# Patient Record
Sex: Male | Born: 1965
Health system: Southern US, Community
[De-identification: ages and names within clinical notes are randomized; demographics above are authoritative.]

## PROBLEM LIST (undated history)

## (undated) DIAGNOSIS — H35039 Hypertensive retinopathy, unspecified eye: Secondary | ICD-10-CM

## (undated) DIAGNOSIS — F329 Major depressive disorder, single episode, unspecified: Secondary | ICD-10-CM

## (undated) DIAGNOSIS — G4730 Sleep apnea, unspecified: Secondary | ICD-10-CM

## (undated) DIAGNOSIS — I2699 Other pulmonary embolism without acute cor pulmonale: Secondary | ICD-10-CM

## (undated) DIAGNOSIS — F32A Depression, unspecified: Secondary | ICD-10-CM

## (undated) DIAGNOSIS — I10 Essential (primary) hypertension: Secondary | ICD-10-CM

## (undated) DIAGNOSIS — H269 Unspecified cataract: Secondary | ICD-10-CM

## (undated) DIAGNOSIS — Z9889 Other specified postprocedural states: Secondary | ICD-10-CM

## (undated) DIAGNOSIS — R112 Nausea with vomiting, unspecified: Secondary | ICD-10-CM

## (undated) DIAGNOSIS — G709 Myoneural disorder, unspecified: Secondary | ICD-10-CM

## (undated) DIAGNOSIS — M4800 Spinal stenosis, site unspecified: Secondary | ICD-10-CM

## (undated) DIAGNOSIS — I82409 Acute embolism and thrombosis of unspecified deep veins of unspecified lower extremity: Secondary | ICD-10-CM

## (undated) DIAGNOSIS — M1990 Unspecified osteoarthritis, unspecified site: Secondary | ICD-10-CM

## (undated) DIAGNOSIS — D126 Benign neoplasm of colon, unspecified: Secondary | ICD-10-CM

## (undated) DIAGNOSIS — D689 Coagulation defect, unspecified: Secondary | ICD-10-CM

## (undated) DIAGNOSIS — I739 Peripheral vascular disease, unspecified: Secondary | ICD-10-CM

## (undated) DIAGNOSIS — E119 Type 2 diabetes mellitus without complications: Secondary | ICD-10-CM

## (undated) HISTORY — DX: Myoneural disorder, unspecified: G70.9

## (undated) HISTORY — DX: Acute embolism and thrombosis of unspecified deep veins of unspecified lower extremity: I82.409

## (undated) HISTORY — DX: Benign neoplasm of colon, unspecified: D12.6

## (undated) HISTORY — DX: Essential (primary) hypertension: I10

## (undated) HISTORY — DX: Hypertensive retinopathy, unspecified eye: H35.039

## (undated) HISTORY — DX: Unspecified osteoarthritis, unspecified site: M19.90

## (undated) HISTORY — PX: UPPER GASTROINTESTINAL ENDOSCOPY: SHX188

## (undated) HISTORY — DX: Unspecified cataract: H26.9

## (undated) HISTORY — PX: WISDOM TOOTH EXTRACTION: SHX21

## (undated) HISTORY — DX: Other pulmonary embolism without acute cor pulmonale: I26.99

## (undated) HISTORY — DX: Coagulation defect, unspecified: D68.9

## (undated) HISTORY — PX: SPINE SURGERY: SHX786

---

## 2000-11-28 ENCOUNTER — Emergency Department (HOSPITAL_COMMUNITY): Admission: EM | Admit: 2000-11-28 | Discharge: 2000-11-28 | Payer: Self-pay | Admitting: Emergency Medicine

## 2000-11-28 ENCOUNTER — Encounter: Payer: Self-pay | Admitting: Emergency Medicine

## 2003-08-31 ENCOUNTER — Emergency Department (HOSPITAL_COMMUNITY): Admission: EM | Admit: 2003-08-31 | Discharge: 2003-08-31 | Payer: Self-pay | Admitting: Emergency Medicine

## 2003-09-12 ENCOUNTER — Inpatient Hospital Stay (HOSPITAL_COMMUNITY): Admission: EM | Admit: 2003-09-12 | Discharge: 2003-09-22 | Payer: Self-pay | Admitting: Emergency Medicine

## 2003-09-12 ENCOUNTER — Encounter: Payer: Self-pay | Admitting: Emergency Medicine

## 2003-09-12 ENCOUNTER — Encounter: Payer: Self-pay | Admitting: Neurosurgery

## 2003-09-13 ENCOUNTER — Encounter: Payer: Self-pay | Admitting: Neurosurgery

## 2003-09-14 ENCOUNTER — Encounter: Payer: Self-pay | Admitting: Neurosurgery

## 2003-09-22 ENCOUNTER — Inpatient Hospital Stay (HOSPITAL_COMMUNITY)
Admission: RE | Admit: 2003-09-22 | Discharge: 2003-10-06 | Payer: Self-pay | Admitting: Physical Medicine & Rehabilitation

## 2003-10-07 ENCOUNTER — Encounter
Admission: RE | Admit: 2003-10-07 | Discharge: 2004-01-05 | Payer: Self-pay | Admitting: Physical Medicine & Rehabilitation

## 2003-10-16 ENCOUNTER — Inpatient Hospital Stay (HOSPITAL_COMMUNITY): Admission: EM | Admit: 2003-10-16 | Discharge: 2003-10-20 | Payer: Self-pay | Admitting: Emergency Medicine

## 2003-10-17 ENCOUNTER — Encounter: Payer: Self-pay | Admitting: Cardiology

## 2003-10-25 ENCOUNTER — Encounter: Admission: RE | Admit: 2003-10-25 | Discharge: 2003-10-25 | Payer: Self-pay | Admitting: Internal Medicine

## 2003-10-28 ENCOUNTER — Encounter: Admission: RE | Admit: 2003-10-28 | Discharge: 2003-10-28 | Payer: Self-pay | Admitting: Internal Medicine

## 2003-10-28 ENCOUNTER — Ambulatory Visit (HOSPITAL_COMMUNITY): Admission: RE | Admit: 2003-10-28 | Discharge: 2003-10-28 | Payer: Self-pay | Admitting: Internal Medicine

## 2003-11-04 ENCOUNTER — Encounter: Admission: RE | Admit: 2003-11-04 | Discharge: 2003-11-04 | Payer: Self-pay | Admitting: Internal Medicine

## 2003-11-08 ENCOUNTER — Encounter: Admission: RE | Admit: 2003-11-08 | Discharge: 2003-11-08 | Payer: Self-pay | Admitting: Internal Medicine

## 2003-11-15 ENCOUNTER — Encounter: Admission: RE | Admit: 2003-11-15 | Discharge: 2003-11-15 | Payer: Self-pay | Admitting: Internal Medicine

## 2003-12-02 ENCOUNTER — Encounter: Admission: RE | Admit: 2003-12-02 | Discharge: 2003-12-02 | Payer: Self-pay | Admitting: Internal Medicine

## 2003-12-20 ENCOUNTER — Encounter: Admission: RE | Admit: 2003-12-20 | Discharge: 2003-12-20 | Payer: Self-pay | Admitting: Internal Medicine

## 2004-01-06 ENCOUNTER — Encounter
Admission: RE | Admit: 2004-01-06 | Discharge: 2004-03-01 | Payer: Self-pay | Admitting: Physical Medicine & Rehabilitation

## 2004-01-10 ENCOUNTER — Encounter: Admission: RE | Admit: 2004-01-10 | Discharge: 2004-01-10 | Payer: Self-pay | Admitting: Internal Medicine

## 2004-01-31 ENCOUNTER — Encounter: Admission: RE | Admit: 2004-01-31 | Discharge: 2004-01-31 | Payer: Self-pay | Admitting: Internal Medicine

## 2004-02-28 ENCOUNTER — Encounter: Admission: RE | Admit: 2004-02-28 | Discharge: 2004-02-28 | Payer: Self-pay | Admitting: Internal Medicine

## 2004-03-07 ENCOUNTER — Encounter: Admission: RE | Admit: 2004-03-07 | Discharge: 2004-03-07 | Payer: Self-pay | Admitting: Internal Medicine

## 2004-03-20 ENCOUNTER — Encounter: Admission: RE | Admit: 2004-03-20 | Discharge: 2004-03-20 | Payer: Self-pay | Admitting: Internal Medicine

## 2004-03-28 ENCOUNTER — Encounter
Admission: RE | Admit: 2004-03-28 | Discharge: 2004-03-28 | Payer: Self-pay | Admitting: Physical Medicine & Rehabilitation

## 2004-04-04 ENCOUNTER — Encounter: Admission: RE | Admit: 2004-04-04 | Discharge: 2004-04-04 | Payer: Self-pay | Admitting: Internal Medicine

## 2004-04-18 ENCOUNTER — Encounter: Admission: RE | Admit: 2004-04-18 | Discharge: 2004-04-18 | Payer: Self-pay | Admitting: Internal Medicine

## 2004-05-09 ENCOUNTER — Encounter: Admission: RE | Admit: 2004-05-09 | Discharge: 2004-05-09 | Payer: Self-pay | Admitting: Internal Medicine

## 2004-08-03 ENCOUNTER — Ambulatory Visit: Payer: Self-pay | Admitting: Internal Medicine

## 2004-08-10 ENCOUNTER — Inpatient Hospital Stay (HOSPITAL_COMMUNITY): Admission: RE | Admit: 2004-08-10 | Discharge: 2004-08-11 | Payer: Self-pay | Admitting: Neurosurgery

## 2004-10-16 ENCOUNTER — Ambulatory Visit: Payer: Self-pay | Admitting: Internal Medicine

## 2004-10-26 ENCOUNTER — Ambulatory Visit (HOSPITAL_COMMUNITY): Admission: RE | Admit: 2004-10-26 | Discharge: 2004-10-26 | Payer: Self-pay | Admitting: Internal Medicine

## 2004-12-26 ENCOUNTER — Ambulatory Visit: Payer: Self-pay | Admitting: Internal Medicine

## 2007-01-05 ENCOUNTER — Emergency Department (HOSPITAL_COMMUNITY): Admission: EM | Admit: 2007-01-05 | Discharge: 2007-01-05 | Payer: Self-pay | Admitting: Emergency Medicine

## 2008-10-21 ENCOUNTER — Inpatient Hospital Stay (HOSPITAL_COMMUNITY): Admission: EM | Admit: 2008-10-21 | Discharge: 2008-10-22 | Payer: Self-pay | Admitting: Emergency Medicine

## 2009-04-21 ENCOUNTER — Ambulatory Visit (HOSPITAL_COMMUNITY): Admission: RE | Admit: 2009-04-21 | Discharge: 2009-04-21 | Payer: Self-pay | Admitting: Preventative Medicine

## 2009-04-21 ENCOUNTER — Emergency Department (HOSPITAL_COMMUNITY): Admission: EM | Admit: 2009-04-21 | Discharge: 2009-04-21 | Payer: Self-pay | Admitting: Emergency Medicine

## 2010-01-24 ENCOUNTER — Encounter
Admission: RE | Admit: 2010-01-24 | Discharge: 2010-01-24 | Payer: Self-pay | Admitting: Physical Medicine and Rehabilitation

## 2010-11-26 HISTORY — PX: OTHER SURGICAL HISTORY: SHX169

## 2011-04-10 NOTE — H&P (Signed)
Nicolas [name], Nicolas [name]                ACCOUNT NO.:  [id number]   MEDICAL RECORD NO.:  [id number]          PATIENT TYPE:  INP   LOCATION:  IC10                          FACILITY:  [facility]   PHYSICIAN:  Dorris Singh, DO    DATE OF BIRTH:  [birth year]   DATE OF ADMISSION:  10/21/2008  DATE OF DISCHARGE:  LH                              HISTORY & PHYSICAL   CHIEF COMPLAINT:  Drug overdose.   The patient is a 45 year old male who presented to EMS because of  unresponsiveness, level 5 caveat of 5.  He was brought to the [facility]  ED with possible overdose.  He was found unresponsive at home by his  wife.  She said there was empty bottle of amitriptyline 50 mg near him.  He also may have taken Diovan.  The patient has a history of chronic  back pain and may have taken a chronic pain medication as well.  EMS  gave him 2 mg of Naloxone without any change.  While he was in the  emergency room he was found to have respiratory distress and was  intubated.  He was then transferred to the ICU.   PAST MEDICAL HISTORY:  1. Back pain, chronic.  2. DVT.   PAST SURGICAL HISTORY:  Cervical disk surgery; decompressive laminectomy  at T2-T4 and T9-T11 in 2004.   He is a nonsmoker, non drinker.  No drug use.   ALLERGIES:  He has no known drug allergies.   Currently it says that he has no medications.  However, it is suspected  that he is on Diovan.  There is no way to verify any of this at this  point and time.   Vital signs on admission were blood pressure 202/102, pulse rate 122,  respirations 24.  Currently his vitals are as follows:  heart rate 97,  blood pressure 108/54 and pulse oximetry 100% on the ventilator.  A  review of systems we are unable to do, due to a level 5 caveat.  He is  unresponsive.  CONSTITUTIONAL:  The patient is morbidly obese.  HEENT:  Head is normocephalic, atraumatic.  Eyes currently are normal  appearance.  Ears, nose, mouth and throat all within normal limits.  HEART: Regular rate and rhythm.  RESPIRATORY:  Breath sounds are  currently are provided by mechanical ventilation.  ABDOMEN:  Round,  soft.  No masses noted.  GU:  Normal external genitalia.  CNS:  Cranial  nerves unable to assess.   His EKG in the emergency room -- He had a rate of 138 beats per minute,  sinus tachy; also Q-waves inferiorly suggest possible old inferior  myocardial infarction.  ABG:  pH 7.393, pCO2 38, pO2 286, bicarbonate  22.8.  Urine was negative.  Urine drug screen was negative for opiates,  cocaine, benzodiazepines, amphetamines, cannabis and barbiturates.  Alcohol level was less than 5.  Coumadin level was less than 10.  Sodium  134, potassium 3.6, chloride 101, carbon dioxide 24, glucose 114, BUN  11, creatinine 0.81.  His chest x-ray shows poor inspiration with mild  left  bibasilar atelectasis, mild cardiomegaly.  His white count was 9.8,  hemoglobin 15.2, hematocrit 45.8, platelet count of 191.   ASSESSMENT:  1. Drug overdose with possible amitriptyline and Diovan.  2. Hypertension.  3. Respiratory failure requiring sent ventilation.   PLAN:  Will admit the patient to the ICU to service of [facility].  He  has been placed on a ventilator in the ED; we will continue that.  Will  place him on a Diovan protocol.  Will do DVT and GI prophylaxis.  Will  continue to monitor his blood pressure and give IV antihypertensives as  needed.  When the patient is extubated we will have the ACT team be  involved in his care.  We will continue to monitor him and change  therapy as necessary.      Dorris Singh, DO  Electronically Signed     CB/MEDQ  D:  10/21/2008  T:  10/22/2008  Job:  [id number]

## 2011-04-10 NOTE — Consult Note (Signed)
Nicolas [name], Nicolas [name]                ACCOUNT NO.:  [id number]   MEDICAL RECORD NO.:  [id number]          PATIENT TYPE:  INP   LOCATION:  IC10                          FACILITY:  [facility]   PHYSICIAN:  Edward L. Juanetta Gosling, M.D.DATE OF BIRTH:  05/07/1966   DATE OF CONSULTATION:  DATE OF DISCHARGE:                                 CONSULTATION   REASON FOR CONSULTATION:  Respiratory failure.   HISTORY:  [name] is a 45 year old who came because of  unresponsiveness.  He was found unresponsive at home by his wife and was  noted to have an empty bottle of amitriptyline, and he also may have  taken Diovan and pain medication, although the exact situation on that  is not clear.  He was brought to the emergency room because of his  unresponsiveness and found to have some respiratory distress when was  intubated.  He is currently intubated on the ventilator.   His past medical history is positive for chronic low back pain, history  of DVT in the past.   Surgically, he has had cervical disk surgery, laminectomy at T2 and T4,  and then at T9 and T11.   SOCIAL HISTORY:  He does not smoke.  He does not use any alcohol.  He  does not use any illicit drugs.   His home medications are unknown.   He is said to have no drug allergies.   All of this information is from his medical records.  We cannot get any  of this information from him at this point.   Physical examination shows that he is intubated on the ventilator.  He  is sedated.  He has a blood pressure of 121/62, pulse 95, and  temperature is 37.2 or 99 degrees Fahrenheit.  His INR yesterday -496,  today +296.  His weight is 151 kg and unchanged.  Pupils are reactive.  Nose and throat are clear.  His mucous membranes are moist.  His chest  shows some rhonchi bilaterally.  His heart is regular without gallop.  His abdomen is soft.  No masses felt.  Extremities showed no edema.   LABORATORY DATA:  Electrolytes were normal.  His glucose is  147 this  morning.  Yesterday, his sodium was 134.  His albumin is 3.4, bilirubin  was 1.6.  White blood count 12,400, hemoglobin 14.6, hematocrit 43.2.  Blood gas pO2 is 159, pCO2 is 35.9, pH 7.414.  This is on 65%, 14 tidal  of 600 and 5 of PEEP.  Acetaminophen was less than 10, ethanol less than  5, ammonia was 23, salicylate less than 4.  Urine drug screen for  tricyclic was positive.  In urine drug screen, the standard urine drug  screen was negative.   ASSESSMENT:  He has had a drug overdose probably with tricyclics.  Certainly, he has tricyclic in his system.  He is somewhat over  oxygenated at this point.  He is not in a point that we can wean him  because he is too high on FIO2.  My plan then would be to continue  trying to  wean his O2 and follow.      Edward L. Juanetta Gosling, M.D.  Electronically Signed     ELH/MEDQ  D:  10/22/2008  T:  10/22/2008  Job:  [id number]

## 2011-04-10 NOTE — Discharge Summary (Signed)
NAMESKYELAR, Nicolas [name]                ACCOUNT NO.:  [id number]   MEDICAL RECORD NO.:  [id number]          PATIENT TYPE:  INP   LOCATION:  IC10                          FACILITY:  [facility]   PHYSICIAN:  Skeet Latch, DO    DATE OF BIRTH:  [birth year]   DATE OF ADMISSION:  10/21/2008  DATE OF DISCHARGE:  LH                               DISCHARGE SUMMARY   DISCHARGE DIAGNOSES:  1..  Intentional drug overdose of amitriptyline  and Diovan.  1. History of hypertension.  2. Respiratory failure requiring ventilator management.  3. History of chronic back pain.  4. History of deep vein thrombosis.   BRIEF HOSPITAL COURSE:  This is a 45 year old African American male who  presented to [facility] unresponsive after a period of drug overdose.  The patient was found unresponsive at home by his wife and his empty  bottle of amitriptyline 50 mg bottle near his bedside.  The patient has  also taken Diovan.  The patient has a history of chronic back pain and  multiple back and neck surgeries.  In the field, EMS did give him  naloxone without any improvement.  In the emergency room, he was found  to be in respiratory distress and intubated.  The patient was  transferred to the ICU.  Initial EKG showed rate of 138 beats per minute  sounding tachycardia.  Some mild Q-waves in the inferior leads with  possible old infarction.  ABG showed a pH 7.393, pCO2 38, pO2 of 286,  bicarb 222.8.  Urine exam was unremarkable.  His urine drug screen was  negative for opiates, cocaine, Benzoin, phentermines, cannabis, or  barbiturates.  His alcohol level is less than 5.  Coumadin level is less  than 10.  Sodium 134, potassium 2.6, chloride 101, CO2 is 24, glucose  114, BUN 11, creatinine 0.81.  Chest x-ray showed poor inspiration with  mild left bibasilar atelectasis with mild cardiomegaly.  His white count  was 9.8, hemoglobin 15.2, hematocrit 45.8, platelet count 191,000.  Again, the patient was admitted to ICU.   Then, management was started.  The patient was placed on DVT, GI prophylaxis.  ACT team was consulted  regarding psychiatric care when the patient was more alert.  Pulmonology  was consulted.  Weaning protocol was started for possible weaning in 24  hours.  The patient was placed on IV beta-blocker for his blood pressure  and tachycardia.  The patient has been improving.  He seems to be waking  up.  Sedation was increased.  I spoke with his wife.  Wife was concerned  that all his doctors at [facility] wanted him transfer to  [facility] due to his physicians being at that facility and  also that she lives by [name].  I spoke with Dr. Cheree Ditto at  [facility]  He will accept the patient.  The patient will  be transferred to [facility] in stable condition.  His labs  on discharge ABG on FIO2 of 65 with a VT of 60 and a PEEP of 5 and a  rate 14, showed a pH 7.414, pCO2 35.9, pO2 of 159, bicarb 22.5, ammonia  level 23.  Sodium 135, potassium 3.7, chloride 108, CO2 is 23, glucose  147, BUN 7, creatinine 0.73, total bilirubin 1.6, alk phosphatase 51,  AST 27, ALT 40, total protein 6.7, albumin 3.4, calcium 8.6.  His white  count was  12.4, hemoglobin 14.6, hematocrit 43.2, platelet count 218.  His chest x-  ray showed poor expiration with mild left basilar atelectasis with mild  cardiomegaly.  Again, the patient will be discharged in stable condition  and will be discharged to [facility].      Skeet Latch, DO  Electronically Signed     SM/MEDQ  D:  10/22/2008  T:  10/22/2008  Job:  [id number]   cc:   Audery Amel, MD  [address]  [place], [place] [address]

## 2011-04-13 NOTE — Discharge Summary (Signed)
NAME:  Nicolas [name], Nicolas [name] NO.:  [id number]   MEDICAL RECORD NO.:  [id number]                   PATIENT TYPE:  INP   LOCATION:                                       FACILITY:  [facility]   PHYSICIAN:  Foye Clock, MD                   DATE OF BIRTH:  [birth year]   DATE OF ADMISSION:  10/16/2003  DATE OF DISCHARGE:  10/20/2003                                 DISCHARGE SUMMARY   CONTINUATION   PROCEDURES PERFORMED:  On October 16, 2003, the patient had a chest CT done  that showed extensive non-occlusive thrombosis throughout the deep veins in  the right thigh.  Also had extensive bilateral pulmonary emboli with large  central saddle embolus, and near-occlusive thrombus in the left lower lobe  pulmonary artery.  Asymmetric air space disease in the left lower lobe may  reflect early pulmonary infarction.   CONSULTATIONS:  1. [facility], Dr. Andee Lineman.  2. Dr. Shaune Leeks, Pharmacist.   ADMITTING HISTORY AND PHYSICAL EXAMINATION:  Nicolas [name] is a 45 year old  male with a history of back surgery that was done three weeks ago, who now  presents to the ER with complaints of chest pain and shortness of breath.  He was completely fine two days ago, and then one day prior to admission, on  the morning around 4:30 a.m., he noticed some chest pain on the left side  radiating to his back, a 10/10 in severity, increased breathing rate,  increased pain with movement, breathing, cough, and some arm movement.  He  thought that he had sore muscles, but the pain was constant until the night  prior to admission.  He tried [employer], which did not help.  He had  been having right leg swelling and numbness for about three weeks now, which  was related to his back problems, and felt would be improved after his back  surgery.  He did not notice any right leg pain, only numbness.  He has no  family history of DVT's.  He has noticed some mild cough and sore throat  since the night prior to admission.   PHYSICAL EXAMINATION:  VITAL SIGNS:  Temperature 99.1, pulse 122,  respiratory rate 30, blood pressure 142/71, O2 saturation 99% on four liters  nasal cannula.  GENERAL:  He is an obese male, but otherwise comfortable looking, in no  apparent distress.  HEENT:  Eyes show clear sclerae.  Pupils equal, round and reactive to light  and accommodation.  Extraocular movements are intact.  RESPIRATORY:  Normal breath sounds.  No rales, but bilateral wheezes are  present.  CARDIOVASCULAR:  Regular rate and rhythm.  No murmurs, rubs, or gallops.  No  S3 appreciated.  GI:  Positive bowel sounds, soft, nontender, nondistended.  EXTREMITIES:  Right pedal edema greater than left pedal edema.  Skin was  warm and dry.  Had 5/5 strength.  Able to move all extremities.  NEUROLOGIC:  Decreased sensation to the left plantar area and right foot and  right leg.  Deep tendon reflexes were positive and present.   ADMISSION LABORATORIES:  His sodium was 136, potassium 4.1.  His CL was 103,  BUN 12, creatinine 0.9, glucose 119.  White blood cell count was 16.6.  His  hemoglobin was 11.8, hematocrit 35.1, platelet count 289.  His amylase was  19.  His EKG was as mentioned above.  His chest CT was as mentioned above.   HOSPITAL COURSE:  1. The patient was admitted with obvious pulmonary embolus.  For this, the     patient was given supplemental oxygen to help with his oxygen saturation.     During the course, the patient began anticoagulation with Lovenox until     he had a therapeutic Coumadin.  Dr. __________ followed.  Because of the     size of the embolus, an echocardiogram to be read emergently by     cardiology was performed to evaluate for right heart strain and the     possibility of requiring TVA; however, his echocardiogram report did not     show any evidence of right strain, and, therefore, the patient was simply     managed on Lovenox and Coumadin  anticoagulation.  At the time of     discharge, the patient's oxygenation had improved; however, he was still     hypoxic on room air, and, therefore, the patient was set up for home     oxygen.  #2.  Cough.  On admission, the patient also had a history of cough.  For  that, a chest x-ray was obtained.  The chest x-ray did show some findings  that were concerning for atelectasis/infiltrate, and given his confusing  picture of possible left lower lobe infarction with cough and some fevers,  we started the patient on empiric community acquired pneumonia therapy.  Because the patient defervesced with the antibiotics, it was thought that  the patient would benefit from a 10-day course of antibiotics, and,  therefore, he was discharged on Avelox to be taken for seven days for a full  10-day course.  #3.  Low back pain.  The patient was asymptomatic throughout the hospital  course, and not requiring any further therapy or intervention.   DISCHARGE LABORATORIES:  His white blood cell count was 9.7, hemoglobin was  9.8, hematocrit was 29.5, platelet count was 329.  His PT was 16.2, INR was  1.5.   FOLLOW UP:  The patient was to follow up with Dr. Michaell Cowing on November 29th at  9 a.m. for management of his Coumadin therapy.                                                Foye Clock, MD    JH/MEDQ  D:  11/18/2003  T:  11/20/2003  Job:  [id number]

## 2011-04-13 NOTE — Discharge Summary (Signed)
NAME:  Nicolas [name], Nicolas [name]                          ACCOUNT NO.:  [id number]   MEDICAL RECORD NO.:  [id number]                   PATIENT TYPE:  IPS   LOCATION:  4010                                 FACILITY:  [facility]   PHYSICIAN:  Nicolas [name], M.D.                DATE OF BIRTH:  1966/09/21   DATE OF ADMISSION:  09/22/2003  DATE OF DISCHARGE:  10/06/2003                                 DISCHARGE SUMMARY   DISCHARGE DIAGNOSES:  1. Status post T2-T4 decompression/T9-T11 decompression and laminectomy     secondary to thoracic stenosis and myelopathy.  2. Constipation, resolved.  3. Obesity.   HISTORY OF PRESENT ILLNESS:  The patient is a 45 year old male admitted on  September 02, 2003 with progressive back pain secondary to work injury and  bladder urgency.  MRI revealed T1-to-L1 thoracic stenosis and myelopathy on  T3-T4 and T10 and T11.  The patient underwent T2-T4 decompression and T9  through T11 decompression and laminectomy on September 14, 2003 by Dr. Clydene [name].  The patient is presently on pain management and on Decadron  taper, presently on no DVT prophylaxis.  PT report at this time indicates  the patient can ambulate approximately 25 feet with rolling walker at MIN-  assist level, able to transfer sit-to-stand at MIN-assist level and bed  mobility at close supervision with rails.  Hospital course also significant  for hypoglycemia probably secondary to Decadron.   PAST MEDICAL HISTORY:  Old back injury.  Denies any diabetes, cardiovascular  disease or CVA.   PAST SURGICAL HISTORY:  None.   MEDICATIONS PRIOR TO ADMISSION:  Naprosyn, hydrocortisone and Flexeril.   PRIMARY CARE Nicolas [name]:  He goes to Urgent Care.   ALLERGIES:  None.   SOCIAL HISTORY:  The patient lives in [place] with wife.  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  Wife can assist.  Denies any tobacco.  No alcohol.  He has three children.   FAMILY HISTORY:  Noncontributory.   REVIEW OF  SYSTEMS:  Review of systems is significant for his back pain and  spasms.  No chest pain.  No shortness of breath.  No GI problems.   HOSPITAL COURSE:  Nicolas [name] was admitted to [facility] on September 22, 2003 for comprehensive inpatient rehabilitation,  where he received more than three hours of therapy daily.  Overall, Mr.  Dolphus [name] did progress very well during his 14-day stay in rehab.  He was  discharged at a modified-independent-to-MIN-assist level.  The patient's  surgical incision healed very well.  Sutures were discontinued and Steri-  Strips were applied.  At the time of discharge, surgical incision  demonstrated no signs of infection.  The patient was able to tolerate his  therapies and receive pain management as needed.  Decadron was tapered down  and it was finally discontinued on October 03, 2003.  The patient went on a  pass on the weekend prior to discharge without any complications.  The  patient's hospital course was significant for constipation.  He received  sorbitol as well as magnesium citrate as needed.  Constipation did resolve.  The patient also received Sudafed q.12 h. as needed for sinus congestion and  headache.  There were no other major issues that occurred while patient in  rehab except for slightly elevated blood pressure; patient probably will  eventually have to use blood pressure medication or use diet modifications.  The patient was placed on Lovenox 40 mg daily for DVT prophylaxis.  No  bleeding complication was noted.   Latest labs indicate his latest hemoglobin was 12.2, white blood cell count  was 15.3, probably secondary to Decadron, and platelet count 182,000; sodium  138, potassium 4.1, chloride 103, CO2 28, glucose 107, BUN of 60, creatinine  0.8, AST 42, ALT 126.  Urinalysis performed on September 22, 2003:  No growth  x1 day.   At time of discharge, all vitals were stable except for mild elevated blood  pressure of  145/77; respiratory rate was 20, pulse 109, temperature was  98.9.  PT report at the time of discharge indicated that the patient was  ambulating modified-independent 50 feet with rolling walker, could transfer  to stand, modified independently, bed mobility, modified independently.  He  was able to perform most ADLs at modified-independent supervision level.  Overall, the patient has progressed very well and met all goals.  The  patient's family received family education.  Overall, he is at supervision  level for BADLs from a wheelchair level and he is discharged home with his  wife.   DISCHARGE MEDICATIONS:  1. Oxycodone 5 to 10 mg every four to six hours as needed for pain.  2. Skelaxin 400-800 mg every six to eight hours as needed for spasms.  3. Oxycodone for pain management.  4. Tylenol.   SPECIAL DISCHARGE INSTRUCTIONS:  Observe back precautions.  Use walker.  No  smoking.  No drinking alcohol.  He is to have outpatient therapies, PT and  OT, at [facility] beginning on October 07, 2003 at 11  a.m.   FOLLOWUP:  Follow up with Dr. Phoebe [name] within two weeks, call for an  appointment.  Follow up with Urgent Care as needed to monitor blood  pressure.  Follow up with Dr. Ellwood [name] as needed.      Nicolas [name], P.A.                       Nicolas [name], M.D.    LH/MEDQ  D:  10/06/2003  T:  10/07/2003  Job:  [id number]   cc:   Nicolas [name], M.D.  [address] [place]  [place] [address]  Fax: [phone]   Nicolas [name], M.D.  [address]  [place]  [place] [address]  Fax: [phone]

## 2011-04-13 NOTE — Discharge Summary (Signed)
NAME:  [name], Nicolas                          ACCOUNT NO.:  [id number]   MEDICAL RECORD NO.:  [id number]                   PATIENT TYPE:  INP   LOCATION:  3040                                 FACILITY:  [facility]   PHYSICIAN:  Hewitt Shorts, M.D.            DATE OF BIRTH:  1966-01-22   DATE OF ADMISSION:  09/11/2003  DATE OF DISCHARGE:  09/22/2003                                 DISCHARGE SUMMARY   This is a discharge summary on a patient who was admitted and operated on by  Dr. Phoebe Perch.  He has declined to dictate the discharge summary, and he  instructed the medical records department to request that I dictate the  discharge summary on his patient.   ADMISSION HISTORY AND PHYSICAL EXAMINATION:  The patient is a 45 year old  man who was admitted by Dr. Phoebe Perch.  Details of that admission history and  physical examination are included in Dr. Doreen Beam admission note.  The  patient was also seen on the day of admission by Dr. Melvyn Novas from  the [facility] group and was seen in internal medicine  consultation by Dr. Crista Curb on September 16, 2003, regarding  hyperglycemia.   HOSPITAL COURSE:  The patient was admitted by Dr. Phoebe Perch.  He documented in  his operative report that the patient was a 45 year old gentleman with some  spontaneous __________ symptoms and a history of progressive difficulty  walking over a three- to four-week period with increasing spasticity and  some bowel and bladder incontinence and urgency.  He is found to have severe  stenosis at the T3-4 level and spondylosis changes as well at T10 and T11.   The patient was taken to surgery by Dr. Phoebe Perch on September 14, 2003, and his  operative report describes a T2-T4 decompressive thoracic laminectomy and a  T9-T11 decompressive thoracic laminectomy.  Following surgery the patient  made steady progress with his recovery.  He was seen in consultation by  physical therapy and occupational  therapy.  It was felt that he would  benefit from comprehensive inpatient rehabilitation.  The patient was able  to progressively mobilized using a rolling walker.  The patient explained  that he felt that he was improved as compared to prior to surgery.  The  patient had been started on Decadron by Dr. Phoebe Perch.  That was gradually  tapered.  The patient noted steady improvement in sensation through the  lower torso and lower extremities.  He continued with therapies and his  Decadron taper and was transferred on the 10th hospital day to the  rehabilitation service.   DISCHARGE DIAGNOSIS:  On the basis of Dr. Doreen Beam description and his  operative note was that of thoracic stenosis with myelopathy and thoracic  spondylosis.  Hewitt Shorts, M.D.    RWN/MEDQ  D:  10/27/2003  T:  10/28/2003  Job:  [id number]

## 2011-04-13 NOTE — Op Note (Signed)
NAME:  Nicolas [name], Nicolas [name]                          ACCOUNT NO.:  [id number]   MEDICAL RECORD NO.:  [id number]                   PATIENT TYPE:  INP   LOCATION:  3108                                 FACILITY:  [facility]   PHYSICIAN:  Clydene Fake, M.D.               DATE OF BIRTH:  January 30, 1966   DATE OF PROCEDURE:  09/14/2003  DATE OF DISCHARGE:                                 OPERATIVE REPORT   PREOPERATIVE DIAGNOSES:  1. Thoracic stenosis with myelopathy and cord changes in T3-4 and T10-11.  2. Spondylosis.   POSTOPERATIVE DIAGNOSES:  1. Thoracic stenosis with myelopathy and cord changes in T3-4 and T10-11.  2. Spondylosis.   PROCEDURE:  1. T2-4 (3 levels) decompressive thoracic laminectomy, microdissection with     the microscope.  2. T9-11 (2 levels) decompressive laminectomy, microdissection with the     microscope.   SURGEON:  Clydene Fake, M.D.   ASSISTANT:  Hewitt Shorts, M.D.   ANESTHESIA:  General endotracheal tube.   ESTIMATED BLOOD LOSS:  1 liter, no blood given.   COMPLICATIONS:  Dural rent at T4 repaired with ___________.   REASON FOR PROCEDURE:  The patient is a 45 year old gentleman who was having  some spontaneous Lhermitte's symptoms and a history of having progressive  trouble walking over a 3-4 week period becoming more and more spastic and  even having some bowel and bladder incontinence and urgency. The patient had  an MRI cervical, thoracic and lumbar spine showing whole spine congenital  stenosis with some more significant stenosis at C5-6, 6-7 but no cord change  there and then at T3-4 severe stenosis with cord change behind the bodies of  T3 and 4 and then other levels of spondylitic change but at T10 and 11 had  severe posterior spondylosis causing cord compression worse on the left  side, no definitive cord compression there. The patient did have a T10  sensory level and after discussing with the patient, we brought the patient  in for  decompressive laminectomies in the thoracic spine at both the T3-4  level and the T10-11 level.   DESCRIPTION OF PROCEDURE:  The patient was brought to the operating room,  general anesthesia was induced, the patient was placed in a prone position  and all pressure points padded. We were careful to make sure the patient's  neck was not hyperextended through the case. Markers were placed over the  back and AP thoracic x-ray obtained. The top marker was at 3-4 disk space  and the lower marker was at the 11-12 disk space. With the marker, we made  marks on the skin and prepped and draped the patient in a sterile fashion.  An incision was made for the first procedure over T3-4 from T3-4 incision  made in the skin. This was taken down to the fascia, hemostasis obtained  with Bovie cauterization and bipolar cauterization. The fascia  was then  incised and subperiosteal dissection was done over the spinous process of  the lamina and T2,3,4, and 5 were all exposed. Placed markers again and  another x-ray obtained confirming our positioning. Laminectomy was then  performed from the bottom of T2 to the top of T5 with Leksell rongeurs, high  speed drill and Kerrison punches. At the level of 3-4, things are extremely  tight, no fat plane at all, and the thickened ligament was stuck to the dura  and was removed as larger dural tear with some ____________ also ripped and  we could see some nerve rootlets. There was no way to close this primarily.  When we were finished, we placed Gelfoam over it. When we were finished with  the decompression, we had normal fat planes in the epidural space both  cephalad and caudally. We redressed the dural injury. We could see the  spinal cord underneath. A piece of Duragen was placed over the dural tear  since it could not be closed primarily. This incision was then closed with 0  Vicryl interrupted sutures, paraspinous muscles and then the fascial layer.  The  subcutaneous tissues closed with 0, 2-0 and 3-0 interrupted sutures and  skin was closed with a running 3-0 nylon. Attention was then taken to the  T10-11 space. When we did earlier films, we marked one level higher. The  incision was centered on this part and taken down to the deep fascia.  Hemostasis obtained with Bovie cauterization and subperiosteal dissection  was done from T9 to the top of T11. I took another x-ray with the marker and  ensured the marker was sitting over the T10-11 disk space. Laminectomy was  then performed removing all of T10 through the bottom of T9 and top of T11  decompressing the spinal cord,  a significant osteophytic ridge was pushing  on the left side of the spinal cord at T10-11 and again this was removed and  we were finished we could decompress the central canal and lateral margins.  Hemostasis was obtained with Gelfoam and thrombin and this was then  irrigated out with good hemostasis. The paraspinous muscles closed with  interrupted suture, the fascia closed with 0 Vicryl interrupted suture, the  subcutaneous tissue closed with 0, 2-0 and 3-0 Vicryl interrupted suture and  the skin closed with reverting 3-0 nylon suture. Note we brought the  microscope in for resection technique during the laminectomy during both  procedures. Please note the first procedure was a T2-5, that is 3 levels,  decompressive laminectomy (2-3, 3-4, 4-5). The patient was awakened from  anesthesia and transferred to the recovery room.                                               Clydene Fake, M.D.    JRH/MEDQ  D:  09/14/2003  T:  09/15/2003  Job:  [id number]

## 2011-04-13 NOTE — Op Note (Signed)
NAME:  Nicolas [name], Nicolas [name]                          ACCOUNT NO.:  [id number]   MEDICAL RECORD NO.:  [id number]                   PATIENT TYPE:  INP   LOCATION:  2899                                 FACILITY:  [facility]   PHYSICIAN:  Clydene Fake, M.D.               DATE OF BIRTH:  September 30, 1966   DATE OF PROCEDURE:  08/10/2004  DATE OF DISCHARGE:                                 OPERATIVE REPORT   PREOPERATIVE DIAGNOSIS:  Cervical stenosis, spondylosis and cord compression  and left-sided radiculopathy, opacification left  posterior longitudinal  ligament.   POSTOPERATIVE DIAGNOSIS:  Cervical stenosis, spondylosis and cord  compression and left-sided radiculopathy, opacification left posterior  longitudinal ligament.   OPERATION PERFORMED:  C6 corpectomy with fusion of C5-6 and 6-7 with  interbody expandable cage from a Depuy spanning from 5 to 7, fusion with  autograft through same incision.  Eagle anterior cervical plate C5 to 7,  microdissection with microscope.   SURGEON:  Clydene Fake, M.D.   ASSISTANT:  Cristi Loron, M.D.   ANESTHESIA:  General endotracheal tube.   ESTIMATED BLOOD LOSS:  200 mL.   BLOOD REPLACED:  None.   DRAINS:  None.   COMPLICATIONS:  None.   INDICATIONS FOR PROCEDURE:  The patient is a 45 year old gentleman who is  paraparetic and improving thereafter __________  thoracic cord compression.  He underwent decompressive laminectomy 10 months ago complicated by DVT  which off all medicines and doing well there and started having __________  problems and tingling right and left arm and MRI was done again showing the  compression of cervical cord several spondylitic changes 5-6 and 6-7, some  foraminal narrowing.  Patient brought in for decompression and fusion at  that level.   DESCRIPTION OF PROCEDURE:  The patient was brought to the operating room.  General anesthesia was induced.  Patient was placed in 10 pounds halter  traction and prepped and  draped in the usual sterile fashion.  Site of  incision was injected with 10 mL 1% lidocaine with epinephrine.  Incision  was then made from the midline to the anterior border of the  sternocleidomastoid muscle on the left side of the neck.  Incision taken  down to the platysma and hemostasis obtained with Bovie cautery.  The  platysma was incised with a Bovie and blunt dissection was taken to the  anterior cervical fascia to the anterior cervical spine.  Needles were  placed in the 4-5 and 5-6 interspaces and x-rays were obtained.  We could  see the needle in the 4-5 disk.  We could see the needle heading toward the  spine but could not actually see the 5-6 but because we had both needles in,  we confirmed our position at 5-6.  5-6 disk space was incised with a 15  blade as needles were removed.  Partial diskectomy performed.  I then  incised the  6-7 disk space and did partial diskectomy there.  The longus  colli muscles were reflected laterally on each side using a Bovie.  Self-  retaining retractors were then placed.  Diskectomy was then performed 5-6  and 6-7 and corpectomy  performed removing the body of C6.  All the bone  from this was saved, chopped up into small pieces and used later in the case  for the allograft fusion.  As we got down to the posterior cortex, we  revisited the disk spaces and brought the microscope in for microdissection.  We removed the posterior ligament and posterior osteophytes with the 1 and 2  mm Kerrison punches.  There was significant __________ compression and as we  removed this we then performed bilateral foraminotomies, first at the 6-7  level and then at the 5-6 level.  When we were finished, we had good  foraminotomies at both levels bilaterally and removed posterior ligament  that went cephalad and caudal from our exposure.  We then continued  finishing the corpectomy removing the posterior cortex and posterior  ligament of C6 by using the Kerrison  punches.  We widened our corpectomy  with a high speed drill.  We measured the width of corpectomy.  At the  narrowest point it was 12 mm so we used a 12 mm  Depuy expandable cage.  We  got appropriate size to meet from C5 to 7, adjust up to easily getting into  the space and then packed this cage with autograft bone from the C6  vertebral body.  We placed this cage __________ into the spine and then  cranked it open so the end plates of the cage went up to the end plates of  C5 and then C7 and then snugly fit and we expanded it slightly more to put  it under some tension.  Weights were removed from the traction and the cage  was in good position.  We checked posterior to the cage with a hook and  there was plenty of room between cage and dura.  We then fastened the  locking screw into the cage to keep it set at its __________.  An Eagle  anterior cervical plate was then placed over the anterior cervical spine.  Two screws were placed in the C5 and two in the C7.  These were tightened  down.  Lateral x-rays were obtained because of the patient's body habitus we  could see just the outline of the very faint screws and plates in C5 and the  outline of the top of the strut cage.  We did show that the instrumentation  started at C5 and went down showing that we should be in good position  clinically and we had good position of the cage, plate and screws.  Hemostasis throughout was obtained with Gelfoam and Thrombin, bipolar  cautery.  Gelfoam was irrigated out.  Irrigated and removed retractors, got  hemostasis with bipolar cautery and Gelfoam with Thrombin.  Irrigated and  removed the Gelfoam.  We had good hemostasis and then the platysma was  closed with 3-0 Vicryl interrupted suture and the subcutaneous tissue was  closed with the same.  Skin was closed with Benzoin and Steri-Strips. Dressing was placed.  The patient was placed in a hard cervical collar,  awakened from anesthesia and  transferred to recovery in stable condition.  Clydene Fake, M.D.    JRH/MEDQ  D:  08/10/2004  T:  08/10/2004  Job:  [id number]

## 2011-04-13 NOTE — H&P (Signed)
NAME:  Nicolas [name], Nicolas [name]                          ACCOUNT NO.:  [id number]   MEDICAL RECORD NO.:  [id number]                   PATIENT TYPE:  INP   LOCATION:  2899                                 FACILITY:  [facility]   PHYSICIAN:  Clydene Fake, M.D.               DATE OF BIRTH:  04-28-1966   DATE OF ADMISSION:  08/10/2004  DATE OF DISCHARGE:                                HISTORY & PHYSICAL   CHIEF COMPLAINT:  Tingling and numbness in the left arm.   HISTORY:  The patient is a 45 year old gentleman who was found to  paraparetic.  Found to have thoracic cord compression and underwent  decompressive thoracic laminectomy at that time.  Also seen to have some  cervical stenosis and this has been followed over the last year or so.  He  has been developing worsening tingling and numbness radiating down his left  arm.  Workup with MRI was done showing stenosis with some OPLL, spondylitic  changes with cord compression and foraminal narrowing worse on the left side  at 5-6 and 6-7.  The patient was brought in for decompression and fusion.   PAST MEDICAL HISTORY:  Significant for:  1.  Obesity.  2.  Resolving paraparesis.  3.  Thoracic laminectomy on September 05, 2003.  4.  Borderline hypertension.  5.  History of DVT.   PAST SURGICAL HISTORY:  As above.   SOCIAL HISTORY:  Does not smoke or drink.  Was a truck driver prior to  previous surgery.   FAMILY HISTORY:  Noncontributory.   REVIEW OF SYSTEMS:  Otherwise negative.   PHYSICAL EXAMINATION:  HEENT:  Unremarkable.  NECK:  Regular range of motion of the neck.  LUNGS:  Clear.  ABDOMEN:  Obese, soft and nontender.  EXTREMITIES:  Intact.  No edema.  NEUROLOGIC:  Gait is spastic.  Negative Spurling's.  Negative Lhermitte's.  No weakness in the upper extremities.  __________ doing well in the upper  extremities and diminished in the lower extremities.   ASSESSMENT AND PLAN:  Patient with improving myelopathy from his thoracic  cord  compression, developing some cervical symptoms from the cervical  stenosis.  The patient is admitted for decompression and fusion of the  cervical spine.                                                Clydene Fake, M.D.    JRH/MEDQ  D:  08/10/2004  T:  08/10/2004  Job:  [id number]

## 2011-04-13 NOTE — Discharge Summary (Signed)
NAME:  Nicolas [name], Nicolas [name]                          ACCOUNT NO.:  [id number]   MEDICAL RECORD NO.:  [id number]                   PATIENT TYPE:  INP   LOCATION:  4734                                 FACILITY:  [facility]   PHYSICIAN:  Madaline Guthrie, M.D.                 DATE OF BIRTH:  March 21, 1966   DATE OF ADMISSION:  10/16/2003  DATE OF DISCHARGE:  10/20/2003                                 DISCHARGE SUMMARY   DISCHARGE DIAGNOSES:  1. Pulmonary embolus, settle type.  2. Obesity.  3. Low back pain status post T2 through T4 decompression and T9 through T11     decompression and laminectomy in November 2004.   DISCHARGE MEDICATIONS:  1. Lovenox 140 mg subcutaneously q.12h.  2. Coumadin 15 mg 1 p.o. daily.  3. Avelox 400 mg 1 p.o. daily x 7 days.  4. Oxycodone 5 to 10 mg 1 p.o. q.4h. p.r.n.  5. Skelaxin 800 mg 1 p.o. q.6h. p.r.n.   CONDITION ON DISCHARGE:  Improved.   FOLLOW UP:  Coumadin Clinic with Dr. __________ on November 29 at 9 a.m.   PROCEDURES:  1. EKG on October 16, 2003, showing pathologic Q wave in lead III, T wave     inversion in lead III, and an S wave in lead I, ventricular rate of 120,     PR interval of 136, a QRS duration of  83, QTC 435, all findings     consistent with a pulmonary embolus.  2. October 17, 2003, 2-D echocardiogram performed showed overall left     ventricular systolic function with vigorous left ventricular ejection     fraction estimated between 65 and 75%.  Impressions of right heart     structures disease visualized; however, the right ventricle does not     appear dilated or hypokinetic.  3. October 17, 2003, there was portable chest x-ray that showed asymmetric     bibasilar areas of consolidation or atelectasis, left greater than right.   DICTATION ENDS AT THIS POINT.      Foye Clock, MD                         Madaline Guthrie, M.D.    JH/MEDQ  D:  11/18/2003  T:  11/20/2003  Job:  [id number]   cc:   Outpatient Clinic

## 2011-04-13 NOTE — Consult Note (Signed)
NAME:  Nicolas [name], Nicolas [name]                          ACCOUNT NO.:  [id number]   MEDICAL RECORD NO.:  [id number]                   PATIENT TYPE:  INP   LOCATION:  3040                                 FACILITY:  [facility]   PHYSICIAN:  Corinna L. Lendell Caprice, MD             DATE OF BIRTH:  12-Jun-1966   DATE OF CONSULTATION:  09/16/2003  DATE OF DISCHARGE:                                   CONSULTATION   REASON FOR CONSULTATION:  Hyperglycemia.   IMPRESSIONS/RECOMMENDATIONS:  1. Hyperglycemia is most likely secondary to high dose steroids.  I     recommend an ADA diet and sliding scale insulin coverage for now.  I     will, however, order a hemoglobin A1C.  At this point, I see no need for     outpatient diabetic medication, but I would recommend checking a glucose     as an outpatient once steroids are discontinued.  2. Hiccups, intractable.  Thorazine as needed.  3. Status post laminectomy.   HISTORY OF PRESENT ILLNESS:  Nicolas [name] is a very pleasant 45 year old white  male who has been admitted to the neurosurgical service after undergoing T2  through T4 decompressive thoracic laminectomy and T9 through 11  decompressive laminectomy.  This was because the patient had thoracic  stenosis and myelopathy with cord changes.  He has been on IV Decadron, and  his Accu-Chek's apparently were elevated, although the only recorded blood  sugars I see on the chart have mainly been in the 119 to 139 range, and this  is otherwise relatively incomplete for the past several days.  It is  therefore hard to see the trends.  On his laboratory work;  however, his  serum glucose measurements have ranged from 131 to 171.  The patient has no  history of diabetes, no history of polyuria or polydipsia.   PAST MEDICAL HISTORY:  None.   MEDICATIONS:  1. He is currently getting sliding scale insulin.  2. He is getting IV Decadron.  3. Pepcid.  4. Multivitamins.  5. Vitamin E.  6. Toradol.  7. Ancef.  8. Several  p.r.n. pain medications and antispasmodics.   SOCIAL HISTORY:  The patient does not smoke or drink.  He does not use  drugs.  He is married.   FAMILY HISTORY:  His father has diabetes.   REVIEW OF SYSTEMS:  As above, otherwise negative, all other systems  negative.   PHYSICAL EXAMINATION:  VITAL SIGNS:  He is afebrile and he has normal vital  signs.  GENERAL:  He is hiccuping.  He is an overweight black male otherwise in no  acute distress.  HEENT:  Normocephalic, atraumatic.  Pupils equal, round, reactive to light.  Sclerae nonicteric.  Moist mucous membranes.  NECK:  Supple, no thyromegaly.  LUNGS:  Clear to auscultation bilaterally without wheezes, rhonchi, or  rales.  CARDIOVASCULAR:  Regular rate and rhythm without murmurs,  rubs, or gallops.  ABDOMEN:  Normal bowel sounds, soft, nontender, nondistended.  GENITOURINARY:  Deferred.  RECTAL:  Deferred.  EXTREMITIES:  No cyanosis, clubbing, or edema.  SKIN:  No rash.  PSYCHIATRIC:  Normal affect.  NEUROLOGIC:  Alert and oriented.  Cranial nerves and sensory motor exam are  intact.  SKIN:  No rash.   LABORATORY DATA:  UA on September 13, 2003, revealed greater than 1000  glucose, 15 ketones.  His sodium is 138, potassium 4, chloride 104,  bicarbonate 28, glucose 171, BUN 22, creatinine 0.9, this was from  yesterday.  His transaminases are minimally elevated.  EKG showed normal  sinus rhythm with nonspecific changes.   I would like to thank Dr. Phoebe Perch for this consult, and I will follow up the  hemoglobin A1C results and make any further changes to my recommendations.                                               Corinna L. Lendell Caprice, MD    CLS/MEDQ  D:  09/16/2003  T:  09/17/2003  Job:  [id number]

## 2011-04-13 NOTE — Consult Note (Signed)
NAME:  Nicolas [name], Nicolas [name]                          ACCOUNT NO.:  [id number]   MEDICAL RECORD NO.:  [id number]                   PATIENT TYPE:  INP   LOCATION:  3041                                 FACILITY:  [facility]   PHYSICIAN:  Melvyn Novas, M.D.               DATE OF BIRTH:  May 02, 1966   DATE OF CONSULTATION:  DATE OF DISCHARGE:                                   CONSULTATION   HISTORY:  The patient was seen today after a 17-hour ER stay on September 12, 2003.  He presented here last night in the evening of September 11, 2003, and  was evaluated by Dr. Devoria Albe in the ER for presumably progressive back  pain.  He had previously been seen here by Dr. __________  on August 31, 2003, who suspected sciatica and initiated a further outpatient workup.  The  patient states that since last week of August, after a back injury at work,  he had been lifting weights as a truck driver, unloading, he developed  sudden back pain, almost like an electric shock sensation, and then felt  increasing stiffness.  Initially, he had noticed only a sciatica like lower-  extremity pain, unilaterally. Now he states he developed numbness in both  feet, ascending over the knees on the left side, into the mid thigh, on the  right lower extremity to the groin line.  He states that he is not  necessarily weak, but feels uncoordinated, and can not really feel where he  walks or measure the strength of his muscles.  The patient further noted to  me that he had made his physicians aware of being referred next Wednesday to  Dr. Hilda Lias since a C-spine MRI supposedly showed a slipped disk, and  obtainable films at [facility] are still pending.   PAST MEDICAL HISTORY:  Old back injury that was treated with PT in 1999, now  injury presumably to the thoracic or higher lumbar spine at the end of  August.  Otherwise, OSA, and borderline hypertension.   SOCIAL HISTORY:  The patient is a [employer] architect, nonsmoker,  nondrinker.   MEDICATIONS:  1. Flexeril.  2. Hydrocodone.  3. Naproxen.   ALLERGIES:  No known drug allergies.   PHYSICAL EXAMINATION:  VITAL SIGNS:  Blood pressure 140/80, blood pressure  temperature 98, heart rate 68, respiratory rate 16.  LUNGS:  Clear to auscultation.  No upper airway with high grade __________  .  HEART:  Regular rate and rhythm, no murmur.  GENERAL:  Obese.  EXTREMITIES:  No lower-extremity swelling, no flaccidity.  No bruises.  NEUROLOGIC:  Cranial nerves fully intact.  Pupils react equal to  accommodation.  Extraocular movements are fully intact and conjugate.  Visual fields bilaterally equal to bilateral simultaneous stimulation,  facial sensation and facial motor strength bilaterally equal.  Tongue and  uvula movement.  The neck is supple.  Upper extremity shows equal grip, but  the patient has discomfort with shoulder shrug.  His deep tendon reflexes  remain 2+ for the biceps and antebrachial reflex.  Lower extremities:  The  patient complains of calf cramping, and dorsiflexion on the left seems to be  weaker than on the right.  Plantar flexion would both be ranked at about 4  out of 5.  Muscle tone is decreased.  There is no clonus, and I can not  elicit a Babinski response on the left side.  There is almost no response on  the right side, downgoing.  The adduction in the hip seems to be intact.  Abduction weaker.  Sensory:  The patient describes loss of fine touch  sensory completely in the feet below the ankle line.  The bottom of his feet  is described as entirely numb to pinprick, vibrations, and temperature, as  is the space between his toes.  This ascend on the left lower extremity in  the median line towards the mid thigh level; on the right side, into the  groin.  He says that with sneezing or coughing, there is an electric  sensation running down his back.  He states that he has noted sensory loss  gradually below the umbilicus.  Also this is  not a clear-cut line  __________  .  He has also stated increasing urinary bladder control loss  over the last 10 days.  He can no longer climb stairs. He is unable to  stabilize his gait at home.  Coordination by finger-nose, however, is  intact.  No tremor, dystaxia or dysmetria.   ASSESSMENT:  1. Question of cervical myelopathy or cervical spine stenosis.  The patient     was referred to Dr. Hilda Lias for Wednesday this week.  I would     strongly suggest he gives the patient steroids IV and have neurosurgery     covering for Dr. Jeral Fruit evaluate him. If he has progressed in a presumed     cervical myelopathy, it might be of urgency to have him operatively     treated here.  2. I further requested to have films or at least the report from [facility] transferred here.  I do not think this is a conservative     neurologic treatment required.  The patient has failed muscle relaxants,     pain medication and p.o. steroids.                                               Melvyn Novas, M.D.    CD/MEDQ  D:  09/12/2003  T:  09/12/2003  Job:  [id number]

## 2011-08-28 LAB — BLOOD GAS, ARTERIAL
Acid-base deficit: 1.4
Acid-base deficit: 1.4
Bicarbonate: 22.5
Bicarbonate: 22.8
FIO2: 100
FIO2: 65
MECHVT: 600
MECHVT: 600
O2 Saturation: 99.1
O2 Saturation: 99.7
PEEP: 5
PEEP: 5
Patient temperature: 37
Patient temperature: 37
RATE: 14
RATE: 14
TCO2: 19.7
TCO2: 19.8
pCO2 arterial: 35.9
pCO2 arterial: 38.3
pH, Arterial: 7.393
pH, Arterial: 7.414
pO2, Arterial: 159 — ABNORMAL HIGH
pO2, Arterial: 286 — ABNORMAL HIGH

## 2011-08-28 LAB — COMPREHENSIVE METABOLIC PANEL
ALT: 40
ALT: 44
AST: 27
AST: 35
Albumin: 3.4 — ABNORMAL LOW
Albumin: 3.8
Alkaline Phosphatase: 51
Alkaline Phosphatase: 55
BUN: 11
BUN: 7
CO2: 23
CO2: 24
Calcium: 8.6
Calcium: 8.8
Chloride: 101
Chloride: 108
Creatinine, Ser: 0.73
Creatinine, Ser: 0.81
GFR calc Af Amer: >60
GFR calc Af Amer: >60
GFR calc non Af Amer: >60
GFR calc non Af Amer: >60
Glucose, Bld: 114 — ABNORMAL HIGH
Glucose, Bld: 147 — ABNORMAL HIGH
Potassium: 3.6
Potassium: 3.7
Sodium: 134 — ABNORMAL LOW
Sodium: 135
Total Bilirubin: 1.3 — ABNORMAL HIGH
Total Bilirubin: 1.6 — ABNORMAL HIGH
Total Protein: 6.7
Total Protein: 7.2

## 2011-08-28 LAB — TRICYCLICS SCREEN, URINE: TCA Scrn: POSITIVE — AB

## 2011-08-28 LAB — SALICYLATE LEVEL: Salicylate Lvl: <4

## 2011-08-28 LAB — DIFFERENTIAL
Basophils Absolute: 0
Basophils Absolute: 0
Basophils Relative: 0
Basophils Relative: 0
Eosinophils Absolute: 0
Eosinophils Absolute: 0.2
Eosinophils Relative: 0
Eosinophils Relative: 2
Lymphocytes Relative: 12
Lymphocytes Relative: 31
Lymphs Abs: 1.5
Lymphs Abs: 3.1
Monocytes Absolute: 0.5
Monocytes Absolute: 0.8
Monocytes Relative: 4
Monocytes Relative: 8
Neutro Abs: 10.3 — ABNORMAL HIGH
Neutro Abs: 5.7
Neutrophils Relative %: 58
Neutrophils Relative %: 83 — ABNORMAL HIGH

## 2011-08-28 LAB — CBC
HCT: 43.2
HCT: 45.8
Hemoglobin: 14.6
Hemoglobin: 15.2
MCHC: 33.2
MCHC: 33.8
MCV: 88.3
MCV: 88.8
Platelets: 191
Platelets: 218
RBC: 4.9
RBC: 5.17
RDW: 14.4
RDW: 14.4
WBC: 12.4 — ABNORMAL HIGH
WBC: 9.8

## 2011-08-28 LAB — RAPID URINE DRUG SCREEN, HOSP PERFORMED
Amphetamines: NOT DETECTED
Barbiturates: NOT DETECTED
Benzodiazepines: NOT DETECTED
Cocaine: NOT DETECTED
Opiates: NOT DETECTED
Tetrahydrocannabinol: NOT DETECTED

## 2011-08-28 LAB — ETHANOL: Alcohol, Ethyl (B): <5

## 2011-08-28 LAB — URINALYSIS, ROUTINE W REFLEX MICROSCOPIC
Bilirubin Urine: NEGATIVE
Glucose, UA: NEGATIVE
Hgb urine dipstick: NEGATIVE
Ketones, ur: NEGATIVE
Nitrite: NEGATIVE
Protein, ur: NEGATIVE
Specific Gravity, Urine: 1.02
Urobilinogen, UA: 0.2
pH: 6

## 2011-08-28 LAB — TRIGLYCERIDES: Triglycerides: 223 — ABNORMAL HIGH

## 2011-08-28 LAB — AMMONIA: Ammonia: 23

## 2011-08-28 LAB — ACETAMINOPHEN LEVEL: Acetaminophen (Tylenol), Serum: <10 — ABNORMAL LOW

## 2011-08-28 LAB — CHOLESTEROL, TOTAL: Cholesterol: 207 — ABNORMAL HIGH

## 2011-11-30 HISTORY — PX: SPINE SURGERY: SHX786

## 2011-12-11 ENCOUNTER — Ambulatory Visit: Payer: Self-pay | Admitting: Family Medicine

## 2011-12-11 DIAGNOSIS — Z0289 Encounter for other administrative examinations: Secondary | ICD-10-CM

## 2011-12-13 ENCOUNTER — Ambulatory Visit: Payer: Self-pay | Admitting: Family Medicine

## 2011-12-13 DIAGNOSIS — Z0289 Encounter for other administrative examinations: Secondary | ICD-10-CM

## 2012-03-04 ENCOUNTER — Encounter: Payer: Self-pay | Admitting: Family Medicine

## 2012-03-04 ENCOUNTER — Ambulatory Visit (INDEPENDENT_AMBULATORY_CARE_PROVIDER_SITE_OTHER): Payer: Medicare PPO | Admitting: Family Medicine

## 2012-03-04 VITALS — BP 144/80 | HR 92 | Temp 97.7°F | Resp 20 | Ht 69.0 in | Wt 339.0 lb

## 2012-03-04 DIAGNOSIS — Z86711 Personal history of pulmonary embolism: Secondary | ICD-10-CM

## 2012-03-04 DIAGNOSIS — Z7901 Long term (current) use of anticoagulants: Secondary | ICD-10-CM

## 2012-03-04 DIAGNOSIS — I10 Essential (primary) hypertension: Secondary | ICD-10-CM

## 2012-03-04 DIAGNOSIS — M48061 Spinal stenosis, lumbar region without neurogenic claudication: Secondary | ICD-10-CM

## 2012-03-04 DIAGNOSIS — R609 Edema, unspecified: Secondary | ICD-10-CM

## 2012-03-04 DIAGNOSIS — R600 Localized edema: Secondary | ICD-10-CM

## 2012-03-04 MED ORDER — TORSEMIDE 20 MG PO TABS: ORAL_TABLET | ORAL | Status: DC

## 2012-03-04 MED ORDER — FAMOTIDINE 20 MG PO TABS: 20.0000 mg | ORAL_TABLET | Freq: Two times a day (BID) | ORAL | Status: DC

## 2012-03-04 MED ORDER — DIAZEPAM 10 MG PO TABS: 10.0000 mg | ORAL_TABLET | Freq: Three times a day (TID) | ORAL | Status: DC | PRN

## 2012-03-04 MED ORDER — GABAPENTIN 300 MG PO CAPS: 300.0000 mg | ORAL_CAPSULE | Freq: Every evening | ORAL | Status: DC | PRN

## 2012-03-04 MED ORDER — METOPROLOL SUCCINATE ER 50 MG PO TB24: 50.0000 mg | ORAL_TABLET | Freq: Every day | ORAL | Status: DC

## 2012-03-04 MED ORDER — POTASSIUM CHLORIDE CRYS ER 20 MEQ PO TBCR: 20.0000 meq | EXTENDED_RELEASE_TABLET | Freq: Two times a day (BID) | ORAL | Status: DC

## 2012-03-04 NOTE — Patient Instructions (Addendum)
Seborrheic Dermatitis Seborrheic dermatitis involves pink or red skin with greasy, flaky scales. This is often found on the scalp, eyebrows, nose, bearded area, and on or behind the ears. It can also occur on the central chest. It often occurs where there are more oil (sebaceous) glands. This condition is also known as dandruff. When this condition affects a baby's scalp, it is called cradle cap. It may come and go for no known reason. It can occur at any time of life from infancy to old age. CAUSES  The cause is unknown. It is not the result of too little moisture or too much oil. In some people, seborrheic dermatitis flare-ups seem to be triggered by stress. It also commonly occurs in people with certain diseases such as Parkinson's disease or HIV/AIDS. SYMPTOMS   Thick scales on the scalp.   Redness on the face or in the armpits.   The skin may seem oily or dry, but moisturizers do not help.   In infants, seborrheic dermatitis appears as scaly redness that does not seem to bother the baby. In some babies, it affects only the scalp. In others, it also affects the neck creases, armpits, groin, or behind the ears.   In adults and adolescents, seborrheic dermatitis may affect only the scalp. It may look patchy or spread out, with areas of redness and flaking. Other areas commonly affected include:   Eyebrows.   Eyelids.   Forehead.   Skin behind the ears.   Outer ears.   Chest.   Armpits.   Nose creases.   Skin creases under the breasts.   Skin between the buttocks.   Groin.   Some adults and adolescents feel itching or burning in the affected areas.  DIAGNOSIS  Your caregiver can usually tell what the problem is by doing a physical exam. TREATMENT   Cortisone (steroid) ointments, creams, and lotions can help decrease inflammation.   Babies can be treated with baby oil to soften the scales, then they may be washed with baby shampoo. If this does not help, medicated  shampoos should work.   Adults can also use medicated shampoos.   Your caregiver may prescribe corticosteroid cream and shampoo containing an antifungal or yeast medicine (ketoconazole). Hydrocortisone or anti-yeast cream can be rubbed directly onto seborrheic dermatitis patches. Yeast does not cause seborrheic dermatitis, but it seems to add to the problem.  In infants, seborrheic dermatitis is often worst during the first year of life. It tends to disappear on its own as the child grows. However, it may return during the teenage years. In adults and adolescents, seborrheic dermatitis tends to be a long-lasting condition that comes and goes over many years. HOME CARE INSTRUCTIONS   Use prescribed medicines as directed.   In infants, do not aggressively remove the scales or flakes on the scalp with a comb or by other means. This may lead to hair loss.  SEEK MEDICAL CARE IF:   The problem does not improve from the medicated shampoos, lotions, or other medicines given by your caregiver.   You have any other questions or concerns.  Document Released: 11/12/2005 Document Revised: 11/01/2011 Document Reviewed: 04/03/2010 [facility] Patient Information 2012 [place], [place].     Edema Edema is an abnormal build-up of fluids in tissues. Because this is partly dependent on gravity (water flows to the lowest place), it is more common in the leg sand thighs (lower extremities). It is also common in the looser tissues, like around the eyes.  Painless swelling of the feet and ankles is common and increases as a person ages. It may affect both legs and may include the calves or even thighs. When squeezed, the fluid may move out of the affected area and may leave a dent for a few moments. CAUSES   Prolonged standing or sitting in one place for extended periods of time. Movement helps pump tissue fluid into the veins, and absence of movement prevents this, resulting in edema.   Varicose veins. The valves in  the veins do not work as well as they should. This causes fluid to leak into the tissues.   Fluid and salt overload.   Injury, burn, or surgery to the leg, ankle, or foot, may damage veins and allow fluid to leak out.   Sunburn damages vessels. Leaky vessels allow fluid to go out into the sunburned tissues.   Allergies (from insect bites or stings, medications or chemicals) cause swelling by allowing vessels to become leaky.   Protein in the blood helps keep fluid in your vessels. Low protein, as in malnutrition, allows fluid to leak out.   Hormonal changes, including pregnancy and menstruation, cause fluid retention. This fluid may leak out of vessels and cause edema.   Medications that cause fluid retention. Examples are sex hormones, blood pressure medications, steroid treatment, or anti-depressants.   Some illnesses cause edema, especially heart failure, kidney disease, or liver disease.   Surgery that cuts veins or lymph nodes, such as surgery done for the heart or for breast cancer, may result in edema.  DIAGNOSIS  Your caregiver is usually easily able to determine what is causing your swelling (edema) by simply asking what is wrong (getting a history) and examining you (doing a physical). Sometimes x-rays, EKG (electrocardiogram or heart tracing), and blood work may be done to evaluate for underlying medical illness. TREATMENT  General treatment includes:  Leg elevation (or elevation of the affected body part).   Restriction of fluid intake.   Prevention of fluid overload.   Compression of the affected body part. Compression with elastic bandages or support stockings squeezes the tissues, preventing fluid from entering and forcing it back into the blood vessels.   Diuretics (also called water pills or fluid pills) pull fluid out of your body in the form of increased urination. These are effective in reducing the swelling, but can have side effects and must be used only under  your caregiver's supervision. Diuretics are appropriate only for some types of edema.  The specific treatment can be directed at any underlying causes discovered. Heart, liver, or kidney disease should be treated appropriately. HOME CARE INSTRUCTIONS   Elevate the legs (or affected body part) above the level of the heart, while lying down.   Avoid sitting or standing still for prolonged periods of time.   Avoid putting anything directly under the knees when lying down, and do not wear constricting clothing or garters on the upper legs.   Exercising the legs causes the fluid to work back into the veins and lymphatic channels. This may help the swelling go down.   The pressure applied by elastic bandages or support stockings can help reduce ankle swelling.   A low-salt diet may help reduce fluid retention and decrease the ankle swelling.   Take any medications exactly as prescribed.  SEEK MEDICAL CARE IF:  Your edema is not responding to recommended treatments. SEEK IMMEDIATE MEDICAL CARE IF:   You develop shortness of breath or chest pain.  You cannot breathe when you lay down; or if, while lying down, you have to get up and go to the window to get your breath.   You are having increasing swelling without relief from treatment.   You develop a fever over 102 F (38.9 C).   You develop pain or redness in the areas that are swollen.   Tell your caregiver right away if you have gained 3 lb/1.4 kg in 1 day or 5 lb/2.3 kg in a week.  MAKE SURE YOU:   Understand these instructions.   Will watch your condition.   Will get help right away if you are not doing well or get worse.  Document Released: 11/12/2005 Document Revised: 11/01/2011 Document Reviewed: 06/30/2008 [facility] Patient Information 2012 [place], [place].

## 2012-03-04 NOTE — Progress Notes (Signed)
  Subjective:    Patient ID: Nicolas [name], male    DOB: November 01, 1966, 46 y.o.   MRN: [id number]  HPI  This 46 y.o. male is new to [facility], having been recently discharged from a rehab facility on 02/28/12  after spine surgery which was performed in [place], [place] in January 2013.. The pt reports  total of 6 back surgeries for spinal stenosis ( spine disorder first diagnosed in 2004). He has HTN  which he says is "new" after the surgery along with fluid retention and lower extremity edema. He has been  referred to Cardiology for evaluation but was told he had to establish with PCP prior to first appt.  He reports 4-pillow orthopnea without cough or SOB. His right leg swells more than the left; he wears  a brace on right knee because of hyperextension problem. Pt also has hx of pulmonary embolism  in summer 2012. He is on Coumadin and needs INR checked today; his dose was recently increased from  7.5 mg daily to 10 mg daily near the time of discharge from rehab.  Review of Systems  Constitutional: Negative for fever and unexpected weight change.  Respiratory: Negative for cough, chest tightness and shortness of breath.   Cardiovascular: Positive for leg swelling. Negative for chest pain and palpitations.  Gastrointestinal: Negative for blood in stool and anal bleeding.  Genitourinary: Negative for hematuria.  Musculoskeletal: Positive for arthralgias and gait problem.  Neurological: Negative for dizziness and headaches.  Hematological: Does not bruise/bleed easily.        Objective:   Physical Exam  Nursing note and vitals reviewed. Constitutional: He is oriented to person, place, and time. He appears well-developed and well-nourished. No distress.       Morbidly obese  HENT:  Head: Normocephalic and atraumatic.  Mouth/Throat: Oropharynx is clear and moist.  Eyes: Conjunctivae are normal. No scleral icterus.  Neck: Neck supple. No thyromegaly present.  Cardiovascular: Normal rate,  regular rhythm and normal heart sounds.  Exam reveals no gallop.   No murmur heard.      Heart sounds difficult to auscultate due to adiposity  Pulmonary/Chest: Effort normal and breath sounds normal. No respiratory distress. He has no wheezes. He has no rales.       BS distant at bases  Musculoskeletal: He exhibits edema.  Neurological: He is alert and oriented to person, place, and time. No cranial nerve deficit.  Skin: Skin is warm and dry. Rash noted.       Lower extremity skin slightly red and scaly/ dry  Psychiatric: He has a normal mood and affect. His behavior is normal. Thought content normal.          Assessment & Plan:   1. Chronic anticoagulation  Protime-INR; will advise about Coumadin dose  2. HTN (hypertension)  Stable; continue current meds pending eval by Cardiology; avoid excess salt intake  3. Lower leg edema  Cardiology eval pending; elevate legs for 30 minutes during the day

## 2012-03-05 ENCOUNTER — Telehealth: Payer: Self-pay | Admitting: *Deleted

## 2012-03-05 LAB — PROTIME-INR
INR: 2.64 — ABNORMAL HIGH (ref <1.50)
Prothrombin Time: 29 seconds — ABNORMAL HIGH (ref 11.6–15.2)

## 2012-03-05 NOTE — Telephone Encounter (Signed)
NIKKI WITH [facility], THE PTS CASE MANAGER CALLED TO ASK IF WE DID A "PTINR TEST" YESTERDAY. PLEASE CALL CASE MANAGER TO ADVISE  Alda Lea that we did to a PTINR on pt yesterday and is being reviewed.  She wanted to know if you wanted her to do her PTINR so pt does not have to come in?

## 2012-03-06 DIAGNOSIS — M48061 Spinal stenosis, lumbar region without neurogenic claudication: Secondary | ICD-10-CM | POA: Insufficient documentation

## 2012-03-06 DIAGNOSIS — I10 Essential (primary) hypertension: Secondary | ICD-10-CM | POA: Insufficient documentation

## 2012-03-06 DIAGNOSIS — Z86711 Personal history of pulmonary embolism: Secondary | ICD-10-CM | POA: Insufficient documentation

## 2012-03-06 DIAGNOSIS — R600 Localized edema: Secondary | ICD-10-CM | POA: Insufficient documentation

## 2012-03-06 NOTE — Progress Notes (Signed)
Quick Note:  Please call pt and advise that the following labs are abnormal...  Advise pt to reduce Coumadin dose to 7.5 mg daily. (Confirm that he is currently taking 10 mg daily; that is what it stated a office visit). ______

## 2012-03-07 ENCOUNTER — Telehealth: Payer: Self-pay

## 2012-03-07 NOTE — Telephone Encounter (Signed)
I do not see any record of pt's visit or lab in [facility]. Called and asked Dr Audria Nine who stated that lab had called pt to tell her to decrease coumadin to 7.5 mg QD instead of 10 mg pt had been taking. Dr Audria Nine gave VO for [facility] home care to check pt's PT/INR in one week and call us with results. [facility] w[facility] stated that pt has appt w/new cardiologist to manage her coumadin on 4/29, if we can just help manage it until then. Lowella Bandy stated that pt just got call today about new dose and will start the 7.5 tolday. Dr Audria Nine, will you please document that you had given this info, and then you can just close encounter. Let me know if you need for me to do anything else.  Mertha Baars 03/05/2012 12:59 PM Signed  Lowella Bandy WITH [facility], THE PTS CASE MANAGER CALLED TO ASK IF WE DID A "PTINR TEST" YESTERDAY. PLEASE CALL CASE MANAGER TO ADVISE  The above phone messages were started under the incorrect pt's chart (Terri Skellenger) and I have copied and pasted the documentation here into the correct chart.

## 2012-03-07 NOTE — Telephone Encounter (Signed)
This issue was discussed with Launa Flight, RN and verbal order given for PT/INR to be checked in 1 week; further monitoring should be done by Cardiology after pt establishes there. His INR was a little above therapeutic range (= 2.0) so I advised that he reduce Coumadin dose back to 7.5 mg daily.

## 2012-03-11 ENCOUNTER — Telehealth: Payer: Self-pay

## 2012-03-11 NOTE — Telephone Encounter (Signed)
Nikki from [facility] home care checked pt's PT/INR today and it was PT 48.7 and INR 4.9. He has been taking 7.5 mg QD of coumadin since we decreased it from 10 mg after last test. See last weeks lab (INR has inc even though dosage has been decreased). Please advise new dose of coumadin and when you would like Nikki to recheck INR.

## 2012-03-11 NOTE — Telephone Encounter (Signed)
Please advise to hold next dose, then resume 7.5 mg, alternating with 5 mg (7.5-5-7.5-5-etc). Recheck INR in one week.

## 2012-03-12 NOTE — Telephone Encounter (Signed)
Spoke with Lowella Bandy, gave her direstions for pt per [employer]. Verbalized understanding

## 2012-03-18 ENCOUNTER — Telehealth: Payer: Self-pay

## 2012-03-18 NOTE — Telephone Encounter (Signed)
Spoke with Lowella Bandy, she was given Coumadin directions listed below.

## 2012-03-18 NOTE — Telephone Encounter (Signed)
Pts nurse called and states pts PT 18.8 and his INR is 1.9. She needs instructions from Dr. Audria Nine about what dose he needs to be on. The current dose is 7 1/2mg   alternating 5 mg doses. Please advise

## 2012-03-18 NOTE — Telephone Encounter (Signed)
According to previous phone messages pt has cardiology appointment on 03/24/2012 who will soon be managing Coumadin.  Until recheck with cardiologist we will increase his coumadin to 7.5 mg Mon, Tues, Wed, Friday, Saturday, and 5 mg on Sunday and THursday.  Please inform pt of new regimen.

## 2012-03-18 NOTE — Telephone Encounter (Signed)
LMOM to call back

## 2012-03-24 ENCOUNTER — Encounter: Payer: Self-pay | Admitting: Cardiovascular Disease

## 2012-03-24 ENCOUNTER — Ambulatory Visit (INDEPENDENT_AMBULATORY_CARE_PROVIDER_SITE_OTHER): Payer: Medicare PPO | Admitting: Cardiovascular Disease

## 2012-03-24 VITALS — BP 149/77 | HR 97 | Ht 71.0 in | Wt 346.0 lb

## 2012-03-24 DIAGNOSIS — R600 Localized edema: Secondary | ICD-10-CM

## 2012-03-24 DIAGNOSIS — R609 Edema, unspecified: Secondary | ICD-10-CM

## 2012-03-24 NOTE — Patient Instructions (Signed)
Your physician recommends that you schedule a follow-up appointment in: 3 weeks.   Your physician has requested that you have an echocardiogram. Echocardiography is a painless test that uses sound waves to create images of your heart. It provides your doctor with information about the size and shape of your heart and how well your heart's chambers and valves are working. This procedure takes approximately one hour. There are no restrictions for this procedure.   Your physician has requested that you have a lower or upper extremity venous duplex. This test is an ultrasound of the veins in the legs or arms. It looks at venous blood flow that carries blood from the heart to the legs or arms. Allow one hour for a Lower Venous exam. Allow thirty minutes for an Upper Venous exam. There are no restrictions or special instructions.  You have been referred to the Coumadin clinic in our office. Please have Home Health fax lab results to [phone]--Attention:Coumadin Clinic

## 2012-03-24 NOTE — Assessment & Plan Note (Signed)
This is likely from chronic venous stasis with possible residual thrombus in right lower ext. Will get venous dopplers of both lower extremities. Will get echo to exclude LV systolic dysfunction. Will continue coumadin for life. We will establish in our coumadin clinic.  He will need to f/u with the physician who implanted DVT filter to have it removed. Will continue current dose of diuretic. Will see back in 3 weeks.

## 2012-03-24 NOTE — Progress Notes (Signed)
History of Present Illness: 46 yo male with history of HTN, PE on chronic coumadin therapy, spinal stenosis referred today for evaluation of lower extremity edema.  He was recently discharged from a rehab facility on 02/28/12 after spine surgery which was performed in [place], [place] in January 2013. The pt reports total of 6 back surgeries for spinal stenosis ( spine disorder first diagnosed in 2004). He has a history of DVT in the summer 2012. He has history of PE in 2005, 2008, 2010. He has been on coumadin since summer 2012. He had an IVC filter placed in December 2012 when his blood thinner was stopped He has newly diagnosed HTN which he says is "new" after the surgery along with fluid retention and lower extremity edema. He has been on Demadex 20 mg po BID for the past 2 months. He has not noticed any change in his swelling. This lower extremity edema started while in the hospital.   No chest pain or SOB.    Primary Care Physician: Dow Adolph    Past Medical History  Diagnosis Date  . Arthritis   . Clotting disorder   . Hypertension   . Pulmonary embolism     x 3 in 2005, 2008, 2010  . DVT (deep venous thrombosis)     Summer 2012    Past Surgical History  Procedure Date  . Spine surgery 11/30/11    Total 6 back surgeries    Current Outpatient Prescriptions  Medication Sig Dispense Refill  . diazepam (VALIUM) 10 MG tablet Take 1 tablet (10 mg total) by mouth every 8 (eight) hours as needed for anxiety.  60 tablet  0  . famotidine (PEPCID) 20 MG tablet Take 1 tablet (20 mg total) by mouth 2 (two) times daily.  60 tablet  1  . gabapentin (NEURONTIN) 300 MG capsule Take 1 capsule (300 mg total) by mouth at bedtime and may repeat dose one time if needed.  30 capsule  2  . metoprolol succinate (TOPROL-XL) 50 MG 24 hr tablet Take 1 tablet (50 mg total) by mouth daily.  30 tablet  2  . Oxycodone HCl 10 MG TABS       . potassium chloride SA (KLOR-CON M20) 20 MEQ tablet Take 1  tablet (20 mEq total) by mouth 2 (two) times daily.  60 tablet  2  . torsemide (DEMADEX) 20 MG tablet Take 1 tablet by mouth twice a day.  60 tablet  2  . warfarin (COUMADIN) 7.5 MG tablet         Allergies  Allergen Reactions  . Lisinopril     cough    History   Social History  . Marital Status: Legally Separated    Spouse Name: N/A    Number of Children: 3  . Years of Education: N/A   Occupational History  . Disability    Social History Main Topics  . Smoking status: Former Smoker -- 1.5 packs/day for 10 years    Types: Cigarettes    Quit date: 04/23/2001  . Smokeless tobacco: Not on file  . Alcohol Use: 0.5 oz/week    1 drink(s) per week  . Drug Use: No  . Sexually Active: Not on file   Other Topics Concern  . Not on file   Social History Narrative  . No narrative on file    Family History  Problem Relation Age of Onset  . Cancer Mother   . Hypertension Father   . Diabetes Father  Review of Systems:  As stated in the HPI and otherwise negative.   BP 149/77  Pulse 97  Ht 5\' 11"  (1.803 m)  Wt 346 lb (156.945 kg)  BMI 48.26 kg/m2  Physical Examination: General: Well developed, well nourished, NAD HEENT: OP clear, mucus membranes moist SKIN: warm, dry. No rashes. Neuro: No focal deficits Musculoskeletal: Muscle strength 5/5 all ext Psychiatric: Mood and affect normal Neck: No JVD, no carotid bruits, no thyromegaly, no lymphadenopathy. Lungs:Clear bilaterally, no wheezes, rhonci, crackles Cardiovascular: Regular rate and rhythm. No murmurs, gallops or rubs. Abdomen:Soft. Bowel sounds present. Non-tender.  Extremities: 2+ right lower extremity edema. 1 + left lower ext edema.   EKG: NSR, rate 97 bpm. Q waves lead II

## 2012-03-25 LAB — POCT INR: INR: 1

## 2012-03-26 ENCOUNTER — Ambulatory Visit: Payer: Self-pay | Admitting: Cardiology

## 2012-03-26 DIAGNOSIS — R600 Localized edema: Secondary | ICD-10-CM

## 2012-03-26 DIAGNOSIS — I82409 Acute embolism and thrombosis of unspecified deep veins of unspecified lower extremity: Secondary | ICD-10-CM

## 2012-03-26 DIAGNOSIS — Z7901 Long term (current) use of anticoagulants: Secondary | ICD-10-CM

## 2012-03-26 DIAGNOSIS — Z86711 Personal history of pulmonary embolism: Secondary | ICD-10-CM

## 2012-03-26 DIAGNOSIS — I824Y9 Acute embolism and thrombosis of unspecified deep veins of unspecified proximal lower extremity: Secondary | ICD-10-CM

## 2012-03-26 MED ORDER — WARFARIN SODIUM 5 MG PO TABS: 5.0000 mg | ORAL_TABLET | ORAL | Status: DC

## 2012-03-27 ENCOUNTER — Encounter: Payer: Self-pay | Admitting: Family Medicine

## 2012-04-01 LAB — POCT INR: INR: 1.7

## 2012-04-02 ENCOUNTER — Ambulatory Visit: Payer: Self-pay | Admitting: Cardiovascular Disease

## 2012-04-02 DIAGNOSIS — Z86711 Personal history of pulmonary embolism: Secondary | ICD-10-CM

## 2012-04-02 DIAGNOSIS — I824Y9 Acute embolism and thrombosis of unspecified deep veins of unspecified proximal lower extremity: Secondary | ICD-10-CM

## 2012-04-02 DIAGNOSIS — Z7901 Long term (current) use of anticoagulants: Secondary | ICD-10-CM

## 2012-04-02 DIAGNOSIS — R600 Localized edema: Secondary | ICD-10-CM

## 2012-04-07 ENCOUNTER — Ambulatory Visit (HOSPITAL_COMMUNITY): Payer: Medicare PPO | Attending: Cardiology

## 2012-04-07 ENCOUNTER — Other Ambulatory Visit (HOSPITAL_COMMUNITY): Payer: Self-pay | Admitting: Cardiology

## 2012-04-07 ENCOUNTER — Other Ambulatory Visit: Payer: Self-pay

## 2012-04-07 DIAGNOSIS — Z86711 Personal history of pulmonary embolism: Secondary | ICD-10-CM | POA: Insufficient documentation

## 2012-04-07 DIAGNOSIS — R609 Edema, unspecified: Secondary | ICD-10-CM

## 2012-04-07 DIAGNOSIS — I10 Essential (primary) hypertension: Secondary | ICD-10-CM | POA: Insufficient documentation

## 2012-04-07 DIAGNOSIS — Z86718 Personal history of other venous thrombosis and embolism: Secondary | ICD-10-CM | POA: Insufficient documentation

## 2012-04-07 DIAGNOSIS — R600 Localized edema: Secondary | ICD-10-CM

## 2012-04-07 MED ORDER — PERFLUTREN PROTEIN A MICROSPH IV SUSP
3.0000 mL | Freq: Once | INTRAVENOUS | Status: AC
  Administered 2012-04-07: 3 mL via INTRAVENOUS

## 2012-04-08 ENCOUNTER — Ambulatory Visit: Payer: Self-pay | Admitting: Internal Medicine

## 2012-04-08 DIAGNOSIS — I824Y9 Acute embolism and thrombosis of unspecified deep veins of unspecified proximal lower extremity: Secondary | ICD-10-CM

## 2012-04-08 DIAGNOSIS — Z86711 Personal history of pulmonary embolism: Secondary | ICD-10-CM

## 2012-04-08 DIAGNOSIS — Z7901 Long term (current) use of anticoagulants: Secondary | ICD-10-CM

## 2012-04-08 DIAGNOSIS — R600 Localized edema: Secondary | ICD-10-CM

## 2012-04-08 LAB — POCT INR: INR: 1.3

## 2012-04-10 ENCOUNTER — Telehealth: Payer: Self-pay | Admitting: Cardiovascular Disease

## 2012-04-10 NOTE — Telephone Encounter (Signed)
Spoke with pt and reviewed echo results with him.  

## 2012-04-10 NOTE — Telephone Encounter (Signed)
New Problem: ° ° ° °Patient returned your call.  Please call back. °

## 2012-04-14 ENCOUNTER — Encounter (INDEPENDENT_AMBULATORY_CARE_PROVIDER_SITE_OTHER): Payer: Medicare PPO

## 2012-04-14 ENCOUNTER — Ambulatory Visit (INDEPENDENT_AMBULATORY_CARE_PROVIDER_SITE_OTHER): Payer: Medicare PPO | Admitting: Cardiovascular Disease

## 2012-04-14 ENCOUNTER — Encounter: Payer: Self-pay | Admitting: Cardiovascular Disease

## 2012-04-14 VITALS — BP 136/72 | HR 82 | Resp 18 | Ht 71.0 in | Wt 359.4 lb

## 2012-04-14 DIAGNOSIS — Z86711 Personal history of pulmonary embolism: Secondary | ICD-10-CM

## 2012-04-14 DIAGNOSIS — O2230 Deep phlebothrombosis in pregnancy, unspecified trimester: Secondary | ICD-10-CM

## 2012-04-14 DIAGNOSIS — R609 Edema, unspecified: Secondary | ICD-10-CM

## 2012-04-14 DIAGNOSIS — R600 Localized edema: Secondary | ICD-10-CM

## 2012-04-14 DIAGNOSIS — Z86718 Personal history of other venous thrombosis and embolism: Secondary | ICD-10-CM

## 2012-04-14 NOTE — Assessment & Plan Note (Addendum)
Likely secondary to venous stasis disease and chronic DVT. Will continue coumadin for lifetime given his history of DVT, PE and immobility. He has an IVC filter in place. Continue diuretic at current dose. I will see him back in 6 months.

## 2012-04-14 NOTE — Patient Instructions (Signed)
Your physician wants you to follow-up in:  6 months. You will receive a reminder letter in the mail two months in advance. If you don't receive a letter, please call our office to schedule the follow-up appointment.   

## 2012-04-14 NOTE — Assessment & Plan Note (Signed)
He will need lifelong anti-coagulation. No evidence of DVT on todays lower extremity venous dopplers but this is a difficult study given his size and leg girth.

## 2012-04-14 NOTE — Progress Notes (Signed)
History of Present Illness: 46 yo male with history of HTN, PE on chronic coumadin therapy, spinal stenosis here today for follow up. I saw him as a new patient 03/24/12 for evaluation of lower extremity edema. He was recently discharged from a rehab facility on 02/28/12 after spine surgery which was performed at [facility] in [place], [place] in January 2013. The pt reports total of 6 back surgeries for spinal stenosis ( spine disorder first diagnosed in 2004). He has a history of DVT in the summer 2012. He has history of PE in 2005, 2008, 2010. He has been on coumadin since summer 2012. He had an IVC filter placed in December 2012 when his blood thinner was stopped.  He had newly diagnosed HTN after the surgery along with fluid retention and lower extremity edema. He had been on Demadex 20 mg po BID since February 2013.  The lower extremity edema started while in the hospital. No chest pain or SOB. I suspect that he has chronic venous stasis disease. I ordered repeat venous dopplers and an echo. His echo showed normal LV size and function with LVEF of 55-60% with mild LVH and mild dilation of the left atrium. His venous dopplers today showed no evidence of DVT.   He has no new complaints today. Still has swelling in the left leg. No chest pain or SOB. He discussed having his IVC filter removed but plans for upcoming back surgery so it was decided to leave it in place.   Primary Care Physician: Dow Adolph  Past Medical History  Diagnosis Date  . Arthritis   . Clotting disorder   . Hypertension   . Pulmonary embolism     x 3 in 2005, 2008, 2010  . DVT (deep venous thrombosis)     Summer 2012    Past Surgical History  Procedure Date  . Spine surgery 11/30/11    Total 6 back surgeries    Current Outpatient Prescriptions  Medication Sig Dispense Refill  . diazepam (VALIUM) 10 MG tablet Take 1 tablet (10 mg total) by mouth every 8 (eight) hours as needed for anxiety.  60 tablet   0  . famotidine (PEPCID) 20 MG tablet Take 1 tablet (20 mg total) by mouth 2 (two) times daily.  60 tablet  1  . gabapentin (NEURONTIN) 300 MG capsule Take 1 capsule (300 mg total) by mouth at bedtime and may repeat dose one time if needed.  30 capsule  2  . metoprolol succinate (TOPROL-XL) 50 MG 24 hr tablet Take 1 tablet (50 mg total) by mouth daily.  30 tablet  2  . Oxycodone HCl 10 MG TABS       . potassium chloride SA (KLOR-CON M20) 20 MEQ tablet Take 1 tablet (20 mEq total) by mouth 2 (two) times daily.  60 tablet  2  . torsemide (DEMADEX) 20 MG tablet Take 1 tablet by mouth twice a day.  60 tablet  2  . warfarin (COUMADIN) 5 MG tablet Take 1 tablet (5 mg total) by mouth as directed. Take as directed by coumadin clinic  50 tablet  1  . warfarin (COUMADIN) 7.5 MG tablet         Allergies  Allergen Reactions  . Lisinopril     cough    History   Social History  . Marital Status: Legally Separated    Spouse Name: N/A    Number of Children: 3  . Years of Education: N/A   Occupational  History  . Disability    Social History Main Topics  . Smoking status: Former Smoker -- 1.5 packs/day for 10 years    Types: Cigarettes    Quit date: 04/23/2001  . Smokeless tobacco: Not on file  . Alcohol Use: 0.5 oz/week    1 drink(s) per week  . Drug Use: No  . Sexually Active: Not on file   Other Topics Concern  . Not on file   Social History Narrative  . No narrative on file    Family History  Problem Relation Age of Onset  . Cancer Mother 14    Lung  . Hypertension Father   . Diabetes Father     Review of Systems:  As stated in the HPI and otherwise negative.   BP 136/72  Pulse 82  Resp 18  Ht 5\' 11"  (1.803 m)  Wt 359 lb 6.4 oz (163.023 kg)  BMI 50.13 kg/m2  Physical Examination: General: Well developed, well nourished, NAD HEENT: OP clear, mucus membranes moist SKIN: warm, dry. No rashes. Neuro: No focal deficits Musculoskeletal: Muscle strength 5/5 all  ext Psychiatric: Mood and affect normal Neck: No JVD, no carotid bruits, no thyromegaly, no lymphadenopathy. Lungs:Clear bilaterally, no wheezes, rhonci, crackles Cardiovascular: Regular rate and rhythm. No murmurs, gallops or rubs. Abdomen:Soft. Bowel sounds present. Non-tender.  Extremities: 2+ right  lower extremity edema. Trace edema left lower ext. Pulses are hard to palpate secondary to pt size.   Echo 04/07/12:  Left ventricle: The cavity size was normal. Wall thickness was increased in a pattern of mild LVH. Systolic function was normal. The estimated ejection fraction was in the range of 55% to 60%. Wall motion was normal; there were no regional wall motion abnormalities. Left ventricular diastolic function parameters were normal. - Left atrium: The atrium was mildly dilated.

## 2012-04-15 LAB — POCT INR: INR: 2.3

## 2012-04-16 ENCOUNTER — Ambulatory Visit: Payer: Self-pay | Admitting: Cardiovascular Disease

## 2012-04-16 ENCOUNTER — Encounter: Payer: Self-pay | Admitting: Cardiovascular Disease

## 2012-04-16 DIAGNOSIS — R600 Localized edema: Secondary | ICD-10-CM

## 2012-04-16 DIAGNOSIS — Z7901 Long term (current) use of anticoagulants: Secondary | ICD-10-CM

## 2012-04-16 DIAGNOSIS — I824Y9 Acute embolism and thrombosis of unspecified deep veins of unspecified proximal lower extremity: Secondary | ICD-10-CM

## 2012-04-16 DIAGNOSIS — Z86711 Personal history of pulmonary embolism: Secondary | ICD-10-CM

## 2012-04-16 NOTE — Progress Notes (Signed)
This encounter was created in error - please disregard.

## 2012-04-22 LAB — POCT INR: INR: 1.7

## 2012-04-23 ENCOUNTER — Ambulatory Visit: Payer: Self-pay | Admitting: Internal Medicine

## 2012-04-23 DIAGNOSIS — R600 Localized edema: Secondary | ICD-10-CM

## 2012-04-23 DIAGNOSIS — I824Y9 Acute embolism and thrombosis of unspecified deep veins of unspecified proximal lower extremity: Secondary | ICD-10-CM

## 2012-04-23 DIAGNOSIS — Z86711 Personal history of pulmonary embolism: Secondary | ICD-10-CM

## 2012-04-23 DIAGNOSIS — Z7901 Long term (current) use of anticoagulants: Secondary | ICD-10-CM

## 2012-04-24 ENCOUNTER — Encounter: Payer: Self-pay | Admitting: Family Medicine

## 2012-04-29 LAB — POCT INR: INR: 3.7

## 2012-04-30 ENCOUNTER — Ambulatory Visit: Payer: Self-pay | Admitting: Cardiovascular Disease

## 2012-04-30 DIAGNOSIS — Z7901 Long term (current) use of anticoagulants: Secondary | ICD-10-CM

## 2012-04-30 DIAGNOSIS — R600 Localized edema: Secondary | ICD-10-CM

## 2012-04-30 DIAGNOSIS — I824Y9 Acute embolism and thrombosis of unspecified deep veins of unspecified proximal lower extremity: Secondary | ICD-10-CM

## 2012-04-30 DIAGNOSIS — Z86711 Personal history of pulmonary embolism: Secondary | ICD-10-CM

## 2012-05-09 ENCOUNTER — Telehealth: Payer: Self-pay | Admitting: *Deleted

## 2012-05-09 NOTE — Telephone Encounter (Signed)
Spoke with Nicolas [name] at [facility] and pt moved his INR appt from Wednesday to Thursday due to appts. Then on Thursday he Cx home visit and moved it to Tuesday next week.

## 2012-05-09 NOTE — Telephone Encounter (Signed)
Telephoned pt and discussed his past INR readings, safety with him, and compliance. He states he's been busy with agencies in town to assist him with his finances. He states he can come into clinic today for INR check because of co-pay. He states he can only do it on Tuesday of next week with [facility] nurse. He denies any bleeding, change in meds or any problems. He is aware of risk of moving checks out past schedule.

## 2012-05-13 LAB — POCT INR: INR: 1.2

## 2012-05-14 ENCOUNTER — Ambulatory Visit: Payer: Self-pay | Admitting: Cardiology

## 2012-05-14 DIAGNOSIS — Z7901 Long term (current) use of anticoagulants: Secondary | ICD-10-CM

## 2012-05-14 DIAGNOSIS — R600 Localized edema: Secondary | ICD-10-CM

## 2012-05-14 DIAGNOSIS — I824Y9 Acute embolism and thrombosis of unspecified deep veins of unspecified proximal lower extremity: Secondary | ICD-10-CM

## 2012-05-14 DIAGNOSIS — Z86711 Personal history of pulmonary embolism: Secondary | ICD-10-CM

## 2012-05-20 ENCOUNTER — Ambulatory Visit: Payer: Self-pay | Admitting: Cardiology

## 2012-05-20 DIAGNOSIS — Z7901 Long term (current) use of anticoagulants: Secondary | ICD-10-CM

## 2012-05-20 DIAGNOSIS — R600 Localized edema: Secondary | ICD-10-CM

## 2012-05-20 DIAGNOSIS — Z86711 Personal history of pulmonary embolism: Secondary | ICD-10-CM

## 2012-05-20 DIAGNOSIS — I824Y9 Acute embolism and thrombosis of unspecified deep veins of unspecified proximal lower extremity: Secondary | ICD-10-CM

## 2012-05-20 LAB — POCT INR: INR: 2.5

## 2012-06-03 ENCOUNTER — Ambulatory Visit: Payer: Self-pay | Admitting: Cardiology

## 2012-06-03 DIAGNOSIS — R600 Localized edema: Secondary | ICD-10-CM

## 2012-06-03 DIAGNOSIS — Z7901 Long term (current) use of anticoagulants: Secondary | ICD-10-CM

## 2012-06-03 DIAGNOSIS — I824Y9 Acute embolism and thrombosis of unspecified deep veins of unspecified proximal lower extremity: Secondary | ICD-10-CM

## 2012-06-03 DIAGNOSIS — Z86711 Personal history of pulmonary embolism: Secondary | ICD-10-CM

## 2012-06-03 LAB — POCT INR: INR: 1.6

## 2012-06-03 MED ORDER — WARFARIN SODIUM 5 MG PO TABS: 5.0000 mg | ORAL_TABLET | ORAL | Status: DC

## 2012-06-12 ENCOUNTER — Encounter: Payer: Self-pay | Admitting: Family Medicine

## 2012-06-17 ENCOUNTER — Encounter: Payer: Self-pay | Admitting: Family Medicine

## 2012-07-16 ENCOUNTER — Telehealth: Payer: Self-pay

## 2012-07-16 NOTE — Telephone Encounter (Signed)
Called spoke with pt, overdue for Coumadin f/u.  Pt states he is currently in [facility] for blood clots and is presently on Heparin.  Pt states his anticipated d/c date is by 07/20/12.  Pt states he has trouble getting in and out of our office, explained to pt for our clinic to manage and monitor his Coumadin he must come into the clinic to have his INR checked.  Pt states he is going to check with his primary MD to see if she will follow.  Advised pt to call us back once d/c from hospital for appt or to notify us if he is going to have Coumadin monitored and managed elsewhere.  Pt agrees.

## 2012-07-24 ENCOUNTER — Telehealth: Payer: Self-pay

## 2012-07-24 NOTE — Telephone Encounter (Signed)
Pt would like for someone to contact him about being monitored for his anr levels for his cumodine rx. [phone]

## 2012-07-24 NOTE — Telephone Encounter (Signed)
Patient wants to know about another referral for a different Coumadin Clinic, he states he has a hard time getting in and out of the clinic at [facility], he has to park in a parking deck and has hard time getting up to the office. Please advise if you can refer to another coumadin clinic. Patient has had recurrence of his blood clots because he d/c his coumadin on his own. I advised him he can not d/c this medication and he needs management with a coumadin clinic. Please advise.

## 2012-07-27 NOTE — Telephone Encounter (Signed)
I do not think he can transfer to another clinic. I would advise that he get back to [facility] and let them restart Coumadin; he can discuss the problems he is having and perhaps they can arrange for Home Health Nurse to come to his home or suggest some other remedy to this problem. You have advised him correctly re: continuation of Coumadin.

## 2012-07-28 NOTE — Telephone Encounter (Signed)
I have called him unable to leave message for him # [phone]. Will try again later.

## 2012-07-29 ENCOUNTER — Ambulatory Visit (INDEPENDENT_AMBULATORY_CARE_PROVIDER_SITE_OTHER): Payer: Medicare PPO | Admitting: Family Medicine

## 2012-07-29 ENCOUNTER — Ambulatory Visit (INDEPENDENT_AMBULATORY_CARE_PROVIDER_SITE_OTHER): Payer: Medicare PPO | Admitting: Pharmacist

## 2012-07-29 VITALS — BP 148/74 | HR 139 | Temp 98.5°F | Resp 18 | Ht 69.0 in | Wt 357.0 lb

## 2012-07-29 DIAGNOSIS — I10 Essential (primary) hypertension: Secondary | ICD-10-CM

## 2012-07-29 DIAGNOSIS — R600 Localized edema: Secondary | ICD-10-CM

## 2012-07-29 DIAGNOSIS — Z7901 Long term (current) use of anticoagulants: Secondary | ICD-10-CM

## 2012-07-29 DIAGNOSIS — I82409 Acute embolism and thrombosis of unspecified deep veins of unspecified lower extremity: Secondary | ICD-10-CM

## 2012-07-29 DIAGNOSIS — Z86711 Personal history of pulmonary embolism: Secondary | ICD-10-CM

## 2012-07-29 DIAGNOSIS — I824Y9 Acute embolism and thrombosis of unspecified deep veins of unspecified proximal lower extremity: Secondary | ICD-10-CM

## 2012-07-29 DIAGNOSIS — E119 Type 2 diabetes mellitus without complications: Secondary | ICD-10-CM

## 2012-07-29 DIAGNOSIS — R609 Edema, unspecified: Secondary | ICD-10-CM

## 2012-07-29 LAB — POCT INR: INR: 2.5

## 2012-07-29 MED ORDER — INSULIN PEN NEEDLE 31G X 5 MM MISC: 1.0000 | Status: DC

## 2012-07-29 MED ORDER — TORSEMIDE 20 MG PO TABS: ORAL_TABLET | ORAL | Status: DC

## 2012-07-29 NOTE — Progress Notes (Signed)
46 yo man with h/o DVT who comes in with leg pain and swelling.  He was recently discharged after heparin and coumadin treatment.  INR this morning was 2.5 He has a [place]field filter.  He has a long h/o spinal stenosis with multiple operations both here and in [facility].  He was recently put on Levimir but needs needles.   His FBS is always 100 or thereabouts, and his 2 hour pc is 200 roughly.  Objective:  NAD, obese Chest:  Clear Heart: rate 100, regular, no murmur or gallop Ext: 3 + edema with no erythema, up to midthighs.  Assess: diabetes, edema  Plan:  followup with Dr. Audria Nine in 1 week. 1. Pedal edema  torsemide (DEMADEX) 20 MG tablet  2. Type 2 diabetes mellitus  Insulin Pen Needle 31G X 5 MM MISC

## 2012-07-30 ENCOUNTER — Telehealth: Payer: Self-pay

## 2012-07-30 NOTE — Telephone Encounter (Signed)
PT STATES HE WAS GIVEN PEN NEEDLES THAT WE PRESCRIBED AND HE DOESN'T HAVE ANY IDEA WHAT TO DO WITH THEM, HAVE NEVER SEEN BEFORE PLEASE CALL [phone]

## 2012-07-30 NOTE — Telephone Encounter (Signed)
Spoke w/pt who stated he has started back to the [place] clinic yesterday and is going back again next week. I gave pt suggestions from Dr Audria Nine and pt reported that he is working on getting someone to go w/him regularly, but that he will ask [facility] about it if it is a problem.

## 2012-07-31 MED ORDER — "INSULIN SYRINGE-NEEDLE U-100 28G X 1/2"" 0.3 ML MISC": Status: DC

## 2012-07-31 NOTE — Telephone Encounter (Signed)
He was recently put on Levimir but needs needles. His FBS is always 100 or thereabouts, and his 2 hour pc is 200 roughly.   Patient has the Levmir now and the needles [facility]  [address] [place] The needles were pen needles but he has vials, so the correct needle/syringes are sent for him and he is advised. Amy

## 2012-08-05 ENCOUNTER — Ambulatory Visit (INDEPENDENT_AMBULATORY_CARE_PROVIDER_SITE_OTHER): Payer: Medicare PPO

## 2012-08-05 ENCOUNTER — Ambulatory Visit: Payer: Medicare PPO | Admitting: Family Medicine

## 2012-08-05 DIAGNOSIS — R609 Edema, unspecified: Secondary | ICD-10-CM

## 2012-08-05 DIAGNOSIS — Z86711 Personal history of pulmonary embolism: Secondary | ICD-10-CM

## 2012-08-05 DIAGNOSIS — R600 Localized edema: Secondary | ICD-10-CM

## 2012-08-05 DIAGNOSIS — I824Y9 Acute embolism and thrombosis of unspecified deep veins of unspecified proximal lower extremity: Secondary | ICD-10-CM

## 2012-08-05 DIAGNOSIS — I82409 Acute embolism and thrombosis of unspecified deep veins of unspecified lower extremity: Secondary | ICD-10-CM

## 2012-08-05 DIAGNOSIS — Z7901 Long term (current) use of anticoagulants: Secondary | ICD-10-CM

## 2012-08-05 LAB — POCT INR: INR: 3.9

## 2012-08-06 ENCOUNTER — Telehealth: Payer: Self-pay

## 2012-08-06 ENCOUNTER — Ambulatory Visit (INDEPENDENT_AMBULATORY_CARE_PROVIDER_SITE_OTHER): Payer: Medicare PPO | Admitting: Family Medicine

## 2012-08-06 ENCOUNTER — Encounter: Payer: Self-pay | Admitting: Family Medicine

## 2012-08-06 VITALS — BP 134/80 | HR 94 | Temp 97.9°F | Resp 16 | Ht 68.5 in | Wt 350.4 lb

## 2012-08-06 DIAGNOSIS — G894 Chronic pain syndrome: Secondary | ICD-10-CM

## 2012-08-06 DIAGNOSIS — F32A Depression, unspecified: Secondary | ICD-10-CM

## 2012-08-06 DIAGNOSIS — F329 Major depressive disorder, single episode, unspecified: Secondary | ICD-10-CM

## 2012-08-06 DIAGNOSIS — E119 Type 2 diabetes mellitus without complications: Secondary | ICD-10-CM

## 2012-08-06 MED ORDER — DULOXETINE HCL 60 MG PO CPEP: 60.0000 mg | ORAL_CAPSULE | Freq: Every day | ORAL | Status: DC

## 2012-08-06 MED ORDER — INSULIN DETEMIR 100 UNIT/ML ~~LOC~~ SOLN: 30.0000 [IU] | Freq: Every day | SUBCUTANEOUS | Status: DC

## 2012-08-06 MED ORDER — OXYCODONE HCL 10 MG PO TABS: 10.0000 mg | ORAL_TABLET | ORAL | Status: DC | PRN

## 2012-08-06 MED ORDER — METFORMIN HCL 1000 MG PO TABS: 1000.0000 mg | ORAL_TABLET | Freq: Two times a day (BID) | ORAL | Status: DC

## 2012-08-06 NOTE — Telephone Encounter (Signed)
Pt saw Dr. Audria Nine today and he expressed interest in a Hoveround chair.  Pt has called [employer] and he is eligible, just need to have Dr. Audria Nine cqll [facility] and give them additional information.    [employer]  [phone].   Pt, Nicolas [name]  [phone]

## 2012-08-07 ENCOUNTER — Encounter: Payer: Self-pay | Admitting: Family Medicine

## 2012-08-07 NOTE — Progress Notes (Signed)
S: This 46 y.o. AA male  Has multiple chronic medical problems with a hx of DVT and Pulmonary embolism, on Coumadin. He stopped all medications 2 weeks ago with subsequent hospitalization for 2 weeks. He is now back on all medications but expresses frustration and fatigue of dealing with chronic illness and pain. Upon further questioning, pt admits being depressed. He feels like giving up sometimes. He explains frustration and difficulty encountered when going to [facility] for frequent coumadin checks; this is a financial strain for him. He finds the visits physically demanding also; he ambulates with a walker and has chronic pain associated with lumbar stenosis (s/p surgery).   Re: Type II DM- pt states last A1C in hospital = 6.8%. He does FSBS 3x/day with readings ~ 100. Denies hypoglycemia. Not following a meal plan but tries to eat sensibly. Lives alone (just broke up with girlfriend).  ROS: As per HPI. Pt acknowledges depressed feelings.   O:  Filed Vitals:   08/06/12 1021  BP: 134/80  Pulse: 94  Temp: 97.9 F (36.6 C)  Resp: 16   GEN: In NAD; Well nourished; obese. HENT: [place]/AT; EOMI with conj/scl clear. COR: RRR; petibial edema noted. LUNGS: Normal resp rate and effort. NEURO: A&O x 3; CNs intact.  Difficulty with ambulation with walker- difficulty arising from seated position. PYSCH: Tearful with flat affect.   BECK'S DEPRESSION INVENTORY : score= 34 (severe depression)  A/P:  1. Chronic pain disorder  RF: Oxycodone 10 mg 1 tab every 4-6 hours for back pain; have advised pt that  Pain Management referral would be appropriate D/C Gabapentin as it is ineffective for this pt.  2. Type II or unspecified type diabetes mellitus without mention of complication, not stated as uncontrolled - continue current medications with focus on better nutrition   3. Depression Trial Cymbalta to treat both chronic pain and depression RX: Cymbalta 60 mg 1 capsule every AM until follow-up at which  time dose will be increased to     120 mg/day   Have discussed motorized WC or [employer]-A-Round for this pt; it would improve his mobility, make office  visits more manageable and improve his mental health.

## 2012-08-09 ENCOUNTER — Telehealth: Payer: Self-pay

## 2012-08-09 NOTE — Telephone Encounter (Signed)
PATIENT CALLED TO ASK IF HE NEEDED TO DO ANYTHING ELSE WHILE WAITING FOR DR. Audria Nine TO GIVE A CALL TO [employer] IN REGARDS TO GETTING A HOUVEROUND CHAIR FOR IN NET WORK LOCATIONS. PLEASE CONTACT PATIENT AT HOME IF HE NEEDS TO DO ANYTHING ELSE. THANK YOU!

## 2012-08-10 NOTE — Telephone Encounter (Signed)
I called [employer] on 08/07/12 (automated system had me on hold waiting for a representative). I will attempt again on 08/11/2012.

## 2012-08-20 NOTE — Telephone Encounter (Signed)
I called [employer] today and they are experiencing problems with their systems. The service rep requested I call back at a later time.

## 2012-08-21 ENCOUNTER — Other Ambulatory Visit: Payer: Self-pay | Admitting: *Deleted

## 2012-08-21 MED ORDER — WARFARIN SODIUM 10 MG PO TABS: 10.0000 mg | ORAL_TABLET | Freq: Every day | ORAL | Status: DC

## 2012-08-28 ENCOUNTER — Ambulatory Visit (INDEPENDENT_AMBULATORY_CARE_PROVIDER_SITE_OTHER): Payer: Medicare PPO | Admitting: *Deleted

## 2012-08-28 DIAGNOSIS — I824Y9 Acute embolism and thrombosis of unspecified deep veins of unspecified proximal lower extremity: Secondary | ICD-10-CM

## 2012-08-28 DIAGNOSIS — R609 Edema, unspecified: Secondary | ICD-10-CM

## 2012-08-28 DIAGNOSIS — Z7901 Long term (current) use of anticoagulants: Secondary | ICD-10-CM

## 2012-08-28 DIAGNOSIS — I82409 Acute embolism and thrombosis of unspecified deep veins of unspecified lower extremity: Secondary | ICD-10-CM

## 2012-08-28 DIAGNOSIS — Z86711 Personal history of pulmonary embolism: Secondary | ICD-10-CM

## 2012-08-28 DIAGNOSIS — R600 Localized edema: Secondary | ICD-10-CM

## 2012-08-28 LAB — POCT INR: INR: 3.5

## 2012-08-28 MED ORDER — WARFARIN SODIUM 10 MG PO TABS: ORAL_TABLET | ORAL | Status: DC

## 2012-08-28 NOTE — Telephone Encounter (Signed)
Pt stated that someone already called and set up appt for this tomorrow at 11:00.

## 2012-08-28 NOTE — Telephone Encounter (Signed)
I have just received paperwork from [facility] that requires pt have a face-to-face meeting with me specifically for "a mobility examination". Please contact the pt and schedule this 30 -minute appointment. Thank you.

## 2012-08-29 ENCOUNTER — Encounter: Payer: Self-pay | Admitting: Family Medicine

## 2012-08-29 ENCOUNTER — Telehealth: Payer: Self-pay

## 2012-08-29 ENCOUNTER — Ambulatory Visit (INDEPENDENT_AMBULATORY_CARE_PROVIDER_SITE_OTHER): Payer: Medicare PPO | Admitting: Family Medicine

## 2012-08-29 VITALS — BP 140/80 | HR 98 | Temp 98.3°F | Resp 16 | Ht 71.0 in | Wt 355.6 lb

## 2012-08-29 DIAGNOSIS — Z029 Encounter for administrative examinations, unspecified: Secondary | ICD-10-CM

## 2012-08-29 DIAGNOSIS — T887XXA Unspecified adverse effect of drug or medicament, initial encounter: Secondary | ICD-10-CM

## 2012-08-29 DIAGNOSIS — F322 Major depressive disorder, single episode, severe without psychotic features: Secondary | ICD-10-CM

## 2012-08-29 DIAGNOSIS — E119 Type 2 diabetes mellitus without complications: Secondary | ICD-10-CM

## 2012-08-29 DIAGNOSIS — F329 Major depressive disorder, single episode, unspecified: Secondary | ICD-10-CM

## 2012-08-29 DIAGNOSIS — Z23 Encounter for immunization: Secondary | ICD-10-CM

## 2012-08-29 LAB — POCT GLYCOSYLATED HEMOGLOBIN (HGB A1C): Hemoglobin A1C: 6.6

## 2012-08-29 MED ORDER — SERTRALINE HCL 50 MG PO TABS: 50.0000 mg | ORAL_TABLET | Freq: Every day | ORAL | Status: DC

## 2012-08-29 NOTE — Progress Notes (Signed)
S: This 46 y.o.ale is here today for face-to-face evaluation for HOVEROUND mobility device. He has lumbar stenosis and has had 6 spine surgeries. He is morbidly obese and has great difficulty ambulating with a walker.   His MRADLs are impaired because of limited mobility; he cannot go to the toilet as needed so he keeps several urinal handy and only goes to the bathroom to defecate. He also uses a bedside commode. He has to use extenders and sock aide  to get dressed. Personal hygiene is limited; he uses a shower chair with his daughter's help.   A cane or walker does not provide the needed safety for this man; he falls at least once  A week and has poor balance. His lower extremity strength is   3/5. He has good upper extremity strength but has chronic neck pain; over time, using a walker will increase neck pain and lead to shoulder discomfort and weakness.    A manual wheelchair does not meet the pt's mobility needs because of his weight; he does not have the upper body strength to propel a manual wheelchair. As mentioned above, he has chronic neck pain which would be exacerbated by using a  manual wheelchair.   A scooter (POV) is not appropriate for pt's home because his home environment does not provide adequate access for maneuvering POV and is lacks postural stability.   The pt has the physical and mental abilities to operate a power wheelchair safely in the home. He is willing and motivated to use the power wheelchair.   RE: Type II DM- FSBS have been 90-130 off medications for 2 weeks. Wants to know if he can discontinue some or all Diabetes medications. RE: Cymbalta (prescribed last visit for Depression and chronic pain)- pt had vomiting every time he took the medication, even with food. He is willing to try a different medication to treat his depression.    O: Filed Vitals:   08/29/12 1114  BP: 140/80                                  Weight: 355 lbs 9.6 oz              BMI= 49.6    Pulse: 98                                          Height: 5\' 11"   Temp: 98.3 F (36.8 C)  Resp: 16                                          GEN: In NAD. HENT: /AT; EOMI, conj/sclerae clear. NECK: Decreased ROM with muscle spasms in shoulder area. COR: RRR, no m,g,r. Bilateral lower ext edema noted. LUNGS: CTA w/o wheezes or rales. MS: Upper extremities with good ROM and muscle tone in shoulders, elbows, wrists and hands.         Lower extremities with decreased ROM at hips, knees and ankles. Left hip, knee and ankle stiffness noted. NEURO: A&O x 3; CNs intact; Motor function in upper ext- 4+/5. Motor function in lower ext- 3/5 (pt has weak hip and knee flexors)       Gait is abnormal with left  leg circumduction and partial foot drop. Weight bearing mostly on right leg and only with aide of walker.       Balance without walker is fair-poor. Pt cannot ambulate without a walker.   Results for orders placed in visit on 08/29/12  POCT GLYCOSYLATED HEMOGLOBIN (HGB A1C)      Component Value Range   Hemoglobin A1C 6.6        A/P: 1. Encounter for administrative examinations  HOVEROUND mobility exam performed  2. Severe depression  Trial- RX: Sertraline 50 mg  3. Type II or unspecified type diabetes mellitus without mention of complication, not stated as uncontrolled  POCT glycosylated hemoglobin (Hb A1C) Pt will continue to hold Diabetes medication; nutrition emphasized- may have to restart Metformin (pt aware).  4. Medication side effect  D/C Cymbalta

## 2012-08-29 NOTE — Patient Instructions (Addendum)
Your Diabetes is under good control without your medications at this time. Continue with your current nutrition plan and check your sugars daily.  Medication for depression- Sertraline 50 mg   Take 1/2 tablet every morning for 1 week then increase to 1 tablet. Dose may need to be increased at your next visit or in the near future.  Keep your next scheduled appointment.

## 2012-09-24 ENCOUNTER — Ambulatory Visit: Payer: Medicare PPO | Admitting: Family Medicine

## 2012-09-26 ENCOUNTER — Ambulatory Visit (INDEPENDENT_AMBULATORY_CARE_PROVIDER_SITE_OTHER): Payer: Medicare PPO | Admitting: *Deleted

## 2012-09-26 ENCOUNTER — Ambulatory Visit (INDEPENDENT_AMBULATORY_CARE_PROVIDER_SITE_OTHER): Payer: Medicare PPO | Admitting: Family Medicine

## 2012-09-26 VITALS — BP 154/84 | HR 77 | Temp 98.5°F | Resp 16 | Ht 71.0 in | Wt 355.0 lb

## 2012-09-26 DIAGNOSIS — I8310 Varicose veins of unspecified lower extremity with inflammation: Secondary | ICD-10-CM

## 2012-09-26 DIAGNOSIS — Z7901 Long term (current) use of anticoagulants: Secondary | ICD-10-CM

## 2012-09-26 DIAGNOSIS — I10 Essential (primary) hypertension: Secondary | ICD-10-CM

## 2012-09-26 DIAGNOSIS — I82409 Acute embolism and thrombosis of unspecified deep veins of unspecified lower extremity: Secondary | ICD-10-CM

## 2012-09-26 DIAGNOSIS — Z86711 Personal history of pulmonary embolism: Secondary | ICD-10-CM

## 2012-09-26 DIAGNOSIS — I824Y9 Acute embolism and thrombosis of unspecified deep veins of unspecified proximal lower extremity: Secondary | ICD-10-CM

## 2012-09-26 DIAGNOSIS — M549 Dorsalgia, unspecified: Secondary | ICD-10-CM

## 2012-09-26 DIAGNOSIS — R600 Localized edema: Secondary | ICD-10-CM

## 2012-09-26 DIAGNOSIS — G8929 Other chronic pain: Secondary | ICD-10-CM

## 2012-09-26 DIAGNOSIS — I872 Venous insufficiency (chronic) (peripheral): Secondary | ICD-10-CM

## 2012-09-26 DIAGNOSIS — R609 Edema, unspecified: Secondary | ICD-10-CM

## 2012-09-26 LAB — COMPREHENSIVE METABOLIC PANEL
ALT: 32 U/L (ref 0–53)
AST: 26 U/L (ref 0–37)
Albumin: 3.8 g/dL (ref 3.5–5.2)
Alkaline Phosphatase: 54 U/L (ref 39–117)
BUN: 13 mg/dL (ref 6–23)
CO2: 26 mEq/L (ref 19–32)
Calcium: 8.9 mg/dL (ref 8.4–10.5)
Chloride: 104 mEq/L (ref 96–112)
Creat: 0.77 mg/dL (ref 0.50–1.35)
Glucose, Bld: 139 mg/dL — ABNORMAL HIGH (ref 70–99)
Potassium: 3.9 mEq/L (ref 3.5–5.3)
Sodium: 138 mEq/L (ref 135–145)
Total Bilirubin: 0.8 mg/dL (ref 0.3–1.2)
Total Protein: 7.3 g/dL (ref 6.0–8.3)

## 2012-09-26 LAB — POCT INR: INR: 3.3

## 2012-09-26 MED ORDER — OXYCODONE HCL 10 MG PO TABS: 10.0000 mg | ORAL_TABLET | ORAL | Status: DC | PRN

## 2012-09-26 MED ORDER — TORSEMIDE 20 MG PO TABS: ORAL_TABLET | ORAL | Status: DC

## 2012-09-26 MED ORDER — METOPROLOL SUCCINATE ER 50 MG PO TB24: 50.0000 mg | ORAL_TABLET | Freq: Every day | ORAL | Status: DC

## 2012-09-26 MED ORDER — POTASSIUM CHLORIDE CRYS ER 20 MEQ PO TBCR: 20.0000 meq | EXTENDED_RELEASE_TABLET | Freq: Two times a day (BID) | ORAL | Status: DC

## 2012-09-26 MED ORDER — AMMONIUM LACTATE 12 % EX CREA: TOPICAL_CREAM | CUTANEOUS | Status: DC | PRN

## 2012-09-26 NOTE — Patient Instructions (Signed)
Stasis Dermatitis Stasis dermatitis occurs when veins lose the ability to pump blood back to the heart (poor venous circulation). It causes a reddish-purple to brownish scaly, itchy rash on the legs. The rash comes from pooling of blood (stasis). CAUSES   This occurs because the veins do not work very well anymore or because pressure may be increased in the veins due to other conditions. With blood pooling, the increased pressure in the tiny blood vessels (capillaries) causes fluid to leak out of the capillaries into the tissue. The extra fluid makes it harder for the blood to feed the cells and get rid of waste products. SYMPTOMS   Stasis dermatitis appears as red, scaly, itchy patches on the legs. A yellowish or light brown discoloration is also present. Due to scratching or other injury, these patches can become an ulcer. This ulcer may remain for long periods of time. The ulcer can also become infected. Swelling of the legs is often present with stasis dermatitis. If the leg is swollen, this increases the risk of infection and further damage to the skin. Sometimes, intense itching, tingling, and burning occurs before signs of stasis dermatitis appear. You may find yourself scratching the insides of your ankles or rubbing your ankles together before the rash appears. After healing, there are often brown spots on the affected skin. DIAGNOSIS   Your caregiver makes this diagnosis based on an exam. Other tests may be done to better understand the cause. TREATMENT   If underlying conditions are present, they must be treated. Some of these conditions are heart failure, thyroid problems, poor nutrition, and varicose veins.  Cortisone creams and ointments applied to the skin (topically) may be needed, as well as medicine to reduce swelling in the legs (diuretics).   Compression stockings or an elastic wrap may also be needed to reduce swelling.   If there is an infection, antibiotic medicines may also be  used.  HOME CARE INSTRUCTIONS    Try to rest and raise (elevate) the affected leg above the level of the heart, if possible.   Burow's solution wet packs applied for 30 minutes, 3 times daily, will help the weepy rash. Stop using the packs before your skin gets too dry. You can also use a mixture of 3 parts white vinegar to 1 quart water.   Grease your legs daily with ointments, such as petroleum jelly, to fight dryness.   Avoid scratching or injuring the area.  SEEK IMMEDIATE MEDICAL CARE IF:    Your rash gets worse.   An ulcer forms.   You have an oral temperature above 102 F (38.9 C), not controlled by medicine.   You have any other severe symptoms.  Document Released: 02/21/2006 Document Revised: 02/04/2012 Document Reviewed: 04/03/2010 [employer] Patient Information 2013 [employer].    

## 2012-09-28 ENCOUNTER — Encounter: Payer: Self-pay | Admitting: Family Medicine

## 2012-09-28 NOTE — Progress Notes (Signed)
Quick Note:  Please notify pt that results are normal.   Provide pt with copy of labs. ______ 

## 2012-09-28 NOTE — Progress Notes (Signed)
S:  This pt is here for follow-up and medication refills; he has been out of two of his BP meds (Metoprolol and Demadex with K+ supplement). He now has his mobility device and has resumed visits at [facility]. He is in the process of getting a carrier to attach to back of car so he can transport the motorized wheelchair. He is interested in referral to Pain Management; however, he requests that this be delayed until January 2014. His finances will be more stable and he will be able to afford cash payments for the visits. Until then, he asks that RX for pain medication be provided by this clinic. He reports taking Oxycodone twice daily for chronic lumbar spine and leg pain.  O:  Filed Vitals:   09/26/12 1110  BP: 154/84  Pulse: 77  Temp: 98.5 F (36.9 C)  Resp: 16   GEN: In NAD; WN,WD. Groomed. Adolescent son accompanies pt. COR: RRR. LUNGS: Normal resp rate and effort. EXT: 1+ pretibial edema bilat.  SKIN: Right lower leg- distal 1/2 with hyperpigmented lichenified areas. NEURO: A&O x 3; CNs intact. Gait is slow and wide-based with a walker.  A/P: 1. HTN (hypertension)  Comprehensive metabolic panel RFs: Metoprolol, Demadex and potassium supplement  2. Chronic back pain  Ambulatory referral to Pain Clinic RX: Oxycodone HCl 10 mg   #60  0RFs  3. Venous stasis dermatitis  RX: Lac-Hydrin topical  4. Pedal edema  torsemide (DEMADEX) 20 MG tablet

## 2012-09-29 ENCOUNTER — Encounter: Payer: Self-pay | Admitting: *Deleted

## 2012-10-28 ENCOUNTER — Ambulatory Visit (INDEPENDENT_AMBULATORY_CARE_PROVIDER_SITE_OTHER): Payer: Medicare PPO | Admitting: Family Medicine

## 2012-10-28 ENCOUNTER — Encounter: Payer: Self-pay | Admitting: Family Medicine

## 2012-10-28 ENCOUNTER — Ambulatory Visit (INDEPENDENT_AMBULATORY_CARE_PROVIDER_SITE_OTHER): Payer: Medicare PPO | Admitting: *Deleted

## 2012-10-28 VITALS — BP 170/70 | HR 86 | Temp 98.3°F | Resp 16 | Ht 69.0 in | Wt 355.6 lb

## 2012-10-28 DIAGNOSIS — Z86711 Personal history of pulmonary embolism: Secondary | ICD-10-CM

## 2012-10-28 DIAGNOSIS — R600 Localized edema: Secondary | ICD-10-CM

## 2012-10-28 DIAGNOSIS — R609 Edema, unspecified: Secondary | ICD-10-CM

## 2012-10-28 DIAGNOSIS — Z760 Encounter for issue of repeat prescription: Secondary | ICD-10-CM

## 2012-10-28 DIAGNOSIS — Z7901 Long term (current) use of anticoagulants: Secondary | ICD-10-CM

## 2012-10-28 DIAGNOSIS — I82409 Acute embolism and thrombosis of unspecified deep veins of unspecified lower extremity: Secondary | ICD-10-CM

## 2012-10-28 DIAGNOSIS — G894 Chronic pain syndrome: Secondary | ICD-10-CM

## 2012-10-28 DIAGNOSIS — I824Y9 Acute embolism and thrombosis of unspecified deep veins of unspecified proximal lower extremity: Secondary | ICD-10-CM

## 2012-10-28 LAB — POCT INR: INR: 2.2

## 2012-10-28 MED ORDER — DIAZEPAM 10 MG PO TABS: 10.0000 mg | ORAL_TABLET | Freq: Three times a day (TID) | ORAL | Status: DC | PRN

## 2012-10-28 MED ORDER — OXYCODONE HCL 10 MG PO TABS: 10.0000 mg | ORAL_TABLET | ORAL | Status: DC | PRN

## 2012-10-28 NOTE — Progress Notes (Signed)
S:  Pt is here for medication refills for chronic pain; he is scheduled to see Pain Management Specialist on Nov 07, 2012. He has pain in lumbar spine radiating into left leg. He is ambulating today with quad cane (as opposed to walker). He has not had any falls. Current pain medication regimen is adequate. He does need refill for Diazepam which he has not had in several months. Pt says BP is elevated because of mobility pain today; he is complaint w/ all medications.   ROS: Negative for fatigue, change in weight, diaphoresis, CP or tightness, palpitations, edema, SOB or cough, complete loss of use of left leg, falls, syncope.  O:  Filed Vitals:   10/28/12 1351  BP: 170/70                                     BP (07/2012): 134/80  Pulse: 86  Temp: 98.3 F (36.8 C)  Resp: 16   GEN: In NAD; WN,WD obese male. HENT: [place]/AT; EOMI w/ clear conj and sclerae. Otherwise normal. COR: RRR.  LUNGS: Normal resp rate and effort. NEURO: A&O x 3; CNs intact. Gait w/ quad cane- slow, antalgic and wide-based (favoring left leg).  A/P:  1. Chronic pain disorder    RF: Oxycodone until seen at Pain Management.  2. Medication refill                  RX: Diazepam  10 mg  1 tab every 8 hours prn.

## 2012-10-28 NOTE — Patient Instructions (Addendum)
Your next appt will be in 2 months for Diabetes and HTN follow-up. You may want to invest in a blood pressure cuff for home use.

## 2012-11-28 ENCOUNTER — Ambulatory Visit (INDEPENDENT_AMBULATORY_CARE_PROVIDER_SITE_OTHER): Payer: Medicare PPO

## 2012-11-28 DIAGNOSIS — R609 Edema, unspecified: Secondary | ICD-10-CM

## 2012-11-28 DIAGNOSIS — Z7901 Long term (current) use of anticoagulants: Secondary | ICD-10-CM

## 2012-11-28 DIAGNOSIS — R600 Localized edema: Secondary | ICD-10-CM

## 2012-11-28 DIAGNOSIS — I82409 Acute embolism and thrombosis of unspecified deep veins of unspecified lower extremity: Secondary | ICD-10-CM

## 2012-11-28 DIAGNOSIS — I824Y9 Acute embolism and thrombosis of unspecified deep veins of unspecified proximal lower extremity: Secondary | ICD-10-CM

## 2012-11-28 DIAGNOSIS — Z86711 Personal history of pulmonary embolism: Secondary | ICD-10-CM

## 2012-11-28 LAB — POCT INR: INR: 2.8

## 2012-11-28 MED ORDER — WARFARIN SODIUM 10 MG PO TABS: ORAL_TABLET | ORAL | Status: DC

## 2012-12-29 ENCOUNTER — Ambulatory Visit (INDEPENDENT_AMBULATORY_CARE_PROVIDER_SITE_OTHER): Payer: Medicare PPO

## 2012-12-29 DIAGNOSIS — I82409 Acute embolism and thrombosis of unspecified deep veins of unspecified lower extremity: Secondary | ICD-10-CM

## 2012-12-29 DIAGNOSIS — Z7901 Long term (current) use of anticoagulants: Secondary | ICD-10-CM

## 2012-12-29 DIAGNOSIS — R600 Localized edema: Secondary | ICD-10-CM

## 2012-12-29 DIAGNOSIS — I824Y9 Acute embolism and thrombosis of unspecified deep veins of unspecified proximal lower extremity: Secondary | ICD-10-CM

## 2012-12-29 DIAGNOSIS — R609 Edema, unspecified: Secondary | ICD-10-CM

## 2012-12-29 DIAGNOSIS — Z86711 Personal history of pulmonary embolism: Secondary | ICD-10-CM

## 2012-12-29 LAB — POCT INR: INR: 3

## 2012-12-30 ENCOUNTER — Encounter: Payer: Self-pay | Admitting: Family Medicine

## 2012-12-30 ENCOUNTER — Ambulatory Visit (INDEPENDENT_AMBULATORY_CARE_PROVIDER_SITE_OTHER): Payer: Medicare PPO | Admitting: Family Medicine

## 2012-12-30 VITALS — BP 128/77 | HR 93 | Temp 98.0°F | Resp 16 | Ht 69.5 in | Wt 361.0 lb

## 2012-12-30 DIAGNOSIS — E119 Type 2 diabetes mellitus without complications: Secondary | ICD-10-CM

## 2012-12-30 DIAGNOSIS — F329 Major depressive disorder, single episode, unspecified: Secondary | ICD-10-CM

## 2012-12-30 DIAGNOSIS — Z760 Encounter for issue of repeat prescription: Secondary | ICD-10-CM

## 2012-12-30 DIAGNOSIS — L219 Seborrheic dermatitis, unspecified: Secondary | ICD-10-CM

## 2012-12-30 DIAGNOSIS — F32A Depression, unspecified: Secondary | ICD-10-CM

## 2012-12-30 LAB — POCT GLYCOSYLATED HEMOGLOBIN (HGB A1C): Hemoglobin A1C: 6.3

## 2012-12-30 MED ORDER — METOPROLOL SUCCINATE ER 50 MG PO TB24: 50.0000 mg | ORAL_TABLET | Freq: Every day | ORAL | Status: DC

## 2012-12-30 MED ORDER — SERTRALINE HCL 50 MG PO TABS: ORAL_TABLET | ORAL | Status: DC

## 2012-12-30 MED ORDER — KETOCONAZOLE 2 % EX SHAM: MEDICATED_SHAMPOO | CUTANEOUS | Status: DC

## 2012-12-30 NOTE — Patient Instructions (Addendum)
I recommend you contact your previous neurologist/ neurosurgeon for follow-up and further evaluation of lumbar disc disease/ chronic low back pain.  I will see you in 4 months; all medical problems from my standpoint are stable. Contact the office if your have any new concerns or questions.

## 2012-12-30 NOTE — Progress Notes (Signed)
S:  This 47 y.o. male is here for Type II DM follow-up; he checks FSBS 2-3 times a week (95-115). He denies hypoglycemic symptoms. Pt is compliant with all medications and has no side effects. He takes Sertraline 50 mg daily and requests increase dose of this medication to help with sleep. He has no behavior problems, agitation, irritability, SI/HI or worsening depression.   He has significant dandruff that is not responding to OTC shampoos or tea tree oil. He has flaky, itchy skin involving scalp and beard.   Pt has chronic low back pain (s/p multiple surgeries) and wants to see Neurology again. He was last seen by [facility] neurologist who discussed future evaluation to include myelogram. Back pain and numbness in legs and feet is persistent and disabling. (Pt agrees to contact previous treating neurologist for further evaluation).  ROS: As per HPI. Negative for CP or tightness, palpitations, SOB or DOE, HA, dizziness, weakness or syncope.  O:  Filed Vitals:   12/30/12 1345  BP: 128/77  Pulse: 93  Temp: 98 F (36.7 C)  Resp: 16   GEN: In NAD; WN,WD. Pt is obese. HENT: [place]/AT; EOMI w/ clear conj/ sclerae. Oroph moist and clear. SKIN: Scalp- diffuse flakiness. COR: RRR. LUNGS: Normal resp rate and effort. NEURO: A&O x 3; CNs intact. Gait is stiff and antalgic; pt walks w/ cane.   Results for orders placed in visit on 12/30/12  POCT GLYCOSYLATED HEMOGLOBIN (HGB A1C)      Component Value Range   Hemoglobin A1C 6.3      A/P:  1. Type II or unspecified type diabetes mellitus without mention of complication, not stated as uncontrolled  Disease stable and well-controlled. Continue current medications and self-management.  2. Seborrheic dermatitis  RX: Nizoral Shampoo   Use daily for 7- 10 days then use twice a week. Continue tea tree oil scalp treatment.  3. Depression  Increase Sertraline to 75 mg daily   (50 mg tab: Take 1 1/2 tabs daily)  4. Issue of repeat prescriptions      Meds ordered this encounter  Medications  . sertraline (ZOLOFT) 50 MG tablet    Sig: Take 1 1/2 tablets every day.    Dispense:  46 tablet    Refill:  5  . metoprolol succinate (TOPROL-XL) 50 MG 24 hr tablet    Sig: Take 1 tablet (50 mg total) by mouth daily.    Dispense:  30 tablet    Refill:  5  . ketoconazole (NIZORAL) 2 % shampoo    Sig: Apply topically 2 (two) times a week.    Dispense:  120 mL    Refill:  5

## 2012-12-31 ENCOUNTER — Encounter: Payer: Self-pay | Admitting: Family Medicine

## 2012-12-31 MED ORDER — "INSULIN SYRINGE-NEEDLE U-100 28G X 1/2"" 0.3 ML MISC": Status: DC

## 2013-01-01 ENCOUNTER — Other Ambulatory Visit: Payer: Self-pay | Admitting: Family Medicine

## 2013-01-01 DIAGNOSIS — M5136 Other intervertebral disc degeneration, lumbar region: Secondary | ICD-10-CM

## 2013-01-27 ENCOUNTER — Ambulatory Visit: Payer: Medicare PPO | Admitting: Family Medicine

## 2013-02-27 ENCOUNTER — Ambulatory Visit (INDEPENDENT_AMBULATORY_CARE_PROVIDER_SITE_OTHER): Payer: Medicare PPO | Admitting: *Deleted

## 2013-02-27 DIAGNOSIS — Z7901 Long term (current) use of anticoagulants: Secondary | ICD-10-CM

## 2013-02-27 DIAGNOSIS — R609 Edema, unspecified: Secondary | ICD-10-CM

## 2013-02-27 DIAGNOSIS — I82409 Acute embolism and thrombosis of unspecified deep veins of unspecified lower extremity: Secondary | ICD-10-CM

## 2013-02-27 DIAGNOSIS — Z86711 Personal history of pulmonary embolism: Secondary | ICD-10-CM

## 2013-02-27 DIAGNOSIS — R600 Localized edema: Secondary | ICD-10-CM

## 2013-02-27 LAB — POCT INR: INR: 3.8

## 2013-03-17 ENCOUNTER — Other Ambulatory Visit: Payer: Self-pay | Admitting: *Deleted

## 2013-03-17 MED ORDER — WARFARIN SODIUM 10 MG PO TABS: ORAL_TABLET | ORAL | Status: DC

## 2013-04-28 ENCOUNTER — Ambulatory Visit: Payer: Medicare PPO | Admitting: Family Medicine

## 2013-05-01 ENCOUNTER — Ambulatory Visit (INDEPENDENT_AMBULATORY_CARE_PROVIDER_SITE_OTHER): Payer: Medicare PPO | Admitting: *Deleted

## 2013-05-01 ENCOUNTER — Ambulatory Visit (INDEPENDENT_AMBULATORY_CARE_PROVIDER_SITE_OTHER): Payer: Medicare PPO | Admitting: Family Medicine

## 2013-05-01 ENCOUNTER — Encounter: Payer: Self-pay | Admitting: Family Medicine

## 2013-05-01 ENCOUNTER — Ambulatory Visit: Payer: Medicare PPO | Admitting: Family Medicine

## 2013-05-01 VITALS — BP 150/90 | HR 95 | Temp 98.9°F | Resp 16 | Ht 68.0 in | Wt 359.4 lb

## 2013-05-01 DIAGNOSIS — R600 Localized edema: Secondary | ICD-10-CM

## 2013-05-01 DIAGNOSIS — Z1212 Encounter for screening for malignant neoplasm of rectum: Secondary | ICD-10-CM

## 2013-05-01 DIAGNOSIS — I82409 Acute embolism and thrombosis of unspecified deep veins of unspecified lower extremity: Secondary | ICD-10-CM

## 2013-05-01 DIAGNOSIS — E119 Type 2 diabetes mellitus without complications: Secondary | ICD-10-CM

## 2013-05-01 DIAGNOSIS — Z1211 Encounter for screening for malignant neoplasm of colon: Secondary | ICD-10-CM

## 2013-05-01 DIAGNOSIS — Z139 Encounter for screening, unspecified: Secondary | ICD-10-CM

## 2013-05-01 DIAGNOSIS — R351 Nocturia: Secondary | ICD-10-CM

## 2013-05-01 DIAGNOSIS — R609 Edema, unspecified: Secondary | ICD-10-CM

## 2013-05-01 DIAGNOSIS — Z7901 Long term (current) use of anticoagulants: Secondary | ICD-10-CM

## 2013-05-01 DIAGNOSIS — Z86711 Personal history of pulmonary embolism: Secondary | ICD-10-CM

## 2013-05-01 LAB — POCT URINALYSIS DIPSTICK
Blood, UA: NEGATIVE
Glucose, UA: NEGATIVE
Ketones, UA: NEGATIVE
Leukocytes, UA: NEGATIVE
Nitrite, UA: NEGATIVE
Protein, UA: NEGATIVE
Spec Grav, UA: 1.025
Urobilinogen, UA: 4
pH, UA: 7

## 2013-05-01 LAB — POCT INR: INR: 2.2

## 2013-05-01 LAB — POCT GLYCOSYLATED HEMOGLOBIN (HGB A1C): Hemoglobin A1C: 6.1

## 2013-05-01 LAB — IFOBT (OCCULT BLOOD): IFOBT: POSITIVE

## 2013-05-01 MED ORDER — DIAZEPAM 10 MG PO TABS
10.0000 mg | ORAL_TABLET | Freq: Three times a day (TID) | ORAL | Status: DC | PRN
Start: 2013-05-01 — End: 2013-06-12

## 2013-05-01 MED ORDER — METFORMIN HCL 500 MG PO TABS: 500.0000 mg | ORAL_TABLET | Freq: Two times a day (BID) | ORAL | Status: DC

## 2013-05-01 MED ORDER — TRAMADOL HCL 50 MG PO TABS: 50.0000 mg | ORAL_TABLET | Freq: Three times a day (TID) | ORAL | Status: DC | PRN

## 2013-05-01 NOTE — Progress Notes (Signed)
S:  This 47 y.o. Male has Type II DM but he has been off Insulin and Metformin for several months. He states that I agreed to discontinue his medications; FSBS have been 100-110. He has not lost weight but is managing nutrition better. There have been no hypoglycemic episodes.   Pt has chronic LBP due to lumbar DDD. He was referred to Pain Management but not pleased with interaction there. He is not in agreement w/ the treatment plan which includes evaluation by mental health and PT. Pt requests a trial of Tramadol which was mentioned as a possible effective substitute for Oxycodone. He has not tried this medication in the past.   Pt c/o nocturia (3-4 times per night), onset within 3 months. Pt denies dysuria, hematuria or incontinence.  He feels incomplete emptying of bladder. Last prostate exam/DRE was years ago; no prostate abnormality noted at that times. Family Hx notable for father having prostate cancer.  Pt continues to have follow-up in the Coumadin clinic.   PMHx, Soc Hx, Fam Hx and Problem List reviewed.  O: Filed Vitals:   05/01/13 1504  BP: 150/90  Pulse: 95  Temp: 98.9 F (37.2 C)  Resp: 16   GEN: In NAD; WN,WD. Pt is very obese. HENT: [place]/AT. EOMI w/ clear conj/ sclerae. Otherwise unremarkable. COR: RRR. LUNGS: Normal resp rate and effort. GU: DRE- Rectum w/ small hemorrhoids; anal tone is normal, no stools in vault, prostate normal size/not tender. NEURO: A&O x 3; CNs intact. Nonfocal except pt walks w/ cane due to chronic LBP/lumbar DDD.   Results for orders placed in visit on 05/01/13  POCT GLYCOSYLATED HEMOGLOBIN (HGB A1C)      Result Value Range   Hemoglobin A1C 6.1    POCT URINALYSIS DIPSTICK      Result Value Range   Color, UA yellow     Clarity, UA clear     Glucose, UA neg     Bilirubin, UA small     Ketones, UA neg     Spec Grav, UA 1.025     Blood, UA neg     pH, UA 7.0     Protein, UA neg     Urobilinogen, UA 4.0     Nitrite, UA neg     Leukocytes, UA Negative    IFOBT (OCCULT BLOOD)      Result Value Range   IFOBT Positive     A/P: Type II or unspecified type diabetes mellitus without mention of complication, not stated as uncontrolled - Resume Metformin 500 mg 1 tablet twice a day w/ main meals. Weight reduction. Plan: Comprehensive metabolic panel, LDL cholesterol, direct, TSH, POCT glycosylated hemoglobin (Hb A1C), Microalbumin, urine  Severe obesity (BMI >= 40)  Nocturia - Plan: POCT urinalysis dipstick, check PSA (given Fam Hx of prostate disease).  Screening for colorectal cancer - Plan: IFOBT POC (occult bld, rslt in office). Pt will collect stool at home and return it to office to recheck for occult blood.  Chronic LBP- RX for Tramadol 50 mg 1 tablet every 8 hours prn pain.

## 2013-05-01 NOTE — Patient Instructions (Addendum)
Your have Diabetes but have been off all medications for several months. Your A1c=6.1% which is almost normal. You can manage the Diabetes by eating as healthy as possible and losing some weight.  It would be a good idea to consider starting back on the Metformin 500 mg 1 tablet twice a day with main meals.  This may help with weight reduction and get your A1c down below 6%.  For back pain- you have requested a trial of Tramadol which I can prescribe for you. It will be prescribed to replace Oxycodone for pain. Take 1 tablet every 8 hours for pain.  Your stool tested positive for blood today; you are to collect a stool specimen at home and return it for a second test. If that test shows blood also, you will need to see a GI specialist for further evaluation.

## 2013-05-02 LAB — COMPREHENSIVE METABOLIC PANEL
ALT: 54 U/L — ABNORMAL HIGH (ref 0–53)
AST: 40 U/L — ABNORMAL HIGH (ref 0–37)
Albumin: 3.8 g/dL (ref 3.5–5.2)
Alkaline Phosphatase: 67 U/L (ref 39–117)
BUN: 14 mg/dL (ref 6–23)
CO2: 25 mEq/L (ref 19–32)
Calcium: 9.4 mg/dL (ref 8.4–10.5)
Chloride: 105 mEq/L (ref 96–112)
Creat: 0.97 mg/dL (ref 0.50–1.35)
Glucose, Bld: 82 mg/dL (ref 70–99)
Potassium: 4.5 mEq/L (ref 3.5–5.3)
Sodium: 142 mEq/L (ref 135–145)
Total Bilirubin: 0.8 mg/dL (ref 0.3–1.2)
Total Protein: 7.5 g/dL (ref 6.0–8.3)

## 2013-05-02 LAB — PSA, MEDICARE: PSA: 1.09 ng/mL (ref <=4.00)

## 2013-05-02 LAB — MICROALBUMIN, URINE: Microalb, Ur: 1 mg/dL (ref 0.00–1.89)

## 2013-05-02 LAB — TSH: TSH: 2.156 u[IU]/mL (ref 0.350–4.500)

## 2013-05-02 LAB — LDL CHOLESTEROL, DIRECT: Direct LDL: 82 mg/dL

## 2013-05-04 ENCOUNTER — Other Ambulatory Visit: Payer: Self-pay | Admitting: Radiology

## 2013-05-04 DIAGNOSIS — K921 Melena: Secondary | ICD-10-CM

## 2013-05-04 LAB — IFOBT (OCCULT BLOOD): IFOBT: NEGATIVE

## 2013-05-04 NOTE — Addendum Note (Signed)
Addended by: Marinus Maw on: 05/04/2013 04:28 PM   Modules accepted: Orders

## 2013-05-06 ENCOUNTER — Encounter: Payer: Self-pay | Admitting: Family Medicine

## 2013-05-28 ENCOUNTER — Ambulatory Visit (INDEPENDENT_AMBULATORY_CARE_PROVIDER_SITE_OTHER): Payer: Medicare PPO | Admitting: *Deleted

## 2013-05-28 DIAGNOSIS — Z7901 Long term (current) use of anticoagulants: Secondary | ICD-10-CM

## 2013-05-28 DIAGNOSIS — I82409 Acute embolism and thrombosis of unspecified deep veins of unspecified lower extremity: Secondary | ICD-10-CM

## 2013-05-28 DIAGNOSIS — R609 Edema, unspecified: Secondary | ICD-10-CM

## 2013-05-28 DIAGNOSIS — R600 Localized edema: Secondary | ICD-10-CM

## 2013-05-28 DIAGNOSIS — Z86711 Personal history of pulmonary embolism: Secondary | ICD-10-CM

## 2013-05-28 LAB — POCT INR: INR: 2.2

## 2013-06-12 ENCOUNTER — Other Ambulatory Visit: Payer: Self-pay | Admitting: Family Medicine

## 2013-06-12 NOTE — Telephone Encounter (Signed)
Diazepam refill phoned to pt's pharmacy. 

## 2013-06-25 ENCOUNTER — Telehealth: Payer: Self-pay

## 2013-06-25 NOTE — Telephone Encounter (Signed)
Dr.Mcpherson, Mr.Gie PT, from [facility] would like to inform you that pt will start PT tomorrow instead of today per pt's request. Best# [phone]

## 2013-06-29 ENCOUNTER — Ambulatory Visit (INDEPENDENT_AMBULATORY_CARE_PROVIDER_SITE_OTHER): Payer: Medicare PPO | Admitting: *Deleted

## 2013-06-29 DIAGNOSIS — Z7901 Long term (current) use of anticoagulants: Secondary | ICD-10-CM

## 2013-06-29 DIAGNOSIS — R600 Localized edema: Secondary | ICD-10-CM

## 2013-06-29 DIAGNOSIS — R609 Edema, unspecified: Secondary | ICD-10-CM

## 2013-06-29 DIAGNOSIS — I82409 Acute embolism and thrombosis of unspecified deep veins of unspecified lower extremity: Secondary | ICD-10-CM

## 2013-06-29 DIAGNOSIS — Z86711 Personal history of pulmonary embolism: Secondary | ICD-10-CM

## 2013-06-29 LAB — POCT INR: INR: 1.4

## 2013-07-06 ENCOUNTER — Ambulatory Visit (INDEPENDENT_AMBULATORY_CARE_PROVIDER_SITE_OTHER): Payer: Medicare PPO | Admitting: Pharmacist

## 2013-07-06 DIAGNOSIS — I824Y3 Acute embolism and thrombosis of unspecified deep veins of proximal lower extremity, bilateral: Secondary | ICD-10-CM

## 2013-07-06 DIAGNOSIS — R609 Edema, unspecified: Secondary | ICD-10-CM

## 2013-07-06 DIAGNOSIS — R600 Localized edema: Secondary | ICD-10-CM

## 2013-07-06 DIAGNOSIS — I82409 Acute embolism and thrombosis of unspecified deep veins of unspecified lower extremity: Secondary | ICD-10-CM

## 2013-07-06 DIAGNOSIS — I824Y9 Acute embolism and thrombosis of unspecified deep veins of unspecified proximal lower extremity: Secondary | ICD-10-CM

## 2013-07-06 DIAGNOSIS — Z86711 Personal history of pulmonary embolism: Secondary | ICD-10-CM

## 2013-07-06 DIAGNOSIS — Z7901 Long term (current) use of anticoagulants: Secondary | ICD-10-CM

## 2013-07-06 LAB — POCT INR: INR: 2

## 2013-07-28 ENCOUNTER — Emergency Department (HOSPITAL_COMMUNITY): Payer: Medicare PPO

## 2013-07-28 ENCOUNTER — Emergency Department (HOSPITAL_COMMUNITY)
Admission: EM | Admit: 2013-07-28 | Discharge: 2013-07-28 | Disposition: A | Discharge status: Home / Self Care | Payer: Medicare PPO | Attending: Emergency Medicine | Admitting: Emergency Medicine

## 2013-07-28 ENCOUNTER — Encounter (HOSPITAL_COMMUNITY): Payer: Self-pay | Admitting: Cardiology

## 2013-07-28 DIAGNOSIS — S6000XA Contusion of unspecified finger without damage to nail, initial encounter: Secondary | ICD-10-CM | POA: Insufficient documentation

## 2013-07-28 DIAGNOSIS — Z86711 Personal history of pulmonary embolism: Secondary | ICD-10-CM | POA: Insufficient documentation

## 2013-07-28 DIAGNOSIS — Z87891 Personal history of nicotine dependence: Secondary | ICD-10-CM | POA: Insufficient documentation

## 2013-07-28 DIAGNOSIS — W050XXA Fall from non-moving wheelchair, initial encounter: Secondary | ICD-10-CM | POA: Insufficient documentation

## 2013-07-28 DIAGNOSIS — S90211A Contusion of right great toe with damage to nail, initial encounter: Secondary | ICD-10-CM

## 2013-07-28 DIAGNOSIS — Y9383 Activity, rough housing and horseplay: Secondary | ICD-10-CM | POA: Insufficient documentation

## 2013-07-28 DIAGNOSIS — Z86718 Personal history of other venous thrombosis and embolism: Secondary | ICD-10-CM | POA: Insufficient documentation

## 2013-07-28 DIAGNOSIS — Z8739 Personal history of other diseases of the musculoskeletal system and connective tissue: Secondary | ICD-10-CM | POA: Insufficient documentation

## 2013-07-28 DIAGNOSIS — D689 Coagulation defect, unspecified: Secondary | ICD-10-CM | POA: Insufficient documentation

## 2013-07-28 DIAGNOSIS — Z79899 Other long term (current) drug therapy: Secondary | ICD-10-CM | POA: Insufficient documentation

## 2013-07-28 DIAGNOSIS — I10 Essential (primary) hypertension: Secondary | ICD-10-CM | POA: Insufficient documentation

## 2013-07-28 DIAGNOSIS — S0990XA Unspecified injury of head, initial encounter: Secondary | ICD-10-CM | POA: Insufficient documentation

## 2013-07-28 DIAGNOSIS — Y929 Unspecified place or not applicable: Secondary | ICD-10-CM | POA: Insufficient documentation

## 2013-07-28 DIAGNOSIS — Z7901 Long term (current) use of anticoagulants: Secondary | ICD-10-CM | POA: Insufficient documentation

## 2013-07-28 MED ORDER — ACETAMINOPHEN 500 MG PO TABS
1000.0000 mg | ORAL_TABLET | Freq: Once | ORAL | Status: AC
  Administered 2013-07-28: 1000 mg via ORAL
  Filled 2013-07-28: qty 2

## 2013-07-28 MED ORDER — HYDROCODONE-ACETAMINOPHEN 5-325 MG PO TABS: 1.0000 | ORAL_TABLET | Freq: Four times a day (QID) | ORAL | Status: DC | PRN

## 2013-07-28 NOTE — ED Provider Notes (Signed)
CSN: [id number]     Arrival date & time 07/28/13  1549 History   This chart was scribed for non-physician practitioner, Arthor Captain, PA-C, working with Toy Baker, MD by Clydene Laming, ED Scribe. This patient was seen in room TR11C/TR11C and the patient's care was started at 4:08 PM.    Chief Complaint  Patient presents with  . Toe Pain    The history is provided by the patient. No language interpreter was used.   HPI Comments: Nicolas [name] is a 47 y.o. male who presents to the Emergency Department complaining of constant, moderate right great toe pain onset last night after falling out of his wheelchair. Pt states that he was wrestling with his child yesterday when he didn't realize the arm rest was up, and he fell out of his wheelchair. He reports that he hit his head, but did not lose consciousness. He states that his only injury related to this fall is his right great toe. He states that he has taken nothing for pain. Pt has some tingling and numbness to his lower extremities at baseline, but denies any recent changes. Pt denies history of gout. He denies fevers, chills, headache, visual disturbances or any other symptoms. Pt walks with assistance only, and otherwise uses a wheelchair.    Past Medical History  Diagnosis Date  . Arthritis   . Clotting disorder   . Hypertension   . Pulmonary embolism     x 3 in 2005, 2008, 2010  . DVT (deep venous thrombosis)     Summer 2012   Past Surgical History  Procedure Laterality Date  . Spine surgery  11/30/11    Total 6 back surgeries   Family History  Problem Relation Age of Onset  . Cancer Mother 77    Lung  . Hypertension Father   . Diabetes Father    History  Substance Use Topics  . Smoking status: Former Smoker -- 1.50 packs/day for 10 years    Types: Cigarettes    Quit date: 04/23/2001  . Smokeless tobacco: Not on file  . Alcohol Use: 0.5 oz/week    1 drink(s) per week    Review of Systems A complete 10 system  review of systems was obtained and all systems are negative except as noted in the HPI and PMH.   Allergies  Lisinopril  Home Medications   Current Outpatient Rx  Name  Route  Sig  Dispense  Refill  . ammonium lactate (LAC-HYDRIN) 12 % cream   Topical   Apply topically as needed for dry skin.   385 g   3   . diazepam (VALIUM) 10 MG tablet      take 1 tablet by mouth every 8 hours if needed for anxiety   60 tablet   0   . glucose blood test strip   Other   1 each by Other route as needed. Use as instructed         . ketoconazole (NIZORAL) 2 % shampoo   Topical   Apply topically 2 (two) times a week.   120 mL   5   . metFORMIN (GLUCOPHAGE) 500 MG tablet   Oral   Take 1 tablet (500 mg total) by mouth 2 (two) times daily with a meal.   60 tablet   3   . metoprolol succinate (TOPROL-XL) 50 MG 24 hr tablet   Oral   Take 1 tablet (50 mg total) by mouth daily.   30 tablet  5   . potassium chloride SA (KLOR-CON M20) 20 MEQ tablet   Oral   Take 1 tablet (20 mEq total) by mouth 2 (two) times daily.   60 tablet   2   . sertraline (ZOLOFT) 50 MG tablet      Take 1 1/2 tablets every day.   46 tablet   5   . torsemide (DEMADEX) 20 MG tablet      Take 1 tablet by mouth twice a day.   60 tablet   2   . warfarin (COUMADIN) 10 MG tablet   Oral   Take 5-10 mg by mouth daily. 5mg  on Sat & Mon 10mg  on all other days (Tues, Wed, Thurs, Fri, Sun.)          Triage Vitals: BP 125/76  Pulse 89  Temp(Src) 98.2 F (36.8 C) (Oral)  Resp 16  SpO2 93%  Physical Exam  Nursing note and vitals reviewed. Constitutional: He is oriented to person, place, and time. He appears well-developed and well-nourished. No distress.  HENT:  Head: Normocephalic and atraumatic.  Eyes: EOM are normal.  Neck: Neck supple. No tracheal deviation present.  Cardiovascular: Normal rate.   Pulmonary/Chest: Effort normal. No respiratory distress.  Musculoskeletal: Normal range of motion.   Non-pitting edema of feet. Great toe on the right is markedly swollen and red.  Large subungual hematoma present  Neurological: He is alert and oriented to person, place, and time.  Skin: Skin is warm and dry.  Some tattooing on legs, consistent with venous stasis dermatitis. Discoloration of the right great toe. Abrasion to the left knee.  Psychiatric: He has a normal mood and affect. His behavior is normal.    ED Course  NERVE BLOCK Date/Time: 07/30/2013 6:35 PM Performed by: Arthor Captain Authorized by: Arthor Captain Consent: Verbal consent obtained. Risks and benefits: risks, benefits and alternatives were discussed Patient identity confirmed: verbally with patient Time out: Immediately prior to procedure a "time out" was called to verify the correct patient, procedure, equipment, support staff and site/side marked as required. Indications: pain relief Body area: lower extremity Nerve: digital Laterality: right Patient sedated: no Preparation: Patient was prepped and draped in the usual sterile fashion. Patient position: sitting Needle gauge: 27 G Location technique: anatomical landmarks Local anesthetic: lidocaine 2% without epinephrine Outcome: pain improved  Cauterization Date/Time: 07/30/2013 6:36 PM Performed by: Arthor Captain Authorized by: Arthor Captain Consent: Verbal consent obtained. Consent given by: patient Required items: required blood products, implants, devices, and special equipment available Patient identity confirmed: verbally with patient Local anesthesia used: yes Anesthesia: digital block Patient sedated: no Patient tolerance: Patient tolerated the procedure well with no immediate complications. Comments: Trepanation of the great toe of the left foot performed with low-temperature disposable cautery.  There was immediate release of pressurized blood.  The patient had better movement of the toe after procedure performed.   (including critical  care time)  DIAGNOSTIC STUDIES: Oxygen Saturation is 93% on room air, adequate by my interpretation.    COORDINATION OF CARE: 5:20 PM- Patients toe will be numbed and drained. Pt will also be given Tylenol for now along with an additional prescription. Pt verbalized understanding and agreement with plan.  Medications  acetaminophen (TYLENOL) tablet 1,000 mg (1,000 mg Oral Given 07/28/13 1734)    Labs Review Labs Reviewed - No data to display Imaging Review Dg Foot Complete Left  07/28/2013   *RADIOLOGY REPORT*  Clinical Data: Trauma with pain and swelling about the big toe.  LEFT FOOT - COMPLETE 3+ VIEW  Comparison: None.  Findings: The lateral view is oblique.  Suspect soft tissue swelling about the great toe.  No underlying fracture or dislocation.  Small Achilles and calcaneal spurs.  IMPRESSION: No acute osseous abnormality.   Original Report Authenticated By: Jeronimo Greaves, M.D.    MDM  No diagnosis found.   Patient with subungual hematomal .  Digital block and trepanation of the great toe performed. Patients toe cleaned thoroughly.  Sterile dressing applied  Supportive care discussed.  Patient has followup with his primary care physician in 2 days and have his wound checked at that time. The patient appears reasonably screened and/or stabilized for discharge and I doubt any other medical condition or other [facility] requiring further screening, evaluation, or treatment in the ED at this time prior to discharge.  I personally performed the services described in this documentation, which was scribed in my presence. The recorded information has been reviewed and is accurate.      Arthor Captain, PA-C 07/30/13 1839

## 2013-07-28 NOTE — ED Notes (Signed)
Pt reports that he fell out of his chair onto the gravel last night. States that he thinks he may have broken the big toe on his right foot. Denies any other pain. Pt with some abrasions to the left knee.

## 2013-07-28 NOTE — ED Notes (Signed)
Pt has returned from being out of the department 

## 2013-07-29 ENCOUNTER — Ambulatory Visit (INDEPENDENT_AMBULATORY_CARE_PROVIDER_SITE_OTHER): Payer: Medicare PPO | Admitting: Pharmacist

## 2013-07-29 DIAGNOSIS — I82409 Acute embolism and thrombosis of unspecified deep veins of unspecified lower extremity: Secondary | ICD-10-CM

## 2013-07-29 DIAGNOSIS — R609 Edema, unspecified: Secondary | ICD-10-CM

## 2013-07-29 DIAGNOSIS — Z7901 Long term (current) use of anticoagulants: Secondary | ICD-10-CM

## 2013-07-29 DIAGNOSIS — Z86711 Personal history of pulmonary embolism: Secondary | ICD-10-CM

## 2013-07-29 DIAGNOSIS — R600 Localized edema: Secondary | ICD-10-CM

## 2013-07-29 DIAGNOSIS — I824Y3 Acute embolism and thrombosis of unspecified deep veins of proximal lower extremity, bilateral: Secondary | ICD-10-CM

## 2013-07-29 DIAGNOSIS — I824Y9 Acute embolism and thrombosis of unspecified deep veins of unspecified proximal lower extremity: Secondary | ICD-10-CM

## 2013-07-29 LAB — POCT INR: INR: 2.6

## 2013-07-31 ENCOUNTER — Encounter: Payer: Self-pay | Admitting: Family Medicine

## 2013-07-31 ENCOUNTER — Ambulatory Visit (INDEPENDENT_AMBULATORY_CARE_PROVIDER_SITE_OTHER): Payer: Medicare PPO | Admitting: Family Medicine

## 2013-07-31 VITALS — BP 148/86 | HR 75 | Temp 99.0°F | Resp 16 | Ht 69.0 in | Wt 377.0 lb

## 2013-07-31 DIAGNOSIS — Z760 Encounter for issue of repeat prescription: Secondary | ICD-10-CM

## 2013-07-31 DIAGNOSIS — IMO0002 Reserved for concepts with insufficient information to code with codable children: Secondary | ICD-10-CM

## 2013-07-31 DIAGNOSIS — S90121S Contusion of right lesser toe(s) without damage to nail, sequela: Secondary | ICD-10-CM

## 2013-07-31 DIAGNOSIS — G4700 Insomnia, unspecified: Secondary | ICD-10-CM

## 2013-07-31 DIAGNOSIS — E119 Type 2 diabetes mellitus without complications: Secondary | ICD-10-CM

## 2013-07-31 LAB — POCT GLYCOSYLATED HEMOGLOBIN (HGB A1C): Hemoglobin A1C: 6.6

## 2013-07-31 MED ORDER — TRAZODONE HCL 100 MG PO TABS: ORAL_TABLET | ORAL | Status: DC

## 2013-07-31 MED ORDER — CEPHALEXIN 500 MG PO CAPS: ORAL_CAPSULE | ORAL | Status: DC

## 2013-07-31 MED ORDER — SERTRALINE HCL 50 MG PO TABS: ORAL_TABLET | ORAL | Status: DC

## 2013-07-31 MED ORDER — METOPROLOL SUCCINATE ER 50 MG PO TB24: 50.0000 mg | ORAL_TABLET | Freq: Every day | ORAL | Status: DC

## 2013-07-31 MED ORDER — HYDROCODONE-ACETAMINOPHEN 5-325 MG PO TABS: 1.0000 | ORAL_TABLET | Freq: Four times a day (QID) | ORAL | Status: DC | PRN

## 2013-07-31 NOTE — Progress Notes (Signed)
S: This 47 y.o. male is here today for follow-up of toe contusion, a result of accident that occurred while he was wrestling w/ his son. He was treated an Johnson and hematoma under R great toenail was drained. He still has significant pain and swelling. He requests more pain medication for foot pain and chronic low back pain.  Pt has Type II DM controlled w/ A1c= 6.1% in June. He has been compliant with medications and checking FSBS daily; he denies hypoglycemia and no values >200. Nutrition has been consistent but activity very limted due to chronic LBP/ lumbar DDD with leg weakness and chronic venous stasis/edema. Pt reports leg edema does subside overnight; he sleeps in a king-size bed. During the day, he sits in motorized scooter and does not have a recliner for leg elevation.  Pt reports R ear pain for 1-2 weeks. Jaw seems to be swollen. No pain w/ chewing or swallowing. Ear has been draining and hearing is decreased. Pt denies fever. He does recall going into pool prior to onset of problem.  Pt reports chronic insomnia; he is often awake until 2-3 AM. He is watching TV or reading. He requests "something to help with sleep".  Patient Active Problem List   Diagnosis Date Noted  . Venous stasis dermatitis 09/26/2012  . Encounter for long-term (current) use of anticoagulants 03/26/2012  . Acute venous embolism and thrombosis of deep vessels of proximal lower extremity 03/26/2012  . Spinal stenosis of lumbar region 03/06/2012  . HTN (hypertension) 03/06/2012  . Hx pulmonary embolism 03/06/2012  . Morbid obesity 03/06/2012  . Lower extremity edema 03/06/2012   PMHx, Soc Hx and Fam Hx reviewed.  ROS: As per HPI.  O: Filed Vitals:   07/31/13 1440  BP: 148/86  Pulse: 75  Temp: 99 F (37.2 C)  Resp: 16   GEN: In NAD; WN,WD. Obese sitting in scooter chair. HEENT: [place]/AT; EOMI w/ clear conj/sclerae. R EAC edematous and purulent discharge evident; L EAC/TM normal. Post ph clear w/o exudate  or lesions. Periauricular area on R tender and swollen. NECK: No LAN or tenderness. COR: RRR. LUNGS: Normal resp and effort. MS: R foot- great toe swollen and erythematous; tender medial and plantar aspects. Nail discolored w/ evidence of drained hematoma. SKIN: Lower ext- brawny edema and discoloration. NEURO: A&O x 3; CNs intact. Nonfocal.  Results for orders placed in visit on 07/31/13  POCT GLYCOSYLATED HEMOGLOBIN (HGB A1C)      Result Value Range   Hemoglobin A1C 6.6      A/P: Toe contusion, right, sequela- RX: Cephalexin 500 mg tid; continue cleaning and allowing to air dry.  Type II or unspecified type diabetes mellitus without mention of complication, not stated as uncontrolled -  Stable; no medication change but pt cautioned about nutrition. Increased activity when able to ambulate once to healed.    Plan: POCT glycosylated hemoglobin (Hb A1C)  Insomnia- RX: Trazodone 100 mg 1 tab hs. Advise of other strategies to improve sleep hygiene.   Meds ordered this encounter  Medications  . sertraline (ZOLOFT) 50 MG tablet    Sig: Take 1 1/2 tablets every day.    Dispense:  46 tablet    Refill:  5  . metoprolol succinate (TOPROL-XL) 50 MG 24 hr tablet    Sig: Take 1 tablet (50 mg total) by mouth daily.    Dispense:  30 tablet    Refill:  5  . HYDROcodone-acetaminophen (NORCO) 5-325 MG per tablet  Sig: Take 1-2 tablets by mouth every 6 (six) hours as needed for pain.    Dispense:  20 tablet    Refill:  0    Do not fill before Monday, Sept 8, 2014.            . traZODone (DESYREL) 100 MG tablet    Sig: Take 1/2 tablet at bedtime for 3 nights then take 1 tablet at bedtime for sleep.    Dispense:  30 tablet    Refill:  3  . cephALEXin (KEFLEX) 500 MG capsule    Sig: Take 1 capsule 3 times a day for infection.    Dispense:  30 capsule    Refill:  0  . antipyrine-benzocaine (AURALGAN) otic solution    Sig: Place 3 drops into the right ear every 2 (two) hours as needed for  pain.    Dispense:  10 mL    Refill:  0

## 2013-07-31 NOTE — ED Provider Notes (Signed)
Medical screening examination/treatment/procedure(s) were performed by non-physician practitioner and as supervising physician I was immediately available for consultation/collaboration.  Arielle Eber T Niralya Ohanian, MD 07/31/13 0801 

## 2013-08-03 ENCOUNTER — Encounter: Payer: Self-pay | Admitting: Family Medicine

## 2013-08-03 DIAGNOSIS — E119 Type 2 diabetes mellitus without complications: Secondary | ICD-10-CM | POA: Insufficient documentation

## 2013-08-03 MED ORDER — ANTIPYRINE-BENZOCAINE 5.4-1.4 % OT SOLN: 3.0000 [drp] | OTIC | Status: DC | PRN

## 2013-08-14 ENCOUNTER — Encounter: Payer: Self-pay | Admitting: Family Medicine

## 2013-08-14 ENCOUNTER — Ambulatory Visit (INDEPENDENT_AMBULATORY_CARE_PROVIDER_SITE_OTHER): Payer: Medicare PPO | Admitting: Family Medicine

## 2013-08-14 VITALS — BP 150/68 | HR 105 | Temp 99.2°F | Resp 18 | Ht 69.0 in | Wt 371.0 lb

## 2013-08-14 DIAGNOSIS — M51379 Other intervertebral disc degeneration, lumbosacral region without mention of lumbar back pain or lower extremity pain: Secondary | ICD-10-CM

## 2013-08-14 DIAGNOSIS — M5136 Other intervertebral disc degeneration, lumbar region: Secondary | ICD-10-CM

## 2013-08-14 DIAGNOSIS — R29898 Other symptoms and signs involving the musculoskeletal system: Secondary | ICD-10-CM

## 2013-08-14 DIAGNOSIS — M5137 Other intervertebral disc degeneration, lumbosacral region: Secondary | ICD-10-CM

## 2013-08-14 DIAGNOSIS — IMO0002 Reserved for concepts with insufficient information to code with codable children: Secondary | ICD-10-CM

## 2013-08-14 NOTE — Patient Instructions (Addendum)
I have ordered an MRI of lumbar spine to be done ASAP. Once I have the results, I will have you scheduled to return.

## 2013-08-18 NOTE — Progress Notes (Signed)
S: This 47 y.o. male is here for follow-up re: toe contusion. R great toe is less swollen and red; nail remains discolored but it is not tender and there is no drainage.  Pt has chronic LBP due to lumbar DDD. Since his fall out of his motorized scooter (which caused the toe injury), his back pain and R leg weakness have increased. He also has saddle anesthesia and some bowel incontinence. Gait difficulty is increasing; pt ambulates w/ a walker.  Patient Active Problem List   Diagnosis Date Noted  . Type II or unspecified type diabetes mellitus without mention of complication, not stated as uncontrolled 08/03/2013  . Venous stasis dermatitis 09/26/2012  . Encounter for long-term (current) use of anticoagulants 03/26/2012  . Acute venous embolism and thrombosis of deep vessels of proximal lower extremity 03/26/2012  . Spinal stenosis of lumbar region 03/06/2012  . HTN (hypertension) 03/06/2012  . Hx pulmonary embolism 03/06/2012  . Morbid obesity 03/06/2012  . Lower extremity edema 03/06/2012   PMHx, Soc Hx and Fam Hx reviewed.  ROS: As per HPI.   O: Filed Vitals:   08/14/13 1149  BP: 150/68  Pulse: 105  Temp: 99.2 F (37.3 C)  Resp: 18   GEN: In NAD; WN,WD. Seated in scooter. Pt is obese. COR: RRR. LUNGS: Normal resp rate and effort. SKIN: Lower ext- dry and scaly w/ chronic venous stasis changes. R great toenail discoloration but not tender and no active drainage. NEURO: A&Ox 3; CNs intact. Gait difficult w/ walker due to leg weakness. Motor function not directly tested.  A/P: DDD (degenerative disc disease), lumbar - Plan: MR Lumbar Spine Wo Contrast  Weakness of right leg - Plan: MR Lumbar Spine Wo Contrast  Late effect of contusion  Continue current medications.

## 2013-08-20 ENCOUNTER — Other Ambulatory Visit: Payer: Self-pay | Admitting: Family Medicine

## 2013-08-21 MED ORDER — DIAZEPAM 10 MG PO TABS: ORAL_TABLET | ORAL | Status: DC

## 2013-08-21 MED ORDER — ANTIPYRINE-BENZOCAINE 5.4-1.4 % OT SOLN: 3.0000 [drp] | OTIC | Status: DC | PRN

## 2013-08-21 MED ORDER — HYDROCODONE-ACETAMINOPHEN 5-325 MG PO TABS: 1.0000 | ORAL_TABLET | Freq: Four times a day (QID) | ORAL | Status: DC | PRN

## 2013-08-21 NOTE — Telephone Encounter (Signed)
Generic Auralgan Otic Solution, Diazepam #60 and HC-APAP 5-325 #30 phoned to pt's pharmacy.

## 2013-08-28 ENCOUNTER — Other Ambulatory Visit: Payer: Self-pay

## 2013-08-28 ENCOUNTER — Ambulatory Visit
Admission: RE | Admit: 2013-08-28 | Discharge: 2013-08-28 | Disposition: A | Discharge status: Home / Self Care | Payer: Commercial Managed Care - HMO | Source: Ambulatory Visit | Attending: Family Medicine | Admitting: Family Medicine

## 2013-08-28 ENCOUNTER — Ambulatory Visit (INDEPENDENT_AMBULATORY_CARE_PROVIDER_SITE_OTHER): Payer: Medicare PPO | Admitting: *Deleted

## 2013-08-28 DIAGNOSIS — Z86711 Personal history of pulmonary embolism: Secondary | ICD-10-CM

## 2013-08-28 DIAGNOSIS — I82409 Acute embolism and thrombosis of unspecified deep veins of unspecified lower extremity: Secondary | ICD-10-CM

## 2013-08-28 DIAGNOSIS — Z7901 Long term (current) use of anticoagulants: Secondary | ICD-10-CM

## 2013-08-28 DIAGNOSIS — M5136 Other intervertebral disc degeneration, lumbar region: Secondary | ICD-10-CM

## 2013-08-28 DIAGNOSIS — R29898 Other symptoms and signs involving the musculoskeletal system: Secondary | ICD-10-CM

## 2013-08-28 DIAGNOSIS — R600 Localized edema: Secondary | ICD-10-CM

## 2013-08-28 DIAGNOSIS — R609 Edema, unspecified: Secondary | ICD-10-CM

## 2013-08-28 LAB — POCT INR: INR: 2.9

## 2013-08-31 ENCOUNTER — Encounter: Payer: Self-pay | Admitting: Family Medicine

## 2013-09-15 ENCOUNTER — Encounter: Payer: Self-pay | Admitting: Cardiovascular Disease

## 2013-09-15 ENCOUNTER — Ambulatory Visit (INDEPENDENT_AMBULATORY_CARE_PROVIDER_SITE_OTHER): Payer: Medicare PPO | Admitting: Cardiovascular Disease

## 2013-09-15 VITALS — BP 132/88 | HR 78 | Ht 69.0 in | Wt 372.0 lb

## 2013-09-15 DIAGNOSIS — R600 Localized edema: Secondary | ICD-10-CM

## 2013-09-15 DIAGNOSIS — Z7901 Long term (current) use of anticoagulants: Secondary | ICD-10-CM

## 2013-09-15 DIAGNOSIS — Z86711 Personal history of pulmonary embolism: Secondary | ICD-10-CM

## 2013-09-15 DIAGNOSIS — R609 Edema, unspecified: Secondary | ICD-10-CM

## 2013-09-15 NOTE — Progress Notes (Signed)
History of Present Illness: 47 yo male with history of HTN, PE on chronic coumadin therapy, spinal stenosis here today for follow up. I saw him as a new patient 03/24/12 for evaluation of lower extremity edema. He was discharged from a rehab facility on 02/28/12 after spine surgery which was performed at [facility] in [place], [place] in January 2013. The pt reports total of 6 back surgeries for spinal stenosis ( spine disorder first diagnosed in 2004). He has a history of DVT in the summer 2012. He has history of PE in 2005, 2008, 2010. He has been on coumadin since summer 2012. He had an IVC filter placed in December 2012 when his blood thinner was stopped.  He had newly diagnosed HTN after the surgery along with fluid retention and lower extremity edema. He had been on Demadex 20 mg po BID since February 2013.  I suspected that he has chronic venous stasis disease. I ordered repeat venous dopplers and an echo. His echo showed normal LV size and function with LVEF of 55-60% with mild LVH and mild dilation of the left atrium. His venous dopplers May 2013  showed no evidence of DVT.   He has no new complaints today. Still has swelling in the left leg. No chest pain or SOB. His weight has been stable.   Primary Care Physician: Dow Adolph  Past Medical History  Diagnosis Date  . Arthritis   . Clotting disorder   . Hypertension   . Pulmonary embolism     x 3 in 2005, 2008, 2010  . DVT (deep venous thrombosis)     Summer 2012    Past Surgical History  Procedure Laterality Date  . Spine surgery  11/30/11    Total 6 back surgeries    Current Outpatient Prescriptions  Medication Sig Dispense Refill  . ammonium lactate (LAC-HYDRIN) 12 % cream Apply topically as needed for dry skin.  385 g  3  . diazepam (VALIUM) 10 MG tablet Take 1 tablet by mouth every 8 hours if needed for anxiety  60 tablet  0  . glucose blood test strip 1 each by Other route as needed. Use as instructed        . HYDROcodone-acetaminophen (NORCO) 5-325 MG per tablet Take 1-2 tablets by mouth every 6 (six) hours as needed for pain.  30 tablet  0  . ketoconazole (NIZORAL) 2 % shampoo Apply topically 2 (two) times a week.  120 mL  5  . metFORMIN (GLUCOPHAGE) 500 MG tablet Take 1 tablet (500 mg total) by mouth 2 (two) times daily with a meal.  60 tablet  3  . metoprolol succinate (TOPROL-XL) 50 MG 24 hr tablet Take 1 tablet (50 mg total) by mouth daily.  30 tablet  5  . potassium chloride SA (KLOR-CON M20) 20 MEQ tablet Take 1 tablet (20 mEq total) by mouth 2 (two) times daily.  60 tablet  2  . sertraline (ZOLOFT) 50 MG tablet Take 1 1/2 tablets every day.  46 tablet  5  . torsemide (DEMADEX) 20 MG tablet Take 1 tablet by mouth twice a day.  60 tablet  2  . traZODone (DESYREL) 100 MG tablet Take 100 mg by mouth at bedtime.      Marland Kitchen warfarin (COUMADIN) 10 MG tablet Take 5mg  on Sat & Mon and  10mg  on all other days (Tues, Wed, Thurs, Fri, Sun.)       No current facility-administered medications for this visit.  Allergies  Allergen Reactions  . Lisinopril     cough    History   Social History  . Marital Status: Legally Separated    Spouse Name: N/A    Number of Children: 3  . Years of Education: N/A   Occupational History  . Disability    Social History Main Topics  . Smoking status: Former Smoker -- 1.50 packs/day for 10 years    Types: Cigarettes    Quit date: 04/23/2001  . Smokeless tobacco: Not on file  . Alcohol Use: 0.5 oz/week    1 drink(s) per week  . Drug Use: No  . Sexual Activity: Not on file   Other Topics Concern  . Not on file   Social History Narrative  . No narrative on file    Family History  Problem Relation Age of Onset  . Cancer Mother 34    Lung  . Hypertension Father   . Diabetes Father     Review of Systems:  As stated in the HPI and otherwise negative.   BP 132/88  Pulse 78  Ht 5\' 9"  (1.753 m)  Wt 357 lb 8 oz (162.161 kg)  BMI 52.77  kg/m2  Physical Examination: General: Well developed, well nourished, NAD HEENT: OP clear, mucus membranes moist SKIN: warm, dry. No rashes. Neuro: No focal deficits Musculoskeletal: Muscle strength 5/5 all ext Psychiatric: Mood and affect normal Neck: No JVD, no carotid bruits, no thyromegaly, no lymphadenopathy. Lungs:Clear bilaterally, no wheezes, rhonci, crackles Cardiovascular: Regular rate and rhythm. No murmurs, gallops or rubs. Abdomen:Soft. Bowel sounds present. Non-tender.  Extremities: 2+ right  lower extremity edema. Trace to 1+ edema left lower ext. Pulses are hard to palpate secondary to pt size. Venous stasis changes both lower ext.   Echo 04/07/12:  Left ventricle: The cavity size was normal. Wall thickness was increased in a pattern of mild LVH. Systolic function was normal. The estimated ejection fraction was in the range of 55% to 60%. Wall motion was normal; there were no regional wall motion abnormalities. Left ventricular diastolic function parameters were normal. - Left atrium: The atrium was mildly dilated.  EKG: NSR, rate 78 bpm.   Assessment and Plan:   1. History of pulmonary embolism: He will need lifelong anti-coagulation. No evidence of DVT on lower extremity venous dopplers  May 2013. He has been therapeutic on coumadin.    2. Lower extremity edema: Likely secondary to venous stasis disease and chronic DVT. Will continue coumadin for lifetime given his history of DVT, PE and immobility. He has an IVC filter in place. Continue diuretic at current dose. He has not been taking every day. I have encouraged him to take twice daily, every day. Elevate legs when sitting. I have asked him to call the [facility] for evaluation. I have nothing to offer and I suspect his venous damage from his recurrent DVT is irreversible.

## 2013-09-15 NOTE — Patient Instructions (Signed)
Your physician wants you to follow-up in One Year. You will receive a reminder letter in the mail two months in advance. If you don't receive a letter, please call our office to schedule the follow-up appointment.

## 2013-09-28 ENCOUNTER — Telehealth: Payer: Self-pay

## 2013-09-28 DIAGNOSIS — M79604 Pain in right leg: Secondary | ICD-10-CM

## 2013-09-28 NOTE — Telephone Encounter (Signed)
Pt called and would like [place] to refer him to a doctor for the poor circulation in his legs. Please call [phone]

## 2013-09-28 NOTE — Telephone Encounter (Signed)
Called patient to advise referral made.

## 2013-09-28 NOTE — Telephone Encounter (Signed)
Referral placed to [facility].  

## 2013-09-28 NOTE — Telephone Encounter (Signed)
Pt states he goes to [employer]. He states he has, of late, been feeling a lot of pressure in his legs. They are also swelling. His dr at [facility] says that given his history of blood clots in his legs, he needs to see a vein dr. He called his insurance company and they told him who he could see and told him he would not need a referral. But he called this vein dr and they told him that he would need a referral. Can we refer him for this? Referral would go to below, provided by insurance company:   Elise Benne, MD [address]. [phone]

## 2013-10-01 ENCOUNTER — Other Ambulatory Visit: Payer: Self-pay

## 2013-10-09 ENCOUNTER — Ambulatory Visit (INDEPENDENT_AMBULATORY_CARE_PROVIDER_SITE_OTHER): Payer: Medicare PPO | Admitting: *Deleted

## 2013-10-09 DIAGNOSIS — I82409 Acute embolism and thrombosis of unspecified deep veins of unspecified lower extremity: Secondary | ICD-10-CM

## 2013-10-09 DIAGNOSIS — R609 Edema, unspecified: Secondary | ICD-10-CM

## 2013-10-09 DIAGNOSIS — Z86711 Personal history of pulmonary embolism: Secondary | ICD-10-CM

## 2013-10-09 DIAGNOSIS — R600 Localized edema: Secondary | ICD-10-CM

## 2013-10-09 DIAGNOSIS — Z7901 Long term (current) use of anticoagulants: Secondary | ICD-10-CM

## 2013-10-09 LAB — POCT INR: INR: 2.9

## 2013-10-09 MED ORDER — WARFARIN SODIUM 10 MG PO TABS: ORAL_TABLET | ORAL | Status: DC

## 2013-10-26 ENCOUNTER — Encounter (HOSPITAL_COMMUNITY): Payer: Self-pay | Admitting: Emergency Medicine

## 2013-10-26 ENCOUNTER — Emergency Department (HOSPITAL_COMMUNITY)
Admission: EM | Admit: 2013-10-26 | Discharge: 2013-10-26 | Disposition: A | Discharge status: Home / Self Care | Payer: Medicare PPO | Attending: Emergency Medicine | Admitting: Emergency Medicine

## 2013-10-26 DIAGNOSIS — Z79899 Other long term (current) drug therapy: Secondary | ICD-10-CM | POA: Insufficient documentation

## 2013-10-26 DIAGNOSIS — Z87891 Personal history of nicotine dependence: Secondary | ICD-10-CM | POA: Insufficient documentation

## 2013-10-26 DIAGNOSIS — Z7901 Long term (current) use of anticoagulants: Secondary | ICD-10-CM | POA: Insufficient documentation

## 2013-10-26 DIAGNOSIS — G8929 Other chronic pain: Secondary | ICD-10-CM | POA: Insufficient documentation

## 2013-10-26 DIAGNOSIS — I10 Essential (primary) hypertension: Secondary | ICD-10-CM | POA: Insufficient documentation

## 2013-10-26 DIAGNOSIS — Z86711 Personal history of pulmonary embolism: Secondary | ICD-10-CM | POA: Insufficient documentation

## 2013-10-26 DIAGNOSIS — M129 Arthropathy, unspecified: Secondary | ICD-10-CM | POA: Insufficient documentation

## 2013-10-26 DIAGNOSIS — Z862 Personal history of diseases of the blood and blood-forming organs and certain disorders involving the immune mechanism: Secondary | ICD-10-CM | POA: Insufficient documentation

## 2013-10-26 DIAGNOSIS — Z86718 Personal history of other venous thrombosis and embolism: Secondary | ICD-10-CM | POA: Insufficient documentation

## 2013-10-26 DIAGNOSIS — M549 Dorsalgia, unspecified: Secondary | ICD-10-CM | POA: Insufficient documentation

## 2013-10-26 DIAGNOSIS — R209 Unspecified disturbances of skin sensation: Secondary | ICD-10-CM | POA: Insufficient documentation

## 2013-10-26 DIAGNOSIS — R200 Anesthesia of skin: Secondary | ICD-10-CM

## 2013-10-26 HISTORY — DX: Spinal stenosis, site unspecified: M48.00

## 2013-10-26 MED ORDER — OXYCODONE-ACETAMINOPHEN 5-325 MG PO TABS: 2.0000 | ORAL_TABLET | ORAL | Status: DC | PRN

## 2013-10-26 NOTE — ED Provider Notes (Addendum)
CSN: [id number]     Arrival date & time 10/26/13  1546 History   First MD Initiated Contact with Patient 10/26/13 2034     Chief Complaint  Patient presents with  . Numbness    HPI  Patient presents with complaints of 6 weeks of bilateral lower extremity numbness.  He has a history of bilateral extremity pain and chronic back pain due to a history of spinal stenosis and has had 2 previous lumbar decompressive laminectomies. On September 1 of this year he fell from his wheelchair. Laying with his son he twisted to his left he did not realize his left arm it was up and he fell to the floor. He was evaluated in the emergency room and discharged. He had a followup MRI on September 19 which showed postsurgical changes but no changes versus most recent comparison in no acute findings. He states that since his fall he has had numbness in his lower extremities his right greater than left. He has no bowel or bladder incontinence or retention he has persistence of pain in his back and down both legs. He was expresses frustration that he's had trouble getting in to see some of his physicians. He has had a good relationship with his primary care physician Dr. Audria Nine.  He is diabetic. Last A1c is  6.7. He does have some chronic numbness in his feet consistent with neuropathy.  Past Medical History  Diagnosis Date  . Arthritis   . Clotting disorder   . Hypertension   . Pulmonary embolism     x 3 in 2005, 2008, 2010  . DVT (deep venous thrombosis)     Summer 2012  . Spinal stenosis    Past Surgical History  Procedure Laterality Date  . Spine surgery  11/30/11    Total 6 back surgeries   Family History  Problem Relation Age of Onset  . Cancer Mother 48    Lung  . Hypertension Father   . Diabetes Father    History  Substance Use Topics  . Smoking status: Former Smoker -- 1.50 packs/day for 10 years    Types: Cigarettes    Quit date: 04/23/2001  . Smokeless tobacco: Not on file  . Alcohol  Use: 0.5 oz/week    1 drink(s) per week    Review of Systems  Constitutional: Negative for fever, chills, diaphoresis, appetite change and fatigue.  HENT: Negative for mouth sores, sore throat and trouble swallowing.   Eyes: Negative for visual disturbance.  Respiratory: Negative for cough, chest tightness, shortness of breath and wheezing.   Cardiovascular: Negative for chest pain.  Gastrointestinal: Negative for nausea, vomiting, abdominal pain, diarrhea and abdominal distention.  Endocrine: Negative for polydipsia, polyphagia and polyuria.  Genitourinary: Negative for dysuria, frequency and hematuria.  Musculoskeletal: Positive for back pain. Negative for gait problem.  Skin: Negative for color change, pallor and rash.  Neurological: Positive for numbness. Negative for dizziness, syncope, light-headedness and headaches.  Hematological: Does not bruise/bleed easily.  Psychiatric/Behavioral: Negative for behavioral problems and confusion.    Allergies  Lisinopril  Home Medications   Current Outpatient Rx  Name  Route  Sig  Dispense  Refill  . ammonium lactate (AMLACTIN) 12 % cream   Topical   Apply 1 g topically 2 (two) times daily.         . diazepam (VALIUM) 10 MG tablet      Take 1 tablet by mouth every 8 hours if needed for anxiety   60 tablet  0   . ketoconazole (NIZORAL) 2 % shampoo   Topical   Apply 1 application topically daily.         . metFORMIN (GLUCOPHAGE) 500 MG tablet   Oral   Take 500 mg by mouth daily with breakfast.         . metoprolol succinate (TOPROL-XL) 50 MG 24 hr tablet   Oral   Take 1 tablet (50 mg total) by mouth daily.   30 tablet   5   . potassium chloride SA (KLOR-CON M20) 20 MEQ tablet   Oral   Take 1 tablet (20 mEq total) by mouth 2 (two) times daily.   60 tablet   2   . sertraline (ZOLOFT) 50 MG tablet   Oral   Take 75 mg by mouth daily.         Marland Kitchen torsemide (DEMADEX) 20 MG tablet   Oral   Take 20 mg by mouth 2  (two) times daily.         . traZODone (DESYREL) 100 MG tablet   Oral   Take 100 mg by mouth at bedtime.         Marland Kitchen warfarin (COUMADIN) 10 MG tablet   Oral   Take 5-10 mg by mouth daily. Takes 10mg  on Tues, Wed, Thurs, Fri, Sun  Takes 5mg  Sat and Mon         . oxyCODONE-acetaminophen (PERCOCET/ROXICET) 5-325 MG per tablet   Oral   Take 2 tablets by mouth every 4 (four) hours as needed.   20 tablet   0    BP 128/75  Pulse 85  Temp(Src) 98.3 F (36.8 C) (Oral)  Resp 16  Wt 375 lb (170.099 kg)  SpO2 100% Physical Exam  Constitutional: He is oriented to person, place, and time.  The morbidly obese male sitting in his wheelchair. He is able to stand. He was in no acute distress  HENT:  Head: Normocephalic.  Eyes: Conjunctivae are normal. Pupils are equal, round, and reactive to light. No scleral icterus.  Neck: Normal range of motion. Neck supple. No thyromegaly present.  Cardiovascular: Normal rate and regular rhythm.  Exam reveals no gallop and no friction rub.   No murmur heard. Pulmonary/Chest: Effort normal and breath sounds normal. No respiratory distress. He has no wheezes. He has no rales.  Abdominal: Soft. Bowel sounds are normal. He exhibits no distension. There is no tenderness. There is no rebound.  Musculoskeletal: Normal range of motion.       Arms: Neurological: He is alert and oriented to person, place, and time.  He has a normal neurological exam and his neurological symptoms to his upper extremities. He has a negative Spurling signs. He does not describe Lhermitte's.  He has 1-2+ Achilles and knee jerk reflexes. Can heel stand toe stand he can fully extend and flex his knees. He has normal lower extremity sensation. He has decreased sensation in a stocking distribution from his distal third anterior shins bilaterally and symmetric  Skin: Skin is warm and dry. No rash noted.  Psychiatric: He has a normal mood and affect. His behavior is normal.    ED  Course  Procedures (including critical care time) Labs Review Labs Reviewed - No data to display Imaging Review No results found.  EKG Interpretation   None       MDM   1. Back pain   2. Numbness     Discussion: He is 372 pounds. Limit on MRI here is 350  pounds and I cannot perform imaging on him tonight. He said is been going on without any sign of rapid progression or change more than 6 weeks and he has had an MRI of his lumbar spine in the interval. I think he does need repeat imaging of his thoracic and lumbar spine. I do not think this needs to be done emergently tonight. This cannot be done emergently here tonight. He is comfortable with the plan to be discharged home with symptom control and a prescription for an outpatient MRI to be called to his primary care physician. If he develops incontinence or any changes suggestive of acute neurological compromise inability to stand the inability to control bowel or bladder incontinence or retention or fever and that should recheck here.  [facility] was able to perform his last MRI. I've given him an outpatient MRI prescription and the phone number to call to schedule.     Roney Marion, MD 10/26/13 2210  Roney Marion, MD 10/27/13 [phone]

## 2013-10-26 NOTE — ED Notes (Signed)
Pts name called to go to a room no answer 

## 2013-10-26 NOTE — ED Notes (Signed)
Pt reports having numbness below his waist starting July 27, 2013 after falling. Pt states he has been wheel chair bound for close to 2 years for spinal stenosis. Pt has been trying to f/u with a neuro surgeon since September but his insurance has not approved the f/u visit with a Retail buyer. Pt has had a MRI since falling but states they said there were no changes in his MRI. Pt states a receptionist instructed pt to come to the ED for further evaluation

## 2013-10-26 NOTE — ED Notes (Signed)
Pt reports increase in paralysis of RLE and decrease in bladder control for past two weeks. States that he has had multiple back surgeries for spinal stenosis which became worse after falling out of his wheel chair a couple of years ago. States that he has not taken pain medicine in 6 weeks and has been self medicating with marijuana. States that he has been trying to see a Retail buyer with no success due to referral/insurance difficulties. Pt has also been trying to get into a pain clinic with no success. Pt states he urinated on himself out in the lobby while waiting for a room.

## 2013-10-27 ENCOUNTER — Other Ambulatory Visit: Payer: Self-pay | Admitting: Family Medicine

## 2013-10-27 ENCOUNTER — Telehealth: Payer: Self-pay | Admitting: Radiology

## 2013-10-27 DIAGNOSIS — M5135 Other intervertebral disc degeneration, thoracolumbar region: Secondary | ICD-10-CM

## 2013-10-27 DIAGNOSIS — R200 Anesthesia of skin: Secondary | ICD-10-CM

## 2013-10-27 DIAGNOSIS — M546 Pain in thoracic spine: Secondary | ICD-10-CM

## 2013-10-27 NOTE — Telephone Encounter (Signed)
Patient called to ask if there was a way Dr. Audria Nine could make him a referral without an office visit, he was seen in the ED recently and was given orders to go get his mri immediately (he was unable to do it at the hospital due to him being over the wieght limit and being wheel chair bound.) ED Dr. Referred him to [facility].  Patient  was told by [facility] that it would be easier for them for the patient to get a referral through our office in order to make an appt for an MRI- throracic. Amy : is aware and will let Dr. Audria Nine know as soon as possible. Please call patient if this can be done soon.   Please advise.  [phone]

## 2013-10-27 NOTE — Telephone Encounter (Signed)
Phone call from patient, he has had increased back pain, MRI scan ordered in ER, but imaging center is asking for you to order this, please advise.

## 2013-11-02 ENCOUNTER — Telehealth: Payer: Self-pay

## 2013-11-02 MED ORDER — OXYCODONE-ACETAMINOPHEN 5-325 MG PO TABS: ORAL_TABLET | ORAL | Status: DC

## 2013-11-02 NOTE — Telephone Encounter (Signed)
PT STATES HE HAVE TO HAVE AN MRI AND IS OUT OF THE PAIN MEDICINE HE WAS GIVEN AT [facility] , WOULD LIKE THE DR TO WRITE HIM A PRESCRIPTION FOR SOME MORE AND WANT HIS DAUGHTER TO PICK UP. PLEASE CALL PT AT [phone]

## 2013-11-02 NOTE — Telephone Encounter (Signed)
Patient advised. His girlfriend will pick up. She is on his HIPAA

## 2013-11-02 NOTE — Telephone Encounter (Signed)
I have reordered Percocet pain med; prescription can be picked up at [facility]. Please notify pt/ daughter.

## 2013-11-03 NOTE — Telephone Encounter (Signed)
Dr Audria Nine has advised this can be done/ was ordered.

## 2013-11-10 ENCOUNTER — Emergency Department (HOSPITAL_COMMUNITY): Payer: Medicare PPO

## 2013-11-10 ENCOUNTER — Encounter (HOSPITAL_COMMUNITY): Payer: Self-pay | Admitting: Emergency Medicine

## 2013-11-10 ENCOUNTER — Emergency Department (HOSPITAL_COMMUNITY)
Admission: EM | Admit: 2013-11-10 | Discharge: 2013-11-12 | Disposition: A | Discharge status: Home Health | Payer: Medicare PPO | Attending: Emergency Medicine | Admitting: Emergency Medicine

## 2013-11-10 DIAGNOSIS — S3992XA Unspecified injury of lower back, initial encounter: Secondary | ICD-10-CM

## 2013-11-10 DIAGNOSIS — Z79899 Other long term (current) drug therapy: Secondary | ICD-10-CM | POA: Insufficient documentation

## 2013-11-10 DIAGNOSIS — Z86718 Personal history of other venous thrombosis and embolism: Secondary | ICD-10-CM | POA: Insufficient documentation

## 2013-11-10 DIAGNOSIS — Z87891 Personal history of nicotine dependence: Secondary | ICD-10-CM | POA: Insufficient documentation

## 2013-11-10 DIAGNOSIS — Y92009 Unspecified place in unspecified non-institutional (private) residence as the place of occurrence of the external cause: Secondary | ICD-10-CM | POA: Insufficient documentation

## 2013-11-10 DIAGNOSIS — Z888 Allergy status to other drugs, medicaments and biological substances status: Secondary | ICD-10-CM | POA: Insufficient documentation

## 2013-11-10 DIAGNOSIS — M546 Pain in thoracic spine: Secondary | ICD-10-CM | POA: Insufficient documentation

## 2013-11-10 DIAGNOSIS — Z7901 Long term (current) use of anticoagulants: Secondary | ICD-10-CM | POA: Insufficient documentation

## 2013-11-10 DIAGNOSIS — W06XXXA Fall from bed, initial encounter: Secondary | ICD-10-CM | POA: Insufficient documentation

## 2013-11-10 DIAGNOSIS — I10 Essential (primary) hypertension: Secondary | ICD-10-CM | POA: Insufficient documentation

## 2013-11-10 DIAGNOSIS — W19XXXA Unspecified fall, initial encounter: Secondary | ICD-10-CM

## 2013-11-10 DIAGNOSIS — Y9389 Activity, other specified: Secondary | ICD-10-CM | POA: Insufficient documentation

## 2013-11-10 DIAGNOSIS — M129 Arthropathy, unspecified: Secondary | ICD-10-CM | POA: Insufficient documentation

## 2013-11-10 DIAGNOSIS — Z86711 Personal history of pulmonary embolism: Secondary | ICD-10-CM | POA: Insufficient documentation

## 2013-11-10 DIAGNOSIS — D689 Coagulation defect, unspecified: Secondary | ICD-10-CM | POA: Insufficient documentation

## 2013-11-10 DIAGNOSIS — M4800 Spinal stenosis, site unspecified: Secondary | ICD-10-CM | POA: Insufficient documentation

## 2013-11-10 LAB — GLUCOSE, CAPILLARY: Glucose-Capillary: 156 mg/dL — ABNORMAL HIGH (ref 70–99)

## 2013-11-10 MED ORDER — CYCLOBENZAPRINE HCL 10 MG PO TABS
10.0000 mg | ORAL_TABLET | Freq: Once | ORAL | Status: AC
  Administered 2013-11-10: 10 mg via ORAL
  Filled 2013-11-10: qty 1

## 2013-11-10 MED ORDER — OXYCODONE-ACETAMINOPHEN 5-325 MG PO TABS
2.0000 | ORAL_TABLET | Freq: Once | ORAL | Status: AC
  Administered 2013-11-10: 2 via ORAL
  Filled 2013-11-10: qty 2

## 2013-11-10 MED ORDER — TORSEMIDE 20 MG PO TABS
20.0000 mg | ORAL_TABLET | Freq: Two times a day (BID) | ORAL | Status: DC
  Administered 2013-11-11 – 2013-11-12 (×3): 20 mg via ORAL
  Filled 2013-11-10 (×5): qty 1

## 2013-11-10 MED ORDER — DIAZEPAM 5 MG PO TABS
10.0000 mg | ORAL_TABLET | Freq: Three times a day (TID) | ORAL | Status: DC | PRN
  Administered 2013-11-11 (×2): 10 mg via ORAL
  Filled 2013-11-10 (×2): qty 2

## 2013-11-10 MED ORDER — METOPROLOL SUCCINATE ER 50 MG PO TB24
50.0000 mg | ORAL_TABLET | Freq: Every day | ORAL | Status: DC
  Administered 2013-11-10 – 2013-11-12 (×3): 50 mg via ORAL
  Filled 2013-11-10 (×3): qty 1

## 2013-11-10 MED ORDER — WARFARIN SODIUM 10 MG PO TABS
10.0000 mg | ORAL_TABLET | Freq: Once | ORAL | Status: AC
  Administered 2013-11-10: 10 mg via ORAL
  Filled 2013-11-10: qty 1

## 2013-11-10 MED ORDER — SERTRALINE HCL 50 MG PO TABS
75.0000 mg | ORAL_TABLET | Freq: Every day | ORAL | Status: DC
  Administered 2013-11-10 – 2013-11-11 (×2): 75 mg via ORAL
  Filled 2013-11-10 (×2): qty 2

## 2013-11-10 MED ORDER — OXYCODONE-ACETAMINOPHEN 5-325 MG PO TABS: 2.0000 | ORAL_TABLET | ORAL | Status: DC | PRN

## 2013-11-10 MED ORDER — TRAZODONE HCL 50 MG PO TABS
100.0000 mg | ORAL_TABLET | Freq: Every day | ORAL | Status: DC
  Administered 2013-11-10: 50 mg via ORAL
  Administered 2013-11-11: 100 mg via ORAL
  Filled 2013-11-10 (×2): qty 2

## 2013-11-10 MED ORDER — WARFARIN - PHYSICIAN DOSING INPATIENT: Freq: Every day | Status: DC

## 2013-11-10 MED ORDER — POTASSIUM CHLORIDE CRYS ER 20 MEQ PO TBCR
20.0000 meq | EXTENDED_RELEASE_TABLET | Freq: Two times a day (BID) | ORAL | Status: DC
  Administered 2013-11-10 – 2013-11-12 (×4): 20 meq via ORAL
  Filled 2013-11-10 (×4): qty 1

## 2013-11-10 MED ORDER — METFORMIN HCL 500 MG PO TABS
500.0000 mg | ORAL_TABLET | Freq: Every day | ORAL | Status: DC
  Administered 2013-11-11 – 2013-11-12 (×2): 500 mg via ORAL
  Filled 2013-11-10 (×2): qty 1

## 2013-11-10 MED ORDER — WARFARIN SODIUM 5 MG PO TABS: 5.0000 mg | ORAL_TABLET | Freq: Once | ORAL | Status: DC

## 2013-11-10 NOTE — ED Notes (Signed)
[facility] contacted 

## 2013-11-10 NOTE — ED Notes (Signed)
Pt brought in by [facility], Pt was at home trying to use urinal and fell out of the bed

## 2013-11-10 NOTE — ED Notes (Signed)
Pt had fall in home, Pt stated that "he can't do anything with them"  lower extremites, pt able to wiggle toes, has good pulses BLE

## 2013-11-10 NOTE — Progress Notes (Signed)
Spoke with patients RN re plan for patient.This CM will contacted [facility] and Social worker Re Patient Dispo.

## 2013-11-10 NOTE — Progress Notes (Signed)
OT EVALUATION  47 yo s/p multiple back surgeries with recent fall at home, requiring EMS to lift from floor.   Clinical Impression: PTA, pt has required increased assistance with mobility and ADL by family. Pt reports that family has to lift him from bed and assist him to the toilet. During assessment, pt attempted to stand from stretcher before being instructed to do so by PT/OT. L knee "buckled", or appeared to move into flexor pattern, requiring OT/PT to gently lower pt to floor. Used Tenor lift to move pt from floor to bed. Nsg present and assessed pt. Pt is not safe to D/C home at this time due to the assistance he requires for mobility and ADL. Pt will need rehab at SNF to return to home with family. All further OT to be addressed at [facility].   11/10/13 1600  OT Visit Information  Last OT Received On 11/10/13  Assistance Needed +3 or more  Precautions  Precautions Fall  Home Living  Family/patient expects to be discharged to: Skilled nursing facility  Prior Function  Level of Independence Needs assistance  Gait / Transfers Assistance Needed lift equiopment  ADL's / Homemaking Assistance Needed Mod A with ADL (bed level)  Comments daughter assists pt OOB to w/c, where pt spends his entire day until his daughter returns home from work. Pt has had recent fall at home. Reports increased difficulty moving and controlling BLE.  Communication  Communication No difficulties  Cognition  Arousal/Alertness Awake/alert  Behavior During Therapy WFL for tasks assessed/performed  Overall Cognitive Status Within Functional Limits for tasks assessed  Upper Extremity Assessment  Upper Extremity Assessment Overall WFL for tasks assessed  Lower Extremity Assessment  Lower Extremity Assessment Defer to PT evaluation  Cervical / Trunk Assessment  Cervical / Trunk Assessment Other exceptions (multiple spinal surgeries)  ADL  Grooming Set up  Upper Body Bathing Set up  Where Assessed - Upper Body  Bathing Unsupported sitting  Lower Body Bathing Maximal assistance  Where Assessed - Lower Body Bathing Lean right and/or left  Upper Body Dressing Set up  Where Assessed - Upper Body Dressing Unsupported sitting  Lower Body Dressing Maximal assistance  Where Assessed - Lower Body Dressing Lean right and/or left  Toilet Transfer +2 Total assistance;Other (comment) (lift equipment)  Toileting - Clothing Manipulation and Hygiene Modified independent (with use of urinal)  Transfers/Ambulation Related to ADLs When trying to trnasfer, informed pt to scoot to edge of bed. Rather than scooting to EOB, pt attempted to stand. L LE flexed and pt could not sustain weight. Pt gently lowered to floor. Tenor used to lift pt back up onto bed.   ADL Comments decreased functional status  Bed Mobility  Bed Mobility Supine to Sit  Supine to Sit 2: Max assist  Details for Bed Mobility Assistance rolling to side with S  Transfers  Transfers Sit to Stand;Stand to Sit  Sit to Stand From bed;Other (comment) (see above. required use of lift equipment. )  Transfer via Manufacturing systems engineer  Details for Transfer Assistance see above.   OT - End of Session  Equipment Utilized During Treatment Gait belt  Activity Tolerance Patient tolerated treatment well  Patient left in bed;with call bell/phone within reach;with nursing/sitter in room  Nurse Communication Mobility status;Need for lift equipment  OT Assessment  OT Recommendation/Assessment All further OT needs can be met in the next venue of care  OT Problem List Decreased strength;Decreased range of motion;Decreased activity tolerance;Decreased knowledge of use  of DME or AE;Impaired sensation;Impaired tone;Obesity;Pain;Increased edema  OT Therapy Diagnosis  Generalized weakness;Other (comment) (chronic back pain)  OT Recommendation  Follow Up Recommendations SNF  OT Equipment None recommended by OT  Individuals Consulted  Consulted and Agree with  Results and Recommendations Patient  OT Time Calculation  OT Start Time 1531  OT Stop Time 1613  OT Time Calculation (min) 42 min  OT G-codes **NOT FOR INPATIENT CLASS**  Functional Assessment Tool Used clinical judgement  Functional Limitation Self care  Self Care Current Status (Z6109) CL  Self Care Goal Status (U0454) CL  Self Care Discharge Status (U9811) CL  OT General Charges  $OT Visit 1 Procedure  OT Evaluation  $Initial OT Evaluation Tier I 1 Procedure  OT Treatments  $Self Care/Home Management  8-22 mins  [facility], OTR/L  [phone] 11/10/2013

## 2013-11-10 NOTE — Progress Notes (Signed)
Met Patient at bedside.Role of Case Manager explained.Patient reports he resides at home with his daughter who works Monday-Friday.Patient reports the reason he came to  ED today was secondary a fall.Patient reports a history of spinal stenosis and reports he is finding it hard to cope at home alone.Patient reports he has an Runner, broadcasting/film/video chair  Patient states he may need a facility.This CM provided education on Home health services and the CHOICE LIST.Patient reports if he is discharged home with Home health he [facility] services explained to patient.Case Manager will speak with patients primary nurse regarding a PT order and will liaise with Corrie Dandy( [facility] Liaison) re possible services should patient be discharged with home health orders.Teach back method used to ensure patient understands plan of care.CM will  Also call Social work as patient did state he was also considering a facility due to not coping well at home.

## 2013-11-10 NOTE — Progress Notes (Signed)
Case manager placed call to [facility] care liaison.She is aware patient elected [facility] as there home health provider should they be discharged home.Await PT eval. Corrie Dandy will monitor EPIC re Plan.

## 2013-11-10 NOTE — ED Notes (Signed)
Patient states he has a history of spinal stenosis had back surgery Jan. 2013 was in the hosiptal for 4 months went to rehab. Was able to walk some with a walker, however Sept. 1 fell. And states since 2 weeks ago wasn't able to use his legs can't control bowel or bladder scheduled for MRI on thurs.

## 2013-11-10 NOTE — ED Provider Notes (Signed)
CSN: [id number]     Arrival date & time 11/10/13  0608 History   First MD Initiated Contact with Patient 11/10/13 [phone]     Chief Complaint  Patient presents with  . Fall   (Consider location/radiation/quality/duration/timing/severity/associated sxs/prior Treatment) HPI Comments: Patient is a 47 year old male who presents to the ED via EMS after a fall that occurred prior to arrival. Patient reports trying to use his urinal this morning and rolled out of bed. Patient reports his legs his the ground first, then his back side and then he landed flat on his back. Patient reports a history of thoracic spine pain that became acutely worse after the fall this morning. The pain is aching and severe without radiation. Patient did not try anything for pain. Patient reports being unable to get himself off the ground so he called EMS. No other injury. No head trauma or LOC.    Past Medical History  Diagnosis Date  . Arthritis   . Clotting disorder   . Hypertension   . Pulmonary embolism     x 3 in 2005, 2008, 2010  . DVT (deep venous thrombosis)     Summer 2012  . Spinal stenosis    Past Surgical History  Procedure Laterality Date  . Spine surgery  11/30/11    Total 6 back surgeries   Family History  Problem Relation Age of Onset  . Cancer Mother 32    Lung  . Hypertension Father   . Diabetes Father    History  Substance Use Topics  . Smoking status: Former Smoker -- 1.50 packs/day for 10 years    Types: Cigarettes    Quit date: 04/23/2001  . Smokeless tobacco: Never Used  . Alcohol Use: 0.5 oz/week    1 drink(s) per week    Review of Systems  Constitutional: Negative for fever, chills and fatigue.  HENT: Negative for trouble swallowing.   Eyes: Negative for visual disturbance.  Respiratory: Negative for shortness of breath.   Cardiovascular: Negative for chest pain and palpitations.  Gastrointestinal: Negative for nausea, vomiting, abdominal pain and diarrhea.  Genitourinary:  Negative for dysuria and difficulty urinating.  Musculoskeletal: Positive for back pain. Negative for arthralgias and neck pain.  Skin: Negative for color change.  Neurological: Negative for dizziness and weakness.  Psychiatric/Behavioral: Negative for dysphoric mood.    Allergies  Lisinopril  Home Medications   Current Outpatient Rx  Name  Route  Sig  Dispense  Refill  . ammonium lactate (AMLACTIN) 12 % cream   Topical   Apply 1 g topically 2 (two) times daily.         . diazepam (VALIUM) 10 MG tablet      Take 1 tablet by mouth every 8 hours if needed for anxiety   60 tablet   0   . ketoconazole (NIZORAL) 2 % shampoo   Topical   Apply 1 application topically daily.         . metFORMIN (GLUCOPHAGE) 500 MG tablet   Oral   Take 500 mg by mouth daily with breakfast.         . metoprolol succinate (TOPROL-XL) 50 MG 24 hr tablet   Oral   Take 1 tablet (50 mg total) by mouth daily.   30 tablet   5   . oxyCODONE-acetaminophen (PERCOCET/ROXICET) 5-325 MG per tablet      Take 1-2 tablets as needed every 6 hours for severe pain.   60 tablet   0   .  potassium chloride SA (KLOR-CON M20) 20 MEQ tablet   Oral   Take 1 tablet (20 mEq total) by mouth 2 (two) times daily.   60 tablet   2   . sertraline (ZOLOFT) 50 MG tablet   Oral   Take 75 mg by mouth daily.         Marland Kitchen torsemide (DEMADEX) 20 MG tablet   Oral   Take 20 mg by mouth 2 (two) times daily.         . traZODone (DESYREL) 100 MG tablet   Oral   Take 100 mg by mouth at bedtime.         Marland Kitchen warfarin (COUMADIN) 10 MG tablet   Oral   Take 5-10 mg by mouth daily. Takes 10mg  on Tues, Wed, Thurs, Fri, Sun  Takes 5mg  Sat and Mon          BP 154/71  Pulse 73  Temp(Src) 97.9 F (36.6 C) (Oral)  Resp 20  Ht 5\' 10"  (1.778 m)  Wt 375 lb (170.099 kg)  BMI 53.81 kg/m2  SpO2 97% Physical Exam  Nursing note and vitals reviewed. Constitutional: He is oriented to person, place, and time. He appears  well-developed and well-nourished. No distress.  Patient is morbidly obese.   HENT:  Head: Normocephalic and atraumatic.  Eyes: Conjunctivae and EOM are normal.  Neck: Normal range of motion.  Cardiovascular: Normal rate and regular rhythm.  Exam reveals no gallop and no friction rub.   No murmur heard. Pulmonary/Chest: Effort normal and breath sounds normal. He has no wheezes. He has no rales. He exhibits no tenderness.  Abdominal: Soft. He exhibits no distension. There is no tenderness. There is no rebound and no guarding.  Genitourinary: Rectum normal.  Good rectal tone.   Musculoskeletal: Normal range of motion.  T10-T12 tenderness to palpation. Thoracic paraspinal tenderness to palpation.   Neurological: He is alert and oriented to person, place, and time. Coordination normal.  Speech is goal-oriented. Moves limbs without ataxia.   Skin: Skin is warm and dry.  Psychiatric: He has a normal mood and affect. His behavior is normal.    ED Course  Procedures (including critical care time) Labs Review Labs Reviewed - No data to display Imaging Review Dg Thoracic Spine 2 View  11/10/2013   CLINICAL DATA:  Fall  EXAM: THORACIC SPINE - 2 VIEW  COMPARISON:  11/26/ 0 9  FINDINGS: Three views of thoracic spine submitted. No acute fracture or subluxation. Mild degenerative changes. Alignment and vertebral height are preserved.  IMPRESSION: No acute fracture or subluxation.  Mild degenerative changes.   Electronically Signed   By: Natasha Mead M.D.   On: 11/10/2013 07:32    EKG Interpretation   None       MDM   1. Fall, initial encounter   2. Back injury, initial encounter     6:45 AM Xray of thoracic spine pending. Patient will have Percocet and Flexeril for pain. No bladder/bowel incontinence or saddle paresthesias. Vitals stable and patient afebrile.   8:48 AM Patient's xray shows no acute fracture or other changes. Patient will be discharged with instructions to follow up with  his PCP. Patient will be discharged with Percocet for pain. No further evaluation needed at this time.   Patient will be moved to Pod C pending social work consult for nursing home placement per patient request.   Emilia Beck, PA-C 11/10/13 1428

## 2013-11-10 NOTE — Evaluation (Signed)
Physical Therapy Evaluation Patient Details Name: Nicolas [name] MRN: [id number] DOB: 02-01-1966 Today's Date: 11/10/2013 Time: 4098-1191 PT Time Calculation (min): 51 min  PT Assessment / Plan / Recommendation History of Present Illness  47 yo s/p multiple back surgeries with recent fall at home, requiring EMS to lift from floor.   Clinical Impression  Pt came to ED s/p fall out of bed. On evaluation, pt noted to have extensor tone in bil LEs (Rt more than Lt). Pt unable to stand with RW (as he has been doing at home with daughter) and was lowered to the floor. Pt currently with functional limitations due to the deficits listed below (see PT Problem List).  Pt will benefit from skilled PT to increase his independence and safety with mobility to allow discharge to the venue listed below.       PT Assessment  Patient needs continued PT services    Follow Up Recommendations  SNF    Does the patient have the potential to tolerate intense rehabilitation      Barriers to Discharge Decreased caregiver support daughter works days    Equipment Recommendations   (if home, mechanical lift for bed to chair)    Recommendations for Other Services     Frequency Min 2X/week    Precautions / Restrictions Precautions Precautions: Fall   Pertinent Vitals/Pain After lowered to floor (tall kneeling), pt reported pain in Rt toes. Noted pt to have small amount of blood under his 3rd toenail--RN made aware and PA in to assess.      Mobility  Bed Mobility Bed Mobility: Rolling Right;Rolling Left Rolling Right: With rail;4: Min assist Rolling Left: 3: Mod assist;With rail Supine to Sit: 2: Max assist Details for Bed Mobility Assistance: OT assisted pt to sitting while PT out of room to obtain wide RW Transfers Transfers: Sit to Stand Sit to Stand: 1: +2 Total assist;From bed;From elevated surface Sit to Stand: Patient Percentage: 50% Transfer via Lift Equipment: Loss adjuster, chartered Details for  Transfer Assistance: pt stood from stretcher prematurely (when asked to scoot Lt hip forward to get his foot on the floor); pt pushing up with bil UEs on wide RW, however Lt knee unable to extend/support him (?flexor withdrawal as his foot was nearly under bed behind him as he lowered down to his knees; pt assisted to quadruped, to Rt hip/side sitting and then to Rt sidelying. Nursing called for assist and pt lifted floor to bed with Tenor lift    Exercises     PT Diagnosis: Difficulty walking  PT Problem List: Decreased strength;Decreased range of motion;Decreased balance;Decreased mobility;Decreased knowledge of use of DME;Decreased safety awareness;Impaired sensation;Impaired tone;Obesity PT Treatment Interventions: DME instruction;Functional mobility training;Therapeutic activities;Therapeutic exercise;Balance training;Patient/family education     PT Goals(Current goals can be found in the care plan section) Acute Rehab PT Goals Patient Stated Goal: get an MRI of his thoracic spine and figure out what is wrong with his legs PT Goal Formulation: With patient Time For Goal Achievement: 11/17/13 Potential to Achieve Goals: Good  Visit Information  Last PT Received On: 11/10/13 Assistance Needed: +3 or more PT/OT/SLP Co-Evaluation/Treatment: Yes Reason for Co-Treatment: For patient/therapist safety PT goals addressed during session: Mobility/safety with mobility;Proper use of DME History of Present Illness: 47 yo s/p multiple back surgeries with recent fall at home, requiring EMS to lift from floor.        Prior Functioning  Home Living Family/patient expects to be discharged to:: Skilled nursing facility Prior Function Level  of Independence: Needs assistance Gait / Transfers Assistance Needed: per pt his ability to stand-pivot with walker to/from his "hover-round" has steadily declined since a fall 9/1. States he feels like his legs are not under his control, especially the  right ADL's / Homemaking Assistance Needed: Mod A with ADL (bed level) Comments: daughter assists pt OOB to w/c, where pt spends his entire day until his daughter returns home from work. Pt has had recent fall at home. Reports increased difficulty moving and controlling BLE. Communication Communication: No difficulties    Cognition  Cognition Arousal/Alertness: Awake/alert Behavior During Therapy: WFL for tasks assessed/performed;Impulsive Overall Cognitive Status: Within Functional Limits for tasks assessed    Extremity/Trunk Assessment Upper Extremity Assessment Upper Extremity Assessment: Overall WFL for tasks assessed Lower Extremity Assessment Lower Extremity Assessment: RLE deficits/detail;LLE deficits/detail RLE Deficits / Details: +extensor tone in supine with great toe hyperextending and ankle PF as pt exerts effort to try to move his leg; AAROM in supine: ankle DF -30 degrees, knee flexion 100: AAROM in sitting (decr extensor tone) ankle DF to -10, knee flexion 110; strength hip flex 2+, knee extension 2+ RLE Sensation: history of peripheral neuropathy (absent in foot (to pain/deep pressure)) LLE Deficits / Details: +extensor tone in supine with great toe hyperextending and ankle PF as pt exerts effort to try to move his leg (although less so than Rt); AAROM in supine: ankle DF -10 degrees, knee flexion 110: AAROM in sitting (decr extensor tone) ankle DF to 0, knee flexion 110; strength hip flex 3-, Knee extension 3- LLE Sensation: history of peripheral neuropathy (able to detect medium to deep pressure in foot) Cervical / Trunk Assessment Cervical / Trunk Assessment: Normal   Balance Balance Balance Assessed: Yes Static Sitting Balance Static Sitting - Balance Support: Right upper extremity supported;Feet unsupported Static Sitting - Level of Assistance: 5: Stand by assistance Dynamic Sitting Balance Dynamic Sitting - Balance Support: Right upper extremity supported;Feet  unsupported Dynamic Sitting - Level of Assistance: 4: Min assist (pt anxious with dynamic mvment and grabbing to hold on)  End of Session PT - End of Session Equipment Utilized During Treatment: Gait belt Activity Tolerance: Other (comment) (limited by leg weakness) Patient left: in bed;with call bell/phone within reach Nurse Communication: Mobility status;Other (comment) (Rt 3rd toenail bleeding)  GP Functional Assessment Tool Used: clinical judgement Functional Limitation: Mobility: Walking and moving around Mobility: Walking and Moving Around Current Status [phone]): At least 80 percent but less than 100 percent impaired, limited or restricted Mobility: Walking and Moving Around Goal Status [phone]): At least 1 percent but less than 20 percent impaired, limited or restricted   Jagjit Riner 11/10/2013, 5:38 PM Pager [phone]

## 2013-11-10 NOTE — ED Notes (Signed)
Clinical Social Work Department CLINICAL SOCIAL WORK PLACEMENT NOTE 11/10/2013  Patient:  ADEEL, GUIFFRE  Account Number:  [id number] Admit date:  11/10/2013  Clinical Social Worker:  Pollyann Savoy, LCSW  Date/time:  11/10/2013 08:10 PM  Clinical Social Work is seeking post-discharge placement for this patient at the following level of care:   SKILLED NURSING   (*CSW will update this form in Epic as items are completed)   11/10/2013  Patient/family provided with [facility] Department of Clinical Social Work's list of facilities offering this level of care within the geographic area requested by the patient (or if unable, by the patient's family).  11/10/2013  Patient/family informed of their freedom to choose among providers that offer the needed level of care, that participate in Medicare, Medicaid or managed care program needed by the patient, have an available bed and are willing to accept the patient.  11/10/2013  Patient/family informed of [facility]' ownership interest in [facility], as well as of the fact that they are under no obligation to receive care at this facility.  PASARR submitted to EDS on 11/10/2013 PASARR number received from EDS on 11/10/2013  FL2 transmitted to all facilities in geographic area requested by pt/family on  11/10/2013 FL2 transmitted to all facilities within larger geographic area on   Patient informed that his/her managed care company has contracts with or will negotiate with  certain facilities, including the following:     Patient/family informed of bed offers received:   Patient chooses bed at  Physician recommends and patient chooses bed at    Patient to be transferred to  on   Patient to be transferred to facility by   The following physician request were entered in Epic:   Additional Comments:

## 2013-11-10 NOTE — ED Notes (Addendum)
CALLED PT TO CHECK ON DELAY IN PT EVALUATION [phone]/ [phone]

## 2013-11-10 NOTE — ED Notes (Signed)
Pt here to evaluate patient

## 2013-11-10 NOTE — Clinical Social Work Psychosocial (Signed)
Clinical Social Work Department BRIEF PSYCHOSOCIAL ASSESSMENT 11/10/2013  Patient:  Nicolas [name], Nicolas [name]     Account Number:  [id number]     Admit date:  11/10/2013  Clinical Social Worker:  Nicolas [name]  Date/Time:  11/10/2013 12:37 PM  Referred by:  RN  Date Referred:  11/10/2013 Referred for  SNF Placement   Other Referral:   Interview type:  Patient Other interview type:    PSYCHOSOCIAL DATA Living Status:  FAMILY Admitted from facility:   Level of care:   Primary support name:  Nicolas [name] Primary support relationship to patient:  CHILD, ADULT Degree of support available:   Daughter Nicolas [name], age 47 lives with patient. Nicolas [name] also has a significant other, Nicolas [name]. Patient stated that daughter is away from home during the day.    CURRENT CONCERNS Current Concerns  Post-Acute Placement   Other Concerns:    SOCIAL WORK ASSESSMENT / PLAN CSW contacted to talk with patient as he requested SNF placement, due to problems ambulating, etc. CSW visited with patient in the ER (Pod C, room 29) and talked with him regarding his request. Nicolas [name] is requesting to receive rehab and reports that he is primarily in his wheelchair and has not stood since August.    When asked, patient stated that he has been to inpatient rehab at [facility] and to a SNF in [place] for rehab. CSW explained SNF search process and wants a facility in [facility] Patient was given SNF list for [facility]. CSW also informed patient that his insurance will receive clinicals and will have to authorize placement prior to him discharging to a facility and patient expressed understanding.   Assessment/plan status:  Psychosocial Support/Ongoing Assessment of Needs Other assessment/ plan:   CSW received a call from Nicolas [name] with case management regarding patient. She talked with Nicolas [name] regarding the option of dishcarging home with [facility] services and he is open to this option. CSW also  informed by case manager of patient's statement of not standing for 8 months. CSW advised that PT eval requested and CSW also requested an OT evaluation and H&P.   Information/referral to community resources:   Patient given SNF list for [facility]    PATIENT'S/FAMILY'S RESPONSE TO PLAN OF CARE: Patient requested to speak with CSW and was receptive to talking about being placed for rehab.

## 2013-11-11 LAB — PROTIME-INR
INR: 1.78 — ABNORMAL HIGH (ref 0.00–1.49)
Prothrombin Time: 20.2 seconds — ABNORMAL HIGH (ref 11.6–15.2)

## 2013-11-11 LAB — GLUCOSE, CAPILLARY
Glucose-Capillary: 192 mg/dL — ABNORMAL HIGH (ref 70–99)
Glucose-Capillary: 121 mg/dL — ABNORMAL HIGH (ref 70–99)

## 2013-11-11 MED ORDER — WARFARIN SODIUM 10 MG PO TABS
10.0000 mg | ORAL_TABLET | ORAL | Status: DC
  Filled 2013-11-11: qty 1

## 2013-11-11 MED ORDER — OXYCODONE-ACETAMINOPHEN 5-325 MG PO TABS
2.0000 | ORAL_TABLET | Freq: Four times a day (QID) | ORAL | Status: DC | PRN
  Administered 2013-11-11 – 2013-11-12 (×5): 2 via ORAL
  Filled 2013-11-11 (×5): qty 2

## 2013-11-11 MED ORDER — WARFARIN SODIUM 5 MG PO TABS: 5.0000 mg | ORAL_TABLET | ORAL | Status: DC

## 2013-11-11 MED ORDER — WARFARIN SODIUM 10 MG PO TABS
10.0000 mg | ORAL_TABLET | Freq: Once | ORAL | Status: AC
  Administered 2013-11-11: 10 mg via ORAL
  Filled 2013-11-11: qty 1

## 2013-11-11 NOTE — ED Notes (Signed)
BARIATRIC BED REQUESTED DUE TO PT COMPLAINING OF PAIN TO HIS BOTTOM FROM BEING IN BED

## 2013-11-11 NOTE — ED Notes (Signed)
Pt. Reports that he does not want to shower presently.  Pt. Reports having difficulty getting up and walking due to his nerve pain.  Pt. Will bathe at the bedside later this afternoon.

## 2013-11-11 NOTE — ED Provider Notes (Signed)
Medical screening examination/treatment/procedure(s) were performed by non-physician practitioner and as supervising physician I was immediately available for consultation/collaboration.   Martie Fulgham, MD 11/11/13 0552 

## 2013-11-11 NOTE — ED Notes (Signed)
Updated pt on plan of care/insurance approval.  Pt concerned about his MRI appointment tomorrow at 12:30 pm.  CSW discussed with [facility], who will call [facility] and reschedule on behalf of pt.  CSW to f/u with [employer] in am re: authorization.

## 2013-11-11 NOTE — ED Notes (Signed)
SPOKE WITH SOCIAL WORKER JESSE AND SHE ADVISES THEY ARE STILL AWAITING INSURANCE APPROVAL . THEY  HAVE FAXED OUT PT INFO BUT HAVE NOT HAD ANY BED OFFERS TO THIS POINT.

## 2013-11-11 NOTE — Progress Notes (Signed)
PHARMACIST - PHYSICIAN COMMUNICATION DR:  Ranae Palms or ED physician CONCERNING: Pharmacy Care Issues Regarding Warfarin Labs  RECOMMENDATION (Action Taken): A baseline and daily protime for three days has been ordered to meet the [facility] Patient safety goal and comply with the current [facility] & Therapeutics Committee policy.   The Pharmacy will defer all warfarin dose order changes and follow up of lab results to the prescriber unless an additional order to initiate a "pharmacy Coumadin consult" is placed.  DESCRIPTION:  While hospitalized, to be in compliance with The Joint Commission National Patient Safety Goals, all patients on warfarin must have a baseline and/or current protime prior to the administration of warfarin. Pharmacy has received your order for warfarin without these required laboratory assessments.

## 2013-11-11 NOTE — ED Notes (Signed)
Snack given with diet coke

## 2013-11-11 NOTE — Clinical Social Work Note (Signed)
Clinical Social Worker continuing to follow patient for support and discharge planning needs.  CSW spoke with patient at bedside who is agreeable to bed offer at [facility].  Clinicals submitted to facility and awaiting insurance approval from [place].  CSW left message with insurance liaison, Caleen Essex.  CSW will remain available for support and to facilitate patient discharge needs once medically stable.  Macario Golds, [place] [phone]

## 2013-11-12 ENCOUNTER — Other Ambulatory Visit: Payer: Medicare PPO

## 2013-11-12 ENCOUNTER — Emergency Department (HOSPITAL_COMMUNITY): Payer: Medicare PPO

## 2013-11-12 LAB — PROTIME-INR
INR: 1.78 — ABNORMAL HIGH (ref 0.00–1.49)
Prothrombin Time: 20.2 seconds — ABNORMAL HIGH (ref 11.6–15.2)

## 2013-11-12 NOTE — ED Provider Notes (Signed)
cxr negative Pt stable, well appearing, eating a meal Stable for d/c and placement He can continue meds as instructed, f/u with PCP in one week  Joya Gaskins, MD 11/12/13 1245

## 2013-11-12 NOTE — ED Notes (Signed)
Xray completed at bedside

## 2013-11-12 NOTE — ED Notes (Signed)
ptr has arrived to transport pt to nursing home 

## 2013-11-12 NOTE — ED Notes (Signed)
Spoke with pt re: tx to [facility].  Pt agreeable to tx and CSW called [facility].

## 2013-11-12 NOTE — ED Notes (Signed)
Called report to stacy at [facility].

## 2013-11-16 LAB — GLUCOSE, CAPILLARY: Glucose-Capillary: 136 mg/dL — ABNORMAL HIGH (ref 70–99)

## 2013-11-27 ENCOUNTER — Telehealth: Payer: Self-pay

## 2013-11-27 ENCOUNTER — Emergency Department (HOSPITAL_COMMUNITY): Payer: Medicare PPO

## 2013-11-27 ENCOUNTER — Emergency Department (HOSPITAL_COMMUNITY)
Admission: EM | Admit: 2013-11-27 | Discharge: 2013-11-28 | Disposition: A | Discharge status: Home / Self Care | Payer: Medicare PPO | Attending: Emergency Medicine | Admitting: Emergency Medicine

## 2013-11-27 ENCOUNTER — Encounter (HOSPITAL_COMMUNITY): Payer: Self-pay | Admitting: Emergency Medicine

## 2013-11-27 DIAGNOSIS — M129 Arthropathy, unspecified: Secondary | ICD-10-CM | POA: Insufficient documentation

## 2013-11-27 DIAGNOSIS — Z86718 Personal history of other venous thrombosis and embolism: Secondary | ICD-10-CM | POA: Insufficient documentation

## 2013-11-27 DIAGNOSIS — K5900 Constipation, unspecified: Secondary | ICD-10-CM

## 2013-11-27 DIAGNOSIS — Z87891 Personal history of nicotine dependence: Secondary | ICD-10-CM | POA: Insufficient documentation

## 2013-11-27 DIAGNOSIS — Z7901 Long term (current) use of anticoagulants: Secondary | ICD-10-CM | POA: Insufficient documentation

## 2013-11-27 DIAGNOSIS — G8929 Other chronic pain: Secondary | ICD-10-CM

## 2013-11-27 DIAGNOSIS — M6289 Other specified disorders of muscle: Secondary | ICD-10-CM | POA: Insufficient documentation

## 2013-11-27 DIAGNOSIS — M545 Low back pain, unspecified: Secondary | ICD-10-CM | POA: Insufficient documentation

## 2013-11-27 DIAGNOSIS — R143 Flatulence: Secondary | ICD-10-CM

## 2013-11-27 DIAGNOSIS — M546 Pain in thoracic spine: Principal | ICD-10-CM

## 2013-11-27 DIAGNOSIS — R142 Eructation: Secondary | ICD-10-CM | POA: Insufficient documentation

## 2013-11-27 DIAGNOSIS — Z862 Personal history of diseases of the blood and blood-forming organs and certain disorders involving the immune mechanism: Secondary | ICD-10-CM | POA: Insufficient documentation

## 2013-11-27 DIAGNOSIS — R141 Gas pain: Secondary | ICD-10-CM | POA: Insufficient documentation

## 2013-11-27 DIAGNOSIS — Z86711 Personal history of pulmonary embolism: Secondary | ICD-10-CM | POA: Insufficient documentation

## 2013-11-27 DIAGNOSIS — Z79899 Other long term (current) drug therapy: Secondary | ICD-10-CM | POA: Insufficient documentation

## 2013-11-27 DIAGNOSIS — G8220 Paraplegia, unspecified: Secondary | ICD-10-CM | POA: Insufficient documentation

## 2013-11-27 DIAGNOSIS — I10 Essential (primary) hypertension: Secondary | ICD-10-CM | POA: Insufficient documentation

## 2013-11-27 LAB — CBC WITH DIFFERENTIAL/PLATELET
Basophils Absolute: 0 10*3/uL (ref 0.0–0.1)
Basophils Relative: 0 % (ref 0–1)
Eosinophils Absolute: 0.2 10*3/uL (ref 0.0–0.7)
Eosinophils Relative: 3 % (ref 0–5)
HCT: 42.6 % (ref 39.0–52.0)
Hemoglobin: 13.8 g/dL (ref 13.0–17.0)
Lymphocytes Relative: 18 % (ref 12–46)
Lymphs Abs: 1.4 10*3/uL (ref 0.7–4.0)
MCH: 28.9 pg (ref 26.0–34.0)
MCHC: 32.4 g/dL (ref 30.0–36.0)
MCV: 89.3 fL (ref 78.0–100.0)
Monocytes Absolute: 0.6 10*3/uL (ref 0.1–1.0)
Monocytes Relative: 8 % (ref 3–12)
Neutro Abs: 5.7 10*3/uL (ref 1.7–7.7)
Neutrophils Relative %: 71 % (ref 43–77)
Platelets: 220 10*3/uL (ref 150–400)
RBC: 4.77 MIL/uL (ref 4.22–5.81)
RDW: 16.1 % — ABNORMAL HIGH (ref 11.5–15.5)
WBC: 8 10*3/uL (ref 4.0–10.5)

## 2013-11-27 LAB — POCT I-STAT, CHEM 8
BUN: 16 mg/dL (ref 6–23)
Calcium, Ion: 1.13 mmol/L (ref 1.12–1.23)
Chloride: 102 mEq/L (ref 96–112)
Creatinine, Ser: 0.9 mg/dL (ref 0.50–1.35)
Glucose, Bld: 188 mg/dL — ABNORMAL HIGH (ref 70–99)
HCT: 44 % (ref 39.0–52.0)
Hemoglobin: 15 g/dL (ref 13.0–17.0)
Potassium: 5.4 mEq/L — ABNORMAL HIGH (ref 3.7–5.3)
Sodium: 139 mEq/L (ref 137–147)
TCO2: 30 mmol/L (ref 0–100)

## 2013-11-27 LAB — URINALYSIS, ROUTINE W REFLEX MICROSCOPIC
Bilirubin Urine: NEGATIVE
Glucose, UA: 250 mg/dL — AB
Hgb urine dipstick: NEGATIVE
Ketones, ur: NEGATIVE mg/dL
Leukocytes, UA: NEGATIVE
Nitrite: NEGATIVE
Protein, ur: NEGATIVE mg/dL
Specific Gravity, Urine: 1.022 (ref 1.005–1.030)
Urobilinogen, UA: 1 mg/dL (ref 0.0–1.0)
pH: 7 (ref 5.0–8.0)

## 2013-11-27 LAB — OCCULT BLOOD, POC DEVICE: Fecal Occult Bld: NEGATIVE

## 2013-11-27 LAB — PROTIME-INR
INR: 1.96 — ABNORMAL HIGH (ref 0.00–1.49)
Prothrombin Time: 21.7 seconds — ABNORMAL HIGH (ref 11.6–15.2)

## 2013-11-27 MED ORDER — DIAZEPAM 5 MG/ML IJ SOLN
5.0000 mg | Freq: Once | INTRAMUSCULAR | Status: AC
  Administered 2013-11-27: 5 mg via INTRAVENOUS
  Filled 2013-11-27: qty 2

## 2013-11-27 MED ORDER — IOHEXOL 300 MG/ML  SOLN
20.0000 mL | INTRAMUSCULAR | Status: AC
  Administered 2013-11-27 (×2): 20 mL via ORAL

## 2013-11-27 MED ORDER — IOHEXOL 300 MG/ML  SOLN
100.0000 mL | Freq: Once | INTRAMUSCULAR | Status: AC | PRN
  Administered 2013-11-27: 100 mL via INTRAVENOUS

## 2013-11-27 NOTE — ED Notes (Signed)
Pt reports he is able to eat and drink normally.

## 2013-11-27 NOTE — Telephone Encounter (Signed)
Called him to advise.  

## 2013-11-27 NOTE — ED Notes (Signed)
Patient transported to CT 

## 2013-11-27 NOTE — ED Provider Notes (Addendum)
CSN: [id number]     Arrival date & time 11/27/13  2030 History   First MD Initiated Contact with Patient 11/27/13 2105     Chief Complaint  Patient presents with  . Constipation  . Back Pain   (Consider location/radiation/quality/duration/timing/severity/associated sxs/prior Treatment) Patient is a 48 y.o. male presenting with constipation and back pain.  Constipation Associated symptoms: abdominal pain and back pain   Back Pain Associated symptoms: abdominal pain    Complains of constipation, ntun able to have a bowel movement for the past 3 weeks. His abdomen is distended. Also complains of low back pain and abdominal pain for several weeks. Denies shortness of breath denies chest pain. Denies fever. No vomiting or nausea. He treated himself with Percocet however stopped the Percocet approximately 3 days ago as he thought might be contributing to his constipation Past Medical History  Diagnosis Date  . Arthritis   . Clotting disorder   . Hypertension   . Pulmonary embolism     x 3 in 2005, 2008, 2010  . DVT (deep venous thrombosis)     Summer 2012  . Spinal stenosis    Past Surgical History  Procedure Laterality Date  . Spine surgery  11/30/11    Total 6 back surgeries   Family History  Problem Relation Age of Onset  . Cancer Mother 52    Lung  . Hypertension Father   . Diabetes Father    History  Substance Use Topics  . Smoking status: Former Smoker -- 1.50 packs/day for 10 years    Types: Cigarettes    Quit date: 04/23/2001  . Smokeless tobacco: Never Used  . Alcohol Use: 0.5 oz/week    1 drink(s) per week    Review of Systems  Cardiovascular: Positive for leg swelling.       Chronic leg edema  Gastrointestinal: Positive for abdominal pain and constipation.  Musculoskeletal: Positive for back pain.  Neurological:       Paraplegic  All other systems reviewed and are negative.    Allergies  Lisinopril  Home Medications   Current Outpatient Rx  Name   Route  Sig  Dispense  Refill  . acetaminophen (TYLENOL) 500 MG tablet   Oral   Take 500 mg by mouth every 6 (six) hours as needed.         Marland Kitchen ammonium lactate (AMLACTIN) 12 % cream   Topical   Apply 1 g topically 2 (two) times daily.         . diazepam (VALIUM) 10 MG tablet      Take 1 tablet by mouth every 8 hours if needed for anxiety   60 tablet   0   . ketoconazole (NIZORAL) 2 % shampoo   Topical   Apply 1 application topically daily.         . metFORMIN (GLUCOPHAGE) 500 MG tablet   Oral   Take 500 mg by mouth daily with breakfast.         . metoprolol succinate (TOPROL-XL) 50 MG 24 hr tablet   Oral   Take 1 tablet (50 mg total) by mouth daily.   30 tablet   5   . oxyCODONE-acetaminophen (PERCOCET/ROXICET) 5-325 MG per tablet      Take 1-2 tablets as needed every 6 hours for severe pain.   60 tablet   0   . potassium chloride SA (KLOR-CON M20) 20 MEQ tablet   Oral   Take 1 tablet (20 mEq total) by mouth  2 (two) times daily.   60 tablet   2   . sertraline (ZOLOFT) 50 MG tablet   Oral   Take 75 mg by mouth daily.         Marland Kitchen torsemide (DEMADEX) 20 MG tablet   Oral   Take 20 mg by mouth 2 (two) times daily.         . traZODone (DESYREL) 100 MG tablet   Oral   Take 100 mg by mouth at bedtime.         Marland Kitchen warfarin (COUMADIN) 10 MG tablet   Oral   Take 5-10 mg by mouth daily. Takes 10mg  on Tues, Wed, Thurs, Fri, Sun  Takes 5mg  Sat and Mon          BP 161/73  Pulse 102  Temp(Src) 99.1 F (37.3 C) (Oral)  Resp 22  SpO2 98% Physical Exam  Nursing note and vitals reviewed. Constitutional: He appears well-developed and well-nourished.  HENT:  Head: Normocephalic and atraumatic.  Eyes: Conjunctivae are normal. Pupils are equal, round, and reactive to light.  Neck: Neck supple. No tracheal deviation present. No thyromegaly present.  Cardiovascular: Normal rate and regular rhythm.   No murmur heard. Pulmonary/Chest: Effort normal and breath  sounds normal.  Abdominal: Soft. Bowel sounds are normal. He exhibits no distension. There is no tenderness.  Morbidly obese mild tenderness at epigastrium patient has no sensation below the epigastrium  Genitourinary:  Hard brown stool  Musculoskeletal: Normal range of motion. He exhibits no edema and no tenderness.  Neurological: He is alert. Coordination normal.  Skin: Skin is warm and dry. No rash noted.  Psychiatric: He has a normal mood and affect.    ED Course  Procedures (including critical care time) Labs Review Labs Reviewed  CBC WITH DIFFERENTIAL - Abnormal; Notable for the following:    RDW 16.1 (*)    All other components within normal limits  URINALYSIS, ROUTINE W REFLEX MICROSCOPIC - Abnormal; Notable for the following:    Glucose, UA 250 (*)    All other components within normal limits  POCT I-STAT, CHEM 8 - Abnormal; Notable for the following:    Potassium 5.4 (*)    Glucose, Bld 188 (*)    All other components within normal limits  PROTIME-INR  OCCULT BLOOD, POC DEVICE   Imaging Review No results found.  EKG Interpretation    Date/Time:  Friday November 27 2013 20:39:35 EST Ventricular Rate:  96 PR Interval:  172 QRS Duration: 69 QT Interval:  319 QTC Calculation: 403 R Axis:   12 Text Interpretation:  Age not entered, assumed to be  48 years old for purpose of ECG interpretation Sinus rhythm Probable left atrial enlargement Borderline repolarization abnormality Borderline ST elevation, lateral leads Baseline wander in lead(s) V1 Since last tracing rate slower Confirmed by Winfred Leeds  MD, Tekeyah Santiago ([id number]) on 11/28/2013 12:20:15 AM            Results for orders placed during the hospital encounter of 11/27/13  CBC WITH DIFFERENTIAL      Result Value Range   WBC 8.0  4.0 - 10.5 K/uL   RBC 4.77  4.22 - 5.81 MIL/uL   Hemoglobin 13.8  13.0 - 17.0 g/dL   HCT 42.6  39.0 - 52.0 %   MCV 89.3  78.0 - 100.0 fL   MCH 28.9  26.0 - 34.0 pg   MCHC 32.4  30.0 - 36.0  g/dL   RDW 16.1 (*) 11.5 - 15.5 %  Platelets 220  150 - 400 K/uL   Neutrophils Relative % 71  43 - 77 %   Neutro Abs 5.7  1.7 - 7.7 K/uL   Lymphocytes Relative 18  12 - 46 %   Lymphs Abs 1.4  0.7 - 4.0 K/uL   Monocytes Relative 8  3 - 12 %   Monocytes Absolute 0.6  0.1 - 1.0 K/uL   Eosinophils Relative 3  0 - 5 %   Eosinophils Absolute 0.2  0.0 - 0.7 K/uL   Basophils Relative 0  0 - 1 %   Basophils Absolute 0.0  0.0 - 0.1 K/uL  URINALYSIS, ROUTINE W REFLEX MICROSCOPIC      Result Value Range   Color, Urine YELLOW  YELLOW   APPearance CLEAR  CLEAR   Specific Gravity, Urine 1.022  1.005 - 1.030   pH 7.0  5.0 - 8.0   Glucose, UA 250 (*) NEGATIVE mg/dL   Hgb urine dipstick NEGATIVE  NEGATIVE   Bilirubin Urine NEGATIVE  NEGATIVE   Ketones, ur NEGATIVE  NEGATIVE mg/dL   Protein, ur NEGATIVE  NEGATIVE mg/dL   Urobilinogen, UA 1.0  0.0 - 1.0 mg/dL   Nitrite NEGATIVE  NEGATIVE   Leukocytes, UA NEGATIVE  NEGATIVE  PROTIME-INR      Result Value Range   Prothrombin Time 21.7 (*) 11.6 - 15.2 seconds   INR 1.96 (*) 0.00 - 1.49  POCT I-STAT, CHEM 8      Result Value Range   Sodium 139  137 - 147 mEq/L   Potassium 5.4 (*) 3.7 - 5.3 mEq/L   Chloride 102  96 - 112 mEq/L   BUN 16  6 - 23 mg/dL   Creatinine, Ser 0.90  0.50 - 1.35 mg/dL   Glucose, Bld 188 (*) 70 - 99 mg/dL   Calcium, Ion 1.13  1.12 - 1.23 mmol/L   TCO2 30  0 - 100 mmol/L   Hemoglobin 15.0  13.0 - 17.0 g/dL   HCT 44.0  39.0 - 52.0 %  OCCULT BLOOD, POC DEVICE      Result Value Range   Fecal Occult Bld NEGATIVE  NEGATIVE   Dg Thoracic Spine 2 View  11/10/2013   CLINICAL DATA:  Fall  EXAM: THORACIC SPINE - 2 VIEW  COMPARISON:  11/26/ 0 9  FINDINGS: Three views of thoracic spine submitted. No acute fracture or subluxation. Mild degenerative changes. Alignment and vertebral height are preserved.  IMPRESSION: No acute fracture or subluxation.  Mild degenerative changes.   Electronically Signed   By: Lahoma Crocker M.D.   On:  11/10/2013 07:32   Ct Abdomen Pelvis W Contrast  11/27/2013   CLINICAL DATA:  Abdominal pain.  EXAM: CT ABDOMEN AND PELVIS WITH CONTRAST  TECHNIQUE: Multidetector CT imaging of the abdomen and pelvis was performed using the standard protocol following bolus administration of intravenous contrast.  CONTRAST:  162mL OMNIPAQUE IOHEXOL 300 MG/ML  SOLN  COMPARISON:  None.  FINDINGS: BODY WALL: Numerous subcutaneous venous collaterals related to IVC stenosis or occlusion.  LOWER CHEST: Small right pleural effusion. Mild basilar atelectasis or scarring.  ABDOMEN/PELVIS:  Liver: No focal abnormality.Diffuse fatty infiltration of the liver.  Biliary: No evidence of biliary obstruction or stone.  Pancreas: Unremarkable.  Spleen: Unremarkable.  Adrenals: Unremarkable.  Kidneys and ureters: No hydronephrosis or stone. 16 mm cyst in the lower pole left kidney.  Bladder: Unremarkable given decompressed state.  Reproductive: Unremarkable.  Bowel: No obstruction. Moderate volume of formed stool, predominantly  in the mid and distal colon, without evidence of proximal obstruction.  Retroperitoneum: No mass or adenopathy.  Peritoneum: No free fluid or gas.  Vascular: Infrarenal IVC filter. The filter and the cava is collapsed, with venous collaterals suggesting flow limiting stenosis. One of the medial to tines penetrates the wall of the neighboring infrarenal aorta.  OSSEOUS: Extensive facet osteoarthritis with posterior ligament ossification involving the lower thoracic and lumbar spine. There has been decompressive laminectomies from T8-T10. Bulky posterior ligament ossification and thickening continues to narrow the spinal canal, especially at the level of T7. Diffuse foraminal stenoses related to the same.  IMPRESSION: 1. No evidence of acute intra-abdominal disease. 2. 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 3. Spinal canal stenosis, most advanced at the level of T7, secondary to  dorsal ligamentous ossification.   Electronically Signed   By: Jorje Guild M.D.   On: 11/27/2013 23:19   Dg Chest Portable 1 View  11/12/2013   CLINICAL DATA:  Cough.  EXAM: PORTABLE CHEST - 1 VIEW  COMPARISON:  10/21/2008  FINDINGS: Portable semi upright view of the chest demonstrates low lung volumes. Surgical plate in the lower cervical spine. Densities at the right lung base could be related to overlying soft tissues. Heart size is prominent but likely accentuated by the low lung volumes and technique.  IMPRESSION: Low lung volumes and cannot exclude right basilar densities.   Electronically Signed   By: Markus Daft M.D.   On: 11/12/2013 11:40    11:15 PM patient resting more comfortably after treatment with intravenous Valium MDM  No diagnosis found.  Case management consult to arrange for home health needs for patient, such as hospital bed. He reports she's been sleepiness wheelchair for the past several days. He'll be held in the ED overnight until case management case evaluate him I've also spoken with radiology to try to get his MRI scan of thoracic spine arranged as an outpatient. Soap suds asthma ordered here Diagnosis #1 constipation #2 chronic pain #hyperglycemia   Orlie Dakin, MD 11/28/13 [id number]  Orlie Dakin, MD 11/28/13 0030

## 2013-11-27 NOTE — ED Notes (Signed)
MD at bedside. 

## 2013-11-27 NOTE — ED Notes (Signed)
Per EMS pt came from home c/o no BM for 3 weeks and decreased bowel sounds. Abdomen is obese, distended and firm. Pt reports he cannot feel below umbilicus.  Pt reports to RN that he has hx of spinal stenosis since 2014 and has lost control of bladder and was continent of bowel and usually went 2 times daily until 3 weeks ago. Pt has not been able to get MRI of back due to being overweight. Pt is non-ambulatory at home, uses wheel chair.

## 2013-11-27 NOTE — Telephone Encounter (Signed)
Please advise pt that I found documentation indicating that he was seen by Dr. Hazle Coca in the past; this physician is a neurosurgeon who performed surgery on neck. He also did surgery on thoracic spine back in 2004. I am placing a referral back to Dr. Luiz Ochoa.

## 2013-11-27 NOTE — Telephone Encounter (Signed)
Spoke to Dr Leward Quan regarding patient.  She advised me to let patient know she would refer him to a specialist and find out which doctor he had seen in the past.  Called patient he stated he has only been seen in the ER for his back pain.  I told him I would advise Dr Leward Quan of this and someone will call him back with an appointment to the specialist.

## 2013-11-27 NOTE — ED Notes (Signed)
Pt alert, NAD, calm, interactive, resps e/u, speaking in clear complete sentences. Rates pain 9/10.

## 2013-11-27 NOTE — Telephone Encounter (Signed)
Telephone call from Marion at [place].  They are unable to do the MRI for the patient because his weight exceeds the weight limit for the hoyer lift that they have access to.  He is unable to transfer to the MRI safe wheelchair. They tried to refer him to the hospital for the MRI but his weight exceeds their table. I told her I would discuss this with Dr. Leward Quan and call the patient.

## 2013-11-28 LAB — GLUCOSE, CAPILLARY: Glucose-Capillary: 125 mg/dL — ABNORMAL HIGH (ref 70–99)

## 2013-11-28 LAB — POCT PREGNANCY, URINE: Preg Test, Ur: NEGATIVE

## 2013-11-28 MED ORDER — TORSEMIDE 20 MG PO TABS
20.0000 mg | ORAL_TABLET | Freq: Two times a day (BID) | ORAL | Status: DC
  Administered 2013-11-28: 20 mg via ORAL
  Filled 2013-11-28 (×3): qty 1

## 2013-11-28 MED ORDER — OXYCODONE-ACETAMINOPHEN 5-325 MG PO TABS: 1.0000 | ORAL_TABLET | ORAL | Status: DC | PRN

## 2013-11-28 MED ORDER — WARFARIN SODIUM 5 MG PO TABS
5.0000 mg | ORAL_TABLET | ORAL | Status: DC
  Filled 2013-11-28: qty 1

## 2013-11-28 MED ORDER — SERTRALINE HCL 50 MG PO TABS: 75.0000 mg | ORAL_TABLET | Freq: Every day | ORAL | Status: DC

## 2013-11-28 MED ORDER — OXYCODONE-ACETAMINOPHEN 5-325 MG PO TABS
1.0000 | ORAL_TABLET | Freq: Four times a day (QID) | ORAL | Status: DC | PRN
  Administered 2013-11-28 (×2): 1 via ORAL
  Filled 2013-11-28 (×3): qty 1

## 2013-11-28 MED ORDER — WARFARIN SODIUM 10 MG PO TABS: 10.0000 mg | ORAL_TABLET | ORAL | Status: DC

## 2013-11-28 MED ORDER — TRAZODONE HCL 50 MG PO TABS: 100.0000 mg | ORAL_TABLET | Freq: Every day | ORAL | Status: DC

## 2013-11-28 MED ORDER — POTASSIUM CHLORIDE CRYS ER 20 MEQ PO TBCR
20.0000 meq | EXTENDED_RELEASE_TABLET | Freq: Two times a day (BID) | ORAL | Status: DC
  Administered 2013-11-28: 20 meq via ORAL
  Filled 2013-11-28: qty 1

## 2013-11-28 MED ORDER — WARFARIN - PHYSICIAN DOSING INPATIENT: Freq: Every day | Status: DC

## 2013-11-28 MED ORDER — WARFARIN SODIUM 5 MG PO TABS: 5.0000 mg | ORAL_TABLET | Freq: Every day | ORAL | Status: DC

## 2013-11-28 MED ORDER — METFORMIN HCL 500 MG PO TABS: 500.0000 mg | ORAL_TABLET | Freq: Every day | ORAL | Status: DC

## 2013-11-28 MED ORDER — DIAZEPAM 5 MG PO TABS: 10.0000 mg | ORAL_TABLET | Freq: Two times a day (BID) | ORAL | Status: DC | PRN

## 2013-11-28 MED ORDER — WARFARIN SODIUM 5 MG PO TABS: 5.0000 mg | ORAL_TABLET | ORAL | Status: DC

## 2013-11-28 MED ORDER — METOPROLOL SUCCINATE ER 50 MG PO TB24
50.0000 mg | ORAL_TABLET | Freq: Every day | ORAL | Status: DC
  Administered 2013-11-28: 50 mg via ORAL
  Filled 2013-11-28: qty 1

## 2013-11-28 MED ORDER — DOCUSATE SODIUM 100 MG PO CAPS: 100.0000 mg | ORAL_CAPSULE | Freq: Two times a day (BID) | ORAL | Status: DC

## 2013-11-28 NOTE — Progress Notes (Signed)
Case Manager spoke with patient and Patients girlfriend [employer].Both [name] and patient report they are unhappy with proposed discharge plan. This Case Manager explained that the patient has the option to Pay for a private sitter but declined this option.CM explained Home health services can resume on 1.5.15 and Hoyer lift and hospital bed will be delivered on the same date.[name] reports she will call [facility] To see what they can help with. Dr Wilson Singer will speak with the patient and updated re current plan.

## 2013-11-28 NOTE — ED Provider Notes (Signed)
Patient with paralysis since September.  Was to have MRI outpatient on Dec 1, but was told he was unable to be lifted onto the table.  Was d/c from rehab to home but did not have hospital bed.  Pt to have eval by case management in am, and MRI techs to speak to patient in AM about having it done outpatient-getting it arranged appropriately.  To have enema here.    Kalman Drape, MD 11/28/13 [phone]

## 2013-11-28 NOTE — ED Notes (Signed)
Pt cleaned of stool, bed linen changed as a team with nurse, chux applied, and double blue diaper applied.  Vitals taken and documented.

## 2013-11-28 NOTE — Progress Notes (Signed)
Case Manager placed a call to [facility] patients home health agency.Directed to out of hours voicemail.Message left for Jenny Reichmann out of hours coverage.Contacted  [employer] (Durable medical equipment ) [facility] bed will be delivered on 1.5.2015.Received return call from [facility]- out of hours Nurse) she reports she will request the nurse aid come in this Monday 1.5.2015 for a visit. Updated patient that Home care aid can come in a day early.Patient reports he will speak with his daughter.

## 2013-11-28 NOTE — ED Notes (Signed)
Patient aware of soap suds order would like to take it in the am with his am bath

## 2013-11-28 NOTE — Discharge Instructions (Signed)
Constipation, Adult Constipation is when a person has fewer than 3 bowel movements a week; has difficulty having a bowel movement; or has stools that are dry, hard, or larger than normal. As people grow older, constipation is more common. If you try to fix constipation with medicines that make you have a bowel movement (laxatives), the problem may get worse. Long-term laxative use may cause the muscles of the colon to become weak. A low-fiber diet, not taking in enough fluids, and taking certain medicines may make constipation worse. CAUSES   Certain medicines, such as antidepressants, pain medicine, iron supplements, antacids, and water pills.   Certain diseases, such as diabetes, irritable bowel syndrome (IBS), thyroid disease, or depression.   Not drinking enough water.   Not eating enough fiber-rich foods.   Stress or travel.  Lack of physical activity or exercise.  Not going to the restroom when there is the urge to have a bowel movement.  Ignoring the urge to have a bowel movement.  Using laxatives too much. SYMPTOMS   Having fewer than 3 bowel movements a week.   Straining to have a bowel movement.   Having hard, dry, or larger than normal stools.   Feeling full or bloated.   Pain in the lower abdomen.  Not feeling relief after having a bowel movement. DIAGNOSIS  Your caregiver will take a medical history and perform a physical exam. Further testing may be done for severe constipation. Some tests may include:   A barium enema X-ray to examine your rectum, colon, and sometimes, your small intestine.  A sigmoidoscopy to examine your lower colon.  A colonoscopy to examine your entire colon. TREATMENT  Treatment will depend on the severity of your constipation and what is causing it. Some dietary treatments include drinking more fluids and eating more fiber-rich foods. Lifestyle treatments may include regular exercise. If these diet and lifestyle recommendations  do not help, your caregiver may recommend taking over-the-counter laxative medicines to help you have bowel movements. Prescription medicines may be prescribed if over-the-counter medicines do not work.  HOME CARE INSTRUCTIONS   Increase dietary fiber in your diet, such as fruits, vegetables, whole grains, and beans. Limit high-fat and processed sugars in your diet, such as [place] fries, hamburgers, cookies, candies, and soda.   A fiber supplement may be added to your diet if you cannot get enough fiber from foods.   Drink enough fluids to keep your urine clear or pale yellow.   Exercise regularly or as directed by your caregiver.   Go to the restroom when you have the urge to go. Do not hold it.  Only take medicines as directed by your caregiver. Do not take other medicines for constipation without talking to your caregiver first. [place] IF:   You have bright red blood in your stool.   Your constipation lasts for more than 4 days or gets worse.   You have abdominal or rectal pain.   You have thin, pencil-like stools.  You have unexplained weight loss. MAKE SURE YOU:   Understand these instructions.  Will watch your condition.  Will get help right away if you are not doing well or get worse. Document Released: 08/10/2004 Document Revised: 02/04/2012 Document Reviewed: 10/16/2011 [facility] Patient Information 2014 [place], [place].   Emergency Department Resource Guide 1) Find a Doctor and Pay Out of Pocket Although you won't have to find out who is covered by your insurance plan, it is a good idea to ask  around and get recommendations. You will then need to call the office and see if the doctor you have chosen will accept you as a new patient and what types of options they offer for patients who are self-pay. Some doctors offer discounts or will set up payment plans for their patients who do not have insurance, but you will need to ask so you aren't  surprised when you get to your appointment.  2) Contact Your Local Health Department Not all health departments have doctors that can see patients for sick visits, but many do, so it is worth a call to see if yours does. If you don't know where your local health department is, you can check in your phone book. The CDC also has a tool to help you locate your state's health department, and many state websites also have listings of all of their local health departments.  3) Find a [facility] If your illness is not likely to be very severe or complicated, you may want to try a walk in clinic. These are popping up all over the country in pharmacies, drugstores, and shopping centers. They're usually staffed by nurse practitioners or physician assistants that have been trained to treat common illnesses and complaints. They're usually fairly quick and inexpensive. However, if you have serious medical issues or chronic medical problems, these are probably not your best option.  No Primary Care Doctor: - Call Health Connect at  [phone] - they can help you locate a primary care doctor that  accepts your insurance, provides certain services, etc. - Physician Referral Service- [phone]  Chronic Pain Problems: Organization         Address  Phone   Notes  [facility]  [phone] Patients need to be referred by their primary care doctor.   Medication Assistance: Organization         Address  Phone   Notes  [facility] Medication [facility] [address] [place], [place] [address] [phone] --Must be a resident of [facility] -- Must have NO insurance coverage whatsoever (no Medicaid/ Medicare, etc.) -- The pt. MUST have a primary care doctor that directs their care regularly and follows them in the community   Med[facility]ist  [phone]   [employer]  [phone]    Agencies that provide inexpensive medical care: Organization          Address  Phone   Notes  [place]  [phone]   [facility]    [phone]   [facility] [place], [place] [address] [phone]   [place] [address], [place] [phone]   Planned Parenthood    [phone]   [facility]    [phone]   [place] and [place] [address], [place] Phone:  [phone], Fax:  [phone] Hours of Operation:  9 am - 6 pm, M-F.  Also accepts Medicaid/Medicare and self-pay.  [facility] for [place], [address], [place] Phone: [phone], Fax: [phone]. Hours of Operation:  8:30 am - 5:30 pm, M-F.  Also accepts Medicaid and self-pay.  [facility] [place] [address] [place] Phone: [phone]   [place], [place], [place] [phone], Ext. [phone] Mondays & Thursdays: 7-9 AM.  First 15 patients are seen  on a first come, first serve basis.    [place] Providers:  Organization         Address  Phone   Notes  [facility] [address], [place] [phone] Also accepts self-pay patients.  [facility] [address], [place]  [phone]   [place], [address], [place] [phone]   [facility] [address], [place] [phone]   Lucianne Lei [address], [place]   [phone] Only accepts [place] Access [place] patients after they have their name applied to their card.   Self-Pay (no insurance) in [facility]:  Organization         Address  Phone   Notes  Sickle Cell Patients, [facility] Internal Medicine [place] [phone]   [facility] Urgent Care [place] [phone]     [facility] [place]  [place], [place], [place] [phone]   [facility]/Dr. Osei-Bonsu  [address], [place] or [address], [place] [phone] Phone number for both [place] and [place] locations is the same.  Urgent Medical and [facility] [address], [place] [phone]   [facility] [address], [place] or [address] [phone]   [facility] [address], [place] [phone], phone; [phone], fax Sees patients 1st and 3rd Saturday of every month.  Must not qualify for public or private insurance (i.e. Medicaid, Medicare, [place] Health Choice, Veterans' Benefits)  Household income should be no more than 200% of the poverty level The clinic cannot treat you if you are pregnant or think you are pregnant  Sexually transmitted diseases are not treated at the clinic.    Dental Care: Organization         Address  Phone  Notes  [facility] [place] [phone] Accepts children up to age 58 who are enrolled in [place] or [place]; pregnant women with a Medicaid card; and children who have applied for Medicaid or [place] Health Choice, but were declined, whose parents can pay a reduced fee at time of service.  [facility] Department of [facility]  [address], [place] [phone] Accepts children up to age 55 who are enrolled in [place] or [place]; pregnant women with a Medicaid card; and children who have applied for Medicaid or [place] Health Choice, but were declined, whose parents can pay a reduced fee at time of service.  [place] Adult Dental Access PROGRAM  [place] [phone] Patients are seen by appointment only. Walk-ins are not accepted. [place] will see patients 11  years of age and older. Monday - Tuesday (8am-5pm) Most Wednesdays (8:30-5pm) $30 per visit, cash only  [facility] Adult Dental Access PROGRAM  [address], [facility] [phone] Patients are seen by appointment only. Walk-ins are not accepted. [place] will see patients 52 years of age and older. One Wednesday Evening (Monthly: Volunteer Based).  $30 per visit, cash only  [place]  [phone] for adults; Children under age 60, call Graduate Pediatric Dentistry at [phone])  [phone]. Children aged 34-14, please call [phone] to request a pediatric application.  Dental services are provided in all areas of dental care including fillings, crowns and bridges, complete and partial dentures, implants, gum treatment, root canals, and extractions. Preventive care is also provided. Treatment is provided to both adults and children. Patients are selected via a lottery and there is often a waiting list.   [facility] [address] [place]  [phone] [email or web address]   [facility] [address] [place], [place] [phone], Ext. [phone] Second and Fourth Thursday of each month, opens at 6:30 AM; Clinic ends at 9 AM.  Patients are seen on a first-come first-served basis, and a limited number are seen during each clinic.   [facility]  [address] Ether Griffins [place], [place] [phone]   Eligibility Requirements You must have lived in [place], [place], or [place] counties for at least the last three months.   You cannot be eligible for state or federal sponsored [place], including [employer], [place], or [employer].   You generally cannot be eligible for healthcare insurance through your employer.    How to apply: Eligibility screenings are held every Tuesday and Wednesday afternoon from 1:00 pm until 4:00 pm. You do not need an appointment for the interview!  [facility]  [address], [place], [place] [phone]   [facility] Health Department  [phone]   [facility] Health Department  [phone]   [facility] Health Department  [phone]    Behavioral Health Resources in the Community: Intensive Outpatient Programs Organization         Address  Phone  Notes  [facility] [address] [place], [place] [phone]   [facility] Outpatient [address], [place], [place] [phone]   ADS: Alcohol & Drug Svcs [address] [place], [place]  [phone]   [facility] Mental Health [address]  [place], [place] [phone] or [phone]   Substance Abuse Resources Organization         Address  Phone  Notes  Alcohol and Drug Services  [phone]   [facility]  [phone]   [facility]  [phone]   Floydene Flock  [phone]   Residential & Outpatient Substance Abuse Program  [phone]   Psychological Services Organization         Address  Phone  Notes  [facility] Behavioral Health  [phone]   [facility] Services  [phone]   [facility] Mental Health [address] [place] [phone] or [phone]    Mobile Crisis Teams Organization         Address  Phone  Notes  [facility], Mobile Crisis Care Unit  [phone]   [facility]  [address] [place], [place] [phone]   Doristine Locks [address] [place] [phone]    Self-Help/Support Groups Organization         Address  Phone             Notes  Mental Health Assoc. of [place] - variety of support groups  336- I7437963 Call for more information  Narcotics Anonymous (NA), [facility] [address], [employer] [place]  2 meetings at this location   Chief Executive Officer  Notes  [facility] [address],    [place]  [place]  [address] [place] [id number], [place], [place]   [place], [place] [phone] Admissions: 8am-3pm M-F  Incentives Substance [place] [address],    [place], [place] [phone]   [facility] [address] [place], [place], [place]   [facility] [address]  [place], [place]   Insight Programs - Intensive Outpatient [address], [place], [place]   [facility] [place].,  [place], [place] [phone] or [phone]   [facility] Services (RTS) [address], [place], [place] Accepts Medicaid  [facility] [place] [address],  [place] [phone] Substance Abuse/Addiction Treatment   [facility] Organization         Address  Phone  Notes  [facility]  [phone]   Domenic Schwab, PhD [address] Arlis Porta [place], [place]   [phone] or [phone]   [place], [place] [phone]   [facility] [address] [place], [place] [phone] Insurance/Medicaid/sponsorship through [facility] and Families [address]                                    [place], [place] [phone] [place] [address], [place] [phone]    Dr. Adele Schilder  [phone]   [facility] Dept. 1) [address] [place] 2) [place] 3)  [place] [address], [place] [phone]  [phone]   [place] [phone] or [phone] (After Hours)

## 2013-11-28 NOTE — Progress Notes (Signed)
Case Manager spoke with patient at bedside.Patient reports he left [facility] and was discharged home on 11/25/13.Patient reports his hospital bed and hoyer lift have not yet arrived. Patient called [facility] and has been given a tentative delivery date of 1.5.2015.Patient reports he has had some home care services with [place].This CM will call and ask  If they can provide an assistance before next scheduled visit on 1.6.2015. CM asked if his daughter / family can provide any assistance patient reports his family had returned home and are out of  State,and his daughter is working all weekend.CM will continue efforts to secure a safe discharge home.

## 2013-11-28 NOTE — Progress Notes (Signed)
Case manager spoke with patient / girlfriend re Discharge plan.Patient and girlfriend report they will follow up with [facility] from home.[name] reports she will call patients PCP on  Monday for follow up needs and she will call [facility] regarding increased services.[name] aware patient will receive a Home health visit on 1.5.2015. MD at bedside and  Patients concerns addressed.No further CM needs at this time.Teach back method used to ensure patient understands current discharge plan.

## 2013-11-28 NOTE — ED Notes (Signed)
Pt given cup of water 

## 2013-12-01 ENCOUNTER — Telehealth: Payer: Self-pay

## 2013-12-01 ENCOUNTER — Encounter (HOSPITAL_COMMUNITY): Payer: Self-pay | Admitting: Emergency Medicine

## 2013-12-01 ENCOUNTER — Emergency Department (HOSPITAL_COMMUNITY)
Admission: EM | Admit: 2013-12-01 | Discharge: 2013-12-02 | Disposition: A | Discharge status: Home / Self Care | Payer: Medicare PPO | Source: Home / Self Care | Attending: Emergency Medicine | Admitting: Emergency Medicine

## 2013-12-01 ENCOUNTER — Other Ambulatory Visit: Payer: Medicare PPO

## 2013-12-01 DIAGNOSIS — I10 Essential (primary) hypertension: Secondary | ICD-10-CM | POA: Insufficient documentation

## 2013-12-01 DIAGNOSIS — R32 Unspecified urinary incontinence: Secondary | ICD-10-CM | POA: Insufficient documentation

## 2013-12-01 DIAGNOSIS — Z79899 Other long term (current) drug therapy: Secondary | ICD-10-CM | POA: Insufficient documentation

## 2013-12-01 DIAGNOSIS — G8310 Monoplegia of lower limb affecting unspecified side: Secondary | ICD-10-CM | POA: Insufficient documentation

## 2013-12-01 DIAGNOSIS — Z86711 Personal history of pulmonary embolism: Secondary | ICD-10-CM | POA: Insufficient documentation

## 2013-12-01 DIAGNOSIS — R159 Full incontinence of feces: Secondary | ICD-10-CM

## 2013-12-01 DIAGNOSIS — M129 Arthropathy, unspecified: Secondary | ICD-10-CM | POA: Insufficient documentation

## 2013-12-01 DIAGNOSIS — Z7901 Long term (current) use of anticoagulants: Secondary | ICD-10-CM

## 2013-12-01 DIAGNOSIS — Z86718 Personal history of other venous thrombosis and embolism: Secondary | ICD-10-CM | POA: Insufficient documentation

## 2013-12-01 DIAGNOSIS — G839 Paralytic syndrome, unspecified: Secondary | ICD-10-CM

## 2013-12-01 DIAGNOSIS — Z87891 Personal history of nicotine dependence: Secondary | ICD-10-CM

## 2013-12-01 DIAGNOSIS — Z862 Personal history of diseases of the blood and blood-forming organs and certain disorders involving the immune mechanism: Secondary | ICD-10-CM | POA: Insufficient documentation

## 2013-12-01 NOTE — Telephone Encounter (Signed)
Telephone call from [place], patient's caregiver, stating he is getting much worse.  He is having spasms, has crying spells and is unable to transfer to hover round or get out of bed.  States they do have in-home assistance and that the aide is doing a good job, however, [name] is unable to continue to stay at patient's residence and has to return home.  She needs to be advised what can be done to assist the patient.  She feels they do not know what else to do.

## 2013-12-01 NOTE — Telephone Encounter (Signed)
RN from [facility] called to ask about referral to [facility] for pt. I advised him that referral has been sent and we are waiting for CB from Dr Hirsch's office w/appt. Provided Dr Renold Genta # for pt to call to schedule appt if possible.

## 2013-12-01 NOTE — ED Notes (Addendum)
Pt c/o weakness and numbness in BLE. Pt has been seen multiple times at [facility] for same. Pt states that his physician, Ellsworth Lennox, called him and told him to come to ED for a CT Scan. Previous MRI scans were unsuccessful d/t pt size. Pt completed treatment at [facility] for the same.

## 2013-12-01 NOTE — ED Notes (Signed)
Bed: RESB Expected date:  Expected time:  Means of arrival:  Comments: EMS 

## 2013-12-01 NOTE — Telephone Encounter (Signed)
I left a message advising pt that I want him to go to ED for admission. This is the best way to get the most appropriate imaging study as advised by Dr. Zigmund Daniel (radiology). He recommends CT myelogram which is typically ordered by the specialist. Given pt's symptoms (inability to bear weight or ambulate as well as bowel incontinence), I advised pt to go to ED for admission with subsequent evaluation by the specialist. Previous Lumbar MR was done @ [facility] but now that pt cannot bear weight, he cannot be imaged at [facility] (though they have an open MR unit). His mobility device is not allowed in the MR room. The new CT imaging unit @ [facility] has new weight limit= 500 lbs but pt may not fit in the opening (weigh > 300 lbs but not sure of waist circumference). I discussed all this w/ Dr. Zigmund Daniel who recommended CT myelogram.  I will attempt to contact pt again tonight @ 6:30 PM.

## 2013-12-01 NOTE — Telephone Encounter (Signed)
Phoned pt's home again; left message advising ED evaluation; goal is to have pt seen and admitted by a specialist, given his symptoms of increasing pain, inability to bear weight and loss of control of bowels.  The radiologist that I consulted earlier today recommended CT myelogram of T-spine; I would defer to specialist about this and think that pt needs in-patient evaluation. Attempts to get evaluation done as outpatient have been unsuccessful. (Earlier today in conversation w/ pt: according to pt, Dr. Luiz Ochoa will no longer be with the neurosurgical practice; this is the surgeon who performed previous surgery on pt. Referral to spine specialist, therefore, is also pendng).

## 2013-12-01 NOTE — Telephone Encounter (Addendum)
I spoke with pt and called a radiologist (Dr. Zigmund Daniel) to find out what would be best way to image T-spine. Weight limit for new scanner at [facility] is now 500 lbs. Pt states he cannot go to [facility] now because he cannot bear weight and get up on table; also his HoverRound is not allowed in the exam room. I think pt needs admission for evaluation by neurosurgoen as well as getting imaging done (Dr. Zigmund Daniel recommended CT myelogram which is usually ordered by the specialist).  Initially I spoke with [name] (pt's wife from whom he is separated); pt spoke with me and indicated that his brother would be at his home for another 2 days to assist with care.

## 2013-12-02 ENCOUNTER — Telehealth: Payer: Self-pay | Admitting: Family Medicine

## 2013-12-02 ENCOUNTER — Telehealth: Payer: Self-pay

## 2013-12-02 ENCOUNTER — Inpatient Hospital Stay (HOSPITAL_COMMUNITY)
Admission: EM | Admit: 2013-12-02 | Discharge: 2013-12-09 | DRG: 552 | Disposition: A | Discharge status: Rehabilitation Facility | Payer: Medicare PPO | Attending: Internal Medicine | Admitting: Internal Medicine

## 2013-12-02 ENCOUNTER — Encounter (HOSPITAL_COMMUNITY): Payer: Self-pay | Admitting: Emergency Medicine

## 2013-12-02 DIAGNOSIS — Z833 Family history of diabetes mellitus: Secondary | ICD-10-CM

## 2013-12-02 DIAGNOSIS — I824Y9 Acute embolism and thrombosis of unspecified deep veins of unspecified proximal lower extremity: Secondary | ICD-10-CM

## 2013-12-02 DIAGNOSIS — Z7901 Long term (current) use of anticoagulants: Secondary | ICD-10-CM

## 2013-12-02 DIAGNOSIS — M4804 Spinal stenosis, thoracic region: Secondary | ICD-10-CM | POA: Diagnosis present

## 2013-12-02 DIAGNOSIS — D689 Coagulation defect, unspecified: Secondary | ICD-10-CM | POA: Diagnosis present

## 2013-12-02 DIAGNOSIS — M4714 Other spondylosis with myelopathy, thoracic region: Principal | ICD-10-CM | POA: Diagnosis present

## 2013-12-02 DIAGNOSIS — I10 Essential (primary) hypertension: Secondary | ICD-10-CM | POA: Diagnosis present

## 2013-12-02 DIAGNOSIS — G8220 Paraplegia, unspecified: Secondary | ICD-10-CM

## 2013-12-02 DIAGNOSIS — R159 Full incontinence of feces: Secondary | ICD-10-CM | POA: Diagnosis present

## 2013-12-02 DIAGNOSIS — I824Y3 Acute embolism and thrombosis of unspecified deep veins of proximal lower extremity, bilateral: Secondary | ICD-10-CM

## 2013-12-02 DIAGNOSIS — R600 Localized edema: Secondary | ICD-10-CM

## 2013-12-02 DIAGNOSIS — Z8249 Family history of ischemic heart disease and other diseases of the circulatory system: Secondary | ICD-10-CM

## 2013-12-02 DIAGNOSIS — M4800 Spinal stenosis, site unspecified: Secondary | ICD-10-CM | POA: Diagnosis present

## 2013-12-02 DIAGNOSIS — Z6841 Body Mass Index (BMI) 40.0 and over, adult: Secondary | ICD-10-CM

## 2013-12-02 DIAGNOSIS — Z23 Encounter for immunization: Secondary | ICD-10-CM

## 2013-12-02 DIAGNOSIS — Z86711 Personal history of pulmonary embolism: Secondary | ICD-10-CM

## 2013-12-02 DIAGNOSIS — Z87891 Personal history of nicotine dependence: Secondary | ICD-10-CM

## 2013-12-02 DIAGNOSIS — N39498 Other specified urinary incontinence: Secondary | ICD-10-CM | POA: Diagnosis present

## 2013-12-02 DIAGNOSIS — Z888 Allergy status to other drugs, medicaments and biological substances status: Secondary | ICD-10-CM

## 2013-12-02 DIAGNOSIS — M48061 Spinal stenosis, lumbar region without neurogenic claudication: Secondary | ICD-10-CM | POA: Diagnosis present

## 2013-12-02 DIAGNOSIS — Z86718 Personal history of other venous thrombosis and embolism: Secondary | ICD-10-CM

## 2013-12-02 DIAGNOSIS — E119 Type 2 diabetes mellitus without complications: Secondary | ICD-10-CM

## 2013-12-02 HISTORY — DX: Depression, unspecified: F32.A

## 2013-12-02 HISTORY — DX: Sleep apnea, unspecified: G47.30

## 2013-12-02 HISTORY — DX: Major depressive disorder, single episode, unspecified: F32.9

## 2013-12-02 HISTORY — DX: Type 2 diabetes mellitus without complications: E11.9

## 2013-12-02 LAB — CBC WITH DIFFERENTIAL/PLATELET
Basophils Absolute: 0 10*3/uL (ref 0.0–0.1)
Basophils Relative: 0 % (ref 0–1)
Eosinophils Absolute: 0.3 10*3/uL (ref 0.0–0.7)
Eosinophils Relative: 4 % (ref 0–5)
HCT: 44.2 % (ref 39.0–52.0)
Hemoglobin: 14.4 g/dL (ref 13.0–17.0)
Lymphocytes Relative: 20 % (ref 12–46)
Lymphs Abs: 1.6 10*3/uL (ref 0.7–4.0)
MCH: 29.1 pg (ref 26.0–34.0)
MCHC: 32.6 g/dL (ref 30.0–36.0)
MCV: 89.3 fL (ref 78.0–100.0)
Monocytes Absolute: 0.6 10*3/uL (ref 0.1–1.0)
Monocytes Relative: 7 % (ref 3–12)
Neutro Abs: 5.4 10*3/uL (ref 1.7–7.7)
Neutrophils Relative %: 69 % (ref 43–77)
Platelets: 254 10*3/uL (ref 150–400)
RBC: 4.95 MIL/uL (ref 4.22–5.81)
RDW: 16.2 % — ABNORMAL HIGH (ref 11.5–15.5)
WBC: 7.9 10*3/uL (ref 4.0–10.5)

## 2013-12-02 LAB — BASIC METABOLIC PANEL
BUN: 14 mg/dL (ref 6–23)
CO2: 28 mEq/L (ref 19–32)
Calcium: 9.7 mg/dL (ref 8.4–10.5)
Chloride: 102 mEq/L (ref 96–112)
Creatinine, Ser: 0.75 mg/dL (ref 0.50–1.35)
GFR calc Af Amer: >90 mL/min (ref >90)
GFR calc non Af Amer: >90 mL/min (ref >90)
Glucose, Bld: 97 mg/dL (ref 70–99)
Potassium: 4 mEq/L (ref 3.7–5.3)
Sodium: 142 mEq/L (ref 137–147)

## 2013-12-02 LAB — GLUCOSE, CAPILLARY: Glucose-Capillary: 128 mg/dL — ABNORMAL HIGH (ref 70–99)

## 2013-12-02 LAB — PROTIME-INR
INR: 1.02 (ref 0.00–1.49)
Prothrombin Time: 13.2 seconds (ref 11.6–15.2)

## 2013-12-02 MED ORDER — ACETAMINOPHEN 325 MG PO TABS
650.0000 mg | ORAL_TABLET | Freq: Four times a day (QID) | ORAL | Status: DC | PRN
Start: 2013-12-02 — End: 2013-12-09

## 2013-12-02 MED ORDER — METOPROLOL SUCCINATE ER 50 MG PO TB24
50.0000 mg | ORAL_TABLET | Freq: Every day | ORAL | Status: DC
  Administered 2013-12-02 – 2013-12-09 (×8): 50 mg via ORAL
  Filled 2013-12-02 (×8): qty 1

## 2013-12-02 MED ORDER — INFLUENZA VAC SPLIT QUAD 0.5 ML IM SUSP
0.5000 mL | INTRAMUSCULAR | Status: AC
  Administered 2013-12-03: 0.5 mL via INTRAMUSCULAR
  Filled 2013-12-02: qty 0.5

## 2013-12-02 MED ORDER — ACETAMINOPHEN 650 MG RE SUPP: 650.0000 mg | Freq: Four times a day (QID) | RECTAL | Status: DC | PRN

## 2013-12-02 MED ORDER — AMMONIUM LACTATE 12 % EX LOTN
TOPICAL_LOTION | CUTANEOUS | Status: DC | PRN
  Filled 2013-12-02: qty 400

## 2013-12-02 MED ORDER — TRAZODONE HCL 100 MG PO TABS
100.0000 mg | ORAL_TABLET | Freq: Every day | ORAL | Status: DC
  Administered 2013-12-02 – 2013-12-08 (×7): 100 mg via ORAL
  Filled 2013-12-02 (×8): qty 1

## 2013-12-02 MED ORDER — LORAZEPAM 2 MG/ML IJ SOLN: 0.5000 mg | Freq: Four times a day (QID) | INTRAMUSCULAR | Status: DC | PRN

## 2013-12-02 MED ORDER — SERTRALINE HCL 50 MG PO TABS
75.0000 mg | ORAL_TABLET | Freq: Every day | ORAL | Status: DC
  Administered 2013-12-02 – 2013-12-09 (×8): 75 mg via ORAL
  Filled 2013-12-02 (×8): qty 1

## 2013-12-02 MED ORDER — HYDROMORPHONE HCL PF 1 MG/ML IJ SOLN
1.0000 mg | INTRAMUSCULAR | Status: DC | PRN
  Administered 2013-12-04 – 2013-12-09 (×17): 1 mg via INTRAVENOUS
  Filled 2013-12-02 (×17): qty 1

## 2013-12-02 MED ORDER — AMMONIUM LACTATE 12 % EX LOTN
1.0000 "application " | TOPICAL_LOTION | Freq: Two times a day (BID) | CUTANEOUS | Status: DC
  Administered 2013-12-03 – 2013-12-09 (×10): 1 via TOPICAL
  Filled 2013-12-02 (×3): qty 400

## 2013-12-02 MED ORDER — OXYCODONE HCL 5 MG PO TABS
5.0000 mg | ORAL_TABLET | ORAL | Status: DC | PRN
  Administered 2013-12-02 – 2013-12-09 (×5): 5 mg via ORAL
  Filled 2013-12-02 (×5): qty 1

## 2013-12-02 MED ORDER — DOCUSATE SODIUM 100 MG PO CAPS
100.0000 mg | ORAL_CAPSULE | Freq: Two times a day (BID) | ORAL | Status: DC
Start: 2013-12-02 — End: 2013-12-09
  Administered 2013-12-02 – 2013-12-09 (×14): 100 mg via ORAL
  Filled 2013-12-02 (×16): qty 1

## 2013-12-02 NOTE — Consult Note (Signed)
Reason for Consult: T8 paraplegia Referring Physician: Emergency department Dr. Tomasa Hose is an 48 y.o. male.  HPI: Patient was a shows evidence of pelvic in the past medical history apparently has had multiple back operations performed by Dr. Luiz Ochoa as well as Dr. Elisabeth Cara and another private practice spine surgeon in [place]. 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 Luiz Ochoa back in 2005 or 6 to his recollection he did recover to a point from Nectar at that was not to the extent that he is suffering now. Back in 4 to the emergency department with scheduled imaging however due to his morbid obesity been unable to get him into an MRI scanner they were discussed CT myelogram however his history of PEs and DVTs have left him on Coumadin or warfarin and this has had to be stopped prior to him receiving the myelogram he was in was the long last night it was scheduled for an outpatient imaging study however they should present back to the emergency problem today recommendation from his primary care doctor 9 consultation with myself for evaluation. The patient is also had inability to control his bowel and bladder for the last 3 months since October is well and he wears diapers to help control this. He underwent a CT scan that shows multilevel spondylosis and stenosis the worst area looks to be around T7-8 and that'll correlate with his clinical picture of surgery severe cord compression thoracic stenosis at this level.  Past Medical History  Diagnosis Date  . Arthritis   . Clotting disorder   . Hypertension   .  Pulmonary embolism     x 3 in 2005, 2008, 2010  . DVT (deep venous thrombosis)     Summer 2012  . Spinal stenosis     Past Surgical History  Procedure Laterality Date  . Spine surgery  11/30/11    Total 6 back surgeries    Family History  Problem Relation Age of Onset  . Cancer Mother 16    Lung  . Hypertension Father   . Diabetes Father     Social History:  reports that he quit smoking about 12 years ago. His smoking use included Cigarettes. He has a 15 pack-year smoking history. He has never used smokeless tobacco. He reports that he drinks about 0.5 ounces of alcohol per week. He reports that he does not use illicit drugs.  Allergies:  Allergies  Allergen Reactions  . Lisinopril Other (See Comments)    cough    Medications: I have reviewed the patient's current medications.  Results for orders placed during the hospital encounter of 12/02/13 (from the past 48 hour(s))  CBC WITH DIFFERENTIAL     Status: Abnormal   Collection Time    12/02/13  5:02 PM      Result Value Range   WBC 7.9  4.0 - 10.5 K/uL   RBC 4.95  4.22 - 5.81 MIL/uL   Hemoglobin 14.4  13.0 - 17.0 g/dL   HCT 44.2  39.0 - 52.0 %   MCV 89.3  78.0 - 100.0 fL  MCH 29.1  26.0 - 34.0 pg   MCHC 32.6  30.0 - 36.0 g/dL   RDW 16.2 (*) 11.5 - 15.5 %   Platelets 254  150 - 400 K/uL   Neutrophils Relative % 69  43 - 77 %   Neutro Abs 5.4  1.7 - 7.7 K/uL   Lymphocytes Relative 20  12 - 46 %   Lymphs Abs 1.6  0.7 - 4.0 K/uL   Monocytes Relative 7  3 - 12 %   Monocytes Absolute 0.6  0.1 - 1.0 K/uL   Eosinophils Relative 4  0 - 5 %   Eosinophils Absolute 0.3  0.0 - 0.7 K/uL   Basophils Relative 0  0 - 1 %   Basophils Absolute 0.0  0.0 - 0.1 K/uL  BASIC METABOLIC PANEL     Status: None   Collection Time    12/02/13  5:02 PM      Result Value Range   Sodium 142  137 - 147 mEq/L   Potassium 4.0  3.7 - 5.3 mEq/L   Chloride 102  96 - 112 mEq/L   CO2 28  19 - 32 mEq/L   Glucose, Bld 97  70 - 99 mg/dL   BUN  14  6 - 23 mg/dL   Creatinine, Ser 0.75  0.50 - 1.35 mg/dL   Calcium 9.7  8.4 - 10.5 mg/dL   GFR calc non Af Amer >90  >90 mL/min   GFR calc Af Amer >90  >90 mL/min   Comment: (NOTE)     The eGFR has been calculated using the CKD EPI equation.     This calculation has not been validated in all clinical situations.     eGFR's persistently <90 mL/min signify possible Chronic Kidney     Disease.  PROTIME-INR     Status: None   Collection Time    12/02/13  5:02 PM      Result Value Range   Prothrombin Time 13.2  11.6 - 15.2 seconds   INR 1.02  0.00 - 1.49    No results found.  Review of Systems  Constitutional: Negative.   HENT: Negative.   Eyes: Negative.   Respiratory: Negative.   Cardiovascular: Negative.   Gastrointestinal: Positive for diarrhea.  Genitourinary: Positive for frequency.  Musculoskeletal: Positive for back pain, falls and joint pain.  Skin: Negative.   Neurological: Positive for sensory change and focal weakness.  Endo/Heme/Allergies: Negative.   Psychiatric/Behavioral: Negative.    Blood pressure 146/79, pulse 87, SpO2 100.00%. Physical Exam  Constitutional: He is oriented to person, place, and time. He appears well-developed and well-nourished.  HENT:  Head: Normocephalic.  Eyes: Pupils are equal, round, and reactive to light.  Neck: Normal range of motion.  Cardiovascular: Normal rate and regular rhythm.   Respiratory: Effort normal and breath sounds normal.  GI: Soft.  Neurological: He is alert and oriented to person, place, and time. He displays abnormal reflex. GCS eye subscore is 4. GCS verbal subscore is 5. GCS motor subscore is 6. He displays Babinski's sign on the right side. He displays Babinski's sign on the left side.  Reflex Scores:      Patellar reflexes are 3+ on the right side and 3+ on the left side.      Achilles reflexes are 3+ on the right side and 3+ on the left side. Upper extremity strength is 5 out of 5 lower extremity displays  complete paraplegia with at T7-T8 sensory level patient is  a sensate below T7-8 with no movement of his lower extremities. Reflexes are brisk with positive Babinski's and ankle clonus.  Skin: Skin is warm and dry.    Assessment/Plan: 61 or gentleman presents for evaluation of a T8 paraplegic state that now has been going on for approximately 3 months. Patient reports having seen no volitional movement having no feeling in no movement of his legs ever since October of 2014. They've been attempting to imaging study him as an outpatient with unsuccessful secondary to his size and his medical comorbidities. Patient has had multiple and long history with his back multiple different operations and other rehabilitation facilities. His his current neurologic exam is consistent with a complete T8 paraplegia. And due to the duration of symptoms and the fact there is no movement and no sensation in his lower extremities there is no operation that has any significant opportunity to reverse this dense paraplegia even if we identify the source of the stenosis a decompressive laminectomy would not reverse his neurologic devastation. I introduced a Concept of this to the patient at this point we're waiting on the distal blood work to confirm his INR recommended we consult internal medicine and admit him to the medicine service for management of his multiple other comorbidities and obtain a MRI of his thoracic spine if we can fit him on the large unit tomorrow or if not proceed with a CT myelogram of his thoracic spine. However if the patient considers what I've described as being the virtual no chance of neurologic recovery from surgical decompression that the identification of that source of stenosis would be more academic he would not be in his best interest to act on a decompression of this level with no functional recovery. He and I carried out extensive conversation about this and we will discuss again the morning  recommend admission stabilization of his medical issues and will evaluate imaging study when we're able to obtain. Mame Twombly P 12/02/2013, 6:17 PM

## 2013-12-02 NOTE — ED Notes (Signed)
Pt arrives via EMS from home, pt has hx of back surgeries and chronic back pain. States for 2 months numbness and paralysis has started in his legs and up to his belly button. Pt has hx of spinal stenosis, alert and oriented, nad

## 2013-12-02 NOTE — Telephone Encounter (Signed)
He was told to follow up with Dr Luiz Ochoa, have called to check on the status of the appt with him. Patient too large for MRI, on blood thinners which contraindicates a myelogram. Appointment not scheduled yet, new patient coordinator Freda Munro )has advised me Dr Consuello Masse is leaving the practice. He will be in [place]. She has pulled his old chart and will have another doctor review the information and see if someone will agree on accepting the patient. I think it would be best in this situation if you call the office and speak directly to a physician regarding this patient. There is a new doctor there Dr Kathyrn Sheriff who is accepting new patients, and worked a patient in for [place] today, so I know he is in the office today. The phone number there is [phone] option 1 for physician to speak to a physician

## 2013-12-02 NOTE — Discharge Instructions (Signed)
You were seen today for bilateral lower extremity weakness. The symptoms have been ongoing for you.  We are unable to get a CT myelogram tonight given Coumadin use and chronicity of symptoms. I have spoken with her primary care physician who will help to facilitate outpatient MRI versus CT myelogram. You should return if he had new or worsening symptoms.

## 2013-12-02 NOTE — ED Provider Notes (Signed)
CSN: [id number]     Arrival date & time 12/01/13  2227 History   First MD Initiated Contact with Patient 12/01/13 2333     Chief Complaint  Patient presents with  . Extremity Weakness    BLE   (Consider location/radiation/quality/duration/timing/severity/associated sxs/prior Treatment) HPI  This 48 year old with a history of hypertension, PE and recent history of bilateral lower extremity weakness, urinary difficulty, and bowel incontinence who presents for CT scanning. Patient states that he spoke with his doctor earlier this afternoon and she is referred him to the ER to get a CT myelogram. Have reviewed the patient's chart and he has had multiple visits over the last month for progressive bilateral lower extremity weakness and bowel and bladder issues. Symptom onset was September. He is unable to get an MRI secondary to body habitus. Patient was last seen on January 3rd.  He does have home health care. Patient states that there have been no changes in his symptoms since he was last seen but that his primary physicians at the "I needed to come now to the hospital and get a CT myelogram and a neurosurgeon."  He denies any fevers, chest pain or shortness of breath, abdominal pain.  I reviewed the patient's chart. He has had several visits for progressive neurologic lower extremity complaints beginning with numbness. He did have an MRI of his lumbar spine in October and has spinal stenosis.  The patient confirms that when he was seen here on December 16, he could move his bilateral lower extremities "some" but he was unable to ambulate. He was evaluated by physical therapy at that time to recommend a SNF placement. He was discharged from rehabilitation on December 30.  He states that he was unable to ambulate in rehab and requires a motorized scooter.  Past Medical History  Diagnosis Date  . Arthritis   . Clotting disorder   . Hypertension   . Pulmonary embolism     x 3 in 2005, 2008, 2010  . DVT  (deep venous thrombosis)     Summer 2012  . Spinal stenosis    Past Surgical History  Procedure Laterality Date  . Spine surgery  11/30/11    Total 6 back surgeries   Family History  Problem Relation Age of Onset  . Cancer Mother 30    Lung  . Hypertension Father   . Diabetes Father    History  Substance Use Topics  . Smoking status: Former Smoker -- 1.50 packs/day for 10 years    Types: Cigarettes    Quit date: 04/23/2001  . Smokeless tobacco: Never Used  . Alcohol Use: 0.5 oz/week    1 drink(s) per week    Review of Systems  Respiratory: Negative.  Negative for chest tightness and shortness of breath.   Cardiovascular: Negative.  Negative for chest pain.  Gastrointestinal: Negative.  Negative for abdominal pain.  Genitourinary: Negative.  Negative for dysuria.       Urinary incontinence and bowel incontinence  Musculoskeletal: Negative for back pain.  Skin: Negative for rash.  Neurological: Positive for weakness and numbness. Negative for headaches.  All other systems reviewed and are negative.    Allergies  Lisinopril  Home Medications   Current Outpatient Rx  Name  Route  Sig  Dispense  Refill  . ammonium lactate (AMLACTIN) 12 % cream   Topical   Apply 1 g topically 2 (two) times daily.         . diazepam (VALIUM) 10  MG tablet      Take 1 tablet by mouth every 8 hours if needed for anxiety   60 tablet   0   . docusate sodium (COLACE) 100 MG capsule   Oral   Take 1 capsule (100 mg total) by mouth every 12 (twelve) hours.   60 capsule   0   . ketoconazole (NIZORAL) 2 % shampoo   Topical   Apply 1 application topically daily.         . metFORMIN (GLUCOPHAGE) 500 MG tablet   Oral   Take 500 mg by mouth every evening.          . metoprolol succinate (TOPROL-XL) 50 MG 24 hr tablet   Oral   Take 1 tablet (50 mg total) by mouth daily.   30 tablet   5   . oxyCODONE-acetaminophen (PERCOCET/ROXICET) 5-325 MG per tablet      Take 1-2 tablets  as needed every 6 hours for severe pain.   60 tablet   0   . potassium chloride SA (KLOR-CON M20) 20 MEQ tablet   Oral   Take 1 tablet (20 mEq total) by mouth 2 (two) times daily.   60 tablet   2   . sertraline (ZOLOFT) 50 MG tablet   Oral   Take 75 mg by mouth daily.         Marland Kitchen torsemide (DEMADEX) 20 MG tablet   Oral   Take 20 mg by mouth daily.          . traZODone (DESYREL) 100 MG tablet   Oral   Take 100 mg by mouth at bedtime.         Marland Kitchen warfarin (COUMADIN) 10 MG tablet   Oral   Take 5-10 mg by mouth daily. Takes 10mg  (1 tablet)  on Tuesday, Wednesday, Thursday, Friday,  & Sunday  Takes 5mg  (0.5 tablet) on Saturday and Monday          BP 131/71  Pulse 97  Temp(Src) 97.8 F (36.6 C) (Oral)  Resp 18  Ht 5\' 9"  (1.753 m)  Wt 375 lb (170.099 kg)  BMI 55.35 kg/m2  SpO2 97% Physical Exam  Nursing note and vitals reviewed. Constitutional: He is oriented to person, place, and time. He appears well-developed and well-nourished.  Morbidly obese  HENT:  Head: Normocephalic and atraumatic.  Eyes: Pupils are equal, round, and reactive to light.  Neck: Neck supple.  Cardiovascular: Normal rate, regular rhythm and normal heart sounds.   No murmur heard. Pulmonary/Chest: Effort normal and breath sounds normal. No respiratory distress. He has no wheezes.  Abdominal: Soft. Bowel sounds are normal. There is no tenderness. There is no rebound.  Genitourinary:  Wearing diaper  Musculoskeletal: He exhibits no edema.  Lymphadenopathy:    He has no cervical adenopathy.  Neurological: He is alert and oriented to person, place, and time.  2+ bilateral lower extremity patellar reflexes, cranial nerves intact, patient unable to move bilateral lower extremities  Skin: Skin is warm and dry.  Psychiatric: He has a normal mood and affect.    ED Course  Procedures (including critical care time) Labs Review Labs Reviewed - No data to display Imaging Review No results  found.  EKG Interpretation   None       MDM   1. Paralysis    Patient presents with progressive bilateral lower extremity numbness and paralysis. He has been seen multiple times and symptoms started back in October. He reports his symptoms are  unchanged over the last one to 2 weeks. He only presents because his primary physician referred him for a CT myelogram. On my neurologic exam, patient does not move bilateral lower extremities; however, I did note the patient was wiggling his left toe. When prompted, patient stated that he could not wiggle it.  I spoke with the patient's primary physician. She has not been evaluated the patient in several weeks it was concerned as the patient told her that his weakness was new. Attempts to obtain outpatient imaging has failed because the patient cannot help himself on the MRI table. I discussed with the primary physician that at this time of night and given that the patient is on Coumadin, a CT myelogram is not feasible. I also discussed options with the on-call radiologist who also stated that given the chronicity of symptoms and the invasiveness of the procedure, he felt MRI was more appropriate and arrangements should be made to help get the patient on the table.  I discussed this with both the patient and his primary physician. The patient states frustration with a progressive numbness of his symptoms.  The patient does need further imaging; however, given that he has had no change in symptoms of the last one to 2 weeks, I do not think he needs emergent imaging or admission. He does have home health. Patient was referred back to his primary physician who I have updated and will arrange for outpatient imaging for the patient.  After history, exam, and medical workup I feel the patient has been appropriately medically screened and is safe for discharge home. Pertinent diagnoses were discussed with the patient. Patient was given return  precautions.    Merryl Hacker, MD 12/02/13 0630

## 2013-12-02 NOTE — ED Provider Notes (Signed)
CSN: [id number]     Arrival date & time 12/02/13  1637 History   First MD Initiated Contact with Patient 12/02/13 1646     Chief Complaint  Patient presents with  . Numbness   (Consider location/radiation/quality/duration/timing/severity/associated sxs/prior Treatment) HPI Chronic severe back pain, chronic Coumadin for DVT and PE history, has IVC filter, stopped Coumadin itself after recent ED visit within the last 4 days, has approximately 4 month history gradual onset of bilateral legs paralysis started as gradual onset weakness both legs as well as numbness both legs now numbness is complete from the waistline down partial numbness at umbillicus and patient has no voluntary movement from the waist down he also has one to 2 months of bowel and bladder incontinence; the patient's last MRI was in September 2014 he now weighs too much for the MRI scanner; his most recent INR was almost 2 so he could not have a CT myelogram; his primary care doctors spoke with the neurosurgery clinic who recommends the patient return to the emergency department for neurosurgical consultation after ED evaluation to determine what if any imaging can and should be performed.   Past Medical History  Diagnosis Date  . Arthritis   . Clotting disorder   . Hypertension   . Pulmonary embolism     x 3 in 2005, 2008, 2010  . DVT (deep venous thrombosis)     Summer 2012  . Spinal stenosis   . Depression   . Sleep apnea   . Diabetes mellitus without complication    Past Surgical History  Procedure Laterality Date  . Spine surgery  11/30/11    Total 6 back surgeries   Family History  Problem Relation Age of Onset  . Cancer Mother 66    Lung  . Hypertension Father   . Diabetes Father    History  Substance Use Topics  . Smoking status: Former Smoker -- 1.50 packs/day for 10 years    Types: Cigarettes    Quit date: 04/23/2001  . Smokeless tobacco: Never Used  . Alcohol Use: 0.5 oz/week    1 drink(s) per week     Review of Systems 10 Systems reviewed and are negative for acute change except as noted in the HPI. Allergies  Lisinopril  Home Medications   No current outpatient prescriptions on file. BP 171/73  Pulse 84  Temp(Src) 97.9 F (36.6 C) (Oral)  Resp 20  Ht 5\' 10"  (1.778 m)  Wt 358 lb 9.6 oz (162.66 kg)  BMI 51.45 kg/m2  SpO2 96% Physical Exam  Nursing note and vitals reviewed. Constitutional:  Awake, alert, nontoxic appearance.  HENT:  Head: Atraumatic.  Eyes: Right eye exhibits no discharge. Left eye exhibits no discharge.  Neck: Neck supple.  Cardiovascular: Normal rate and regular rhythm.   No murmur heard. Pulmonary/Chest: Effort normal and breath sounds normal. No respiratory distress. He has no wheezes. He has no rales. He exhibits no tenderness.  Abdominal: Soft. Bowel sounds are normal. He exhibits no distension. There is no tenderness. There is no rebound and no guarding.  Musculoskeletal: He exhibits tenderness.  Patient numb from waist down paralyzed from waist down and has hyperreflexia bilateral knees with nonsustained clonus at the ankles and bilateral upgoing abnormal Babinski reflexes great toes with capillary refill less than 2 seconds toes both feet, DPs intact; mild lumbar tenderness  Neurological: He is alert.  Patient numb from waist down paralyzed from waist down and has hyperreflexia bilateral knees with nonsustained clonus at the  ankles and bilateral upgoing abnormal Babinski reflexes great toes; patient uses arms well has normal speech no facial asymmetry; partial decreased sensation starts at the umbilicus  Skin: No rash noted.  Psychiatric: He has a normal mood and affect.    ED Course  Procedures (including critical care time) Patient understands and agrees with initial ED impression and plan with expectations set for ED visit. NSurg with Pt in ED. 1730 [facility] to see in ED. 1830 Labs Review Labs Reviewed  CBC WITH DIFFERENTIAL - Abnormal;  Notable for the following:    RDW 16.2 (*)    All other components within normal limits  GLUCOSE, CAPILLARY - Abnormal; Notable for the following:    Glucose-Capillary 128 (*)    All other components within normal limits  BASIC METABOLIC PANEL  PROTIME-INR  COMPREHENSIVE METABOLIC PANEL  PROTIME-INR   Imaging Review No results found.  EKG Interpretation    Date/Time:    Ventricular Rate:    PR Interval:    QRS Duration:   QT Interval:    QTC Calculation:   R Axis:     Text Interpretation:              MDM   1. Paraplegia   2. Acute venous embolism and thrombosis of deep vessels of proximal lower extremity, bilateral   3. HTN (hypertension)   4. Hx pulmonary embolism    The patient appears reasonably stabilized for admission considering the current resources, flow, and capabilities available in the ED at this time, and I doubt any other [facility] requiring further screening and/or treatment in the ED prior to admission.    Babette Relic, MD 12/03/13 [id number]

## 2013-12-02 NOTE — ED Notes (Signed)
[facility] called for transport.  

## 2013-12-02 NOTE — Telephone Encounter (Signed)
After multiple conversations w/ specialists today, pt will be advised to go to [facility] ED for work-up. Pt states he will be call EMS to transprot him within the next 30 minutes. I have spoken with the ED physician, explained that I need pt to have work-up initiated and neurosurgical consult requested. The ED physician states he will get the evaluation started and consult the neurosurgeon. (Dr. Saintclair Halsted is the neurosurgeon at [facility] who is on call for the practice).

## 2013-12-02 NOTE — Telephone Encounter (Signed)
Phone call to Neurosurgical practice (previously [facility]); Dr. Luiz Ochoa is leaving the practice. This pt would be considered a "new pt". I reviewed this pt's need with a staff member in that office; it was suggested that the pt be advised to return to the ED at [facility] and ask physician there to do a full work-up as well as request a neurosurgical consult. Lengthy discussion re: pt's visit to [facility] last evening. Staff member will contact the neurosurgeon on call for the practice and have a call to me directly. I will take this call on my cell phone.

## 2013-12-02 NOTE — H&P (Signed)
[facility]                                                                                    Patient Demographics  Nicolas [name], is a 48 y.o. male  CSN: [id number]  MRN: [id number]  DOB - 11/25/1966  Admit Date - 12/02/2013  Outpatient Primary MD for the patient is Ellsworth Lennox, MD   With History of -  Past Medical History  Diagnosis Date  . Arthritis   . Clotting disorder   . Hypertension   . Pulmonary embolism     x 3 in 2005, 2008, 2010  . DVT (deep venous thrombosis)     Summer 2012  . Spinal stenosis       Past Surgical History  Procedure Laterality Date  . Spine surgery  11/30/11    Total 6 back surgeries    in for   Chief Complaint  Patient presents with  . Numbness     HPI  Nicolas [name]  is a 48 y.o. male, with past medical history significant for spinal stenosis diagnosed in 2004 who fell from his old wound September 1 and started complaining of numbness in the lower extremities, his symptoms got worse and he became incontinent of bladder and bowel. Lately he discussed the case with his physician Ellsworth Lennox home advised him to get admitted to for a CT or an MRI of the spine, since he weighs around 400 pounds. The patient stopped his Coumadin 4 days ago and came over for further workup.  Review of Systems    In addition to the HPI above,  No Fever-chills, No Headache, No changes with Vision or hearing, No problems swallowing food or Liquids, No Chest pain, Cough or Shortness of Breath, No Abdominal pain, No Nausea or Vommitting, Bowel movements are regular, No Blood in stool or Urine, No dysuria, No new skin rashes or bruises, No new joints pains-aches,  No recent weight gain or loss, No polyuria, polydypsia or polyphagia, No significant Mental Stressors.  A full 10 point Review of Systems was done, except as stated above, all other Review of Systems were negative.   Social History History  Substance Use Topics  .  Smoking status: Former Smoker -- 1.50 packs/day for 10 years    Types: Cigarettes    Quit date: 04/23/2001  . Smokeless tobacco: Never Used  . Alcohol Use: 0.5 oz/week    1 drink(s) per week     Family History Family History  Problem Relation Age of Onset  . Cancer Mother 36    Lung  . Hypertension Father   . Diabetes Father      Prior to Admission medications   Medication Sig Start Date End Date Taking? Authorizing Provider  ammonium lactate (AMLACTIN) 12 % cream Apply 1 g topically 2 (two) times daily.   Yes Historical Provider, MD  diazepam (VALIUM) 10 MG tablet Take 1 tablet by mouth every 8 hours if needed for anxiety 08/21/13  Yes Barton Fanny, MD  docusate sodium (COLACE) 100 MG capsule Take 1 capsule (100 mg total) by mouth every 12 (twelve) hours. 11/28/13  Yes Virgel Manifold, MD  ketoconazole (  NIZORAL) 2 % shampoo Apply 1 application topically daily.   Yes Historical Provider, MD  metFORMIN (GLUCOPHAGE) 500 MG tablet Take 500 mg by mouth every evening.    Yes Historical Provider, MD  metoprolol succinate (TOPROL-XL) 50 MG 24 hr tablet Take 1 tablet (50 mg total) by mouth daily. 07/31/13  Yes Barton Fanny, MD  oxyCODONE-acetaminophen (PERCOCET/ROXICET) 5-325 MG per tablet Take 1-2 tablets as needed every 6 hours for severe pain. 11/02/13  Yes Barton Fanny, MD  potassium chloride SA (KLOR-CON M20) 20 MEQ tablet Take 1 tablet (20 mEq total) by mouth 2 (two) times daily. 09/26/12  Yes Barton Fanny, MD  sertraline (ZOLOFT) 50 MG tablet Take 75 mg by mouth daily.   Yes Historical Provider, MD  torsemide (DEMADEX) 20 MG tablet Take 20 mg by mouth daily.    Yes Historical Provider, MD  traZODone (DESYREL) 100 MG tablet Take 100 mg by mouth at bedtime. 07/31/13  Yes Barton Fanny, MD  warfarin (COUMADIN) 10 MG tablet Take 5-10 mg by mouth daily. Takes 10mg  (1 tablet)  on Tuesday, Wednesday, Thursday, Friday,  & Sunday  Takes 5mg  (0.5 tablet) on Saturday and  Monday   Yes Historical Provider, MD    Allergies  Allergen Reactions  . Lisinopril Other (See Comments)    cough    Physical Exam  Vitals  Blood pressure 109/57, pulse 91, SpO2 98.00%.   1. General in no acute distress, very pleasant, obese African American male  2. Normal affect and insight, Not Suicidal or Homicidal, Awake Alert, Oriented X 3.  3. Neurologically :   Decreased sensations below the umbilicus   Positive Babinski signs bilaterally   Right leg hyper-reflexive compared to left   Absent multiple are on the left  4. Ears and Eyes appear Normal, Conjunctivae clear, PERRLA. Moist Oral Mucosa.  5. Supple Neck, No JVD, No cervical lymphadenopathy appriciated, No Carotid Bruits.  6. Symmetrical Chest wall movement, Good air movement bilaterally, CTAB.  7. RRR, No Gallops, Rubs or Murmurs, No Parasternal Heave.  8. Positive Bowel Sounds, Abdomen Soft, Non tender, No organomegaly appriciated,No rebound -guarding or rigidity.  9.  No Cyanosis, Normal Skin Turgor, No Skin Rash or Bruise.  10. Good muscle tone,  joints appear normal , no effusions, Normal ROM.  11. No Palpable Lymph Nodes in Neck or Axillae    Data Review  CBC  Recent Labs Lab 11/27/13 2120 11/27/13 2207 12/02/13 1702  WBC 8.0  --  7.9  HGB 13.8 15.0 14.4  HCT 42.6 44.0 44.2  PLT 220  --  254  MCV 89.3  --  89.3  MCH 28.9  --  29.1  MCHC 32.4  --  32.6  RDW 16.1*  --  16.2*  LYMPHSABS 1.4  --  1.6  MONOABS 0.6  --  0.6  EOSABS 0.2  --  0.3  BASOSABS 0.0  --  0.0   ------------------------------------------------------------------------------------------------------------------  Chemistries   Recent Labs Lab 11/27/13 2207 12/02/13 1702  NA 139 142  K 5.4* 4.0  CL 102 102  CO2  --  28  GLUCOSE 188* 97  BUN 16 14  CREATININE 0.90 0.75  CALCIUM  --  9.7    ------------------------------------------------------------------------------------------------------------------  Coagulation profile  Recent Labs Lab 11/27/13 2310 12/02/13 1702  INR 1.96* 1.02     ---------------------------------------------------------------------------------------------------------------    ----------------------------------------------------------------------------------------------------------------   Assessment & Plan  1. lumbar stenosis with gradual loss of motor power and sensations in his  lower extremities for the last few months, come in for evaluation. Seen by Dr. Saintclair Halsted in the emergency room for evaluation and he will schedule him for CT or MRI in the morning depending on availability, in light of his obesity. Dr. Saintclair Halsted explained to the patient that there might not be an regain in motor power and he will elaborate in the morning patient has been off Coumadin and might have to be started on heparin IV prior to bridging with Coumadin prior to discharge.  2. History of bilateral DVTs and PE on Coumadin. Off for the last 4 days  3. History of hypertension  4. Hyperglycemia   DVT Prophylaxis none  AM Labs Ordered, also please review Full Orders  Family Communication: Admission, patients condition and plan of care including tests being ordered have been discussed with the patient  who indicates understanding and agrees with the plan and Code Status.  Code Status full  Disposition Plan: Home with home health  Time spent in minutes : 31 minutes  Condition GUARDED

## 2013-12-02 NOTE — Telephone Encounter (Signed)
Please see other phone calls related to this pt. Pt has been advised to go to [facility] ED for iniation of work-up by ED physician (whom I have spoken with); ED physician will request a neurosurgical consult while pt is in the ED. This is the process advised by Elmyra Ricks (who spoke w/ Dr. Saintclair Halsted) in order to get the pt seen by a specialist who can decide on imaging and remainder of evaluation.

## 2013-12-02 NOTE — Telephone Encounter (Signed)
I have spoken with 2 radiologists about this pt and best way to image his T-spine. Dr. Carlis Abbott recommends letting surgical specialist determine most appropriate image, given that pt is on chronic Coumadin and may need a decompressive procedure. He states pt may need to be hospitalized for reversal of anti-coagulation >> heparin in preparation for CT myelogram. He states that recent imaging shows a lot of bony overgrowth w/ cord compression in mid to lower T-Spine; CT myelogram would be best imaging in this situation. I will contact Dr. Luiz Ochoa or one of his partners for facilitate a referral.

## 2013-12-02 NOTE — Telephone Encounter (Signed)
Dr. Leward Quan :  He went to [facility] and they told him to get in touch with you regarding outpatient imaging.  He said you would know exactly what he was talking about. Please call him at [phone]

## 2013-12-03 ENCOUNTER — Inpatient Hospital Stay (HOSPITAL_COMMUNITY): Payer: Medicare PPO

## 2013-12-03 LAB — COMPREHENSIVE METABOLIC PANEL
ALT: 28 U/L (ref 0–53)
AST: 27 U/L (ref 0–37)
Albumin: 3 g/dL — ABNORMAL LOW (ref 3.5–5.2)
Alkaline Phosphatase: 61 U/L (ref 39–117)
BUN: 14 mg/dL (ref 6–23)
CO2: 22 mEq/L (ref 19–32)
Calcium: 9 mg/dL (ref 8.4–10.5)
Chloride: 102 mEq/L (ref 96–112)
Creatinine, Ser: 0.75 mg/dL (ref 0.50–1.35)
GFR calc Af Amer: >90 mL/min (ref >90)
GFR calc non Af Amer: >90 mL/min (ref >90)
Glucose, Bld: 120 mg/dL — ABNORMAL HIGH (ref 70–99)
Potassium: 3.9 mEq/L (ref 3.7–5.3)
Sodium: 140 mEq/L (ref 137–147)
Total Bilirubin: 0.9 mg/dL (ref 0.3–1.2)
Total Protein: 7.4 g/dL (ref 6.0–8.3)

## 2013-12-03 LAB — PROTIME-INR
INR: 1.1 (ref 0.00–1.49)
Prothrombin Time: 14 seconds (ref 11.6–15.2)

## 2013-12-03 LAB — HEPARIN LEVEL (UNFRACTIONATED): Heparin Unfractionated: 0.1 IU/mL — ABNORMAL LOW (ref 0.30–0.70)

## 2013-12-03 LAB — GLUCOSE, CAPILLARY: Glucose-Capillary: 113 mg/dL — ABNORMAL HIGH (ref 70–99)

## 2013-12-03 MED ORDER — WARFARIN SODIUM 10 MG PO TABS
10.0000 mg | ORAL_TABLET | Freq: Once | ORAL | Status: AC
  Administered 2013-12-03: 10 mg via ORAL
  Filled 2013-12-03 (×2): qty 1

## 2013-12-03 MED ORDER — HEPARIN BOLUS VIA INFUSION
3000.0000 [IU] | Freq: Once | INTRAVENOUS | Status: AC
  Administered 2013-12-03: 3000 [IU] via INTRAVENOUS
  Filled 2013-12-03: qty 3000

## 2013-12-03 MED ORDER — HEPARIN BOLUS VIA INFUSION
5000.0000 [IU] | Freq: Once | INTRAVENOUS | Status: AC
  Administered 2013-12-03: 5000 [IU] via INTRAVENOUS
  Filled 2013-12-03: qty 5000

## 2013-12-03 MED ORDER — WARFARIN - PHARMACIST DOSING INPATIENT
Freq: Every day | Status: DC
  Administered 2013-12-04 – 2013-12-08 (×3)

## 2013-12-03 MED ORDER — HEPARIN (PORCINE) IN NACL 100-0.45 UNIT/ML-% IJ SOLN
2750.0000 [IU]/h | INTRAMUSCULAR | Status: DC
  Administered 2013-12-03 (×2): 1900 [IU]/h via INTRAVENOUS
  Administered 2013-12-03: 2200 [IU]/h via INTRAVENOUS
  Administered 2013-12-04: 2450 [IU]/h via INTRAVENOUS
  Administered 2013-12-04 – 2013-12-08 (×10): 2750 [IU]/h via INTRAVENOUS
  Filled 2013-12-03 (×18): qty 250

## 2013-12-03 MED ORDER — GADOBENATE DIMEGLUMINE 529 MG/ML IV SOLN
20.0000 mL | Freq: Once | INTRAVENOUS | Status: AC | PRN
  Administered 2013-12-03: 20 mL via INTRAVENOUS

## 2013-12-03 NOTE — Progress Notes (Signed)
[facility] PROGRESS NOTE  Nicolas [name] SWH:[id number] DOB: 1966/11/16 DOA: 12/02/2013 PCP: Ellsworth Lennox, MD  Assessment/Plan: lumbar stenosis with gradual loss of motor power and sensations in his lower extremities for the last few months, come in for evaluation. Seen by Dr. Saintclair Halsted in the emergency room for evaluation and he will schedule him for CT or MRI in the morning depending on availability  Dr. Saintclair Halsted explained to the patient that there might not regain motor power  IV heparin to bridge to coumadin (will start coumadin once we know there will be no surgery)  History of bilateral DVTs and PE on Coumadin. Off for the last 4 days   History of hypertension    Hyperglycemia  Morbid obesity   Code Status: full Family Communication: patient Disposition Plan: admit   Consultants:  NS  Procedures:    Antibiotics:    HPI/Subjective: No chest pain No SOB No fever No chills  Objective: Filed Vitals:   12/03/13 0742  BP: 124/78  Pulse: 90  Temp: 97.3 F (36.3 C)  Resp: 22    Intake/Output Summary (Last 24 hours) at 12/03/13 0959 Last data filed at 12/03/13 0600  Gross per 24 hour  Intake    240 ml  Output      0 ml  Net    240 ml   Filed Weights   12/02/13 2045  Weight: 162.66 kg (358 lb 9.6 oz)    Exam:   General:  A+Ox3,NAD  Cardiovascular: rrr  Respiratory: clear, decreased due to body habitus  Abdomen: obese, +BS  Musculoskeletal: decreased sensation in b/l LE  Data Reviewed: Basic Metabolic Panel:  Recent Labs Lab 11/27/13 2207 12/02/13 1702 12/03/13 0609  NA 139 142 140  K 5.4* 4.0 3.9  CL 102 102 102  CO2  --  28 22  GLUCOSE 188* 97 120*  BUN 16 14 14   CREATININE 0.90 0.75 0.75  CALCIUM  --  9.7 9.0   Liver Function Tests:  Recent Labs Lab 12/03/13 0609  AST 27  ALT 28  ALKPHOS 61  BILITOT 0.9  PROT 7.4  ALBUMIN 3.0*   No results found for this basename: LIPASE, AMYLASE,  in the last 168 hours No  results found for this basename: AMMONIA,  in the last 168 hours CBC:  Recent Labs Lab 11/27/13 2120 11/27/13 2207 12/02/13 1702  WBC 8.0  --  7.9  NEUTROABS 5.7  --  5.4  HGB 13.8 15.0 14.4  HCT 42.6 44.0 44.2  MCV 89.3  --  89.3  PLT 220  --  254   Cardiac Enzymes: No results found for this basename: CKTOTAL, CKMB, CKMBINDEX, TROPONINI,  in the last 168 hours BNP (last 3 results) No results found for this basename: PROBNP,  in the last 8760 hours CBG:  Recent Labs Lab 11/28/13 0848 12/02/13 2049 12/03/13 0741  GLUCAP 125* 128* 113*    No results found for this or any previous visit (from the past 240 hour(s)).   Studies: No results found.  Scheduled Meds: . ammonium lactate  1 application Topical BID  . docusate sodium  100 mg Oral BID  . influenza vac split quadrivalent PF  0.5 mL Intramuscular Tomorrow-1000  . metoprolol succinate  50 mg Oral Daily  . sertraline  75 mg Oral Daily  . traZODone  100 mg Oral QHS   Continuous Infusions:   Active Problems:   Spinal stenosis of lumbar region   Spinal stenosis    Time spent:  35 min    Nicolas [name]  [facility] Pager [phone] If 7PM-7AM, please contact night-coverage at [email or web address], password [facility] 12/03/2013, 9:59 AM  LOS: 1 day

## 2013-12-03 NOTE — Progress Notes (Signed)
Patient ID: Nicolas [name], male   DOB: 1966/03/17, 48 y.o.   MRN: [id number] Patient remains complete T8 paraplegic his MRI scan does show stenosis probably at T7-8 less so at T11-12. With decompressive laminectomies around this. I've explained the patient again to reiterate were told last night that after 3 months of complete loss of motor sensation and bowel bladder there is no chance of reversing the thoracic spinal cord injury and that this may very well represent in addition to compression ischemic injury thoracic cord and is irreversible. I explained that surgery decompressive thoracic cord would therefore not be indicated as no chance of functional recovery would be achieved. The risks going through an operation under general anesthesia with his medical comorbidities present a high rate of infection which could be a life-threatening nonhealing wound dehiscence and significantly decreasing his current quality of life. The patient and his representative in the room also were disappointed with this and really want to pursue surgical correction. I strongly disagree with this and do not recommend it. They have expressed wanting a second opinion which there is certainly entitled to and may seek attention either [facility] or with his previous spine surgeons that operated on his back as recently as a year ago. We have discussed this as I have discussed it with partners and we all agree on this course of action.

## 2013-12-03 NOTE — Progress Notes (Signed)
ANTICOAGULATION CONSULT NOTE - Initial Consult  Pharmacy Consult for Heparin Indication: History of DVTs / PE (off of Coumadin)  Allergies  Allergen Reactions  . Lisinopril Other (See Comments)    cough    Patient Measurements: Height: 5\' 10"  (177.8 cm) Weight: 358 lb 9.6 oz (162.66 kg) IBW/kg (Calculated) : 73 Heparin Dosing Weight: 115 kg  Vital Signs: Temp: 97.3 F (36.3 C) (01/08 0742) Temp src: Oral (01/08 0742) BP: 124/78 mmHg (01/08 0742) Pulse Rate: 90 (01/08 0742)  Labs:  Recent Labs  12/02/13 1702 12/03/13 0609  HGB 14.4  --   HCT 44.2  --   PLT 254  --   LABPROT 13.2 14.0  INR 1.02 1.10  CREATININE 0.75 0.75    Estimated Creatinine Clearance: 177.7 ml/min (by C-G formula based on Cr of 0.75).   Medical History: Past Medical History  Diagnosis Date  . Arthritis   . Clotting disorder   . Hypertension   . Pulmonary embolism     x 3 in 2005, 2008, 2010  . DVT (deep venous thrombosis)     Summer 2012  . Spinal stenosis   . Depression   . Sleep apnea   . Diabetes mellitus without complication     Assessment: 48 year old male admitted with lumbar stenosis with gradual loss of motor power and sensations in his lower extremities.  He was on Coumadin PTA for PE / DVT history, which he has been off of for the last 4 days.  INR is at baseline.  To begin IV heparin as a bridge until plans for surgery determined.  Goal of Therapy:  Heparin level 0.3-0.7 units/ml Monitor platelets by anticoagulation protocol: Yes   Plan:  1) Heparin 5000 units iv bolus x 1 2) Heparin drip at 1900 units / hr 3) Heparin level 6 hours after heparin begins 4) Daily heparin level, CBC  Thank you. Anette Guarneri, PharmD [phone]   12/03/2013,10:16 AM

## 2013-12-03 NOTE — Progress Notes (Addendum)
ANTICOAGULATION CONSULT NOTE - Initial Consult  Pharmacy Consult for warfarin Indication: History of DVT and PE  Allergies  Allergen Reactions  . Lisinopril Other (See Comments)    cough    Patient Measurements: Height: 5\' 10"  (177.8 cm) Weight: 358 lb 9.6 oz (162.66 kg) IBW/kg (Calculated) : 73  Vital Signs: Temp: 99 F (37.2 C) (01/08 1259) Temp src: Oral (01/08 1259) BP: 138/80 mmHg (01/08 1259) Pulse Rate: 78 (01/08 1259)  Labs:  Recent Labs  12/02/13 1702 12/03/13 0609  HGB 14.4  --   HCT 44.2  --   PLT 254  --   LABPROT 13.2 14.0  INR 1.02 1.10  CREATININE 0.75 0.75    Estimated Creatinine Clearance: 177.7 ml/min (by C-G formula based on Cr of 0.75).   Medical History: Past Medical History  Diagnosis Date  . Arthritis   . Clotting disorder   . Hypertension   . Pulmonary embolism     x 3 in 2005, 2008, 2010  . DVT (deep venous thrombosis)     Summer 2012  . Spinal stenosis   . Depression   . Sleep apnea   . Diabetes mellitus without complication     Medications:  Prescriptions prior to admission  Medication Sig Dispense Refill  . ammonium lactate (AMLACTIN) 12 % cream Apply 1 g topically 2 (two) times daily.      . diazepam (VALIUM) 10 MG tablet Take 1 tablet by mouth every 8 hours if needed for anxiety  60 tablet  0  . docusate sodium (COLACE) 100 MG capsule Take 1 capsule (100 mg total) by mouth every 12 (twelve) hours.  60 capsule  0  . ketoconazole (NIZORAL) 2 % shampoo Apply 1 application topically daily.      . metFORMIN (GLUCOPHAGE) 500 MG tablet Take 500 mg by mouth every evening.       . metoprolol succinate (TOPROL-XL) 50 MG 24 hr tablet Take 1 tablet (50 mg total) by mouth daily.  30 tablet  5  . oxyCODONE-acetaminophen (PERCOCET/ROXICET) 5-325 MG per tablet Take 1-2 tablets as needed every 6 hours for severe pain.  60 tablet  0  . potassium chloride SA (KLOR-CON M20) 20 MEQ tablet Take 1 tablet (20 mEq total) by mouth 2 (two) times  daily.  60 tablet  2  . sertraline (ZOLOFT) 50 MG tablet Take 75 mg by mouth daily.      Marland Kitchen torsemide (DEMADEX) 20 MG tablet Take 20 mg by mouth daily.       . traZODone (DESYREL) 100 MG tablet Take 100 mg by mouth at bedtime.      Marland Kitchen warfarin (COUMADIN) 10 MG tablet Take 5-10 mg by mouth daily. Takes 10mg  (1 tablet)  on Tuesday, Wednesday, Thursday, Friday,  & Sunday  Takes 5mg  (0.5 tablet) on Saturday and Monday        Assessment: Pt with chronic back pain, numbness, paralysis.  Has significant history of VTEs - DVT in 2012, PE in 2005, 2008, 2010.  Pt is on warfarin prior to admission.  It has been held for 4 days, pt has been on heparin drip while inpatient, now transitioning back to warfarin.  INR today is 1.1. CBC is stable.   Goal of Therapy:  INR 2-3 Monitor platelets by anticoagulation protocol: Yes   Plan:  Warfarin 10 mg po x1 Daily INR  Hughes Better, PharmD, BCPS Clinical Pharmacist 12/03/2013 5:04 PM   Heparin drip Level is subtherapeutic at 0.1, INR is 1.1.  CBC is stable.   Verified with nurse, no interruptions or problems with infusion.  Will give heparin bolus 3000 units x1 Increase heparin rate to 2200 units/hr HL at 0200 12/04/13 Daily CBC  Hughes Better, PharmD, BCPS Clinical Pharmacist 12/03/2013 7:18 PM

## 2013-12-04 DIAGNOSIS — M4800 Spinal stenosis, site unspecified: Secondary | ICD-10-CM

## 2013-12-04 LAB — CBC
HCT: 40.5 % (ref 39.0–52.0)
Hemoglobin: 13.1 g/dL (ref 13.0–17.0)
MCH: 28.9 pg (ref 26.0–34.0)
MCHC: 32.3 g/dL (ref 30.0–36.0)
MCV: 89.4 fL (ref 78.0–100.0)
Platelets: 215 10*3/uL (ref 150–400)
RBC: 4.53 MIL/uL (ref 4.22–5.81)
RDW: 16.2 % — ABNORMAL HIGH (ref 11.5–15.5)
WBC: 7.4 10*3/uL (ref 4.0–10.5)

## 2013-12-04 LAB — HEPARIN LEVEL (UNFRACTIONATED)
Heparin Unfractionated: 0.28 IU/mL — ABNORMAL LOW (ref 0.30–0.70)
Heparin Unfractionated: 0.19 IU/mL — ABNORMAL LOW (ref 0.30–0.70)
Heparin Unfractionated: 0.56 IU/mL (ref 0.30–0.70)

## 2013-12-04 LAB — PROTIME-INR
INR: 1.07 (ref 0.00–1.49)
Prothrombin Time: 13.7 seconds (ref 11.6–15.2)

## 2013-12-04 MED ORDER — WARFARIN SODIUM 2.5 MG PO TABS
12.5000 mg | ORAL_TABLET | Freq: Once | ORAL | Status: AC
  Administered 2013-12-04: 12.5 mg via ORAL
  Filled 2013-12-04 (×2): qty 1

## 2013-12-04 NOTE — Progress Notes (Addendum)
[place] for Heparin / Coumadin Indication: DVT/PE  Allergies  Allergen Reactions  . Lisinopril Other (See Comments)    cough    Patient Measurements: Height: 5\' 10"  (177.8 cm) Weight: 362 lb 1.6 oz (164.247 kg) IBW/kg (Calculated) : 73  Vital Signs: Temp: 98.2 F (36.8 C) (01/09 1236) Temp src: Oral (01/09 1236) BP: 131/82 mmHg (01/09 1236) Pulse Rate: 87 (01/09 1236)  Labs:  Recent Labs  12/02/13 1702 12/03/13 0609 12/03/13 1700 12/04/13 0259 12/04/13 1155  HGB 14.4  --   --  13.1  --   HCT 44.2  --   --  40.5  --   PLT 254  --   --  215  --   LABPROT 13.2 14.0  --   --  13.7  INR 1.02 1.10  --   --  1.07  HEPARINUNFRC  --   --  0.10* 0.28* 0.19*  CREATININE 0.75 0.75  --   --   --     Estimated Creatinine Clearance: 178.7 ml/min (by C-G formula based on Cr of 0.75).  Assessment: 48 yo male with h/o DVT/PE for heparin / Coumadin Heparin level still low at 0.19  Goal of Therapy:  Heparin level 0.3-0.7 INR 2-3 Monitor platelets by anticoagulation protocol: Yes   Plan:  Increase Heparin to 2750 units/hr Check heparin level at 2100 pm Coumadin 12.5 mg po x 1  Thank you. Anette Guarneri, PharmD [phone]  Addum:  Heparin level therapeutic.

## 2013-12-04 NOTE — Evaluation (Signed)
Physical Therapy Evaluation Patient Details Name: Nicolas [name] MRN: [id number] DOB: [birth year] Today's Date: 12/04/2013 Time: 1445-1510 PT Time Calculation (min): 25 min  PT Assessment / Plan / Recommendation History of Present Illness  Nicolas [name]  is a 48 y.o. male, with past medical history significant for spinal stenosis diagnosed in 2004 who fell from his old wound September 1 and started complaining of numbness in the lower extremities, his symptoms got worse and he became incontinent of bladder and bowel. Lately he discussed the case with his physician Nicolas [name] home advised him to get admitted to for a CT or an MRI of the spine, since he weighs around 400 pounds  Clinical Impression  Patient presents with decreased independence with mobility due to LE weakness and sensation loss.  Will need skilled PT in the acute setting to maximize mobility to decrease burden of care in next venue.  Feel will need SNF level rehab due to living alone.  Patient hopes to get second opinion in [place] and would like to do rehab in [facility] prefers a facility that he is aware of near [place]  Patient needs continued PT services    Follow Up Recommendations  SNF    Does the patient have the potential to tolerate intense rehabilitation    N/A  Barriers to Discharge Decreased caregiver support      Equipment Recommendations  None recommended by PT    Recommendations for Other Services   OT consult  Frequency Min 3X/week    Precautions / Restrictions Precautions Precautions: Fall Precaution Comments: bowel and bladder incontinence   Pertinent Vitals/Pain C/o pain in buttocks 8/10 had medication about hour prior to eval      Mobility  Bed Mobility Overal bed mobility: Needs Assistance Bed Mobility: Rolling;Supine to Sit;Sit to Supine Rolling: Supervision Supine to sit: Max assist;HOB elevated Sit to supine: Mod assist General bed  mobility comments: using rails for mobility; able to roll with rails, assist for feet off bed and to lift trunk with rail and elevated head of bed to sit; performed lateral leans in sitting to change linens with rails for support and assist to lift leg on either side, educated on importance of rolling to reduce pressure on bottom and importance of calling for assist as soon as he notices he has soiled himself Transfers Overall transfer level: Needs assistance Transfers: Lateral/Scoot Transfers  Lateral/Scoot Transfers: Total assist General transfer comment: unable to scoot laterally on bed with assist of one (pt with long trunk and short arms)  Educated on transfer boards and benefit of [employer] board to diminish sheer on insensate bottom with transfers to reduce risk of pressure sores    Exercises General Exercises - Lower Extremity Ankle Circles/Pumps: PROM;Both;5 reps;Supine Heel Slides: 5 reps;Both;Supine;PROM Hip ABduction/ADduction: PROM;Supine;5 reps;Both   PT Diagnosis: Generalized weakness (paraplegia)  PT Problem List: Decreased strength;Decreased activity tolerance;Decreased balance;Decreased mobility;Impaired sensation;Decreased knowledge of precautions;Decreased safety awareness;Decreased knowledge of use of DME PT Treatment Interventions: Balance training;Neuromuscular re-education;DME instruction;Functional mobility training;Patient/family education;Therapeutic activities;Wheelchair mobility training;Therapeutic exercise     PT Goals(Current goals can be found in the care plan section) Acute Rehab PT Goals Patient Stated Goal: To get second opinion PT Goal Formulation: With patient Time For Goal Achievement: 12/18/13 Potential to Achieve Goals: Good  Visit Information  Last PT Received On: 12/04/13 Assistance Needed: +3 or more (for transfers; +1-2 for sitting activities) History of Present Illness: Nicolas [name]  is a 48 y.o. male,  with past medical history significant for  spinal stenosis diagnosed in 2004 who fell from his old wound September 1 and started complaining of numbness in the lower extremities, his symptoms got worse and he became incontinent of bladder and bowel. Lately he discussed the case with his physician Nicolas [name] home advised him to get admitted to for a CT or an MRI of the spine, since he weighs around 400 pounds       Prior [place] expects to be discharged to:: [place]: Children Available Help at Discharge: Family Type of Home: House Home Access: Ramped entrance [place]: One Cylinder: Transport planner;Bedside commode;Hospital bed;Walker - 2 wheels;Wheelchair - Brewing technologist;Other (comment) (hoyer lift) Additional Comments: daughter lilves with pt works days and school at night; brother staying temporarily Prior Function Level of Independence: Needs assistance Gait / Transfers Assistance Needed: within less than a month has lost ability to stand pivot with walker to hover-round and to toilet; now just using hoyer lift, is incontinent and gets around in hoverround Communication Communication: No difficulties Dominant Hand: Right    Cognition  Cognition Arousal/Alertness: Awake/alert Behavior During Therapy: WFL for tasks assessed/performed;Impulsive Overall Cognitive Status: Within Functional Limits for tasks assessed    Extremity/Trunk Assessment Upper Extremity Assessment Upper Extremity Assessment: Overall WFL for tasks assessed Lower Extremity Assessment RLE Deficits / Details: PROM WFL except ankle DF -10; positive extensor tone with great toe hyperextension and increased time to flex ankle passively; strenth 0-1/5 throughout LE's RLE Sensation: decreased light touch;decreased proprioception;history of peripheral neuropathy (absent sensation below navel) LLE Deficits / Details: PROM WFL except ankle DF -10; positive extensor tone  with great toe hyperextension and increased time to flex ankle passively; strenth 0-1/5 throughout LE's (absent sensation below navel) LLE Sensation: decreased light touch;decreased proprioception;history of peripheral neuropathy   Balance Balance Overall balance assessment: Needs assistance Sitting-balance support: Feet unsupported;Single extremity supported Sitting balance-Leahy Scale: Fair Dynamic Sitting - Comments: tendency to fall to right and anterior; can accept challenge towards left and posterior  End of Session PT - End of Session Activity Tolerance: Patient tolerated treatment well Patient left: in bed;with call bell/phone within reach;with bed alarm set  GP     [facility] 12/04/2013, 3:27 PM  Nicolas [name], Nicolas [name] 12/04/2013

## 2013-12-04 NOTE — Progress Notes (Signed)
[place] for Heparin Indication: DVT/PE  Allergies  Allergen Reactions  . Lisinopril Other (See Comments)    cough    Patient Measurements: Height: 5\' 10"  (177.8 cm) Weight: 362 lb 1.6 oz (164.247 kg) IBW/kg (Calculated) : 73  Vital Signs: Temp: 99 F (37.2 C) (01/08 2241) Temp src: Oral (01/08 2241) BP: 167/84 mmHg (01/08 2241) Pulse Rate: 75 (01/08 2241)  Labs:  Recent Labs  12/02/13 1702 12/03/13 0609 12/03/13 1700 12/04/13 0259  HGB 14.4  --   --  13.1  HCT 44.2  --   --  40.5  PLT 254  --   --  215  LABPROT 13.2 14.0  --   --   INR 1.02 1.10  --   --   HEPARINUNFRC  --   --  0.10* 0.28*  CREATININE 0.75 0.75  --   --     Estimated Creatinine Clearance: 178.7 ml/min (by C-G formula based on Cr of 0.75).  Assessment: 47 yo male with h/o DVT/PE for heparin  Goal of Therapy:  Heparin level 0.3-0.7 INR 2-3 Monitor platelets by anticoagulation protocol: Yes   Plan:  Increase Heparin 2450 units/hr Check heparin level in 8 hours.  Phillis Knack, PharmD, BCPS

## 2013-12-04 NOTE — Progress Notes (Signed)
Nicolas [name] PROGRESS NOTE  Nicolas [name] PNT:[id number] DOB: July 27, 1966 DOA: 12/02/2013 PCP: Nicolas Lennox, MD  Assessment/Plan: lumbar stenosis with gradual loss of motor power and sensations in his lower extremities for the last few months, come in for evaluation.  MRI with multiple issues- Dr. Saintclair Halsted does not recommend surgery -made appointment with Dr. 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 Off for the last 4 days  -heparin/lovenox bridge to coumadin  History of hypertension    Hyperglycemia  Morbid obesity   Code Status: full Family Communication: patient Disposition Plan: admit   Consultants:  NS  Procedures:    Antibiotics:    HPI/Subjective: No chest pain No SOB No fever No chills  Objective: Filed Vitals:   12/04/13 0825  BP: 151/91  Pulse: 74  Temp: 98 F (36.7 C)  Resp: 20    Intake/Output Summary (Last 24 hours) at 12/04/13 0941 Last data filed at 12/04/13 0814  Gross per 24 hour  Intake   1124 ml  Output      0 ml  Net   1124 ml   Filed Weights   12/02/13 2045 12/03/13 2241  Weight: 162.66 kg (358 lb 9.6 oz) 164.247 kg (362 lb 1.6 oz)    Exam:   General:  A+Ox3,NAD  Cardiovascular: rrr  Respiratory: clear, decreased due to body habitus  Abdomen: obese, +BS  Musculoskeletal: decreased sensation in b/l LE  Data Reviewed: Basic Metabolic Panel:  Recent Labs Lab 11/27/13 2207 12/02/13 1702 12/03/13 0609  NA 139 142 140  K 5.4* 4.0 3.9  CL 102 102 102  CO2  --  28 22  GLUCOSE 188* 97 120*  BUN 16 14 14   CREATININE 0.90 0.75 0.75  CALCIUM  --  9.7 9.0   Liver Function Tests:  Recent Labs Lab 12/03/13 0609  AST 27  ALT 28  ALKPHOS 61  BILITOT 0.9  PROT 7.4  ALBUMIN 3.0*   No results found for this basename: LIPASE, AMYLASE,  in the last 168 hours No results found for this basename: AMMONIA,  in the last 168 hours CBC:  Recent  Labs Lab 11/27/13 2120 11/27/13 2207 12/02/13 1702 12/04/13 0259  WBC 8.0  --  7.9 7.4  NEUTROABS 5.7  --  5.4  --   HGB 13.8 15.0 14.4 13.1  HCT 42.6 44.0 44.2 40.5  MCV 89.3  --  89.3 89.4  PLT 220  --  254 215   Cardiac Enzymes: No results found for this basename: CKTOTAL, CKMB, CKMBINDEX, TROPONINI,  in the last 168 hours BNP (last 3 results) No results found for this basename: PROBNP,  in the last 8760 hours CBG:  Recent Labs Lab 11/28/13 0848 12/02/13 2049 12/03/13 0741  GLUCAP 125* 128* 113*    No results found for this or any previous visit (from the past 240 hour(s)).   Studies: Mr Thoracic Spine W Wo Contrast  12/03/2013   CLINICAL DATA:  Bilateral lower extremity weakness  EXAM: MRI THORACIC SPINE WITHOUT AND WITH CONTRAST  TECHNIQUE: Multiplanar and multiecho pulse sequences of the thoracic spine were obtained without and with intravenous contrast.  CONTRAST:  44mL MULTIHANCE GADOBENATE DIMEGLUMINE 529 MG/ML IV SOLN  COMPARISON:  Thoracic spine radiographs 11/10/2013. CT chest 10/16/2003.  FINDINGS: There are extensive surgical changes involving the thoracic spine with multilevel decompressive laminectomies. Surgical changes are noted from T2-3 down to T6-7. There is also laminectomies from T8  down to T11. Small focus of cord signal abnormality due noted at T8-9 is likely myelomalacia.  The thoracic vertebral bodies are normally aligned. They demonstrate normal marrow signal.  T1-2:  No significant findings.  T2-3: Mild osteophytic ridging but no significant spinal stenosis. Moderate facet disease.  T3-4: Postoperative changes. No significant spinal stenosis. Moderate facet disease.  T4-5: Postoperative changes with decompressive laminectomy. No significant spinal stenosis.  T5-6:  Decompressive laminectomy.  No significant spinal stenosis.  T6-7: Decompressive laminectomy. There is advanced facet disease with mild impression on the right side of the thecal sac. No  significant spinal stenosis.  T7-8: Severe facet disease, greater on the right with mass effect on the thecal sac and spinal cord most notably just above the disc space level. There is approximately 50% canal compromise.  T7-8: Decompressive laminectomy. Severe facet disease with mild mass effect on the left side of the thecal sac but no significant spinal stenosis. Area of myelomalacia noted.  T9-10: Decompressive laminectomy.  No significant spinal stenosis.  T10-11: Decompressive laminectomy. Severe facet disease but no significant spinal stenosis.  T11-12: Facet disease and ligamentum flavum thickening with mild spinal stenosis.  T12-L1:  No significant findings.  IMPRESSION: 1. Multilevel surgical decompressions posteriorly. 2. Myelomalacia noted at T8-9. 3. Significant spinal stenosis at T7-8. 4. Mild spinal stenosis at T11-12.   Electronically Signed   By: Nicolas [name] M.D.   On: 12/03/2013 13:21    Scheduled Meds: . ammonium lactate  1 application Topical BID  . docusate sodium  100 mg Oral BID  . metoprolol succinate  50 mg Oral Daily  . sertraline  75 mg Oral Daily  . traZODone  100 mg Oral QHS  . Warfarin - Pharmacist Dosing Inpatient   Does not apply q1800   Continuous Infusions: . heparin 2,450 Units/hr (12/04/13 0102)    Active Problems:   Spinal stenosis of lumbar region   Spinal stenosis    Time spent: 35 min    Nicolas [name]  Nicolas [name] Pager [phone] If 7PM-7AM, please contact night-coverage at [email or web address], password Nicolas [name] 12/04/2013, 9:41 AM  LOS: 2 days

## 2013-12-05 LAB — HEPARIN LEVEL (UNFRACTIONATED): Heparin Unfractionated: 0.47 IU/mL (ref 0.30–0.70)

## 2013-12-05 LAB — PROTIME-INR
INR: 1.15 (ref 0.00–1.49)
Prothrombin Time: 14.5 seconds (ref 11.6–15.2)

## 2013-12-05 LAB — CBC
HCT: 39.6 % (ref 39.0–52.0)
Hemoglobin: 12.6 g/dL — ABNORMAL LOW (ref 13.0–17.0)
MCH: 28.3 pg (ref 26.0–34.0)
MCHC: 31.8 g/dL (ref 30.0–36.0)
MCV: 89 fL (ref 78.0–100.0)
Platelets: 208 10*3/uL (ref 150–400)
RBC: 4.45 MIL/uL (ref 4.22–5.81)
RDW: 16.3 % — ABNORMAL HIGH (ref 11.5–15.5)
WBC: 6.7 10*3/uL (ref 4.0–10.5)

## 2013-12-05 LAB — GLUCOSE, CAPILLARY: Glucose-Capillary: 105 mg/dL — ABNORMAL HIGH (ref 70–99)

## 2013-12-05 MED ORDER — WARFARIN SODIUM 7.5 MG PO TABS
15.0000 mg | ORAL_TABLET | Freq: Once | ORAL | Status: AC
  Administered 2013-12-05: 15 mg via ORAL
  Filled 2013-12-05: qty 2

## 2013-12-05 MED ORDER — DIAZEPAM 5 MG PO TABS
10.0000 mg | ORAL_TABLET | Freq: Three times a day (TID) | ORAL | Status: DC | PRN
  Administered 2013-12-05 – 2013-12-08 (×7): 10 mg via ORAL
  Filled 2013-12-05 (×7): qty 2

## 2013-12-05 MED ORDER — DIAZEPAM 5 MG PO TABS: 10.0000 mg | ORAL_TABLET | Freq: Three times a day (TID) | ORAL | Status: DC | PRN

## 2013-12-05 NOTE — Progress Notes (Signed)
[place] for Heparin / Coumadin Indication: DVT/PE  Allergies  Allergen Reactions  . Lisinopril Other (See Comments)    cough    Patient Measurements: Height: 5\' 10"  (177.8 cm) Weight: 361 lb 14.4 oz (164.157 kg) IBW/kg (Calculated) : 73  Vital Signs: Temp: 97.4 F (36.3 C) (01/10 0916) Temp src: Oral (01/10 0916) BP: 154/94 mmHg (01/10 0916) Pulse Rate: 70 (01/10 0916)  Labs:  Recent Labs  12/02/13 1702 12/03/13 0609  12/04/13 0259 12/04/13 1155 12/04/13 2040 12/05/13 0418  HGB 14.4  --   --  13.1  --   --  12.6*  HCT 44.2  --   --  40.5  --   --  39.6  PLT 254  --   --  215  --   --  208  LABPROT 13.2 14.0  --   --  13.7  --  14.5  INR 1.02 1.10  --   --  1.07  --  1.15  HEPARINUNFRC  --   --   < > 0.28* 0.19* 0.56 0.47  CREATININE 0.75 0.75  --   --   --   --   --   < > = values in this interval not displayed.  Estimated Creatinine Clearance: 178.7 ml/min (by C-G formula based on Cr of 0.75).  Assessment: 48 yo male with h/o DVT/PE for heparin / Coumadin Heparin now therapeutic, 01/10 HL = 0.47  INR slow to rise, 1.15 this AM after 2 days of 10mg  and 12.5mg  (PTA dose 10mg  daily except 5mg  on Mon / Sat PTA)  Goal of Therapy:  Heparin level 0.3-0.7 INR 2-3 Monitor platelets by anticoagulation protocol: Yes   Plan:  Continue Heparin at 2750 units/hr INR slow to rise, will give coumadin 15 mg x 1 tonight (3rd dose) - likely to see effects of previous doses tomorrow  Daily HL  Sharece Fleischhacker B. Leitha Schuller, PharmD Clinical Pharmacist - Resident Pager: [phone] Phone: [phone] 12/05/2013 12:12 PM

## 2013-12-05 NOTE — Progress Notes (Signed)
[facility] PROGRESS NOTE  Nicolas [name] FBP:[id number] DOB: Apr 18, 1966 DOA: 12/02/2013 PCP: Ellsworth Lennox, MD  Assessment/Plan: lumbar stenosis with gradual loss of motor power and sensations in his lower extremities for the last few months, come in for evaluation.  MRI with multiple issues- Dr. Saintclair Halsted does not recommend surgery -made appointment with Dr. 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 Off for the last 4 days  -heparin/lovenox bridge to coumadin  History of hypertension    Hyperglycemia  Morbid obesity   Code Status: full Family Communication: patient Disposition Plan: admit   Consultants:  NS  Procedures:    Antibiotics:    HPI/Subjective: No chest pain No SOB No fever No chills  Objective: Filed Vitals:   12/05/13 0916  BP: 154/94  Pulse: 70  Temp: 97.4 F (36.3 C)  Resp: 18    Intake/Output Summary (Last 24 hours) at 12/05/13 1006 Last data filed at 12/05/13 0932  Gross per 24 hour  Intake   1410 ml  Output      0 ml  Net   1410 ml   Filed Weights   12/02/13 2045 12/03/13 2241 12/04/13 2205  Weight: 162.66 kg (358 lb 9.6 oz) 164.247 kg (362 lb 1.6 oz) 164.157 kg (361 lb 14.4 oz)    Exam:   General:  A+Ox3,NAD  Cardiovascular: rrr  Respiratory: clear, decreased due to body habitus  Abdomen: obese, +BS  Musculoskeletal: decreased sensation in b/l LE, moves left more than right  Data Reviewed: Basic Metabolic Panel:  Recent Labs Lab 12/02/13 1702 12/03/13 0609  NA 142 140  K 4.0 3.9  CL 102 102  CO2 28 22  GLUCOSE 97 120*  BUN 14 14  CREATININE 0.75 0.75  CALCIUM 9.7 9.0   Liver Function Tests:  Recent Labs Lab 12/03/13 0609  AST 27  ALT 28  ALKPHOS 61  BILITOT 0.9  PROT 7.4  ALBUMIN 3.0*   No results found for this basename: LIPASE, AMYLASE,  in the last 168 hours No results found for this basename: AMMONIA,  in the last 168  hours CBC:  Recent Labs Lab 12/02/13 1702 12/04/13 0259 12/05/13 0418  WBC 7.9 7.4 6.7  NEUTROABS 5.4  --   --   HGB 14.4 13.1 12.6*  HCT 44.2 40.5 39.6  MCV 89.3 89.4 89.0  PLT 254 215 208   Cardiac Enzymes: No results found for this basename: CKTOTAL, CKMB, CKMBINDEX, TROPONINI,  in the last 168 hours BNP (last 3 results) No results found for this basename: PROBNP,  in the last 8760 hours CBG:  Recent Labs Lab 12/02/13 2049 12/03/13 0741  GLUCAP 128* 113*    No results found for this or any previous visit (from the past 240 hour(s)).   Studies: Mr Thoracic Spine W Wo Contrast  12/03/2013   CLINICAL DATA:  Bilateral lower extremity weakness  EXAM: MRI THORACIC SPINE WITHOUT AND WITH CONTRAST  TECHNIQUE: Multiplanar and multiecho pulse sequences of the thoracic spine were obtained without and with intravenous contrast.  CONTRAST:  8mL MULTIHANCE GADOBENATE DIMEGLUMINE 529 MG/ML IV SOLN  COMPARISON:  Thoracic spine radiographs 11/10/2013. CT chest 10/16/2003.  FINDINGS: There are extensive surgical changes involving the thoracic spine with multilevel decompressive laminectomies. Surgical changes are noted from T2-3 down to T6-7. There is also laminectomies from T8 down to T11. Small focus of cord signal abnormality due noted at T8-9 is likely myelomalacia.  The thoracic  vertebral bodies are normally aligned. They demonstrate normal marrow signal.  T1-2:  No significant findings.  T2-3: Mild osteophytic ridging but no significant spinal stenosis. Moderate facet disease.  T3-4: Postoperative changes. No significant spinal stenosis. Moderate facet disease.  T4-5: Postoperative changes with decompressive laminectomy. No significant spinal stenosis.  T5-6:  Decompressive laminectomy.  No significant spinal stenosis.  T6-7: Decompressive laminectomy. There is advanced facet disease with mild impression on the right side of the thecal sac. No significant spinal stenosis.  T7-8: Severe facet  disease, greater on the right with mass effect on the thecal sac and spinal cord most notably just above the disc space level. There is approximately 50% canal compromise.  T7-8: Decompressive laminectomy. Severe facet disease with mild mass effect on the left side of the thecal sac but no significant spinal stenosis. Area of myelomalacia noted.  T9-10: Decompressive laminectomy.  No significant spinal stenosis.  T10-11: Decompressive laminectomy. Severe facet disease but no significant spinal stenosis.  T11-12: Facet disease and ligamentum flavum thickening with mild spinal stenosis.  T12-L1:  No significant findings.  IMPRESSION: 1. Multilevel surgical decompressions posteriorly. 2. Myelomalacia noted at T8-9. 3. Significant spinal stenosis at T7-8. 4. Mild spinal stenosis at T11-12.   Electronically Signed   By: Kalman Jewels M.D.   On: 12/03/2013 13:21    Scheduled Meds: . ammonium lactate  1 application Topical BID  . docusate sodium  100 mg Oral BID  . metoprolol succinate  50 mg Oral Daily  . sertraline  75 mg Oral Daily  . traZODone  100 mg Oral QHS  . Warfarin - Pharmacist Dosing Inpatient   Does not apply q1800   Continuous Infusions: . heparin 2,750 Units/hr (12/05/13 0830)    Active Problems:   Spinal stenosis of lumbar region   Spinal stenosis    Time spent: 35 min    Saundra Gin  [facility] Pager [phone] If 7PM-7AM, please contact night-coverage at [email or web address], password [facility] 12/05/2013, 10:06 AM  LOS: 3 days

## 2013-12-05 NOTE — Progress Notes (Deleted)
Pt agreed to receive pain medication. Dr. Sravya Grissom Snipes called in response to text. Ordered EKG. Ok to give morphine for pain as ordered. Pt c/o itchiness, and wanting to get OOB. Will provide skin care during bath. Continue to monitor. Manya Silvas, RN

## 2013-12-05 NOTE — Progress Notes (Signed)
Spoke with Joelene Millin in main pharmacy about heparin being stopped for an hour (1500-1600) d/t IV infiltration. Heparin resumed at previous rate. No further adjustments needed. Continue to monitor. Manya Silvas, RN

## 2013-12-06 LAB — GLUCOSE, CAPILLARY: Glucose-Capillary: 118 mg/dL — ABNORMAL HIGH (ref 70–99)

## 2013-12-06 LAB — CBC
HCT: 39.3 % (ref 39.0–52.0)
Hemoglobin: 12.9 g/dL — ABNORMAL LOW (ref 13.0–17.0)
MCH: 29.3 pg (ref 26.0–34.0)
MCHC: 32.8 g/dL (ref 30.0–36.0)
MCV: 89.1 fL (ref 78.0–100.0)
Platelets: 217 10*3/uL (ref 150–400)
RBC: 4.41 MIL/uL (ref 4.22–5.81)
RDW: 16.1 % — ABNORMAL HIGH (ref 11.5–15.5)
WBC: 7.4 10*3/uL (ref 4.0–10.5)

## 2013-12-06 LAB — PROTIME-INR
INR: 1.37 (ref 0.00–1.49)
Prothrombin Time: 16.5 seconds — ABNORMAL HIGH (ref 11.6–15.2)

## 2013-12-06 LAB — HEPARIN LEVEL (UNFRACTIONATED): Heparin Unfractionated: 0.4 IU/mL (ref 0.30–0.70)

## 2013-12-06 MED ORDER — WARFARIN SODIUM 7.5 MG PO TABS
15.0000 mg | ORAL_TABLET | Freq: Once | ORAL | Status: AC
  Administered 2013-12-06: 15 mg via ORAL
  Filled 2013-12-06 (×2): qty 2

## 2013-12-06 NOTE — Progress Notes (Signed)
[place] for Heparin / Coumadin Indication: DVT/PE  Allergies  Allergen Reactions  . Lisinopril Other (See Comments)    cough    Patient Measurements: Height: 5\' 10"  (177.8 cm) Weight: 362 lb 12.8 oz (164.565 kg) IBW/kg (Calculated) : 73  Vital Signs: Temp: 98.4 F (36.9 C) (01/11 1044) Temp src: Oral (01/11 1044) BP: 119/73 mmHg (01/11 1044) Pulse Rate: 74 (01/11 1044)  Labs:  Recent Labs  12/04/13 0259 12/04/13 1155 12/04/13 2040 12/05/13 0418 12/06/13 0435  HGB 13.1  --   --  12.6* 12.9*  HCT 40.5  --   --  39.6 39.3  PLT 215  --   --  208 217  LABPROT  --  13.7  --  14.5 16.5*  INR  --  1.07  --  1.15 1.37  HEPARINUNFRC 0.28* 0.19* 0.56 0.47 0.40    Estimated Creatinine Clearance: 178.9 ml/min (by C-G formula based on Cr of 0.75).  Assessment: 47 yo male with h/o DVT/PE for heparin / Coumadin Heparin now therapeutic. (Coumadin PTA dose 10mg  daily except 5mg  on Mon / Sat PTA)  01/09 HL = 0.19 01/10 HL 2040 = 0.56 01/10 HL 0418 = 0.47 12/05/2013 3:43 PM RN called. IV with heparin infusion has infiltrated. Heparin was off ~ 1 hour before it was restarted. Did not adjust rate. Next level with AM labs. kbh  01/11 HL 0.40  01/10 INR = 1.15 after 2 days of 10mg  and 12.5 mg  01/11 INR = 1.37   Goal of Therapy:  Heparin level 0.3-0.7 INR 2-3 Monitor platelets by anticoagulation protocol: Yes   Plan:  Continue Heparin at 2750 units/hr Increased coumadin dose to 15 mg yesterday, starting to see larger response. Continue Coumadin 15mg  x 1 tonight  Daily HL,CBC, INR  Monitor for s/s bleeding  Ollen Gross B. Leitha Schuller, PharmD Clinical Pharmacist - Resident Pager: [phone] 12/06/2013 12:55 PM

## 2013-12-06 NOTE — Progress Notes (Signed)
Pt requests to keep 4 side rails up at all times. Pt provided education on risks and safety issues of having all four side rails up. Will continue to monitor. Velora Mediate

## 2013-12-06 NOTE — Progress Notes (Signed)
[facility] PROGRESS NOTE  Nicolas [name] RSW:[id number] DOB: Nov 27, 1965 DOA: 12/02/2013 PCP: Ellsworth Lennox, MD  Assessment/Plan: lumbar stenosis with gradual loss of motor power and sensations in his lower extremities for the last few months, come in for evaluation.  MRI with multiple issues- Dr. Saintclair Halsted does not recommend surgery -made appointment with Dr. 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 Off for the last 4 days  -heparin/lovenox bridge to coumadin  History of hypertension    Hyperglycemia  Morbid obesity   Code Status: full Family Communication: patient Disposition Plan: admit   Consultants:  NS  Procedures:    Antibiotics:    HPI/Subjective: Feels well No pain  Objective: Filed Vitals:   12/06/13 0611  BP: 137/75  Pulse: 74  Temp: 98.2 F (36.8 C)  Resp: 17    Intake/Output Summary (Last 24 hours) at 12/06/13 0845 Last data filed at 12/05/13 1832  Gross per 24 hour  Intake    960 ml  Output      0 ml  Net    960 ml   Filed Weights   12/03/13 2241 12/04/13 2205 12/05/13 2224  Weight: 164.247 kg (362 lb 1.6 oz) 164.157 kg (361 lb 14.4 oz) 164.565 kg (362 lb 12.8 oz)    Exam:   General:  A+Ox3,NAD  Cardiovascular: rrr  Respiratory: clear, decreased due to body habitus  Abdomen: obese, +BS  Musculoskeletal: decreased sensation in b/l LE, moves left more than right  Data Reviewed: Basic Metabolic Panel:  Recent Labs Lab 12/02/13 1702 12/03/13 0609  NA 142 140  K 4.0 3.9  CL 102 102  CO2 28 22  GLUCOSE 97 120*  BUN 14 14  CREATININE 0.75 0.75  CALCIUM 9.7 9.0   Liver Function Tests:  Recent Labs Lab 12/03/13 0609  AST 27  ALT 28  ALKPHOS 61  BILITOT 0.9  PROT 7.4  ALBUMIN 3.0*   No results found for this basename: LIPASE, AMYLASE,  in the last 168 hours No results found for this basename: AMMONIA,  in the last 168 hours CBC:  Recent Labs Lab  12/02/13 1702 12/04/13 0259 12/05/13 0418 12/06/13 0435  WBC 7.9 7.4 6.7 7.4  NEUTROABS 5.4  --   --   --   HGB 14.4 13.1 12.6* 12.9*  HCT 44.2 40.5 39.6 39.3  MCV 89.3 89.4 89.0 89.1  PLT 254 215 208 217   Cardiac Enzymes: No results found for this basename: CKTOTAL, CKMB, CKMBINDEX, TROPONINI,  in the last 168 hours BNP (last 3 results) No results found for this basename: PROBNP,  in the last 8760 hours CBG:  Recent Labs Lab 12/02/13 2049 12/03/13 0741 12/05/13 1732  GLUCAP 128* 113* 105*    No results found for this or any previous visit (from the past 240 hour(s)).   Studies: No results found.  Scheduled Meds: . ammonium lactate  1 application Topical BID  . docusate sodium  100 mg Oral BID  . metoprolol succinate  50 mg Oral Daily  . sertraline  75 mg Oral Daily  . traZODone  100 mg Oral QHS  . Warfarin - Pharmacist Dosing Inpatient   Does not apply q1800   Continuous Infusions: . heparin 2,750 Units/hr (12/06/13 0511)    Active Problems:   Spinal stenosis of lumbar region   Spinal stenosis    Time spent: 25 min    Jerlyn Pain, [place] [facility] Pager [phone]  If 7PM-7AM, please contact night-coverage at [email or web address], password [facility] 12/06/2013, 8:45 AM  LOS: 4 days

## 2013-12-07 LAB — PROTIME-INR
INR: 1.63 — ABNORMAL HIGH (ref 0.00–1.49)
Prothrombin Time: 18.9 seconds — ABNORMAL HIGH (ref 11.6–15.2)

## 2013-12-07 LAB — CBC
HCT: 40.7 % (ref 39.0–52.0)
Hemoglobin: 12.8 g/dL — ABNORMAL LOW (ref 13.0–17.0)
MCH: 27.8 pg (ref 26.0–34.0)
MCHC: 31.4 g/dL (ref 30.0–36.0)
MCV: 88.3 fL (ref 78.0–100.0)
Platelets: 227 10*3/uL (ref 150–400)
RBC: 4.61 MIL/uL (ref 4.22–5.81)
RDW: 16.2 % — ABNORMAL HIGH (ref 11.5–15.5)
WBC: 6.1 10*3/uL (ref 4.0–10.5)

## 2013-12-07 LAB — HEPARIN LEVEL (UNFRACTIONATED): Heparin Unfractionated: 0.37 IU/mL (ref 0.30–0.70)

## 2013-12-07 LAB — GLUCOSE, CAPILLARY: Glucose-Capillary: 113 mg/dL — ABNORMAL HIGH (ref 70–99)

## 2013-12-07 MED ORDER — TORSEMIDE 20 MG PO TABS
20.0000 mg | ORAL_TABLET | Freq: Every day | ORAL | Status: DC
  Administered 2013-12-07 – 2013-12-09 (×3): 20 mg via ORAL
  Filled 2013-12-07 (×3): qty 1

## 2013-12-07 MED ORDER — WARFARIN SODIUM 7.5 MG PO TABS
15.0000 mg | ORAL_TABLET | Freq: Once | ORAL | Status: AC
  Administered 2013-12-07: 15 mg via ORAL
  Filled 2013-12-07: qty 2

## 2013-12-07 MED ORDER — POTASSIUM CHLORIDE CRYS ER 20 MEQ PO TBCR
20.0000 meq | EXTENDED_RELEASE_TABLET | Freq: Two times a day (BID) | ORAL | Status: DC
  Administered 2013-12-07 – 2013-12-09 (×5): 20 meq via ORAL
  Filled 2013-12-07 (×7): qty 1

## 2013-12-07 NOTE — Progress Notes (Signed)
[place] for Heparin / Coumadin Indication: DVT/PE  Allergies  Allergen Reactions  . Lisinopril Other (See Comments)    cough    Patient Measurements: Height: 5\' 10"  (177.8 cm) Weight: 362 lb 12.8 oz (164.565 kg) IBW/kg (Calculated) : 73  Vital Signs: Temp: 97.4 F (36.3 C) (01/12 0545) Temp src: Oral (01/12 0545) BP: 120/60 mmHg (01/12 0545) Pulse Rate: 70 (01/12 0545)  Labs:  Recent Labs  12/05/13 0418 12/06/13 0435 12/07/13 0603  HGB 12.6* 12.9* 12.8*  HCT 39.6 39.3 40.7  PLT 208 217 227  LABPROT 14.5 16.5* 18.9*  INR 1.15 1.37 1.63*  HEPARINUNFRC 0.47 0.40 0.37    Estimated Creatinine Clearance: 178.9 ml/min (by C-G formula based on Cr of 0.75).  Assessment: CC: numbness and paralysis x 2 months (started in legs and moved up to belly)  47yoM with chronic severe back pain (6 spinal surgeries in the past), chronic coumadin for DVT and hx PE with IVC filter, 4 month history of gradual onset bilateral legs paralysis.  Anticoagulation: Coumadin PTA for hx mult PE's + DVT (10mg  daily except 5mg  on Mon / Sat PTA). Heparin for bridge until no plans for surgery determined - pt not surgery candidate per Dr. Saintclair Halsted-- he wants a second opinion. Heparin level 0.37 and INR up to 1.63.  Infectious Disease: afebrile, WBC 6.1  Cardiovascular: HTN - VSS, metoprolol XL  Endocrinology: h/o Hyperglycemia, glucose ok 105, 118  Nephrology: Scr and lytes stable, CrCl > 100  Pulmonary: RA  Hematology / Oncology: CBC stable  PTA Medication Issues: Metformin PTA  Best Practices: Coumadin-->Heparin   Goal of Therapy:  Heparin level 0.3-0.7 INR 2-3 Monitor platelets by anticoagulation protocol: Yes   Plan:  Continue Heparin at 2750 units/hr Increased coumadin dose to 15 mg yesterday, starting to see larger response. Continue Coumadin 15mg  x 1 tonight  Daily HL,CBC, INR  Monitor for s/s bleeding   Yeny Schmoll S. Alford Highland, PharmD,  [facility] Clinical Staff Pharmacist Pager [phone]  12/07/2013 8:31 AM

## 2013-12-07 NOTE — Evaluation (Signed)
Occupational Therapy Evaluation Patient Details Name: Nicolas [name] MRN: [id number] DOB: 1965/12/21 Today's Date: 12/07/2013 Time: 8250-0370 OT Time Calculation (min): 31 min  OT Assessment / Plan / Recommendation History of present illness Nicolas [name]  is a 48 y.o. male, with past medical history significant for spinal stenosis diagnosed in 2004 who fell from his old wound September 1 and started complaining of numbness in the lower extremities, his symptoms got worse and he became incontinent of bladder and bowel. Lately he discussed the case with his physician Ellsworth Lennox home advised him to get admitted to for a CT or an MRI of the spine, since he weighs around 400 pounds   Clinical Impression   Pt presents with no sensation or movement in his lower body.  He demonstrates poor sitting balance and requires +3 for sliding board transfer to w/c.  Pt requires assist for bed level bathing, dressing, and toileting activities. He will need SNF for post acute rehab.  Will defer OT to SNF.    OT Assessment  All further OT needs can be met in the next venue of care    Follow Up Recommendations  SNF    Barriers to Discharge      Equipment Recommendations  None recommended by OT    Recommendations for Other Services    Frequency       Precautions / Restrictions Precautions Precautions: Fall Precaution Comments: bowel and bladder incontinence   Pertinent Vitals/Pain VSS, no pain     ADL  Eating/Feeding: Independent Where Assessed - Eating/Feeding: Bed level Grooming: Wash/dry hands;Wash/dry face;Teeth care;Set up Where Assessed - Grooming: Supported sitting Upper Body Bathing: +1 Total assistance Where Assessed - Upper Body Bathing: Unsupported sitting Lower Body Bathing: +1 Total assistance Where Assessed - Lower Body Bathing: Supine, head of bed up;Rolling right and/or left Upper Body Dressing: Minimal assistance Where Assessed - Upper Body Dressing: Supported  sitting Lower Body Dressing: +1 Total assistance Where Assessed - Lower Body Dressing: Supine, head of bed up;Rolling right and/or left Toilet Transfer: Other (comment) (+3 with transfer board bed to chair) Toilet Transfer Method: Transfer board Toileting - Clothing Manipulation and Hygiene: +1 Total assistance Equipment Used: Gait belt (transfer board) Transfers/Ambulation Related to ADLs: +3 with transfer board to chair, pt 20% ADL Comments: Pt has no functional use or sensation in lower body.  b/b incontinent.    OT Diagnosis: Generalized weakness;Paresis  OT Problem List: Decreased strength;Decreased range of motion;Decreased activity tolerance;Decreased knowledge of use of DME or AE;Impaired sensation;Impaired tone;Obesity;Pain;Increased edema OT Treatment Interventions:     OT Goals(Current goals can be found in the care plan section) Acute Rehab OT Goals Patient Stated Goal: To get second opinion  Visit Information  Last OT Received On: 12/07/13 Assistance Needed: +3 or more (2 for sitting EOB) PT/OT/SLP Co-Evaluation/Treatment: Yes Reason for Co-Treatment: For patient/therapist safety PT goals addressed during session: Balance;Mobility/safety with mobility;Proper use of DME;Strengthening/ROM OT goals addressed during session: Proper use of Adaptive equipment and DME History of Present Illness: Nicolas [name]  is a 48 y.o. male, with past medical history significant for spinal stenosis diagnosed in 2004 who fell from his old wound September 1 and started complaining of numbness in the lower extremities, his symptoms got worse and he became incontinent of bladder and bowel. Lately he discussed the case with his physician Ellsworth Lennox home advised him to get admitted to for a CT or an MRI of the spine, since he weighs around 400 pounds  Prior [place] expects to be discharged to:: Skilled nursing facility Living Arrangements:  Children Available Help at Discharge: Family Type of Home: House Home Access: Ramped entrance [place]: One [place]: Transport planner;Bedside commode;Hospital bed;Walker - 2 wheels;Wheelchair - Brewing technologist;Other (comment) Additional Comments: daughter lilves with pt works days and school at night; brother staying temporarily Prior Function Level of Independence: Needs assistance Gait / Transfers Assistance Needed: within less than a month has lost ability to stand pivot with walker to hover-round and to toilet; now just using hoyer lift, is incontinent and gets around in hoverround ADL's / Homemaking Assistance Needed: max assist with bathing and dressing at bedlevel, could self feed and perform some grooming tasks. Communication Communication: No difficulties Dominant Hand: Right         Vision/Perception Vision - History Baseline Vision: No visual deficits   Cognition  Cognition Arousal/Alertness: Awake/alert Behavior During Therapy: WFL for tasks assessed/performed Overall Cognitive Status: Within Functional Limits for tasks assessed    Extremity/Trunk Assessment Upper Extremity Assessment Upper Extremity Assessment: Overall WFL for tasks assessed Lower Extremity Assessment Lower Extremity Assessment: Defer to PT evaluation Cervical / Trunk Assessment Cervical / Trunk Assessment: Normal     Mobility Bed Mobility Overal bed mobility: Needs Assistance Bed Mobility: Supine to Sit Supine to sit: Mod assist;HOB elevated General bed mobility comments: with rails, assist for LE's  Transfers Overall transfer level: Needs assistance Transfers: Lateral/Scoot Transfers  Lateral/Scoot Transfers: With slide board;Max assist;+2 physical assistance (+3) General transfer comment: pt assisted approx 20-30% using bed rails and reaching for armrest, cues for anterior lean to unweight hips and allow improved sliding     Exercise     Balance Balance Overall  balance assessment: Needs assistance Sitting-balance support: Feet supported;No upper extremity supported Sitting balance-Leahy Scale: Fair   End of Session OT - End of Session Equipment Utilized During Treatment: Gait belt Activity Tolerance: Patient tolerated treatment well Patient left: in chair;with call bell/phone within reach Nurse Communication: Need for lift equipment;Mobility status  GO     Malka So 12/07/2013, 12:47 PM [phone]

## 2013-12-07 NOTE — Clinical Social Work Note (Signed)
Patient assessed (full assessment to follow) and faxed out to facilities in [facility]. His preference is [facility]. Facility contacted and will review clinicals. Clinicals faxed to [facility] - attention [place].  Ericca Labra Givens, MSW, LCSW [phone]

## 2013-12-07 NOTE — Progress Notes (Signed)
Physical Therapy Treatment Patient Details Name: Nicolas [name] MRN: [id number] DOB: Aug 20, 1966 Today's Date: 12/07/2013 Time: 7915-0569 PT Time Calculation (min): 31 min  PT Assessment / Plan / Recommendation  History of Present Illness Nicolas [name]  is a 48 y.o. male, with past medical history significant for spinal stenosis diagnosed in 2004 who fell from his old wound September 1 and started complaining of numbness in the lower extremities, his symptoms got worse and he became incontinent of bladder and bowel. Lately he discussed the case with his physician Ellsworth Lennox home advised him to get admitted to for a CT or an MRI of the spine, since he weighs around 400 pounds   PT Comments   Patient tolerating out of bed well and able to use the sliding board, but needs 3 people to assist for safety.  Will need continued skilled care in SNF rehab setting to maximize independence due to pt lives alone.  Follow Up Recommendations  SNF     Does the patient have the potential to tolerate intense rehabilitation   Yes  Barriers to Discharge  Decreased caregiver support      Equipment Recommendations  None recommended by PT    Recommendations for Other Services  None  Frequency Min 3X/week   Progress towards PT Goals Progress towards PT goals: Progressing toward goals  Plan Current plan remains appropriate    Precautions / Restrictions Precautions Precautions: Fall Precaution Comments: bowel and bladder incontinence   Pertinent Vitals/Pain No pain compliants    Mobility  Bed Mobility Overal bed mobility: Needs Assistance Bed Mobility: Supine to Sit Supine to sit: Mod assist;HOB elevated General bed mobility comments: with rails, assist for LE's  Transfers Overall transfer level: Needs assistance Transfers: Lateral/Scoot Transfers  Lateral/Scoot Transfers: With slide board;Max assist;+2 physical assistance (+3) General transfer comment: pt assisted approx 20-30% using bed  rails and reaching for armrest, cues for anterior lean to unweight hips and allow improved sliding    Exercises General Exercises - Lower Extremity Ankle Circles/Pumps: PROM;Both;10 reps;Seated Heel Slides: Both;PROM;10 reps;Seated Hip ABduction/ADduction: PROM;Both;10 reps;Seated     PT Goals (current goals can now be found in the care plan section) Acute Rehab PT Goals Patient Stated Goal: To get second opinion  Visit Information  Last PT Received On: 12/07/13 Assistance Needed: +3 or more (2 for sitting EOB) PT/OT/SLP Co-Evaluation/Treatment: Yes Reason for Co-Treatment: For patient/therapist safety PT goals addressed during session: Balance;Mobility/safety with mobility;Proper use of DME;Strengthening/ROM OT goals addressed during session: Proper use of Adaptive equipment and DME History of Present Illness: Nicolas [name]  is a 48 y.o. male, with past medical history significant for spinal stenosis diagnosed in 2004 who fell from his old wound September 1 and started complaining of numbness in the lower extremities, his symptoms got worse and he became incontinent of bladder and bowel. Lately he discussed the case with his physician Ellsworth Lennox home advised him to get admitted to for a CT or an MRI of the spine, since he weighs around 400 pounds    Subjective Data  Patient Stated Goal: To get second opinion   Cognition  Cognition Arousal/Alertness: Awake/alert Behavior During Therapy: WFL for tasks assessed/performed Overall Cognitive Status: Within Functional Limits for tasks assessed    Balance  Balance Overall balance assessment: Needs assistance Sitting-balance support: No upper extremity supported;Feet unsupported Sitting balance-Leahy Scale: Fair Dynamic Sitting - Comments: maintains sitting unsupported independent, during scooting efforts falls right and anterior with mod/max assist to recover  End of  Session PT - End of Session Equipment Utilized During  Treatment: Gait belt Activity Tolerance: Patient tolerated treatment well Patient left: in chair;with call bell/phone within reach;with family/visitor present Nurse Communication: Need for lift equipment;Mobility status   GP     Sheza Strickland,CYNDI 12/07/2013, 12:51 PM Magda Kiel, [place] 12/07/2013

## 2013-12-07 NOTE — Discharge Instructions (Signed)

## 2013-12-07 NOTE — Progress Notes (Signed)
[facility] PROGRESS NOTE  Nicolas [name] CBJ:[id number] DOB: 12/23/1965 DOA: 12/02/2013 PCP: Ellsworth Lennox, MD  Assessment/Plan: lumbar stenosis with gradual loss of motor power and sensations in his lower extremities for the last few months, come in for evaluation.  MRI with multiple issues- Dr. Saintclair Halsted does not recommend surgery -made appointment with Dr. Cathren Laine at [facility]- Jan 16WV 11 AM -PT/OT-  SNF  History of bilateral DVTs and PE on Coumadin. Off for  4 days prior to admission -heparin/lovenox bridge to coumadin  History of hypertension    Hyperglycemia  Morbid obesity   Code Status: full Family Communication: patient Disposition Plan: SNF   Consultants:  NS  Procedures:    Antibiotics:    HPI/Subjective: Feels well Back pain controlled with medications  Objective: Filed Vitals:   12/07/13 0900  BP: 116/60  Pulse: 78  Temp: 97.8 F (36.6 C)  Resp: 20    Intake/Output Summary (Last 24 hours) at 12/07/13 1018 Last data filed at 12/07/13 0900  Gross per 24 hour  Intake    360 ml  Output    600 ml  Net   -240 ml   Filed Weights   12/04/13 2205 12/05/13 2224 12/06/13 2009  Weight: 164.157 kg (361 lb 14.4 oz) 164.565 kg (362 lb 12.8 oz) 164.565 kg (362 lb 12.8 oz)    Exam:   General:  A+Ox3,NAD  Cardiovascular: rrr  Respiratory: clear, decreased due to body habitus  Abdomen: obese, +BS  Musculoskeletal: decreased sensation in b/l LE, moves left more than right  Data Reviewed: Basic Metabolic Panel:  Recent Labs Lab 12/02/13 1702 12/03/13 0609  NA 142 140  K 4.0 3.9  CL 102 102  CO2 28 22  GLUCOSE 97 120*  BUN 14 14  CREATININE 0.75 0.75  CALCIUM 9.7 9.0   Liver Function Tests:  Recent Labs Lab 12/03/13 0609  AST 27  ALT 28  ALKPHOS 61  BILITOT 0.9  PROT 7.4  ALBUMIN 3.0*   No results found for this basename: LIPASE, AMYLASE,  in the last 168 hours No results found for this basename: AMMONIA,  in the last  168 hours CBC:  Recent Labs Lab 12/02/13 1702 12/04/13 0259 12/05/13 0418 12/06/13 0435 12/07/13 0603  WBC 7.9 7.4 6.7 7.4 6.1  NEUTROABS 5.4  --   --   --   --   HGB 14.4 13.1 12.6* 12.9* 12.8*  HCT 44.2 40.5 39.6 39.3 40.7  MCV 89.3 89.4 89.0 89.1 88.3  PLT 254 215 208 217 227   Cardiac Enzymes: No results found for this basename: CKTOTAL, CKMB, CKMBINDEX, TROPONINI,  in the last 168 hours BNP (last 3 results) No results found for this basename: PROBNP,  in the last 8760 hours CBG:  Recent Labs Lab 12/02/13 2049 12/03/13 0741 12/05/13 1732 12/06/13 0808  GLUCAP 128* 113* 105* 118*    No results found for this or any previous visit (from the past 240 hour(s)).   Studies: No results found.  Scheduled Meds: . ammonium lactate  1 application Topical BID  . docusate sodium  100 mg Oral BID  . metoprolol succinate  50 mg Oral Daily  . sertraline  75 mg Oral Daily  . traZODone  100 mg Oral QHS  . warfarin  15 mg Oral ONCE-1800  . Warfarin - Pharmacist Dosing Inpatient   Does not apply q1800   Continuous Infusions: . heparin 2,750 Units/hr (12/07/13 3710)    Active Problems:   Spinal stenosis of lumbar  region   Spinal stenosis    Time spent: 25 min    Eulogio Bear  [facility] Pager [phone] If 7PM-7AM, please contact night-coverage at [email or web address], password [facility] 12/07/2013, 10:18 AM  LOS: 5 days

## 2013-12-08 LAB — CBC
HCT: 42.3 % (ref 39.0–52.0)
Hemoglobin: 13.6 g/dL (ref 13.0–17.0)
MCH: 28.3 pg (ref 26.0–34.0)
MCHC: 32.2 g/dL (ref 30.0–36.0)
MCV: 88.1 fL (ref 78.0–100.0)
Platelets: 231 10*3/uL (ref 150–400)
RBC: 4.8 MIL/uL (ref 4.22–5.81)
RDW: 16.2 % — ABNORMAL HIGH (ref 11.5–15.5)
WBC: 7.1 10*3/uL (ref 4.0–10.5)

## 2013-12-08 LAB — HEPARIN LEVEL (UNFRACTIONATED): Heparin Unfractionated: 0.45 IU/mL (ref 0.30–0.70)

## 2013-12-08 LAB — PROTIME-INR
INR: 2.18 — ABNORMAL HIGH (ref 0.00–1.49)
Prothrombin Time: 23.6 seconds — ABNORMAL HIGH (ref 11.6–15.2)

## 2013-12-08 MED ORDER — OXYCODONE HCL 5 MG PO TABS: 5.0000 mg | ORAL_TABLET | ORAL | Status: DC | PRN

## 2013-12-08 MED ORDER — DIAZEPAM 10 MG PO TABS: ORAL_TABLET | ORAL | Status: DC

## 2013-12-08 MED ORDER — WARFARIN SODIUM 10 MG PO TABS
10.0000 mg | ORAL_TABLET | Freq: Once | ORAL | Status: AC
  Administered 2013-12-08: 10 mg via ORAL
  Filled 2013-12-08: qty 1

## 2013-12-08 NOTE — Clinical Social Work Note (Signed)
Patient medically stable for discharge. [facility] chosen and facility submitted clinicals to [facility] for pre-authorization this morning. CSW advised by Pleas Koch liasion for [facility] that she cannot assist with getting authorization for this patient as he has [place].  CSW will follow-up with facility on 1/14 regarding [facility] authorization.  Kaylana Fenstermacher Givens, MSW, LCSW [phone]

## 2013-12-08 NOTE — Progress Notes (Signed)
Patient ID: Nicolas [name], male   DOB: [birth year], 48 y.o.   MRN: [id number] Late entry saw the patient yesterday afternoon no change in neurologic exam remains complete T8 paraplegic with no motor no sensory below T8  No role for surgical decompression in the setting of complete paraplegia for this extended duration bordering on 3 months

## 2013-12-08 NOTE — Discharge Summary (Addendum)
Physician Discharge Summary  Nicolas [name] RXV:[id number] DOB: 1966-10-18 DOA: 12/02/2013  PCP: Nicolas Lennox, MD  Admit date: 12/02/2013 Discharge date: 12/08/2013  Time spent: 35 minutes  Recommendations for Outpatient Follow-up:  1. To SNF 2. PT/INR Monday  Discharge Diagnoses:  Active Problems:   Spinal stenosis of lumbar region   Spinal stenosis   Discharge Condition: stable  Diet recommendation: cardiac/diabetic  Filed Weights   12/05/13 2224 12/06/13 2009 12/07/13 2243  Weight: 164.565 kg (362 lb 12.8 oz) 164.565 kg (362 lb 12.8 oz) 165.291 kg (364 lb 6.4 oz)    History of present illness:  Nicolas [name] is a 48 y.o. male, with past medical history significant for spinal stenosis diagnosed in 2004 who fell from his old wound September 1 and started complaining of numbness in the lower extremities, his symptoms got worse and he became incontinent of bladder and bowel. Lately he discussed the case with his physician Nicolas [name] home advised him to get admitted to for a CT or an MRI of the spine, since he weighs around 400 pounds. The patient stopped his Coumadin 4 days ago and came over for further workup   Hospital Course:  lumbar stenosis with gradual loss of motor power and sensations in his lower extremities for the last few months, come in for evaluation.  MRI with multiple issues- Dr. Saintclair [name] does not recommend surgery  -made appointment with Dr. 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 Off for 4 days prior to admission  -coumadin  History of hypertension   Hyperglycemia   Morbid obesity   Procedures:  MRI  Consultations:  neurosurgery  Discharge Exam: Filed Vitals:   12/08/13 0810  BP: 105/70  Pulse: 87  Temp: 98 F (36.7 C)  Resp: 20    General: A+Ox3, NAD Cardiovascular: rrr Respiratory: clear, decreased  Discharge Instructions  Discharge Orders    Future Orders Complete By Expires   Diet - low sodium heart healthy  As directed    Diet Carb Modified  As directed    Increase activity slowly  As directed        Medication List    STOP taking these medications       oxyCODONE-acetaminophen 5-325 MG per tablet  Commonly known as:  PERCOCET/ROXICET      TAKE these medications       ammonium lactate 12 % cream  Commonly known as:  AMLACTIN  Apply 1 g topically 2 (two) times daily.     diazepam 10 MG tablet  Commonly known as:  VALIUM  Take 1 tablet by mouth every 8 hours if needed for anxiety     docusate sodium 100 MG capsule  Commonly known as:  COLACE  Take 1 capsule (100 mg total) by mouth every 12 (twelve) hours.     ketoconazole 2 % shampoo  Commonly known as:  NIZORAL  Apply 1 application topically daily.     metFORMIN 500 MG tablet  Commonly known as:  GLUCOPHAGE  Take 500 mg by mouth every evening.     metoprolol succinate 50 MG 24 hr tablet  Commonly known as:  TOPROL-XL  Take 1 tablet (50 mg total) by mouth daily.     oxyCODONE 5 MG immediate release tablet  Commonly known as:  Oxy IR/ROXICODONE  Take 1 tablet (5 mg total) by mouth every 4 (four) hours as needed for moderate pain.  potassium chloride SA 20 MEQ tablet  Commonly known as:  KLOR-CON M20  Take 1 tablet (20 mEq total) by mouth 2 (two) times daily.     sertraline 50 MG tablet  Commonly known as:  ZOLOFT  Take 75 mg by mouth daily.     torsemide 20 MG tablet  Commonly known as:  DEMADEX  Take 20 mg by mouth daily.     traZODone 100 MG tablet  Commonly known as:  DESYREL  Take 100 mg by mouth at bedtime.     warfarin 10 MG tablet  Commonly known as:  COUMADIN  - Take 5-10 mg by mouth daily. Takes 10mg  (1 tablet)  on Tuesday, Wednesday, Thursday, Friday,  & Sunday   - Takes 5mg  (0.5 tablet) on Saturday and Monday       Allergies  Allergen Reactions  . Lisinopril Other (See Comments)    cough      The results of  significant diagnostics from this hospitalization (including imaging, microbiology, ancillary and laboratory) are listed below for reference.    Significant Diagnostic Studies: Dg Thoracic Spine 2 View  11/10/2013   CLINICAL DATA:  Fall  EXAM: THORACIC SPINE - 2 VIEW  COMPARISON:  11/26/ 0 9  FINDINGS: Three views of thoracic spine submitted. No acute fracture or subluxation. Mild degenerative changes. Alignment and vertebral height are preserved.  IMPRESSION: No acute fracture or subluxation.  Mild degenerative changes.   Electronically Signed   By: Lahoma Crocker M.D.   On: 11/10/2013 07:32   Mr Thoracic Spine W Wo Contrast  12/03/2013   CLINICAL DATA:  Bilateral lower extremity weakness  EXAM: MRI THORACIC SPINE WITHOUT AND WITH CONTRAST  TECHNIQUE: Multiplanar and multiecho pulse sequences of the thoracic spine were obtained without and with intravenous contrast.  CONTRAST:  49mL MULTIHANCE GADOBENATE DIMEGLUMINE 529 MG/ML IV SOLN  COMPARISON:  Thoracic spine radiographs 11/10/2013. CT chest 10/16/2003.  FINDINGS: There are extensive surgical changes involving the thoracic spine with multilevel decompressive laminectomies. Surgical changes are noted from T2-3 down to T6-7. There is also laminectomies from T8 down to T11. Small focus of cord signal abnormality due noted at T8-9 is likely myelomalacia.  The thoracic vertebral bodies are normally aligned. They demonstrate normal marrow signal.  T1-2:  No significant findings.  T2-3: Mild osteophytic ridging but no significant spinal stenosis. Moderate facet disease.  T3-4: Postoperative changes. No significant spinal stenosis. Moderate facet disease.  T4-5: Postoperative changes with decompressive laminectomy. No significant spinal stenosis.  T5-6:  Decompressive laminectomy.  No significant spinal stenosis.  T6-7: Decompressive laminectomy. There is advanced facet disease with mild impression on the right side of the thecal sac. No significant spinal stenosis.   T7-8: Severe facet disease, greater on the right with mass effect on the thecal sac and spinal cord most notably just above the disc space level. There is approximately 50% canal compromise.  T7-8: Decompressive laminectomy. Severe facet disease with mild mass effect on the left side of the thecal sac but no significant spinal stenosis. Area of myelomalacia noted.  T9-10: Decompressive laminectomy.  No significant spinal stenosis.  T10-11: Decompressive laminectomy. Severe facet disease but no significant spinal stenosis.  T11-12: Facet disease and ligamentum flavum thickening with mild spinal stenosis.  T12-L1:  No significant findings.  IMPRESSION: 1. Multilevel surgical decompressions posteriorly. 2. Myelomalacia noted at T8-9. 3. Significant spinal stenosis at T7-8. 4. Mild spinal stenosis at T11-12.   Electronically Signed   By: Veneta Penton.D.  On: 12/03/2013 13:21   Ct Abdomen Pelvis W Contrast  11/27/2013   CLINICAL DATA:  Abdominal pain.  EXAM: CT ABDOMEN AND PELVIS WITH CONTRAST  TECHNIQUE: Multidetector CT imaging of the abdomen and pelvis was performed using the standard protocol following bolus administration of intravenous contrast.  CONTRAST:  115mL OMNIPAQUE IOHEXOL 300 MG/ML  SOLN  COMPARISON:  None.  FINDINGS: BODY WALL: Numerous subcutaneous venous collaterals related to IVC stenosis or occlusion.  LOWER CHEST: Small right pleural effusion. Mild basilar atelectasis or scarring.  ABDOMEN/PELVIS:  Liver: No focal abnormality.Diffuse fatty infiltration of the liver.  Biliary: No evidence of biliary obstruction or stone.  Pancreas: Unremarkable.  Spleen: Unremarkable.  Adrenals: Unremarkable.  Kidneys and ureters: No hydronephrosis or stone. 16 mm cyst in the lower pole left kidney.  Bladder: Unremarkable given decompressed state.  Reproductive: Unremarkable.  Bowel: No obstruction. Moderate volume of formed stool, predominantly in the mid and distal colon, without evidence of proximal  obstruction.  Retroperitoneum: No mass or adenopathy.  Peritoneum: No free fluid or gas.  Vascular: Infrarenal IVC filter. The filter and the cava is collapsed, with venous collaterals suggesting flow limiting stenosis. One of the medial to tines penetrates the wall of the neighboring infrarenal aorta.  OSSEOUS: Extensive facet osteoarthritis with posterior ligament ossification involving the lower thoracic and lumbar spine. There has been decompressive laminectomies from T8-T10. Bulky posterior ligament ossification and thickening continues to narrow the spinal canal, especially at the level of T7. Diffuse foraminal stenoses related to the same.  IMPRESSION: 1. No evidence of acute intra-abdominal disease. 2. 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 3. Spinal canal stenosis, most advanced at the level of T7, secondary to dorsal ligamentous ossification.   Electronically Signed   By: Jorje Guild M.D.   On: 11/27/2013 23:19   Dg Chest Portable 1 View  11/12/2013   CLINICAL DATA:  Cough.  EXAM: PORTABLE CHEST - 1 VIEW  COMPARISON:  10/21/2008  FINDINGS: Portable semi upright view of the chest demonstrates low lung volumes. Surgical plate in the lower cervical spine. Densities at the right lung base could be related to overlying soft tissues. Heart size is prominent but likely accentuated by the low lung volumes and technique.  IMPRESSION: Low lung volumes and cannot exclude right basilar densities.   Electronically Signed   By: Markus Daft M.D.   On: 11/12/2013 11:40    Microbiology: No results found for this or any previous visit (from the past 240 hour(s)).   Labs: Basic Metabolic Panel:  Recent Labs Lab 12/02/13 1702 12/03/13 0609  NA 142 140  K 4.0 3.9  CL 102 102  CO2 28 22  GLUCOSE 97 120*  BUN 14 14  CREATININE 0.75 0.75  CALCIUM 9.7 9.0   Liver Function Tests:  Recent Labs Lab 12/03/13 0609  AST 27  ALT 28  ALKPHOS 61  BILITOT  0.9  PROT 7.4  ALBUMIN 3.0*   No results found for this basename: LIPASE, AMYLASE,  in the last 168 hours No results found for this basename: AMMONIA,  in the last 168 hours CBC:  Recent Labs Lab 12/02/13 1702 12/04/13 0259 12/05/13 0418 12/06/13 0435 12/07/13 0603 12/08/13 0535  WBC 7.9 7.4 6.7 7.4 6.1 7.1  NEUTROABS 5.4  --   --   --   --   --   HGB 14.4 13.1 12.6* 12.9* 12.8* 13.6  HCT 44.2 40.5 39.6 39.3 40.7 42.3  MCV 89.3 89.4 89.0 89.1 88.3 88.1  PLT 254 215 208 217 227 231   Cardiac Enzymes: No results found for this basename: CKTOTAL, CKMB, CKMBINDEX, TROPONINI,  in the last 168 hours BNP: BNP (last 3 results) No results found for this basename: PROBNP,  in the last 8760 hours CBG:  Recent Labs Lab 12/02/13 2049 12/03/13 0741 12/05/13 1732 12/06/13 0808 12/07/13 2247  GLUCAP 128* 113* 105* 118* 113*       Signed:  Eliseo Squires, Frankye Schwegel  [facility] 12/08/2013, 9:55 AM

## 2013-12-08 NOTE — Progress Notes (Signed)
[place] for Heparin / Coumadin Indication: DVT/PE  Allergies  Allergen Reactions  . Lisinopril Other (See Comments)    cough    Patient Measurements: Height: 5\' 10"  (177.8 cm) Weight: 364 lb 6.4 oz (165.291 kg) IBW/kg (Calculated) : 73  Vital Signs: Temp: 98 F (36.7 C) (01/13 0810) Temp src: Oral (01/13 0810) BP: 105/70 mmHg (01/13 0810) Pulse Rate: 87 (01/13 0810)  Labs:  Recent Labs  12/06/13 0435 12/07/13 0603 12/08/13 0535  HGB 12.9* 12.8* 13.6  HCT 39.3 40.7 42.3  PLT 217 227 231  LABPROT 16.5* 18.9* 23.6*  INR 1.37 1.63* 2.18*  HEPARINUNFRC 0.40 0.37 0.45    Estimated Creatinine Clearance: 179.4 ml/min (by C-G formula based on Cr of 0.75).  Assessment: CC: numbness and paralysis x 2 months (started in legs and moved up to belly)  47yoM with chronic severe back pain (6 spinal surgeries in the past), needs chronic coumadin for DVT and hx PE with IVC filter, 4 month history of gradual onset bilateral legs paralysis.  Anticoagulation: Coumadin PTA for hx mult PE's + DVT (10mg  daily except 5mg  on Mon / Sat PTA). Heparin for bridge until no plans for surgery determined - pt not surgery candidate per Dr. Saintclair Halsted-- he wants a second opinion. Heparin level 0.45 and INR up to 2.18  Infectious Disease: afebrile, WBC 7.1  Cardiovascular: HTN - VSS, metoprolol XL  Endocrinology: h/o Hyperglycemia, glucose ok 105, 118  Nephrology: Scr and lytes stable, CrCl > 100  Pulmonary: RA  Hematology / Oncology: CBC stable  PTA Medication Issues: Metformin PTA  Best Practices: Coumadin-->Heparin   Goal of Therapy:  Heparin level 0.3-0.7 INR 2-3 Monitor platelets by anticoagulation protocol: Yes   Plan:  D/c IV heparin Recommend Coumadin 10mg  daily adjusted PRN at transferred facility.    Montrae Braithwaite S. Alford Highland, PharmD, [facility] Clinical Staff Pharmacist Pager [phone]  12/08/2013 9:29 AM

## 2013-12-09 DIAGNOSIS — Z7901 Long term (current) use of anticoagulants: Secondary | ICD-10-CM

## 2013-12-09 LAB — PROTIME-INR
INR: 2.22 — ABNORMAL HIGH (ref 0.00–1.49)
Prothrombin Time: 23.9 seconds — ABNORMAL HIGH (ref 11.6–15.2)

## 2013-12-09 MED ORDER — WARFARIN SODIUM 10 MG PO TABS
10.0000 mg | ORAL_TABLET | Freq: Every day | ORAL | Status: DC
  Filled 2013-12-09 (×2): qty 1

## 2013-12-09 MED ORDER — DIAZEPAM 10 MG PO TABS: ORAL_TABLET | ORAL | Status: DC

## 2013-12-09 MED ORDER — OXYCODONE HCL 5 MG PO TABS: 5.0000 mg | ORAL_TABLET | ORAL | Status: DC | PRN

## 2013-12-09 NOTE — Progress Notes (Signed)
Physical Therapy Treatment Patient Details Name: Nicolas [name] MRN: [id number] DOB: 03-27-1966 Today's Date: 12/09/2013 Time: 8416-6063 PT Time Calculation (min): 31 min  PT Assessment / Plan / Recommendation  History of Present Illness Nicolas [name]  is a 48 y.o. male, with past medical history significant for spinal stenosis diagnosed in 2004 who fell from his old wound September 1 and started complaining of numbness in the lower extremities, his symptoms got worse and he became incontinent of bladder and bowel. Lately he discussed the case with his physician Ellsworth Lennox home advised him to get admitted to for a CT or an MRI of the spine, since he weighs around 400 pounds   PT Comments   Treatment focus on sitting balance and anterior weight shifts to improve independence with transfers.  Did move to chair on sliding board easier than last session.  Will need SNF rehab at d/c.  Follow Up Recommendations  SNF     Does the patient have the potential to tolerate intense rehabilitation   N/a  Barriers to Discharge  None      Equipment Recommendations  None recommended by PT    Recommendations for Other Services  None  Frequency Min 3X/week   Progress towards PT Goals Progress towards PT goals: Progressing toward goals  Plan Current plan remains appropriate    Precautions / Restrictions Precautions Precautions: Fall Precaution Comments: bowel and bladder incontinence   Pertinent Vitals/Pain No pain complaints    Mobility  Bed Mobility Rolling: Supervision Supine to sit: Mod assist;HOB elevated General bed mobility comments: with rails, assist for LE's  Transfers  Lateral/Scoot Transfers: With slide board;Max assist;+2 physical assistance General transfer comment: Assisted to lean forward to unweight hips and extra person to help hold chair    Exercises General Exercises - Lower Extremity Ankle Circles/Pumps: PROM;10 reps;Both;Supine Heel Slides: PROM;Both;10  reps;Supine Hip ABduction/ADduction: PROM;Both;Other (comment) (stretching into abduction/ER for ring sitting with bilateral LE's) Straight Leg Raises: PROM;Both;5 reps;Supine     PT Goals (current goals can now be found in the care plan section)    Visit Information  Last PT Received On: 12/09/13 Assistance Needed: +3 or more History of Present Illness: Nicolas [name]  is a 48 y.o. male, with past medical history significant for spinal stenosis diagnosed in 2004 who fell from his old wound September 1 and started complaining of numbness in the lower extremities, his symptoms got worse and he became incontinent of bladder and bowel. Lately he discussed the case with his physician Ellsworth Lennox home advised him to get admitted to for a CT or an MRI of the spine, since he weighs around 400 pounds    Subjective Data      Cognition  Cognition Arousal/Alertness: Awake/alert Behavior During Therapy: WFL for tasks assessed/performed Overall Cognitive Status: Within Functional Limits for tasks assessed    Balance  Balance Overall balance assessment: Needs assistance Sitting-balance support: Bilateral upper extremity supported Sitting balance-Leahy Scale: Fair Sitting balance - Comments: practiced ring sitting in bed pt holding rails on both sides and needed +2 to come up to sit; sitting edge of bed leaned forward hands on chair in front, then down with elbows to knees for increased anterior weight shift; educated on balance points as well as on unweighting for improved transfers  Postural control: Right lateral lean;Left lateral lean  End of Session PT - End of Session Activity Tolerance: Patient tolerated treatment well Patient left: in chair;with call bell/phone within reach;with family/visitor present Nurse Communication: Need  for lift equipment;Mobility status   GP     WYNN,CYNDI 12/09/2013, 1:10 PM [place] [place] 12/09/2013

## 2013-12-09 NOTE — Progress Notes (Signed)
Patient seen and examined. To be discharged to SNF today. No changes to DC diagnoses or medications required.  Domingo Mend, MD [facility] Pager: [phone]

## 2013-12-09 NOTE — Clinical Social Work Psychosocial (Signed)
Clinical Social Work Department BRIEF PSYCHOSOCIAL ASSESSMENT 12/09/2013  Patient:  Nicolas [name], Nicolas [name]     Account Number:  [id number]     Admit date:  12/02/2013  Clinical Social Worker:  Frederico Hamman  Date/Time:  12/09/2013 03:08 AM  Referred by:  Physician  Date Referred:  12/05/2013 Referred for  SNF Placement   Other Referral:   Interview type:  Patient Other interview type:   CSW talked with patient and wife who was at the bedside    PSYCHOSOCIAL DATA Living Status:  WIFE Admitted from facility:   Level of care:   Primary support name:  [name] Siler Primary support relationship to patient:  SPOUSE Degree of support available:   Wife concerned that patient gets the care he needs and the facility of his choosing for rehab.    CURRENT CONCERNS Current Concerns  Post-Acute Placement   Other Concerns:    SOCIAL WORK ASSESSMENT / PLAN On 12/07/13, CSW talked with patient and wife at the bedside regarding discharge planning and recommendation of SNF for short-term rehab. Patient and wife in agreement and facility preference is [facility] in [place]. CSW explained SNF search process, along with [facility] authorization process and patient and wife voiced understanding as they had been through this before.   Assessment/plan status:  Psychosocial Support/Ongoing Assessment of Needs Other assessment/ plan:   Information/referral to community resources:    PATIENT'S/FAMILY'S RESPONSE TO PLAN OF CARE: Patient's face and neck were being cleaned by his wife during my visit. Patient very pleasant and agreeable to SNF placement. Patient explained that he did not want a facility in [facility] as he had been to a SNF in [place] and did not like it. Wife pleasant and also in agreement with [facility] rehab.

## 2013-12-09 NOTE — Progress Notes (Signed)
[place] for Heparin / Coumadin Indication: DVT/PE  Allergies  Allergen Reactions  . Lisinopril Other (See Comments)    cough    Patient Measurements: Height: 5\' 10"  (177.8 cm) Weight: 364 lb 6.4 oz (165.291 kg) IBW/kg (Calculated) : 73  Vital Signs: Temp: 97.5 F (36.4 C) (01/14 1011) Temp src: Oral (01/14 1011) BP: 133/78 mmHg (01/14 1011) Pulse Rate: 89 (01/14 1011)  Labs:  Recent Labs  12/07/13 0603 12/08/13 0535 12/09/13 0405  HGB 12.8* 13.6  --   HCT 40.7 42.3  --   PLT 227 231  --   LABPROT 18.9* 23.6* 23.9*  INR 1.63* 2.18* 2.22*  HEPARINUNFRC 0.37 0.45  --     Estimated Creatinine Clearance: 179.4 ml/min (by C-G formula based on Cr of 0.75).  Assessment: CC: numbness and paralysis x 2 months (started in legs and moved up to belly)  47yoM with chronic severe back pain (6 spinal surgeries in the past), needs chronic coumadin for DVT and hx PE with IVC filter, 4 month history of gradual onset bilateral legs paralysis.  Anticoagulation: Coumadin PTA for hx mult PE's + DVT (10mg  daily except 5mg  on Mon / Sat PTA). Heparin for bridge until no plans for surgery determined - pt not surgery candidate per Dr. Saintclair Halsted-- he wants a second opinion in [place]. INR up to 2.22.  Infectious Disease: afebrile, WBC 7.1  Cardiovascular: HTN - VSS, metoprolol XL  Endocrinology: h/o Hyperglycemia, glucose ok  Nephrology: Scr and lytes stable, CrCl > 100  Pulmonary: RA  Hematology / Oncology: CBC stable  PTA Medication Issues: Metformin PTA  Best Practices: Coumadin-->Heparin   Goal of Therapy:  INR 2-3 Monitor platelets by anticoagulation protocol: Yes   Plan:  Recommend Coumadin 10mg  daily adjusted PRN at transferred facility. Awaiting insurance authorization for transfer   [place]. Alford Highland, PharmD, [facility] Clinical Staff Pharmacist Pager [phone]  12/09/2013 11:15 AM

## 2013-12-09 NOTE — Progress Notes (Signed)
Patient transferred to [facility].  Patient notified of transfer.  IV discontinued.  Telemetry removed.  Patient verbalized understanding of transfer.  Lorriane Shire, SW arranged ride via ambulance.  Patient escorted out via stretcher with ambulance.  Hard copy of oxycodone and vicodin sent to facility

## 2013-12-09 NOTE — Clinical Social Work Placement (Signed)
Clinical Social Work Department CLINICAL SOCIAL WORK PLACEMENT NOTE 12/09/2013  Patient:  Nicolas [name], Nicolas [name]  Account Number:  [id number] Admit date:  12/02/2013  Clinical Social Worker:  Kambry Takacs Givens, LCSW  Date/time:  12/09/2013 03:16 AM  Clinical Social Work is seeking post-discharge placement for this patient at the following level of care:   SKILLED NURSING   (*CSW will update this form in Epic as items are completed)     Patient/family provided with [place] Department of Clinical Social Work's list of facilities offering this level of care within the geographic area requested by the patient (or if unable, by the patient's family).  12/07/2013  Patient/family informed of their freedom to choose among providers that offer the needed level of care, that participate in Medicare, Medicaid or managed care program needed by the patient, have an available bed and are willing to accept the patient.    Patient/family informed of [facility]' ownership interest in [facility], as well as of the fact that they are under no obligation to receive care at this facility.  PASARR submitted to EDS on 12/04/2011 PASARR number received from EDS on 12/09/2011  FL2 transmitted to all facilities in geographic area requested by pt/family on  12/07/2013 FL2 transmitted to all facilities within larger geographic area on 12/09/2013  Patient informed that his/her managed care company has contracts with or will negotiate with  certain facilities, including the following:     Patient/family informed of bed offers received:  12/08/2013 Patient chooses bed at [facility] Physician recommends and patient chooses bed at    Patient to be transferred to [facility] on  12/09/2013 Patient to be transferred to facility by ambulance  The following physician request were entered in Epic:   Additional Comments:

## 2013-12-24 ENCOUNTER — Telehealth: Payer: Self-pay | Admitting: Radiology

## 2013-12-24 DIAGNOSIS — R269 Unspecified abnormalities of gait and mobility: Secondary | ICD-10-CM

## 2013-12-24 NOTE — Telephone Encounter (Signed)
Patient was not accepted into skilled facility following a recent hospitalization. I have spoken to [facility] regarding patient. Beth [phone],  Is wanting to resume home health for patient. PT OT and nursing . I have advised her his spine surgeon should order the PT, I am not sure how aggressive his therapy should be. I am very concerned, he definitely needs nursing care. I do not know at this point who is ordering the PT/ INR's for patient either. Beth will try to find out who she is to call.  Discussed with Dr Leward Quan, she will give order for the nursing evaluation and treatment,  home health. Dr Saintclair Halsted should give PT order, and Dr Julianne Handler should advise on his coumadin management.   Spoke to [place] at [facility] and have given the order for nursing only. Advised PT order comes from Dr Saintclair Halsted, anything with his coumadin comes from Dr Julianne Handler, provided her the numbers for these providers.

## 2013-12-27 HISTORY — PX: SPINE SURGERY: SHX786

## 2014-01-29 ENCOUNTER — Ambulatory Visit (INDEPENDENT_AMBULATORY_CARE_PROVIDER_SITE_OTHER): Payer: Medicare HMO | Admitting: Pharmacist

## 2014-01-29 DIAGNOSIS — I824Y9 Acute embolism and thrombosis of unspecified deep veins of unspecified proximal lower extremity: Secondary | ICD-10-CM

## 2014-01-29 DIAGNOSIS — Z7901 Long term (current) use of anticoagulants: Secondary | ICD-10-CM

## 2014-01-29 DIAGNOSIS — R609 Edema, unspecified: Secondary | ICD-10-CM

## 2014-01-29 DIAGNOSIS — Z86711 Personal history of pulmonary embolism: Secondary | ICD-10-CM

## 2014-01-29 DIAGNOSIS — R600 Localized edema: Secondary | ICD-10-CM

## 2014-01-29 LAB — POCT INR: INR: 2.2

## 2014-02-03 ENCOUNTER — Telehealth: Payer: Self-pay

## 2014-02-03 NOTE — Telephone Encounter (Signed)
Please advise 

## 2014-02-03 NOTE — Telephone Encounter (Signed)
AMY WITH [facility] WOULD LIKE TO HAVE A VERBAL ORDER FOR PT TO HAVE OCCUPATIONAL THERAPY TWICE A WEEK FOR 5 WEEKS PLEASE CALL [phone]

## 2014-02-04 NOTE — Telephone Encounter (Signed)
I spoke w/ Amy at [facility] and gave auth for OT twice a week for 5 weeks.

## 2014-02-05 ENCOUNTER — Ambulatory Visit (INDEPENDENT_AMBULATORY_CARE_PROVIDER_SITE_OTHER): Payer: Medicare HMO | Admitting: Pharmacist

## 2014-02-05 DIAGNOSIS — I824Y9 Acute embolism and thrombosis of unspecified deep veins of unspecified proximal lower extremity: Secondary | ICD-10-CM

## 2014-02-05 DIAGNOSIS — R609 Edema, unspecified: Secondary | ICD-10-CM

## 2014-02-05 DIAGNOSIS — Z7901 Long term (current) use of anticoagulants: Secondary | ICD-10-CM

## 2014-02-05 DIAGNOSIS — R600 Localized edema: Secondary | ICD-10-CM

## 2014-02-05 DIAGNOSIS — Z86711 Personal history of pulmonary embolism: Secondary | ICD-10-CM

## 2014-02-05 LAB — POCT INR: INR: 1.4

## 2014-02-11 ENCOUNTER — Telehealth: Payer: Self-pay | Admitting: Radiology
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
## 2014-02-12 ENCOUNTER — Ambulatory Visit (INDEPENDENT_AMBULATORY_CARE_PROVIDER_SITE_OTHER): Payer: Medicare HMO | Admitting: Cardiology

## 2014-02-12 ENCOUNTER — Encounter: Payer: Self-pay | Admitting: Family Medicine

## 2014-02-12 ENCOUNTER — Ambulatory Visit (INDEPENDENT_AMBULATORY_CARE_PROVIDER_SITE_OTHER): Payer: Medicare PPO | Admitting: Family Medicine

## 2014-02-12 VITALS — BP 126/76 | HR 102 | Temp 99.3°F | Resp 16 | Ht 69.0 in | Wt 330.0 lb

## 2014-02-12 DIAGNOSIS — Z760 Encounter for issue of repeat prescription: Secondary | ICD-10-CM

## 2014-02-12 DIAGNOSIS — Z7901 Long term (current) use of anticoagulants: Secondary | ICD-10-CM

## 2014-02-12 DIAGNOSIS — R609 Edema, unspecified: Secondary | ICD-10-CM

## 2014-02-12 DIAGNOSIS — I824Y9 Acute embolism and thrombosis of unspecified deep veins of unspecified proximal lower extremity: Secondary | ICD-10-CM

## 2014-02-12 DIAGNOSIS — G4730 Sleep apnea, unspecified: Secondary | ICD-10-CM

## 2014-02-12 DIAGNOSIS — Z86711 Personal history of pulmonary embolism: Secondary | ICD-10-CM

## 2014-02-12 DIAGNOSIS — M4800 Spinal stenosis, site unspecified: Secondary | ICD-10-CM

## 2014-02-12 DIAGNOSIS — I10 Essential (primary) hypertension: Secondary | ICD-10-CM

## 2014-02-12 DIAGNOSIS — E119 Type 2 diabetes mellitus without complications: Secondary | ICD-10-CM

## 2014-02-12 DIAGNOSIS — R600 Localized edema: Secondary | ICD-10-CM

## 2014-02-12 LAB — POCT GLYCOSYLATED HEMOGLOBIN (HGB A1C): Hemoglobin A1C: 6.6

## 2014-02-12 LAB — POCT INR: INR: 1.8

## 2014-02-12 MED ORDER — KETOCONAZOLE 2 % EX SHAM: 1.0000 "application " | MEDICATED_SHAMPOO | Freq: Every day | CUTANEOUS | Status: DC

## 2014-02-12 MED ORDER — TRAZODONE HCL 100 MG PO TABS: 100.0000 mg | ORAL_TABLET | Freq: Every day | ORAL | Status: DC

## 2014-02-12 MED ORDER — TORSEMIDE 20 MG PO TABS: 20.0000 mg | ORAL_TABLET | Freq: Every day | ORAL | Status: DC

## 2014-02-12 MED ORDER — OXYCODONE HCL 5 MG PO TABS: 5.0000 mg | ORAL_TABLET | Freq: Four times a day (QID) | ORAL | Status: DC | PRN

## 2014-02-12 MED ORDER — CLOTRIMAZOLE-BETAMETHASONE 1-0.05 % EX CREA: 1.0000 "application " | TOPICAL_CREAM | Freq: Two times a day (BID) | CUTANEOUS | Status: DC

## 2014-02-12 MED ORDER — WARFARIN SODIUM 10 MG PO TABS: 10.0000 mg | ORAL_TABLET | ORAL | Status: DC

## 2014-02-12 MED ORDER — POTASSIUM CHLORIDE CRYS ER 20 MEQ PO TBCR: 20.0000 meq | EXTENDED_RELEASE_TABLET | Freq: Two times a day (BID) | ORAL | Status: DC

## 2014-02-12 MED ORDER — METOPROLOL SUCCINATE ER 50 MG PO TB24: 50.0000 mg | ORAL_TABLET | Freq: Every day | ORAL | Status: DC

## 2014-02-12 MED ORDER — METFORMIN HCL 500 MG PO TABS: 500.0000 mg | ORAL_TABLET | Freq: Two times a day (BID) | ORAL | Status: DC

## 2014-02-12 MED ORDER — SERTRALINE HCL 50 MG PO TABS: 75.0000 mg | ORAL_TABLET | Freq: Every day | ORAL | Status: DC

## 2014-02-12 MED ORDER — DIAZEPAM 10 MG PO TABS
ORAL_TABLET | ORAL | Status: DC
Start: 2014-02-12 — End: 2014-03-19

## 2014-02-12 MED ORDER — SERTRALINE HCL 100 MG PO TABS: ORAL_TABLET | ORAL | Status: DC
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
## 2014-02-19 ENCOUNTER — Ambulatory Visit (INDEPENDENT_AMBULATORY_CARE_PROVIDER_SITE_OTHER): Payer: Medicare HMO | Admitting: Cardiology

## 2014-02-19 DIAGNOSIS — R609 Edema, unspecified: Secondary | ICD-10-CM

## 2014-02-19 DIAGNOSIS — R600 Localized edema: Secondary | ICD-10-CM

## 2014-02-19 DIAGNOSIS — I824Y9 Acute embolism and thrombosis of unspecified deep veins of unspecified proximal lower extremity: Secondary | ICD-10-CM

## 2014-02-19 DIAGNOSIS — Z86711 Personal history of pulmonary embolism: Secondary | ICD-10-CM

## 2014-02-19 DIAGNOSIS — Z7901 Long term (current) use of anticoagulants: Secondary | ICD-10-CM

## 2014-02-19 LAB — POCT INR: INR: 2.9

## 2014-02-26 ENCOUNTER — Ambulatory Visit (INDEPENDENT_AMBULATORY_CARE_PROVIDER_SITE_OTHER): Payer: Medicare HMO | Admitting: Pharmacist

## 2014-02-26 DIAGNOSIS — Z86711 Personal history of pulmonary embolism: Secondary | ICD-10-CM

## 2014-02-26 DIAGNOSIS — R600 Localized edema: Secondary | ICD-10-CM

## 2014-02-26 DIAGNOSIS — I824Y9 Acute embolism and thrombosis of unspecified deep veins of unspecified proximal lower extremity: Secondary | ICD-10-CM

## 2014-02-26 DIAGNOSIS — Z7901 Long term (current) use of anticoagulants: Secondary | ICD-10-CM

## 2014-02-26 DIAGNOSIS — R609 Edema, unspecified: Secondary | ICD-10-CM

## 2014-02-26 LAB — POCT INR: INR: 3.6

## 2014-03-01 ENCOUNTER — Institutional Professional Consult (permissible substitution): Payer: Medicare PPO | Admitting: Neurology

## 2014-03-02 ENCOUNTER — Ambulatory Visit (INDEPENDENT_AMBULATORY_CARE_PROVIDER_SITE_OTHER): Payer: Medicare HMO | Admitting: Pharmacist

## 2014-03-02 DIAGNOSIS — R609 Edema, unspecified: Secondary | ICD-10-CM

## 2014-03-02 DIAGNOSIS — Z7901 Long term (current) use of anticoagulants: Secondary | ICD-10-CM

## 2014-03-02 DIAGNOSIS — Z86711 Personal history of pulmonary embolism: Secondary | ICD-10-CM

## 2014-03-02 DIAGNOSIS — R600 Localized edema: Secondary | ICD-10-CM

## 2014-03-02 DIAGNOSIS — I824Y9 Acute embolism and thrombosis of unspecified deep veins of unspecified proximal lower extremity: Secondary | ICD-10-CM

## 2014-03-02 LAB — POCT INR: INR: 2.1

## 2014-03-09 ENCOUNTER — Ambulatory Visit (INDEPENDENT_AMBULATORY_CARE_PROVIDER_SITE_OTHER): Payer: Medicare HMO | Admitting: Cardiovascular Disease

## 2014-03-09 DIAGNOSIS — R609 Edema, unspecified: Secondary | ICD-10-CM

## 2014-03-09 DIAGNOSIS — I824Y9 Acute embolism and thrombosis of unspecified deep veins of unspecified proximal lower extremity: Secondary | ICD-10-CM

## 2014-03-09 DIAGNOSIS — R600 Localized edema: Secondary | ICD-10-CM

## 2014-03-09 DIAGNOSIS — Z7901 Long term (current) use of anticoagulants: Secondary | ICD-10-CM

## 2014-03-09 DIAGNOSIS — Z86711 Personal history of pulmonary embolism: Secondary | ICD-10-CM

## 2014-03-09 LAB — POCT INR: INR: 2.6

## 2014-03-17 ENCOUNTER — Telehealth: Payer: Self-pay | Admitting: *Deleted

## 2014-03-17 NOTE — Telephone Encounter (Signed)
Faxed signed order to [facility] ([place]), per Dr Leward Quan.

## 2014-03-19 ENCOUNTER — Encounter: Payer: Self-pay | Admitting: Family Medicine

## 2014-03-19 ENCOUNTER — Ambulatory Visit (INDEPENDENT_AMBULATORY_CARE_PROVIDER_SITE_OTHER): Payer: Commercial Managed Care - HMO | Admitting: Family Medicine

## 2014-03-19 VITALS — BP 150/79 | HR 74 | Temp 97.8°F | Resp 16

## 2014-03-19 DIAGNOSIS — S8000XA Contusion of unspecified knee, initial encounter: Secondary | ICD-10-CM

## 2014-03-19 DIAGNOSIS — I10 Essential (primary) hypertension: Secondary | ICD-10-CM

## 2014-03-19 DIAGNOSIS — Z760 Encounter for issue of repeat prescription: Secondary | ICD-10-CM

## 2014-03-19 DIAGNOSIS — S8002XA Contusion of left knee, initial encounter: Secondary | ICD-10-CM

## 2014-03-19 MED ORDER — DIAZEPAM 10 MG PO TABS: ORAL_TABLET | ORAL | Status: DC

## 2014-03-19 MED ORDER — DIAZEPAM 10 MG PO TABS
ORAL_TABLET | ORAL | Status: DC
Start: 2014-03-19 — End: 2014-03-19
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
## 2014-03-23 ENCOUNTER — Telehealth: Payer: Self-pay | Admitting: *Deleted

## 2014-03-23 ENCOUNTER — Ambulatory Visit (INDEPENDENT_AMBULATORY_CARE_PROVIDER_SITE_OTHER): Payer: Medicare HMO | Admitting: Cardiology

## 2014-03-23 DIAGNOSIS — Z7901 Long term (current) use of anticoagulants: Secondary | ICD-10-CM

## 2014-03-23 DIAGNOSIS — R609 Edema, unspecified: Secondary | ICD-10-CM

## 2014-03-23 DIAGNOSIS — R600 Localized edema: Secondary | ICD-10-CM

## 2014-03-23 DIAGNOSIS — Z86711 Personal history of pulmonary embolism: Secondary | ICD-10-CM

## 2014-03-23 DIAGNOSIS — I824Y9 Acute embolism and thrombosis of unspecified deep veins of unspecified proximal lower extremity: Secondary | ICD-10-CM

## 2014-03-23 LAB — POCT INR: INR: 2.6

## 2014-03-23 NOTE — Telephone Encounter (Signed)
Faxed signed order for PT to [facility], per Dr Leward Quan. Confirmation received page at 1:46 pm.

## 2014-03-26 ENCOUNTER — Telehealth: Payer: Self-pay | Admitting: *Deleted

## 2014-03-26 ENCOUNTER — Ambulatory Visit (INDEPENDENT_AMBULATORY_CARE_PROVIDER_SITE_OTHER): Payer: Commercial Managed Care - HMO | Admitting: Neurology

## 2014-03-26 ENCOUNTER — Encounter: Payer: Self-pay | Admitting: *Deleted

## 2014-03-26 ENCOUNTER — Encounter: Payer: Self-pay | Admitting: Neurology

## 2014-03-26 VITALS — BP 144/79 | HR 79

## 2014-03-26 VITALS — HR 77

## 2014-03-26 DIAGNOSIS — I2699 Other pulmonary embolism without acute cor pulmonale: Secondary | ICD-10-CM

## 2014-03-26 DIAGNOSIS — F489 Nonpsychotic mental disorder, unspecified: Secondary | ICD-10-CM

## 2014-03-26 DIAGNOSIS — E662 Morbid (severe) obesity with alveolar hypoventilation: Secondary | ICD-10-CM

## 2014-03-26 DIAGNOSIS — Z993 Dependence on wheelchair: Secondary | ICD-10-CM

## 2014-03-26 DIAGNOSIS — G4733 Obstructive sleep apnea (adult) (pediatric): Secondary | ICD-10-CM

## 2014-03-26 NOTE — Sleep Study (Signed)
Patient arrives for HST instruction.  Patient is given written instructions, picture instructions, and a demonstration on how to use HST unit.  All questions / concerns were addressed by technologist.  Financial responsibility was explained.  Follow up information was given to patient regarding how results would be received.  Pt will return unit on Monday 03/29/14.

## 2014-03-26 NOTE — Telephone Encounter (Signed)
Faxed signed order to [place] at [facility] , per Dr Leward Quan. Confirmation page received at 12:26 pm.
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
## 2014-04-13 ENCOUNTER — Telehealth: Payer: Self-pay | Admitting: Neurology

## 2014-04-13 ENCOUNTER — Other Ambulatory Visit: Payer: Self-pay | Admitting: *Deleted

## 2014-04-13 ENCOUNTER — Ambulatory Visit (INDEPENDENT_AMBULATORY_CARE_PROVIDER_SITE_OTHER): Payer: Medicare HMO | Admitting: Interventional Cardiology

## 2014-04-13 ENCOUNTER — Encounter: Payer: Self-pay | Admitting: *Deleted

## 2014-04-13 DIAGNOSIS — Z86711 Personal history of pulmonary embolism: Secondary | ICD-10-CM

## 2014-04-13 DIAGNOSIS — Z7901 Long term (current) use of anticoagulants: Secondary | ICD-10-CM

## 2014-04-13 DIAGNOSIS — R600 Localized edema: Secondary | ICD-10-CM

## 2014-04-13 DIAGNOSIS — R609 Edema, unspecified: Secondary | ICD-10-CM

## 2014-04-13 DIAGNOSIS — G4733 Obstructive sleep apnea (adult) (pediatric): Secondary | ICD-10-CM

## 2014-04-13 DIAGNOSIS — E662 Morbid (severe) obesity with alveolar hypoventilation: Secondary | ICD-10-CM

## 2014-04-13 DIAGNOSIS — G4736 Sleep related hypoventilation in conditions classified elsewhere: Secondary | ICD-10-CM

## 2014-04-13 DIAGNOSIS — I824Y9 Acute embolism and thrombosis of unspecified deep veins of unspecified proximal lower extremity: Secondary | ICD-10-CM

## 2014-04-13 DIAGNOSIS — I2699 Other pulmonary embolism without acute cor pulmonale: Secondary | ICD-10-CM

## 2014-04-13 DIAGNOSIS — Z993 Dependence on wheelchair: Secondary | ICD-10-CM

## 2014-04-13 LAB — POCT INR: INR: 3

## 2014-04-13 NOTE — Telephone Encounter (Signed)
I called and spoke with the patient about his recent HST results. I informed the patient that the study revealed mild obstructive sleep and obesity Hypoventilation Syndrome. I informed the patient that I will send his [employer]. I will fax a copy of the report to Dr. Pamala Hurry McPherson's office and mail a copy to the patient along with a follow up instruction letter.

## 2014-04-14 ENCOUNTER — Other Ambulatory Visit (HOSPITAL_COMMUNITY): Payer: Self-pay | Admitting: Physician Assistant

## 2014-04-14 DIAGNOSIS — M4804 Spinal stenosis, thoracic region: Secondary | ICD-10-CM

## 2014-04-15 NOTE — Sleep Study (Signed)
See media tab for full report  

## 2014-04-28 ENCOUNTER — Ambulatory Visit (HOSPITAL_COMMUNITY): Admission: RE | Admit: 2014-04-28 | Payer: Medicare HMO | Source: Ambulatory Visit

## 2014-04-29 LAB — PULMONARY FUNCTION TEST

## 2014-04-30 ENCOUNTER — Encounter: Payer: Self-pay | Admitting: Neurology

## 2014-05-03 ENCOUNTER — Ambulatory Visit (INDEPENDENT_AMBULATORY_CARE_PROVIDER_SITE_OTHER): Payer: Medicare HMO | Admitting: Pharmacist

## 2014-05-03 DIAGNOSIS — Z86711 Personal history of pulmonary embolism: Secondary | ICD-10-CM

## 2014-05-03 DIAGNOSIS — R600 Localized edema: Secondary | ICD-10-CM

## 2014-05-03 DIAGNOSIS — Z7901 Long term (current) use of anticoagulants: Secondary | ICD-10-CM

## 2014-05-03 DIAGNOSIS — I824Y9 Acute embolism and thrombosis of unspecified deep veins of unspecified proximal lower extremity: Secondary | ICD-10-CM

## 2014-05-03 DIAGNOSIS — R609 Edema, unspecified: Secondary | ICD-10-CM

## 2014-05-03 LAB — POCT INR: INR: 2.4

## 2014-05-14 ENCOUNTER — Ambulatory Visit (HOSPITAL_COMMUNITY)
Admission: RE | Admit: 2014-05-14 | Discharge: 2014-05-14 | Disposition: A | Discharge status: Home / Self Care | Payer: Medicare HMO | Source: Ambulatory Visit | Attending: Physician Assistant | Admitting: Physician Assistant

## 2014-05-14 DIAGNOSIS — G8220 Paraplegia, unspecified: Secondary | ICD-10-CM | POA: Insufficient documentation

## 2014-05-14 DIAGNOSIS — M4804 Spinal stenosis, thoracic region: Secondary | ICD-10-CM

## 2014-05-14 DIAGNOSIS — R32 Unspecified urinary incontinence: Secondary | ICD-10-CM | POA: Insufficient documentation

## 2014-05-14 LAB — CREATININE, SERUM
Creatinine, Ser: 0.63 mg/dL (ref 0.50–1.35)
GFR calc Af Amer: >90 mL/min (ref >90)
GFR calc non Af Amer: >90 mL/min (ref >90)

## 2014-05-14 MED ORDER — GADOBENATE DIMEGLUMINE 529 MG/ML IV SOLN
20.0000 mL | Freq: Once | INTRAVENOUS | Status: AC | PRN
  Administered 2014-05-14: 20 mL via INTRAVENOUS

## 2014-05-21 ENCOUNTER — Emergency Department (HOSPITAL_COMMUNITY): Payer: Medicare HMO

## 2014-05-21 ENCOUNTER — Inpatient Hospital Stay (HOSPITAL_COMMUNITY)
Admission: EM | Admit: 2014-05-21 | Discharge: 2014-06-01 | DRG: 417 | Disposition: A | Discharge status: Home / Self Care | Payer: Medicare HMO | Attending: Surgery | Admitting: Surgery

## 2014-05-21 ENCOUNTER — Encounter (HOSPITAL_COMMUNITY): Payer: Self-pay | Admitting: Emergency Medicine

## 2014-05-21 DIAGNOSIS — Z79899 Other long term (current) drug therapy: Secondary | ICD-10-CM

## 2014-05-21 DIAGNOSIS — K838 Other specified diseases of biliary tract: Secondary | ICD-10-CM | POA: Diagnosis not present

## 2014-05-21 DIAGNOSIS — K929 Disease of digestive system, unspecified: Secondary | ICD-10-CM | POA: Diagnosis not present

## 2014-05-21 DIAGNOSIS — R0902 Hypoxemia: Secondary | ICD-10-CM

## 2014-05-21 DIAGNOSIS — Z86718 Personal history of other venous thrombosis and embolism: Secondary | ICD-10-CM

## 2014-05-21 DIAGNOSIS — R791 Abnormal coagulation profile: Secondary | ICD-10-CM

## 2014-05-21 DIAGNOSIS — R000 Tachycardia, unspecified: Secondary | ICD-10-CM | POA: Diagnosis not present

## 2014-05-21 DIAGNOSIS — E876 Hypokalemia: Secondary | ICD-10-CM | POA: Diagnosis not present

## 2014-05-21 DIAGNOSIS — D72829 Elevated white blood cell count, unspecified: Secondary | ICD-10-CM

## 2014-05-21 DIAGNOSIS — Z87891 Personal history of nicotine dependence: Secondary | ICD-10-CM

## 2014-05-21 DIAGNOSIS — I10 Essential (primary) hypertension: Secondary | ICD-10-CM

## 2014-05-21 DIAGNOSIS — R112 Nausea with vomiting, unspecified: Secondary | ICD-10-CM

## 2014-05-21 DIAGNOSIS — I5033 Acute on chronic diastolic (congestive) heart failure: Secondary | ICD-10-CM | POA: Diagnosis not present

## 2014-05-21 DIAGNOSIS — K828 Other specified diseases of gallbladder: Secondary | ICD-10-CM

## 2014-05-21 DIAGNOSIS — F329 Major depressive disorder, single episode, unspecified: Secondary | ICD-10-CM | POA: Diagnosis present

## 2014-05-21 DIAGNOSIS — Z6841 Body Mass Index (BMI) 40.0 and over, adult: Secondary | ICD-10-CM

## 2014-05-21 DIAGNOSIS — E119 Type 2 diabetes mellitus without complications: Secondary | ICD-10-CM

## 2014-05-21 DIAGNOSIS — Y836 Removal of other organ (partial) (total) as the cause of abnormal reaction of the patient, or of later complication, without mention of misadventure at the time of the procedure: Secondary | ICD-10-CM | POA: Diagnosis not present

## 2014-05-21 DIAGNOSIS — G4733 Obstructive sleep apnea (adult) (pediatric): Secondary | ICD-10-CM | POA: Diagnosis present

## 2014-05-21 DIAGNOSIS — R1011 Right upper quadrant pain: Secondary | ICD-10-CM

## 2014-05-21 DIAGNOSIS — K810 Acute cholecystitis: Principal | ICD-10-CM | POA: Diagnosis present

## 2014-05-21 DIAGNOSIS — F3289 Other specified depressive episodes: Secondary | ICD-10-CM | POA: Diagnosis present

## 2014-05-21 DIAGNOSIS — K5900 Constipation, unspecified: Secondary | ICD-10-CM | POA: Diagnosis not present

## 2014-05-21 DIAGNOSIS — Z86711 Personal history of pulmonary embolism: Secondary | ICD-10-CM

## 2014-05-21 DIAGNOSIS — Z7901 Long term (current) use of anticoagulants: Secondary | ICD-10-CM

## 2014-05-21 LAB — CBC WITH DIFFERENTIAL/PLATELET
Basophils Absolute: 0 10*3/uL (ref 0.0–0.1)
Basophils Relative: 0 % (ref 0–1)
Eosinophils Absolute: 0 10*3/uL (ref 0.0–0.7)
Eosinophils Relative: 0 % (ref 0–5)
HCT: 47.3 % (ref 39.0–52.0)
Hemoglobin: 15.1 g/dL (ref 13.0–17.0)
Lymphocytes Relative: 6 % — ABNORMAL LOW (ref 12–46)
Lymphs Abs: 0.9 10*3/uL (ref 0.7–4.0)
MCH: 27.1 pg (ref 26.0–34.0)
MCHC: 31.9 g/dL (ref 30.0–36.0)
MCV: 84.9 fL (ref 78.0–100.0)
Monocytes Absolute: 1.7 10*3/uL — ABNORMAL HIGH (ref 0.1–1.0)
Monocytes Relative: 11 % (ref 3–12)
Neutro Abs: 12.8 10*3/uL — ABNORMAL HIGH (ref 1.7–7.7)
Neutrophils Relative %: 83 % — ABNORMAL HIGH (ref 43–77)
Platelets: 238 10*3/uL (ref 150–400)
RBC: 5.57 MIL/uL (ref 4.22–5.81)
RDW: 16.3 % — ABNORMAL HIGH (ref 11.5–15.5)
WBC: 15.5 10*3/uL — ABNORMAL HIGH (ref 4.0–10.5)

## 2014-05-21 LAB — URINALYSIS, ROUTINE W REFLEX MICROSCOPIC
Bilirubin Urine: NEGATIVE
Glucose, UA: 100 mg/dL — AB
Hgb urine dipstick: NEGATIVE
Ketones, ur: NEGATIVE mg/dL
Leukocytes, UA: NEGATIVE
Nitrite: NEGATIVE
Protein, ur: NEGATIVE mg/dL
Specific Gravity, Urine: 1.013 (ref 1.005–1.030)
Urobilinogen, UA: 1 mg/dL (ref 0.0–1.0)
pH: 8 (ref 5.0–8.0)

## 2014-05-21 LAB — BASIC METABOLIC PANEL
BUN: 4 mg/dL — ABNORMAL LOW (ref 6–23)
CO2: 27 mEq/L (ref 19–32)
Calcium: 9.6 mg/dL (ref 8.4–10.5)
Chloride: 93 mEq/L — ABNORMAL LOW (ref 96–112)
Creatinine, Ser: 0.6 mg/dL (ref 0.50–1.35)
GFR calc Af Amer: >90 mL/min (ref >90)
GFR calc non Af Amer: >90 mL/min (ref >90)
Glucose, Bld: 161 mg/dL — ABNORMAL HIGH (ref 70–99)
Potassium: 3.5 mEq/L — ABNORMAL LOW (ref 3.7–5.3)
Sodium: 135 mEq/L — ABNORMAL LOW (ref 137–147)

## 2014-05-21 LAB — LIPASE, BLOOD: Lipase: 19 U/L (ref 11–59)

## 2014-05-21 LAB — PROTIME-INR
INR: 3.16 — ABNORMAL HIGH (ref 0.00–1.49)
Prothrombin Time: 32.4 seconds — ABNORMAL HIGH (ref 11.6–15.2)

## 2014-05-21 LAB — ABO/RH: ABO/RH(D): A POS

## 2014-05-21 LAB — CBG MONITORING, ED
Glucose-Capillary: 169 mg/dL — ABNORMAL HIGH (ref 70–99)
Glucose-Capillary: 179 mg/dL — ABNORMAL HIGH (ref 70–99)

## 2014-05-21 LAB — GLUCOSE, CAPILLARY: Glucose-Capillary: 191 mg/dL — ABNORMAL HIGH (ref 70–99)

## 2014-05-21 MED ORDER — IOHEXOL 300 MG/ML  SOLN: 25.0000 mL | INTRAMUSCULAR | Status: AC

## 2014-05-21 MED ORDER — INSULIN ASPART 100 UNIT/ML ~~LOC~~ SOLN: 0.0000 [IU] | SUBCUTANEOUS | Status: DC

## 2014-05-21 MED ORDER — HYDROMORPHONE HCL PF 1 MG/ML IJ SOLN
1.0000 mg | INTRAMUSCULAR | Status: DC | PRN
  Administered 2014-05-21 – 2014-05-31 (×42): 1 mg via INTRAVENOUS
  Filled 2014-05-21 (×43): qty 1

## 2014-05-21 MED ORDER — ONDANSETRON HCL 4 MG/2ML IJ SOLN
4.0000 mg | Freq: Once | INTRAMUSCULAR | Status: AC
  Administered 2014-05-21: 4 mg via INTRAVENOUS
  Filled 2014-05-21: qty 2

## 2014-05-21 MED ORDER — SODIUM CHLORIDE 0.9 % IV BOLUS (SEPSIS)
1000.0000 mL | Freq: Once | INTRAVENOUS | Status: AC
  Administered 2014-05-21: 1000 mL via INTRAVENOUS

## 2014-05-21 MED ORDER — VITAMIN K1 10 MG/ML IJ SOLN
10.0000 mg | Freq: Once | INTRAVENOUS | Status: AC
  Administered 2014-05-21: 10 mg via INTRAVENOUS
  Filled 2014-05-21: qty 1

## 2014-05-21 MED ORDER — INSULIN ASPART 100 UNIT/ML ~~LOC~~ SOLN
0.0000 [IU] | SUBCUTANEOUS | Status: DC
  Administered 2014-05-22: 4 [IU] via SUBCUTANEOUS
  Administered 2014-05-22 (×2): 3 [IU] via SUBCUTANEOUS
  Administered 2014-05-22: 4 [IU] via SUBCUTANEOUS
  Administered 2014-05-22: 3 [IU] via SUBCUTANEOUS
  Administered 2014-05-22: 4 [IU] via SUBCUTANEOUS
  Administered 2014-05-23: 3 [IU] via SUBCUTANEOUS

## 2014-05-21 MED ORDER — PANTOPRAZOLE SODIUM 40 MG IV SOLR
40.0000 mg | Freq: Every day | INTRAVENOUS | Status: DC
  Administered 2014-05-21 – 2014-05-27 (×7): 40 mg via INTRAVENOUS
  Filled 2014-05-21 (×12): qty 40

## 2014-05-21 MED ORDER — TORSEMIDE 20 MG PO TABS
20.0000 mg | ORAL_TABLET | Freq: Every day | ORAL | Status: DC
  Administered 2014-05-21 – 2014-06-01 (×11): 20 mg via ORAL
  Filled 2014-05-21 (×12): qty 1

## 2014-05-21 MED ORDER — IOHEXOL 300 MG/ML  SOLN
100.0000 mL | Freq: Once | INTRAMUSCULAR | Status: AC | PRN
  Administered 2014-05-21: 100 mL via INTRAVENOUS

## 2014-05-21 MED ORDER — SERTRALINE HCL 100 MG PO TABS
100.0000 mg | ORAL_TABLET | Freq: Every day | ORAL | Status: DC
  Administered 2014-05-21 – 2014-06-01 (×11): 100 mg via ORAL
  Filled 2014-05-21 (×12): qty 1

## 2014-05-21 MED ORDER — ONDANSETRON HCL 4 MG/2ML IJ SOLN: 4.0000 mg | Freq: Four times a day (QID) | INTRAMUSCULAR | Status: DC | PRN

## 2014-05-21 MED ORDER — METOPROLOL SUCCINATE ER 50 MG PO TB24
50.0000 mg | ORAL_TABLET | Freq: Every day | ORAL | Status: DC
  Administered 2014-05-21 – 2014-05-26 (×6): 50 mg via ORAL
  Filled 2014-05-21 (×7): qty 1

## 2014-05-21 MED ORDER — POTASSIUM CHLORIDE IN NACL 20-0.45 MEQ/L-% IV SOLN
INTRAVENOUS | Status: DC
  Administered 2014-05-21 – 2014-05-23 (×3): via INTRAVENOUS
  Filled 2014-05-21 (×6): qty 1000

## 2014-05-21 MED ORDER — PIPERACILLIN-TAZOBACTAM 3.375 G IVPB
3.3750 g | Freq: Three times a day (TID) | INTRAVENOUS | Status: DC
  Administered 2014-05-21 – 2014-05-27 (×17): 3.375 g via INTRAVENOUS
  Filled 2014-05-21 (×18): qty 50

## 2014-05-21 MED ORDER — ACETAMINOPHEN 325 MG PO TABS
650.0000 mg | ORAL_TABLET | Freq: Once | ORAL | Status: AC
  Administered 2014-05-21: 650 mg via ORAL
  Filled 2014-05-21: qty 2

## 2014-05-21 MED ORDER — MORPHINE SULFATE 4 MG/ML IJ SOLN
6.0000 mg | Freq: Once | INTRAMUSCULAR | Status: AC
  Administered 2014-05-21: 6 mg via INTRAVENOUS
  Filled 2014-05-21: qty 2

## 2014-05-21 MED ORDER — TRAZODONE HCL 100 MG PO TABS
100.0000 mg | ORAL_TABLET | Freq: Every day | ORAL | Status: DC
  Administered 2014-05-21 – 2014-06-01 (×11): 100 mg via ORAL
  Filled 2014-05-21 (×2): qty 1
  Filled 2014-05-21 (×7): qty 2
  Filled 2014-05-21 (×10): qty 1

## 2014-05-21 NOTE — Consult Note (Signed)
[facility] Medical Consultation  Nicolas [name] QAS:[id number] DOB: 1966-03-26 DOA: 05/21/2014 PCP: Ellsworth Lennox, MD   Requesting physician: Dr. Georgette Dover Date of consultation: 05/21/14 Reason for consultation: Management of DM  Impression/Recommendations Principal Problem:   Acute acalculous cholecystitis Active Problems:   Type II or unspecified type diabetes mellitus without mention of complication, not stated as uncontrolled   Chronic anticoagulation    1. Acute acalculous cholecystitis - surgery primary and managing this likely to get cholecystectomy this admit, please see their note. 2. Chronic anticoagulation - hold coumadin and giving 10mg  vitamin K, check INR in AM. 3. DM2 - will hold metformin, putting patient on SSI Q4H med dose while he is NPO.  Suspicious that his meter at home may be not reading correctly, or alternatively his BGLs have been off due to acute illness.  Will adjust insulin dosage as needed.  Historically and through March of this year, his diabetes has been relatively easy to control, A1C was 6.6 and this is with less than max dose metformin only.  I will followup again tomorrow. Please contact me if I can be of assistance in the meanwhile. Thank you for this consultation.  Chief Complaint: RUQ abdominal pain, N/V  HPI:  48 yo M h/o NIDDM treated with low dose metformin who has not been feeling well for past 2-3 days.  Specifically he has had abdominal discomfort, throbbing like sensation, intermittent, non-radiating, associated with decrease in appetite.  Nausea yesterday and several episodes of NBNB vomiting.  No BM past 3 days as well he states.  He checked his BGL meter yesterday in the AM, was low, so he drank root beer, and then 30 mins later was in the 300s.  This morning BGL check was 526.  Presented to ED.  In ED, BGL is 161  Review of Systems:  12 systems reviewed and otherwise negative.  Past Medical History  Diagnosis Date  .  Arthritis   . Clotting disorder   . Hypertension   . Pulmonary embolism     x 3 in 2005, 2008, 2010  . DVT (deep venous thrombosis)     Summer 2012  . Spinal stenosis   . Depression   . Sleep apnea   . Diabetes mellitus without complication    Past Surgical History  Procedure Laterality Date  . Spine surgery  11/30/11    Total 6 back surgeries   Social History:  reports that he quit smoking about 13 years ago. His smoking use included Cigarettes. He has a 15 pack-year smoking history. He has never used smokeless tobacco. He reports that he drinks about .5 ounces of alcohol per week. He reports that he does not use illicit drugs.  Allergies  Allergen Reactions  . Lisinopril Other (See Comments)    cough  . Ace Inhibitors     cough   Family History  Problem Relation Age of Onset  . Cancer Mother 42    Lung  . Hypertension Father   . Diabetes Father   . Prostate cancer Father   . Hypertension Father   . Hypertension Brother   . Hypertension Brother   . Diabetes Brother     Prior to Admission medications   Medication Sig Start Date End Date Taking? Authorizing Provider  clotrimazole-betamethasone (LOTRISONE) cream Apply 1 application topically 2 (two) times daily. 02/12/14  Yes Nicolas Fanny, MD  diazepam (VALIUM) 10 MG tablet Take 1 tablet by mouth every 8 hours if needed for anxiety 03/19/14  Yes Nicolas Fanny, MD  ketoconazole (NIZORAL) 2 % shampoo Apply 1 application topically 2 (two) times a week.   Yes Historical Provider, MD  metFORMIN (GLUCOPHAGE) 500 MG tablet Take 1 tablet (500 mg total) by mouth 2 (two) times daily with a meal. 02/12/14  Yes Nicolas Fanny, MD  metoprolol succinate (TOPROL-XL) 50 MG 24 hr tablet Take 1 tablet (50 mg total) by mouth daily. 02/12/14  Yes Nicolas Fanny, MD  multivitamin (ONE-A-DAY MEN'S) TABS tablet Take 1 tablet by mouth daily.   Yes Historical Provider, MD  potassium chloride SA (KLOR-CON M20) 20 MEQ tablet Take  1 tablet (20 mEq total) by mouth 2 (two) times daily. 02/12/14  Yes Nicolas Fanny, MD  sertraline (ZOLOFT) 100 MG tablet Take 1 and 1/2 tablets by mouth daily. 02/12/14  Yes Nicolas Fanny, MD  torsemide (DEMADEX) 20 MG tablet Take 1 tablet (20 mg total) by mouth daily. 02/12/14  Yes Nicolas Fanny, MD  traZODone (DESYREL) 100 MG tablet Take 1 tablet (100 mg total) by mouth at bedtime. 02/12/14  Yes Nicolas Fanny, MD  warfarin (COUMADIN) 10 MG tablet Take 5-10 mg by mouth daily. Take 5mg  on Monday, Wednesday and Friday.  All other days of the week take 10mg .   Yes Historical Provider, MD   Physical Exam: Blood pressure 179/88, pulse 108, temperature 100.3 F (37.9 C), temperature source Oral, resp. rate 20, SpO2 98.00%. Filed Vitals:   05/21/14 1946  BP:   Pulse:   Temp:   Resp: 20    General:  NAD, resting comfortably in bed Eyes: PEERLA EOMI ENT: mucous membranes moist Neck: supple w/o JVD Cardiovascular: RRR w/o MRG Respiratory: CTA B Abdomen: soft, RUQ tenderness, nd, bs+ Skin: no rash nor lesion Musculoskeletal: MAE, full ROM all 4 extremities Psychiatric: normal tone and affect Neurologic: AAOx3, grossly non-focal  Labs on Admission:  Basic Metabolic Panel:  Recent Labs Lab 05/21/14 1146  NA 135*  K 3.5*  CL 93*  CO2 27  GLUCOSE 161*  BUN 4*  CREATININE 0.60  CALCIUM 9.6   Liver Function Tests: No results found for this basename: AST, ALT, ALKPHOS, BILITOT, PROT, ALBUMIN,  in the last 168 hours  Recent Labs Lab 05/21/14 1146  LIPASE 19   No results found for this basename: AMMONIA,  in the last 168 hours CBC:  Recent Labs Lab 05/21/14 1146  WBC 15.5*  NEUTROABS 12.8*  HGB 15.1  HCT 47.3  MCV 84.9  PLT 238   Cardiac Enzymes: No results found for this basename: CKTOTAL, CKMB, CKMBINDEX, TROPONINI,  in the last 168 hours BNP: No components found with this basename: POCBNP,  CBG:  Recent Labs Lab 05/21/14 1117  GLUCAP 169*     Radiological Exams on Admission: US Abdomen Complete  05/21/2014   CLINICAL DATA:  Nausea, vomiting, leukocytosis. Hydropic gallbladder with thickened wall on CT.  EXAM: ULTRASOUND ABDOMEN COMPLETE  COMPARISON:  CT abdomen and pelvis 05/21/2014. Abdominal ultrasound 10/26/2004.  FINDINGS: Examination was limited by patient body habitus.  Gallbladder:  The gallbladder wall is prominently thickened, measuring 9 mm in thickness. Debris/ sludge was noted in the lumen. No definite shadowing gallstones were identified. Sonographic Murphy's sign was negative.  Common bile duct:  Diameter: 5 mm  Liver:  Diffusely echogenic, compatible with steatosis.  IVC:  Not visualized.  Pancreas:  Not visualized.  Spleen:  Size and appearance within normal limits.  Right Kidney:  Length: 12.2 cm.  Not well  visualized.  No gross hydronephrosis.  Left Kidney:  Length: 11.0 cm. Echogenicity within normal limits. No mass or hydronephrosis visualized.  Abdominal aorta:  Lower abdominal aorta not visualized. No aneurysm identified proximally.  Other findings:  None.  IMPRESSION: 1. Prominent gallbladder thickening with internal debris/sludge. No gallstones were identified and sonographic Murphy's sign was negative, however acute acalculous cholecystitis cannot be excluded in the appropriate clinical setting. 2. Hepatic steatosis.   Electronically Signed   By: Logan Bores   On: 05/21/2014 18:13   Ct Abdomen Pelvis W Contrast  05/21/2014   CLINICAL DATA:  Hyperglycemia. Evaluate for ileus or small-bowel obstruction. Diffuse abdominal pain.  EXAM: CT ABDOMEN AND PELVIS WITH CONTRAST  TECHNIQUE: Multidetector CT imaging of the abdomen and pelvis was performed using the standard protocol following bolus administration of intravenous contrast.  CONTRAST:  1106mL OMNIPAQUE IOHEXOL 300 MG/ML  SOLN  COMPARISON:  Abdominal radiographs 05/21/2014 and CT 11/27/2013  FINDINGS: Small, chronic right pleural effusion is unchanged. Mild bibasilar  atelectasis/ scarring is again seen.  Diffusely low attenuation of the liver is again noted consistent with steatosis. The gallbladder appears mildly hydropic with evidence of wall thickening and mild fat stranding medial to it. No definite gallstones are identified. No biliary dilatation is seen. 1.5 cm cyst in the lower pole the left kidney is unchanged. The spleen, adrenal glands, right kidney, and pancreas are unremarkable.  Oral contrast is present in nondilated loops of small bowel. The colon is nondilated as well. There is no evidence of bowel obstruction. The bladder is unremarkable. A few small periportal lymph nodes are unchanged from the prior CT. There is no intraperitoneal free fluid or free air.  An infrarenal IVC filter is again seen with associated collapse of the IVC at and below the filter and with 1 of the filter's tines again noted to extend into the posterior aspect of the abdominal aorta, unchanged. Multiple collateral vessels are again noted in the anterior abdominal wall and visualized lower chest wall. Sequelae of prior laminectomies are again noted in the thoracic and lower lumbar spine.  IMPRESSION: 1. Mildly hydropic gallbladder with new wall thickening and mild surrounding inflammatory stranding. Further evaluation with right upper quadrant ultrasound is suggested to evaluate for acute cholecystitis. 2. No evidence of bowel obstruction. 3. Hepatic steatosis. 4. Small, chronic right pleural effusion.   Electronically Signed   By: Logan Bores   On: 05/21/2014 16:30   Dg Abd Acute W/chest  05/21/2014   CLINICAL DATA:  Constipation, shortness of breath.  EXAM: ACUTE ABDOMEN SERIES (ABDOMEN 2 VIEW & CHEST 1 VIEW)  COMPARISON:  10/21/2008 chest x-ray  FINDINGS: Lungs are hypoinflated with subtle density of the lateral right base likely scarring/atelectasis versus small amount of pleural fluid. There is stable cardiomegaly. Fusion hardware is present over the cervical spine unchanged. There  is mild degenerative change of the spine.  Abdominal images demonstrate air throughout the colon. There are several air-filled minimally dilated small bowel loops present. There is no free air. There are mild degenerative changes of the hips. IVC filter is in adequate position.  IMPRESSION: Several air-filled mildly dilated small bowel loops with air seen throughout the colon. Findings may be due to ileus versus early/partial small bowel obstructive process.  Hypoinflation with minimal lateral right basilar density likely scarring/atelectasis versus small amount of right pleural fluid.  Cardiomegaly.   Electronically Signed   By: Marin Olp M.D.   On: 05/21/2014 12:46    EKG: Independently reviewed.  Time spent: 60 min  Kerri-Anne Haeberle M. [facility] Pager [phone]  If 7PM-7AM, please contact night-coverage [email or web address] Password [facility] 05/21/2014, 8:05 PM

## 2014-05-21 NOTE — ED Notes (Signed)
Unable to draw blood from IV. Phlebotomy to attempt.

## 2014-05-21 NOTE — ED Notes (Signed)
Pts belongings placed in Pt belonging bag with pt label on bag.

## 2014-05-21 NOTE — ED Notes (Signed)
Per EMS pt from home- sts pt self reported CBG 526 PTAR took CBG at home and 170. Patient endorses lightheadedness and nausea denies CP, SOB, blurry vision no weakness. Pt in NAD

## 2014-05-21 NOTE — H&P (Signed)
Nicolas [name] is an 48 y.o. male.   Chief Complaint: RUQ abdominal pain, nausea, vomiting HPI: This is a 48 yo male with morbid obesity, DM2, hx of PE, that presents with a 3 day history of mid-abdominal discomfort, abdominal bloating, nausea, vomiting.  He has not had a bowel movement in 3 days.  His blood sugars have reportedly been fluctuating wildly over the last couple of days.  His pain is now localized to the RUQ.  No previous abdominal surgery.  Past Medical History  Diagnosis Date  . Arthritis   . Clotting disorder   . Hypertension   . Pulmonary embolism     x 3 in 2005, 2008, 2010  . DVT (deep venous thrombosis)     Summer 2012  . Spinal stenosis   . Depression   . Sleep apnea   . Diabetes mellitus without complication     Past Surgical History  Procedure Laterality Date  . Spine surgery  11/30/11    Total 6 back surgeries    Family History  Problem Relation Age of Onset  . Cancer Mother 49    Lung  . Hypertension Father   . Diabetes Father   . Prostate cancer Father   . Hypertension Father   . Hypertension Brother   . Hypertension Brother   . Diabetes Brother    Social History:  reports that he quit smoking about 13 years ago. His smoking use included Cigarettes. He has a 15 pack-year smoking history. He has never used smokeless tobacco. He reports that he drinks about .5 ounces of alcohol per week. He reports that he does not use illicit drugs.  Allergies:  Allergies  Allergen Reactions  . Lisinopril Other (See Comments)    cough  . Ace Inhibitors     cough    Prior to Admission medications   Medication Sig Start Date End Date Taking? Authorizing Provider  clotrimazole-betamethasone (LOTRISONE) cream Apply 1 application topically 2 (two) times daily. 02/12/14  Yes Barton Fanny, MD  diazepam (VALIUM) 10 MG tablet Take 1 tablet by mouth every 8 hours if needed for anxiety 03/19/14  Yes Barton Fanny, MD  ketoconazole (NIZORAL) 2 % shampoo  Apply 1 application topically 2 (two) times a week.   Yes Historical Provider, MD  metFORMIN (GLUCOPHAGE) 500 MG tablet Take 1 tablet (500 mg total) by mouth 2 (two) times daily with a meal. 02/12/14  Yes Barton Fanny, MD  metoprolol succinate (TOPROL-XL) 50 MG 24 hr tablet Take 1 tablet (50 mg total) by mouth daily. 02/12/14  Yes Barton Fanny, MD  multivitamin (ONE-A-DAY MEN'S) TABS tablet Take 1 tablet by mouth daily.   Yes Historical Provider, MD  potassium chloride SA (KLOR-CON M20) 20 MEQ tablet Take 1 tablet (20 mEq total) by mouth 2 (two) times daily. 02/12/14  Yes Barton Fanny, MD  sertraline (ZOLOFT) 100 MG tablet Take 1 and 1/2 tablets by mouth daily. 02/12/14  Yes Barton Fanny, MD  torsemide (DEMADEX) 20 MG tablet Take 1 tablet (20 mg total) by mouth daily. 02/12/14  Yes Barton Fanny, MD  traZODone (DESYREL) 100 MG tablet Take 1 tablet (100 mg total) by mouth at bedtime. 02/12/14  Yes Barton Fanny, MD  warfarin (COUMADIN) 10 MG tablet Take 5-10 mg by mouth daily. Take 73m on Monday, Wednesday and Friday.  All other days of the week take 189m   Yes Historical Provider, MD     Results for orders  placed during the hospital encounter of 05/21/14 (from the past 48 hour(s))  CBG MONITORING, ED     Status: Abnormal   Collection Time    05/21/14 11:17 AM      Result Value Ref Range   Glucose-Capillary 169 (*) 70 - 99 mg/dL  CBC WITH DIFFERENTIAL     Status: Abnormal   Collection Time    05/21/14 11:46 AM      Result Value Ref Range   WBC 15.5 (*) 4.0 - 10.5 K/uL   RBC 5.57  4.22 - 5.81 MIL/uL   Hemoglobin 15.1  13.0 - 17.0 g/dL   HCT 47.3  39.0 - 52.0 %   MCV 84.9  78.0 - 100.0 fL   MCH 27.1  26.0 - 34.0 pg   MCHC 31.9  30.0 - 36.0 g/dL   RDW 16.3 (*) 11.5 - 15.5 %   Platelets 238  150 - 400 K/uL   Neutrophils Relative % 83 (*) 43 - 77 %   Neutro Abs 12.8 (*) 1.7 - 7.7 K/uL   Lymphocytes Relative 6 (*) 12 - 46 %   Lymphs Abs 0.9  0.7 - 4.0  K/uL   Monocytes Relative 11  3 - 12 %   Monocytes Absolute 1.7 (*) 0.1 - 1.0 K/uL   Eosinophils Relative 0  0 - 5 %   Eosinophils Absolute 0.0  0.0 - 0.7 K/uL   Basophils Relative 0  0 - 1 %   Basophils Absolute 0.0  0.0 - 0.1 K/uL  BASIC METABOLIC PANEL     Status: Abnormal   Collection Time    05/21/14 11:46 AM      Result Value Ref Range   Sodium 135 (*) 137 - 147 mEq/L   Potassium 3.5 (*) 3.7 - 5.3 mEq/L   Chloride 93 (*) 96 - 112 mEq/L   CO2 27  19 - 32 mEq/L   Glucose, Bld 161 (*) 70 - 99 mg/dL   BUN 4 (*) 6 - 23 mg/dL   Creatinine, Ser 0.60  0.50 - 1.35 mg/dL   Calcium 9.6  8.4 - 10.5 mg/dL   GFR calc non Af Amer >90  >90 mL/min   GFR calc Af Amer >90  >90 mL/min   Comment: (NOTE)     The eGFR has been calculated using the CKD EPI equation.     This calculation has not been validated in all clinical situations.     eGFR's persistently <90 mL/min signify possible Chronic Kidney     Disease.  LIPASE, BLOOD     Status: None   Collection Time    05/21/14 11:46 AM      Result Value Ref Range   Lipase 19  11 - 59 U/L  PROTIME-INR     Status: Abnormal   Collection Time    05/21/14 11:46 AM      Result Value Ref Range   Prothrombin Time 32.4 (*) 11.6 - 15.2 seconds   INR 3.16 (*) 0.00 - 1.49  URINALYSIS, ROUTINE W REFLEX MICROSCOPIC     Status: Abnormal   Collection Time    05/21/14 11:52 AM      Result Value Ref Range   Color, Urine YELLOW  YELLOW   APPearance CLEAR  CLEAR   Specific Gravity, Urine 1.013  1.005 - 1.030   pH 8.0  5.0 - 8.0   Glucose, UA 100 (*) NEGATIVE mg/dL   Hgb urine dipstick NEGATIVE  NEGATIVE   Bilirubin Urine NEGATIVE  NEGATIVE   Ketones, ur NEGATIVE  NEGATIVE mg/dL   Protein, ur NEGATIVE  NEGATIVE mg/dL   Urobilinogen, UA 1.0  0.0 - 1.0 mg/dL   Nitrite NEGATIVE  NEGATIVE   Leukocytes, UA NEGATIVE  NEGATIVE   Comment: MICROSCOPIC NOT DONE ON URINES WITH NEGATIVE PROTEIN, BLOOD, LEUKOCYTES, NITRITE, OR GLUCOSE <1000 mg/dL.   US Abdomen  Complete  05/21/2014   CLINICAL DATA:  Nausea, vomiting, leukocytosis. Hydropic gallbladder with thickened wall on CT.  EXAM: ULTRASOUND ABDOMEN COMPLETE  COMPARISON:  CT abdomen and pelvis 05/21/2014. Abdominal ultrasound 10/26/2004.  FINDINGS: Examination was limited by patient body habitus.  Gallbladder:  The gallbladder wall is prominently thickened, measuring 9 mm in thickness. Debris/ sludge was noted in the lumen. No definite shadowing gallstones were identified. Sonographic Murphy's sign was negative.  Common bile duct:  Diameter: 5 mm  Liver:  Diffusely echogenic, compatible with steatosis.  IVC:  Not visualized.  Pancreas:  Not visualized.  Spleen:  Size and appearance within normal limits.  Right Kidney:  Length: 12.2 cm.  Not well visualized.  No gross hydronephrosis.  Left Kidney:  Length: 11.0 cm. Echogenicity within normal limits. No mass or hydronephrosis visualized.  Abdominal aorta:  Lower abdominal aorta not visualized. No aneurysm identified proximally.  Other findings:  None.  IMPRESSION: 1. Prominent gallbladder thickening with internal debris/sludge. No gallstones were identified and sonographic Murphy's sign was negative, however acute acalculous cholecystitis cannot be excluded in the appropriate clinical setting. 2. Hepatic steatosis.   Electronically Signed   By: Logan Bores   On: 05/21/2014 18:13   Ct Abdomen Pelvis W Contrast  05/21/2014   CLINICAL DATA:  Hyperglycemia. Evaluate for ileus or small-bowel obstruction. Diffuse abdominal pain.  EXAM: CT ABDOMEN AND PELVIS WITH CONTRAST  TECHNIQUE: Multidetector CT imaging of the abdomen and pelvis was performed using the standard protocol following bolus administration of intravenous contrast.  CONTRAST:  163m OMNIPAQUE IOHEXOL 300 MG/ML  SOLN  COMPARISON:  Abdominal radiographs 05/21/2014 and CT 11/27/2013  FINDINGS: Small, chronic right pleural effusion is unchanged. Mild bibasilar atelectasis/ scarring is again seen.  Diffusely low  attenuation of the liver is again noted consistent with steatosis. The gallbladder appears mildly hydropic with evidence of wall thickening and mild fat stranding medial to it. No definite gallstones are identified. No biliary dilatation is seen. 1.5 cm cyst in the lower pole the left kidney is unchanged. The spleen, adrenal glands, right kidney, and pancreas are unremarkable.  Oral contrast is present in nondilated loops of small bowel. The colon is nondilated as well. There is no evidence of bowel obstruction. The bladder is unremarkable. A few small periportal lymph nodes are unchanged from the prior CT. There is no intraperitoneal free fluid or free air.  An infrarenal IVC filter is again seen with associated collapse of the IVC at and below the filter and with 1 of the filter's tines again noted to extend into the posterior aspect of the abdominal aorta, unchanged. Multiple collateral vessels are again noted in the anterior abdominal wall and visualized lower chest wall. Sequelae of prior laminectomies are again noted in the thoracic and lower lumbar spine.  IMPRESSION: 1. Mildly hydropic gallbladder with new wall thickening and mild surrounding inflammatory stranding. Further evaluation with right upper quadrant ultrasound is suggested to evaluate for acute cholecystitis. 2. No evidence of bowel obstruction. 3. Hepatic steatosis. 4. Small, chronic right pleural effusion.   Electronically Signed   By: ALogan Bores  On: 05/21/2014 16:30  Dg Abd Acute W/chest  05/21/2014   CLINICAL DATA:  Constipation, shortness of breath.  EXAM: ACUTE ABDOMEN SERIES (ABDOMEN 2 VIEW & CHEST 1 VIEW)  COMPARISON:  10/21/2008 chest x-ray  FINDINGS: Lungs are hypoinflated with subtle density of the lateral right base likely scarring/atelectasis versus small amount of pleural fluid. There is stable cardiomegaly. Fusion hardware is present over the cervical spine unchanged. There is mild degenerative change of the spine.   Abdominal images demonstrate air throughout the colon. There are several air-filled minimally dilated small bowel loops present. There is no free air. There are mild degenerative changes of the hips. IVC filter is in adequate position.  IMPRESSION: Several air-filled mildly dilated small bowel loops with air seen throughout the colon. Findings may be due to ileus versus early/partial small bowel obstructive process.  Hypoinflation with minimal lateral right basilar density likely scarring/atelectasis versus small amount of right pleural fluid.  Cardiomegaly.   Electronically Signed   By: Marin Olp M.D.   On: 05/21/2014 12:46    Review of Systems  Constitutional: Negative for weight loss.  HENT: Negative for ear discharge, ear pain, hearing loss and tinnitus.   Eyes: Negative for blurred vision, double vision, photophobia and pain.  Respiratory: Negative for cough, sputum production and shortness of breath.   Cardiovascular: Negative for chest pain.  Gastrointestinal: Positive for nausea, vomiting and abdominal pain.  Genitourinary: Negative for dysuria, urgency, frequency and flank pain.  Musculoskeletal: Negative for back pain, falls, joint pain, myalgias and neck pain.  Neurological: Negative for dizziness, tingling, sensory change, focal weakness, loss of consciousness and headaches.  Endo/Heme/Allergies: Does not bruise/bleed easily.  Psychiatric/Behavioral: Negative for depression, memory loss and substance abuse. The patient is not nervous/anxious.     Blood pressure 179/88, pulse 108, temperature 100.3 F (37.9 C), temperature source Oral, resp. rate 20, SpO2 98.00%. Physical Exam  Obese male in NAD HEENT:  EOMI, sclera anicteric Neck:  No masses, no thyromegaly Lungs:  CTA bilaterally; normal respiratory effort CV:  Regular rate and rhythm; no murmurs Abd:  Obese, + BS, tender in RUQ; no palpable masses Ext:  Well-perfused; no edema Skin:  Warm, dry; no sign of  jaundice  Assessment/Plan 1.  Probable acute cholecystitis - based on physical examination and CT/US findings 2.  Leukocytosis probably related to #1 3.  Anticoagulated 4.  DM2 5.  HTN   Will consult [facility] to help manage his medical issues Reverse INR with Vitamin K, 2 u FFP IV antibiotics Probable lap chole this admission.    TSUEI,MATTHEW K. 05/21/2014, 7:48 PM

## 2014-05-21 NOTE — ED Provider Notes (Signed)
CSN: [id number]     Arrival date & time 05/21/14  1103 History   First MD Initiated Contact with Patient 05/21/14 1110     Chief Complaint  Patient presents with  . Hyperglycemia     (Consider location/radiation/quality/duration/timing/severity/associated sxs/prior Treatment) HPI  48 year old morbidly obese male with history of non-insulin-dependent diabetes, PE currently on warfarin, hypertension who is brought here via EMS from home for further management of his blood sugar. Patient states for the past 2-3 days he has been feeling well. Endorse low abdominal discomfort which he described as a throbbing sensation, intermittent, non radiating, with decrease in appetite. He felt nauseous yesterday and vomited several times. Vomitus is nonbloody nonbilious. He has not had a bowel movement in the past 3 days when he usually goes daily. He also noticed that yesterday morning when he checked his blood sugar was reading low, he drank root beer and when he checked it 30 minutes later there was in the 300s. He then took his diabetic medication. This morning he felt weak, tired, nauseous without vomiting. He checked his blood sugar again and it was reading 526. He did not take any medication but immediately came to ER. Otherwise patient denies fever, chills, headache, runny nose sneezing cough, chest pain, shortness of breath, back pain, dysuria, hematuria. No prior abdominal surgery. A recent travel, no recent sick exposure.  Past Medical History  Diagnosis Date  . Arthritis   . Clotting disorder   . Hypertension   . Pulmonary embolism     x 3 in 2005, 2008, 2010  . DVT (deep venous thrombosis)     Summer 2012  . Spinal stenosis   . Depression   . Sleep apnea   . Diabetes mellitus without complication    Past Surgical History  Procedure Laterality Date  . Spine surgery  11/30/11    Total 6 back surgeries   Family History  Problem Relation Age of Onset  . Cancer Mother 79    Lung  .  Hypertension Father   . Diabetes Father   . Prostate cancer Father   . Hypertension Father   . Hypertension Brother   . Hypertension Brother   . Diabetes Brother    History  Substance Use Topics  . Smoking status: Former Smoker -- 1.50 packs/day for 10 years    Types: Cigarettes    Quit date: 04/26/2001  . Smokeless tobacco: Never Used  . Alcohol Use: 0.5 oz/week    1 drink(s) per week     Comment: very little    Review of Systems  All other systems reviewed and are negative.     Allergies  Lisinopril and Ace inhibitors  Home Medications   Prior to Admission medications   Medication Sig Start Date End Date Taking? Authorizing Provider  clotrimazole-betamethasone (LOTRISONE) cream Apply 1 application topically 2 (two) times daily. 02/12/14   Barton Fanny, MD  diazepam (VALIUM) 10 MG tablet Take 1 tablet by mouth every 8 hours if needed for anxiety 03/19/14   Barton Fanny, MD  ketoconazole (NIZORAL) 2 % shampoo Apply 1 application topically daily. 02/12/14   Barton Fanny, MD  metFORMIN (GLUCOPHAGE) 500 MG tablet Take 1 tablet (500 mg total) by mouth 2 (two) times daily with a meal. 02/12/14   Barton Fanny, MD  metoprolol succinate (TOPROL-XL) 50 MG 24 hr tablet Take 1 tablet (50 mg total) by mouth daily. 02/12/14   Barton Fanny, MD  oxyCODONE (OXY IR/ROXICODONE) 5 MG  immediate release tablet Take 1 tablet (5 mg total) by mouth every 6 (six) hours as needed for moderate pain. 02/12/14   Barton Fanny, MD  potassium chloride SA (KLOR-CON M20) 20 MEQ tablet Take 1 tablet (20 mEq total) by mouth 2 (two) times daily. 02/12/14   Barton Fanny, MD  sertraline (ZOLOFT) 100 MG tablet Take 1 and 1/2 tablets by mouth daily. 02/12/14   Barton Fanny, MD  torsemide (DEMADEX) 20 MG tablet Take 1 tablet (20 mg total) by mouth daily. 02/12/14   Barton Fanny, MD  traZODone (DESYREL) 100 MG tablet Take 1 tablet (100 mg total) by mouth at  bedtime. 02/12/14   Barton Fanny, MD  warfarin (COUMADIN) 10 MG tablet Take 1 tablet (10 mg total) by mouth as directed. 02/12/14   Burnell Blanks, MD   SpO2 96% Physical Exam  Constitutional: He is oriented to person, place, and time. He appears well-developed and well-nourished. No distress.  Morbidly obese, in no apparent distress.  HENT:  Head: Atraumatic.  Mouth/Throat: Oropharynx is clear and moist.  Eyes: Conjunctivae are normal.  Neck: Normal range of motion. Neck supple.  Cardiovascular: Normal rate and regular rhythm.   Pulmonary/Chest: Effort normal and breath sounds normal.  Abdominal:  Protuberant abdomen, with mild generalized abdominal tenderness most significant to the suprapubic region without guarding or rebound tenderness. No hernia noted   Musculoskeletal: He exhibits no edema.  Neurological: He is alert and oriented to person, place, and time.  Skin: No rash noted.  Psychiatric: He has a normal mood and affect.    ED Course  Procedures (including critical care time)  11:26 AM Patient complaining of feeling fatigued, nausea vomiting and hyperglycemia. His blood sugar is mildly elevated at 160 which were checked in the ER today. He is currently in no acute distress and has a nonsurgical abdomen. Given his constipation but without any prior abdominal surgery, acute abdominal series x-ray ordered. Basic labs ordered. IV fluid given. Antinausea medication given. Will closely monitor.  12:54 PM Acute abdominal series demonstrate evidence of ileus versus early small bowel obstruction. Patient states he has been having a bowel movement in 3 days and is having difficulty passing flatus. Given his low abnormal pain and this finding, abdominal and pelvis CT scan ordered to rule out small bowel obstruction. Care discussed with Dr. Ashok Cordia.    4:40 PM Abdominal and pelvis CT scan is without evidence of small bowel obstruction. However there is a mildly hydropic  gallbladder with new wall thickening and mild surrounding inflammatory stranding. Recommend for further evaluation with an abdominal ultrasound to rule out for acute cholecystitis. Patient is complaining of nausea and vomiting however his pain is to mid abdomen and he is without evidence of positive Murphy's sign to suggest cholecystitis. However given his low-grade fever, leukocytosis, nausea vomiting and history of diabetes, abdominal ultrasound ordered to rule out gallbladder etiology. Patient is made aware of plan.  7:00 PM abd US demonstrates a prominent gallbladder thickening with internal debris/sludge but no gallstones identified.  Cannot excluded acalculous cholecystitis.  Pt report after [place] he is now having RUQ pain.  His current temp is 100.3.  Tylenol given.  Pain medication given.  I will consult general surgery for further recommendation.    7:11 PM I have consulted general surgery, Dr. Molli Posey who agrees to see pt and suggest likely HIDA scan for further evaluation.  Recommend medicine for admission.  Will consult medicine.    7:47  PM I have consulted with [facility], Dr. Alcario Drought who agrees to admit pt for further care.    Labs Review Labs Reviewed  CBC WITH DIFFERENTIAL - Abnormal; Notable for the following:    WBC 15.5 (*)    RDW 16.3 (*)    Neutrophils Relative % 83 (*)    Neutro Abs 12.8 (*)    Lymphocytes Relative 6 (*)    Monocytes Absolute 1.7 (*)    All other components within normal limits  BASIC METABOLIC PANEL - Abnormal; Notable for the following:    Sodium 135 (*)    Potassium 3.5 (*)    Chloride 93 (*)    Glucose, Bld 161 (*)    BUN 4 (*)    All other components within normal limits  URINALYSIS, ROUTINE W REFLEX MICROSCOPIC - Abnormal; Notable for the following:    Glucose, UA 100 (*)    All other components within normal limits  PROTIME-INR - Abnormal; Notable for the following:    Prothrombin Time 32.4 (*)    INR 3.16 (*)    All other components  within normal limits  CBG MONITORING, ED - Abnormal; Notable for the following:    Glucose-Capillary 169 (*)    All other components within normal limits  LIPASE, BLOOD    Imaging Review US Abdomen Complete  05/21/2014   CLINICAL DATA:  Nausea, vomiting, leukocytosis. Hydropic gallbladder with thickened wall on CT.  EXAM: ULTRASOUND ABDOMEN COMPLETE  COMPARISON:  CT abdomen and pelvis 05/21/2014. Abdominal ultrasound 10/26/2004.  FINDINGS: Examination was limited by patient body habitus.  Gallbladder:  The gallbladder wall is prominently thickened, measuring 9 mm in thickness. Debris/ sludge was noted in the lumen. No definite shadowing gallstones were identified. Sonographic Murphy's sign was negative.  Common bile duct:  Diameter: 5 mm  Liver:  Diffusely echogenic, compatible with steatosis.  IVC:  Not visualized.  Pancreas:  Not visualized.  Spleen:  Size and appearance within normal limits.  Right Kidney:  Length: 12.2 cm.  Not well visualized.  No gross hydronephrosis.  Left Kidney:  Length: 11.0 cm. Echogenicity within normal limits. No mass or hydronephrosis visualized.  Abdominal aorta:  Lower abdominal aorta not visualized. No aneurysm identified proximally.  Other findings:  None.  IMPRESSION: 1. Prominent gallbladder thickening with internal debris/sludge. No gallstones were identified and sonographic Murphy's sign was negative, however acute acalculous cholecystitis cannot be excluded in the appropriate clinical setting. 2. Hepatic steatosis.   Electronically Signed   By: Logan Bores   On: 05/21/2014 18:13   Ct Abdomen Pelvis W Contrast  05/21/2014   CLINICAL DATA:  Hyperglycemia. Evaluate for ileus or small-bowel obstruction. Diffuse abdominal pain.  EXAM: CT ABDOMEN AND PELVIS WITH CONTRAST  TECHNIQUE: Multidetector CT imaging of the abdomen and pelvis was performed using the standard protocol following bolus administration of intravenous contrast.  CONTRAST:  167mL OMNIPAQUE IOHEXOL 300  MG/ML  SOLN  COMPARISON:  Abdominal radiographs 05/21/2014 and CT 11/27/2013  FINDINGS: Small, chronic right pleural effusion is unchanged. Mild bibasilar atelectasis/ scarring is again seen.  Diffusely low attenuation of the liver is again noted consistent with steatosis. The gallbladder appears mildly hydropic with evidence of wall thickening and mild fat stranding medial to it. No definite gallstones are identified. No biliary dilatation is seen. 1.5 cm cyst in the lower pole the left kidney is unchanged. The spleen, adrenal glands, right kidney, and pancreas are unremarkable.  Oral contrast is present in nondilated loops of small bowel. The  colon is nondilated as well. There is no evidence of bowel obstruction. The bladder is unremarkable. A few small periportal lymph nodes are unchanged from the prior CT. There is no intraperitoneal free fluid or free air.  An infrarenal IVC filter is again seen with associated collapse of the IVC at and below the filter and with 1 of the filter's tines again noted to extend into the posterior aspect of the abdominal aorta, unchanged. Multiple collateral vessels are again noted in the anterior abdominal wall and visualized lower chest wall. Sequelae of prior laminectomies are again noted in the thoracic and lower lumbar spine.  IMPRESSION: 1. Mildly hydropic gallbladder with new wall thickening and mild surrounding inflammatory stranding. Further evaluation with right upper quadrant ultrasound is suggested to evaluate for acute cholecystitis. 2. No evidence of bowel obstruction. 3. Hepatic steatosis. 4. Small, chronic right pleural effusion.   Electronically Signed   By: Logan Bores   On: 05/21/2014 16:30   Dg Abd Acute W/chest  05/21/2014   CLINICAL DATA:  Constipation, shortness of breath.  EXAM: ACUTE ABDOMEN SERIES (ABDOMEN 2 VIEW & CHEST 1 VIEW)  COMPARISON:  10/21/2008 chest x-ray  FINDINGS: Lungs are hypoinflated with subtle density of the lateral right base likely  scarring/atelectasis versus small amount of pleural fluid. There is stable cardiomegaly. Fusion hardware is present over the cervical spine unchanged. There is mild degenerative change of the spine.  Abdominal images demonstrate air throughout the colon. There are several air-filled minimally dilated small bowel loops present. There is no free air. There are mild degenerative changes of the hips. IVC filter is in adequate position.  IMPRESSION: Several air-filled mildly dilated small bowel loops with air seen throughout the colon. Findings may be due to ileus versus early/partial small bowel obstructive process.  Hypoinflation with minimal lateral right basilar density likely scarring/atelectasis versus small amount of right pleural fluid.  Cardiomegaly.   Electronically Signed   By: Marin Olp M.D.   On: 05/21/2014 12:46     EKG Interpretation None      MDM   Final diagnoses:  Non-intractable vomiting with nausea, vomiting of unspecified type  Gallbladder dilatation  Supratherapeutic INR  Leukocytosis    BP 179/88  Pulse 108  Temp(Src) 100.3 F (37.9 C) (Oral)  Resp 20  SpO2 98%  I have reviewed nursing notes and vital signs. I personally reviewed the imaging tests through PACS system  I reviewed available ER/hospitalization records thought the EMR     Nicolas [name], [place] 05/21/14 1948

## 2014-05-21 NOTE — ED Notes (Signed)
Patient transported to CT 

## 2014-05-22 ENCOUNTER — Inpatient Hospital Stay (HOSPITAL_COMMUNITY): Payer: Medicare HMO

## 2014-05-22 DIAGNOSIS — Z86711 Personal history of pulmonary embolism: Secondary | ICD-10-CM

## 2014-05-22 DIAGNOSIS — I10 Essential (primary) hypertension: Secondary | ICD-10-CM

## 2014-05-22 LAB — SURGICAL PCR SCREEN
MRSA, PCR: NEGATIVE
Staphylococcus aureus: NEGATIVE

## 2014-05-22 LAB — CBC
HCT: 42.8 % (ref 39.0–52.0)
Hemoglobin: 13.9 g/dL (ref 13.0–17.0)
MCH: 26.9 pg (ref 26.0–34.0)
MCHC: 32.5 g/dL (ref 30.0–36.0)
MCV: 82.8 fL (ref 78.0–100.0)
Platelets: 216 10*3/uL (ref 150–400)
RBC: 5.17 MIL/uL (ref 4.22–5.81)
RDW: 16.4 % — ABNORMAL HIGH (ref 11.5–15.5)
WBC: 21.2 10*3/uL — ABNORMAL HIGH (ref 4.0–10.5)

## 2014-05-22 LAB — GLUCOSE, CAPILLARY
Glucose-Capillary: 126 mg/dL — ABNORMAL HIGH (ref 70–99)
Glucose-Capillary: 141 mg/dL — ABNORMAL HIGH (ref 70–99)
Glucose-Capillary: 147 mg/dL — ABNORMAL HIGH (ref 70–99)
Glucose-Capillary: 153 mg/dL — ABNORMAL HIGH (ref 70–99)
Glucose-Capillary: 153 mg/dL — ABNORMAL HIGH (ref 70–99)
Glucose-Capillary: 159 mg/dL — ABNORMAL HIGH (ref 70–99)

## 2014-05-22 LAB — COMPREHENSIVE METABOLIC PANEL
ALT: 115 U/L — ABNORMAL HIGH (ref 0–53)
AST: 126 U/L — ABNORMAL HIGH (ref 0–37)
Albumin: 3.3 g/dL — ABNORMAL LOW (ref 3.5–5.2)
Alkaline Phosphatase: 115 U/L (ref 39–117)
BUN: 5 mg/dL — ABNORMAL LOW (ref 6–23)
CO2: 26 mEq/L (ref 19–32)
Calcium: 9.4 mg/dL (ref 8.4–10.5)
Chloride: 96 mEq/L (ref 96–112)
Creatinine, Ser: 0.58 mg/dL (ref 0.50–1.35)
GFR calc Af Amer: >90 mL/min (ref >90)
GFR calc non Af Amer: >90 mL/min (ref >90)
Glucose, Bld: 146 mg/dL — ABNORMAL HIGH (ref 70–99)
Potassium: 3.7 mEq/L (ref 3.7–5.3)
Sodium: 137 mEq/L (ref 137–147)
Total Bilirubin: 4.1 mg/dL — ABNORMAL HIGH (ref 0.3–1.2)
Total Protein: 8.1 g/dL (ref 6.0–8.3)

## 2014-05-22 LAB — HEMOGLOBIN A1C
Hgb A1c MFr Bld: 6 % — ABNORMAL HIGH (ref <5.7)
Mean Plasma Glucose: 126 mg/dL — ABNORMAL HIGH (ref <117)

## 2014-05-22 LAB — HEPARIN LEVEL (UNFRACTIONATED): Heparin Unfractionated: <0.1 IU/mL — ABNORMAL LOW (ref 0.30–0.70)

## 2014-05-22 LAB — PROTIME-INR
INR: 1.76 — ABNORMAL HIGH (ref 0.00–1.49)
Prothrombin Time: 20.5 seconds — ABNORMAL HIGH (ref 11.6–15.2)

## 2014-05-22 LAB — LIPASE, BLOOD: Lipase: 13 U/L (ref 11–59)

## 2014-05-22 MED ORDER — MENTHOL 3 MG MT LOZG
1.0000 | LOZENGE | OROMUCOSAL | Status: DC | PRN
  Filled 2014-05-22: qty 9

## 2014-05-22 MED ORDER — VITAMIN K1 10 MG/ML IJ SOLN
10.0000 mg | Freq: Once | INTRAVENOUS | Status: AC
  Administered 2014-05-22: 10 mg via INTRAVENOUS
  Filled 2014-05-22: qty 1

## 2014-05-22 MED ORDER — HEPARIN (PORCINE) IN NACL 100-0.45 UNIT/ML-% IJ SOLN
2300.0000 [IU]/h | INTRAMUSCULAR | Status: AC
  Administered 2014-05-22: 1950 [IU]/h via INTRAVENOUS
  Filled 2014-05-22 (×2): qty 250

## 2014-05-22 NOTE — Progress Notes (Signed)
ANTICOAGULATION CONSULT NOTE - Initial Consult  Pharmacy Consult for Heparin Indication: h/o multiple VTEs  Allergies  Allergen Reactions  . Lisinopril Other (See Comments)    cough  . Ace Inhibitors     cough    Patient Measurements: Height: 5\' 11"  (180.3 cm) Weight: 315 lb 4.1 oz (143 kg) IBW/kg (Calculated) : 75.3 Heparin Dosing Weight:  108.7 kg  Vital Signs: Temp: 97.3 F (36.3 C) (06/27 1409) Temp src: Oral (06/27 1409) BP: 153/73 mmHg (06/27 1409) Pulse Rate: 127 (06/27 1409)  Labs:  Recent Labs  05/21/14 1146 05/22/14 0445  HGB 15.1 13.9  HCT 47.3 42.8  PLT 238 216  LABPROT 32.4* 20.5*  INR 3.16* 1.76*  CREATININE 0.60 0.58    Estimated Creatinine Clearance: 165.3 ml/min (by C-G formula based on Cr of 0.58).   Medical History: Past Medical History  Diagnosis Date  . Arthritis   . Clotting disorder   . Hypertension   . Pulmonary embolism     x 3 in 2005, 2008, 2010  . DVT (deep venous thrombosis)     Summer 2012  . Spinal stenosis   . Depression   . Sleep apnea   . Diabetes mellitus without complication     Medications:  Prescriptions prior to admission  Medication Sig Dispense Refill  . clotrimazole-betamethasone (LOTRISONE) cream Apply 1 application topically 2 (two) times daily.  45 g  3  . diazepam (VALIUM) 10 MG tablet Take 1 tablet by mouth every 8 hours if needed for anxiety  90 tablet  1  . ketoconazole (NIZORAL) 2 % shampoo Apply 1 application topically 2 (two) times a week.      . metFORMIN (GLUCOPHAGE) 500 MG tablet Take 1 tablet (500 mg total) by mouth 2 (two) times daily with a meal.  60 tablet  5  . metoprolol succinate (TOPROL-XL) 50 MG 24 hr tablet Take 1 tablet (50 mg total) by mouth daily.  30 tablet  5  . multivitamin (ONE-A-DAY MEN'S) TABS tablet Take 1 tablet by mouth daily.      . potassium chloride SA (KLOR-CON M20) 20 MEQ tablet Take 1 tablet (20 mEq total) by mouth 2 (two) times daily.  60 tablet  2  . sertraline  (ZOLOFT) 100 MG tablet Take 1 and 1/2 tablets by mouth daily.  45 tablet  5  . torsemide (DEMADEX) 20 MG tablet Take 1 tablet (20 mg total) by mouth daily.  30 tablet  5  . traZODone (DESYREL) 100 MG tablet Take 1 tablet (100 mg total) by mouth at bedtime.  30 tablet  5  . warfarin (COUMADIN) 10 MG tablet Take 5-10 mg by mouth daily. Take 5mg  on Monday, Wednesday and Friday.  All other days of the week take 10mg .       Admit Complaint:  Acute acalculous cholecystitis   Anticoagulation: h/o mult.VTEs: INR 3.16>>1.72 s/p IV Vitamin K 10mg . Start IV heparin bridge. CBC WNL  Infectious Disease: Tmax 100.3. WBC 15.5>>21.2 overnight. +Zosyn  Cardiovascular: HTN. VSS on Troplol XL and Demadex  Endocrinology: DM on SSI. CBGs 141-179  Gastrointestinal / Nutrition: IV PPI  Neurology: spinal stenosis (back surgery x 6), depression  on Zoloft  Nephrology: CrCl >100  Pulmonary: OSA  Hematology / Oncology  PTA Medication Issues  Best Practices: PPI, IV heparin  Goal of Therapy:  Heparin level 0.3-0.7 units/ml Monitor platelets by anticoagulation protocol: Yes   Plan:  Anticipate cholecystectomy in AM Start IV heparin (no bolus) at 1950 units/hr  Check heparin level in 6 hrs and daily  [name] S. Alford Highland, PharmD, BCPS Clinical Staff Pharmacist Pager [phone]  Eilene Ghazi Stillinger 05/22/2014,2:52 PM

## 2014-05-22 NOTE — Progress Notes (Signed)
Acute acalculous cholecystitis  Subjective: Pt feels better, still having RUQ pain, nausea better  Objective: Vital signs in last 24 hours: Temp:  [97.6 F (36.4 C)-100.3 F (37.9 C)] 99.7 F (37.6 C) (06/27 0402) Pulse Rate:  [87-121] 118 (06/27 0402) Resp:  [16-45] 18 (06/27 0402) BP: (133-179)/(51-99) 151/77 mmHg (06/27 0402) SpO2:  [93 %-99 %] 98 % (06/27 0402) Weight:  [315 lb 4.1 oz (143 kg)] 315 lb 4.1 oz (143 kg) (06/26 2106) Last BM Date: 05/20/14 (per pt)  Intake/Output from previous day: 06/26 0701 - 06/27 0700 In: 1634.4 [P.O.:100; I.V.:860.4; Blood:674] Out: 1875 [LJQGB:2010] Intake/Output this shift:   General appearance: alert and cooperative, no distress GI: normal findings: tender to palpation in RUQ, no signs of peritonitis   Lab Results:  Results for orders placed during the hospital encounter of 05/21/14 (from the past 24 hour(s))  CBG MONITORING, ED     Status: Abnormal   Collection Time    05/21/14 11:17 AM      Result Value Ref Range   Glucose-Capillary 169 (*) 70 - 99 mg/dL  CBC WITH DIFFERENTIAL     Status: Abnormal   Collection Time    05/21/14 11:46 AM      Result Value Ref Range   WBC 15.5 (*) 4.0 - 10.5 K/uL   RBC 5.57  4.22 - 5.81 MIL/uL   Hemoglobin 15.1  13.0 - 17.0 g/dL   HCT 47.3  39.0 - 52.0 %   MCV 84.9  78.0 - 100.0 fL   MCH 27.1  26.0 - 34.0 pg   MCHC 31.9  30.0 - 36.0 g/dL   RDW 16.3 (*) 11.5 - 15.5 %   Platelets 238  150 - 400 K/uL   Neutrophils Relative % 83 (*) 43 - 77 %   Neutro Abs 12.8 (*) 1.7 - 7.7 K/uL   Lymphocytes Relative 6 (*) 12 - 46 %   Lymphs Abs 0.9  0.7 - 4.0 K/uL   Monocytes Relative 11  3 - 12 %   Monocytes Absolute 1.7 (*) 0.1 - 1.0 K/uL   Eosinophils Relative 0  0 - 5 %   Eosinophils Absolute 0.0  0.0 - 0.7 K/uL   Basophils Relative 0  0 - 1 %   Basophils Absolute 0.0  0.0 - 0.1 K/uL  BASIC METABOLIC PANEL     Status: Abnormal   Collection Time    05/21/14 11:46 AM      Result Value Ref Range   Sodium 135 (*) 137 - 147 mEq/L   Potassium 3.5 (*) 3.7 - 5.3 mEq/L   Chloride 93 (*) 96 - 112 mEq/L   CO2 27  19 - 32 mEq/L   Glucose, Bld 161 (*) 70 - 99 mg/dL   BUN 4 (*) 6 - 23 mg/dL   Creatinine, Ser 0.60  0.50 - 1.35 mg/dL   Calcium 9.6  8.4 - 10.5 mg/dL   GFR calc non Af Amer >90  >90 mL/min   GFR calc Af Amer >90  >90 mL/min  LIPASE, BLOOD     Status: None   Collection Time    05/21/14 11:46 AM      Result Value Ref Range   Lipase 19  11 - 59 U/L  PROTIME-INR     Status: Abnormal   Collection Time    05/21/14 11:46 AM      Result Value Ref Range   Prothrombin Time 32.4 (*) 11.6 - 15.2 seconds   INR 3.16 (*)  0.00 - 1.49  URINALYSIS, ROUTINE W REFLEX MICROSCOPIC     Status: Abnormal   Collection Time    05/21/14 11:52 AM      Result Value Ref Range   Color, Urine YELLOW  YELLOW   APPearance CLEAR  CLEAR   Specific Gravity, Urine 1.013  1.005 - 1.030   pH 8.0  5.0 - 8.0   Glucose, UA 100 (*) NEGATIVE mg/dL   Hgb urine dipstick NEGATIVE  NEGATIVE   Bilirubin Urine NEGATIVE  NEGATIVE   Ketones, ur NEGATIVE  NEGATIVE mg/dL   Protein, ur NEGATIVE  NEGATIVE mg/dL   Urobilinogen, UA 1.0  0.0 - 1.0 mg/dL   Nitrite NEGATIVE  NEGATIVE   Leukocytes, UA NEGATIVE  NEGATIVE  CBG MONITORING, ED     Status: Abnormal   Collection Time    05/21/14  8:17 PM      Result Value Ref Range   Glucose-Capillary 179 (*) 70 - 99 mg/dL  ABO/RH     Status: None   Collection Time    05/21/14  9:15 PM      Result Value Ref Range   ABO/RH(D) A POS    PREPARE FRESH FROZEN PLASMA     Status: None   Collection Time    05/21/14 10:00 PM      Result Value Ref Range   Unit Number [id number]     Blood Component Type THAWED PLASMA     Unit division 00     Status of Unit ISSUED     Transfusion Status OK TO TRANSFUSE     Unit Number [id number]     Blood Component Type THAWED PLASMA     Unit division 00     Status of Unit ISSUED     Transfusion Status OK TO TRANSFUSE    GLUCOSE, CAPILLARY      Status: Abnormal   Collection Time    05/21/14 11:46 PM      Result Value Ref Range   Glucose-Capillary 191 (*) 70 - 99 mg/dL  GLUCOSE, CAPILLARY     Status: Abnormal   Collection Time    05/22/14  3:56 AM      Result Value Ref Range   Glucose-Capillary 153 (*) 70 - 99 mg/dL  COMPREHENSIVE METABOLIC PANEL     Status: Abnormal   Collection Time    05/22/14  4:45 AM      Result Value Ref Range   Sodium 137  137 - 147 mEq/L   Potassium 3.7  3.7 - 5.3 mEq/L   Chloride 96  96 - 112 mEq/L   CO2 26  19 - 32 mEq/L   Glucose, Bld 146 (*) 70 - 99 mg/dL   BUN 5 (*) 6 - 23 mg/dL   Creatinine, Ser 0.58  0.50 - 1.35 mg/dL   Calcium 9.4  8.4 - 10.5 mg/dL   Total Protein 8.1  6.0 - 8.3 g/dL   Albumin 3.3 (*) 3.5 - 5.2 g/dL   AST 126 (*) 0 - 37 U/L   ALT 115 (*) 0 - 53 U/L   Alkaline Phosphatase 115  39 - 117 U/L   Total Bilirubin 4.1 (*) 0.3 - 1.2 mg/dL   GFR calc non Af Amer >90  >90 mL/min   GFR calc Af Amer >90  >90 mL/min  CBC     Status: Abnormal   Collection Time    05/22/14  4:45 AM      Result Value Ref Range   WBC  21.2 (*) 4.0 - 10.5 K/uL   RBC 5.17  4.22 - 5.81 MIL/uL   Hemoglobin 13.9  13.0 - 17.0 g/dL   HCT 42.8  39.0 - 52.0 %   MCV 82.8  78.0 - 100.0 fL   MCH 26.9  26.0 - 34.0 pg   MCHC 32.5  30.0 - 36.0 g/dL   RDW 16.4 (*) 11.5 - 15.5 %   Platelets 216  150 - 400 K/uL  PROTIME-INR     Status: Abnormal   Collection Time    05/22/14  4:45 AM      Result Value Ref Range   Prothrombin Time 20.5 (*) 11.6 - 15.2 seconds   INR 1.76 (*) 0.00 - 1.49  GLUCOSE, CAPILLARY     Status: Abnormal   Collection Time    05/22/14  7:58 AM      Result Value Ref Range   Glucose-Capillary 141 (*) 70 - 99 mg/dL     Studies/Results Radiology     MEDS, Scheduled . insulin aspart  0-20 Units Subcutaneous 6 times per day  . metoprolol succinate  50 mg Oral Daily  . pantoprazole (PROTONIX) IV  40 mg Intravenous QHS  . piperacillin-tazobactam (ZOSYN)  IV  3.375 g Intravenous 3  times per day  . sertraline  100 mg Oral Daily  . torsemide  20 mg Oral Daily  . traZODone  100 mg Oral QHS     Assessment: Acute acalculous cholecystitis   Plan: Cont Zosyn, NPO and IVF's Recheck labs in AM Anticipate cholecystectomy in AM  LOS: 1 day    Rosario Adie, MD [facility], [place] [phone]   05/22/2014 9:32 AM

## 2014-05-22 NOTE — Progress Notes (Signed)
PROGRESS NOTE  Nicolas [name] KDX:[id number] DOB: Jul 31, 1966 DOA: 05/21/2014 PCP: Ellsworth Lennox, MD  Assessment/Plan: Acute acalculous cholecystitis -RUQ u/s--gallbladder wall thickening with debris and sludge in gallbladder lumen -CT abdomen and pelvis negative for bowel obstruction but shows hydropic gallbladder with wall thickening and inflammatory stranding -Continue Zosyn -management per surgery History of recurrent PE -Patient given vitamin K x 2 -Start patient on heparin drip pending surgical intervention -Spoke with surgery service to update them -pt with IVC filter  -Restart Coumadin when cleared by surgery after cholecystectomy Diabetes mellitus type 2 -02/12/2014 hemoglobin A1c 6.6 -Discontinue metformin during her inpatient hospitalization -NovoLog sliding scale every 4 hours -Recheck hemoglobin A1c -CBGs controlled Hypertension -Continue metoprolol succinate -Elevated blood pressure partly due to uncontrolled pain Tachycardia -Likely due to pain -Continue metoprolol succinate -EKG Hypoxemia -Likely due to hypoventilation from splinted breathing from pain -order CXR Hx of tobacco use -quit 13 years ago Depression -continue zoloft   Family Communication:   daughter at beside Disposition Plan:   Home when medically stable   Antibiotics:  Zosyn 6/26-->    Procedures/Studies: Mr Thoracic Spine W Wo Contrast  05/14/2014   CLINICAL DATA:  History of T6-8 decompression. Paraplegia and incontinence.  EXAM: MRI THORACIC SPINE WITHOUT AND WITH CONTRAST  TECHNIQUE: Multiplanar and multiecho pulse sequences of the thoracic spine were obtained without and with intravenous contrast.  CONTRAST:  1mL MULTIHANCE GADOBENATE DIMEGLUMINE 529 MG/ML IV SOLN  COMPARISON:  MRI thoracic spine 12/03/2013.  FINDINGS: Multilevel Schmorl's nodes are again seen. The patient status post extensive posterior decompression as detailed in a low. Focal edema within the  thoracic cord at T8-9 is again identified and does not appear markedly changed. There may also be a focus of cord signal abnormality at the T6-7 level is may be new since the prior exam. Thoracic cord signal is otherwise unremarkable.  Since the prior examination, it appears the patient has undergone revision of decompression from T6-7 to approximately T7-8 since the prior examination. There is a fluid collection in the midline of the back tracking along the laminectomy sites. The collection measures 10.3 cm craniocaudal with its largest component in the axial plane at T6-7 where it measures 2.3 by 0.9 cm. There is a smaller collection the deep subcutaneous tissues measures 7.9 cm craniocaudal by up to 1.4 cm AP by 0.4 cm transverse. These collections likely represent seromas.  C7-T1: Shallow central protrusion indents the ventral thecal sac without obvious central canal or foraminal stenosis.  T1-2: No change in a shallow disc bulge without central canal or foraminal narrowing.  T2-3: There is facet arthropathy and ligamentum flavum thickening. The central canal and foramina appear open.  T3-4: Status post posterior decompression. There is facet degenerative disease. The central canal and foramina appear open.  T4-5: Status post posterior decompression. Central canal and foramina appear open. Small T2 hyperintense collection eccentric to the right is seen in the sagittal plane only measuring 0.7 cm craniocaudal. This may be a synovial cyst off the right facet joint. It is unchanged.  T5-6: Status post posterior decompression. Facet arthropathy is present. The central canal and foramina appear open.  T6-7: The patient is status post decompression. Facet arthropathy and ligamentum flavum thickening appear unchanged. Facet arthropathy is noted.  T7-8: Revision of posterior decompression since the prior exam. Central canal stenosis appears improved although ligamentum flavum thickening and facet arthropathy continue to  deform the cord. Foramina are open.  T8-9: Status  post decompression. The central canal and foramina appear open.  T9-10: Status post posterior decompression. The central canal and foramina appear open.  T10-11: Status post posterior decompression. There is ligamentum flavum thickening and facet arthropathy. The central canal appears open. Foramina appear narrowed. This level is stable.  T11-12: Ligamentum flavum thickening and facet arthropathy are identified. Central canal stenosis appears unchanged.  T12-L1: This level is imaged in the sagittal plane only and unremarkable.  IMPRESSION: The patient is status post interval revision of posterior decompression at T7-8. The appearance of the T7-8 level is improved although facet arthropathy and ligamentum flavum thickening continue to deform the cord.  Possible subtle focus of edema within the cord at T6-7 mid and present on the prior study but is better seen today.  Fluid collection extending from mid T6 to the T9-10 level likely represents a postoperative seroma as detailed above. A second more superficial collection also likely represents postoperative seroma. CSF leak or abscess are less likely but cannot be excluded.  Except as described above, the patient's examination is unchanged with edema again seen within the cord at T8-9 and multilevel posterior decompression identified.   Electronically Signed   By: Inge Rise M.D.   On: 05/14/2014 16:49   US Abdomen Complete  05/21/2014   CLINICAL DATA:  Nausea, vomiting, leukocytosis. Hydropic gallbladder with thickened wall on CT.  EXAM: ULTRASOUND ABDOMEN COMPLETE  COMPARISON:  CT abdomen and pelvis 05/21/2014. Abdominal ultrasound 10/26/2004.  FINDINGS: Examination was limited by patient body habitus.  Gallbladder:  The gallbladder wall is prominently thickened, measuring 9 mm in thickness. Debris/ sludge was noted in the lumen. No definite shadowing gallstones were identified. Sonographic Murphy's sign was  negative.  Common bile duct:  Diameter: 5 mm  Liver:  Diffusely echogenic, compatible with steatosis.  IVC:  Not visualized.  Pancreas:  Not visualized.  Spleen:  Size and appearance within normal limits.  Right Kidney:  Length: 12.2 cm.  Not well visualized.  No gross hydronephrosis.  Left Kidney:  Length: 11.0 cm. Echogenicity within normal limits. No mass or hydronephrosis visualized.  Abdominal aorta:  Lower abdominal aorta not visualized. No aneurysm identified proximally.  Other findings:  None.  IMPRESSION: 1. Prominent gallbladder thickening with internal debris/sludge. No gallstones were identified and sonographic Murphy's sign was negative, however acute acalculous cholecystitis cannot be excluded in the appropriate clinical setting. 2. Hepatic steatosis.   Electronically Signed   By: Logan Bores   On: 05/21/2014 18:13   Ct Abdomen Pelvis W Contrast  05/21/2014   CLINICAL DATA:  Hyperglycemia. Evaluate for ileus or small-bowel obstruction. Diffuse abdominal pain.  EXAM: CT ABDOMEN AND PELVIS WITH CONTRAST  TECHNIQUE: Multidetector CT imaging of the abdomen and pelvis was performed using the standard protocol following bolus administration of intravenous contrast.  CONTRAST:  153mL OMNIPAQUE IOHEXOL 300 MG/ML  SOLN  COMPARISON:  Abdominal radiographs 05/21/2014 and CT 11/27/2013  FINDINGS: Small, chronic right pleural effusion is unchanged. Mild bibasilar atelectasis/ scarring is again seen.  Diffusely low attenuation of the liver is again noted consistent with steatosis. The gallbladder appears mildly hydropic with evidence of wall thickening and mild fat stranding medial to it. No definite gallstones are identified. No biliary dilatation is seen. 1.5 cm cyst in the lower pole the left kidney is unchanged. The spleen, adrenal glands, right kidney, and pancreas are unremarkable.  Oral contrast is present in nondilated loops of small bowel. The colon is nondilated as well. There is no evidence of bowel  obstruction. The bladder is unremarkable. A few small periportal lymph nodes are unchanged from the prior CT. There is no intraperitoneal free fluid or free air.  An infrarenal IVC filter is again seen with associated collapse of the IVC at and below the filter and with 1 of the filter's tines again noted to extend into the posterior aspect of the abdominal aorta, unchanged. Multiple collateral vessels are again noted in the anterior abdominal wall and visualized lower chest wall. Sequelae of prior laminectomies are again noted in the thoracic and lower lumbar spine.  IMPRESSION: 1. Mildly hydropic gallbladder with new wall thickening and mild surrounding inflammatory stranding. Further evaluation with right upper quadrant ultrasound is suggested to evaluate for acute cholecystitis. 2. No evidence of bowel obstruction. 3. Hepatic steatosis. 4. Small, chronic right pleural effusion.   Electronically Signed   By: Logan Bores   On: 05/21/2014 16:30   Dg Abd Acute W/chest  05/21/2014   CLINICAL DATA:  Constipation, shortness of breath.  EXAM: ACUTE ABDOMEN SERIES (ABDOMEN 2 VIEW & CHEST 1 VIEW)  COMPARISON:  10/21/2008 chest x-ray  FINDINGS: Lungs are hypoinflated with subtle density of the lateral right base likely scarring/atelectasis versus small amount of pleural fluid. There is stable cardiomegaly. Fusion hardware is present over the cervical spine unchanged. There is mild degenerative change of the spine.  Abdominal images demonstrate air throughout the colon. There are several air-filled minimally dilated small bowel loops present. There is no free air. There are mild degenerative changes of the hips. IVC filter is in adequate position.  IMPRESSION: Several air-filled mildly dilated small bowel loops with air seen throughout the colon. Findings may be due to ileus versus early/partial small bowel obstructive process.  Hypoinflation with minimal lateral right basilar density likely scarring/atelectasis versus  small amount of right pleural fluid.  Cardiomegaly.   Electronically Signed   By: Marin Olp M.D.   On: 05/21/2014 12:46         Subjective: Patient continues to complain of right upper quadrant abdominal pain worsening with deep inspiration. Denies fevers, chills, chest pain, shortness of breath, vomiting, diarrhea, dysuria, hematuria. He has some nausea. No headaches or rashes.  Objective: Filed Vitals:   05/22/14 0110 05/22/14 0402 05/22/14 1020 05/22/14 1409  BP: 151/91 151/77 149/89 153/73  Pulse: 109 118 124 127  Temp: 98.1 F (36.7 C) 99.7 F (37.6 C) 97.9 F (36.6 C) 97.3 F (36.3 C)  TempSrc: Oral Oral Oral Oral  Resp: 16 18 18 20   Height:      Weight:      SpO2: 98% 98% 91% 93%    Intake/Output Summary (Last 24 hours) at 05/22/14 1451 Last data filed at 05/22/14 1432  Gross per 24 hour  Intake 2813.55 ml  Output   2200 ml  Net 613.55 ml   Weight change:  Exam:   General:  Pt is alert, follows commands appropriately, not in acute distress  HEENT: No icterus, No thrush,  [place]/AT  Cardiovascular: RRR, S1/S2, no rubs, no gallops  Respiratory: Bibasilar crackles. No wheezing. Good air movement  Abdomen: Soft/+BS, RUQ tender without peritoneal sign;  non distended, no guarding  Extremities: 1+LE edema, No lymphangitis, No petechiae, No rashes, no synovitis  Data Reviewed: Basic Metabolic Panel:  Recent Labs Lab 05/21/14 1146 05/22/14 0445  NA 135* 137  K 3.5* 3.7  CL 93* 96  CO2 27 26  GLUCOSE 161* 146*  BUN 4* 5*  CREATININE 0.60 0.58  CALCIUM 9.6 9.4  Liver Function Tests:  Recent Labs Lab 05/22/14 0445  AST 126*  ALT 115*  ALKPHOS 115  BILITOT 4.1*  PROT 8.1  ALBUMIN 3.3*    Recent Labs Lab 05/21/14 1146 05/22/14 0445  LIPASE 19 13   No results found for this basename: AMMONIA,  in the last 168 hours CBC:  Recent Labs Lab 05/21/14 1146 05/22/14 0445  WBC 15.5* 21.2*  NEUTROABS 12.8*  --   HGB 15.1 13.9  HCT 47.3  42.8  MCV 84.9 82.8  PLT 238 216   Cardiac Enzymes: No results found for this basename: CKTOTAL, CKMB, CKMBINDEX, TROPONINI,  in the last 168 hours BNP: No components found with this basename: POCBNP,  CBG:  Recent Labs Lab 05/21/14 2017 05/21/14 2346 05/22/14 0356 05/22/14 0758 05/22/14 1219  GLUCAP 179* 191* 153* 141* 159*    Recent Results (from the past 240 hour(s))  SURGICAL PCR SCREEN     Status: None   Collection Time    05/22/14 10:20 AM      Result Value Ref Range Status   MRSA, PCR NEGATIVE  NEGATIVE Final   Staphylococcus aureus NEGATIVE  NEGATIVE Final   Comment:            The Xpert SA Assay (FDA     approved for NASAL specimens     in patients over 43 years of age),     is one component of     a comprehensive surveillance     program.  Test performance has     been validated by [employer] for patients greater     than or equal to 51 year old.     It is not intended     to diagnose infection nor to     guide or monitor treatment.     Scheduled Meds: . insulin aspart  0-20 Units Subcutaneous 6 times per day  . metoprolol succinate  50 mg Oral Daily  . pantoprazole (PROTONIX) IV  40 mg Intravenous QHS  . piperacillin-tazobactam (ZOSYN)  IV  3.375 g Intravenous 3 times per day  . sertraline  100 mg Oral Daily  . torsemide  20 mg Oral Daily  . traZODone  100 mg Oral QHS   Continuous Infusions: . 0.45 % NaCl with KCl 20 mEq / L 125 mL/hr at 05/22/14 0418     TAT, DAVID, DO  [facility] Pager [phone]  If 7PM-7AM, please contact night-coverage [email or web address] Password [email or web address] 05/22/2014, 2:51 PM   LOS: 1 day

## 2014-05-23 DIAGNOSIS — R0902 Hypoxemia: Secondary | ICD-10-CM

## 2014-05-23 DIAGNOSIS — J984 Other disorders of lung: Secondary | ICD-10-CM

## 2014-05-23 HISTORY — DX: Hypoxemia: R09.02

## 2014-05-23 LAB — GLUCOSE, CAPILLARY
Glucose-Capillary: 122 mg/dL — ABNORMAL HIGH (ref 70–99)
Glucose-Capillary: 124 mg/dL — ABNORMAL HIGH (ref 70–99)
Glucose-Capillary: 107 mg/dL — ABNORMAL HIGH (ref 70–99)
Glucose-Capillary: 102 mg/dL — ABNORMAL HIGH (ref 70–99)
Glucose-Capillary: 92 mg/dL (ref 70–99)

## 2014-05-23 LAB — CBC
HCT: 40.7 % (ref 39.0–52.0)
Hemoglobin: 13.1 g/dL (ref 13.0–17.0)
MCH: 27 pg (ref 26.0–34.0)
MCHC: 32.2 g/dL (ref 30.0–36.0)
MCV: 83.7 fL (ref 78.0–100.0)
Platelets: 197 10*3/uL (ref 150–400)
RBC: 4.86 MIL/uL (ref 4.22–5.81)
RDW: 16.7 % — ABNORMAL HIGH (ref 11.5–15.5)
WBC: 18.3 10*3/uL — ABNORMAL HIGH (ref 4.0–10.5)

## 2014-05-23 LAB — PROTIME-INR
INR: 1.26 (ref 0.00–1.49)
Prothrombin Time: 15.8 seconds — ABNORMAL HIGH (ref 11.6–15.2)

## 2014-05-23 LAB — PREPARE FRESH FROZEN PLASMA
Unit division: 0
Unit division: 0

## 2014-05-23 LAB — COMPREHENSIVE METABOLIC PANEL
ALT: 110 U/L — ABNORMAL HIGH (ref 0–53)
AST: 83 U/L — ABNORMAL HIGH (ref 0–37)
Albumin: 2.6 g/dL — ABNORMAL LOW (ref 3.5–5.2)
Alkaline Phosphatase: 113 U/L (ref 39–117)
BUN: 12 mg/dL (ref 6–23)
CO2: 23 mEq/L (ref 19–32)
Calcium: 8.8 mg/dL (ref 8.4–10.5)
Chloride: 92 mEq/L — ABNORMAL LOW (ref 96–112)
Creatinine, Ser: 0.73 mg/dL (ref 0.50–1.35)
GFR calc Af Amer: >90 mL/min (ref >90)
GFR calc non Af Amer: >90 mL/min (ref >90)
Glucose, Bld: 115 mg/dL — ABNORMAL HIGH (ref 70–99)
Potassium: 4 mEq/L (ref 3.7–5.3)
Sodium: 132 mEq/L — ABNORMAL LOW (ref 137–147)
Total Bilirubin: 3.7 mg/dL — ABNORMAL HIGH (ref 0.3–1.2)
Total Protein: 7.1 g/dL (ref 6.0–8.3)

## 2014-05-23 LAB — PRO B NATRIURETIC PEPTIDE: Pro B Natriuretic peptide (BNP): 463.3 pg/mL — ABNORMAL HIGH (ref 0–125)

## 2014-05-23 LAB — HEPARIN LEVEL (UNFRACTIONATED): Heparin Unfractionated: <0.1 IU/mL — ABNORMAL LOW (ref 0.30–0.70)

## 2014-05-23 MED ORDER — FUROSEMIDE 10 MG/ML IJ SOLN
40.0000 mg | Freq: Once | INTRAMUSCULAR | Status: AC
  Administered 2014-05-23: 40 mg via INTRAVENOUS
  Filled 2014-05-23: qty 4

## 2014-05-23 MED ORDER — CHLORHEXIDINE GLUCONATE 4 % EX LIQD
1.0000 "application " | Freq: Once | CUTANEOUS | Status: AC
  Administered 2014-05-23: 1 via TOPICAL
  Filled 2014-05-23 (×2): qty 15

## 2014-05-23 MED ORDER — HEPARIN (PORCINE) IN NACL 100-0.45 UNIT/ML-% IJ SOLN
2500.0000 [IU]/h | INTRAMUSCULAR | Status: DC
  Administered 2014-05-23: 2300 [IU]/h via INTRAVENOUS
  Filled 2014-05-23 (×2): qty 250

## 2014-05-23 MED ORDER — CHLORHEXIDINE GLUCONATE 4 % EX LIQD
1.0000 "application " | Freq: Once | CUTANEOUS | Status: DC
  Filled 2014-05-23 (×2): qty 15

## 2014-05-23 NOTE — Progress Notes (Signed)
05/23/14  Pharmacy-[facility] 2215  Heparin level < 0.1  A/P:  48yo male on heparin bridge for hx multiple VTEs, while off Coumadin in preparation for OR on 6/29.  Heparin level is below goal, running at correct rate.  No bleeding problems noted.  Will increase in case OR schedule changed, to stop ~0115.  1-  Inc Heparin to 2500 units/hr 2-  Check HL in AM does not go to [place], PharmD [facility]

## 2014-05-23 NOTE — Progress Notes (Signed)
ANTICOAGULATION CONSULT NOTE - Follow Up Consult  Pharmacy Consult for Heparin  Indication: hx of multiple VTE's  Allergies  Allergen Reactions  . Lisinopril Other (See Comments)    cough  . Ace Inhibitors     cough    Patient Measurements: Height: 5\' 11"  (180.3 cm) Weight: 315 lb 4.1 oz (143 kg) IBW/kg (Calculated) : 75.3  Vital Signs: Temp: 99.1 F (37.3 C) (06/27 2132) Temp src: Oral (06/27 2132) BP: 122/64 mmHg (06/27 2132) Pulse Rate: 110 (06/27 2132)  Labs:  Recent Labs  05/21/14 1146 05/22/14 0445 05/22/14 2234  HGB 15.1 13.9  --   HCT 47.3 42.8  --   PLT 238 216  --   LABPROT 32.4* 20.5*  --   INR 3.16* 1.76*  --   HEPARINUNFRC  --   --  <0.10*  CREATININE 0.60 0.58  --     Estimated Creatinine Clearance: 165.3 ml/min (by C-G formula based on Cr of 0.58).   Medications:  Heparin 1950 units/hr  Assessment: Heparin bridge for cholecystectomy in the AM, order for heparin to turn off at 0200 this am, HL <0.1, other labs as above.   Goal of Therapy:  Heparin level 0.3-0.7 units/ml Monitor platelets by anticoagulation protocol: Yes   Plan:  -Increase heparin to 2300 units/hr -Heparin off at 0200 per MD -F/U anticoagulation plans after surgery  Narda Bonds 05/23/2014,12:03 AM

## 2014-05-23 NOTE — Progress Notes (Signed)
Echo Lab  2D Echocardiogram completed.  [place], RDCS 05/23/2014 9:03 AM

## 2014-05-23 NOTE — Progress Notes (Signed)
Acute acalculous cholecystitis  Subjective: RUQ pain about the same today, had an episode of tachycardia and hypoxia, placed on a hep gtt overnight  Objective: Vital signs in last 24 hours: Temp:  [97.3 F (36.3 C)-99.1 F (37.3 C)] 97.8 F (36.6 C) (06/28 0432) Pulse Rate:  [95-127] 95 (06/28 0432) Resp:  [18-20] 18 (06/28 0432) BP: (122-153)/(61-89) 123/61 mmHg (06/28 0432) SpO2:  [89 %-98 %] 98 % (06/28 0432) Last BM Date: 05/20/14  Intake/Output from previous day: 06/27 0701 - 06/28 0700 In: 3112.5 [I.V.:2912.5; IV Piggyback:200] Out: 1625 [Urine:1625] Intake/Output this shift: Total I/O In: 100 [P.O.:100] Out: 800 [Urine:800] General appearance: alert and cooperative, no distress GI: normal findings: tender to palpation in RUQ, no signs of peritonitis   Lab Results:  Results for orders placed during the hospital encounter of 05/21/14 (from the past 24 hour(s))  SURGICAL PCR SCREEN     Status: None   Collection Time    05/22/14 10:20 AM      Result Value Ref Range   MRSA, PCR NEGATIVE  NEGATIVE   Staphylococcus aureus NEGATIVE  NEGATIVE  GLUCOSE, CAPILLARY     Status: Abnormal   Collection Time    05/22/14 12:19 PM      Result Value Ref Range   Glucose-Capillary 159 (*) 70 - 99 mg/dL  GLUCOSE, CAPILLARY     Status: Abnormal   Collection Time    05/22/14  4:30 PM      Result Value Ref Range   Glucose-Capillary 153 (*) 70 - 99 mg/dL  GLUCOSE, CAPILLARY     Status: Abnormal   Collection Time    05/22/14  8:02 PM      Result Value Ref Range   Glucose-Capillary 147 (*) 70 - 99 mg/dL  HEPARIN LEVEL (UNFRACTIONATED)     Status: Abnormal   Collection Time    05/22/14 10:34 PM      Result Value Ref Range   Heparin Unfractionated <0.10 (*) 0.30 - 0.70 IU/mL  GLUCOSE, CAPILLARY     Status: Abnormal   Collection Time    05/22/14 11:48 PM      Result Value Ref Range   Glucose-Capillary 126 (*) 70 - 99 mg/dL  GLUCOSE, CAPILLARY     Status: Abnormal   Collection  Time    05/23/14  4:30 AM      Result Value Ref Range   Glucose-Capillary 122 (*) 70 - 99 mg/dL  PROTIME-INR     Status: Abnormal   Collection Time    05/23/14  6:20 AM      Result Value Ref Range   Prothrombin Time 15.8 (*) 11.6 - 15.2 seconds   INR 1.26  0.00 - 1.49  COMPREHENSIVE METABOLIC PANEL     Status: Abnormal   Collection Time    05/23/14  6:20 AM      Result Value Ref Range   Sodium 132 (*) 137 - 147 mEq/L   Potassium 4.0  3.7 - 5.3 mEq/L   Chloride 92 (*) 96 - 112 mEq/L   CO2 23  19 - 32 mEq/L   Glucose, Bld 115 (*) 70 - 99 mg/dL   BUN 12  6 - 23 mg/dL   Creatinine, Ser 0.73  0.50 - 1.35 mg/dL   Calcium 8.8  8.4 - 10.5 mg/dL   Total Protein 7.1  6.0 - 8.3 g/dL   Albumin 2.6 (*) 3.5 - 5.2 g/dL   AST 83 (*) 0 - 37 U/L   ALT 110 (*)  0 - 53 U/L   Alkaline Phosphatase 113  39 - 117 U/L   Total Bilirubin 3.7 (*) 0.3 - 1.2 mg/dL   GFR calc non Af Amer >90  >90 mL/min   GFR calc Af Amer >90  >90 mL/min  CBC     Status: Abnormal   Collection Time    05/23/14  6:20 AM      Result Value Ref Range   WBC 18.3 (*) 4.0 - 10.5 K/uL   RBC 4.86  4.22 - 5.81 MIL/uL   Hemoglobin 13.1  13.0 - 17.0 g/dL   HCT 40.7  39.0 - 52.0 %   MCV 83.7  78.0 - 100.0 fL   MCH 27.0  26.0 - 34.0 pg   MCHC 32.2  30.0 - 36.0 g/dL   RDW 16.7 (*) 11.5 - 15.5 %   Platelets 197  150 - 400 K/uL  PRO B NATRIURETIC PEPTIDE     Status: Abnormal   Collection Time    05/23/14  6:20 AM      Result Value Ref Range   Pro B Natriuretic peptide (BNP) 463.3 (*) 0 - 125 pg/mL  GLUCOSE, CAPILLARY     Status: Abnormal   Collection Time    05/23/14  7:49 AM      Result Value Ref Range   Glucose-Capillary 124 (*) 70 - 99 mg/dL     Studies/Results Radiology     MEDS, Scheduled . insulin aspart  0-20 Units Subcutaneous 6 times per day  . metoprolol succinate  50 mg Oral Daily  . pantoprazole (PROTONIX) IV  40 mg Intravenous QHS  . piperacillin-tazobactam (ZOSYN)  IV  3.375 g Intravenous 3 times per  day  . sertraline  100 mg Oral Daily  . torsemide  20 mg Oral Daily  . traZODone  100 mg Oral QHS     Assessment: Acute acalculous cholecystitis Hyperbilirubinemia    Plan: Cont Zosyn, NPO and IVF's, INR down to 1.26 Anticipate cholecystectomy later today or in AM  LOS: 2 days    Rosario Adie, MD [facility], [place] [phone]   05/23/2014 10:18 AM

## 2014-05-23 NOTE — Progress Notes (Signed)
Utilization Review Completed.Dowell, Deborah T6/28/2015  

## 2014-05-23 NOTE — Progress Notes (Addendum)
PROGRESS NOTE  Nicolas [name] YPP:[id number] DOB: November 04, 1966 DOA: 05/21/2014 PCP: Ellsworth Lennox, MD  Brief history 48 year old male with a history of diabetes mellitus, recurrent PE (on warfarin), spinal stenosis, and hypertension presented with 2-3 day history of lower abdominal pain with associated nausea and vomiting.  At the time of admission, CT of abdomen and pelvis revealed a hydropic gallbladder with wall thickening and inflammatory stranding. Retrocardiac ultrasound confirmed gallbladder wall thickening with gallbladder sludge and debris. The patient was admitted by the surgical team. The patient was started on intravenous Zosyn. Because of the patient's coagulopathy from warfarin, the patient was given vitamin K x2 doses. He was recently started on a heparin drip until surgical intervention could be performed. Assessment/Plan: Acute acalculous cholecystitis  -RUQ u/s--gallbladder wall thickening with debris and sludge in gallbladder lumen  -CT abdomen and pelvis negative for bowel obstruction but shows hydropic gallbladder with wall thickening and inflammatory stranding  -Continue Zosyn  -management per surgery  Hypoxemia  -Likely combination hypoventilation from splinted breathing from pain and fluid overload -Give furosemide 40 mg IV x1 as the patient is npo -Saline lock IV fluids -order CXR--small R-pleural effusion with adjacent atelectasis -Presently without any distress on 2 L-->94% -echocardiogram--EF of 45-80%, grade 2 diastolic dysfunction -check pro-BNP--463 -pt also has diagnosed sleep apnea-->unable to afford CPAP machine History of recurrent PE  -Patient given vitamin K x 2  -Started patient on heparin drip pending surgical intervention  -Spoke with surgery service to update them  -pt with IVC filter  -Restart Coumadin when cleared by surgery after cholecystectomy  Diabetes mellitus type 2  -05/22/2014 hemoglobin A1c 6.0  -Discontinue metformin  during her inpatient hospitalization  -NovoLog sliding scale every 4 hours  -CBGs controlled  Hypertension  -Continue metoprolol succinate  -Elevated blood pressure partly due to uncontrolled pain  Tachycardia  -Likely due to pain  -Continue metoprolol succinate  -EKG--sinus tachycardia without ST -T wave changes Hx of tobacco use  -quit 13 years ago  Depression  -continue zoloft  Family Communication:   No family beside Disposition Plan:   Home when medically stable  Antibiotics:  Zosyn 6/26-->     Procedures/Studies: Mr Thoracic Spine W Wo Contrast  05/14/2014   CLINICAL DATA:  History of T6-8 decompression. Paraplegia and incontinence.  EXAM: MRI THORACIC SPINE WITHOUT AND WITH CONTRAST  TECHNIQUE: Multiplanar and multiecho pulse sequences of the thoracic spine were obtained without and with intravenous contrast.  CONTRAST:  47mL MULTIHANCE GADOBENATE DIMEGLUMINE 529 MG/ML IV SOLN  COMPARISON:  MRI thoracic spine 12/03/2013.  FINDINGS: Multilevel Schmorl's nodes are again seen. The patient status post extensive posterior decompression as detailed in a low. Focal edema within the thoracic cord at T8-9 is again identified and does not appear markedly changed. There may also be a focus of cord signal abnormality at the T6-7 level is may be new since the prior exam. Thoracic cord signal is otherwise unremarkable.  Since the prior examination, it appears the patient has undergone revision of decompression from T6-7 to approximately T7-8 since the prior examination. There is a fluid collection in the midline of the back tracking along the laminectomy sites. The collection measures 10.3 cm craniocaudal with its largest component in the axial plane at T6-7 where it measures 2.3 by 0.9 cm. There is a smaller collection the deep subcutaneous tissues measures 7.9 cm craniocaudal by up to 1.4 cm AP by 0.4 cm transverse. These collections likely represent seromas.  C7-T1: Shallow central protrusion  indents the ventral thecal sac without obvious central canal or foraminal stenosis.  T1-2: No change in a shallow disc bulge without central canal or foraminal narrowing.  T2-3: There is facet arthropathy and ligamentum flavum thickening. The central canal and foramina appear open.  T3-4: Status post posterior decompression. There is facet degenerative disease. The central canal and foramina appear open.  T4-5: Status post posterior decompression. Central canal and foramina appear open. Small T2 hyperintense collection eccentric to the right is seen in the sagittal plane only measuring 0.7 cm craniocaudal. This may be a synovial cyst off the right facet joint. It is unchanged.  T5-6: Status post posterior decompression. Facet arthropathy is present. The central canal and foramina appear open.  T6-7: The patient is status post decompression. Facet arthropathy and ligamentum flavum thickening appear unchanged. Facet arthropathy is noted.  T7-8: Revision of posterior decompression since the prior exam. Central canal stenosis appears improved although ligamentum flavum thickening and facet arthropathy continue to deform the cord. Foramina are open.  T8-9: Status post decompression. The central canal and foramina appear open.  T9-10: Status post posterior decompression. The central canal and foramina appear open.  T10-11: Status post posterior decompression. There is ligamentum flavum thickening and facet arthropathy. The central canal appears open. Foramina appear narrowed. This level is stable.  T11-12: Ligamentum flavum thickening and facet arthropathy are identified. Central canal stenosis appears unchanged.  T12-L1: This level is imaged in the sagittal plane only and unremarkable.  IMPRESSION: The patient is status post interval revision of posterior decompression at T7-8. The appearance of the T7-8 level is improved although facet arthropathy and ligamentum flavum thickening continue to deform the cord.  Possible  subtle focus of edema within the cord at T6-7 mid and present on the prior study but is better seen today.  Fluid collection extending from mid T6 to the T9-10 level likely represents a postoperative seroma as detailed above. A second more superficial collection also likely represents postoperative seroma. CSF leak or abscess are less likely but cannot be excluded.  Except as described above, the patient's examination is unchanged with edema again seen within the cord at T8-9 and multilevel posterior decompression identified.   Electronically Signed   By: Inge Rise M.D.   On: 05/14/2014 16:49   US Abdomen Complete  05/21/2014   CLINICAL DATA:  Nausea, vomiting, leukocytosis. Hydropic gallbladder with thickened wall on CT.  EXAM: ULTRASOUND ABDOMEN COMPLETE  COMPARISON:  CT abdomen and pelvis 05/21/2014. Abdominal ultrasound 10/26/2004.  FINDINGS: Examination was limited by patient body habitus.  Gallbladder:  The gallbladder wall is prominently thickened, measuring 9 mm in thickness. Debris/ sludge was noted in the lumen. No definite shadowing gallstones were identified. Sonographic Murphy's sign was negative.  Common bile duct:  Diameter: 5 mm  Liver:  Diffusely echogenic, compatible with steatosis.  IVC:  Not visualized.  Pancreas:  Not visualized.  Spleen:  Size and appearance within normal limits.  Right Kidney:  Length: 12.2 cm.  Not well visualized.  No gross hydronephrosis.  Left Kidney:  Length: 11.0 cm. Echogenicity within normal limits. No mass or hydronephrosis visualized.  Abdominal aorta:  Lower abdominal aorta not visualized. No aneurysm identified proximally.  Other findings:  None.  IMPRESSION: 1. Prominent gallbladder thickening with internal debris/sludge. No gallstones were identified and sonographic Murphy's sign was negative, however acute acalculous cholecystitis cannot be excluded in the appropriate clinical setting. 2. Hepatic steatosis.   Electronically Signed   By:  Logan Bores    On: 05/21/2014 18:13   Ct Abdomen Pelvis W Contrast  05/21/2014   CLINICAL DATA:  Hyperglycemia. Evaluate for ileus or small-bowel obstruction. Diffuse abdominal pain.  EXAM: CT ABDOMEN AND PELVIS WITH CONTRAST  TECHNIQUE: Multidetector CT imaging of the abdomen and pelvis was performed using the standard protocol following bolus administration of intravenous contrast.  CONTRAST:  131mL OMNIPAQUE IOHEXOL 300 MG/ML  SOLN  COMPARISON:  Abdominal radiographs 05/21/2014 and CT 11/27/2013  FINDINGS: Small, chronic right pleural effusion is unchanged. Mild bibasilar atelectasis/ scarring is again seen.  Diffusely low attenuation of the liver is again noted consistent with steatosis. The gallbladder appears mildly hydropic with evidence of wall thickening and mild fat stranding medial to it. No definite gallstones are identified. No biliary dilatation is seen. 1.5 cm cyst in the lower pole the left kidney is unchanged. The spleen, adrenal glands, right kidney, and pancreas are unremarkable.  Oral contrast is present in nondilated loops of small bowel. The colon is nondilated as well. There is no evidence of bowel obstruction. The bladder is unremarkable. A few small periportal lymph nodes are unchanged from the prior CT. There is no intraperitoneal free fluid or free air.  An infrarenal IVC filter is again seen with associated collapse of the IVC at and below the filter and with 1 of the filter's tines again noted to extend into the posterior aspect of the abdominal aorta, unchanged. Multiple collateral vessels are again noted in the anterior abdominal wall and visualized lower chest wall. Sequelae of prior laminectomies are again noted in the thoracic and lower lumbar spine.  IMPRESSION: 1. Mildly hydropic gallbladder with new wall thickening and mild surrounding inflammatory stranding. Further evaluation with right upper quadrant ultrasound is suggested to evaluate for acute cholecystitis. 2. No evidence of bowel  obstruction. 3. Hepatic steatosis. 4. Small, chronic right pleural effusion.   Electronically Signed   By: Logan Bores   On: 05/21/2014 16:30   Dg Chest Port 1 View  05/22/2014   CLINICAL DATA:  Preop for cholecystectomy. Hypertension. Diabetes. Ex-smoker. Right upper quadrant pain.  EXAM: PORTABLE CHEST - 1 VIEW  COMPARISON:  Acute abdomen series of 05/21/2014 and chest radiograph of 10/21/2008. Abdominal CT 05/21/2014.  FINDINGS: Tracheostomy is appropriately positioned. Patient rotated right. Cardiomegaly accentuated by AP portable technique. Small right pleural effusion is likely similar to yesterday's CT. No pneumothorax. No congestive failure. Patchy right base airspace disease.  IMPRESSION: Small right pleural effusion and adjacent atelectasis versus early infection. Consider short term radiographic followup.  Cardiomegaly and low lung volumes, without congestive failure.   Electronically Signed   By: Abigail Miyamoto M.D.   On: 05/22/2014 16:20   Dg Abd Acute W/chest  05/21/2014   CLINICAL DATA:  Constipation, shortness of breath.  EXAM: ACUTE ABDOMEN SERIES (ABDOMEN 2 VIEW & CHEST 1 VIEW)  COMPARISON:  10/21/2008 chest x-ray  FINDINGS: Lungs are hypoinflated with subtle density of the lateral right base likely scarring/atelectasis versus small amount of pleural fluid. There is stable cardiomegaly. Fusion hardware is present over the cervical spine unchanged. There is mild degenerative change of the spine.  Abdominal images demonstrate air throughout the colon. There are several air-filled minimally dilated small bowel loops present. There is no free air. There are mild degenerative changes of the hips. IVC filter is in adequate position.  IMPRESSION: Several air-filled mildly dilated small bowel loops with air seen throughout the colon. Findings may be due to ileus versus early/partial small  bowel obstructive process.  Hypoinflation with minimal lateral right basilar density likely scarring/atelectasis  versus small amount of right pleural fluid.  Cardiomegaly.   Electronically Signed   By: Marin Olp M.D.   On: 05/21/2014 12:46         Subjective: Patient continues to complain of right upper quadrant abdominal pain with deep breathing. Denies any fevers, chills, chest pain, shortness breath, nausea, vomiting, diarrhea, dysuria, hematuria.   Objective: Filed Vitals:   05/22/14 1020 05/22/14 1409 05/22/14 2132 05/23/14 0432  BP: 149/89 153/73 122/64 123/61  Pulse: 124 127 110 95  Temp: 97.9 F (36.6 C) 97.3 F (36.3 C) 99.1 F (37.3 C) 97.8 F (36.6 C)  TempSrc: Oral Oral Oral Oral  Resp: 18 20 20 18   Height:      Weight:      SpO2: 91% 93% 89% 98%    Intake/Output Summary (Last 24 hours) at 05/23/14 0734 Last data filed at 05/23/14 0600  Gross per 24 hour  Intake 3112.5 ml  Output   1625 ml  Net 1487.5 ml   Weight change:  Exam:   General:  Pt is alert, follows commands appropriately, not in acute distress  HEENT: No icterus, No thrush,  [place]/AT  Cardiovascular: RRR, S1/S2, no rubs, no gallops  Respiratory: Bibasilar crackles with diminished breath sounds on the right base. No wheezing.  Abdomen: Soft/+BS, RUQ and epigastric tender, non distended, no guarding  Extremities: 1+LE edema, No lymphangitis, No petechiae, No rashes, no synovitis  Data Reviewed: Basic Metabolic Panel:  Recent Labs Lab 05/21/14 1146 05/22/14 0445  NA 135* 137  K 3.5* 3.7  CL 93* 96  CO2 27 26  GLUCOSE 161* 146*  BUN 4* 5*  CREATININE 0.60 0.58  CALCIUM 9.6 9.4   Liver Function Tests:  Recent Labs Lab 05/22/14 0445  AST 126*  ALT 115*  ALKPHOS 115  BILITOT 4.1*  PROT 8.1  ALBUMIN 3.3*    Recent Labs Lab 05/21/14 1146 05/22/14 0445  LIPASE 19 13   No results found for this basename: AMMONIA,  in the last 168 hours CBC:  Recent Labs Lab 05/21/14 1146 05/22/14 0445 05/23/14 0620  WBC 15.5* 21.2* 18.3*  NEUTROABS 12.8*  --   --   HGB 15.1 13.9 13.1   HCT 47.3 42.8 40.7  MCV 84.9 82.8 83.7  PLT 238 216 197   Cardiac Enzymes: No results found for this basename: CKTOTAL, CKMB, CKMBINDEX, TROPONINI,  in the last 168 hours BNP: No components found with this basename: POCBNP,  CBG:  Recent Labs Lab 05/22/14 1219 05/22/14 1630 05/22/14 2002 05/22/14 2348 05/23/14 0430  GLUCAP 159* 153* 147* 126* 122*    Recent Results (from the past 240 hour(s))  SURGICAL PCR SCREEN     Status: None   Collection Time    05/22/14 10:20 AM      Result Value Ref Range Status   MRSA, PCR NEGATIVE  NEGATIVE Final   Staphylococcus aureus NEGATIVE  NEGATIVE Final   Comment:            The Xpert SA Assay (FDA     approved for NASAL specimens     in patients over 33 years of age),     is one component of     a comprehensive surveillance     program.  Test performance has     been validated by [employer] for patients greater     than or equal  to 56 year old.     It is not intended     to diagnose infection nor to     guide or monitor treatment.     Scheduled Meds: . furosemide  40 mg Intravenous Once  . insulin aspart  0-20 Units Subcutaneous 6 times per day  . metoprolol succinate  50 mg Oral Daily  . pantoprazole (PROTONIX) IV  40 mg Intravenous QHS  . piperacillin-tazobactam (ZOSYN)  IV  3.375 g Intravenous 3 times per day  . sertraline  100 mg Oral Daily  . torsemide  20 mg Oral Daily  . traZODone  100 mg Oral QHS   Continuous Infusions:    TAT, DAVID, DO  [facility] Pager [phone]  If 7PM-7AM, please contact night-coverage [email or web address] Password [facility] 05/23/2014, 7:34 AM   LOS: 2 days

## 2014-05-23 NOTE — Progress Notes (Signed)
ANTICOAGULATION CONSULT NOTE -  Consult  Pharmacy Consult for Heparin Indication: h/o multiple VTEs  Allergies  Allergen Reactions  . Lisinopril Other (See Comments)    cough  . Ace Inhibitors     cough    Patient Measurements: Height: 5\' 11"  (180.3 cm) Weight: 315 lb 4.1 oz (143 kg) IBW/kg (Calculated) : 75.3 Heparin Dosing Weight:  108.7 kg  Vital Signs: Temp: 97.8 F (36.6 C) (06/28 0432) Temp src: Oral (06/28 0432) BP: 123/61 mmHg (06/28 0432) Pulse Rate: 95 (06/28 0432)  Labs:  Recent Labs  05/21/14 1146 05/22/14 0445 05/22/14 2234 05/23/14 0620  HGB 15.1 13.9  --  13.1  HCT 47.3 42.8  --  40.7  PLT 238 216  --  197  LABPROT 32.4* 20.5*  --  15.8*  INR 3.16* 1.76*  --  1.26  HEPARINUNFRC  --   --  <0.10*  --   CREATININE 0.60 0.58  --  0.73    Estimated Creatinine Clearance: 165.3 ml/min (by C-G formula based on Cr of 0.73).   Medical History: Past Medical History  Diagnosis Date  . Arthritis   . Clotting disorder   . Hypertension   . Pulmonary embolism     x 3 in 2005, 2008, 2010  . DVT (deep venous thrombosis)     Summer 2012  . Spinal stenosis   . Depression   . Sleep apnea   . Diabetes mellitus without complication     Medications:  Prescriptions prior to admission  Medication Sig Dispense Refill  . clotrimazole-betamethasone (LOTRISONE) cream Apply 1 application topically 2 (two) times daily.  45 g  3  . diazepam (VALIUM) 10 MG tablet Take 1 tablet by mouth every 8 hours if needed for anxiety  90 tablet  1  . ketoconazole (NIZORAL) 2 % shampoo Apply 1 application topically 2 (two) times a week.      . metFORMIN (GLUCOPHAGE) 500 MG tablet Take 1 tablet (500 mg total) by mouth 2 (two) times daily with a meal.  60 tablet  5  . metoprolol succinate (TOPROL-XL) 50 MG 24 hr tablet Take 1 tablet (50 mg total) by mouth daily.  30 tablet  5  . multivitamin (ONE-A-DAY MEN'S) TABS tablet Take 1 tablet by mouth daily.      . potassium chloride SA  (KLOR-CON M20) 20 MEQ tablet Take 1 tablet (20 mEq total) by mouth 2 (two) times daily.  60 tablet  2  . sertraline (ZOLOFT) 100 MG tablet Take 1 and 1/2 tablets by mouth daily.  45 tablet  5  . torsemide (DEMADEX) 20 MG tablet Take 1 tablet (20 mg total) by mouth daily.  30 tablet  5  . traZODone (DESYREL) 100 MG tablet Take 1 tablet (100 mg total) by mouth at bedtime.  30 tablet  5  . warfarin (COUMADIN) 10 MG tablet Take 5-10 mg by mouth daily. Take 5mg  on Monday, Wednesday and Friday.  All other days of the week take 10mg .       Admit Complaint:  Acute acalculous cholecystitis   Anticoagulation: h/o mult.VTEs: INR 3.16>>1.26 s/p IV Vitamin K 10mg . Start IV heparin bridge. CBC WNL. Heparin turned off last PM at 0220 in preparation for surgery. MD would like to resume now as surgery moved to tomorrow.  Infectious Disease: Tmax 99.1. WBC 18.3. +Zosyn  Cardiovascular: HTN. VSS on Troplol XL and Demadex  Endocrinology: DM on SSI. CBGs 107-124  Gastrointestinal / Nutrition: IV PPI  Neurology: spinal  stenosis (back surgery x 6), depression  on Zoloft  Nephrology: CrCl >100  Pulmonary: OSA  Hematology / Oncology  PTA Medication Issues  Best Practices: PPI, IV heparin  Goal of Therapy:  Heparin level 0.3-0.7 units/ml Monitor platelets by anticoagulation protocol: Yes   Plan:  Anticipate cholecystectomy -moved to Monday Re-Start IV heparin (no bolus) at 2300 units/hr Check heparin level in 6 hrs and daily  [name] S. Alford Highland, PharmD, BCPS Clinical Staff Pharmacist Pager [place], [place] 05/23/2014,1:03 PM

## 2014-05-24 ENCOUNTER — Encounter (HOSPITAL_COMMUNITY): Payer: Medicare HMO | Admitting: Anesthesiology

## 2014-05-24 ENCOUNTER — Inpatient Hospital Stay (HOSPITAL_COMMUNITY): Payer: Medicare HMO | Admitting: Anesthesiology

## 2014-05-24 ENCOUNTER — Encounter (HOSPITAL_COMMUNITY): Payer: Self-pay | Admitting: Anesthesiology

## 2014-05-24 ENCOUNTER — Encounter (HOSPITAL_COMMUNITY): Admission: EM | Disposition: A | Discharge status: Home / Self Care | Payer: Self-pay | Source: Home / Self Care

## 2014-05-24 DIAGNOSIS — K810 Acute cholecystitis: Secondary | ICD-10-CM

## 2014-05-24 HISTORY — PX: CHOLECYSTECTOMY: SHX55

## 2014-05-24 LAB — GLUCOSE, CAPILLARY
Glucose-Capillary: 121 mg/dL — ABNORMAL HIGH (ref 70–99)
Glucose-Capillary: 123 mg/dL — ABNORMAL HIGH (ref 70–99)
Glucose-Capillary: 126 mg/dL — ABNORMAL HIGH (ref 70–99)
Glucose-Capillary: 87 mg/dL (ref 70–99)
Glucose-Capillary: 89 mg/dL (ref 70–99)
Glucose-Capillary: 92 mg/dL (ref 70–99)

## 2014-05-24 LAB — COMPREHENSIVE METABOLIC PANEL
ALT: 102 U/L — ABNORMAL HIGH (ref 0–53)
AST: 71 U/L — ABNORMAL HIGH (ref 0–37)
Albumin: 2.3 g/dL — ABNORMAL LOW (ref 3.5–5.2)
Alkaline Phosphatase: 114 U/L (ref 39–117)
BUN: 14 mg/dL (ref 6–23)
CO2: 30 mEq/L (ref 19–32)
Calcium: 9 mg/dL (ref 8.4–10.5)
Chloride: 95 mEq/L — ABNORMAL LOW (ref 96–112)
Creatinine, Ser: 0.74 mg/dL (ref 0.50–1.35)
GFR calc Af Amer: >90 mL/min (ref >90)
GFR calc non Af Amer: >90 mL/min (ref >90)
Glucose, Bld: 96 mg/dL (ref 70–99)
Potassium: 3.6 mEq/L — ABNORMAL LOW (ref 3.7–5.3)
Sodium: 137 mEq/L (ref 137–147)
Total Bilirubin: 2.5 mg/dL — ABNORMAL HIGH (ref 0.3–1.2)
Total Protein: 7 g/dL (ref 6.0–8.3)

## 2014-05-24 LAB — CBC
HCT: 38.4 % — ABNORMAL LOW (ref 39.0–52.0)
Hemoglobin: 12.1 g/dL — ABNORMAL LOW (ref 13.0–17.0)
MCH: 26.9 pg (ref 26.0–34.0)
MCHC: 31.5 g/dL (ref 30.0–36.0)
MCV: 85.5 fL (ref 78.0–100.0)
Platelets: 196 10*3/uL (ref 150–400)
RBC: 4.49 MIL/uL (ref 4.22–5.81)
RDW: 16.7 % — ABNORMAL HIGH (ref 11.5–15.5)
WBC: 12.3 10*3/uL — ABNORMAL HIGH (ref 4.0–10.5)

## 2014-05-24 LAB — HEPARIN LEVEL (UNFRACTIONATED): Heparin Unfractionated: <0.1 IU/mL — ABNORMAL LOW (ref 0.30–0.70)

## 2014-05-24 SURGERY — LAPAROSCOPIC CHOLECYSTECTOMY WITH INTRAOPERATIVE CHOLANGIOGRAM
Anesthesia: General | Site: Abdomen

## 2014-05-24 MED ORDER — NEOSTIGMINE METHYLSULFATE 10 MG/10ML IV SOLN
INTRAVENOUS | Status: DC | PRN
  Administered 2014-05-24: 3 mg via INTRAVENOUS

## 2014-05-24 MED ORDER — PHENYLEPHRINE 40 MCG/ML (10ML) SYRINGE FOR IV PUSH (FOR BLOOD PRESSURE SUPPORT)
PREFILLED_SYRINGE | INTRAVENOUS | Status: AC
  Filled 2014-05-24: qty 10

## 2014-05-24 MED ORDER — ONDANSETRON HCL 4 MG/2ML IJ SOLN
INTRAMUSCULAR | Status: AC
  Filled 2014-05-24: qty 2

## 2014-05-24 MED ORDER — BUPIVACAINE-EPINEPHRINE (PF) 0.25% -1:200000 IJ SOLN
INTRAMUSCULAR | Status: AC
  Filled 2014-05-24: qty 30

## 2014-05-24 MED ORDER — SUCCINYLCHOLINE CHLORIDE 20 MG/ML IJ SOLN
INTRAMUSCULAR | Status: AC
  Filled 2014-05-24: qty 1

## 2014-05-24 MED ORDER — SODIUM CHLORIDE 0.9 % IJ SOLN
INTRAMUSCULAR | Status: AC
  Filled 2014-05-24: qty 10

## 2014-05-24 MED ORDER — MIDAZOLAM HCL 5 MG/5ML IJ SOLN
INTRAMUSCULAR | Status: DC | PRN
  Administered 2014-05-24: 2 mg via INTRAVENOUS

## 2014-05-24 MED ORDER — LACTATED RINGERS IV SOLN
Freq: Once | INTRAVENOUS | Status: AC
  Administered 2014-05-24: 09:00:00 via INTRAVENOUS

## 2014-05-24 MED ORDER — LIDOCAINE HCL (CARDIAC) 20 MG/ML IV SOLN
INTRAVENOUS | Status: AC
  Filled 2014-05-24: qty 5

## 2014-05-24 MED ORDER — EPHEDRINE SULFATE 50 MG/ML IJ SOLN
INTRAMUSCULAR | Status: AC
  Filled 2014-05-24: qty 1

## 2014-05-24 MED ORDER — HYDROMORPHONE HCL PF 1 MG/ML IJ SOLN
INTRAMUSCULAR | Status: AC
  Filled 2014-05-24: qty 1

## 2014-05-24 MED ORDER — FENTANYL CITRATE 0.05 MG/ML IJ SOLN
INTRAMUSCULAR | Status: AC
  Filled 2014-05-24: qty 5

## 2014-05-24 MED ORDER — ROCURONIUM BROMIDE 100 MG/10ML IV SOLN
INTRAVENOUS | Status: DC | PRN
  Administered 2014-05-24: 10 mg via INTRAVENOUS
  Administered 2014-05-24: 40 mg via INTRAVENOUS

## 2014-05-24 MED ORDER — LABETALOL HCL 5 MG/ML IV SOLN
INTRAVENOUS | Status: AC
  Filled 2014-05-24: qty 4

## 2014-05-24 MED ORDER — SODIUM CHLORIDE 0.9 % IR SOLN
Status: DC | PRN
  Administered 2014-05-24: 1000 mL
  Administered 2014-05-24: 3000 mL

## 2014-05-24 MED ORDER — PROPOFOL 10 MG/ML IV BOLUS
INTRAVENOUS | Status: AC
Start: 2014-05-24 — End: 2014-05-24
  Filled 2014-05-24: qty 20

## 2014-05-24 MED ORDER — ROCURONIUM BROMIDE 50 MG/5ML IV SOLN
INTRAVENOUS | Status: AC
  Filled 2014-05-24: qty 1

## 2014-05-24 MED ORDER — LACTATED RINGERS IV SOLN
INTRAVENOUS | Status: DC | PRN
  Administered 2014-05-24 (×2): via INTRAVENOUS

## 2014-05-24 MED ORDER — GLYCOPYRROLATE 0.2 MG/ML IJ SOLN
INTRAMUSCULAR | Status: DC | PRN
  Administered 2014-05-24: .4 mg via INTRAVENOUS

## 2014-05-24 MED ORDER — OXYCODONE HCL 5 MG/5ML PO SOLN: 5.0000 mg | Freq: Once | ORAL | Status: DC | PRN

## 2014-05-24 MED ORDER — ARTIFICIAL TEARS OP OINT
TOPICAL_OINTMENT | OPHTHALMIC | Status: AC
  Filled 2014-05-24: qty 3.5

## 2014-05-24 MED ORDER — NEOSTIGMINE METHYLSULFATE 10 MG/10ML IV SOLN
INTRAVENOUS | Status: AC
  Filled 2014-05-24: qty 2

## 2014-05-24 MED ORDER — MEPERIDINE HCL 25 MG/ML IJ SOLN: 6.2500 mg | INTRAMUSCULAR | Status: DC | PRN

## 2014-05-24 MED ORDER — LIDOCAINE HCL (CARDIAC) 20 MG/ML IV SOLN
INTRAVENOUS | Status: DC | PRN
  Administered 2014-05-24: 40 mg via INTRAVENOUS
  Administered 2014-05-24: 60 mg via INTRAVENOUS

## 2014-05-24 MED ORDER — MORPHINE SULFATE 2 MG/ML IJ SOLN
2.0000 mg | INTRAMUSCULAR | Status: DC | PRN
  Administered 2014-05-29: 4 mg via INTRAVENOUS
  Filled 2014-05-24: qty 2

## 2014-05-24 MED ORDER — PROPOFOL 10 MG/ML IV BOLUS
INTRAVENOUS | Status: DC | PRN
  Administered 2014-05-24: 200 mg via INTRAVENOUS

## 2014-05-24 MED ORDER — MIDAZOLAM HCL 2 MG/2ML IJ SOLN
INTRAMUSCULAR | Status: AC
  Filled 2014-05-24: qty 2

## 2014-05-24 MED ORDER — SUCCINYLCHOLINE CHLORIDE 20 MG/ML IJ SOLN
INTRAMUSCULAR | Status: DC | PRN
  Administered 2014-05-24: 160 mg via INTRAVENOUS

## 2014-05-24 MED ORDER — LABETALOL HCL 5 MG/ML IV SOLN
INTRAVENOUS | Status: DC | PRN
  Administered 2014-05-24: 10 mg via INTRAVENOUS

## 2014-05-24 MED ORDER — BUPIVACAINE-EPINEPHRINE 0.25% -1:200000 IJ SOLN
INTRAMUSCULAR | Status: DC | PRN
  Administered 2014-05-24: 30 mL

## 2014-05-24 MED ORDER — ONDANSETRON HCL 4 MG/2ML IJ SOLN
INTRAMUSCULAR | Status: DC | PRN
  Administered 2014-05-24: 4 mg via INTRAVENOUS

## 2014-05-24 MED ORDER — 0.9 % SODIUM CHLORIDE (POUR BTL) OPTIME
TOPICAL | Status: DC | PRN
  Administered 2014-05-24: 1000 mL

## 2014-05-24 MED ORDER — GLYCOPYRROLATE 0.2 MG/ML IJ SOLN
INTRAMUSCULAR | Status: AC
  Filled 2014-05-24: qty 2

## 2014-05-24 MED ORDER — MIDAZOLAM HCL 2 MG/2ML IJ SOLN: 0.5000 mg | Freq: Once | INTRAMUSCULAR | Status: DC | PRN

## 2014-05-24 MED ORDER — SODIUM CHLORIDE 0.9 % IV SOLN
INTRAVENOUS | Status: DC
  Administered 2014-05-24: 14:00:00 via INTRAVENOUS

## 2014-05-24 MED ORDER — OXYCODONE HCL 5 MG PO TABS: 5.0000 mg | ORAL_TABLET | Freq: Once | ORAL | Status: DC | PRN

## 2014-05-24 MED ORDER — INSULIN ASPART 100 UNIT/ML ~~LOC~~ SOLN
0.0000 [IU] | Freq: Three times a day (TID) | SUBCUTANEOUS | Status: DC
  Administered 2014-05-27 – 2014-05-28 (×3): 2 [IU] via SUBCUTANEOUS
  Administered 2014-05-29 (×2): 3 [IU] via SUBCUTANEOUS
  Administered 2014-05-30: 2 [IU] via SUBCUTANEOUS
  Administered 2014-05-30: 3 [IU] via SUBCUTANEOUS
  Administered 2014-05-31: 2 [IU] via SUBCUTANEOUS
  Administered 2014-06-01: 3 [IU] via SUBCUTANEOUS

## 2014-05-24 MED ORDER — PROPOFOL 10 MG/ML IV BOLUS
INTRAVENOUS | Status: AC
  Filled 2014-05-24: qty 20

## 2014-05-24 MED ORDER — FENTANYL CITRATE 0.05 MG/ML IJ SOLN
INTRAMUSCULAR | Status: DC | PRN
  Administered 2014-05-24: 100 ug via INTRAVENOUS
  Administered 2014-05-24: 50 ug via INTRAVENOUS
  Administered 2014-05-24: 150 ug via INTRAVENOUS
  Administered 2014-05-24: 100 ug via INTRAVENOUS

## 2014-05-24 MED ORDER — HYDROMORPHONE HCL PF 1 MG/ML IJ SOLN
0.2500 mg | INTRAMUSCULAR | Status: DC | PRN
Start: 2014-05-24 — End: 2014-05-24
  Administered 2014-05-24: 0.5 mg via INTRAVENOUS

## 2014-05-24 MED ORDER — PROMETHAZINE HCL 25 MG/ML IJ SOLN: 6.2500 mg | INTRAMUSCULAR | Status: DC | PRN

## 2014-05-24 MED ORDER — HEPARIN (PORCINE) IN NACL 100-0.45 UNIT/ML-% IJ SOLN: 2500.0000 [IU]/h | INTRAMUSCULAR | Status: DC

## 2014-05-24 SURGICAL SUPPLY — 57 items
ADH SKN CLS APL DERMABOND .7 (GAUZE/BANDAGES/DRESSINGS) ×1
APPLIER CLIP 5 13 M/L LIGAMAX5 (MISCELLANEOUS) ×2
APR CLP MED LRG 5 ANG JAW (MISCELLANEOUS) ×1
BAG SPEC RTRVL 10 TROC 200 (ENDOMECHANICALS) ×1
BLADE CLIPPER SURG NEURO (BLADE) ×2 IMPLANT
CANISTER SUCTION 2500CC (MISCELLANEOUS) ×2 IMPLANT
CHLORAPREP W/TINT 26ML (MISCELLANEOUS) ×2 IMPLANT
CLIP APPLIE 5 13 M/L LIGAMAX5 (MISCELLANEOUS) ×1 IMPLANT
COVER MAYO STAND STRL (DRAPES) ×2 IMPLANT
COVER SURGICAL LIGHT HANDLE (MISCELLANEOUS) ×2 IMPLANT
DERMABOND ADVANCED (GAUZE/BANDAGES/DRESSINGS) ×1
DERMABOND ADVANCED .7 DNX12 (GAUZE/BANDAGES/DRESSINGS) ×1 IMPLANT
DRAIN CHANNEL 19F RND (DRAIN) ×2 IMPLANT
DRAPE C-ARM 42X72 X-RAY (DRAPES) ×2 IMPLANT
DRAPE UTILITY 15X26 W/TAPE STR (DRAPE) ×4 IMPLANT
ELECT REM PT RETURN 9FT ADLT (ELECTROSURGICAL) ×2
ELECTRODE REM PT RTRN 9FT ADLT (ELECTROSURGICAL) ×1 IMPLANT
EVACUATOR SILICONE 100CC (DRAIN) ×2 IMPLANT
FILTER SMOKE EVAC LAPAROSHD (FILTER) ×2 IMPLANT
GAUZE SPONGE 2X2 8PLY STRL LF (GAUZE/BANDAGES/DRESSINGS) ×1 IMPLANT
GLOVE BIO SURGEON STRL SZ7 (GLOVE) ×2 IMPLANT
GLOVE BIO SURGEON STRL SZ7.5 (GLOVE) ×2 IMPLANT
GLOVE BIO SURGEON STRL SZ8 (GLOVE) ×2 IMPLANT
GLOVE BIOGEL PI IND STRL 7.0 (GLOVE) IMPLANT
GLOVE BIOGEL PI IND STRL 7.5 (GLOVE) ×1 IMPLANT
GLOVE BIOGEL PI IND STRL 8 (GLOVE) ×1 IMPLANT
GLOVE BIOGEL PI INDICATOR 7.0 (GLOVE) ×1
GLOVE BIOGEL PI INDICATOR 7.5 (GLOVE) ×1
GLOVE BIOGEL PI INDICATOR 8 (GLOVE) ×2
GLOVE ECLIPSE 7.5 STRL STRAW (GLOVE) ×2 IMPLANT
GLOVE SURG SS PI 7.0 STRL IVOR (GLOVE) ×2 IMPLANT
GOWN STRL REUS W/ TWL LRG LVL3 (GOWN DISPOSABLE) ×2 IMPLANT
GOWN STRL REUS W/ TWL XL LVL3 (GOWN DISPOSABLE) ×1 IMPLANT
GOWN STRL REUS W/TWL LRG LVL3 (GOWN DISPOSABLE) ×6
GOWN STRL REUS W/TWL XL LVL3 (GOWN DISPOSABLE) ×2
KIT BASIN OR (CUSTOM PROCEDURE TRAY) ×2 IMPLANT
KIT ROOM TURNOVER OR (KITS) ×2 IMPLANT
NEEDLE 22X1 1/2 (OR ONLY) (NEEDLE) ×2 IMPLANT
NS IRRIG 1000ML POUR BTL (IV SOLUTION) ×2 IMPLANT
PAD ARMBOARD 7.5X6 YLW CONV (MISCELLANEOUS) ×2 IMPLANT
POUCH RETRIEVAL ECOSAC 10 (ENDOMECHANICALS) ×1 IMPLANT
POUCH RETRIEVAL ECOSAC 10MM (ENDOMECHANICALS) ×1
SCISSORS LAP 5X35 DISP (ENDOMECHANICALS) ×2 IMPLANT
SET CHOLANGIOGRAPH 5 50 .035 (SET/KITS/TRAYS/PACK) ×2 IMPLANT
SET IRRIG TUBING LAPAROSCOPIC (IRRIGATION / IRRIGATOR) ×2 IMPLANT
SLEEVE ENDOPATH XCEL 5M (ENDOMECHANICALS) ×4 IMPLANT
SPECIMEN JAR SMALL (MISCELLANEOUS) ×2 IMPLANT
SPONGE GAUZE 2X2 STER 10/PKG (GAUZE/BANDAGES/DRESSINGS) ×1
SUT ETHILON 2 0 FS 18 (SUTURE) ×2 IMPLANT
SUT VIC AB 4-0 PS2 27 (SUTURE) ×2 IMPLANT
SUT VICRYL 0 UR6 27IN ABS (SUTURE) ×2 IMPLANT
TAPE CLOTH SURG 4X10 WHT LF (GAUZE/BANDAGES/DRESSINGS) ×2 IMPLANT
TOWEL OR 17X24 6PK STRL BLUE (TOWEL DISPOSABLE) ×2 IMPLANT
TOWEL OR 17X26 10 PK STRL BLUE (TOWEL DISPOSABLE) ×2 IMPLANT
TRAY LAPAROSCOPIC (CUSTOM PROCEDURE TRAY) ×2 IMPLANT
TROCAR XCEL BLUNT TIP 100MML (ENDOMECHANICALS) ×2 IMPLANT
TROCAR XCEL NON-BLD 5MMX100MML (ENDOMECHANICALS) ×2 IMPLANT

## 2014-05-24 NOTE — Progress Notes (Signed)
  Subjective: Less pain  Objective: Vital signs in last 24 hours: Temp:  [97.7 F (36.5 C)-98.2 F (36.8 C)] 97.9 F (36.6 C) (06/29 0536) Pulse Rate:  [89-92] 89 (06/29 0536) Resp:  [16-18] 17 (06/29 0536) BP: (105-120)/(52-97) 120/65 mmHg (06/29 0536) SpO2:  [94 %-98 %] 94 % (06/29 0536) Last BM Date: 05/20/14  Intake/Output from previous day: 06/28 0701 - 06/29 0700 In: 739.2 [P.O.:100; I.V.:489.2; IV Piggyback:150] Out: 2150 [Urine:2150] Intake/Output this shift:    General appearance: alert and cooperative Resp: clear to auscultation bilaterally Cardio: regular rate and rhythm GI: soft, tender RUQ Neurologic: Mental status: Alert, oriented, thought content appropriate  Lab Results:   Recent Labs  05/23/14 0620 05/24/14 0415  WBC 18.3* 12.3*  HGB 13.1 12.1*  HCT 40.7 38.4*  PLT 197 196   BMET  Recent Labs  05/23/14 0620 05/24/14 0415  NA 132* 137  K 4.0 3.6*  CL 92* 95*  CO2 23 30  GLUCOSE 115* 96  BUN 12 14  CREATININE 0.73 0.74  CALCIUM 8.8 9.0   PT/INR  Recent Labs  05/22/14 0445 05/23/14 0620  LABPROT 20.5* 15.8*  INR 1.76* 1.26   ABG No results found for this basename: PHART, PCO2, PO2, HCO3,  in the last 72 hours  Studies/Results: Dg Chest Port 1 View  05/22/2014   CLINICAL DATA:  Preop for cholecystectomy. Hypertension. Diabetes. Ex-smoker. Right upper quadrant pain.  EXAM: PORTABLE CHEST - 1 VIEW  COMPARISON:  Acute abdomen series of 05/21/2014 and chest radiograph of 10/21/2008. Abdominal CT 05/21/2014.  FINDINGS: Tracheostomy is appropriately positioned. Patient rotated right. Cardiomegaly accentuated by AP portable technique. Small right pleural effusion is likely similar to yesterday's CT. No pneumothorax. No congestive failure. Patchy right base airspace disease.  IMPRESSION: Small right pleural effusion and adjacent atelectasis versus early infection. Consider short term radiographic followup.  Cardiomegaly and low lung volumes,  without congestive failure.   Electronically Signed   By: Abigail Miyamoto M.D.   On: 05/22/2014 16:20    Anti-infectives: Anti-infectives   Start     Dose/Rate Route Frequency Ordered Stop   05/21/14 2200  piperacillin-tazobactam (ZOSYN) IVPB 3.375 g     3.375 g 12.5 mL/hr over 240 Minutes Intravenous 3 times per day 05/21/14 2054        Assessment/Plan: Cholecystitis - for lap chole/IOC this AM. Procedure, risks, benefits again D/W him and he is agreeable.  LOS: 3 days    THOMPSON,BURKE E 05/24/2014

## 2014-05-24 NOTE — Anesthesia Preprocedure Evaluation (Addendum)
Anesthesia Evaluation  Patient identified by MRN, date of birth, ID band Patient awake    Reviewed: Allergy & Precautions, H&P , NPO status , Patient's Chart, lab work & pertinent test results, reviewed documented beta blocker date and time   History of Anesthesia Complications Negative for: history of anesthetic complications  Airway Mallampati: II TM Distance: >3 FB Neck ROM: Full    Dental  (+) Teeth Intact, Dental Advisory Given   Pulmonary sleep apnea (in process of CPAP titration, no mask yet) , former smoker, PE breath sounds clear to auscultation  Pulmonary exam normal       Cardiovascular hypertension, Pt. on medications and Pt. on home beta blockers DVT ('12) Rhythm:Regular Rate:Normal     Neuro/Psych Lumbar stenosis    GI/Hepatic GERD-  Medicated and Controlled,Elevated LFTs with acute chole N/v with Acute chole   Endo/Other  diabetes (glu 92), Oral Hypoglycemic AgentsMorbid obesity  Renal/GU negative Renal ROS     Musculoskeletal   Abdominal (+) + obese,   Peds  Hematology  (+) Blood dyscrasia (Coumadin stopped 6/25, INR 1.26), ,   Anesthesia Other Findings   Reproductive/Obstetrics                          Anesthesia Physical Anesthesia Plan  ASA: III  Anesthesia Plan: General   Post-op Pain Management:    Induction: Intravenous and Rapid sequence  Airway Management Planned: Oral ETT  Additional Equipment:   Intra-op Plan:   Post-operative Plan: Extubation in OR  Informed Consent: I have reviewed the patients History and Physical, chart, labs and discussed the procedure including the risks, benefits and alternatives for the proposed anesthesia with the patient or authorized representative who has indicated his/her understanding and acceptance.   Dental advisory given  Plan Discussed with: CRNA and Surgeon  Anesthesia Plan Comments: (Plan routine monitors, GETA)         Anesthesia Quick Evaluation

## 2014-05-24 NOTE — Progress Notes (Addendum)
PROGRESS NOTE  Nicolas [name] AGT:[id number] DOB: 02-10-1966 DOA: 05/21/2014 PCP: Nicolas Lennox, MD  Brief history  48 year old male with a history of diabetes mellitus, recurrent PE (on warfarin), spinal stenosis, and hypertension presented with 2-3 day history of lower abdominal pain with associated nausea and vomiting. At the time of admission, CT of abdomen and pelvis revealed a hydropic gallbladder with wall thickening and inflammatory stranding. RUQ ultrasound confirmed gallbladder wall thickening with gallbladder sludge and debris. The patient was admitted by the surgical team. The patient was started on intravenous Zosyn. Because of the patient's coagulopathy from warfarin, the patient was given vitamin K x2 doses. He was recently started on a heparin drip until surgical intervention could be performed.  Assessment/Plan:  Acute acalculous cholecystitis  -RUQ u/s--gallbladder wall thickening with debris and sludge in gallbladder lumen  -CT abdomen and pelvis negative for bowel obstruction but shows hydropic gallbladder with wall thickening and inflammatory stranding  -Continue Zosyn  -plans noted for cholecystectomy 05/24/14 Hypoxemia  -Likely combination hypoventilation from splinted breathing from pain, fluid overload and OSA -Given furosemide 40 mg IV x1-->neg 2.1L->improving oxygenation  -Saline lock IV fluids  -6/27 CXR--small R-pleural effusion with adjacent atelectasis  -Presently without any distress on 2 L-->94%  -echocardiogram--EF of 22-48%, grade 2 diastolic dysfunction  -check pro-BNP--463  -pt also has diagnosed sleep apnea-->unable to afford CPAP machine  History of recurrent PE  -Patient given vitamin K x 2  -Started patient on heparin drip pending surgical intervention  -pt with IVC filter  -Restart Coumadin when cleared by surgery after cholecystectomy  Diabetes mellitus type 2  -05/22/2014 hemoglobin A1c 6.0  -Discontinue metformin during her  inpatient hospitalization  -NovoLog sliding scale every 4 hours--change to moderate scale due to npo  -CBGs controlled  Hypertension  -Continue metoprolol succinate  -Elevated blood pressure partly due to uncontrolled pain  Tachycardia  -Likely due to pain and acute medical condition -improving with treatement -Continue metoprolol succinate  -EKG--sinus tachycardia without ST -T wave changes  Hx of tobacco use  -quit 13 years ago  Depression  -continue zoloft  Family Communication: No family beside  Disposition Plan: Home with daughter Antibiotics:  Zosyn 6/26-->     Procedures/Studies: Mr Thoracic Spine W Wo Contrast  05/14/2014   CLINICAL DATA:  History of T6-8 decompression. Paraplegia and incontinence.  EXAM: MRI THORACIC SPINE WITHOUT AND WITH CONTRAST  TECHNIQUE: Multiplanar and multiecho pulse sequences of the thoracic spine were obtained without and with intravenous contrast.  CONTRAST:  52mL MULTIHANCE GADOBENATE DIMEGLUMINE 529 MG/ML IV SOLN  COMPARISON:  MRI thoracic spine 12/03/2013.  FINDINGS: Multilevel Schmorl's nodes are again seen. The patient status post extensive posterior decompression as detailed in a low. Focal edema within the thoracic cord at T8-9 is again identified and does not appear markedly changed. There may also be a focus of cord signal abnormality at the T6-7 level is may be new since the prior exam. Thoracic cord signal is otherwise unremarkable.  Since the prior examination, it appears the patient has undergone revision of decompression from T6-7 to approximately T7-8 since the prior examination. There is a fluid collection in the midline of the back tracking along the laminectomy sites. The collection measures 10.3 cm craniocaudal with its largest component in the axial plane at T6-7 where it measures 2.3 by 0.9 cm. There is a smaller collection the deep subcutaneous tissues measures 7.9 cm craniocaudal by up to 1.4 cm AP by  0.4 cm transverse. These  collections likely represent seromas.  C7-T1: Shallow central protrusion indents the ventral thecal sac without obvious central canal or foraminal stenosis.  T1-2: No change in a shallow disc bulge without central canal or foraminal narrowing.  T2-3: There is facet arthropathy and ligamentum flavum thickening. The central canal and foramina appear open.  T3-4: Status post posterior decompression. There is facet degenerative disease. The central canal and foramina appear open.  T4-5: Status post posterior decompression. Central canal and foramina appear open. Small T2 hyperintense collection eccentric to the right is seen in the sagittal plane only measuring 0.7 cm craniocaudal. This may be a synovial cyst off the right facet joint. It is unchanged.  T5-6: Status post posterior decompression. Facet arthropathy is present. The central canal and foramina appear open.  T6-7: The patient is status post decompression. Facet arthropathy and ligamentum flavum thickening appear unchanged. Facet arthropathy is noted.  T7-8: Revision of posterior decompression since the prior exam. Central canal stenosis appears improved although ligamentum flavum thickening and facet arthropathy continue to deform the cord. Foramina are open.  T8-9: Status post decompression. The central canal and foramina appear open.  T9-10: Status post posterior decompression. The central canal and foramina appear open.  T10-11: Status post posterior decompression. There is ligamentum flavum thickening and facet arthropathy. The central canal appears open. Foramina appear narrowed. This level is stable.  T11-12: Ligamentum flavum thickening and facet arthropathy are identified. Central canal stenosis appears unchanged.  T12-L1: This level is imaged in the sagittal plane only and unremarkable.  IMPRESSION: The patient is status post interval revision of posterior decompression at T7-8. The appearance of the T7-8 level is improved although facet arthropathy  and ligamentum flavum thickening continue to deform the cord.  Possible subtle focus of edema within the cord at T6-7 mid and present on the prior study but is better seen today.  Fluid collection extending from mid T6 to the T9-10 level likely represents a postoperative seroma as detailed above. A second more superficial collection also likely represents postoperative seroma. CSF leak or abscess are less likely but cannot be excluded.  Except as described above, the patient's examination is unchanged with edema again seen within the cord at T8-9 and multilevel posterior decompression identified.   Electronically Signed   By: Inge Rise M.D.   On: 05/14/2014 16:49   US Abdomen Complete  05/21/2014   CLINICAL DATA:  Nausea, vomiting, leukocytosis. Hydropic gallbladder with thickened wall on CT.  EXAM: ULTRASOUND ABDOMEN COMPLETE  COMPARISON:  CT abdomen and pelvis 05/21/2014. Abdominal ultrasound 10/26/2004.  FINDINGS: Examination was limited by patient body habitus.  Gallbladder:  The gallbladder wall is prominently thickened, measuring 9 mm in thickness. Debris/ sludge was noted in the lumen. No definite shadowing gallstones were identified. Sonographic Murphy's sign was negative.  Common bile duct:  Diameter: 5 mm  Liver:  Diffusely echogenic, compatible with steatosis.  IVC:  Not visualized.  Pancreas:  Not visualized.  Spleen:  Size and appearance within normal limits.  Right Kidney:  Length: 12.2 cm.  Not well visualized.  No gross hydronephrosis.  Left Kidney:  Length: 11.0 cm. Echogenicity within normal limits. No mass or hydronephrosis visualized.  Abdominal aorta:  Lower abdominal aorta not visualized. No aneurysm identified proximally.  Other findings:  None.  IMPRESSION: 1. Prominent gallbladder thickening with internal debris/sludge. No gallstones were identified and sonographic Murphy's sign was negative, however acute acalculous cholecystitis cannot be excluded in the appropriate clinical  setting.  2. Hepatic steatosis.   Electronically Signed   By: Logan Bores   On: 05/21/2014 18:13   Ct Abdomen Pelvis W Contrast  05/21/2014   CLINICAL DATA:  Hyperglycemia. Evaluate for ileus or small-bowel obstruction. Diffuse abdominal pain.  EXAM: CT ABDOMEN AND PELVIS WITH CONTRAST  TECHNIQUE: Multidetector CT imaging of the abdomen and pelvis was performed using the standard protocol following bolus administration of intravenous contrast.  CONTRAST:  152mL OMNIPAQUE IOHEXOL 300 MG/ML  SOLN  COMPARISON:  Abdominal radiographs 05/21/2014 and CT 11/27/2013  FINDINGS: Small, chronic right pleural effusion is unchanged. Mild bibasilar atelectasis/ scarring is again seen.  Diffusely low attenuation of the liver is again noted consistent with steatosis. The gallbladder appears mildly hydropic with evidence of wall thickening and mild fat stranding medial to it. No definite gallstones are identified. No biliary dilatation is seen. 1.5 cm cyst in the lower pole the left kidney is unchanged. The spleen, adrenal glands, right kidney, and pancreas are unremarkable.  Oral contrast is present in nondilated loops of small bowel. The colon is nondilated as well. There is no evidence of bowel obstruction. The bladder is unremarkable. A few small periportal lymph nodes are unchanged from the prior CT. There is no intraperitoneal free fluid or free air.  An infrarenal IVC filter is again seen with associated collapse of the IVC at and below the filter and with 1 of the filter's tines again noted to extend into the posterior aspect of the abdominal aorta, unchanged. Multiple collateral vessels are again noted in the anterior abdominal wall and visualized lower chest wall. Sequelae of prior laminectomies are again noted in the thoracic and lower lumbar spine.  IMPRESSION: 1. Mildly hydropic gallbladder with new wall thickening and mild surrounding inflammatory stranding. Further evaluation with right upper quadrant ultrasound is  suggested to evaluate for acute cholecystitis. 2. No evidence of bowel obstruction. 3. Hepatic steatosis. 4. Small, chronic right pleural effusion.   Electronically Signed   By: Logan Bores   On: 05/21/2014 16:30   Dg Chest Port 1 View  05/22/2014   CLINICAL DATA:  Preop for cholecystectomy. Hypertension. Diabetes. Ex-smoker. Right upper quadrant pain.  EXAM: PORTABLE CHEST - 1 VIEW  COMPARISON:  Acute abdomen series of 05/21/2014 and chest radiograph of 10/21/2008. Abdominal CT 05/21/2014.  FINDINGS: Tracheostomy is appropriately positioned. Patient rotated right. Cardiomegaly accentuated by AP portable technique. Small right pleural effusion is likely similar to yesterday's CT. No pneumothorax. No congestive failure. Patchy right base airspace disease.  IMPRESSION: Small right pleural effusion and adjacent atelectasis versus early infection. Consider short term radiographic followup.  Cardiomegaly and low lung volumes, without congestive failure.   Electronically Signed   By: Abigail Miyamoto M.D.   On: 05/22/2014 16:20   Dg Abd Acute W/chest  05/21/2014   CLINICAL DATA:  Constipation, shortness of breath.  EXAM: ACUTE ABDOMEN SERIES (ABDOMEN 2 VIEW & CHEST 1 VIEW)  COMPARISON:  10/21/2008 chest x-ray  FINDINGS: Lungs are hypoinflated with subtle density of the lateral right base likely scarring/atelectasis versus small amount of pleural fluid. There is stable cardiomegaly. Fusion hardware is present over the cervical spine unchanged. There is mild degenerative change of the spine.  Abdominal images demonstrate air throughout the colon. There are several air-filled minimally dilated small bowel loops present. There is no free air. There are mild degenerative changes of the hips. IVC filter is in adequate position.  IMPRESSION: Several air-filled mildly dilated small bowel loops with air seen throughout the colon.  Findings may be due to ileus versus early/partial small bowel obstructive process.  Hypoinflation  with minimal lateral right basilar density likely scarring/atelectasis versus small amount of right pleural fluid.  Cardiomegaly.   Electronically Signed   By: Marin Olp M.D.   On: 05/21/2014 12:46         Subjective: Patient complains of abdominal pain although improved from the day of admission. Denies any fevers, chills, chest pain, this breath, nausea, vomiting, diarrhea, dysuria. No bowel movements.  Objective: Filed Vitals:   05/23/14 0432 05/23/14 1410 05/23/14 2215 05/24/14 0536  BP: 123/61 105/97 108/52 120/65  Pulse: 95 91 92 89  Temp: 97.8 F (36.6 C) 97.7 F (36.5 C) 98.2 F (36.8 C) 97.9 F (36.6 C)  TempSrc: Oral Oral Oral   Resp: 18 16 18 17   Height:      Weight:      SpO2: 98% 98% 95% 94%    Intake/Output Summary (Last 24 hours) at 05/24/14 0736 Last data filed at 05/24/14 0615  Gross per 24 hour  Intake 739.17 ml  Output   2150 ml  Net -1410.83 ml   Weight change:  Exam:   General:  Pt is alert, follows commands appropriately, not in acute distress  HEENT: No icterus, No thrush,  [place]/AT  Cardiovascular: RRR, S1/S2, no rubs, no gallops  Respiratory: Bibasilar crackles. No wheezing. Good air movement.  Abdomen: Soft/+BS, RUQ and epigastric tender, non distended, no guarding  Extremities: 1+ edema, No lymphangitis, No petechiae, No rashes, no synovitis  Data Reviewed: Basic Metabolic Panel:  Recent Labs Lab 05/21/14 1146 05/22/14 0445 05/23/14 0620 05/24/14 0415  NA 135* 137 132* 137  K 3.5* 3.7 4.0 3.6*  CL 93* 96 92* 95*  CO2 27 26 23 30   GLUCOSE 161* 146* 115* 96  BUN 4* 5* 12 14  CREATININE 0.60 0.58 0.73 0.74  CALCIUM 9.6 9.4 8.8 9.0   Liver Function Tests:  Recent Labs Lab 05/22/14 0445 05/23/14 0620 05/24/14 0415  AST 126* 83* 71*  ALT 115* 110* 102*  ALKPHOS 115 113 114  BILITOT 4.1* 3.7* 2.5*  PROT 8.1 7.1 7.0  ALBUMIN 3.3* 2.6* 2.3*    Recent Labs Lab 05/21/14 1146 05/22/14 0445  LIPASE 19 13   No  results found for this basename: AMMONIA,  in the last 168 hours CBC:  Recent Labs Lab 05/21/14 1146 05/22/14 0445 05/23/14 0620 05/24/14 0415  WBC 15.5* 21.2* 18.3* 12.3*  NEUTROABS 12.8*  --   --   --   HGB 15.1 13.9 13.1 12.1*  HCT 47.3 42.8 40.7 38.4*  MCV 84.9 82.8 83.7 85.5  PLT 238 216 197 196   Cardiac Enzymes: No results found for this basename: CKTOTAL, CKMB, CKMBINDEX, TROPONINI,  in the last 168 hours BNP: No components found with this basename: POCBNP,  CBG:  Recent Labs Lab 05/23/14 1244 05/23/14 1632 05/23/14 2000 05/24/14 0003 05/24/14 0402  GLUCAP 107* 102* 92 87 92    Recent Results (from the past 240 hour(s))  SURGICAL PCR SCREEN     Status: None   Collection Time    05/22/14 10:20 AM      Result Value Ref Range Status   MRSA, PCR NEGATIVE  NEGATIVE Final   Staphylococcus aureus NEGATIVE  NEGATIVE Final   Comment:            The Xpert SA Assay (FDA     approved for NASAL specimens     in patients over 21  years of age),     is one component of     a comprehensive surveillance     program.  Test performance has     been validated by [employer] for patients greater     than or equal to 59 year old.     It is not intended     to diagnose infection nor to     guide or monitor treatment.     Scheduled Meds: . chlorhexidine  1 application Topical Once  . insulin aspart  0-20 Units Subcutaneous 6 times per day  . metoprolol succinate  50 mg Oral Daily  . pantoprazole (PROTONIX) IV  40 mg Intravenous QHS  . piperacillin-tazobactam (ZOSYN)  IV  3.375 g Intravenous 3 times per day  . sertraline  100 mg Oral Daily  . torsemide  20 mg Oral Daily  . traZODone  100 mg Oral QHS   Continuous Infusions:    TAT, DAVID, DO  [facility] Pager [phone]  If 7PM-7AM, please contact night-coverage [email or web address] Password [facility] 05/24/2014, 7:36 AM   LOS: 3 days

## 2014-05-24 NOTE — Transfer of Care (Signed)
Immediate Anesthesia Transfer of Care Note  Patient: Nicolas [name]  Procedure(s) Performed: Procedure(s): LAPAROSCOPIC CHOLECYSTECTOMY (N/A)  Patient Location: PACU  Anesthesia Type:General  Level of Consciousness: awake, alert , oriented and patient cooperative  Airway & Oxygen Therapy: Patient Spontanous Breathing and Patient connected to nasal cannula oxygen  Post-op Assessment: Report given to PACU RN and Post -op Vital signs reviewed and stable  Post vital signs: Reviewed and stable  Complications: No apparent anesthesia complications

## 2014-05-24 NOTE — Op Note (Signed)
05/21/2014 - 05/24/2014  11:17 AM  PATIENT:  Nicolas [name]  48 y.o. male  PRE-OPERATIVE DIAGNOSIS:  Acalculous cholecystitis  POST-OPERATIVE DIAGNOSIS:  Gangrenous cholecystitis  PROCEDURE:  Procedure(s): LAPAROSCOPIC CHOLECYSTECTOMY  SURGEON:  Georganna Skeans, M.D.  ASSISTANTS: Judeth Horn, M.D.   ANESTHESIA:   local and general  EBL:  Total I/O In: 1000 [I.V.:1000] Out: -   BLOOD ADMINISTERED:none  DRAINS: (1) Jackson-Pratt drain(s) with closed bulb suction in the liver bed   SPECIMEN:  Excision  DISPOSITION OF SPECIMEN:  PATHOLOGY  COUNTS:  YES  DICTATION: .Dragon Dictation  Findings: Severe gangrenous cholecystitis Complications: Pinhole in CBD near the cystic duct Procedure in detail: Patient was admitted with acute acalculus cholecystitis. Anticoagulation was reversed. He's been Receiving IV antibiotics. He is brought for cholecystectomy. Informed consent was obtained. Patient was identified in the preop holding area. He was to the operating room. General endotracheal anesthesia was a Company secretary by the anesthesia staff. His abdomen was prepped and draped in sterile fashion. Supraumbilical region was infiltrated with local. Supraumbilical incision was made. Subcutaneous tissues were dissected down revealing the anterior fascia. This was divided sharply along the midline. Peritoneal cavity was entered Under direct vision. 0 Vicryl pursestring was placed on the fascial opening. Hassan trocar was inserted. Abdomen was insufflated with carbon dioxide in standard fashion. Under direct vision, 5 mm epigastric and 5 mm right abdominal port x2 were placed. Local was used at each port site. The gallbladder was encased in omentum. This was carefully brought down using cautery for hemostasis. The body of the gallbladder was grasped and retracted superior medially. The infundibulum was retracted inferior laterally. There was a lot of dense inflammation and gangrene involving almost 50% of  the gallbladder. It spontaneously draining dark bile and this was evacuated. Dissection carefully began laterally and progressed medially. Initially, we encountered a tubular structure entering the gallbladder. This is likely a branch of the cystic artery. It was clipped twice proximally once distally and divided. Further dissection revealed the cystic duct. This was carefully dissected. It was far too short to do a cholangiogram. It was dissected until we had a view around it. As much of a critical view that we could. It was clipped twice proximally once distally and divided. We then freed up the gallbladder medially and laterally. We were unable to visualize structures further due to the inflammation and orientation for decision was made to do a dome down technique. The dome the gallbladder was freed up from the liver with cautery. The back wall was nearly entirely necrotic. This was peeled down from the liver bed using cautery. Once we got down to the other area we were able to complete removal of the gallbladder. During this dissection, we noted a small spot of bile staining near the cystic duct which was clipped. There appears to be a tiny, thinned out area of the common bile duct with a pinhole leaking some bile. It is uncertain how this occurred, likely via traction on adherent tissues. It was very inflamed and stuck in that area. Due to this inflammation, and the small size, it did not feel prudent to attempt suturing. We placed a 50 [place] Blake drain in the liver bed. The gallbladder was placed in an Ecosac and removed from the abdomen via the subumbilical port site. We had 2 large the fascial opening slightly liver bed was rechecked and copiously irrigated. Drain is in good position. There was no bleeding. There remained occasional bile stain from the pinhole  as described above. Drain was sutured to the skin. Pneumoperitoneum was released. Ports were removed. The supraumbilical fascia was closed by tying  the pursestring. I placed an additional figure-of-eight 0 Vicryl suture to close the fascia well. Wounds were copiously irrigated and then closed with running 4 Vicryl subcuticular followed by Dermabond. All counts were correct. Patient tolerated procedure well without apparent complication as listed above. We'll plan for gastroenterology consultation for ERCP and possible stent placement if leakage is identified. He was taken recovery in stable condition.  PATIENT DISPOSITION:  PACU - hemodynamically stable.   Delay start of Pharmacological VTE agent (>24hrs) due to surgical blood loss or risk of bleeding:  no  Georganna Skeans, MD, MPH, FACS Pager: [phone]  6/29/201511:17 AM

## 2014-05-24 NOTE — Anesthesia Postprocedure Evaluation (Signed)
  Anesthesia Post-op Note  Patient: Nicolas [name]  Procedure(s) Performed: Procedure(s): LAPAROSCOPIC CHOLECYSTECTOMY (N/A)  Patient Location: PACU  Anesthesia Type:General  Level of Consciousness: awake, alert , oriented and patient cooperative  Airway and Oxygen Therapy: Patient Spontanous Breathing and Patient connected to nasal cannula oxygen  Post-op Pain: mild  Post-op Assessment: Post-op Vital signs reviewed, Patient's Cardiovascular Status Stable, Respiratory Function Stable, Patent Airway, No signs of Nausea or vomiting and Pain level controlled  Post-op Vital Signs: Reviewed and stable  Last Vitals:  Filed Vitals:   05/24/14 1237  BP: 128/61  Pulse: 105  Temp: 36.5 C  Resp: 20    Complications: No apparent anesthesia complications

## 2014-05-24 NOTE — ED Provider Notes (Signed)
Medical screening examination/treatment/procedure(s) were conducted as a shared visit with non-physician practitioner(s) and myself.  I personally evaluated the patient during the encounter.  Pt with hx diabetes, c/o high bloody sugar.  On exam nausea. Also c/o mid to lower abd discomfort. Labs. Imaging. Baird Kay, MD 05/24/14 [phone]

## 2014-05-24 NOTE — Progress Notes (Signed)
Additional dilaudid .5 mg given for pain 6/10

## 2014-05-24 NOTE — Consult Note (Addendum)
Unassigned patient Reason for Consult: ?Bile leak.  Referring Physician: CCS  Nicolas [name] is an 48 y.o. male.  HPI: 48 year old, morbidly obese, white male, with multiple medical issues listed below, underwent a laparoscopic cholecystectomy earlier today for a gangrenous gallbladder. There was a ?bile leak. Therefore ERCP is being requested for a possible stent placement. Patient had just returned from the OR and was not able to give me much history.   Past Medical History  Diagnosis Date  . Arthritis   . Clotting disorder   . Hypertension   . Pulmonary embolism     x 3 in 2005, 2008, 2010  . DVT (deep venous thrombosis)     Summer 2012  . Spinal stenosis   . Depression   . Sleep apnea   . Diabetes mellitus without complication        Morbid obesity Past Surgical History  Procedure Laterality Date  . Spine surgery  11/30/11    Total 6 back surgeries   Family History  Problem Relation Age of Onset  . Cancer Mother 62    Lung  . Hypertension Father   . Diabetes Father   . Prostate cancer Father   . Hypertension Father   . Hypertension Brother   . Hypertension Brother   . Diabetes Brother    Social History:  reports that he quit smoking about 13 years ago. His smoking use included Cigarettes. He has a 15 pack-year smoking history. He has never used smokeless tobacco. He reports that he drinks about .5 ounces of alcohol per week. He reports that he does not use illicit drugs.  Allergies:  Allergies  Allergen Reactions  . Lisinopril Other (See Comments)    cough  . Ace Inhibitors     cough   Medications: I have reviewed the patient's current medications.  Results for orders placed during the hospital encounter of 05/21/14 (from the past 48 hour(s))  GLUCOSE, CAPILLARY     Status: Abnormal   Collection Time    05/22/14  8:02 PM      Result Value Ref Range   Glucose-Capillary 147 (*) 70 - 99 mg/dL  HEPARIN LEVEL (UNFRACTIONATED)     Status: Abnormal   Collection  Time    05/22/14 10:34 PM      Result Value Ref Range   Heparin Unfractionated <0.10 (*) 0.30 - 0.70 IU/mL   Comment:            IF HEPARIN RESULTS ARE BELOW     EXPECTED VALUES, AND PATIENT     DOSAGE HAS BEEN CONFIRMED,     SUGGEST FOLLOW UP TESTING     OF ANTITHROMBIN III LEVELS.  GLUCOSE, CAPILLARY     Status: Abnormal   Collection Time    05/22/14 11:48 PM      Result Value Ref Range   Glucose-Capillary 126 (*) 70 - 99 mg/dL  GLUCOSE, CAPILLARY     Status: Abnormal   Collection Time    05/23/14  4:30 AM      Result Value Ref Range   Glucose-Capillary 122 (*) 70 - 99 mg/dL  PROTIME-INR     Status: Abnormal   Collection Time    05/23/14  6:20 AM      Result Value Ref Range   Prothrombin Time 15.8 (*) 11.6 - 15.2 seconds   INR 1.26  0.00 - 1.49  COMPREHENSIVE METABOLIC PANEL     Status: Abnormal   Collection Time    05/23/14  6:20 AM      Result Value Ref Range   Sodium 132 (*) 137 - 147 mEq/L   Potassium 4.0  3.7 - 5.3 mEq/L   Chloride 92 (*) 96 - 112 mEq/L   CO2 23  19 - 32 mEq/L   Glucose, Bld 115 (*) 70 - 99 mg/dL   BUN 12  6 - 23 mg/dL   Creatinine, Ser 0.73  0.50 - 1.35 mg/dL   Calcium 8.8  8.4 - 10.5 mg/dL   Total Protein 7.1  6.0 - 8.3 g/dL   Albumin 2.6 (*) 3.5 - 5.2 g/dL   AST 83 (*) 0 - 37 U/L   ALT 110 (*) 0 - 53 U/L   Alkaline Phosphatase 113  39 - 117 U/L   Total Bilirubin 3.7 (*) 0.3 - 1.2 mg/dL   GFR calc non Af Amer >90  >90 mL/min   GFR calc Af Amer >90  >90 mL/min   Comment: (NOTE)     The eGFR has been calculated using the CKD EPI equation.     This calculation has not been validated in all clinical situations.     eGFR's persistently <90 mL/min signify possible Chronic Kidney     Disease.  CBC     Status: Abnormal   Collection Time    05/23/14  6:20 AM      Result Value Ref Range   WBC 18.3 (*) 4.0 - 10.5 K/uL   RBC 4.86  4.22 - 5.81 MIL/uL   Hemoglobin 13.1  13.0 - 17.0 g/dL   HCT 40.7  39.0 - 52.0 %   MCV 83.7  78.0 - 100.0 fL   MCH  27.0  26.0 - 34.0 pg   MCHC 32.2  30.0 - 36.0 g/dL   RDW 16.7 (*) 11.5 - 15.5 %   Platelets 197  150 - 400 K/uL  PRO B NATRIURETIC PEPTIDE     Status: Abnormal   Collection Time    05/23/14  6:20 AM      Result Value Ref Range   Pro B Natriuretic peptide (BNP) 463.3 (*) 0 - 125 pg/mL  GLUCOSE, CAPILLARY     Status: Abnormal   Collection Time    05/23/14  7:49 AM      Result Value Ref Range   Glucose-Capillary 124 (*) 70 - 99 mg/dL  GLUCOSE, CAPILLARY     Status: Abnormal   Collection Time    05/23/14 12:44 PM      Result Value Ref Range   Glucose-Capillary 107 (*) 70 - 99 mg/dL  GLUCOSE, CAPILLARY     Status: Abnormal   Collection Time    05/23/14  4:32 PM      Result Value Ref Range   Glucose-Capillary 102 (*) 70 - 99 mg/dL  GLUCOSE, CAPILLARY     Status: None   Collection Time    05/23/14  8:00 PM      Result Value Ref Range   Glucose-Capillary 92  70 - 99 mg/dL   Comment 1 Notify RN     Comment 2 Documented in Chart    HEPARIN LEVEL (UNFRACTIONATED)     Status: Abnormal   Collection Time    05/23/14  8:10 PM      Result Value Ref Range   Heparin Unfractionated <0.10 (*) 0.30 - 0.70 IU/mL   Comment:            IF HEPARIN RESULTS ARE BELOW     EXPECTED VALUES, AND  PATIENT     DOSAGE HAS BEEN CONFIRMED,     SUGGEST FOLLOW UP TESTING     OF ANTITHROMBIN III LEVELS.  GLUCOSE, CAPILLARY     Status: None   Collection Time    05/24/14 12:03 AM      Result Value Ref Range   Glucose-Capillary 87  70 - 99 mg/dL   Comment 1 Notify RN     Comment 2 Documented in Chart    GLUCOSE, CAPILLARY     Status: None   Collection Time    05/24/14  4:02 AM      Result Value Ref Range   Glucose-Capillary 92  70 - 99 mg/dL   Comment 1 Notify RN     Comment 2 Documented in Chart    CBC     Status: Abnormal   Collection Time    05/24/14  4:15 AM      Result Value Ref Range   WBC 12.3 (*) 4.0 - 10.5 K/uL   RBC 4.49  4.22 - 5.81 MIL/uL   Hemoglobin 12.1 (*) 13.0 - 17.0 g/dL   HCT  38.4 (*) 39.0 - 52.0 %   MCV 85.5  78.0 - 100.0 fL   MCH 26.9  26.0 - 34.0 pg   MCHC 31.5  30.0 - 36.0 g/dL   RDW 16.7 (*) 11.5 - 15.5 %   Platelets 196  150 - 400 K/uL  COMPREHENSIVE METABOLIC PANEL     Status: Abnormal   Collection Time    05/24/14  4:15 AM      Result Value Ref Range   Sodium 137  137 - 147 mEq/L   Potassium 3.6 (*) 3.7 - 5.3 mEq/L   Chloride 95 (*) 96 - 112 mEq/L   CO2 30  19 - 32 mEq/L   Glucose, Bld 96  70 - 99 mg/dL   BUN 14  6 - 23 mg/dL   Creatinine, Ser 0.74  0.50 - 1.35 mg/dL   Calcium 9.0  8.4 - 10.5 mg/dL   Total Protein 7.0  6.0 - 8.3 g/dL   Albumin 2.3 (*) 3.5 - 5.2 g/dL   AST 71 (*) 0 - 37 U/L   ALT 102 (*) 0 - 53 U/L   Alkaline Phosphatase 114  39 - 117 U/L   Total Bilirubin 2.5 (*) 0.3 - 1.2 mg/dL   GFR calc non Af Amer >90  >90 mL/min   GFR calc Af Amer >90  >90 mL/min   Comment: (NOTE)     The eGFR has been calculated using the CKD EPI equation.     This calculation has not been validated in all clinical situations.     eGFR's persistently <90 mL/min signify possible Chronic Kidney     Disease.  HEPARIN LEVEL (UNFRACTIONATED)     Status: Abnormal   Collection Time    05/24/14  4:15 AM      Result Value Ref Range   Heparin Unfractionated <0.10 (*) 0.30 - 0.70 IU/mL   Comment:            IF HEPARIN RESULTS ARE BELOW     EXPECTED VALUES, AND PATIENT     DOSAGE HAS BEEN CONFIRMED,     SUGGEST FOLLOW UP TESTING     OF ANTITHROMBIN III LEVELS.  GLUCOSE, CAPILLARY     Status: None   Collection Time    05/24/14  8:07 AM      Result Value Ref Range   Glucose-Capillary 89  70 - 99 mg/dL  GLUCOSE, CAPILLARY     Status: Abnormal   Collection Time    05/24/14 11:59 AM      Result Value Ref Range   Glucose-Capillary 123 (*) 70 - 99 mg/dL   Comment 1 Documented in Chart     Comment 2 Notify RN    GLUCOSE, CAPILLARY     Status: Abnormal   Collection Time    05/24/14  5:27 PM      Result Value Ref Range   Glucose-Capillary 121 (*) 70 - 99  mg/dL   No results found.  Review of Systems  Unable to perform ROS  Blood pressure 117/56, pulse 84, temperature 97.5 F (36.4 C), temperature source Oral, resp. rate 20, height '5\' 11"'  (1.803 m), weight 143 kg (315 lb 4.1 oz), SpO2 99.00%. Physical Exam  Constitutional:  Morbidly obese white male, just returned from the OR after a cholecystectomy    HENT:  Head: Normocephalic and atraumatic.  Eyes: Conjunctivae are normal. Pupils are equal, round, and reactive to light.  Cardiovascular: Normal rate and regular rhythm.   Respiratory: Effort normal and breath sounds normal.  GI: Soft.  Morbidly obese with  JP drain noted s/p lap cholecystectomy  Skin: Skin is warm and dry.   Assessment/Plan: 1) ?Bile leak noted during cholecystectomy; ERCP scheduled for tomorrow with possible stenting if needed. 2) History of pulmonary emboli and DVT's on chronic anticoagulation-Heparin was held before surgery today.  3) Morbid obesity.   MANN,JYOTHI 05/24/2014, 6:50 PM

## 2014-05-25 ENCOUNTER — Encounter (HOSPITAL_COMMUNITY): Admission: EM | Disposition: A | Discharge status: Home / Self Care | Payer: Self-pay | Source: Home / Self Care

## 2014-05-25 ENCOUNTER — Encounter (HOSPITAL_COMMUNITY): Payer: Self-pay | Admitting: General Surgery

## 2014-05-25 ENCOUNTER — Encounter (HOSPITAL_COMMUNITY)
Admission: EM | Disposition: A | Discharge status: Home / Self Care | Payer: Commercial Managed Care - HMO | Source: Home / Self Care

## 2014-05-25 ENCOUNTER — Inpatient Hospital Stay (HOSPITAL_COMMUNITY): Payer: Medicare HMO

## 2014-05-25 ENCOUNTER — Encounter (HOSPITAL_COMMUNITY): Payer: Medicare HMO | Admitting: Certified Registered"

## 2014-05-25 ENCOUNTER — Inpatient Hospital Stay (HOSPITAL_COMMUNITY): Payer: Medicare HMO | Admitting: Certified Registered"

## 2014-05-25 HISTORY — PX: ERCP: SHX5425

## 2014-05-25 LAB — COMPREHENSIVE METABOLIC PANEL
ALT: 95 U/L — ABNORMAL HIGH (ref 0–53)
AST: 69 U/L — ABNORMAL HIGH (ref 0–37)
Albumin: 2.3 g/dL — ABNORMAL LOW (ref 3.5–5.2)
Alkaline Phosphatase: 113 U/L (ref 39–117)
BUN: 10 mg/dL (ref 6–23)
CO2: 33 mEq/L — ABNORMAL HIGH (ref 19–32)
Calcium: 8.8 mg/dL (ref 8.4–10.5)
Chloride: 100 mEq/L (ref 96–112)
Creatinine, Ser: 0.59 mg/dL (ref 0.50–1.35)
GFR calc Af Amer: >90 mL/min (ref >90)
GFR calc non Af Amer: >90 mL/min (ref >90)
Glucose, Bld: 97 mg/dL (ref 70–99)
Potassium: 3.7 mEq/L (ref 3.7–5.3)
Sodium: 142 mEq/L (ref 137–147)
Total Bilirubin: 1.1 mg/dL (ref 0.3–1.2)
Total Protein: 7 g/dL (ref 6.0–8.3)

## 2014-05-25 LAB — CBC
HCT: 38.5 % — ABNORMAL LOW (ref 39.0–52.0)
Hemoglobin: 11.7 g/dL — ABNORMAL LOW (ref 13.0–17.0)
MCH: 27 pg (ref 26.0–34.0)
MCHC: 30.4 g/dL (ref 30.0–36.0)
MCV: 88.7 fL (ref 78.0–100.0)
Platelets: 214 10*3/uL (ref 150–400)
RBC: 4.34 MIL/uL (ref 4.22–5.81)
RDW: 17 % — ABNORMAL HIGH (ref 11.5–15.5)
WBC: 9.1 10*3/uL (ref 4.0–10.5)

## 2014-05-25 LAB — GLUCOSE, CAPILLARY
Glucose-Capillary: 103 mg/dL — ABNORMAL HIGH (ref 70–99)
Glucose-Capillary: 109 mg/dL — ABNORMAL HIGH (ref 70–99)
Glucose-Capillary: 118 mg/dL — ABNORMAL HIGH (ref 70–99)
Glucose-Capillary: 92 mg/dL (ref 70–99)
Glucose-Capillary: 94 mg/dL (ref 70–99)
Glucose-Capillary: 97 mg/dL (ref 70–99)
Glucose-Capillary: 99 mg/dL (ref 70–99)

## 2014-05-25 LAB — PROTIME-INR
INR: 1.13 (ref 0.00–1.49)
Prothrombin Time: 14.5 seconds (ref 11.6–15.2)

## 2014-05-25 SURGERY — ERCP, WITH INTERVENTION IF INDICATED
Anesthesia: General

## 2014-05-25 SURGERY — ERCP, WITH INTERVENTION IF INDICATED
Anesthesia: Moderate Sedation

## 2014-05-25 MED ORDER — OXYCODONE HCL 5 MG/5ML PO SOLN: 5.0000 mg | Freq: Once | ORAL | Status: DC | PRN

## 2014-05-25 MED ORDER — ONDANSETRON HCL 4 MG/2ML IJ SOLN
INTRAMUSCULAR | Status: DC | PRN
  Administered 2014-05-25: 4 mg via INTRAVENOUS

## 2014-05-25 MED ORDER — OXYCODONE HCL 5 MG PO TABS: 5.0000 mg | ORAL_TABLET | Freq: Once | ORAL | Status: DC | PRN

## 2014-05-25 MED ORDER — MIDAZOLAM HCL 2 MG/2ML IJ SOLN
INTRAMUSCULAR | Status: AC
  Filled 2014-05-25: qty 2

## 2014-05-25 MED ORDER — FENTANYL CITRATE 0.05 MG/ML IJ SOLN
INTRAMUSCULAR | Status: AC
  Filled 2014-05-25: qty 5

## 2014-05-25 MED ORDER — ROCURONIUM BROMIDE 50 MG/5ML IV SOLN
INTRAVENOUS | Status: AC
  Filled 2014-05-25: qty 1

## 2014-05-25 MED ORDER — SUCCINYLCHOLINE CHLORIDE 20 MG/ML IJ SOLN
INTRAMUSCULAR | Status: DC | PRN
  Administered 2014-05-25: 100 mg via INTRAVENOUS

## 2014-05-25 MED ORDER — PROPOFOL 10 MG/ML IV BOLUS
INTRAVENOUS | Status: DC | PRN
  Administered 2014-05-25: 200 mg via INTRAVENOUS

## 2014-05-25 MED ORDER — LACTATED RINGERS IV SOLN
INTRAVENOUS | Status: DC
  Administered 2014-05-25 – 2014-05-30 (×3): via INTRAVENOUS

## 2014-05-25 MED ORDER — PROMETHAZINE HCL 25 MG/ML IJ SOLN
6.2500 mg | INTRAMUSCULAR | Status: DC | PRN
Start: 2014-05-25 — End: 2014-05-25

## 2014-05-25 MED ORDER — SODIUM CHLORIDE 0.9 % IV SOLN
INTRAVENOUS | Status: DC | PRN
  Administered 2014-05-25: 16:00:00

## 2014-05-25 MED ORDER — MORPHINE SULFATE 2 MG/ML IJ SOLN: 1.0000 mg | INTRAMUSCULAR | Status: DC | PRN

## 2014-05-25 MED ORDER — FENTANYL CITRATE 0.05 MG/ML IJ SOLN
INTRAMUSCULAR | Status: DC | PRN
  Administered 2014-05-25: 100 ug via INTRAVENOUS
  Administered 2014-05-25: 50 ug via INTRAVENOUS

## 2014-05-25 MED ORDER — LIDOCAINE HCL (CARDIAC) 20 MG/ML IV SOLN
INTRAVENOUS | Status: DC | PRN
  Administered 2014-05-25: 100 mg via INTRAVENOUS

## 2014-05-25 MED ORDER — MIDAZOLAM HCL 5 MG/5ML IJ SOLN
INTRAMUSCULAR | Status: DC | PRN
  Administered 2014-05-25: 2 mg via INTRAVENOUS

## 2014-05-25 MED ORDER — ONDANSETRON HCL 4 MG/2ML IJ SOLN
INTRAMUSCULAR | Status: AC
  Filled 2014-05-25: qty 2

## 2014-05-25 NOTE — Interval H&P Note (Signed)
History and Physical Interval Note:  05/25/2014 2:28 PM  Nicolas [name]  has presented today for surgery, with the diagnosis of bile leak  The various methods of treatment have been discussed with the patient and family. After consideration of risks, benefits and other options for treatment, the patient has consented to  Procedure(s): ENDOSCOPIC RETROGRADE CHOLANGIOPANCREATOGRAPHY (ERCP) (N/A) as a surgical intervention .  The patient's history has been reviewed, patient examined, no change in status, stable for surgery.  I have reviewed the patient's chart and labs.  Questions were answered to the patient's satisfaction.     HUNG,PATRICK D

## 2014-05-25 NOTE — H&P (View-Only) (Signed)
1 Day Post-Op  Subjective: Mild soreness  Objective: Vital signs in last 24 hours: Temp:  [97.5 F (36.4 C)-98.4 F (36.9 C)] 98.1 F (36.7 C) (06/30 0523) Pulse Rate:  [80-105] 80 (06/30 0523) Resp:  [13-26] 18 (06/30 0523) BP: (106-174)/(53-78) 110/57 mmHg (06/30 0523) SpO2:  [96 %-100 %] 100 % (06/30 0523) Last BM Date: 05/20/14  Intake/Output from previous day: 06/29 0701 - 06/30 0700 In: 2051.3 [I.V.:2001.3; IV Piggyback:50] Out: 8416 [Urine:1050; Drains:260; Blood:75] Intake/Output this shift: Total I/O In: -  Out: 400 [Urine:400]  General appearance: alert and cooperative Resp: clear to auscultation bilaterally Cardio: regular rate and rhythm GI: soft, incisions CDI, JP serosang with ? bile tinge Neuro: A&O  Lab Results:   Recent Labs  05/23/14 0620 05/24/14 0415  WBC 18.3* 12.3*  HGB 13.1 12.1*  HCT 40.7 38.4*  PLT 197 196   BMET  Recent Labs  05/23/14 0620 05/24/14 0415  NA 132* 137  K 4.0 3.6*  CL 92* 95*  CO2 23 30  GLUCOSE 115* 96  BUN 12 14  CREATININE 0.73 0.74  CALCIUM 8.8 9.0   PT/INR  Recent Labs  05/23/14 0620  LABPROT 15.8*  INR 1.26   ABG No results found for this basename: PHART, PCO2, PO2, HCO3,  in the last 72 hours  Studies/Results: No results found.  Anti-infectives: Anti-infectives   Start     Dose/Rate Route Frequency Ordered Stop   05/21/14 2200  piperacillin-tazobactam (ZOSYN) IVPB 3.375 g     3.375 g 12.5 mL/hr over 240 Minutes Intravenous 3 times per day 05/21/14 2054        Assessment/Plan: s/p Procedure(s): LAPAROSCOPIC CHOLECYSTECTOMY (N/A) POD#1 Bile leak - for ERCP today, appreciate GI assistance DM/HTN/Hx PE - appreciate hospitalist service management VTE - coumadin/heparin once OK after GI procedure  LOS: 4 days    Keli Buehner E 05/25/2014

## 2014-05-25 NOTE — Anesthesia Postprocedure Evaluation (Signed)
  Anesthesia Post-op Note  Patient: Nicolas [name]  Procedure(s) Performed: Procedure(s): ENDOSCOPIC RETROGRADE CHOLANGIOPANCREATOGRAPHY (ERCP) (N/A)  Patient Location: PACU  Anesthesia Type:General  Level of Consciousness: awake and alert   Airway and Oxygen Therapy: Patient Spontanous Breathing  Post-op Pain: mild  Post-op Assessment: Post-op Vital signs reviewed  Post-op Vital Signs: stable  Last Vitals:  Filed Vitals:   05/25/14 1600  BP: 163/85  Pulse: 90  Temp:   Resp: 20    Complications: No apparent anesthesia complications

## 2014-05-25 NOTE — Anesthesia Procedure Notes (Signed)
Procedure Name: Intubation Date/Time: 05/25/2014 2:27 PM Performed by: Julian Reil Pre-anesthesia Checklist: Patient identified, Emergency Drugs available, Suction available and Patient being monitored Patient Re-evaluated:Patient Re-evaluated prior to inductionOxygen Delivery Method: Circle system utilized Preoxygenation: Pre-oxygenation with 100% oxygen Intubation Type: IV induction Ventilation: Mask ventilation with difficulty Laryngoscope Size: Mac and 4 Grade View: Grade II Tube type: Oral Tube size: 7.5 mm Number of attempts: 1 Airway Equipment and Method: Stylet Placement Confirmation: ETT inserted through vocal cords under direct vision,  positive ETCO2 and breath sounds checked- equal and bilateral Secured at: 22 cm Tube secured with: Tape Dental Injury: Teeth and Oropharynx as per pre-operative assessment

## 2014-05-25 NOTE — Transfer of Care (Signed)
Immediate Anesthesia Transfer of Care Note  Patient: Nicolas [name]  Procedure(s) Performed: Procedure(s): ENDOSCOPIC RETROGRADE CHOLANGIOPANCREATOGRAPHY (ERCP) (N/A)  Patient Location: PACU  Anesthesia Type:General  Level of Consciousness: awake, alert , oriented and patient cooperative  Airway & Oxygen Therapy: Patient Spontanous Breathing and Patient connected to face mask oxygen  Post-op Assessment: Report given to PACU RN, Post -op Vital signs reviewed and stable and Patient moving all extremities  Post vital signs: Reviewed and stable  Complications: No apparent anesthesia complications

## 2014-05-25 NOTE — Op Note (Signed)
[facility] [place], [id number]   ERCP PROCEDURE REPORT  PATIENT: Nicolas [name], Nicolas [name].  MR# :[id number] BIRTHDATE: 1965/12/11  GENDER: Male ENDOSCOPIST: Carol Ada, MD REFERRED BY: PROCEDURE DATE:  05/25/2014 PROCEDURE:   ERCP with stent placement ASA CLASS:   Class III INDICATIONS:Cystic duct leak. MEDICATIONS: MAC sedation, administered by CRNA TOPICAL ANESTHETIC: none  DESCRIPTION OF PROCEDURE:   After the risks benefits and alternatives of the procedure were thoroughly explained, informed consent was obtained.  The Pentax Ercp Scope [id number]  endoscope was introduced through the mouth  and advanced to the second portion of the duodenum.  The ampulla was secreting bile spontaneously. Cannulation of the CBD was moderately difficult.  The guidewire entered the PD twice and during the second cannulation it was left in place.  This facilitated the cannulation of the CBD and the PD wire was promptly removed.  Contrast injection of the CBD was negative for any duct leak, but there was no opacification of the cystic duct.  The CBD was 5-7 mm in diameter.  Even though there was no evidence of a bile leak, an 8.5 Fr x 5 cm stent was placed after a 5-8 mm sphincterotomy was created.  Excellent drainage of the CBD was achived.   The scope was then completely withdrawn from the patient and the procedure terminated.     COMPLICATIONS:  ENDOSCOPIC IMPRESSION: 1) No clear evidence of a bile leak.  RECOMMENDATIONS: 1) Stent removal in 4 weeks. 2) Routine post-op care.    _______________________________ eSignedCarol Ada, MD 05/25/2014 3:40 PM   CC:

## 2014-05-25 NOTE — Clinical Documentation Improvement (Signed)
  6/19 MR w/wo contrast states this patient is "wheelchair bound paraplegic with incontinence". These diagnoses are not addressed in H&P nor in PNs. If these apply to this patient please add to documentation to reflect severity of illness and risk of mortality. Thank you.  Thank You, Ezekiel Ina ,RN Clinical Documentation Specialist:  [phone]  [facility] Information Management

## 2014-05-25 NOTE — Progress Notes (Signed)
1 Day Post-Op  Subjective: Mild soreness  Objective: Vital signs in last 24 hours: Temp:  [97.5 F (36.4 C)-98.4 F (36.9 C)] 98.1 F (36.7 C) (06/30 0523) Pulse Rate:  [80-105] 80 (06/30 0523) Resp:  [13-26] 18 (06/30 0523) BP: (106-174)/(53-78) 110/57 mmHg (06/30 0523) SpO2:  [96 %-100 %] 100 % (06/30 0523) Last BM Date: 05/20/14  Intake/Output from previous day: 06/29 0701 - 06/30 0700 In: 2051.3 [I.V.:2001.3; IV Piggyback:50] Out: 9794 [Urine:1050; Drains:260; Blood:75] Intake/Output this shift: Total I/O In: -  Out: 400 [Urine:400]  General appearance: alert and cooperative Resp: clear to auscultation bilaterally Cardio: regular rate and rhythm GI: soft, incisions CDI, JP serosang with ? bile tinge Neuro: A&O  Lab Results:   Recent Labs  05/23/14 0620 05/24/14 0415  WBC 18.3* 12.3*  HGB 13.1 12.1*  HCT 40.7 38.4*  PLT 197 196   BMET  Recent Labs  05/23/14 0620 05/24/14 0415  NA 132* 137  K 4.0 3.6*  CL 92* 95*  CO2 23 30  GLUCOSE 115* 96  BUN 12 14  CREATININE 0.73 0.74  CALCIUM 8.8 9.0   PT/INR  Recent Labs  05/23/14 0620  LABPROT 15.8*  INR 1.26   ABG No results found for this basename: PHART, PCO2, PO2, HCO3,  in the last 72 hours  Studies/Results: No results found.  Anti-infectives: Anti-infectives   Start     Dose/Rate Route Frequency Ordered Stop   05/21/14 2200  piperacillin-tazobactam (ZOSYN) IVPB 3.375 g     3.375 g 12.5 mL/hr over 240 Minutes Intravenous 3 times per day 05/21/14 2054        Assessment/Plan: s/p Procedure(s): LAPAROSCOPIC CHOLECYSTECTOMY (N/A) POD#1 Bile leak - for ERCP today, appreciate GI assistance DM/HTN/Hx PE - appreciate hospitalist service management VTE - coumadin/heparin once OK after GI procedure  LOS: 4 days    THOMPSON,BURKE E 05/25/2014

## 2014-05-25 NOTE — Anesthesia Preprocedure Evaluation (Signed)
Anesthesia Evaluation  Patient identified by MRN, date of birth, ID band Patient awake    Reviewed: Allergy & Precautions, H&P , NPO status , Patient's Chart, lab work & pertinent test results, reviewed documented beta blocker date and time   History of Anesthesia Complications Negative for: history of anesthetic complications  Airway Mallampati: II TM Distance: >3 FB Neck ROM: Full    Dental  (+) Teeth Intact, Dental Advisory Given   Pulmonary sleep apnea (in process of CPAP titration, no mask yet) , former smoker, PE breath sounds clear to auscultation  Pulmonary exam normal       Cardiovascular hypertension, Pt. on medications and Pt. on home beta blockers DVT ('12) Rhythm:Regular Rate:Normal     Neuro/Psych Lumbar stenosis    GI/Hepatic GERD-  Medicated and Controlled,Elevated LFTs with acute chole N/v with Acute chole   Endo/Other  diabetes, Oral Hypoglycemic AgentsMorbid obesity  Renal/GU      Musculoskeletal   Abdominal (+) + obese,   Peds  Hematology  (+) Blood dyscrasia (Coumadin stopped 6/25, INR 1.26), ,   Anesthesia Other Findings   Reproductive/Obstetrics                           Anesthesia Physical Anesthesia Plan  ASA: III  Anesthesia Plan: General ETT   Post-op Pain Management:    Induction:   Airway Management Planned:   Additional Equipment:   Intra-op Plan:   Post-operative Plan:   Informed Consent:   Plan Discussed with:   Anesthesia Plan Comments:         Anesthesia Quick Evaluation

## 2014-05-25 NOTE — Progress Notes (Signed)
PROGRESS NOTE  Nicolas [name] EHM:[id number] DOB: May 02, 1966 DOA: 05/21/2014 PCP: Ellsworth Lennox, MD  Brief history  48 year old male with a history of diabetes mellitus, recurrent PE (on warfarin), spinal stenosis, and hypertension presented with 2-3 day history of lower abdominal pain with associated nausea and vomiting. At the time of admission, CT of abdomen and pelvis revealed a hydropic gallbladder with wall thickening and inflammatory stranding. RUQ ultrasound confirmed gallbladder wall thickening with gallbladder sludge and debris. The patient was admitted by the surgical team. The patient was started on intravenous Zosyn. Because of the patient's coagulopathy from warfarin, the patient was given vitamin K x2 doses. He was started on a heparin drip prior to surgical intervention could be performed. Laparoscopic cholecystectomy was performed on 05/24/2014 which revealed a gangrenous cholecystitis. Because of concerns of the amount of inflammation and possible CBD leak, GI was consulted for ERCP to be performed on 05/15/14.  Coumadin with heparin bridge will be restarted when cleared by GI. Assessment/Plan:  Gangrenous cholecystitis  -RUQ u/s--gallbladder wall thickening with debris and sludge in gallbladder lumen  -CT abdomen and pelvis negative for bowel obstruction but shows hydropic gallbladder with wall thickening and inflammatory stranding  -Continue Zosyn per surgery  -Lap cholecystectomy 05/24/14  -Because of concerns of the amount of inflammation and possible CBD leak, GI was consulted for ERCP to be performed on 05/15/14.  Hypoxemia  -Likely combination hypoventilation from splinted breathing from pain, fluid overload and OSA  -Given furosemide 40 mg IV x1 on 6/28-->neg 2.1L->improved oxygenation  -Saline lock IV fluids  -continue home dose torsemide 20mg  daily -6/27 CXR--small R-pleural effusion with adjacent atelectasis  -Presently without any distress on 2 L-->99%    -echocardiogram--EF of 36-62%, grade 2 diastolic dysfunction  -checked pro-BNP--463  -pt also has diagnosed sleep apnea-->unable to afford CPAP machine  History of recurrent PE  -Patient given vitamin K x 2  -Started patient on heparin drip pending surgical intervention  -pt has IVC filter  -Restart Coumadin/heparin bridge when cleared by GI  Diabetes mellitus type 2  -05/22/2014 hemoglobin A1c 6.0  -Discontinue metformin during inpatient hospitalization  -NovoLog sliding scale every 4 hours--change to moderate scale due to npo  -CBGs controlled  Hypertension  -Continue metoprolol succinate  Tachycardia  -Likely due to pain and acute medical condition  -improved with treatment  -Continue metoprolol succinate  -EKG--sinus tachycardia without ST -T wave changes  Hx of tobacco use  -quit 13 years ago  Depression  -continue zoloft  Family Communication: No family beside  Disposition Plan: Home with daughter  Antibiotics:  Zosyn 6/26-->     Procedures/Studies: Mr Thoracic Spine W Wo Contrast  05/14/2014   CLINICAL DATA:  History of T6-8 decompression. Paraplegia and incontinence.  EXAM: MRI THORACIC SPINE WITHOUT AND WITH CONTRAST  TECHNIQUE: Multiplanar and multiecho pulse sequences of the thoracic spine were obtained without and with intravenous contrast.  CONTRAST:  52mL MULTIHANCE GADOBENATE DIMEGLUMINE 529 MG/ML IV SOLN  COMPARISON:  MRI thoracic spine 12/03/2013.  FINDINGS: Multilevel Schmorl's nodes are again seen. The patient status post extensive posterior decompression as detailed in a low. Focal edema within the thoracic cord at T8-9 is again identified and does not appear markedly changed. There may also be a focus of cord signal abnormality at the T6-7 level is may be new since the prior exam. Thoracic cord signal is otherwise unremarkable.  Since the prior examination, it appears the patient has undergone revision of  decompression from T6-7 to approximately T7-8 since the  prior examination. There is a fluid collection in the midline of the back tracking along the laminectomy sites. The collection measures 10.3 cm craniocaudal with its largest component in the axial plane at T6-7 where it measures 2.3 by 0.9 cm. There is a smaller collection the deep subcutaneous tissues measures 7.9 cm craniocaudal by up to 1.4 cm AP by 0.4 cm transverse. These collections likely represent seromas.  C7-T1: Shallow central protrusion indents the ventral thecal sac without obvious central canal or foraminal stenosis.  T1-2: No change in a shallow disc bulge without central canal or foraminal narrowing.  T2-3: There is facet arthropathy and ligamentum flavum thickening. The central canal and foramina appear open.  T3-4: Status post posterior decompression. There is facet degenerative disease. The central canal and foramina appear open.  T4-5: Status post posterior decompression. Central canal and foramina appear open. Small T2 hyperintense collection eccentric to the right is seen in the sagittal plane only measuring 0.7 cm craniocaudal. This may be a synovial cyst off the right facet joint. It is unchanged.  T5-6: Status post posterior decompression. Facet arthropathy is present. The central canal and foramina appear open.  T6-7: The patient is status post decompression. Facet arthropathy and ligamentum flavum thickening appear unchanged. Facet arthropathy is noted.  T7-8: Revision of posterior decompression since the prior exam. Central canal stenosis appears improved although ligamentum flavum thickening and facet arthropathy continue to deform the cord. Foramina are open.  T8-9: Status post decompression. The central canal and foramina appear open.  T9-10: Status post posterior decompression. The central canal and foramina appear open.  T10-11: Status post posterior decompression. There is ligamentum flavum thickening and facet arthropathy. The central canal appears open. Foramina appear narrowed.  This level is stable.  T11-12: Ligamentum flavum thickening and facet arthropathy are identified. Central canal stenosis appears unchanged.  T12-L1: This level is imaged in the sagittal plane only and unremarkable.  IMPRESSION: The patient is status post interval revision of posterior decompression at T7-8. The appearance of the T7-8 level is improved although facet arthropathy and ligamentum flavum thickening continue to deform the cord.  Possible subtle focus of edema within the cord at T6-7 mid and present on the prior study but is better seen today.  Fluid collection extending from mid T6 to the T9-10 level likely represents a postoperative seroma as detailed above. A second more superficial collection also likely represents postoperative seroma. CSF leak or abscess are less likely but cannot be excluded.  Except as described above, the patient's examination is unchanged with edema again seen within the cord at T8-9 and multilevel posterior decompression identified.   Electronically Signed   By: Inge Rise M.D.   On: 05/14/2014 16:49   US Abdomen Complete  05/21/2014   CLINICAL DATA:  Nausea, vomiting, leukocytosis. Hydropic gallbladder with thickened wall on CT.  EXAM: ULTRASOUND ABDOMEN COMPLETE  COMPARISON:  CT abdomen and pelvis 05/21/2014. Abdominal ultrasound 10/26/2004.  FINDINGS: Examination was limited by patient body habitus.  Gallbladder:  The gallbladder wall is prominently thickened, measuring 9 mm in thickness. Debris/ sludge was noted in the lumen. No definite shadowing gallstones were identified. Sonographic Murphy's sign was negative.  Common bile duct:  Diameter: 5 mm  Liver:  Diffusely echogenic, compatible with steatosis.  IVC:  Not visualized.  Pancreas:  Not visualized.  Spleen:  Size and appearance within normal limits.  Right Kidney:  Length: 12.2 cm.  Not well visualized.  No gross hydronephrosis.  Left Kidney:  Length: 11.0 cm. Echogenicity within normal limits. No mass or  hydronephrosis visualized.  Abdominal aorta:  Lower abdominal aorta not visualized. No aneurysm identified proximally.  Other findings:  None.  IMPRESSION: 1. Prominent gallbladder thickening with internal debris/sludge. No gallstones were identified and sonographic Murphy's sign was negative, however acute acalculous cholecystitis cannot be excluded in the appropriate clinical setting. 2. Hepatic steatosis.   Electronically Signed   By: Logan Bores   On: 05/21/2014 18:13   Ct Abdomen Pelvis W Contrast  05/21/2014   CLINICAL DATA:  Hyperglycemia. Evaluate for ileus or small-bowel obstruction. Diffuse abdominal pain.  EXAM: CT ABDOMEN AND PELVIS WITH CONTRAST  TECHNIQUE: Multidetector CT imaging of the abdomen and pelvis was performed using the standard protocol following bolus administration of intravenous contrast.  CONTRAST:  110mL OMNIPAQUE IOHEXOL 300 MG/ML  SOLN  COMPARISON:  Abdominal radiographs 05/21/2014 and CT 11/27/2013  FINDINGS: Small, chronic right pleural effusion is unchanged. Mild bibasilar atelectasis/ scarring is again seen.  Diffusely low attenuation of the liver is again noted consistent with steatosis. The gallbladder appears mildly hydropic with evidence of wall thickening and mild fat stranding medial to it. No definite gallstones are identified. No biliary dilatation is seen. 1.5 cm cyst in the lower pole the left kidney is unchanged. The spleen, adrenal glands, right kidney, and pancreas are unremarkable.  Oral contrast is present in nondilated loops of small bowel. The colon is nondilated as well. There is no evidence of bowel obstruction. The bladder is unremarkable. A few small periportal lymph nodes are unchanged from the prior CT. There is no intraperitoneal free fluid or free air.  An infrarenal IVC filter is again seen with associated collapse of the IVC at and below the filter and with 1 of the filter's tines again noted to extend into the posterior aspect of the abdominal  aorta, unchanged. Multiple collateral vessels are again noted in the anterior abdominal wall and visualized lower chest wall. Sequelae of prior laminectomies are again noted in the thoracic and lower lumbar spine.  IMPRESSION: 1. Mildly hydropic gallbladder with new wall thickening and mild surrounding inflammatory stranding. Further evaluation with right upper quadrant ultrasound is suggested to evaluate for acute cholecystitis. 2. No evidence of bowel obstruction. 3. Hepatic steatosis. 4. Small, chronic right pleural effusion.   Electronically Signed   By: Logan Bores   On: 05/21/2014 16:30   Dg Chest Port 1 View  05/22/2014   CLINICAL DATA:  Preop for cholecystectomy. Hypertension. Diabetes. Ex-smoker. Right upper quadrant pain.  EXAM: PORTABLE CHEST - 1 VIEW  COMPARISON:  Acute abdomen series of 05/21/2014 and chest radiograph of 10/21/2008. Abdominal CT 05/21/2014.  FINDINGS: Tracheostomy is appropriately positioned. Patient rotated right. Cardiomegaly accentuated by AP portable technique. Small right pleural effusion is likely similar to yesterday's CT. No pneumothorax. No congestive failure. Patchy right base airspace disease.  IMPRESSION: Small right pleural effusion and adjacent atelectasis versus early infection. Consider short term radiographic followup.  Cardiomegaly and low lung volumes, without congestive failure.   Electronically Signed   By: Abigail Miyamoto M.D.   On: 05/22/2014 16:20   Dg Abd Acute W/chest  05/21/2014   CLINICAL DATA:  Constipation, shortness of breath.  EXAM: ACUTE ABDOMEN SERIES (ABDOMEN 2 VIEW & CHEST 1 VIEW)  COMPARISON:  10/21/2008 chest x-ray  FINDINGS: Lungs are hypoinflated with subtle density of the lateral right base likely scarring/atelectasis versus small amount of pleural fluid. There is stable cardiomegaly.  Fusion hardware is present over the cervical spine unchanged. There is mild degenerative change of the spine.  Abdominal images demonstrate air throughout the  colon. There are several air-filled minimally dilated small bowel loops present. There is no free air. There are mild degenerative changes of the hips. IVC filter is in adequate position.  IMPRESSION: Several air-filled mildly dilated small bowel loops with air seen throughout the colon. Findings may be due to ileus versus early/partial small bowel obstructive process.  Hypoinflation with minimal lateral right basilar density likely scarring/atelectasis versus small amount of right pleural fluid.  Cardiomegaly.   Electronically Signed   By: Marin Olp M.D.   On: 05/21/2014 12:46         Subjective: Patient denies fevers, chills, headache, chest pain, dyspnea, nausea, vomiting, diarrhea, abdominal pain, dysuria, hematuria   Objective: Filed Vitals:   05/24/14 1834 05/24/14 2206 05/25/14 0137 05/25/14 0523  BP: 117/56 106/59 116/53 110/57  Pulse: 84 89 91 80  Temp: 97.5 F (36.4 C) 98 F (36.7 C) 98.4 F (36.9 C) 98.1 F (36.7 C)  TempSrc: Oral Oral Oral Oral  Resp: 20 20 19 18   Height:      Weight:      SpO2: 99% 99% 98% 100%    Intake/Output Summary (Last 24 hours) at 05/25/14 0703 Last data filed at 05/25/14 0702  Gross per 24 hour  Intake 2051.33 ml  Output   1785 ml  Net 266.33 ml   Weight change:  Exam:   General:  Pt is alert, follows commands appropriately, not in acute distress  HEENT: No icterus, No thrush,  [place]/AT  Cardiovascular: RRR, S1/S2, no rubs, no gallops  Respiratory: Fine bibasilar crackles. No wheezing. Good air movement  Abdomen: Soft/+BS, non tender, non distended, no guarding  Extremities: 1+ edema, No lymphangitis, No petechiae, No rashes, no synovitis  Data Reviewed: Basic Metabolic Panel:  Recent Labs Lab 05/21/14 1146 05/22/14 0445 05/23/14 0620 05/24/14 0415  NA 135* 137 132* 137  K 3.5* 3.7 4.0 3.6*  CL 93* 96 92* 95*  CO2 27 26 23 30   GLUCOSE 161* 146* 115* 96  BUN 4* 5* 12 14  CREATININE 0.60 0.58 0.73 0.74  CALCIUM  9.6 9.4 8.8 9.0   Liver Function Tests:  Recent Labs Lab 05/22/14 0445 05/23/14 0620 05/24/14 0415  AST 126* 83* 71*  ALT 115* 110* 102*  ALKPHOS 115 113 114  BILITOT 4.1* 3.7* 2.5*  PROT 8.1 7.1 7.0  ALBUMIN 3.3* 2.6* 2.3*    Recent Labs Lab 05/21/14 1146 05/22/14 0445  LIPASE 19 13   No results found for this basename: AMMONIA,  in the last 168 hours CBC:  Recent Labs Lab 05/21/14 1146 05/22/14 0445 05/23/14 0620 05/24/14 0415  WBC 15.5* 21.2* 18.3* 12.3*  NEUTROABS 12.8*  --   --   --   HGB 15.1 13.9 13.1 12.1*  HCT 47.3 42.8 40.7 38.4*  MCV 84.9 82.8 83.7 85.5  PLT 238 216 197 196   Cardiac Enzymes: No results found for this basename: CKTOTAL, CKMB, CKMBINDEX, TROPONINI,  in the last 168 hours BNP: No components found with this basename: POCBNP,  CBG:  Recent Labs Lab 05/24/14 1159 05/24/14 1727 05/24/14 1949 05/25/14 0007 05/25/14 0358  GLUCAP 123* 121* 126* 109* 94    Recent Results (from the past 240 hour(s))  SURGICAL PCR SCREEN     Status: None   Collection Time    05/22/14 10:20 AM  Result Value Ref Range Status   MRSA, PCR NEGATIVE  NEGATIVE Final   Staphylococcus aureus NEGATIVE  NEGATIVE Final   Comment:            The Xpert SA Assay (FDA     approved for NASAL specimens     in patients over 63 years of age),     is one component of     a comprehensive surveillance     program.  Test performance has     been validated by [employer] for patients greater     than or equal to 36 year old.     It is not intended     to diagnose infection nor to     guide or monitor treatment.     Scheduled Meds: . insulin aspart  0-15 Units Subcutaneous TID WC  . metoprolol succinate  50 mg Oral Daily  . pantoprazole (PROTONIX) IV  40 mg Intravenous QHS  . piperacillin-tazobactam (ZOSYN)  IV  3.375 g Intravenous 3 times per day  . sertraline  100 mg Oral Daily  . torsemide  20 mg Oral Daily  . traZODone  100 mg Oral QHS    Continuous Infusions: . sodium chloride 20 mL/hr at 05/24/14 1411     Pat Elicker, DO  [facility] Pager [phone]  If 7PM-7AM, please contact night-coverage [email or web address] Password [email or web address] 05/25/2014, 7:03 AM   LOS: 4 days

## 2014-05-26 ENCOUNTER — Encounter (HOSPITAL_COMMUNITY): Payer: Self-pay | Admitting: Gastroenterology

## 2014-05-26 LAB — COMPREHENSIVE METABOLIC PANEL
ALT: 69 U/L — ABNORMAL HIGH (ref 0–53)
AST: 39 U/L — ABNORMAL HIGH (ref 0–37)
Albumin: 2.1 g/dL — ABNORMAL LOW (ref 3.5–5.2)
Alkaline Phosphatase: 98 U/L (ref 39–117)
BUN: 7 mg/dL (ref 6–23)
CO2: 32 mEq/L (ref 19–32)
Calcium: 8.4 mg/dL (ref 8.4–10.5)
Chloride: 98 mEq/L (ref 96–112)
Creatinine, Ser: 0.63 mg/dL (ref 0.50–1.35)
GFR calc Af Amer: >90 mL/min (ref >90)
GFR calc non Af Amer: >90 mL/min (ref >90)
Glucose, Bld: 84 mg/dL (ref 70–99)
Potassium: 3.7 mEq/L (ref 3.7–5.3)
Sodium: 140 mEq/L (ref 137–147)
Total Bilirubin: 0.8 mg/dL (ref 0.3–1.2)
Total Protein: 6.8 g/dL (ref 6.0–8.3)

## 2014-05-26 LAB — CBC
HCT: 36.9 % — ABNORMAL LOW (ref 39.0–52.0)
Hemoglobin: 11.3 g/dL — ABNORMAL LOW (ref 13.0–17.0)
MCH: 26.7 pg (ref 26.0–34.0)
MCHC: 30.6 g/dL (ref 30.0–36.0)
MCV: 87 fL (ref 78.0–100.0)
Platelets: 202 10*3/uL (ref 150–400)
RBC: 4.24 MIL/uL (ref 4.22–5.81)
RDW: 17.3 % — ABNORMAL HIGH (ref 11.5–15.5)
WBC: 7.5 10*3/uL (ref 4.0–10.5)

## 2014-05-26 LAB — HEPARIN LEVEL (UNFRACTIONATED)
Heparin Unfractionated: 0.35 IU/mL (ref 0.30–0.70)
Heparin Unfractionated: 0.46 IU/mL (ref 0.30–0.70)

## 2014-05-26 LAB — GLUCOSE, CAPILLARY
Glucose-Capillary: 114 mg/dL — ABNORMAL HIGH (ref 70–99)
Glucose-Capillary: 105 mg/dL — ABNORMAL HIGH (ref 70–99)
Glucose-Capillary: 109 mg/dL — ABNORMAL HIGH (ref 70–99)
Glucose-Capillary: 132 mg/dL — ABNORMAL HIGH (ref 70–99)

## 2014-05-26 MED ORDER — HEPARIN (PORCINE) IN NACL 100-0.45 UNIT/ML-% IJ SOLN
2600.0000 [IU]/h | INTRAMUSCULAR | Status: DC
  Administered 2014-05-26 – 2014-05-28 (×6): 2600 [IU]/h via INTRAVENOUS
  Filled 2014-05-26 (×7): qty 250

## 2014-05-26 MED ORDER — METOPROLOL SUCCINATE ER 25 MG PO TB24
25.0000 mg | ORAL_TABLET | Freq: Every day | ORAL | Status: DC
  Administered 2014-05-27 – 2014-06-01 (×6): 25 mg via ORAL
  Filled 2014-05-26 (×6): qty 1

## 2014-05-26 MED ORDER — WARFARIN - PHARMACIST DOSING INPATIENT
Freq: Every day | Status: DC
Start: 2014-05-26 — End: 2014-06-01
  Administered 2014-05-29 – 2014-05-31 (×2)

## 2014-05-26 MED ORDER — WARFARIN SODIUM 10 MG PO TABS
12.5000 mg | ORAL_TABLET | Freq: Once | ORAL | Status: AC
  Administered 2014-05-26: 12.5 mg via ORAL
  Filled 2014-05-26: qty 1

## 2014-05-26 NOTE — Progress Notes (Signed)
PROGRESS NOTE  Nicolas [name] PYK:[id number] DOB: [birth year] DOA: 05/21/2014 PCP: Ellsworth Lennox, MD  Brief history  48 year old male with a history of diabetes mellitus, recurrent PE (on warfarin), spinal stenosis, and hypertension presented with 2-3 day history of lower abdominal pain with associated nausea and vomiting. At the time of admission, CT of abdomen and pelvis revealed a hydropic gallbladder with wall thickening and inflammatory stranding. RUQ ultrasound confirmed gallbladder wall thickening with gallbladder sludge and debris. The patient was admitted by the surgical team. The patient was started on intravenous Zosyn. Because of the patient's coagulopathy from warfarin, the patient was given vitamin K x2 doses. He was started on a heparin drip prior to surgical intervention could be performed. Laparoscopic cholecystectomy was performed on 05/24/2014 which revealed a gangrenous cholecystitis. Because of concerns of the amount of inflammation and possible CBD leak, GI was consulted for ERCP to be performed on 05/15/14.  Coumadin with heparin bridge will be restarted when cleared by GI.   Assessment/Plan:   Gangrenous cholecystitis  -RUQ u/s--gallbladder wall thickening with debris and sludge in gallbladder lumen  -CT abdomen and pelvis negative for bowel obstruction but shows hydropic gallbladder with wall thickening and inflammatory stranding  -Continue Zosyn per surgery  -Lap cholecystectomy 05/24/14  -Because of concerns of the amount of inflammation and possible CBD leak, GI was consulted for ERCP to be performed on 05/25/14.  -Will defer management of this problem to primary team which is general surgery along with GI.     Hypoxemia due to acute on chronic diastolic CHF. EF 55-60% along with splinted breathing post op -Responded well to Lasix, now not shortness of breath and feels close to baseline. Monitor fluid administration closely -pt also has diagnosed sleep  apnea-->unable to afford CPAP machine      History of recurrent PE  -Has IVC filter, case discussed with GI physician Dr. Benson Norway. Resume heparin without bolus on 05/26/2014 along with Coumadin overlap to be dosed by pharmacy.     Diabetes mellitus type 2  -05/22/2014 hemoglobin A1c 6.0  -Discontinue metformin during inpatient hospitalization  -NovoLog sliding scale every 4 hours--change to moderate scale due to npo    CBG (last 3)   Recent Labs  05/25/14 2102 05/26/14 0800 05/26/14 1130  GLUCAP 118* 114* 105*      Lab Results  Component Value Date   HGBA1C 6.0* 05/22/2014      Hypertension  -Continue metoprolol succinate     Tachycardia  -Likely due to pain and acute medical condition , no chest pain or shortness of breath. -Now resolved,Continue metoprolol succinate  -EKG--sinus tachycardia without ST -T wave changes     Hx of tobacco use  -quit 13 years ago     Depression  -continue zoloft     Family Communication: No family beside  Disposition Plan: Home with daughter    Antibiotics:  Zosyn 6/26-->      Procedures/Studies: Mr Thoracic Spine W Wo Contrast  05/14/2014   CLINICAL DATA:  History of T6-8 decompression. Paraplegia and incontinence.  EXAM: MRI THORACIC SPINE WITHOUT AND WITH CONTRAST  TECHNIQUE: Multiplanar and multiecho pulse sequences of the thoracic spine were obtained without and with intravenous contrast.  CONTRAST:  21mL MULTIHANCE GADOBENATE DIMEGLUMINE 529 MG/ML IV SOLN  COMPARISON:  MRI thoracic spine 12/03/2013.  FINDINGS: Multilevel Schmorl's nodes are again seen. The patient status post extensive posterior decompression as detailed in a low. Focal edema within the  thoracic cord at T8-9 is again identified and does not appear markedly changed. There may also be a focus of cord signal abnormality at the T6-7 level is may be new since the prior exam. Thoracic cord signal is otherwise unremarkable.  Since the prior examination,  it appears the patient has undergone revision of decompression from T6-7 to approximately T7-8 since the prior examination. There is a fluid collection in the midline of the back tracking along the laminectomy sites. The collection measures 10.3 cm craniocaudal with its largest component in the axial plane at T6-7 where it measures 2.3 by 0.9 cm. There is a smaller collection the deep subcutaneous tissues measures 7.9 cm craniocaudal by up to 1.4 cm AP by 0.4 cm transverse. These collections likely represent seromas.  C7-T1: Shallow central protrusion indents the ventral thecal sac without obvious central canal or foraminal stenosis.  T1-2: No change in a shallow disc bulge without central canal or foraminal narrowing.  T2-3: There is facet arthropathy and ligamentum flavum thickening. The central canal and foramina appear open.  T3-4: Status post posterior decompression. There is facet degenerative disease. The central canal and foramina appear open.  T4-5: Status post posterior decompression. Central canal and foramina appear open. Small T2 hyperintense collection eccentric to the right is seen in the sagittal plane only measuring 0.7 cm craniocaudal. This may be a synovial cyst off the right facet joint. It is unchanged.  T5-6: Status post posterior decompression. Facet arthropathy is present. The central canal and foramina appear open.  T6-7: The patient is status post decompression. Facet arthropathy and ligamentum flavum thickening appear unchanged. Facet arthropathy is noted.  T7-8: Revision of posterior decompression since the prior exam. Central canal stenosis appears improved although ligamentum flavum thickening and facet arthropathy continue to deform the cord. Foramina are open.  T8-9: Status post decompression. The central canal and foramina appear open.  T9-10: Status post posterior decompression. The central canal and foramina appear open.  T10-11: Status post posterior decompression. There is  ligamentum flavum thickening and facet arthropathy. The central canal appears open. Foramina appear narrowed. This level is stable.  T11-12: Ligamentum flavum thickening and facet arthropathy are identified. Central canal stenosis appears unchanged.  T12-L1: This level is imaged in the sagittal plane only and unremarkable.  IMPRESSION: The patient is status post interval revision of posterior decompression at T7-8. The appearance of the T7-8 level is improved although facet arthropathy and ligamentum flavum thickening continue to deform the cord.  Possible subtle focus of edema within the cord at T6-7 mid and present on the prior study but is better seen today.  Fluid collection extending from mid T6 to the T9-10 level likely represents a postoperative seroma as detailed above. A second more superficial collection also likely represents postoperative seroma. CSF leak or abscess are less likely but cannot be excluded.  Except as described above, the patient's examination is unchanged with edema again seen within the cord at T8-9 and multilevel posterior decompression identified.   Electronically Signed   By: Inge Rise M.D.   On: 05/14/2014 16:49   US Abdomen Complete  05/21/2014   CLINICAL DATA:  Nausea, vomiting, leukocytosis. Hydropic gallbladder with thickened wall on CT.  EXAM: ULTRASOUND ABDOMEN COMPLETE  COMPARISON:  CT abdomen and pelvis 05/21/2014. Abdominal ultrasound 10/26/2004.  FINDINGS: Examination was limited by patient body habitus.  Gallbladder:  The gallbladder wall is prominently thickened, measuring 9 mm in thickness. Debris/ sludge was noted in the lumen. No definite shadowing gallstones  were identified. Sonographic Murphy's sign was negative.  Common bile duct:  Diameter: 5 mm  Liver:  Diffusely echogenic, compatible with steatosis.  IVC:  Not visualized.  Pancreas:  Not visualized.  Spleen:  Size and appearance within normal limits.  Right Kidney:  Length: 12.2 cm.  Not well  visualized.  No gross hydronephrosis.  Left Kidney:  Length: 11.0 cm. Echogenicity within normal limits. No mass or hydronephrosis visualized.  Abdominal aorta:  Lower abdominal aorta not visualized. No aneurysm identified proximally.  Other findings:  None.  IMPRESSION: 1. Prominent gallbladder thickening with internal debris/sludge. No gallstones were identified and sonographic Murphy's sign was negative, however acute acalculous cholecystitis cannot be excluded in the appropriate clinical setting. 2. Hepatic steatosis.   Electronically Signed   By: Logan Bores   On: 05/21/2014 18:13   Ct Abdomen Pelvis W Contrast  05/21/2014   CLINICAL DATA:  Hyperglycemia. Evaluate for ileus or small-bowel obstruction. Diffuse abdominal pain.  EXAM: CT ABDOMEN AND PELVIS WITH CONTRAST  TECHNIQUE: Multidetector CT imaging of the abdomen and pelvis was performed using the standard protocol following bolus administration of intravenous contrast.  CONTRAST:  14mL OMNIPAQUE IOHEXOL 300 MG/ML  SOLN  COMPARISON:  Abdominal radiographs 05/21/2014 and CT 11/27/2013  FINDINGS: Small, chronic right pleural effusion is unchanged. Mild bibasilar atelectasis/ scarring is again seen.  Diffusely low attenuation of the liver is again noted consistent with steatosis. The gallbladder appears mildly hydropic with evidence of wall thickening and mild fat stranding medial to it. No definite gallstones are identified. No biliary dilatation is seen. 1.5 cm cyst in the lower pole the left kidney is unchanged. The spleen, adrenal glands, right kidney, and pancreas are unremarkable.  Oral contrast is present in nondilated loops of small bowel. The colon is nondilated as well. There is no evidence of bowel obstruction. The bladder is unremarkable. A few small periportal lymph nodes are unchanged from the prior CT. There is no intraperitoneal free fluid or free air.  An infrarenal IVC filter is again seen with associated collapse of the IVC at and  below the filter and with 1 of the filter's tines again noted to extend into the posterior aspect of the abdominal aorta, unchanged. Multiple collateral vessels are again noted in the anterior abdominal wall and visualized lower chest wall. Sequelae of prior laminectomies are again noted in the thoracic and lower lumbar spine.  IMPRESSION: 1. Mildly hydropic gallbladder with new wall thickening and mild surrounding inflammatory stranding. Further evaluation with right upper quadrant ultrasound is suggested to evaluate for acute cholecystitis. 2. No evidence of bowel obstruction. 3. Hepatic steatosis. 4. Small, chronic right pleural effusion.   Electronically Signed   By: Logan Bores   On: 05/21/2014 16:30   Dg Chest Port 1 View  05/22/2014   CLINICAL DATA:  Preop for cholecystectomy. Hypertension. Diabetes. Ex-smoker. Right upper quadrant pain.  EXAM: PORTABLE CHEST - 1 VIEW  COMPARISON:  Acute abdomen series of 05/21/2014 and chest radiograph of 10/21/2008. Abdominal CT 05/21/2014.  FINDINGS: Tracheostomy is appropriately positioned. Patient rotated right. Cardiomegaly accentuated by AP portable technique. Small right pleural effusion is likely similar to yesterday's CT. No pneumothorax. No congestive failure. Patchy right base airspace disease.  IMPRESSION: Small right pleural effusion and adjacent atelectasis versus early infection. Consider short term radiographic followup.  Cardiomegaly and low lung volumes, without congestive failure.   Electronically Signed   By: Abigail Miyamoto M.D.   On: 05/22/2014 16:20   Dg Abd Acute W/chest  05/21/2014   CLINICAL DATA:  Constipation, shortness of breath.  EXAM: ACUTE ABDOMEN SERIES (ABDOMEN 2 VIEW & CHEST 1 VIEW)  COMPARISON:  10/21/2008 chest x-ray  FINDINGS: Lungs are hypoinflated with subtle density of the lateral right base likely scarring/atelectasis versus small amount of pleural fluid. There is stable cardiomegaly. Fusion hardware is present over the cervical  spine unchanged. There is mild degenerative change of the spine.  Abdominal images demonstrate air throughout the colon. There are several air-filled minimally dilated small bowel loops present. There is no free air. There are mild degenerative changes of the hips. IVC filter is in adequate position.  IMPRESSION: Several air-filled mildly dilated small bowel loops with air seen throughout the colon. Findings may be due to ileus versus early/partial small bowel obstructive process.  Hypoinflation with minimal lateral right basilar density likely scarring/atelectasis versus small amount of right pleural fluid.  Cardiomegaly.   Electronically Signed   By: Marin Olp M.D.   On: 05/21/2014 12:46         Subjective:  Patient denies fevers, chills, headache, chest pain, dyspnea, nausea, vomiting, diarrhea, abdominal pain, dysuria, hematuria   Objective: Filed Vitals:   05/25/14 2107 05/26/14 0152 05/26/14 0549 05/26/14 1052  BP: 129/68 130/78 118/66 102/41  Pulse: 90 89 80 79  Temp: 98 F (36.7 C) 97.8 F (36.6 C) 98 F (36.7 C) 98.3 F (36.8 C)  TempSrc: Oral Oral Oral Oral  Resp: 18 20 20 17   Height:      Weight:      SpO2: 97% 97% 97% 94%    Intake/Output Summary (Last 24 hours) at 05/26/14 1140 Last data filed at 05/26/14 0900  Gross per 24 hour  Intake   2020 ml  Output   1881 ml  Net    139 ml   Weight change:  Exam:   General:  Pt is alert, follows commands appropriately, not in acute distress  HEENT: No icterus, No thrush,  [place]/AT  Cardiovascular: RRR, S1/S2, no rubs, no gallops  Respiratory: Fine bibasilar crackles. No wheezing. Good air movement  Abdomen: Soft/+BS, non tender, non distended, no guarding  Extremities: 1+ edema, No lymphangitis, No petechiae, No rashes, no synovitis  Data Reviewed: Basic Metabolic Panel:  Recent Labs Lab 05/22/14 0445 05/23/14 0620 05/24/14 0415 05/25/14 0750 05/26/14 0529  NA 137 132* 137 142 140  K 3.7 4.0 3.6* 3.7  3.7  CL 96 92* 95* 100 98  CO2 26 23 30  33* 32  GLUCOSE 146* 115* 96 97 84  BUN 5* 12 14 10 7   CREATININE 0.58 0.73 0.74 0.59 0.63  CALCIUM 9.4 8.8 9.0 8.8 8.4   Liver Function Tests:  Recent Labs Lab 05/22/14 0445 05/23/14 0620 05/24/14 0415 05/25/14 0750 05/26/14 0529  AST 126* 83* 71* 69* 39*  ALT 115* 110* 102* 95* 69*  ALKPHOS 115 113 114 113 98  BILITOT 4.1* 3.7* 2.5* 1.1 0.8  PROT 8.1 7.1 7.0 7.0 6.8  ALBUMIN 3.3* 2.6* 2.3* 2.3* 2.1*    Recent Labs Lab 05/21/14 1146 05/22/14 0445  LIPASE 19 13   No results found for this basename: AMMONIA,  in the last 168 hours CBC:  Recent Labs Lab 05/21/14 1146 05/22/14 0445 05/23/14 0620 05/24/14 0415 05/25/14 0750 05/26/14 0529  WBC 15.5* 21.2* 18.3* 12.3* 9.1 7.5  NEUTROABS 12.8*  --   --   --   --   --   HGB 15.1 13.9 13.1 12.1* 11.7* 11.3*  HCT 47.3 42.8  40.7 38.4* 38.5* 36.9*  MCV 84.9 82.8 83.7 85.5 88.7 87.0  PLT 238 216 197 196 214 202   Cardiac Enzymes: No results found for this basename: CKTOTAL, CKMB, CKMBINDEX, TROPONINI,  in the last 168 hours BNP: No components found with this basename: POCBNP,  CBG:  Recent Labs Lab 05/25/14 1535 05/25/14 1659 05/25/14 2102 05/26/14 0800 05/26/14 1130  GLUCAP 103* 97 118* 114* 105*    Recent Results (from the past 240 hour(s))  SURGICAL PCR SCREEN     Status: None   Collection Time    05/22/14 10:20 AM      Result Value Ref Range Status   MRSA, PCR NEGATIVE  NEGATIVE Final   Staphylococcus aureus NEGATIVE  NEGATIVE Final   Comment:            The Xpert SA Assay (FDA     approved for NASAL specimens     in patients over 89 years of age),     is one component of     a comprehensive surveillance     program.  Test performance has     been validated by [employer] for patients greater     than or equal to 24 year old.     It is not intended     to diagnose infection nor to     guide or monitor treatment.     Scheduled Meds: . insulin  aspart  0-15 Units Subcutaneous TID WC  . metoprolol succinate  50 mg Oral Daily  . pantoprazole (PROTONIX) IV  40 mg Intravenous QHS  . piperacillin-tazobactam (ZOSYN)  IV  3.375 g Intravenous 3 times per day  . sertraline  100 mg Oral Daily  . torsemide  20 mg Oral Daily  . traZODone  100 mg Oral QHS  . warfarin  12.5 mg Oral ONCE-1800  . Warfarin - Pharmacist Dosing Inpatient   Does not apply q1800   Continuous Infusions: . heparin 2,600 Units/hr (05/26/14 0942)  . lactated ringers 10 mL/hr at 05/25/14 2218     Thurnell Lose, DO  [facility] Pager [phone]  If 7PM-7AM, please contact night-coverage [email or web address] Password [email or web address] 05/26/2014, 11:40 AM   LOS: 5 days

## 2014-05-26 NOTE — Progress Notes (Signed)
ANTICOAGULATION CONSULT NOTE - Initial Consult  Pharmacy Consult for heparin + warfarin Indication: DVT  Allergies  Allergen Reactions  . Lisinopril Other (See Comments)    cough  . Ace Inhibitors     cough    Patient Measurements: Height: 5\' 11"  (180.3 cm) Weight: 315 lb 4.1 oz (143 kg) IBW/kg (Calculated) : 75.3 Heparin Dosing Weight: 109kg  Vital Signs: Temp: 98.1 F (36.7 C) (07/01 1354) Temp src: Oral (07/01 1354) BP: 139/76 mmHg (07/01 1354) Pulse Rate: 87 (07/01 1354)  Labs:  Recent Labs  05/23/14 2010  05/24/14 0415 05/25/14 0750 05/26/14 0529 05/26/14 1550  HGB  --   < > 12.1* 11.7* 11.3*  --   HCT  --   --  38.4* 38.5* 36.9*  --   PLT  --   --  196 214 202  --   LABPROT  --   --   --  14.5  --   --   INR  --   --   --  1.13  --   --   HEPARINUNFRC <0.10*  --  <0.10*  --   --  0.35  CREATININE  --   --  0.74 0.59 0.63  --   < > = values in this interval not displayed.  Estimated Creatinine Clearance: 165.3 ml/min (by C-G formula based on Cr of 0.63).  Assessment: 38 YOM on warfarin PTA (home dose 10mg  daily except 5mg  on MWF) s/p INR reversal with 2 doses of vitamin K 10mg  IV as well as lap chole and ERCP. Now to resume heparin without bolus as bridge to therapeutic warfarin therapy. First level came back therapeutic.   Goal of Therapy:  INR 2-3 Heparin level 0.3-0.7 units/ml Monitor platelets by anticoagulation protocol: Yes   Plan:   Cont heparin drip at 2600 units/hr Repeat level to confirm

## 2014-05-26 NOTE — Progress Notes (Signed)
ANTICOAGULATION CONSULT NOTE - Initial Consult  Pharmacy Consult for heparin + warfarin Indication: DVT  Allergies  Allergen Reactions  . Lisinopril Other (See Comments)    cough  . Ace Inhibitors     cough    Patient Measurements: Height: 5\' 11"  (180.3 cm) Weight: 315 lb 4.1 oz (143 kg) IBW/kg (Calculated) : 75.3 Heparin Dosing Weight: 109kg  Vital Signs: Temp: 98 F (36.7 C) (07/01 0549) Temp src: Oral (07/01 0549) BP: 118/66 mmHg (07/01 0549) Pulse Rate: 80 (07/01 0549)  Labs:  Recent Labs  05/23/14 2010  05/24/14 0415 05/25/14 0750 05/26/14 0529  HGB  --   < > 12.1* 11.7* 11.3*  HCT  --   --  38.4* 38.5* 36.9*  PLT  --   --  196 214 202  LABPROT  --   --   --  14.5  --   INR  --   --   --  1.13  --   HEPARINUNFRC <0.10*  --  <0.10*  --   --   CREATININE  --   --  0.74 0.59 0.63  < > = values in this interval not displayed.  Estimated Creatinine Clearance: 165.3 ml/min (by C-G formula based on Cr of 0.63).  Assessment: 26 YOM on warfarin PTA (home dose 10mg  daily except 5mg  on MWF) s/p INR reversal with 2 doses of vitamin K 10mg  IV as well as lap chole and ERCP. Now to resume heparin without bolus as bridge to therapeutic warfarin therapy. INR yesterday morning 1.13. CBC remain stable post-op. No bleeding noted. Patient on heparin previously- highest rate was 2500 units/hr on 6/28. He did not reach a therapeutic level.  Goal of Therapy:  INR 2-3 Heparin level 0.3-0.7 units/ml Monitor platelets by anticoagulation protocol: Yes   Plan:  1. Start heparin drip at 2600 units/hr 2. Check heparin level in 6 hours 3. Warfarin 12.5mg  po x1 tonight 4. Daily HL, CBC and INR 5. Follow for s/s bleeding  Seferino Oscar D. Brennen Gardiner, PharmD, BCPS Clinical Pharmacist Pager: [phone] 05/26/2014 9:09 AM

## 2014-05-26 NOTE — Progress Notes (Addendum)
1 Day Post-Op  Subjective: Mild soreness, tolerating clears  Objective: Vital signs in last 24 hours: Temp:  [97.8 F (36.6 C)-98.4 F (36.9 C)] 98 F (36.7 C) (07/01 0549) Pulse Rate:  [80-98] 80 (07/01 0549) Resp:  [12-26] 20 (07/01 0549) BP: (105-171)/(50-87) 118/66 mmHg (07/01 0549) SpO2:  [97 %-100 %] 97 % (07/01 0549) Last BM Date: 05/20/14  Intake/Output from previous day: 06/30 0701 - 07/01 0700 In: 2020 [P.O.:1320; I.V.:700] Out: 2256 [Urine:2125; Drains:81; Blood:50] Intake/Output this shift:    General appearance: alert and cooperative Resp: clear to auscultation bilaterally Cardio: regular rate and rhythm GI: soft, NT, incisions CDI, JP bile stained serosanguinous  Lab Results:   Recent Labs  05/25/14 0750 05/26/14 0529  WBC 9.1 7.5  HGB 11.7* 11.3*  HCT 38.5* 36.9*  PLT 214 202   BMET  Recent Labs  05/25/14 0750 05/26/14 0529  NA 142 140  K 3.7 3.7  CL 100 98  CO2 33* 32  GLUCOSE 97 84  BUN 10 7  CREATININE 0.59 0.63  CALCIUM 8.8 8.4   PT/INR  Recent Labs  05/25/14 0750  LABPROT 14.5  INR 1.13   ABG No results found for this basename: PHART, PCO2, PO2, HCO3,  in the last 72 hours  Studies/Results: Dg Ercp  05/25/2014   CLINICAL DATA:  Bile leak  EXAM: ERCP  TECHNIQUE: Multiple spot images obtained with the fluoroscopic device and submitted for interpretation post-procedure.  : FINDINGS:  Three spot films were obtained intraoperatively. The initial films show injection into the biliary tree. A surgical drain is seen in place. Some contrast is noted near the cystic duct remnant which may represent a leak. Correlation with the findings and ERCP is recommended. A biliary stent was then placed. 1 min and 50 seconds of fluoroscopy was utilized.  These images were submitted for radiologic interpretation only. Please see the procedural report for the amount of contrast and the fluoroscopy time utilized.   Electronically Signed   By: Inez Catalina  M.D.   On: 05/25/2014 15:37    Anti-infectives: Anti-infectives   Start     Dose/Rate Route Frequency Ordered Stop   05/21/14 2200  piperacillin-tazobactam (ZOSYN) IVPB 3.375 g     3.375 g 12.5 mL/hr over 240 Minutes Intravenous 3 times per day 05/21/14 2054        Assessment/Plan: POD#2 Bile leak - S/P ERCP and stent yesterday, appreciate GI assistance, no significant leak visualized, continue JP DM/HTN/Hx PE - appreciate hospitalist service management, resuming coumadin ID - Zosyn, D/C after today FEN - advance to heart healthy/carb mod diet  LOS: 5 days    Nicolas [name] 05/26/2014

## 2014-05-26 NOTE — Progress Notes (Signed)
Subjective: No complaints.  No complications from the ERCP.  Objective: Vital signs in last 24 hours: Temp:  [97.8 F (36.6 C)-98.4 F (36.9 C)] 98 F (36.7 C) (07/01 0549) Pulse Rate:  [80-98] 80 (07/01 0549) Resp:  [12-26] 20 (07/01 0549) BP: (105-171)/(50-87) 118/66 mmHg (07/01 0549) SpO2:  [97 %-100 %] 97 % (07/01 0549) Last BM Date: 05/20/14  Intake/Output from previous day: 06/30 0701 - 07/01 0700 In: 2020 [P.O.:1320; I.V.:700] Out: 2256 [Urine:2125; Drains:81; Blood:50] Intake/Output this shift:    General appearance: alert and no distress GI: tender at the incision sites.  Lab Results:  Recent Labs  05/24/14 0415 05/25/14 0750 05/26/14 0529  WBC 12.3* 9.1 7.5  HGB 12.1* 11.7* 11.3*  HCT 38.4* 38.5* 36.9*  PLT 196 214 202   BMET  Recent Labs  05/24/14 0415 05/25/14 0750 05/26/14 0529  NA 137 142 140  K 3.6* 3.7 3.7  CL 95* 100 98  CO2 30 33* 32  GLUCOSE 96 97 84  BUN 14 10 7   CREATININE 0.74 0.59 0.63  CALCIUM 9.0 8.8 8.4   LFT  Recent Labs  05/26/14 0529  PROT 6.8  ALBUMIN 2.1*  AST 39*  ALT 69*  ALKPHOS 98  BILITOT 0.8   PT/INR  Recent Labs  05/25/14 0750  LABPROT 14.5  INR 1.13   Hepatitis Panel No results found for this basename: HEPBSAG, HCVAB, HEPAIGM, HEPBIGM,  in the last 72 hours C-Diff No results found for this basename: CDIFFTOX,  in the last 72 hours Fecal Lactopherrin No results found for this basename: FECLLACTOFRN,  in the last 72 hours  Studies/Results: Dg Ercp  05/25/2014   CLINICAL DATA:  Bile leak  EXAM: ERCP  TECHNIQUE: Multiple spot images obtained with the fluoroscopic device and submitted for interpretation post-procedure.  : FINDINGS:  Three spot films were obtained intraoperatively. The initial films show injection into the biliary tree. A surgical drain is seen in place. Some contrast is noted near the cystic duct remnant which may represent a leak. Correlation with the findings and ERCP is recommended.  A biliary stent was then placed. 1 min and 50 seconds of fluoroscopy was utilized.  These images were submitted for radiologic interpretation only. Please see the procedural report for the amount of contrast and the fluoroscopy time utilized.   Electronically Signed   By: Inez Catalina M.D.   On: 05/25/2014 15:37    Medications:  Scheduled: . insulin aspart  0-15 Units Subcutaneous TID WC  . metoprolol succinate  50 mg Oral Daily  . pantoprazole (PROTONIX) IV  40 mg Intravenous QHS  . piperacillin-tazobactam (ZOSYN)  IV  3.375 g Intravenous 3 times per day  . sertraline  100 mg Oral Daily  . torsemide  20 mg Oral Daily  . traZODone  100 mg Oral QHS   Continuous: . lactated ringers 10 mL/hr at 05/25/14 2218    Assessment/Plan: 1) S/p Cystic duct leak. 2) S/p acute cholecystitis.   The patient is well.  No post-ERCP complications.  Again, no overt source of leakage during the cholangiogram, but a stent was placed for good measure.  Plan: 1) Routine Post-Op care. 2) Stent removal in one month.  I will make the arrangements.   LOS: 5 days   Shawon Denzer D 05/26/2014, 8:10 AM

## 2014-05-27 LAB — BASIC METABOLIC PANEL
Anion gap: 8 (ref 5–15)
BUN: 5 mg/dL — ABNORMAL LOW (ref 6–23)
CO2: 35 mEq/L — ABNORMAL HIGH (ref 19–32)
Calcium: 8.9 mg/dL (ref 8.4–10.5)
Chloride: 98 mEq/L (ref 96–112)
Creatinine, Ser: 0.71 mg/dL (ref 0.50–1.35)
GFR calc Af Amer: >90 mL/min (ref >90)
GFR calc non Af Amer: >90 mL/min (ref >90)
Glucose, Bld: 127 mg/dL — ABNORMAL HIGH (ref 70–99)
Potassium: 3.2 mEq/L — ABNORMAL LOW (ref 3.7–5.3)
Sodium: 141 mEq/L (ref 137–147)

## 2014-05-27 LAB — CBC
HCT: 36 % — ABNORMAL LOW (ref 39.0–52.0)
Hemoglobin: 11.1 g/dL — ABNORMAL LOW (ref 13.0–17.0)
MCH: 26.7 pg (ref 26.0–34.0)
MCHC: 30.8 g/dL (ref 30.0–36.0)
MCV: 86.7 fL (ref 78.0–100.0)
Platelets: 216 10*3/uL (ref 150–400)
RBC: 4.15 MIL/uL — ABNORMAL LOW (ref 4.22–5.81)
RDW: 17 % — ABNORMAL HIGH (ref 11.5–15.5)
WBC: 7.7 10*3/uL (ref 4.0–10.5)

## 2014-05-27 LAB — GLUCOSE, CAPILLARY
Glucose-Capillary: 130 mg/dL — ABNORMAL HIGH (ref 70–99)
Glucose-Capillary: 125 mg/dL — ABNORMAL HIGH (ref 70–99)
Glucose-Capillary: 105 mg/dL — ABNORMAL HIGH (ref 70–99)
Glucose-Capillary: 134 mg/dL — ABNORMAL HIGH (ref 70–99)

## 2014-05-27 LAB — PROTIME-INR
INR: 1.06 (ref 0.00–1.49)
Prothrombin Time: 13.8 seconds (ref 11.6–15.2)

## 2014-05-27 LAB — HEPARIN LEVEL (UNFRACTIONATED): Heparin Unfractionated: 0.58 IU/mL (ref 0.30–0.70)

## 2014-05-27 MED ORDER — WARFARIN SODIUM 2.5 MG PO TABS
12.5000 mg | ORAL_TABLET | Freq: Once | ORAL | Status: AC
  Administered 2014-05-27: 12.5 mg via ORAL
  Filled 2014-05-27: qty 1

## 2014-05-27 MED ORDER — POTASSIUM CHLORIDE CRYS ER 20 MEQ PO TBCR
40.0000 meq | EXTENDED_RELEASE_TABLET | ORAL | Status: AC
  Administered 2014-05-27 (×2): 40 meq via ORAL
  Filled 2014-05-27 (×2): qty 2

## 2014-05-27 MED ORDER — AMLODIPINE BESYLATE 10 MG PO TABS
10.0000 mg | ORAL_TABLET | Freq: Every day | ORAL | Status: DC
  Administered 2014-05-27 – 2014-06-01 (×6): 10 mg via ORAL
  Filled 2014-05-27 (×3): qty 1
  Filled 2014-05-27: qty 2
  Filled 2014-05-27 (×3): qty 1

## 2014-05-27 MED ORDER — OXYCODONE-ACETAMINOPHEN 5-325 MG PO TABS
1.0000 | ORAL_TABLET | ORAL | Status: DC | PRN
  Administered 2014-05-27: 2 via ORAL
  Administered 2014-05-28: 1 via ORAL
  Administered 2014-05-29 – 2014-06-01 (×6): 2 via ORAL
  Filled 2014-05-27 (×7): qty 2
  Filled 2014-05-27: qty 1

## 2014-05-27 NOTE — Progress Notes (Signed)
ANTICOAGULATION CONSULT NOTE - Initial Consult  Pharmacy Consult for heparin + warfarin Indication: DVT  Allergies  Allergen Reactions  . Lisinopril Other (See Comments)    cough  . Ace Inhibitors     cough    Patient Measurements: Height: 5\' 11"  (180.3 cm) Weight: 315 lb 4.1 oz (143 kg) IBW/kg (Calculated) : 75.3 Heparin Dosing Weight: 109kg  Vital Signs: Temp: 98.3 F (36.8 C) (07/01 2132) Temp src: Oral (07/01 2132) BP: 150/84 mmHg (07/01 2132) Pulse Rate: 85 (07/01 2132)  Labs:  Recent Labs  05/24/14 0415 05/25/14 0750 05/26/14 0529 05/26/14 1550 05/26/14 2254  HGB 12.1* 11.7* 11.3*  --   --   HCT 38.4* 38.5* 36.9*  --   --   PLT 196 214 202  --   --   LABPROT  --  14.5  --   --   --   INR  --  1.13  --   --   --   HEPARINUNFRC <0.10*  --   --  0.35 0.46  CREATININE 0.74 0.59 0.63  --   --     Estimated Creatinine Clearance: 165.3 ml/min (by C-G formula based on Cr of 0.63).  Assessment: 31 YOM on warfarin PTA (home dose 10mg  daily except 5mg  on MWF) s/p INR reversal with 2 doses of vitamin K 10mg  IV as well as lap chole and ERCP. Now to resume heparin without bolus as bridge to therapeutic warfarin therapy. First level came back therapeutic. Second level is also therapeutic at 0.46 with no bleeding noted.    Goal of Therapy:  INR 2-3 Heparin level 0.3-0.7 units/ml Monitor platelets by anticoagulation protocol: Yes   Plan:   Cont heparin drip at 2600 units/hr F/u daily labs.  Curlene Dolphin

## 2014-05-27 NOTE — Progress Notes (Signed)
Consult PROGRESS NOTE    AUDREY ELLER HFG:[id number] DOB: Mar 05, 1966 DOA: 05/21/2014 PCP: Ellsworth Lennox, MD    Brief history   48 year old male with a history of diabetes mellitus, recurrent PE (on warfarin), spinal stenosis, and hypertension presented with 2-3 day history of lower abdominal pain with associated nausea and vomiting. At the time of admission, CT of abdomen and pelvis revealed a hydropic gallbladder with wall thickening and inflammatory stranding. RUQ ultrasound confirmed gallbladder wall thickening with gallbladder sludge and debris. The patient was admitted by the surgical team. The patient was started on intravenous Zosyn. Because of the patient's coagulopathy from warfarin, the patient was given vitamin K x2 doses. He was started on a heparin drip prior to surgical intervention could be performed. Laparoscopic cholecystectomy was performed on 05/24/2014 which revealed a gangrenous cholecystitis. Because of concerns of the amount of inflammation and possible CBD leak, GI was consulted for ERCP to be performed on 05/15/14.  Coumadin with heparin bridge will be restarted when cleared by GI.     Assessment/Plan:   Gangrenous cholecystitis  -Status post Lap cholecystectomy 05/24/14,  because of concerns of the amount of inflammation and possible CBD leak, GI was consulted for ERCP to be performed on 05/25/14.  -Will defer management of this problem to primary team which is general surgery along with GI. -Antibiotics as needed per GI and general surgery.     Hypoxemia due to acute on chronic diastolic CHF. EF 55-60% along with splinted breathing post op -Responded well to Lasix, now not shortness of breath and feels close to baseline. Monitor fluid administration closely -pt also has diagnosed sleep apnea-->unable to afford CPAP machine      History of recurrent PE  -Has IVC filter, case discussed with GI  physician Dr. Benson Norway. Resumed heparin without bolus on 05/26/2014 along with Coumadin overlap to be dosed by pharmacy.     Diabetes mellitus type 2  -05/22/2014 hemoglobin A1c 6.0  -Discontinue metformin during inpatient hospitalization  -NovoLog sliding scale every 4 hours--change to moderate scale due to npo    CBG (last 3)   Recent Labs  05/26/14 1715 05/26/14 2252 05/27/14 0759  GLUCAP 109* 132* 130*      Lab Results  Component Value Date   HGBA1C 6.0* 05/22/2014      Hypertension  -Continue metoprolol succinate added Norvasc for better control.     Tachycardia  -Likely due to pain and acute medical condition , no chest pain or shortness of breath. EKG showed sinus tachycardia. This problem is resolved.     Hx of tobacco use  -quit 13 years ago     Depression  -continue zoloft    Hypokalemia   Replace and monitor.       Family Communication: No family beside  Disposition Plan: Home with daughter    Antibiotics:  Zosyn 6/26-->      Procedures/Studies: Mr Thoracic Spine W Wo Contrast  05/14/2014   CLINICAL DATA:  History of T6-8 decompression. Paraplegia and incontinence.  EXAM: MRI THORACIC SPINE WITHOUT AND WITH CONTRAST  TECHNIQUE: Multiplanar and multiecho pulse sequences of the thoracic spine were obtained without and with intravenous contrast.  CONTRAST:  83mL MULTIHANCE GADOBENATE DIMEGLUMINE 529 MG/ML IV SOLN  COMPARISON:  MRI thoracic spine 12/03/2013.  FINDINGS: Multilevel Schmorl's nodes are again seen. The patient status post extensive posterior decompression as detailed in a low. Focal edema within the thoracic cord at T8-9 is again identified and does not appear markedly changed. There may also be a focus of cord signal abnormality at the T6-7 level is may be new since the prior exam. Thoracic cord signal is otherwise unremarkable.  Since the prior examination, it appears the patient has undergone revision of decompression from T6-7  to approximately T7-8 since the prior examination. There is a fluid collection in the midline of the back tracking along the laminectomy sites. The collection measures 10.3 cm craniocaudal with its largest component in the axial plane at T6-7 where it measures 2.3 by 0.9 cm. There is a smaller collection the deep subcutaneous tissues measures 7.9 cm craniocaudal by up to 1.4 cm AP by 0.4 cm transverse. These collections likely represent seromas.  C7-T1: Shallow central protrusion indents the ventral thecal sac without obvious central canal or foraminal stenosis.  T1-2: No change in a shallow disc bulge without central canal or foraminal narrowing.  T2-3: There is facet arthropathy and ligamentum flavum thickening. The central canal and foramina appear open.  T3-4: Status post posterior decompression. There is facet degenerative disease. The central canal and foramina appear open.  T4-5: Status post posterior decompression. Central canal and foramina appear open. Small T2 hyperintense collection eccentric to the right is seen in the sagittal plane only measuring 0.7 cm craniocaudal. This may be a synovial cyst off the right facet joint. It is unchanged.  T5-6: Status post posterior decompression. Facet arthropathy is present. The central canal and foramina appear open.  T6-7: The patient is status post decompression. Facet arthropathy and ligamentum flavum thickening appear unchanged. Facet arthropathy is noted.  T7-8: Revision of posterior decompression since the prior exam. Central canal stenosis appears improved although ligamentum flavum thickening and facet arthropathy continue to deform the cord. Foramina are open.  T8-9: Status post decompression. The central canal and foramina appear open.  T9-10: Status post posterior decompression. The central canal and foramina appear open.  T10-11: Status post posterior decompression. There is ligamentum flavum thickening and facet arthropathy. The central canal appears  open. Foramina appear narrowed. This level is stable.  T11-12: Ligamentum flavum thickening and facet arthropathy are identified. Central canal stenosis appears unchanged.  T12-L1: This level is imaged in the sagittal plane only and unremarkable.  IMPRESSION: The patient is status post interval revision of posterior decompression at T7-8. The appearance of the T7-8 level is improved although facet arthropathy and ligamentum flavum thickening continue to deform the cord.  Possible subtle focus of edema within the cord at T6-7 mid and present on the prior study but is better seen today.  Fluid collection extending from mid T6 to the T9-10 level likely represents a postoperative seroma as detailed above. A second more superficial collection also likely represents postoperative seroma. CSF leak or abscess are less likely but cannot be excluded.  Except as described above, the patient's examination is unchanged with edema again seen within the cord at T8-9 and multilevel posterior decompression identified.   Electronically Signed   By: Inge Rise M.D.   On: 05/14/2014 16:49   US Abdomen Complete  05/21/2014   CLINICAL DATA:  Nausea, vomiting, leukocytosis. Hydropic gallbladder with thickened wall on CT.  EXAM: ULTRASOUND ABDOMEN COMPLETE  COMPARISON:  CT abdomen and  pelvis 05/21/2014. Abdominal ultrasound 10/26/2004.  FINDINGS: Examination was limited by patient body habitus.  Gallbladder:  The gallbladder wall is prominently thickened, measuring 9 mm in thickness. Debris/ sludge was noted in the lumen. No definite shadowing gallstones were identified. Sonographic Murphy's sign was negative.  Common bile duct:  Diameter: 5 mm  Liver:  Diffusely echogenic, compatible with steatosis.  IVC:  Not visualized.  Pancreas:  Not visualized.  Spleen:  Size and appearance within normal limits.  Right Kidney:  Length: 12.2 cm.  Not well visualized.  No gross hydronephrosis.  Left Kidney:  Length: 11.0 cm. Echogenicity  within normal limits. No mass or hydronephrosis visualized.  Abdominal aorta:  Lower abdominal aorta not visualized. No aneurysm identified proximally.  Other findings:  None.  IMPRESSION: 1. Prominent gallbladder thickening with internal debris/sludge. No gallstones were identified and sonographic Murphy's sign was negative, however acute acalculous cholecystitis cannot be excluded in the appropriate clinical setting. 2. Hepatic steatosis.   Electronically Signed   By: Logan Bores   On: 05/21/2014 18:13   Ct Abdomen Pelvis W Contrast  05/21/2014   CLINICAL DATA:  Hyperglycemia. Evaluate for ileus or small-bowel obstruction. Diffuse abdominal pain.  EXAM: CT ABDOMEN AND PELVIS WITH CONTRAST  TECHNIQUE: Multidetector CT imaging of the abdomen and pelvis was performed using the standard protocol following bolus administration of intravenous contrast.  CONTRAST:  191mL OMNIPAQUE IOHEXOL 300 MG/ML  SOLN  COMPARISON:  Abdominal radiographs 05/21/2014 and CT 11/27/2013  FINDINGS: Small, chronic right pleural effusion is unchanged. Mild bibasilar atelectasis/ scarring is again seen.  Diffusely low attenuation of the liver is again noted consistent with steatosis. The gallbladder appears mildly hydropic with evidence of wall thickening and mild fat stranding medial to it. No definite gallstones are identified. No biliary dilatation is seen. 1.5 cm cyst in the lower pole the left kidney is unchanged. The spleen, adrenal glands, right kidney, and pancreas are unremarkable.  Oral contrast is present in nondilated loops of small bowel. The colon is nondilated as well. There is no evidence of bowel obstruction. The bladder is unremarkable. A few small periportal lymph nodes are unchanged from the prior CT. There is no intraperitoneal free fluid or free air.  An infrarenal IVC filter is again seen with associated collapse of the IVC at and below the filter and with 1 of the filter's tines again noted to extend into the  posterior aspect of the abdominal aorta, unchanged. Multiple collateral vessels are again noted in the anterior abdominal wall and visualized lower chest wall. Sequelae of prior laminectomies are again noted in the thoracic and lower lumbar spine.  IMPRESSION: 1. Mildly hydropic gallbladder with new wall thickening and mild surrounding inflammatory stranding. Further evaluation with right upper quadrant ultrasound is suggested to evaluate for acute cholecystitis. 2. No evidence of bowel obstruction. 3. Hepatic steatosis. 4. Small, chronic right pleural effusion.   Electronically Signed   By: Logan Bores   On: 05/21/2014 16:30   Dg Chest Port 1 View  05/22/2014   CLINICAL DATA:  Preop for cholecystectomy. Hypertension. Diabetes. Ex-smoker. Right upper quadrant pain.  EXAM: PORTABLE CHEST - 1 VIEW  COMPARISON:  Acute abdomen series of 05/21/2014 and chest radiograph of 10/21/2008. Abdominal CT 05/21/2014.  FINDINGS: Tracheostomy is appropriately positioned. Patient rotated right. Cardiomegaly accentuated by AP portable technique. Small right pleural effusion is likely similar to yesterday's CT. No pneumothorax. No congestive failure. Patchy right base airspace disease.  IMPRESSION: Small right pleural effusion and adjacent atelectasis  versus early infection. Consider short term radiographic followup.  Cardiomegaly and low lung volumes, without congestive failure.   Electronically Signed   By: Abigail Miyamoto M.D.   On: 05/22/2014 16:20   Dg Abd Acute W/chest  05/21/2014   CLINICAL DATA:  Constipation, shortness of breath.  EXAM: ACUTE ABDOMEN SERIES (ABDOMEN 2 VIEW & CHEST 1 VIEW)  COMPARISON:  10/21/2008 chest x-ray  FINDINGS: Lungs are hypoinflated with subtle density of the lateral right base likely scarring/atelectasis versus small amount of pleural fluid. There is stable cardiomegaly. Fusion hardware is present over the cervical spine unchanged. There is mild degenerative change of the spine.  Abdominal  images demonstrate air throughout the colon. There are several air-filled minimally dilated small bowel loops present. There is no free air. There are mild degenerative changes of the hips. IVC filter is in adequate position.  IMPRESSION: Several air-filled mildly dilated small bowel loops with air seen throughout the colon. Findings may be due to ileus versus early/partial small bowel obstructive process.  Hypoinflation with minimal lateral right basilar density likely scarring/atelectasis versus small amount of right pleural fluid.  Cardiomegaly.   Electronically Signed   By: Marin Olp M.D.   On: 05/21/2014 12:46         Subjective:  Patient denies fevers, chills, headache, chest pain, dyspnea, nausea, vomiting, diarrhea, abdominal pain, dysuria, hematuria   Objective: Filed Vitals:   05/26/14 1758 05/26/14 2132 05/27/14 0545 05/27/14 0830  BP: 162/85 150/84 165/89   Pulse: 94 85 81   Temp: 98.2 F (36.8 C) 98.3 F (36.8 C) 97.9 F (36.6 C)   TempSrc: Oral Oral Oral   Resp: 17 19 17    Height:      Weight:      SpO2: 94% 100% 93% 97%    Intake/Output Summary (Last 24 hours) at 05/27/14 0939 Last data filed at 05/27/14 0517  Gross per 24 hour  Intake 969.83 ml  Output   2375 ml  Net -1405.17 ml   Weight change:  Exam:   General:  Pt is alert, follows commands appropriately, not in acute distress  HEENT: No icterus, No thrush,  [place]/AT  Cardiovascular: RRR, S1/S2, no rubs, no gallops  Respiratory: Fine bibasilar crackles. No wheezing. Good air movement  Abdomen: Soft/+BS, non tender, non distended, no guarding  Extremities: 1+ edema, No lymphangitis, No petechiae, No rashes, no synovitis  Data Reviewed: Basic Metabolic Panel:  Recent Labs Lab 05/23/14 0620 05/24/14 0415 05/25/14 0750 05/26/14 0529 05/27/14 0342  NA 132* 137 142 140 141  K 4.0 3.6* 3.7 3.7 3.2*  CL 92* 95* 100 98 98  CO2 23 30 33* 32 35*  GLUCOSE 115* 96 97 84 127*  BUN 12 14 10 7  5*   CREATININE 0.73 0.74 0.59 0.63 0.71  CALCIUM 8.8 9.0 8.8 8.4 8.9   Liver Function Tests:  Recent Labs Lab 05/22/14 0445 05/23/14 0620 05/24/14 0415 05/25/14 0750 05/26/14 0529  AST 126* 83* 71* 69* 39*  ALT 115* 110* 102* 95* 69*  ALKPHOS 115 113 114 113 98  BILITOT 4.1* 3.7* 2.5* 1.1 0.8  PROT 8.1 7.1 7.0 7.0 6.8  ALBUMIN 3.3* 2.6* 2.3* 2.3* 2.1*    Recent Labs Lab 05/21/14 1146 05/22/14 0445  LIPASE 19 13   No results found for this basename: AMMONIA,  in the last 168 hours CBC:  Recent Labs Lab 05/21/14 1146  05/23/14 0620 05/24/14 0415 05/25/14 0750 05/26/14 0529 05/27/14 0342  WBC 15.5*  < >  18.3* 12.3* 9.1 7.5 7.7  NEUTROABS 12.8*  --   --   --   --   --   --   HGB 15.1  < > 13.1 12.1* 11.7* 11.3* 11.1*  HCT 47.3  < > 40.7 38.4* 38.5* 36.9* 36.0*  MCV 84.9  < > 83.7 85.5 88.7 87.0 86.7  PLT 238  < > 197 196 214 202 216  < > = values in this interval not displayed. Cardiac Enzymes: No results found for this basename: CKTOTAL, CKMB, CKMBINDEX, TROPONINI,  in the last 168 hours BNP: No components found with this basename: POCBNP,  CBG:  Recent Labs Lab 05/26/14 0800 05/26/14 1130 05/26/14 1715 05/26/14 2252 05/27/14 0759  GLUCAP 114* 105* 109* 132* 130*    Recent Results (from the past 240 hour(s))  SURGICAL PCR SCREEN     Status: None   Collection Time    05/22/14 10:20 AM      Result Value Ref Range Status   MRSA, PCR NEGATIVE  NEGATIVE Final   Staphylococcus aureus NEGATIVE  NEGATIVE Final   Comment:            The Xpert SA Assay (FDA     approved for NASAL specimens     in patients over 15 years of age),     is one component of     a comprehensive surveillance     program.  Test performance has     been validated by [employer] for patients greater     than or equal to 52 year old.     It is not intended     to diagnose infection nor to     guide or monitor treatment.     Scheduled Meds: . insulin aspart  0-15 Units  Subcutaneous TID WC  . metoprolol succinate  25 mg Oral Daily  . pantoprazole (PROTONIX) IV  40 mg Intravenous QHS  . potassium chloride  40 mEq Oral Q4H  . sertraline  100 mg Oral Daily  . torsemide  20 mg Oral Daily  . traZODone  100 mg Oral QHS  . warfarin  12.5 mg Oral ONCE-1800  . Warfarin - Pharmacist Dosing Inpatient   Does not apply q1800   Continuous Infusions: . heparin 2,600 Units/hr (05/27/14 0505)  . lactated ringers 10 mL/hr at 05/25/14 2218     Thurnell Lose, DO  [facility] Pager [phone]  If 7PM-7AM, please contact night-coverage [email or web address] Password [email or web address] 05/27/2014, 9:39 AM   LOS: 6 days

## 2014-05-27 NOTE — Progress Notes (Signed)
[place] for heparin + warfarin Indication: DVT  Allergies  Allergen Reactions  . Lisinopril Other (See Comments)    cough  . Ace Inhibitors     cough    Patient Measurements: Height: 5\' 11"  (180.3 cm) Weight: 315 lb 4.1 oz (143 kg) IBW/kg (Calculated) : 75.3 Heparin Dosing Weight: 109kg  Vital Signs: Temp: 97.9 F (36.6 C) (07/02 0545) Temp src: Oral (07/02 0545) BP: 165/89 mmHg (07/02 0545) Pulse Rate: 81 (07/02 0545)  Labs:  Recent Labs  05/25/14 0750 05/26/14 0529 05/26/14 1550 05/26/14 2254 05/27/14 0342  HGB 11.7* 11.3*  --   --  11.1*  HCT 38.5* 36.9*  --   --  36.0*  PLT 214 202  --   --  216  LABPROT 14.5  --   --   --  13.8  INR 1.13  --   --   --  1.06  HEPARINUNFRC  --   --  0.35 0.46 0.58  CREATININE 0.59 0.63  --   --  0.71    Estimated Creatinine Clearance: 165.3 ml/min (by C-G formula based on Cr of 0.71).  Assessment: 68 YOM on warfarin PTA (home dose 10mg  daily except 5mg  on MWF) s/p INR reversal with 2 doses of vitamin K 10mg  IV as well as lap chole and ERCP. Now to resume heparin without bolus as bridge to therapeutic warfarin therapy. Heparin level therapeutic x3 with most recent level being 0.58. INR this morning 1.06. Hgb low but stable, plts wnl. No bleeding noted.  Goal of Therapy:  INR 2-3 Heparin level 0.3-0.7 units/ml Monitor platelets by anticoagulation protocol: Yes   Plan:  1. Cont heparin drip at 2600 units/hr 2. Warfarin 12.5mg  po x1 tonight 3. Daily CBC, HL and INR 4. Follow for s/s bleeding  Haile Toppins D. Caylan Schifano, PharmD, BCPS Clinical Pharmacist Pager: [phone] 05/27/2014 8:56 AM

## 2014-05-27 NOTE — Progress Notes (Signed)
JP drainage more serosanguinous. Heparin/Coumadin. WC bound at home. Patient examined and I agree with the assessment and plan  Georganna Skeans, MD, MPH, FACS Trauma: [phone] General Surgery: [phone]  05/27/2014 11:16 AM

## 2014-05-27 NOTE — Progress Notes (Signed)
Patient ID: Nicolas [name], male   DOB: 07/15/1966, 48 y.o.   MRN: [id number]   Subjective: Tolerating solids, had a BM yesterday.  Wants to get OOB today with hoyer lift, states he does not ambulate.  Pulling 1017m on IS. VSS.  Afebrile.   Objective:  Vital signs:  Filed Vitals:   05/26/14 1354 05/26/14 1758 05/26/14 2132 05/27/14 0545  BP: 139/76 162/85 150/84 165/89  Pulse: 87 94 85 81  Temp: 98.1 F (36.7 C) 98.2 F (36.8 C) 98.3 F (36.8 C) 97.9 F (36.6 C)  TempSrc: Oral Oral Oral Oral  Resp: '17 17 19 17  ' Height:      Weight:      SpO2: 93% 94% 100% 93%    Last BM Date: 05/26/14  Intake/Output   Yesterday:  07/01 0701 - 07/02 0700 In: 969.8 [P.O.:360; I.V.:309.8; IV Piggyback:300] Out: 2775 [Urine:2725; Drains:50] This shift:    I/O last 3 completed shifts: In: 1569.8 [P.O.:960; I.V.:309.8; IV Piggyback:300] Out: 33244[Urine:3400; Drains:80]   Physical Exam: General: Pt awake/alert/oriented x4 in no acute distress Abdomen: Soft.  Nondistended.  Mildly tender at incisions only. Incision sites are c/d/i.  JP drain to RUQ with serosanguinous output, did not appreciate any bile.  No evidence of peritonitis.  No incarcerated hernias.   Problem List:   Principal Problem:   Acute acalculous cholecystitis Active Problems:   Type II or unspecified type diabetes mellitus without mention of complication, not stated as uncontrolled   Chronic anticoagulation   Hypoxemia    Results:   Labs: Results for orders placed during the hospital encounter of 05/21/14 (from the past 48 hour(s))  GLUCOSE, CAPILLARY     Status: None   Collection Time    05/25/14  8:00 AM      Result Value Ref Range   Glucose-Capillary 99  70 - 99 mg/dL  GLUCOSE, CAPILLARY     Status: None   Collection Time    05/25/14 11:35 AM      Result Value Ref Range   Glucose-Capillary 92  70 - 99 mg/dL  GLUCOSE, CAPILLARY     Status: Abnormal   Collection Time    05/25/14  3:35 PM      Result  Value Ref Range   Glucose-Capillary 103 (*) 70 - 99 mg/dL   Comment 1 Notify RN    GLUCOSE, CAPILLARY     Status: None   Collection Time    05/25/14  4:59 PM      Result Value Ref Range   Glucose-Capillary 97  70 - 99 mg/dL  GLUCOSE, CAPILLARY     Status: Abnormal   Collection Time    05/25/14  9:02 PM      Result Value Ref Range   Glucose-Capillary 118 (*) 70 - 99 mg/dL   Comment 1 Notify RN    CBC     Status: Abnormal   Collection Time    05/26/14  5:29 AM      Result Value Ref Range   WBC 7.5  4.0 - 10.5 K/uL   RBC 4.24  4.22 - 5.81 MIL/uL   Hemoglobin 11.3 (*) 13.0 - 17.0 g/dL   HCT 36.9 (*) 39.0 - 52.0 %   MCV 87.0  78.0 - 100.0 fL   MCH 26.7  26.0 - 34.0 pg   MCHC 30.6  30.0 - 36.0 g/dL   RDW 17.3 (*) 11.5 - 15.5 %   Platelets 202  150 - 400 K/uL  COMPREHENSIVE  METABOLIC PANEL     Status: Abnormal   Collection Time    05/26/14  5:29 AM      Result Value Ref Range   Sodium 140  137 - 147 mEq/L   Potassium 3.7  3.7 - 5.3 mEq/L   Chloride 98  96 - 112 mEq/L   CO2 32  19 - 32 mEq/L   Glucose, Bld 84  70 - 99 mg/dL   BUN 7  6 - 23 mg/dL   Creatinine, Ser 0.63  0.50 - 1.35 mg/dL   Calcium 8.4  8.4 - 10.5 mg/dL   Total Protein 6.8  6.0 - 8.3 g/dL   Albumin 2.1 (*) 3.5 - 5.2 g/dL   AST 39 (*) 0 - 37 U/L   ALT 69 (*) 0 - 53 U/L   Alkaline Phosphatase 98  39 - 117 U/L   Total Bilirubin 0.8  0.3 - 1.2 mg/dL   GFR calc non Af Amer >90  >90 mL/min   GFR calc Af Amer >90  >90 mL/min   Comment: (NOTE)     The eGFR has been calculated using the CKD EPI equation.     This calculation has not been validated in all clinical situations.     eGFR's persistently <90 mL/min signify possible Chronic Kidney     Disease.  GLUCOSE, CAPILLARY     Status: Abnormal   Collection Time    05/26/14  8:00 AM      Result Value Ref Range   Glucose-Capillary 114 (*) 70 - 99 mg/dL  GLUCOSE, CAPILLARY     Status: Abnormal   Collection Time    05/26/14 11:30 AM      Result Value Ref Range    Glucose-Capillary 105 (*) 70 - 99 mg/dL  HEPARIN LEVEL (UNFRACTIONATED)     Status: None   Collection Time    05/26/14  3:50 PM      Result Value Ref Range   Heparin Unfractionated 0.35  0.30 - 0.70 IU/mL   Comment:            IF HEPARIN RESULTS ARE BELOW     EXPECTED VALUES, AND PATIENT     DOSAGE HAS BEEN CONFIRMED,     SUGGEST FOLLOW UP TESTING     OF ANTITHROMBIN III LEVELS.  GLUCOSE, CAPILLARY     Status: Abnormal   Collection Time    05/26/14  5:15 PM      Result Value Ref Range   Glucose-Capillary 109 (*) 70 - 99 mg/dL  GLUCOSE, CAPILLARY     Status: Abnormal   Collection Time    05/26/14 10:52 PM      Result Value Ref Range   Glucose-Capillary 132 (*) 70 - 99 mg/dL  HEPARIN LEVEL (UNFRACTIONATED)     Status: None   Collection Time    05/26/14 10:54 PM      Result Value Ref Range   Heparin Unfractionated 0.46  0.30 - 0.70 IU/mL   Comment:            IF HEPARIN RESULTS ARE BELOW     EXPECTED VALUES, AND PATIENT     DOSAGE HAS BEEN CONFIRMED,     SUGGEST FOLLOW UP TESTING     OF ANTITHROMBIN III LEVELS.  CBC     Status: Abnormal   Collection Time    05/27/14  3:42 AM      Result Value Ref Range   WBC 7.7  4.0 - 10.5 K/uL  RBC 4.15 (*) 4.22 - 5.81 MIL/uL   Hemoglobin 11.1 (*) 13.0 - 17.0 g/dL   HCT 36.0 (*) 39.0 - 52.0 %   MCV 86.7  78.0 - 100.0 fL   MCH 26.7  26.0 - 34.0 pg   MCHC 30.8  30.0 - 36.0 g/dL   RDW 17.0 (*) 11.5 - 15.5 %   Platelets 216  150 - 400 K/uL  HEPARIN LEVEL (UNFRACTIONATED)     Status: None   Collection Time    05/27/14  3:42 AM      Result Value Ref Range   Heparin Unfractionated 0.58  0.30 - 0.70 IU/mL   Comment:            IF HEPARIN RESULTS ARE BELOW     EXPECTED VALUES, AND PATIENT     DOSAGE HAS BEEN CONFIRMED,     SUGGEST FOLLOW UP TESTING     OF ANTITHROMBIN III LEVELS.  PROTIME-INR     Status: None   Collection Time    05/27/14  3:42 AM      Result Value Ref Range   Prothrombin Time 13.8  11.6 - 15.2 seconds   INR 1.06   0.00 - 6.83  BASIC METABOLIC PANEL     Status: Abnormal   Collection Time    05/27/14  3:42 AM      Result Value Ref Range   Sodium 141  137 - 147 mEq/L   Potassium 3.2 (*) 3.7 - 5.3 mEq/L   Chloride 98  96 - 112 mEq/L   CO2 35 (*) 19 - 32 mEq/L   Glucose, Bld 127 (*) 70 - 99 mg/dL   BUN 5 (*) 6 - 23 mg/dL   Creatinine, Ser 0.71  0.50 - 1.35 mg/dL   Calcium 8.9  8.4 - 10.5 mg/dL   GFR calc non Af Amer >90  >90 mL/min   GFR calc Af Amer >90  >90 mL/min   Comment: (NOTE)     The eGFR has been calculated using the CKD EPI equation.     This calculation has not been validated in all clinical situations.     eGFR's persistently <90 mL/min signify possible Chronic Kidney     Disease.   Anion gap 8  5 - 15    Imaging / Studies: Dg Ercp  05/25/2014   CLINICAL DATA:  Bile leak  EXAM: ERCP  TECHNIQUE: Multiple spot images obtained with the fluoroscopic device and submitted for interpretation post-procedure.  : FINDINGS:  Three spot films were obtained intraoperatively. The initial films show injection into the biliary tree. A surgical drain is seen in place. Some contrast is noted near the cystic duct remnant which may represent a leak. Correlation with the findings and ERCP is recommended. A biliary stent was then placed. 1 min and 50 seconds of fluoroscopy was utilized.  These images were submitted for radiologic interpretation only. Please see the procedural report for the amount of contrast and the fluoroscopy time utilized.   Electronically Signed   By: Inez Catalina M.D.   On: 05/25/2014 15:37    Scheduled Meds: . insulin aspart  0-15 Units Subcutaneous TID WC  . metoprolol succinate  25 mg Oral Daily  . pantoprazole (PROTONIX) IV  40 mg Intravenous QHS  . sertraline  100 mg Oral Daily  . torsemide  20 mg Oral Daily  . traZODone  100 mg Oral QHS  . Warfarin - Pharmacist Dosing Inpatient   Does not apply [phone]  Continuous Infusions: . heparin 2,600 Units/hr (05/27/14 0505)  .  lactated ringers 10 mL/hr at 05/25/14 2218   PRN Meds:.HYDROmorphone (DILAUDID) injection, menthol-cetylpyridinium, morphine injection, ondansetron, oxyCODONE-acetaminophen   Antibiotics: Anti-infectives   Start     Dose/Rate Route Frequency Ordered Stop   05/21/14 2200  piperacillin-tazobactam (ZOSYN) IVPB 3.375 g     3.375 g 12.5 mL/hr over 240 Minutes Intravenous 3 times per day 05/21/14 2054        Assessment/Plan:  Gangrenous cholecystitis laparoscopic cholecystectomy---Dr. Grandville Silos 05/24/14 POD#3  Bile leak - S/P ERCP and stent 6/30, appreciate GI assistance, no significant leak visualized, continue JP to remain in place DM/HTN/Hx PE - appreciate hospitalist service management, resuming coumadin  ID - Zosyn, D/C FEN - tolerating diet, add oral pain meds  Erby Pian, [facility] Pager [phone] Office [phone]  05/27/2014 7:59 AM

## 2014-05-28 LAB — CBC
HCT: 38.7 % — ABNORMAL LOW (ref 39.0–52.0)
Hemoglobin: 11.8 g/dL — ABNORMAL LOW (ref 13.0–17.0)
MCH: 26.6 pg (ref 26.0–34.0)
MCHC: 30.5 g/dL (ref 30.0–36.0)
MCV: 87.2 fL (ref 78.0–100.0)
Platelets: 249 K/uL (ref 150–400)
RBC: 4.44 MIL/uL (ref 4.22–5.81)
RDW: 17 % — ABNORMAL HIGH (ref 11.5–15.5)
WBC: 8 K/uL (ref 4.0–10.5)

## 2014-05-28 LAB — GLUCOSE, CAPILLARY
Glucose-Capillary: 103 mg/dL — ABNORMAL HIGH (ref 70–99)
Glucose-Capillary: 110 mg/dL — ABNORMAL HIGH (ref 70–99)
Glucose-Capillary: 125 mg/dL — ABNORMAL HIGH (ref 70–99)
Glucose-Capillary: 130 mg/dL — ABNORMAL HIGH (ref 70–99)

## 2014-05-28 LAB — POTASSIUM: Potassium: 3.6 mEq/L — ABNORMAL LOW (ref 3.7–5.3)

## 2014-05-28 LAB — PROTIME-INR
INR: 1.08 (ref 0.00–1.49)
Prothrombin Time: 14 seconds (ref 11.6–15.2)

## 2014-05-28 LAB — HEPARIN LEVEL (UNFRACTIONATED): Heparin Unfractionated: 0.67 [IU]/mL (ref 0.30–0.70)

## 2014-05-28 MED ORDER — PANTOPRAZOLE SODIUM 40 MG PO TBEC
40.0000 mg | DELAYED_RELEASE_TABLET | Freq: Every day | ORAL | Status: DC
  Administered 2014-05-28 – 2014-06-01 (×5): 40 mg via ORAL
  Filled 2014-05-28 (×5): qty 1

## 2014-05-28 MED ORDER — HEPARIN (PORCINE) IN NACL 100-0.45 UNIT/ML-% IJ SOLN
2800.0000 [IU]/h | INTRAMUSCULAR | Status: DC
  Administered 2014-05-28 – 2014-05-29 (×3): 2500 [IU]/h via INTRAVENOUS
  Administered 2014-05-30: 2600 [IU]/h via INTRAVENOUS
  Administered 2014-05-30: 2500 [IU]/h via INTRAVENOUS
  Filled 2014-05-28 (×3): qty 250

## 2014-05-28 MED ORDER — WARFARIN SODIUM 2.5 MG PO TABS
12.5000 mg | ORAL_TABLET | Freq: Once | ORAL | Status: AC
  Administered 2014-05-28: 12.5 mg via ORAL
  Filled 2014-05-28: qty 1

## 2014-05-28 MED ORDER — POTASSIUM CHLORIDE CRYS ER 20 MEQ PO TBCR
40.0000 meq | EXTENDED_RELEASE_TABLET | ORAL | Status: AC
  Administered 2014-05-28 (×2): 40 meq via ORAL
  Filled 2014-05-28 (×2): qty 2

## 2014-05-28 MED ORDER — MAGNESIUM SULFATE IN D5W 10-5 MG/ML-% IV SOLN
1.0000 g | Freq: Once | INTRAVENOUS | Status: AC
  Administered 2014-05-28: 1 g via INTRAVENOUS
  Filled 2014-05-28: qty 100

## 2014-05-28 NOTE — Progress Notes (Signed)
ANTICOAGULATION CONSULT NOTE - FOLLOW UP  Pharmacy Consult:  Heparin + Coumadin Indication: DVT + recurrent PE's  Allergies  Allergen Reactions  . Lisinopril Other (See Comments)    cough  . Ace Inhibitors     cough    Patient Measurements: Height: 5\' 11"  (180.3 cm) Weight: 315 lb 4.1 oz (143 kg) IBW/kg (Calculated) : 75.3 Heparin Dosing Weight: 109kg  Vital Signs: Temp: 98.2 F (36.8 C) (07/03 0601) Temp src: Oral (07/03 0601) BP: 141/67 mmHg (07/03 0933) Pulse Rate: 76 (07/03 0933)  Labs:  Recent Labs  05/26/14 0529  05/26/14 2254 05/27/14 0342 05/28/14 0505  HGB 11.3*  --   --  11.1* 11.8*  HCT 36.9*  --   --  36.0* 38.7*  PLT 202  --   --  216 249  LABPROT  --   --   --  13.8 14.0  INR  --   --   --  1.06 1.08  HEPARINUNFRC  --   < > 0.46 0.58 0.67  CREATININE 0.63  --   --  0.71  --   < > = values in this interval not displayed.  Estimated Creatinine Clearance: 165.3 ml/min (by C-G formula based on Cr of 0.71).    Assessment: 32 YOM on warfarin PTA (home dose 10mg  daily except 5mg  on MWF) s/p INR reversal with 2 doses of Vitamin K 10mg  IV as well as lap chole and ERCP.  Currently on IV heparin bridge and Coumadin for history of recurrent VTE's.  Heparin level therapeutic and is trending up.  INR remains sub-therapeutic and unchanged; it is often challenging to achieve therapeutic level post reversal with Vitamin K.  No bleeding reported.   Goal of Therapy:  INR 2-3 Heparin level 0.3-0.7 units/ml Monitor platelets by anticoagulation protocol: Yes    Plan:  - Decrease heparin gtt to 2500 units/hr - Coumadin 12.5 mg PO today, plan to increase dose tomorrow if INR does not rise appropriately - Daily HL / CBC / PT / INR - Change PPI to PO     Ilya Neely D. Mina Marble, PharmD, [place] Pager:  [phone] 05/28/2014, 2:03 PM

## 2014-05-28 NOTE — Progress Notes (Signed)
Consult PROGRESS NOTE    Nicolas [name] YDX:[id number] DOB: Apr 05, 1966 DOA: 05/21/2014 PCP: Ellsworth Lennox, MD    Brief history   48 year old male with a history of diabetes mellitus, recurrent PE (on warfarin), spinal stenosis, and hypertension presented with 2-3 day history of lower abdominal pain with associated nausea and vomiting. At the time of admission, CT of abdomen and pelvis revealed a hydropic gallbladder with wall thickening and inflammatory stranding. RUQ ultrasound confirmed gallbladder wall thickening with gallbladder sludge and debris. The patient was admitted by the surgical team. The patient was started on intravenous Zosyn. Because of the patient's coagulopathy from warfarin, the patient was given vitamin K x2 doses. He was started on a heparin drip prior to surgical intervention could be performed. Laparoscopic cholecystectomy was performed on 05/24/2014 which revealed a gangrenous cholecystitis. Because of concerns of the amount of inflammation and possible CBD leak, GI was consulted for ERCP to be performed on 05/15/14.  Coumadin with heparin bridge will be restarted when cleared by GI.     Hospitalist team will sign off. Please call with any medical questions. Things to follow   1. Repeat potassium and magnesium level in the morning.   2. Stop heparin once INR is 2. Then  discharge him home dose Coumadin.   3. Consider stopping antibiotics when appropriate.     Assessment/Plan:      Gangrenous cholecystitis  -Status post Lap cholecystectomy 05/24/14,  because of concerns of the amount of inflammation and possible CBD leak, GI was consulted for ERCP to be performed on 05/25/14.  -Will defer management of this problem to primary team which is general surgery along with GI. -Antibiotics as needed per GI and general surgery.     Hypoxemia due to acute on chronic diastolic CHF. EF 55-60% along with  splinted breathing post op -Responded well to Lasix, now not shortness of breath and feels close to baseline. Monitor fluid administration closely -pt also has diagnosed sleep apnea-->unable to afford CPAP machine      History of recurrent PE  -Has IVC filter, case discussed with GI physician Dr. Benson Norway. Resumed heparin without bolus on 05/26/2014 along with Coumadin overlap to be dosed by pharmacy. -Once INR is above 2 heparin can be stopped and patient can be discharged on home dose Coumadin.    Diabetes mellitus type 2  -05/22/2014 hemoglobin A1c 6.0  -Discontinue metformin during inpatient hospitalization  -NovoLog sliding scale every 4 hours--change to moderate scale due to npo    CBG (last 3)   Recent Labs  05/27/14 1653 05/27/14 2151 05/28/14 0802  GLUCAP 105* 134* 103*      Lab Results  Component Value Date   HGBA1C 6.0* 05/22/2014      Hypertension  -Continue metoprolol succinate added Norvasc for better control.     Tachycardia  -Likely due to pain and acute medical condition , no chest pain or shortness of breath. EKG showed sinus tachycardia. This problem is resolved.     Hx of tobacco use  -quit 13 years ago     Depression  -continue zoloft    Hypokalemia   Replace placed, recheck in the morning with magnesium levels.  Family Communication: No family beside  Disposition Plan: Home with daughter    Antibiotics:  Zosyn 6/26-->      Procedures/Studies: Mr Thoracic Spine W Wo Contrast  05/14/2014   CLINICAL DATA:  History of T6-8 decompression. Paraplegia and incontinence.  EXAM: MRI THORACIC SPINE WITHOUT AND WITH CONTRAST  TECHNIQUE: Multiplanar and multiecho pulse sequences of the thoracic spine were obtained without and with intravenous contrast.  CONTRAST:  17mL MULTIHANCE GADOBENATE DIMEGLUMINE 529 MG/ML IV SOLN  COMPARISON:  MRI thoracic spine 12/03/2013.  FINDINGS: Multilevel Schmorl's nodes are again seen. The patient  status post extensive posterior decompression as detailed in a low. Focal edema within the thoracic cord at T8-9 is again identified and does not appear markedly changed. There may also be a focus of cord signal abnormality at the T6-7 level is may be new since the prior exam. Thoracic cord signal is otherwise unremarkable.  Since the prior examination, it appears the patient has undergone revision of decompression from T6-7 to approximately T7-8 since the prior examination. There is a fluid collection in the midline of the back tracking along the laminectomy sites. The collection measures 10.3 cm craniocaudal with its largest component in the axial plane at T6-7 where it measures 2.3 by 0.9 cm. There is a smaller collection the deep subcutaneous tissues measures 7.9 cm craniocaudal by up to 1.4 cm AP by 0.4 cm transverse. These collections likely represent seromas.  C7-T1: Shallow central protrusion indents the ventral thecal sac without obvious central canal or foraminal stenosis.  T1-2: No change in a shallow disc bulge without central canal or foraminal narrowing.  T2-3: There is facet arthropathy and ligamentum flavum thickening. The central canal and foramina appear open.  T3-4: Status post posterior decompression. There is facet degenerative disease. The central canal and foramina appear open.  T4-5: Status post posterior decompression. Central canal and foramina appear open. Small T2 hyperintense collection eccentric to the right is seen in the sagittal plane only measuring 0.7 cm craniocaudal. This may be a synovial cyst off the right facet joint. It is unchanged.  T5-6: Status post posterior decompression. Facet arthropathy is present. The central canal and foramina appear open.  T6-7: The patient is status post decompression. Facet arthropathy and ligamentum flavum thickening appear unchanged. Facet arthropathy is noted.  T7-8: Revision of posterior decompression since the prior exam. Central canal  stenosis appears improved although ligamentum flavum thickening and facet arthropathy continue to deform the cord. Foramina are open.  T8-9: Status post decompression. The central canal and foramina appear open.  T9-10: Status post posterior decompression. The central canal and foramina appear open.  T10-11: Status post posterior decompression. There is ligamentum flavum thickening and facet arthropathy. The central canal appears open. Foramina appear narrowed. This level is stable.  T11-12: Ligamentum flavum thickening and facet arthropathy are identified. Central canal stenosis appears unchanged.  T12-L1: This level is imaged in the sagittal plane only and unremarkable.  IMPRESSION: The patient is status post interval revision of posterior decompression at T7-8. The appearance of the T7-8 level is improved although facet arthropathy and ligamentum flavum thickening continue to deform the cord.  Possible subtle focus of edema within the cord at T6-7 mid and present on the prior study but is better seen today.  Fluid collection extending from mid T6 to the T9-10 level likely represents a postoperative seroma as detailed above. A second more superficial collection also likely represents postoperative seroma. CSF leak or abscess are less likely but cannot be  excluded.  Except as described above, the patient's examination is unchanged with edema again seen within the cord at T8-9 and multilevel posterior decompression identified.   Electronically Signed   By: Inge Rise M.D.   On: 05/14/2014 16:49   US Abdomen Complete  05/21/2014   CLINICAL DATA:  Nausea, vomiting, leukocytosis. Hydropic gallbladder with thickened wall on CT.  EXAM: ULTRASOUND ABDOMEN COMPLETE  COMPARISON:  CT abdomen and pelvis 05/21/2014. Abdominal ultrasound 10/26/2004.  FINDINGS: Examination was limited by patient body habitus.  Gallbladder:  The gallbladder wall is prominently thickened, measuring 9 mm in thickness. Debris/ sludge was  noted in the lumen. No definite shadowing gallstones were identified. Sonographic Murphy's sign was negative.  Common bile duct:  Diameter: 5 mm  Liver:  Diffusely echogenic, compatible with steatosis.  IVC:  Not visualized.  Pancreas:  Not visualized.  Spleen:  Size and appearance within normal limits.  Right Kidney:  Length: 12.2 cm.  Not well visualized.  No gross hydronephrosis.  Left Kidney:  Length: 11.0 cm. Echogenicity within normal limits. No mass or hydronephrosis visualized.  Abdominal aorta:  Lower abdominal aorta not visualized. No aneurysm identified proximally.  Other findings:  None.  IMPRESSION: 1. Prominent gallbladder thickening with internal debris/sludge. No gallstones were identified and sonographic Murphy's sign was negative, however acute acalculous cholecystitis cannot be excluded in the appropriate clinical setting. 2. Hepatic steatosis.   Electronically Signed   By: Logan Bores   On: 05/21/2014 18:13   Ct Abdomen Pelvis W Contrast  05/21/2014   CLINICAL DATA:  Hyperglycemia. Evaluate for ileus or small-bowel obstruction. Diffuse abdominal pain.  EXAM: CT ABDOMEN AND PELVIS WITH CONTRAST  TECHNIQUE: Multidetector CT imaging of the abdomen and pelvis was performed using the standard protocol following bolus administration of intravenous contrast.  CONTRAST:  1103mL OMNIPAQUE IOHEXOL 300 MG/ML  SOLN  COMPARISON:  Abdominal radiographs 05/21/2014 and CT 11/27/2013  FINDINGS: Small, chronic right pleural effusion is unchanged. Mild bibasilar atelectasis/ scarring is again seen.  Diffusely low attenuation of the liver is again noted consistent with steatosis. The gallbladder appears mildly hydropic with evidence of wall thickening and mild fat stranding medial to it. No definite gallstones are identified. No biliary dilatation is seen. 1.5 cm cyst in the lower pole the left kidney is unchanged. The spleen, adrenal glands, right kidney, and pancreas are unremarkable.  Oral contrast is present  in nondilated loops of small bowel. The colon is nondilated as well. There is no evidence of bowel obstruction. The bladder is unremarkable. A few small periportal lymph nodes are unchanged from the prior CT. There is no intraperitoneal free fluid or free air.  An infrarenal IVC filter is again seen with associated collapse of the IVC at and below the filter and with 1 of the filter's tines again noted to extend into the posterior aspect of the abdominal aorta, unchanged. Multiple collateral vessels are again noted in the anterior abdominal wall and visualized lower chest wall. Sequelae of prior laminectomies are again noted in the thoracic and lower lumbar spine.  IMPRESSION: 1. Mildly hydropic gallbladder with new wall thickening and mild surrounding inflammatory stranding. Further evaluation with right upper quadrant ultrasound is suggested to evaluate for acute cholecystitis. 2. No evidence of bowel obstruction. 3. Hepatic steatosis. 4. Small, chronic right pleural effusion.   Electronically Signed   By: Logan Bores   On: 05/21/2014 16:30   Dg Chest Port 1 View  05/22/2014   CLINICAL DATA:  Preop for cholecystectomy.  Hypertension. Diabetes. Ex-smoker. Right upper quadrant pain.  EXAM: PORTABLE CHEST - 1 VIEW  COMPARISON:  Acute abdomen series of 05/21/2014 and chest radiograph of 10/21/2008. Abdominal CT 05/21/2014.  FINDINGS: Tracheostomy is appropriately positioned. Patient rotated right. Cardiomegaly accentuated by AP portable technique. Small right pleural effusion is likely similar to yesterday's CT. No pneumothorax. No congestive failure. Patchy right base airspace disease.  IMPRESSION: Small right pleural effusion and adjacent atelectasis versus early infection. Consider short term radiographic followup.  Cardiomegaly and low lung volumes, without congestive failure.   Electronically Signed   By: Abigail Miyamoto M.D.   On: 05/22/2014 16:20   Dg Abd Acute W/chest  05/21/2014   CLINICAL DATA:   Constipation, shortness of breath.  EXAM: ACUTE ABDOMEN SERIES (ABDOMEN 2 VIEW & CHEST 1 VIEW)  COMPARISON:  10/21/2008 chest x-ray  FINDINGS: Lungs are hypoinflated with subtle density of the lateral right base likely scarring/atelectasis versus small amount of pleural fluid. There is stable cardiomegaly. Fusion hardware is present over the cervical spine unchanged. There is mild degenerative change of the spine.  Abdominal images demonstrate air throughout the colon. There are several air-filled minimally dilated small bowel loops present. There is no free air. There are mild degenerative changes of the hips. IVC filter is in adequate position.  IMPRESSION: Several air-filled mildly dilated small bowel loops with air seen throughout the colon. Findings may be due to ileus versus early/partial small bowel obstructive process.  Hypoinflation with minimal lateral right basilar density likely scarring/atelectasis versus small amount of right pleural fluid.  Cardiomegaly.   Electronically Signed   By: Marin Olp M.D.   On: 05/21/2014 12:46         Subjective:  Patient denies fevers, chills, headache, chest pain, dyspnea, nausea, vomiting, diarrhea, abdominal pain, dysuria, hematuria   Objective: Filed Vitals:   05/27/14 0830 05/27/14 1140 05/27/14 1254 05/28/14 0601  BP:  116/61 172/92 130/55  Pulse:   80 81  Temp:   97.8 F (36.6 C) 98.2 F (36.8 C)  TempSrc:   Oral Oral  Resp:   17 18  Height:      Weight:      SpO2: 97% 92% 95% 95%    Intake/Output Summary (Last 24 hours) at 05/28/14 0916 Last data filed at 05/28/14 0831  Gross per 24 hour  Intake 367.17 ml  Output   1255 ml  Net -887.83 ml   Weight change:  Exam:   General:  Pt is alert, follows commands appropriately, not in acute distress  HEENT: No icterus, No thrush,  Delta/AT  Cardiovascular: RRR, S1/S2, no rubs, no gallops  Respiratory: Fine bibasilar crackles. No wheezing. Good air movement  Abdomen: Soft/+BS,  non tender, non distended, no guarding  Extremities: 1+ edema, No lymphangitis, No petechiae, No rashes, no synovitis  Data Reviewed: Basic Metabolic Panel:  Recent Labs Lab 05/23/14 0620 05/24/14 0415 05/25/14 0750 05/26/14 0529 05/27/14 0342 05/28/14 0505  NA 132* 137 142 140 141  --   K 4.0 3.6* 3.7 3.7 3.2* 3.6*  CL 92* 95* 100 98 98  --   CO2 23 30 33* 32 35*  --   GLUCOSE 115* 96 97 84 127*  --   BUN 12 14 10 7  5*  --   CREATININE 0.73 0.74 0.59 0.63 0.71  --   CALCIUM 8.8 9.0 8.8 8.4 8.9  --    Liver Function Tests:  Recent Labs Lab 05/22/14 0445 05/23/14 0620 05/24/14 0415 05/25/14 0750 05/26/14  0529  AST 126* 83* 71* 69* 39*  ALT 115* 110* 102* 95* 69*  ALKPHOS 115 113 114 113 98  BILITOT 4.1* 3.7* 2.5* 1.1 0.8  PROT 8.1 7.1 7.0 7.0 6.8  ALBUMIN 3.3* 2.6* 2.3* 2.3* 2.1*    Recent Labs Lab 05/21/14 1146 05/22/14 0445  LIPASE 19 13   No results found for this basename: AMMONIA,  in the last 168 hours CBC:  Recent Labs Lab 05/21/14 1146  05/24/14 0415 05/25/14 0750 05/26/14 0529 05/27/14 0342 05/28/14 0505  WBC 15.5*  < > 12.3* 9.1 7.5 7.7 8.0  NEUTROABS 12.8*  --   --   --   --   --   --   HGB 15.1  < > 12.1* 11.7* 11.3* 11.1* 11.8*  HCT 47.3  < > 38.4* 38.5* 36.9* 36.0* 38.7*  MCV 84.9  < > 85.5 88.7 87.0 86.7 87.2  PLT 238  < > 196 214 202 216 249  < > = values in this interval not displayed. Cardiac Enzymes: No results found for this basename: CKTOTAL, CKMB, CKMBINDEX, TROPONINI,  in the last 168 hours BNP: No components found with this basename: POCBNP,  CBG:  Recent Labs Lab 05/27/14 0759 05/27/14 1149 05/27/14 1653 05/27/14 2151 05/28/14 0802  GLUCAP 130* 125* 105* 134* 103*    Recent Results (from the past 240 hour(s))  SURGICAL PCR SCREEN     Status: None   Collection Time    05/22/14 10:20 AM      Result Value Ref Range Status   MRSA, PCR NEGATIVE  NEGATIVE Final   Staphylococcus aureus NEGATIVE  NEGATIVE Final    Comment:            The Xpert SA Assay (FDA     approved for NASAL specimens     in patients over 21 years of age),     is one component of     a comprehensive surveillance     program.  Test performance has     been validated by [employer] for patients greater     than or equal to 30 year old.     It is not intended     to diagnose infection nor to     guide or monitor treatment.     Scheduled Meds: . amLODipine  10 mg Oral Daily  . insulin aspart  0-15 Units Subcutaneous TID WC  . metoprolol succinate  25 mg Oral Daily  . pantoprazole (PROTONIX) IV  40 mg Intravenous QHS  . potassium chloride  40 mEq Oral Q4H  . sertraline  100 mg Oral Daily  . torsemide  20 mg Oral Daily  . traZODone  100 mg Oral QHS  . Warfarin - Pharmacist Dosing Inpatient   Does not apply q1800   Continuous Infusions: . heparin 2,600 Units/hr (05/28/14 0136)  . lactated ringers 10 mL/hr at 05/25/14 2218     Thurnell Lose, DO  [facility] Pager [phone]  If 7PM-7AM, please contact night-coverage [email or web address] Password [email or web address] 05/28/2014, 9:16 AM   LOS: 7 days

## 2014-05-28 NOTE — Progress Notes (Signed)
Patient ID: Nicolas [name], male   DOB: [birth year], 48 y.o.   MRN: [id number]  [facility] - Progress Note  POD# 4  Subjective: Patient with mild pain, otherwise no complaints.  Tolerating regular diet.  Objective: Vital signs in last 24 hours: Temp:  [97.8 F (36.6 C)-98.2 F (36.8 C)] 98.2 F (36.8 C) (07/03 0601) Pulse Rate:  [76-81] 76 (07/03 0933) Resp:  [17-18] 18 (07/03 0601) BP: (116-172)/(55-92) 141/67 mmHg (07/03 0933) SpO2:  [92 %-95 %] 95 % (07/03 0601) Last BM Date: 05/26/14  Intake/Output from previous day: 07/02 0701 - 07/03 0700 In: 367.2 [P.O.:240; I.V.:127.2] Out: 1055 [Urine:1050; Drains:5]  Exam: HEENT - clear, not icteric Neck - soft Chest - clear bilaterally Cor - RRR, no murmur Abd - soft, obese; wounds clear and dry and intact; drain with thin serous output Ext - no significant edema Neuro - grossly intact, no focal deficits  Lab Results:   Recent Labs  05/27/14 0342 05/28/14 0505  WBC 7.7 8.0  HGB 11.1* 11.8*  HCT 36.0* 38.7*  PLT 216 249     Recent Labs  05/26/14 0529 05/27/14 0342 05/28/14 0505  NA 140 141  --   K 3.7 3.2* 3.6*  CL 98 98  --   CO2 32 35*  --   GLUCOSE 84 127*  --   BUN 7 5*  --   CREATININE 0.63 0.71  --   CALCIUM 8.4 8.9  --     Studies/Results: No results found.  Assessment / Plan: 1.  Status post lap chole for cholecystitis  Regular diet  Monitor drain output  Anticoagulation ongoing - on heparin drip  Discharge when therapeutic on coumadin  Earnstine Regal, MD, [facility] Office: [phone]  05/28/2014

## 2014-05-29 ENCOUNTER — Inpatient Hospital Stay (HOSPITAL_COMMUNITY): Payer: Medicare HMO

## 2014-05-29 LAB — CBC
HCT: 39.5 % (ref 39.0–52.0)
Hemoglobin: 12.2 g/dL — ABNORMAL LOW (ref 13.0–17.0)
MCH: 26.5 pg (ref 26.0–34.0)
MCHC: 30.9 g/dL (ref 30.0–36.0)
MCV: 85.9 fL (ref 78.0–100.0)
Platelets: 230 10*3/uL (ref 150–400)
RBC: 4.6 MIL/uL (ref 4.22–5.81)
RDW: 17 % — ABNORMAL HIGH (ref 11.5–15.5)
WBC: 8.2 10*3/uL (ref 4.0–10.5)

## 2014-05-29 LAB — MAGNESIUM: Magnesium: 2.1 mg/dL (ref 1.5–2.5)

## 2014-05-29 LAB — GLUCOSE, CAPILLARY
Glucose-Capillary: 128 mg/dL — ABNORMAL HIGH (ref 70–99)
Glucose-Capillary: 163 mg/dL — ABNORMAL HIGH (ref 70–99)
Glucose-Capillary: 162 mg/dL — ABNORMAL HIGH (ref 70–99)
Glucose-Capillary: 167 mg/dL — ABNORMAL HIGH (ref 70–99)

## 2014-05-29 LAB — HEPATIC FUNCTION PANEL
ALT: 37 U/L (ref 0–53)
AST: 28 U/L (ref 0–37)
Albumin: 2.6 g/dL — ABNORMAL LOW (ref 3.5–5.2)
Alkaline Phosphatase: 78 U/L (ref 39–117)
Bilirubin, Direct: <0.2 mg/dL (ref 0.0–0.3)
Total Bilirubin: 0.4 mg/dL (ref 0.3–1.2)
Total Protein: 7.1 g/dL (ref 6.0–8.3)

## 2014-05-29 LAB — POTASSIUM: Potassium: 3.8 mEq/L (ref 3.7–5.3)

## 2014-05-29 LAB — PROTIME-INR
INR: 1.15 (ref 0.00–1.49)
Prothrombin Time: 14.7 seconds (ref 11.6–15.2)

## 2014-05-29 LAB — LIPASE, BLOOD: Lipase: 79 U/L — ABNORMAL HIGH (ref 11–59)

## 2014-05-29 LAB — AMYLASE: Amylase: 38 U/L (ref 0–105)

## 2014-05-29 LAB — HEPARIN LEVEL (UNFRACTIONATED): Heparin Unfractionated: 0.48 IU/mL (ref 0.30–0.70)

## 2014-05-29 MED ORDER — WARFARIN SODIUM 2.5 MG PO TABS
12.5000 mg | ORAL_TABLET | Freq: Once | ORAL | Status: AC
  Administered 2014-05-29: 12.5 mg via ORAL
  Filled 2014-05-29: qty 1

## 2014-05-29 NOTE — Progress Notes (Signed)
ANTICOAGULATION CONSULT NOTE - FOLLOW UP  Pharmacy Consult:  Heparin + Coumadin Indication: DVT + recurrent PE's  Allergies  Allergen Reactions  . Lisinopril Other (See Comments)    cough  . Ace Inhibitors     cough    Patient Measurements: Height: 5\' 11"  (180.3 cm) Weight: 315 lb 4.1 oz (143 kg) IBW/kg (Calculated) : 75.3 Heparin Dosing Weight: 109kg  Vital Signs: Temp: 97.8 F (36.6 C) (07/04 0512) BP: 137/59 mmHg (07/04 0512) Pulse Rate: 74 (07/04 0512)  Labs:  Recent Labs  05/27/14 0342 05/28/14 0505 05/29/14 0408  HGB 11.1* 11.8* 12.2*  HCT 36.0* 38.7* 39.5  PLT 216 249 230  LABPROT 13.8 14.0 14.7  INR 1.06 1.08 1.15  HEPARINUNFRC 0.58 0.67 0.48  CREATININE 0.71  --   --     Estimated Creatinine Clearance: 165.3 ml/min (by C-G formula based on Cr of 0.71).    Assessment: 60 YOM on warfarin PTA (home dose 10mg  daily except 5mg  on MWF) s/p INR reversal with 2 doses of Vitamin K 10mg  IV as well as lap chole and ERCP.  Currently on IV heparin bridge and Coumadin for history of recurrent VTE's.    Heparin level this morning is therapeutic (HL 0.48, goal of 0.3-0.7), INR remains SUBtherapeutic but is starting to trend up (INR 1.15 << 1.08, goal of 2-3). The slow trend in INR is likely d/t recent reversal with Vitamin K. The patient is already receiving 1.5x their home dose which they have been known to be therapeutic on. Will keep at this dose and expect INR to start rising more rapidly in the next couple of days. CBC stable - no bleeding noted.  If discharge plans are hindered in the setting of a SUBtherapeutic INR - can consider transitioning the patient to lovenox to continue bridging at discharge.   Goal of Therapy:  INR 2-3 Heparin level 0.3-0.7 units/ml Monitor platelets by anticoagulation protocol: Yes    Plan:  1. Continue heparin at 2500 units/hr (25 ml/hr) 2. Warfarin 12.5 mg x 1 dose again today 3. Consider transitioning to lovenox if INR is  hindering discharge plans 4. Will continue to monitor for any signs/symptoms of bleeding and will follow up with heparin level and PT/INR in the AM  Alycia Rossetti, PharmD, BCPS Clinical Pharmacist Pager: [phone] 05/29/2014 10:50 AM

## 2014-05-29 NOTE — Progress Notes (Signed)
Patient ID: Nicolas [name], male   DOB: Oct 13, 1966, 48 y.o.   MRN: [id number]  [facility] - Progress Note  POD# 5  Subjective: Patient complains of right sided abdominal and chest pain unrelieved with pain med.  Mild SOB.  Objective: Vital signs in last 24 hours: Temp:  [97.4 F (36.3 C)-98.4 F (36.9 C)] 97.8 F (36.6 C) (07/04 0512) Pulse Rate:  [74-87] 74 (07/04 0512) Resp:  [18-19] 19 (07/04 0512) BP: (131-141)/(59-67) 137/59 mmHg (07/04 0512) SpO2:  [93 %-97 %] 93 % (07/04 0512) Last BM Date: 05/26/14  Intake/Output from previous day: 07/03 0701 - 07/04 0700 In: 1918.2 [P.O.:1140; I.V.:778.2] Out: 1308 [Urine:3600; Drains:35]  Exam: HEENT - clear, not icteric Neck - soft Chest - shallow, clear bilaterally Cor - RRR, no murmur Abd - soft, obese, mild distension; JP with thin serous output; wounds clear and dry Ext - no significant edema Neuro - grossly intact, no focal deficits  Lab Results:   Recent Labs  05/28/14 0505 05/29/14 0408  WBC 8.0 8.2  HGB 11.8* 12.2*  HCT 38.7* 39.5  PLT 249 230     Recent Labs  05/27/14 0342 05/28/14 0505 05/29/14 0408  NA 141  --   --   K 3.2* 3.6* 3.8  CL 98  --   --   CO2 35*  --   --   GLUCOSE 127*  --   --   BUN 5*  --   --   CREATININE 0.71  --   --   CALCIUM 8.9  --   --     Studies/Results: No results found.  Assessment / Plan: 1. Status post lap chole with stent placement  Check LFT's, amylase today  WBC normal  Drain output clear  Rx pain and nausea 2.  SOB  Check portable CXR this AM  O2 sat normal 3.  Anticoagulation  Heparin drip  Coumadin - PT / INR 1/15 this AM - per pharmacy  Earnstine Regal, MD, [facility] Office: [phone]  05/29/2014

## 2014-05-30 LAB — HEPARIN LEVEL (UNFRACTIONATED)
Heparin Unfractionated: 0.24 IU/mL — ABNORMAL LOW (ref 0.30–0.70)
Heparin Unfractionated: 0.22 IU/mL — ABNORMAL LOW (ref 0.30–0.70)
Heparin Unfractionated: 0.25 IU/mL — ABNORMAL LOW (ref 0.30–0.70)

## 2014-05-30 LAB — GLUCOSE, CAPILLARY
Glucose-Capillary: 132 mg/dL — ABNORMAL HIGH (ref 70–99)
Glucose-Capillary: 168 mg/dL — ABNORMAL HIGH (ref 70–99)
Glucose-Capillary: 119 mg/dL — ABNORMAL HIGH (ref 70–99)
Glucose-Capillary: 105 mg/dL — ABNORMAL HIGH (ref 70–99)

## 2014-05-30 LAB — CBC
HCT: 41.3 % (ref 39.0–52.0)
Hemoglobin: 13.3 g/dL (ref 13.0–17.0)
MCH: 27 pg (ref 26.0–34.0)
MCHC: 32.2 g/dL (ref 30.0–36.0)
MCV: 83.9 fL (ref 78.0–100.0)
Platelets: 240 10*3/uL (ref 150–400)
RBC: 4.92 MIL/uL (ref 4.22–5.81)
RDW: 17.2 % — ABNORMAL HIGH (ref 11.5–15.5)
WBC: 18.8 10*3/uL — ABNORMAL HIGH (ref 4.0–10.5)

## 2014-05-30 LAB — PROTIME-INR
INR: 1.39 (ref 0.00–1.49)
Prothrombin Time: 17.1 seconds — ABNORMAL HIGH (ref 11.6–15.2)

## 2014-05-30 MED ORDER — WARFARIN SODIUM 2.5 MG PO TABS
12.5000 mg | ORAL_TABLET | Freq: Once | ORAL | Status: AC
  Administered 2014-05-30: 12.5 mg via ORAL
  Filled 2014-05-30: qty 1

## 2014-05-30 MED ORDER — HEPARIN (PORCINE) IN NACL 100-0.45 UNIT/ML-% IJ SOLN
3600.0000 [IU]/h | INTRAMUSCULAR | Status: DC
  Administered 2014-05-30: 3000 [IU]/h via INTRAVENOUS
  Administered 2014-05-31 (×2): 3600 [IU]/h via INTRAVENOUS
  Administered 2014-05-31: 3300 [IU]/h via INTRAVENOUS
  Administered 2014-06-01: 3600 [IU]/h via INTRAVENOUS
  Filled 2014-05-30 (×6): qty 250

## 2014-05-30 MED ORDER — HEPARIN BOLUS VIA INFUSION
1500.0000 [IU] | Freq: Once | INTRAVENOUS | Status: AC
  Administered 2014-05-30: 1500 [IU] via INTRAVENOUS
  Filled 2014-05-30: qty 1500

## 2014-05-30 MED ORDER — POLYETHYLENE GLYCOL 3350 17 G PO PACK
17.0000 g | PACK | Freq: Every day | ORAL | Status: DC
  Administered 2014-05-30 – 2014-05-31 (×2): 17 g via ORAL
  Filled 2014-05-30 (×3): qty 1

## 2014-05-30 NOTE — Progress Notes (Signed)
Patient ID: Nicolas [name], male   DOB: Mar 24, 1966, 48 y.o.   MRN: [id number]  [facility] - Progress Note  POD# 6  Subjective: Patient better - right sided pain "nothing like yesterday".  Tolerating diet.  Objective: Vital signs in last 24 hours: Temp:  [98.3 F (36.8 C)-99 F (37.2 C)] 98.3 F (36.8 C) (07/05 0544) Pulse Rate:  [96-117] 96 (07/05 0544) Resp:  [18-20] 19 (07/05 0544) BP: (129-146)/(66-77) 132/66 mmHg (07/05 0544) SpO2:  [93 %-95 %] 94 % (07/05 0544) Last BM Date: 05/26/14  Intake/Output from previous day: 07/04 0701 - 07/05 0700 In: 840 [P.O.:840] Out: 1415 [Urine:1400; Drains:15]  Exam: HEENT - clear, not icteric Neck - soft Chest - shallow bilaterally Cor - RRR, no murmur Abd - soft, obese; mild RLQ tenderness; drain with serosanguinous - small; wounds clear and dry and intact; BS present Ext - no significant edema Neuro - grossly intact, no focal deficits  Lab Results:   Recent Labs  05/29/14 0408 05/30/14 0340  WBC 8.2 18.8*  HGB 12.2* 13.3  HCT 39.5 41.3  PLT 230 240     Recent Labs  05/28/14 0505 05/29/14 0408  K 3.6* 3.8    Studies/Results: Dg Chest Port 1 View  05/29/2014   CLINICAL DATA:  Right chest and abdomen pain and shortness of breath 5 days following laparoscopic cholecystectomy.  EXAM: PORTABLE CHEST - 1 VIEW  COMPARISON:  05/22/2014.  FINDINGS: Poor inspiration. Decreased linear density and pleural fluid at the right lung base. Clear left lung. Stable mildly enlarged cardiac silhouette. Stable cervical spine fixation hardware.  IMPRESSION: Decreased right basilar atelectasis and pleural fluid.   Electronically Signed   By: Enrique Sack M.D.   On: 05/29/2014 10:15    Assessment / Plan: 1. Status post lap chole with stent placement   LFT's, amylase normal; lipase mildly up - from stent?  WBC elevated this AM - source? - repeat in AM  Drain output - small   Rx pain and nausea  2. SOB    CXR with low volumes  Encouarged IS use  3. Anticoagulation   Heparin drip   Coumadin - PT / INR this AM - per pharmacy 4. Constipation  Patient request Miralax  Earnstine Regal, MD, [facility] Office: [phone]  05/30/2014

## 2014-05-30 NOTE — Progress Notes (Signed)
ANTICOAGULATION CONSULT NOTE - FOLLOW UP  Pharmacy Consult:  Heparin + Coumadin Indication: DVT + recurrent PE's  Allergies  Allergen Reactions  . Lisinopril Other (See Comments)    cough  . Ace Inhibitors     cough    Patient Measurements: Height: 5\' 11"  (180.3 cm) Weight: 315 lb 4.1 oz (143 kg) IBW/kg (Calculated) : 75.3 Heparin Dosing Weight: 109kg  Vital Signs: Temp: 98.3 F (36.8 C) (07/05 0544) BP: 143/70 mmHg (07/05 0922) Pulse Rate: 100 (07/05 0922)  Labs:  Recent Labs  05/28/14 0505 05/29/14 0408 05/30/14 0340 05/30/14 1055  HGB 11.8* 12.2* 13.3  --   HCT 38.7* 39.5 41.3  --   PLT 249 230 240  --   LABPROT 14.0 14.7 17.1*  --   INR 1.08 1.15 1.39  --   HEPARINUNFRC 0.67 0.48 0.24* 0.22*    Estimated Creatinine Clearance: 165.3 ml/min (by C-G formula based on Cr of 0.71).    Assessment: 75 YOM on warfarin PTA (home dose 10mg  daily except 5mg  on MWF) s/p INR reversal with 2 doses of Vitamin K 10mg  IV as well as lap chole and ERCP.  Currently on IV heparin bridge and Coumadin for history of recurrent VTE's.    Heparin level this morning remains SUBtherapeutic after a rate adjustment earlier today (HL 0.22 << 0.24, goal of 0.3-0.7), INR remains SUBtherapeutic but trending up (INR 1.39 << 1.15, goal of 2-3). The slow trend in INR is likely d/t recent reversal with Vitamin K. The patient is already receiving 1.5x their home dose which they have been known to be therapeutic on. Will keep at this dose for now - the patient's INR should start rising more rapidly in the next couple of days. CBC stable - no bleeding noted.  If discharge plans are hindered in the setting of a SUBtherapeutic INR - can consider transitioning the patient to lovenox to continue bridging at discharge.   Goal of Therapy:  INR 2-3 Heparin level 0.3-0.7 units/ml Monitor platelets by anticoagulation protocol: Yes    Plan:  1. Increase heparin to 2800 units/hr (28 ml/hr) 2. Warfarin  12.5 mg x 1 dose again today 3. Consider transitioning to lovenox if INR is hindering discharge plans 4. Will continue to monitor for any signs/symptoms of bleeding and will follow up with heparin level in 6 hours and PT/INR in the AM  Alycia Rossetti, PharmD, BCPS Clinical Pharmacist Pager: [phone] 05/30/2014 12:02 PM

## 2014-05-30 NOTE — Progress Notes (Signed)
ANTICOAGULATION CONSULT NOTE - Follow Up Consult  Pharmacy Consult for heparin Indication: pulmonary embolus and DVT  Labs:  Recent Labs  05/28/14 0505 05/29/14 0408 05/30/14 0340  HGB 11.8* 12.2* 13.3  HCT 38.7* 39.5 41.3  PLT 249 230 240  LABPROT 14.0 14.7 17.1*  INR 1.08 1.15 1.39  HEPARINUNFRC 0.67 0.48 0.24*    Assessment: 48yo male now subtherapeutic on heparin after rate change for increasing levels at previously stable therapeutic rate.  Goal of Therapy:  Heparin level 0.3-0.7 units/ml   Plan:  Will increase heparin gtt back to 2600 units/hr and check level in [place], PharmD, BCPS  05/30/2014,5:09 AM

## 2014-05-30 NOTE — Progress Notes (Addendum)
ANTICOAGULATION CONSULT NOTE - FOLLOW UP  Pharmacy Consult:  Heparin + Coumadin Indication: DVT + recurrent PE's  Allergies  Allergen Reactions  . Lisinopril Other (See Comments)    cough  . Ace Inhibitors     cough    Patient Measurements: Height: 5\' 11"  (180.3 cm) Weight: 315 lb 4.1 oz (143 kg) IBW/kg (Calculated) : 75.3 Heparin Dosing Weight: 109kg  Vital Signs: Temp: 98 F (36.7 C) (07/05 1500) Temp src: Oral (07/05 1500) BP: 114/60 mmHg (07/05 1500) Pulse Rate: 91 (07/05 1500)  Labs:  Recent Labs  05/28/14 0505 05/29/14 0408 05/30/14 0340 05/30/14 1055 05/30/14 1758  HGB 11.8* 12.2* 13.3  --   --   HCT 38.7* 39.5 41.3  --   --   PLT 249 230 240  --   --   LABPROT 14.0 14.7 17.1*  --   --   INR 1.08 1.15 1.39  --   --   HEPARINUNFRC 0.67 0.48 0.24* 0.22* 0.25*    Estimated Creatinine Clearance: 165.3 ml/min (by C-G formula based on Cr of 0.71).   Assessment: Heparin level tonight is 0.25, remains subtherapeutic after heparin rate increased to 2800 units/hr in this 48 y.o male on IV heparin bridge and Coumadin for history of recurrent VTE's. No bleeding noted.  No complication with IV site per RN.   69 YOM on warfarin PTA (home dose 10mg  daily except 5mg  on MWF) s/p INR reversal with 2 doses of Vitamin K 10mg  IV as well as lap chole and ERCP.    If discharge plans are hindered in the setting of a SUBtherapeutic INR - can consider transitioning the patient to lovenox to continue bridging at discharge.   Goal of Therapy:  INR 2-3 Heparin level 0.3-0.7 units/ml Monitor platelets by anticoagulation protocol: Yes    Plan:  Bolus heparin 1500 units x1 Increase heparin to 3000 units/hr (30 ml/hr) Check 6 hour heparin level.  Consider transitioning to lovenox if INR is hindering discharge plans Will continue to monitor for any signs/symptoms of bleeding and will follow up with heparin level in 6 hours and PT/INR in the AM See earlier note today regarding  coumadin.   Nicole Cella, RPh Clinical Pharmacist Pager: [phone] 05/30/2014 7:47 PM

## 2014-05-31 ENCOUNTER — Inpatient Hospital Stay (HOSPITAL_COMMUNITY): Payer: Medicare HMO

## 2014-05-31 ENCOUNTER — Encounter (HOSPITAL_COMMUNITY): Payer: Self-pay | Admitting: Radiology

## 2014-05-31 DIAGNOSIS — K5900 Constipation, unspecified: Secondary | ICD-10-CM

## 2014-05-31 DIAGNOSIS — I2699 Other pulmonary embolism without acute cor pulmonale: Secondary | ICD-10-CM

## 2014-05-31 LAB — CBC WITH DIFFERENTIAL/PLATELET
Basophils Absolute: 0 10*3/uL (ref 0.0–0.1)
Basophils Relative: 0 % (ref 0–1)
Eosinophils Absolute: 0.1 10*3/uL (ref 0.0–0.7)
Eosinophils Relative: 1 % (ref 0–5)
HCT: 38.5 % — ABNORMAL LOW (ref 39.0–52.0)
Hemoglobin: 12.5 g/dL — ABNORMAL LOW (ref 13.0–17.0)
Lymphocytes Relative: 9 % — ABNORMAL LOW (ref 12–46)
Lymphs Abs: 1.6 10*3/uL (ref 0.7–4.0)
MCH: 27.1 pg (ref 26.0–34.0)
MCHC: 32.5 g/dL (ref 30.0–36.0)
MCV: 83.5 fL (ref 78.0–100.0)
Monocytes Absolute: 1.1 10*3/uL — ABNORMAL HIGH (ref 0.1–1.0)
Monocytes Relative: 6 % (ref 3–12)
Neutro Abs: 14.4 10*3/uL — ABNORMAL HIGH (ref 1.7–7.7)
Neutrophils Relative %: 84 % — ABNORMAL HIGH (ref 43–77)
Platelets: 243 10*3/uL (ref 150–400)
RBC: 4.61 MIL/uL (ref 4.22–5.81)
RDW: 17.6 % — ABNORMAL HIGH (ref 11.5–15.5)
WBC: 17.2 10*3/uL — ABNORMAL HIGH (ref 4.0–10.5)

## 2014-05-31 LAB — HEPATIC FUNCTION PANEL
ALT: 20 U/L (ref 0–53)
AST: 17 U/L (ref 0–37)
Albumin: 2.4 g/dL — ABNORMAL LOW (ref 3.5–5.2)
Alkaline Phosphatase: 80 U/L (ref 39–117)
Bilirubin, Direct: <0.2 mg/dL (ref 0.0–0.3)
Total Bilirubin: 0.5 mg/dL (ref 0.3–1.2)
Total Protein: 7.2 g/dL (ref 6.0–8.3)

## 2014-05-31 LAB — HEPARIN LEVEL (UNFRACTIONATED)
Heparin Unfractionated: 0.26 IU/mL — ABNORMAL LOW (ref 0.30–0.70)
Heparin Unfractionated: 0.25 IU/mL — ABNORMAL LOW (ref 0.30–0.70)
Heparin Unfractionated: 0.61 IU/mL (ref 0.30–0.70)

## 2014-05-31 LAB — GLUCOSE, CAPILLARY
Glucose-Capillary: 98 mg/dL (ref 70–99)
Glucose-Capillary: 133 mg/dL — ABNORMAL HIGH (ref 70–99)
Glucose-Capillary: 104 mg/dL — ABNORMAL HIGH (ref 70–99)
Glucose-Capillary: 104 mg/dL — ABNORMAL HIGH (ref 70–99)

## 2014-05-31 LAB — PROTIME-INR
INR: 1.76 — ABNORMAL HIGH (ref 0.00–1.49)
Prothrombin Time: 20.5 seconds — ABNORMAL HIGH (ref 11.6–15.2)

## 2014-05-31 MED ORDER — DOCUSATE SODIUM 100 MG PO CAPS
100.0000 mg | ORAL_CAPSULE | Freq: Two times a day (BID) | ORAL | Status: DC
  Administered 2014-05-31 – 2014-06-01 (×2): 100 mg via ORAL
  Filled 2014-05-31 (×2): qty 1

## 2014-05-31 MED ORDER — WARFARIN SODIUM 2.5 MG PO TABS
12.5000 mg | ORAL_TABLET | Freq: Once | ORAL | Status: AC
  Administered 2014-05-31: 12.5 mg via ORAL
  Filled 2014-05-31 (×2): qty 1

## 2014-05-31 MED ORDER — IOHEXOL 300 MG/ML  SOLN
25.0000 mL | INTRAMUSCULAR | Status: AC
  Administered 2014-05-31 (×2): 25 mL via ORAL

## 2014-05-31 MED ORDER — IOHEXOL 300 MG/ML  SOLN
100.0000 mL | Freq: Once | INTRAMUSCULAR | Status: AC | PRN
Start: 2014-05-31 — End: 2014-05-31
  Administered 2014-05-31: 100 mL via INTRAVENOUS

## 2014-05-31 MED ORDER — BISACODYL 10 MG RE SUPP: 10.0000 mg | Freq: Every day | RECTAL | Status: DC | PRN

## 2014-05-31 NOTE — Progress Notes (Signed)
ANTICOAGULATION CONSULT NOTE - Follow Up Consult  Pharmacy Consult for heparin Indication: pulmonary embolus and DVT  Labs:  Recent Labs  05/29/14 0408 05/30/14 0340 05/30/14 1055 05/30/14 1758 05/31/14 0222  HGB 12.2* 13.3  --   --  12.5*  HCT 39.5 41.3  --   --  38.5*  PLT 230 240  --   --  243  LABPROT 14.7 17.1*  --   --  20.5*  INR 1.15 1.39  --   --  1.76*  HEPARINUNFRC 0.48 0.24* 0.22* 0.25* 0.26*    Assessment: 48yo male remains subtherapeutic on heparin with very little change in level with each rate adjustment.  Goal of Therapy:  Heparin level 0.3-0.7 units/ml   Plan:  Will increase heparin gtt by 10% to 3300 units/hr and check level in 6hr.  Wynona Neat, PharmD, BCPS  05/31/2014,3:41 AM

## 2014-05-31 NOTE — Progress Notes (Signed)
Seen together and agree with plan. I tried to flush the JP with injectable saline but was unable to clear it. Will check CT A/P. May need IR to place new drain if significant collection is present. Georganna Skeans, MD, MPH, FACS Trauma: [phone] General Surgery: [phone]

## 2014-05-31 NOTE — Progress Notes (Signed)
ANTICOAGULATION CONSULT NOTE - Follow Up Consult  Pharmacy Consult for Heparin/Coumadin Indication: h/o recurrent PE and DVT   Allergies  Allergen Reactions  . Lisinopril Other (See Comments)    cough  . Ace Inhibitors     cough    Patient Measurements: Height: 5\' 11"  (180.3 cm) Weight: 315 lb 4.1 oz (143 kg) IBW/kg (Calculated) : 75.3 Heparin Dosing Weight: 109 kg  Vital Signs: Temp: 97.7 F (36.5 C) (07/06 1328) Temp src: Oral (07/06 1328) BP: 129/69 mmHg (07/06 1328) Pulse Rate: 86 (07/06 1328)  Labs:  Recent Labs  05/29/14 0408 05/30/14 0340  05/31/14 0222 05/31/14 1044 05/31/14 1910  HGB 12.2* 13.3  --  12.5*  --   --   HCT 39.5 41.3  --  38.5*  --   --   PLT 230 240  --  243  --   --   LABPROT 14.7 17.1*  --  20.5*  --   --   INR 1.15 1.39  --  1.76*  --   --   HEPARINUNFRC 0.48 0.24*  < > 0.26* 0.25* 0.61  < > = values in this interval not displayed.  Estimated Creatinine Clearance: 165.3 ml/min (by C-G formula based on Cr of 0.71).    Assessment: 78 YOM on warfarin PTA (home dose 10mg  daily except 5mg  on MWF) s/p INR reversal with 2 doses of Vitamin K 10mg  IV as well as lap chole and ERCP. Currently on IV heparin bridge and Coumadin for history of recurrent VTE's.   Anticoagulation: h/o mult VTEs. Heparin level jumped from 0.25 to 0.61 after increase of 3 units/kg/hr.  INR up to 1.76 after overcoming the IV Vitamin K. CBC stable. No bleeding noted.   Goal of Therapy:  Heparin level 0.3-0.7 units/ml INR 2-3 Monitor platelets by anticoagulation protocol: Yes   Plan:  Continue heparin at 3600 units/hr. Recheck heparin level in 6 hours to confirm due to large increase in heparin level.  Daily heparin level and CBC.  Sloan Leiter, PharmD, BCPS Clinical Pharmacist [phone] 05/31/2014,8:08 PM

## 2014-05-31 NOTE — Progress Notes (Signed)
[facility] Progress Note  6 Days Post-Op  Subjective: Doing well, pain over drain site.  No N/V, rest of abdomen without pain.  Tolerating HH diet.  Last BM on 05/26/14.  Objective: Vital signs in last 24 hours: Temp:  [98 F (36.7 C)-98.3 F (36.8 C)] 98.2 F (36.8 C) (07/06 0500) Pulse Rate:  [89-95] 89 (07/06 0938) Resp:  [18-22] 18 (07/06 0500) BP: (114-131)/(60-68) 125/68 mmHg (07/06 0940) SpO2:  [93 %-96 %] 93 % (07/06 0500) Last BM Date: 05/26/14  Intake/Output from previous day: 07/05 0701 - 07/06 0700 In: 842.3 [P.O.:480; I.V.:362.3] Out: 2825 [Urine:2825] Intake/Output this shift: Total I/O In: -  Out: 600 [Urine:600]  PE: Gen:  Alert, NAD, pleasant Abd: Soft, obese, mildly distended, quite tender around JP drain, purulent drainage in the JP drain tube which is clogged, attempt 63mL flush without success, was able to free up some of the drainage with syringe suction, purulent foul smelling drainage leaking around the JP drain, +BS, no HSM, incisions C/D/I   Lab Results:   Recent Labs  05/30/14 0340 05/31/14 0222  WBC 18.8* 17.2*  HGB 13.3 12.5*  HCT 41.3 38.5*  PLT 240 243   BMET  Recent Labs  05/29/14 0408  K 3.8   PT/INR  Recent Labs  05/30/14 0340 05/31/14 0222  LABPROT 17.1* 20.5*  INR 1.39 1.76*   CMP     Component Value Date/Time   NA 141 05/27/2014 0342   K 3.8 05/29/2014 0408   CL 98 05/27/2014 0342   CO2 35* 05/27/2014 0342   GLUCOSE 127* 05/27/2014 0342   BUN 5* 05/27/2014 0342   CREATININE 0.71 05/27/2014 0342   CREATININE 0.97 05/01/2013 1545   CALCIUM 8.9 05/27/2014 0342   PROT 7.1 05/29/2014 0408   ALBUMIN 2.6* 05/29/2014 0408   AST 28 05/29/2014 0408   ALT 37 05/29/2014 0408   ALKPHOS 78 05/29/2014 0408   BILITOT 0.4 05/29/2014 0408   GFRNONAA >90 05/27/2014 0342   GFRAA >90 05/27/2014 0342   Lipase     Component Value Date/Time   LIPASE 79* 05/29/2014 0408       Studies/Results: No results  found.  Anti-infectives: Anti-infectives   Start     Dose/Rate Route Frequency Ordered Stop   05/21/14 2200  piperacillin-tazobactam (ZOSYN) IVPB 3.375 g  Status:  Discontinued     3.375 g 12.5 mL/hr over 240 Minutes Intravenous 3 times per day 05/21/14 2054 05/27/14 0759       Assessment/Plan 1. POD #6 S/P lap chole with bile leak (Dr. Grandville Silos - 05/24/14) POD #5 ERCP stent placement (Dr. Benson Norway 05/25/14) -LFT's, amylase normal; lipase mildly up - from stent?  -WBC elevated this AM - source? - repeat in AM  -Drain output - small, JP drain to remain in place -Rx pain and nausea  2. SOB  -CXR with low volumes  -Encouarged IS use  3. Anticoagulation for recurrent PE  -Heparin drip  -Coumadin - PT / INR this AM - per pharmacy  -D/c Heparin when INR is 2-3 and then he can d/c on his home dose coumadin 4. Constipation  -Patient request Miralax -Added colace and dulcolax 5. Leukocytosis - 18.8 yesterday, now 17.2, ? abdominal abscess 6. Purulent drainage from JP drain -Attempted to flush drain without result, recheck LFT's, Repeat CT scan with contrast last one on 05/21/14 -May need IR to place another drain if abscess since this drain is not functioning properly -Switch to clears for CT scan  LOS: 10 days    Coralie Keens 05/31/2014, 10:21 AM Pager: [phone]

## 2014-05-31 NOTE — Progress Notes (Signed)
ANTICOAGULATION CONSULT NOTE - Follow Up Consult  Pharmacy Consult for Heparin/Coumadin Indication: h/o recurrent PE and DVT   Allergies  Allergen Reactions  . Lisinopril Other (See Comments)    cough  . Ace Inhibitors     cough    Patient Measurements: Height: 5\' 11"  (180.3 cm) Weight: 315 lb 4.1 oz (143 kg) IBW/kg (Calculated) : 75.3 Heparin Dosing Weight: 109 kg  Vital Signs: Temp: 98.2 F (36.8 C) (07/06 0500) Temp src: Oral (07/06 0500) BP: 125/68 mmHg (07/06 0940) Pulse Rate: 89 (07/06 0938)  Labs:  Recent Labs  05/29/14 0408 05/30/14 0340  05/30/14 1758 05/31/14 0222 05/31/14 1044  HGB 12.2* 13.3  --   --  12.5*  --   HCT 39.5 41.3  --   --  38.5*  --   PLT 230 240  --   --  243  --   LABPROT 14.7 17.1*  --   --  20.5*  --   INR 1.15 1.39  --   --  1.76*  --   HEPARINUNFRC 0.48 0.24*  < > 0.25* 0.26* 0.25*  < > = values in this interval not displayed.  Estimated Creatinine Clearance: 165.3 ml/min (by C-G formula based on Cr of 0.71).    Assessment:Acute acalculous cholecystitis s/p ERCP 6/30  48 YOM on warfarin PTA (home dose 10mg  daily except 5mg  on MWF) s/p INR reversal with 2 doses of Vitamin K 10mg  IV as well as lap chole and ERCP. Currently on IV heparin bridge and Coumadin for history of recurrent VTE's.   Anticoagulation: h/o mult VTEs. Heparin level 0.25 remains less than goal. INR up to 1.76 after overcoming the IV Vitamin K. CBC stable.  Infectious Disease: s/p Zosyn 6/26>>7/2, afeb, WBC up to 17.2.  Cardiovascular: HTN -VSS On norvasc, toprol, torsemide   Endocrinology: DM - CBGs controlled on SSI, 98-168  Gastrointestinal / Nutrition: heart diet - PPI po, miralax  Neurology: depression / spinal stenosis (back surgery x 6) >> mild pain per MD. On Zoloft, trazodone  Nephrology: SCr stable, K+ 3.8   Pulmonary: OSA - stable on RA  Hematology / Oncology: CBC stable  Best Practices: PPI, hep + warf, home meds addressed   Goal of  Therapy:  Heparin level 0.3-0.7 units/ml INR 2-3 Monitor platelets by anticoagulation protocol: Yes   Plan:  Increase IV heparin to 3600 units/hr Recheck heparin level in 6 hrs. Coumadin 12.5mg  po x 1 again this PM.   Rajanee Schuelke S. Alford Highland, PharmD, BCPS Clinical Staff Pharmacist Pager [place], [place] 05/31/2014,11:19 AM

## 2014-06-01 ENCOUNTER — Other Ambulatory Visit: Payer: Self-pay | Admitting: Family Medicine

## 2014-06-01 LAB — PROTIME-INR
INR: 1.92 — ABNORMAL HIGH (ref 0.00–1.49)
Prothrombin Time: 22 seconds — ABNORMAL HIGH (ref 11.6–15.2)

## 2014-06-01 LAB — HEPARIN LEVEL (UNFRACTIONATED): Heparin Unfractionated: 0.48 IU/mL (ref 0.30–0.70)

## 2014-06-01 LAB — CBC
HCT: 36.6 % — ABNORMAL LOW (ref 39.0–52.0)
Hemoglobin: 11.8 g/dL — ABNORMAL LOW (ref 13.0–17.0)
MCH: 27.1 pg (ref 26.0–34.0)
MCHC: 32.2 g/dL (ref 30.0–36.0)
MCV: 84.1 fL (ref 78.0–100.0)
Platelets: 261 10*3/uL (ref 150–400)
RBC: 4.35 MIL/uL (ref 4.22–5.81)
RDW: 17.2 % — ABNORMAL HIGH (ref 11.5–15.5)
WBC: 13.1 10*3/uL — ABNORMAL HIGH (ref 4.0–10.5)

## 2014-06-01 LAB — GLUCOSE, CAPILLARY
Glucose-Capillary: 114 mg/dL — ABNORMAL HIGH (ref 70–99)
Glucose-Capillary: 171 mg/dL — ABNORMAL HIGH (ref 70–99)

## 2014-06-01 MED ORDER — ENOXAPARIN SODIUM 150 MG/ML ~~LOC~~ SOLN: 150.0000 mg | Freq: Two times a day (BID) | SUBCUTANEOUS | Status: DC

## 2014-06-01 MED ORDER — POLYETHYLENE GLYCOL 3350 17 G PO PACK: 17.0000 g | PACK | Freq: Every day | ORAL | Status: DC | PRN

## 2014-06-01 MED ORDER — ENOXAPARIN (LOVENOX) PATIENT EDUCATION KIT: PACK | Freq: Once | Status: DC

## 2014-06-01 MED ORDER — DSS 100 MG PO CAPS: 100.0000 mg | ORAL_CAPSULE | Freq: Two times a day (BID) | ORAL | Status: DC | PRN

## 2014-06-01 MED ORDER — WARFARIN SODIUM 2.5 MG PO TABS
12.5000 mg | ORAL_TABLET | Freq: Once | ORAL | Status: AC
  Administered 2014-06-01: 12.5 mg via ORAL
  Filled 2014-06-01: qty 1

## 2014-06-01 MED ORDER — ENOXAPARIN SODIUM 150 MG/ML ~~LOC~~ SOLN
150.0000 mg | Freq: Two times a day (BID) | SUBCUTANEOUS | Status: AC
  Administered 2014-06-01: 150 mg via SUBCUTANEOUS
  Filled 2014-06-01: qty 1

## 2014-06-01 MED ORDER — OXYCODONE-ACETAMINOPHEN 5-325 MG PO TABS: 1.0000 | ORAL_TABLET | ORAL | Status: DC | PRN

## 2014-06-01 MED ORDER — AMOXICILLIN-POT CLAVULANATE 875-125 MG PO TABS: 1.0000 | ORAL_TABLET | Freq: Two times a day (BID) | ORAL | Status: DC

## 2014-06-01 MED ORDER — WARFARIN SODIUM 2.5 MG PO TABS
12.5000 mg | ORAL_TABLET | Freq: Once | ORAL | Status: DC
  Filled 2014-06-01: qty 1

## 2014-06-01 NOTE — Progress Notes (Signed)
Patient for discharge . Patient baseline is that patient is wheelchair bound, he uses a motorized wheelchair and stated that he will go home with his motorized wheelchair. Md made aware and stated that that is patient baseline and he will be fine. SW called and asked for resources to help patient transport, SW found a company but patient refused to pay services , patient stated he will be okey. Daughter called but to no avail, significant others called as well but not willing to help patient.

## 2014-06-01 NOTE — Discharge Summary (Signed)
[facility] Discharge Summary   Patient ID: Nicolas [name] MRN: [id number] DOB/AGE: 1965-12-10 48 y.o.  Admit date: 05/21/2014 Discharge date: 06/01/2014  Admitting Diagnosis: Acute necrotic cholecystitis Cholelithiasis   Discharge Diagnosis Patient Active Problem List   Diagnosis Date Noted  . Hypoxemia 05/23/2014  . Acute acalculous cholecystitis 05/21/2014  . Chronic anticoagulation 05/21/2014  . Spinal stenosis 12/02/2013  . Type II or unspecified type diabetes mellitus without mention of complication, not stated as uncontrolled 08/03/2013  . Venous stasis dermatitis 09/26/2012  . Encounter for long-term (current) use of anticoagulants 03/26/2012  . Acute venous embolism and thrombosis of deep vessels of proximal lower extremity 03/26/2012  . Spinal stenosis of lumbar region 03/06/2012  . HTN (hypertension) 03/06/2012  . Hx pulmonary embolism 03/06/2012  . Morbid obesity 03/06/2012  . Lower extremity edema 03/06/2012    Consultants Dr. Benson Norway (GI) Dr. Candiss Norse (Internal Medicine)  Imaging: Ct Abdomen Pelvis W Contrast  05/31/2014   CLINICAL DATA:  Purulent wound drainage after cholecystectomy.  EXAM: CT ABDOMEN AND PELVIS WITH CONTRAST  TECHNIQUE: Multidetector CT imaging of the abdomen and pelvis was performed using the standard protocol following bolus administration of intravenous contrast.  CONTRAST:  139mL OMNIPAQUE IOHEXOL 300 MG/ML  SOLN  COMPARISON:  05/21/2014 CT  FINDINGS: Trace bilateral pleural effusions are noted with adjacent curvilinear atelectasis or scarring again noted.  Anterior abdominal wall vascular collaterals are reidentified. Right upper quadrant/ gallbladder fossa percutaneous drain in place without measurable fluid collection identified. Minimal stranding is noted in the gallbladder fossa and presumed recent surgical site. 0.8 cm epicardial node is stable. Presumed biliary stent in place without intrahepatic ductal dilatation. Pancreatic duct  is normal in caliber. Trace inferior sub hepatic fluid is identified, measuring low fluid density, for example measuring 5 cm in maximal length image 53. IVC filter in place. Kidneys, adrenal glands, spleen, and pancreas are unremarkable with the exception of a stable 2.2 cm left lower renal pole cyst. No free air.  Mild subcutaneous edema is present. Fat containing umbilical hernia noted with stranding presumably postoperative and no drainable fluid collection. The appendix is normal. No bowel wall thickening or focal segmental dilatation. Normal appearing bladder. No acute osseous abnormality. Evidence of previous multilevel thoracic and lumbar laminectomies.  IMPRESSION: Postoperative change status post cholecystectomy with percutaneous drain and biliary drain in place but no drainable fluid collection or specific gallbladder fossa collection. Trace subhepatic ascites.   Electronically Signed   By: Conchita Paris M.D.   On: 05/31/2014 19:06    Procedures Dr. Grandville Silos (05/24/14) - Laparoscopic Cholecystectomy with [place] Hospital Course:  48 yo male with morbid obesity, DM2, hx of PE, that presents with a 3 day history of mid-abdominal discomfort, abdominal bloating, nausea, vomiting. He has not had a bowel movement in 3 days. His blood sugars have reportedly been fluctuating wildly over the last couple of days. His pain is now localized to the RUQ. No previous abdominal surgery.  Workup showed acute cholecystitis (?acalculous) and leukocytosis.  His INR was reversed with Vit K and 2units FFP.  Patient was admitted while his INR normalized.  He underwent procedure listed above on 05/24/14.  Tolerated procedure well and was transferred to the floor.  His gallbladder was noted to be gangrenous cholecystitis with cholelithiasis.  A JP drain was left in the liver bed for potential bile leak.  Dr. Benson Norway completed a ERCP and stent placement for likely bile leak.  He experienced a post-operative ileus, but soon  diet was advanced as tolerated.  He was resumed coumadin and was bridged with heparin for a few days until his INR became therapeutic.    On POD #7 his INR was found to be 1.92.  After discussing with pharmacy.  We will give his coumadin dosage of 12.5mg  and a dose of lovenox (150mg ) prior to discharge and stop his heparin drip.  He will go home on his regular coumadin dosage 5mg  M/W/F, and 10mg  Tu/Th/Sa/Su as he is expected to be therapeutic by tomorrow.  He will have his INR/PT rechecked with [facility] on Thursday or Friday.  On HD #5-6 his WBC rose to 17-18.  We repeated a CT scan due to purulent drainage being noted from the JP and drainage around the JP drain at the skin.  It showed no remaining fluid collection at the site of the drain.  No other intraabdominal infections.  His WBC count significantly improved from 17.2 to 13.1.  He was afebrile with normal vital signs.  His pain improved at the JP drain site.  His JP drain was removed prior to discharge since there was no remaining fluid collection in the liver bed, and he was instructed on wound care.    On POD#7/HD #11, the patient was voiding well, tolerating diet, ambulating well, pain well controlled, vital signs stable, incisions c/d/i and felt stable for discharge home.  Patient will follow up in our office in 2-3 weeks and knows to call with questions or concerns.  He will continue 1 additional week of antibiotics to help heal the infection around the JP drain site.  He will need to follow up with Dr. Benson Norway in 34 weeks for stent removal.  He will arrange for follow up and INR/coumadin monitoring as he was doing this prior to admission.     Physical Exam: General:  Alert, NAD, pleasant, comfortable Abd:  Soft, ND, mild tenderness at drain site, JP drain site significantly less erythematous, less purulent drainage, incisions C/D/I, drain with minimal seropurulent drainage     Medication List         amoxicillin-clavulanate  875-125 MG per tablet  Commonly known as:  AUGMENTIN  Take 1 tablet by mouth 2 (two) times daily.     clotrimazole-betamethasone cream  Commonly known as:  LOTRISONE  Apply 1 application topically 2 (two) times daily.     diazepam 10 MG tablet  Commonly known as:  VALIUM  Take 1 tablet by mouth every 8 hours if needed for anxiety     DSS 100 MG Caps  Take 100 mg by mouth 2 (two) times daily as needed for mild constipation.     enoxaparin 150 MG/ML injection  Commonly known as:  LOVENOX  Inject 1 mL (150 mg total) into the skin every 12 (twelve) hours.     ketoconazole 2 % shampoo  Commonly known as:  NIZORAL  Apply 1 application topically 2 (two) times a week.     metFORMIN 500 MG tablet  Commonly known as:  GLUCOPHAGE  Take 1 tablet (500 mg total) by mouth 2 (two) times daily with a meal.     metoprolol succinate 50 MG 24 hr tablet  Commonly known as:  TOPROL-XL  Take 1 tablet (50 mg total) by mouth daily.     multivitamin Tabs tablet  Take 1 tablet by mouth daily.     oxyCODONE-acetaminophen 5-325 MG per tablet  Commonly known as:  PERCOCET/ROXICET  Take 1-2 tablets by mouth every 4 (  four) hours as needed for moderate pain.     polyethylene glycol packet  Commonly known as:  MIRALAX / GLYCOLAX  Take 17 g by mouth daily as needed.     potassium chloride SA 20 MEQ tablet  Commonly known as:  KLOR-CON M20  Take 1 tablet (20 mEq total) by mouth 2 (two) times daily.     sertraline 100 MG tablet  Commonly known as:  ZOLOFT  Take 1 and 1/2 tablets by mouth daily.     torsemide 20 MG tablet  Commonly known as:  DEMADEX  Take 1 tablet (20 mg total) by mouth daily.     traZODone 100 MG tablet  Commonly known as:  DESYREL  Take 1 tablet (100 mg total) by mouth at bedtime.     warfarin 10 MG tablet  Commonly known as:  COUMADIN  Take 5-10 mg by mouth daily. Take 5mg  on Monday, Wednesday and Friday.  All other days of the week take 10mg .         Follow-up  Information   Follow up with [facility] E, MD In 3 weeks. (For post-operation check from your gallbladder surgery)    Specialty:  General Surgery   Contact information:   [place] [address] [phone]       Follow up with Beryle Beams, MD. Schedule an appointment as soon as possible for a visit in 2 weeks. (For post-procedure check)    Specialty:  Gastroenterology   Contact information:   [address] [place] [address] [phone]       Follow up with [facility], MD. Schedule an appointment as soon as possible for a visit in 2 weeks. (For post hospital follow up)    Specialty:  Family Medicine   Contact information:   [place] [address] [phone]       Follow up with [facility] On 06/03/2014. (For coumadin monitoring & lab work)    Sport and exercise psychologist information:   [place] [phone]       Signed: Excell Seltzer [facility] [phone]  06/01/2014, 10:03 AM

## 2014-06-01 NOTE — Progress Notes (Signed)
ANTICOAGULATION CONSULT NOTE - Follow Up Consult  Pharmacy Consult for Heparin/Coumadin Indication: h/o recurrent PE and DVT   Allergies  Allergen Reactions  . Lisinopril Other (See Comments)    cough  . Ace Inhibitors     cough    Patient Measurements: Height: 5\' 11"  (180.3 cm) Weight: 315 lb 4.1 oz (143 kg) IBW/kg (Calculated) : 75.3 Heparin Dosing Weight: 109 kg  Vital Signs: Temp: 98.2 F (36.8 C) (07/07 0510) Temp src: Oral (07/07 0510) BP: 130/73 mmHg (07/07 0510) Pulse Rate: 82 (07/07 0510)  Labs:  Recent Labs  05/30/14 0340  05/31/14 0222 05/31/14 1044 05/31/14 1910 06/01/14 0301  HGB 13.3  --  12.5*  --   --  11.8*  HCT 41.3  --  38.5*  --   --  36.6*  PLT 240  --  243  --   --  261  LABPROT 17.1*  --  20.5*  --   --  22.0*  INR 1.39  --  1.76*  --   --  1.92*  HEPARINUNFRC 0.24*  < > 0.26* 0.25* 0.61 0.48  < > = values in this interval not displayed.  Estimated Creatinine Clearance: 165.3 ml/min (by C-G formula based on Cr of 0.71).    Assessment: Acute acalculous cholecystitis s/p ERCP 6/30 --S/P lap chole with bile leak (Dr. Grandville Silos - 05/24/14)  --ERCP stent placement (Dr. Benson Norway 05/25/14)  38 YOM on warfarin PTA (home dose 10mg  daily except 5mg  on MWF) s/p INR reversal with 2 doses of Vitamin K 10mg  IV as well as lap chole and ERCP. Currently on IV heparin bridge and Coumadin for history of recurrent VTE's.   Anticoagulation: h/o mult VTEs. Heparin level 0.48 in goal. INR up to 1.92 after overcoming the IV Vitamin K. Hgb 13.3>>12.5>>11.8 this AM. Plts ok.  Infectious Disease: s/p Zosyn 6/26>>7/2, afeb, WBC 13.1 down.  Cardiovascular: HTN -VSS On norvasc, toprol, torsemide   Endocrinology: DM - CBGs controlled on SSI, 98-133  Gastrointestinal / Nutrition: heart diet - PPI po, miralax, docusate (LBM 7/1?)  Neurology: depression / spinal stenosis (back surgery x 6) >> mild pain per MD. On Zoloft, trazodone  Nephrology: SCr stable, K+ 3.8    Pulmonary: OSA - stable on RA  Hematology / Oncology: Hgb drifting down slowly. Plts stable.  Best Practices: PPI, hep + warf, home meds addressed   Goal of Therapy:  Heparin level 0.3-0.7 units/ml INR 2-3 Monitor platelets by anticoagulation protocol: Yes   Plan:  Continue IV heparin at 3600 units/hr  Coumadin 12.5mg  po x 1 again this PM.   Jamilee Lafosse S. Alford Highland, PharmD, BCPS Clinical Staff Pharmacist Pager [phone]  Eilene Ghazi Stillinger 06/01/2014,7:20 AM

## 2014-06-01 NOTE — Discharge Instructions (Addendum)
1.  Take last dose of Lovenox tomorrow morning (06/02/14) 2.  Take normal coumadin dosage at night on 06/02/14 3.  Get appointment with Dr. Benson Norway (GI doctor) for bile duct stent removal in 3-4 weeks 4.  Make appointment with your coumadin lab for PT/INR testing on 06/03/14 or 06/04/14. 5.  Complete all of your antibiotic (Augmentin) for 7 days 6.  Take pain medication as needed only  LAPAROSCOPIC SURGERY: POST OP INSTRUCTIONS  1. DIET: Follow a light bland diet the first 24 hours after arrival home, such as soup, liquids, crackers, etc. Be sure to include lots of fluids daily. Avoid fast food or heavy meals as your are more likely to get nauseated. Eat a low fat the next few days after surgery.  2. Take your usually prescribed home medications unless otherwise directed. 3. PAIN CONTROL:  a. Pain is best controlled by a usual combination of three different methods TOGETHER:  i. Ice/Heat ii. Over the counter pain medication iii. Prescription pain medication b. Most patients will experience some swelling and bruising around the incisions. Ice packs or heating pads (30-60 minutes up to 6 times a day) will help. Use ice for the first few days to help decrease swelling and bruising, then switch to heat to help relax tight/sore spots and speed recovery. Some people prefer to use ice alone, heat alone, alternating between ice & heat. Experiment to what works for you. Swelling and bruising can take several weeks to resolve.  c. It is helpful to take an over-the-counter pain medication regularly for the first few weeks. Choose one of the following that works best for you:  i. Naproxen (Aleve, etc) Two 220mg  tabs twice a day ii. Ibuprofen (Advil, etc) Three 200mg  tabs four times a day (every meal & bedtime) iii. Acetaminophen (Tylenol, etc) 500-650mg  four times a day (every meal & bedtime) d. A prescription for pain medication (such as oxycodone, hydrocodone, etc) should be given to you upon discharge. Take your  pain medication as prescribed.  i. If you are having problems/concerns with the prescription medicine (does not control pain, nausea, vomiting, rash, itching, etc), please call us [phone] to see if we need to switch you to a different pain medicine that will work better for you and/or control your side effect better. ii. If you need a refill on your pain medication, please contact your pharmacy. They will contact our office to request authorization. Prescriptions will not be filled after 5 pm or on week-ends. 4. Avoid getting constipated. Between the surgery and the pain medications, it is common to experience some constipation. Increasing fluid intake and taking a fiber supplement (such as Metamucil, Citrucel, FiberCon, MiraLax, etc) 1-2 times a day regularly will usually help prevent this problem from occurring. A mild laxative (prune juice, Milk of Magnesia, MiraLax, etc) should be taken according to package directions if there are no bowel movements after 48 hours.  5. Watch out for diarrhea. If you have many loose bowel movements, simplify your diet to bland foods & liquids for a few days. Stop any stool softeners and decrease your fiber supplement. Switching to mild anti-diarrheal medications (Kayopectate, Pepto Bismol) can help. If this worsens or does not improve, please call us. 6. Wash / shower every day. You may shower over the dressings as they are waterproof. Continue to shower over incision(s) after the dressing is off. 7. Remove your waterproof bandages 5 days after surgery. You may leave the incision open to air. You may replace a  dressing/Band-Aid to cover the incision for comfort if you wish.  8. ACTIVITIES as tolerated:  a. You may resume regular (light) daily activities beginning the next day--such as daily self-care, walking, climbing stairs--gradually increasing activities as tolerated. If you can walk 30 minutes without difficulty, it is safe to try more intense activity such as  jogging, treadmill, bicycling, low-impact aerobics, swimming, etc. b. Save the most intensive and strenuous activity for last such as sit-ups, heavy lifting, contact sports, etc Refrain from any heavy lifting or straining until you are off narcotics for pain control.  c. DO NOT PUSH THROUGH PAIN. Let pain be your guide: If it hurts to do something, don't do it. Pain is your body warning you to avoid that activity for another week until the pain goes down. d. You may drive when you are no longer taking prescription pain medication, you can comfortably wear a seatbelt, and you can safely maneuver your car and apply brakes. e. Dennis Bast may have sexual intercourse when it is comfortable.  9. FOLLOW UP in our office  a. Please call [facility] at [phone] to set up an appointment to see your surgeon in the office for a follow-up appointment approximately 2-3 weeks after your surgery. b. Make sure that you call for this appointment the day you arrive home to insure a convenient appointment time.      10. IF YOU HAVE DISABILITY OR FAMILY LEAVE FORMS, BRING THEM TO THE OFFICE FOR PROCESSING.   WHEN TO CALL us [phone]:  1. Poor pain control 2. Reactions / problems with new medications (rash/itching, nausea, etc)  3. Fever over 101.5 F (38.5 C) 4. Inability to urinate 5. Nausea and/or vomiting 6. Worsening swelling or bruising 7. Continued bleeding from incision. 8. Increased pain, redness, or drainage from the incision  The clinic staff is available to answer your questions during regular business hours (8:30am-5pm). Please dont hesitate to call and ask to speak to one of our nurses for clinical concerns.  If you have a medical emergency, go to the nearest emergency room or call 911.  A surgeon from [facility] is always on call at the [facility], [place], [place], [place],  [address] ?  MAIN: [phone] ? TOLL FREE: [phone]  ?  FAX [phone]  [email or web address]

## 2014-06-01 NOTE — Care Management Note (Signed)
  Page 1 of 1   06/01/2014     10:56:08 AM CARE MANAGEMENT NOTE 06/01/2014  Patient:  Nicolas [name], Nicolas [name]   Account Number:  [id number]  Date Initiated:  05/25/2014  Documentation initiated by:  Magdalen Spatz  Subjective/Objective Assessment:     Action/Plan:   Anticipated DC Date:  06/01/2014   Anticipated DC Plan:  HOME/SELF CARE         Choice offered to / List presented to:             Status of service:   Medicare Important Message given?  YES (If response is "NO", the following Medicare IM given date fields will be blank) Date Medicare IM given:  05/25/2014 Medicare IM given by:  Magdalen Spatz Date Additional Medicare IM given:  05/31/2014 Additional Medicare IM given by:  Magdalen Spatz  Discharge Disposition:    Per UR Regulation:    If discussed at [place] Length of Stay Meetings, dates discussed:   05/27/2014  06/01/2014    Comments:  06-01-14 Consult for assistance with medication. Confirmed with patient he does have prescription coverage , explained unable to assist with co pays . Patient has had Lovenox in past and stated a box was very expensive . Confirmed with PA prescription is only for one dose , explained to patient , patient comfortable with paying co pay. Offered to call prescription in to patient's pharmacy , however patinet wants to use [facility] and stated he did not realize only one dose ordered . Patient does not want called in.  Magdalen Spatz RN BSN

## 2014-06-01 NOTE — Progress Notes (Signed)
Pt. Refused Lovenox teaching and said he had been doing it all the time.

## 2014-06-01 NOTE — Discharge Summary (Signed)
Imogene Burn. Georgette Dover, MD, [facility]  General/ Trauma Surgery  06/01/2014 1:27 PM

## 2014-06-02 NOTE — Progress Notes (Signed)
Patient discahrge to home. Discharge instruction given. No question verbalized.

## 2014-06-04 ENCOUNTER — Ambulatory Visit (INDEPENDENT_AMBULATORY_CARE_PROVIDER_SITE_OTHER): Payer: Medicare HMO | Admitting: Pharmacist

## 2014-06-04 DIAGNOSIS — Z86711 Personal history of pulmonary embolism: Secondary | ICD-10-CM

## 2014-06-04 DIAGNOSIS — Z7901 Long term (current) use of anticoagulants: Secondary | ICD-10-CM

## 2014-06-04 LAB — POCT INR: INR: 3.9

## 2014-06-06 IMAGING — CT CT ABD-PELV W/ CM
2 of 5 series · 15 of 46 positions shown, 17 images · IV contrast (APPLIED)
Comparison: None.

CLINICAL DATA: Abdominal pain.

EXAM:
CT ABDOMEN AND PELVIS WITH CONTRAST
TECHNIQUE: Multidetector CT imaging of the abdomen and pelvis was performed
using the standard protocol following bolus administration of
intravenous contrast.
CONTRAST:  100mL OMNIPAQUE IOHEXOL 300 MG/ML  SOLN

[Series 2: abd/ pelvis 5.0 i30f 1 · axial · 0.98mm/px · z∈[+132,+638]mm · 12 of 113 slices shown, 14 images]
[im 6/113  soft-tissue]
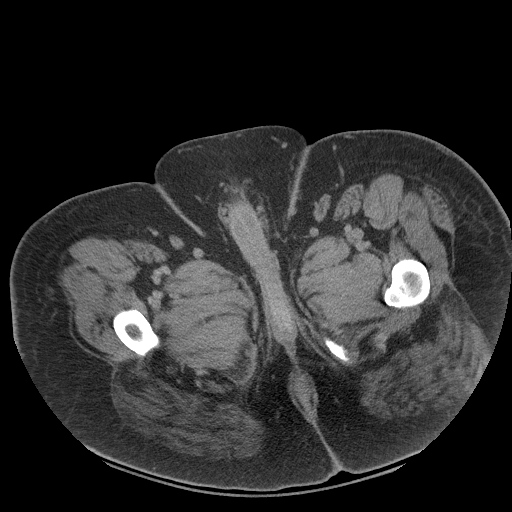
[im 6/113  bone]
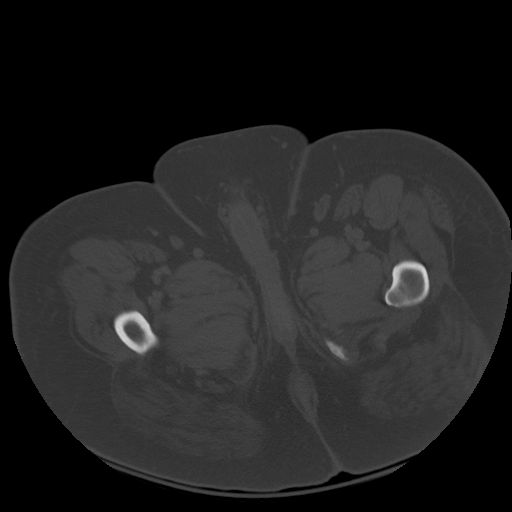
[im 18/113  soft-tissue]
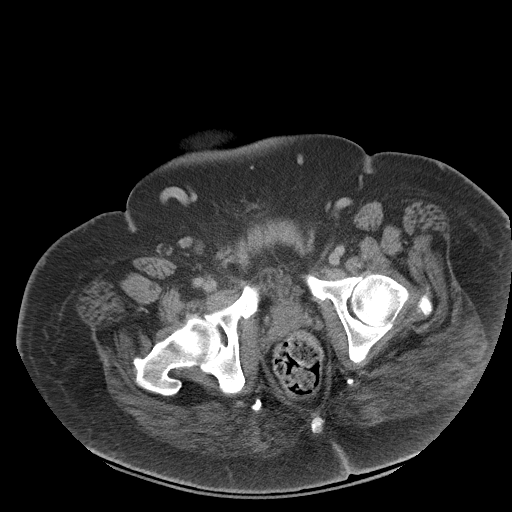
[im 24/113  soft-tissue]
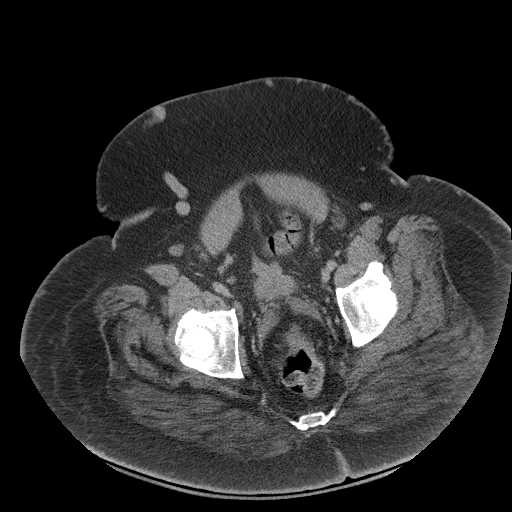
[im 36/113  soft-tissue]
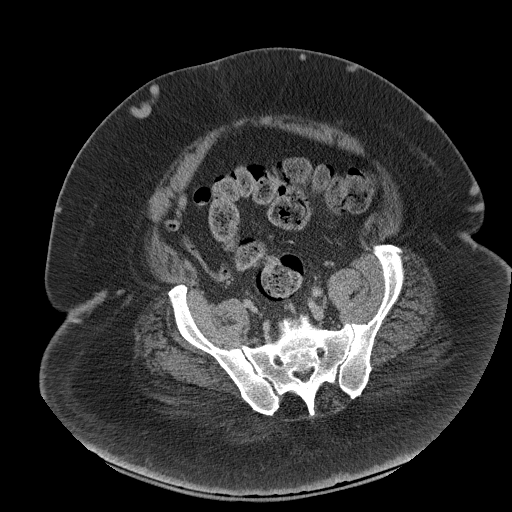
[im 42/113  soft-tissue]
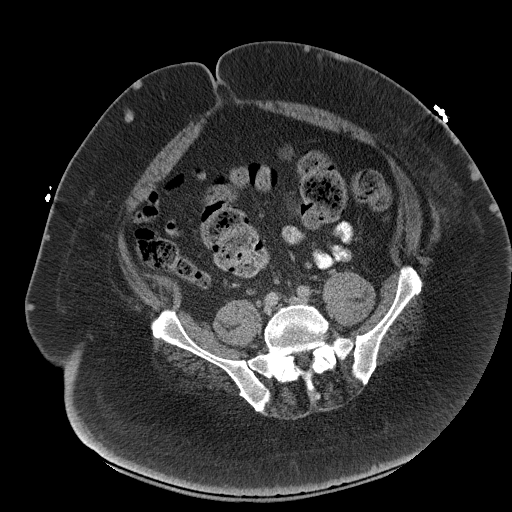
[im 54/113  soft-tissue]
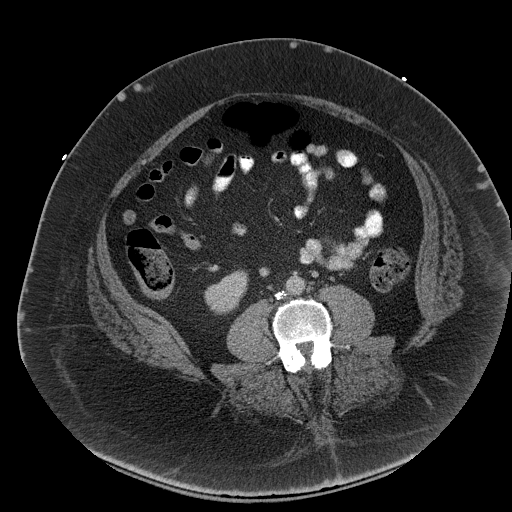
[im 59/113  soft-tissue]
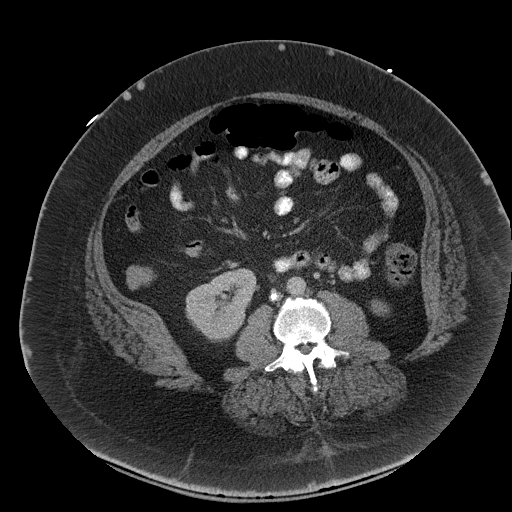
[im 71/113  soft-tissue]
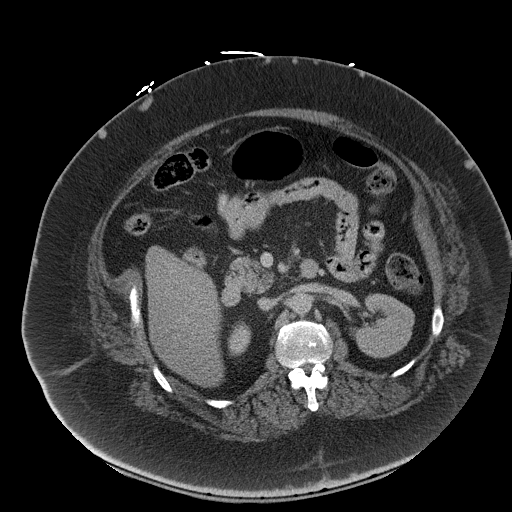
[im 77/113  soft-tissue]
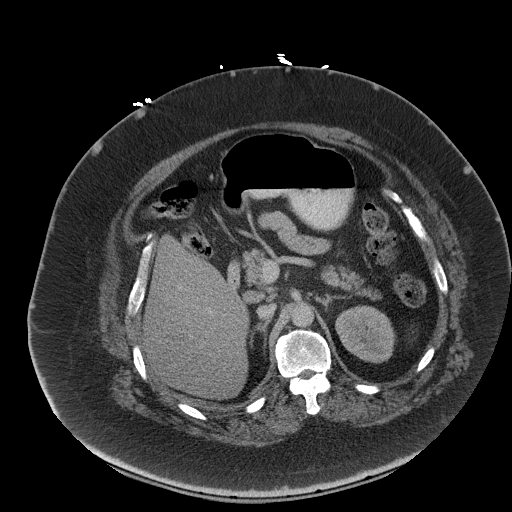
[im 77/113  bone]
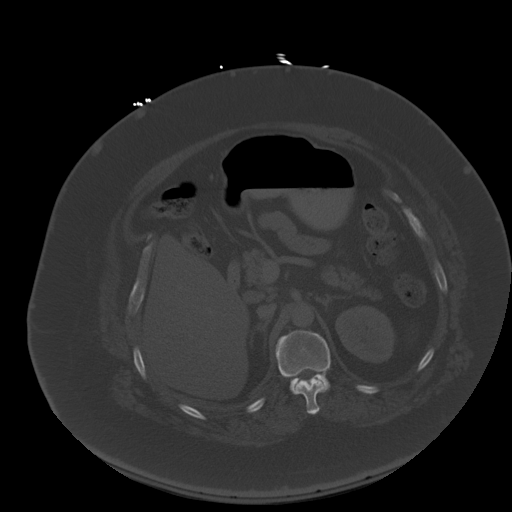
[im 89/113  soft-tissue]
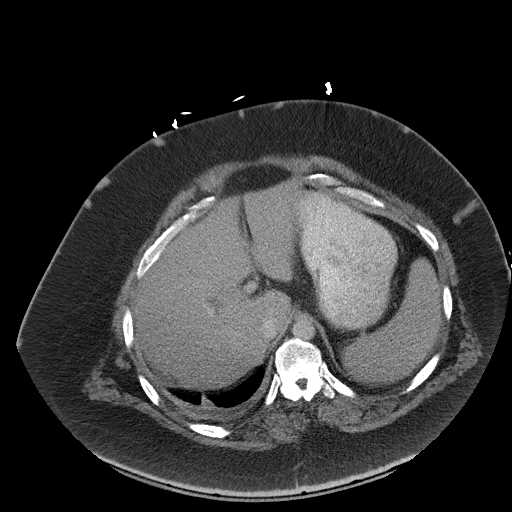
[im 95/113  soft-tissue]
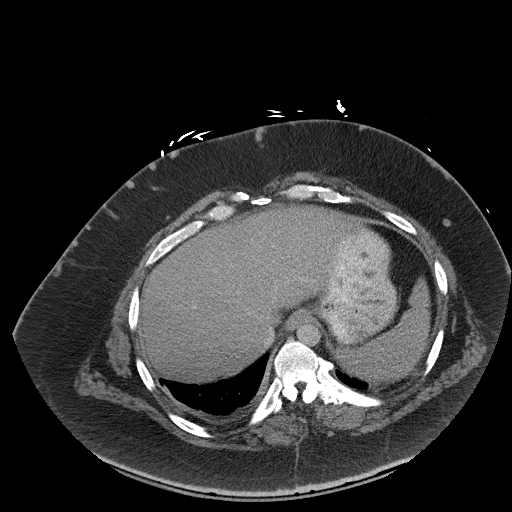
[im 107/113  soft-tissue]
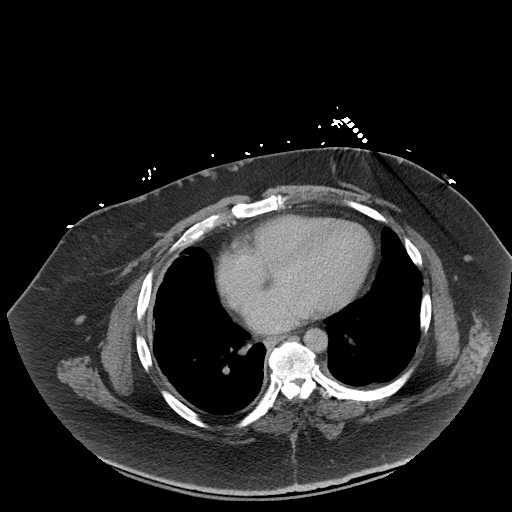

[Series 5: cor · coronal · 0.94mm/px · 3 of 221 slices shown]
[im 74/221  soft-tissue]
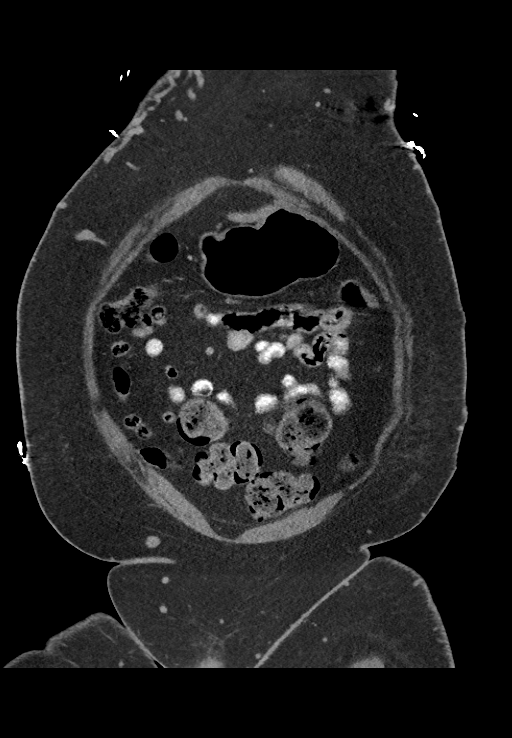
[im 98/221  soft-tissue]
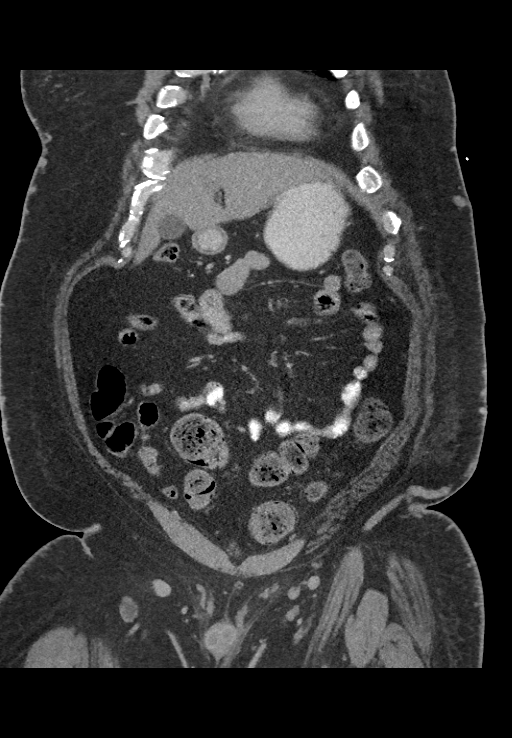
[im 123/221  soft-tissue]
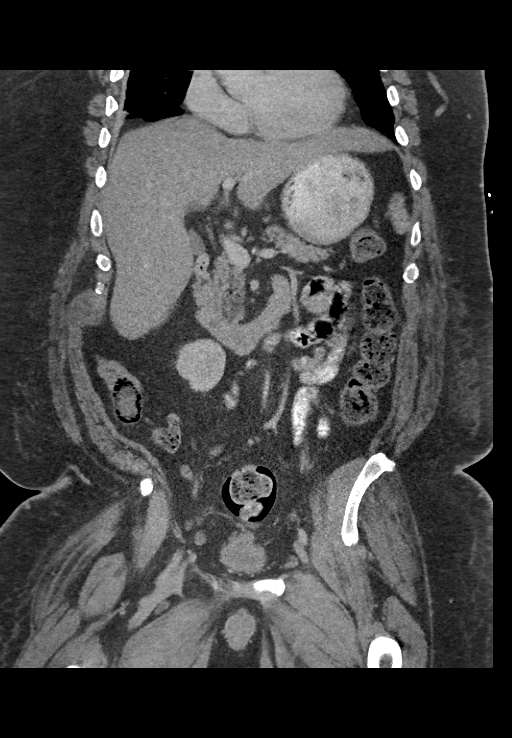

[15 of 46 positions shown; findings below may reference images not displayed]

FINDINGS: BODY WALL: Numerous subcutaneous venous collaterals related to IVC
stenosis or occlusion.

LOWER CHEST: Small right pleural effusion. Mild basilar atelectasis
or scarring.

ABDOMEN/PELVIS:

Liver: No focal abnormality.Diffuse fatty infiltration of the liver.

Biliary: No evidence of biliary obstruction or stone.

Pancreas: Unremarkable.

Spleen: Unremarkable.

Adrenals: Unremarkable.

Kidneys and ureters: No hydronephrosis or stone. 16 mm cyst in the
lower pole left kidney.

Bladder: Unremarkable given decompressed state.

Reproductive: Unremarkable.

Bowel: No obstruction. Moderate volume of formed stool,
predominantly in the mid and distal colon, without evidence of
proximal obstruction.

Retroperitoneum: No mass or adenopathy.

Peritoneum: No free fluid or gas.

Vascular: Infrarenal IVC filter. The filter and the cava is
collapsed, with venous collaterals suggesting flow limiting
stenosis. One of the medial to tines penetrates the wall of the
neighboring infrarenal aorta.

OSSEOUS: Extensive facet osteoarthritis with posterior ligament
ossification involving the lower thoracic and lumbar spine. There
has been decompressive laminectomies from T8-T10. Bulky posterior
ligament ossification and thickening continues to narrow the spinal
canal, especially at the level of T7. Diffuse foraminal stenoses
related to the same.
IMPRESSION: 1. No evidence of acute intra-abdominal disease.
2. Chronic flow limiting stenosis of the infrarenal IVC, at the
level of a caval filter.
3. Spinal canal stenosis, most advanced at the level of T7,
secondary to dorsal ligamentous ossification.

## 2014-06-07 ENCOUNTER — Ambulatory Visit (INDEPENDENT_AMBULATORY_CARE_PROVIDER_SITE_OTHER): Payer: Medicare HMO | Admitting: Internal Medicine

## 2014-06-07 DIAGNOSIS — Z7901 Long term (current) use of anticoagulants: Secondary | ICD-10-CM

## 2014-06-07 DIAGNOSIS — Z86711 Personal history of pulmonary embolism: Secondary | ICD-10-CM

## 2014-06-07 LAB — POCT INR: INR: 3.1

## 2014-06-09 ENCOUNTER — Encounter: Payer: Self-pay | Admitting: Neurology

## 2014-06-17 ENCOUNTER — Ambulatory Visit (INDEPENDENT_AMBULATORY_CARE_PROVIDER_SITE_OTHER): Payer: Medicare HMO | Admitting: Internal Medicine

## 2014-06-17 DIAGNOSIS — Z7901 Long term (current) use of anticoagulants: Secondary | ICD-10-CM

## 2014-06-17 DIAGNOSIS — Z86711 Personal history of pulmonary embolism: Secondary | ICD-10-CM

## 2014-06-17 LAB — POCT INR: INR: 2.2

## 2014-06-18 ENCOUNTER — Ambulatory Visit (INDEPENDENT_AMBULATORY_CARE_PROVIDER_SITE_OTHER): Payer: Commercial Managed Care - HMO | Admitting: Family Medicine

## 2014-06-18 ENCOUNTER — Encounter: Payer: Self-pay | Admitting: Family Medicine

## 2014-06-18 VITALS — BP 110/58 | HR 73 | Temp 98.3°F | Resp 16

## 2014-06-18 DIAGNOSIS — I10 Essential (primary) hypertension: Secondary | ICD-10-CM

## 2014-06-18 DIAGNOSIS — Z23 Encounter for immunization: Secondary | ICD-10-CM

## 2014-06-18 DIAGNOSIS — E119 Type 2 diabetes mellitus without complications: Secondary | ICD-10-CM

## 2014-06-18 NOTE — Patient Instructions (Signed)
Pneumococcal Conjugate Vaccine: What You Need to Know  Your doctor recommends that you, or your child, get a dose of PCV13 today. 1. Why get vaccinated? Pneumococcal conjugate vaccine (called PCV13 or Prevnar 13) is recommended to protect infants and toddlers, and some older children and adults with certain health conditions, from pneumococcal disease. Pneumococcal disease is caused by infection with Streptococcus pneumoniae bacteria. These bacteria can spread from person to person through close contact. Pneumococcal disease can lead to severe health problems, including pneumonia, blood infections, and meningitis. Meningitis is an infection of the covering of the brain. Pneumococcal meningitis is fairly rare (less than 1 case per 100,000 people each year), but it leads to other health problems, including deafness and brain damage. In children, it is fatal in about 1 case out of 10. Children younger than two are at higher risk for serious disease than older children. People with certain medical conditions, people over age 76, and cigarette smokers are also at higher risk. Before vaccine, pneumococcal infections caused many problems each year in the [place] in children younger than 5, including:  more than 700 cases of meningitis,  13,000 blood infections,  about 5 million ear infections, and  about 200 deaths. About 4,000 adults still die each year because of pneumococcal infections. Pneumococcal infections can be hard to treat because some strains are resistant to antibiotics. This makes prevention through vaccination even more important. 2. PCV13 vaccine There are more than 90 types of pneumococcal bacteria. PCV13 protects against 13 of them. These 13 strains cause most severe infections in children and about half of infections in adults.  PCV13 is routinely given to children at 2, 4, 6, and 28-46 months of age. Children in this age range are at greatest risk for serious diseases caused  by pneumococcal infection. PCV13 vaccine may also be recommended for some older children or adults. Your doctor can give you details. A second type of pneumococcal vaccine, called PPSV23, may also be given to some children and adults, including anyone over age 57. There is a separate Vaccine Information Statement for this vaccine. 3. Precautions  Anyone who has ever had a life-threatening allergic reaction to a dose of this vaccine, to an earlier pneumococcal vaccine called PCV7 (or Prevnar), or to any vaccine containing diphtheria toxoid (for example, DTaP), should not get PCV13. Anyone with a severe allergy to any component of PCV13 should not get the vaccine. Tell your doctor if the person being vaccinated has any severe allergies. If the person scheduled for vaccination is sick, your doctor might decide to reschedule the shot on another day. Your doctor can give you more information about any of these precautions. 4. What are the risks of PCV13 vaccine?  With any medicine, including vaccines, there is a chance of side effects. These are usually mild and go away on their own, but serious reactions are also possible. Reported problems associated with PCV13 vary by dose and age, but generally:  About half of children became drowsy after the shot, had a temporary loss of appetite, or had redness or tenderness where the shot was given.  About 1 out of 3 had swelling where the shot was given.  About 1 out of 3 had a mild fever, and about 1 in 20 had a higher fever (over 102.39F).  Up to about 8 out of 10 became fussy or irritable. Adults receiving the vaccine have reported redness, pain, and swelling where the shot was given. Mild fever, fatigue, headache, chills,  or muscle pain have also been reported. Life-threatening allergic reactions from any vaccine are very rare. 5. What if there is a serious reaction? What should I look for?  Look for anything that concerns you, such as signs of a  severe allergic reaction, very high fever, or behavior changes. Signs of a severe allergic reaction can include hives, swelling of the face and throat, difficulty breathing, a fast heartbeat, dizziness, and weakness. These would start a few minutes to a few hours after the vaccination. What should I do?  If you think it is a severe allergic reaction or other emergency that can't wait, call 9-1-1 or get the person to the nearest hospital. Otherwise, call your doctor.  Afterward, the reaction should be reported to the Vaccine Adverse Event Reporting System (VAERS). Your doctor might file this report, or you can do it yourself through the VAERS web site at www.vaers.SamedayNews.es, or by calling (807) 146-9463. VAERS is only for reporting reactions. They do not give medical advice. 6. The National Vaccine Injury Compensation Program The [employer] Vaccine Injury Compensation Program (VICP) is a federal program that was created to compensate people who may have been injured by certain vaccines. Persons who believe they may have been injured by a vaccine can learn about the program and about filing a claim by calling 934-808-9238 or visiting the [place] website at GoldCloset.com.ee. 7. How can I learn more?  Ask your doctor.  Call your local or state health department.  Contact the Centers for Disease Control and Prevention (CDC):  Call 224-565-6022 (1-800-CDC-INFO) or  Visit CDC's website at http://hunter.com/ CDC PCV13 Vaccine VIS (Interim) (01/23/12) Document Released: 09/09/2006 Document Revised: 03/29/2014 Document Reviewed: 01/01/2014 [facility] Patient Information 2015 [place], [place]. This information is not intended to replace advice given to you by your health care provider. Make sure you discuss any questions you have with your health care provider.  Tdap Vaccine (Tetanus, Diphtheria, Pertussis): What You Need to Know 1. Why get vaccinated? Tetanus, diphtheria and pertussis  can be very serious diseases, even for adolescents and adults. Tdap vaccine can protect [place] from these diseases. TETANUS (Lockjaw) causes painful muscle tightening and stiffness, usually all over the body.  It can lead to tightening of muscles in the head and neck so you can't open your mouth, swallow, or sometimes even breathe. Tetanus kills about 1 out of 5 people who are infected. DIPHTHERIA can cause a thick coating to form in the back of the throat.  It can lead to breathing problems, paralysis, heart failure, and death. PERTUSSIS (Whooping Cough) causes severe coughing spells, which can cause difficulty breathing, vomiting and disturbed sleep.  It can also lead to weight loss, incontinence, and rib fractures. Up to 2 in 100 adolescents and 5 in 100 adults with pertussis are hospitalized or have complications, which could include pneumonia or death. These diseases are caused by bacteria. Diphtheria and pertussis are spread from person to person through coughing or sneezing. Tetanus enters the body through cuts, scratches, or wounds. Before vaccines, the [place] saw as many as 200,000 cases a year of diphtheria and pertussis, and hundreds of cases of tetanus. Since vaccination began, tetanus and diphtheria have dropped by about 99% and pertussis by about 80%. 2. Tdap vaccine Tdap vaccine can protect adolescents and adults from tetanus, diphtheria, and pertussis. One dose of Tdap is routinely given at age 35 or 76. People who did not get Tdap at that age should get it as soon as possible. Tdap is especially  important for health care professionals and anyone having close contact with a baby younger than 12 months. Pregnant women should get a dose of Tdap during every pregnancy, to protect the newborn from pertussis. Infants are most at risk for severe, life-threatening complications from pertussis. A similar vaccine, called Td, protects from tetanus and diphtheria, but not pertussis. A Td  booster should be given every 10 years. Tdap may be given as one of these boosters if you have not already gotten a dose. Tdap may also be given after a severe cut or burn to prevent tetanus infection. Your doctor can give you more information. Tdap may safely be given at the same time as other vaccines. 3. Some people should not get this vaccine  If you ever had a life-threatening allergic reaction after a dose of any tetanus, diphtheria, or pertussis containing vaccine, OR if you have a severe allergy to any part of this vaccine, you should not get Tdap. Tell your doctor if you have any severe allergies.  If you had a coma, or long or multiple seizures within 7 days after a childhood dose of DTP or DTaP, you should not get Tdap, unless a cause other than the vaccine was found. You can still get Td.  Talk to your doctor if you:  have epilepsy or another nervous system problem,  had severe pain or swelling after any vaccine containing diphtheria, tetanus or pertussis,  ever had Guillain-Barr Syndrome (GBS),  aren't feeling well on the day the shot is scheduled. 4. Risks of a vaccine reaction With any medicine, including vaccines, there is a chance of side effects. These are usually mild and go away on their own, but serious reactions are also possible. Brief fainting spells can follow a vaccination, leading to injuries from falling. Sitting or lying down for about 15 minutes can help prevent these. Tell your doctor if you feel dizzy or light-headed, or have vision changes or ringing in the ears. Mild problems following Tdap (Did not interfere with activities)  Pain where the shot was given (about 3 in 4 adolescents or 2 in 3 adults)  Redness or swelling where the shot was given (about 1 person in 5)  Mild fever of at least 100.73F (up to about 1 in 25 adolescents or 1 in 100 adults)  Headache (about 3 or 4 people in 10)  Tiredness (about 1 person in 3 or 4)  Nausea, vomiting,  diarrhea, stomach ache (up to 1 in 4 adolescents or 1 in 10 adults)  Chills, body aches, sore joints, rash, swollen glands (uncommon) Moderate problems following Tdap (Interfered with activities, but did not require medical attention)  Pain where the shot was given (about 1 in 5 adolescents or 1 in 100 adults)  Redness or swelling where the shot was given (up to about 1 in 16 adolescents or 1 in 25 adults)  Fever over 102F (about 1 in 100 adolescents or 1 in 250 adults)  Headache (about 3 in 20 adolescents or 1 in 10 adults)  Nausea, vomiting, diarrhea, stomach ache (up to 1 or 3 people in 100)  Swelling of the entire arm where the shot was given (up to about 3 in 100). Severe problems following Tdap (Unable to perform usual activities; required medical attention)  Swelling, severe pain, bleeding and redness in the arm where the shot was given (rare). A severe allergic reaction could occur after any vaccine (estimated less than 1 in a million doses). 5. What if there is  a serious reaction? What should I look for?  Look for anything that concerns you, such as signs of a severe allergic reaction, very high fever, or behavior changes. Signs of a severe allergic reaction can include hives, swelling of the face and throat, difficulty breathing, a fast heartbeat, dizziness, and weakness. These would start a few minutes to a few hours after the vaccination. What should I do?  If you think it is a severe allergic reaction or other emergency that can't wait, call 9-1-1 or get the person to the nearest hospital. Otherwise, call your doctor.  Afterward, the reaction should be reported to the "Vaccine Adverse Event Reporting System" (VAERS). Your doctor might file this report, or you can do it yourself through the VAERS web site at www.vaers.SamedayNews.es, or by calling (818)309-4667. VAERS is only for reporting reactions. They do not give medical advice.  6. The National Vaccine Injury Compensation  Program The [employer] Vaccine Injury Compensation Program (VICP) is a federal program that was created to compensate people who may have been injured by certain vaccines. Persons who believe they may have been injured by a vaccine can learn about the program and about filing a claim by calling 581-848-3334 or visiting the [place] website at GoldCloset.com.ee. 7. How can I learn more?  Ask your doctor.  Call your local or state health department.  Contact the Centers for Disease Control and Prevention (CDC):  Call 406-366-4449 or visit CDC's website at http://hunter.com/. CDC Tdap Vaccine VIS (04/03/12) Document Released: 05/13/2012 Document Revised: 03/29/2014 Document Reviewed: 02/24/2014 [employer] Patient Information 2015 [place], [employer]. This information is not intended to replace advice given to you by your health care provider. Make sure you discuss any questions you have with your health care provider.

## 2014-06-19 ENCOUNTER — Encounter: Payer: Self-pay | Admitting: Family Medicine

## 2014-06-19 NOTE — Progress Notes (Signed)
S;  This 48 y.o. AA male has well controlled HTN and Type II DM. Pt recently hospitalized for cholecystectomy on 05/24/2014 (Dr. Georganna Skeans); A1c at admission= 6.0%. Post-op complication required ERCP on 05/25/2014 (Dr. Benson Norway).  Pt is at home now and recuperating but has little appetite; he eats several small meals daily. He is compliant with medications. He reports some weight loss (last known weight = ~325 lbs per pt).   Pt underwent thoracic spine surgery in Feb 3235 (Dr. Cathren Laine) in [place], [place]. Recent MRI of spine shows seromas; pt awaiting follow-up appt w/ neurosurgeon. He may require another procedure to drain fluid collections.   Patient Active Problem List   Diagnosis Date Noted  . Hypoxemia 05/23/2014  . Acute acalculous cholecystitis 05/21/2014  . Chronic anticoagulation 05/21/2014  . Spinal stenosis 12/02/2013  . Type II or unspecified type diabetes mellitus without mention of complication, not stated as uncontrolled 08/03/2013  . Venous stasis dermatitis 09/26/2012  . Encounter for long-term (current) use of anticoagulants 03/26/2012  . Acute venous embolism and thrombosis of deep vessels of proximal lower extremity 03/26/2012  . Spinal stenosis of lumbar region 03/06/2012  . HTN (hypertension) 03/06/2012  . Hx pulmonary embolism 03/06/2012  . Morbid obesity 03/06/2012  . Lower extremity edema 03/06/2012    Outpatient Encounter Prescriptions as of 06/18/2014  Medication Sig  . clotrimazole-betamethasone (LOTRISONE) cream Apply 1 application topically 2 (two) times daily.  Marland Kitchen docusate sodium 100 MG CAPS Take 100 mg by mouth 2 (two) times daily as needed for mild constipation.  Marland Kitchen ketoconazole (NIZORAL) 2 % shampoo apply 1 APPLICATION TOPICALLY once daily  . metFORMIN (GLUCOPHAGE) 500 MG tablet Take 1 tablet (500 mg total) by mouth 2 (two) times daily with a meal.  . metoprolol succinate (TOPROL-XL) 50 MG 24 hr tablet Take 1 tablet (50 mg total) by mouth daily.  .  multivitamin (ONE-A-DAY MEN'S) TABS tablet Take 1 tablet by mouth daily.  . polyethylene glycol (MIRALAX / GLYCOLAX) packet Take 17 g by mouth daily as needed.  . potassium chloride SA (KLOR-CON M20) 20 MEQ tablet Take 1 tablet (20 mEq total) by mouth 2 (two) times daily.  . sertraline (ZOLOFT) 100 MG tablet Take 1 and 1/2 tablets by mouth daily.  Marland Kitchen torsemide (DEMADEX) 20 MG tablet Take 1 tablet (20 mg total) by mouth daily.  . traZODone (DESYREL) 100 MG tablet Take 1 tablet (100 mg total) by mouth at bedtime.  Marland Kitchen warfarin (COUMADIN) 10 MG tablet Take 5-10 mg by mouth daily. Take 5mg  on Monday, Wednesday and Friday.  All other days of the week take 10mg .    Allergies  Allergen Reactions  . Lisinopril Other (See Comments)    cough  . Ace Inhibitors     cough    PMHx, Surg Hx, Soc and Fam Hx reviewed.  ROS: As per HPI.  O: Filed Vitals:   06/18/14 1412  BP: 110/58  Pulse: 73  Temp: 98.3 F (36.8 C)  Resp: 16   GEN: In NAD; seated in WC. HENT: [place]/AT; EOMI w/ clear conj/sclerae. Otherwise unremarkable. COR: RRR. LUNGS: Unlabored resp. SKIN: W&D; intact. Lower extremities dry/scaly; no erythema. NEURO: A&O x 3; CNs intact. Nonfocal.   A/P: Type II or unspecified type diabetes mellitus without mention of complication, not stated as uncontrolled- DM is well controlled and pt has lost weight.  Essential hypertension- Stable and controlled. Continue current medications.  Need for prophylactic vaccination with combined diphtheria-tetanus-pertussis (DTP) vaccine - Plan: Tdap vaccine greater  than or equal to 7yo IM   Pt to follow-up with Dr. Jaynee Eagles office as scheduled; will keep me informed about neurosurgical status.

## 2014-06-24 ENCOUNTER — Ambulatory Visit (INDEPENDENT_AMBULATORY_CARE_PROVIDER_SITE_OTHER): Payer: Medicare HMO | Admitting: Cardiology

## 2014-06-24 DIAGNOSIS — Z7901 Long term (current) use of anticoagulants: Secondary | ICD-10-CM

## 2014-06-24 DIAGNOSIS — Z86711 Personal history of pulmonary embolism: Secondary | ICD-10-CM

## 2014-06-24 LAB — POCT INR: INR: 3.5

## 2014-06-28 ENCOUNTER — Telehealth: Payer: Self-pay | Admitting: *Deleted

## 2014-06-28 NOTE — Telephone Encounter (Signed)
Faxed signed order with note to [facility] attention: Jonette Pesa, per Dr Leward Quan.

## 2014-07-05 ENCOUNTER — Other Ambulatory Visit: Payer: Self-pay | Admitting: Gastroenterology

## 2014-07-07 ENCOUNTER — Encounter (HOSPITAL_COMMUNITY): Payer: Self-pay | Admitting: Pharmacy Technician

## 2014-07-07 ENCOUNTER — Encounter (HOSPITAL_COMMUNITY): Payer: Self-pay | Admitting: *Deleted

## 2014-07-08 ENCOUNTER — Ambulatory Visit (INDEPENDENT_AMBULATORY_CARE_PROVIDER_SITE_OTHER): Payer: Commercial Managed Care - HMO | Admitting: Internal Medicine

## 2014-07-08 DIAGNOSIS — Z7901 Long term (current) use of anticoagulants: Secondary | ICD-10-CM

## 2014-07-08 DIAGNOSIS — Z86711 Personal history of pulmonary embolism: Secondary | ICD-10-CM

## 2014-07-08 LAB — POCT INR: INR: 1.8

## 2014-07-09 ENCOUNTER — Telehealth: Payer: Self-pay

## 2014-07-09 DIAGNOSIS — Z789 Other specified health status: Secondary | ICD-10-CM

## 2014-07-09 DIAGNOSIS — Z7409 Other reduced mobility: Secondary | ICD-10-CM

## 2014-07-09 NOTE — Telephone Encounter (Signed)
Idell Pickles from [place] called requesting an elevated comode for this patient. She will be on vacation and advised [place] to contact Mr. Mccrone regarding the request.

## 2014-07-14 NOTE — Telephone Encounter (Signed)
Pended, please advise

## 2014-07-16 ENCOUNTER — Encounter (HOSPITAL_COMMUNITY): Payer: Self-pay

## 2014-07-16 ENCOUNTER — Encounter (HOSPITAL_COMMUNITY): Payer: Medicare HMO | Admitting: Anesthesiology

## 2014-07-16 ENCOUNTER — Ambulatory Visit (HOSPITAL_COMMUNITY): Payer: Medicare HMO | Admitting: Anesthesiology

## 2014-07-16 ENCOUNTER — Ambulatory Visit (HOSPITAL_COMMUNITY)
Admission: RE | Admit: 2014-07-16 | Discharge: 2014-07-16 | Disposition: A | Discharge status: Home / Self Care | Payer: Medicare HMO | Source: Ambulatory Visit | Attending: Gastroenterology | Admitting: Gastroenterology

## 2014-07-16 ENCOUNTER — Ambulatory Visit (HOSPITAL_COMMUNITY): Payer: Medicare HMO

## 2014-07-16 ENCOUNTER — Encounter (HOSPITAL_COMMUNITY)
Admission: RE | Disposition: A | Discharge status: Home / Self Care | Payer: Self-pay | Source: Ambulatory Visit | Attending: Gastroenterology

## 2014-07-16 DIAGNOSIS — Z888 Allergy status to other drugs, medicaments and biological substances status: Secondary | ICD-10-CM | POA: Diagnosis not present

## 2014-07-16 DIAGNOSIS — Z79899 Other long term (current) drug therapy: Secondary | ICD-10-CM | POA: Diagnosis not present

## 2014-07-16 DIAGNOSIS — K219 Gastro-esophageal reflux disease without esophagitis: Secondary | ICD-10-CM | POA: Diagnosis not present

## 2014-07-16 DIAGNOSIS — Z87891 Personal history of nicotine dependence: Secondary | ICD-10-CM | POA: Diagnosis not present

## 2014-07-16 DIAGNOSIS — E119 Type 2 diabetes mellitus without complications: Secondary | ICD-10-CM | POA: Insufficient documentation

## 2014-07-16 DIAGNOSIS — Z4682 Encounter for fitting and adjustment of non-vascular catheter: Secondary | ICD-10-CM | POA: Insufficient documentation

## 2014-07-16 DIAGNOSIS — Z7901 Long term (current) use of anticoagulants: Secondary | ICD-10-CM | POA: Insufficient documentation

## 2014-07-16 DIAGNOSIS — I10 Essential (primary) hypertension: Secondary | ICD-10-CM | POA: Diagnosis not present

## 2014-07-16 DIAGNOSIS — Z86711 Personal history of pulmonary embolism: Secondary | ICD-10-CM | POA: Insufficient documentation

## 2014-07-16 HISTORY — PX: ERCP: SHX5425

## 2014-07-16 LAB — GLUCOSE, CAPILLARY: Glucose-Capillary: 100 mg/dL — ABNORMAL HIGH (ref 70–99)

## 2014-07-16 SURGERY — ERCP, WITH INTERVENTION IF INDICATED
Anesthesia: General

## 2014-07-16 MED ORDER — PROPOFOL 10 MG/ML IV BOLUS
INTRAVENOUS | Status: AC
  Filled 2014-07-16: qty 20

## 2014-07-16 MED ORDER — PROMETHAZINE HCL 25 MG/ML IJ SOLN: 6.2500 mg | INTRAMUSCULAR | Status: DC | PRN

## 2014-07-16 MED ORDER — LACTATED RINGERS IV SOLN
INTRAVENOUS | Status: DC
Start: 2014-07-16 — End: 2014-07-18

## 2014-07-16 MED ORDER — FENTANYL CITRATE 0.05 MG/ML IJ SOLN
INTRAMUSCULAR | Status: AC
  Filled 2014-07-16: qty 2

## 2014-07-16 MED ORDER — LACTATED RINGERS IV SOLN
INTRAVENOUS | Status: DC | PRN
  Administered 2014-07-16: 09:00:00 via INTRAVENOUS

## 2014-07-16 MED ORDER — MEPERIDINE HCL 100 MG/ML IJ SOLN: 6.2500 mg | INTRAMUSCULAR | Status: DC | PRN

## 2014-07-16 MED ORDER — SUCCINYLCHOLINE CHLORIDE 20 MG/ML IJ SOLN
INTRAMUSCULAR | Status: DC | PRN
  Administered 2014-07-16: 100 mg via INTRAVENOUS

## 2014-07-16 MED ORDER — FENTANYL CITRATE 0.05 MG/ML IJ SOLN
INTRAMUSCULAR | Status: DC | PRN
  Administered 2014-07-16: 100 ug via INTRAVENOUS

## 2014-07-16 MED ORDER — SODIUM CHLORIDE 0.9 % IV SOLN: INTRAVENOUS | Status: DC

## 2014-07-16 MED ORDER — GLUCAGON HCL RDNA (DIAGNOSTIC) 1 MG IJ SOLR
INTRAMUSCULAR | Status: AC
  Filled 2014-07-16: qty 1

## 2014-07-16 MED ORDER — LACTATED RINGERS IV SOLN
INTRAVENOUS | Status: DC
  Administered 2014-07-16: 1000 mL via INTRAVENOUS

## 2014-07-16 MED ORDER — PROPOFOL 10 MG/ML IV BOLUS
INTRAVENOUS | Status: DC | PRN
  Administered 2014-07-16: 200 mg via INTRAVENOUS

## 2014-07-16 MED ORDER — IOHEXOL 350 MG/ML SOLN
INTRAVENOUS | Status: DC | PRN
  Administered 2014-07-16: 12:00:00

## 2014-07-16 MED ORDER — MIDAZOLAM HCL 2 MG/2ML IJ SOLN
INTRAMUSCULAR | Status: AC
  Filled 2014-07-16: qty 2

## 2014-07-16 NOTE — Anesthesia Preprocedure Evaluation (Addendum)
Anesthesia Evaluation  Patient identified by MRN, date of birth, ID band Patient awake    Reviewed: Allergy & Precautions, H&P , NPO status , Patient's Chart, lab work & pertinent test results, reviewed documented beta blocker date and time   History of Anesthesia Complications Negative for: history of anesthetic complications  Airway Mallampati: II TM Distance: >3 FB Neck ROM: Full    Dental  (+) Teeth Intact, Dental Advisory Given   Pulmonary sleep apnea (in process of CPAP titration, no mask yet) , former smoker, PE breath sounds clear to auscultation  Pulmonary exam normal       Cardiovascular hypertension, Pt. on medications and Pt. on home beta blockers DVT ('12) Rhythm:Regular Rate:Normal     Neuro/Psych Lumbar stenosis    GI/Hepatic GERD-  Medicated and Controlled,Elevated LFTs with acute chole N/v with Acute chole   Endo/Other  diabetes, Oral Hypoglycemic AgentsMorbid obesity  Renal/GU      Musculoskeletal   Abdominal (+) + obese,   Peds  Hematology  (+) Blood dyscrasia (Coumadin stopped 6/25, INR 1.26), ,   Anesthesia Other Findings   Reproductive/Obstetrics                          Anesthesia Physical  Anesthesia Plan  ASA: III  Anesthesia Plan: General ETT   Post-op Pain Management:    Induction: Intravenous  Airway Management Planned: Oral ETT  Additional Equipment:   Intra-op Plan:   Post-operative Plan:   Informed Consent: I have reviewed the patients History and Physical, chart, labs and discussed the procedure including the risks, benefits and alternatives for the proposed anesthesia with the patient or authorized representative who has indicated his/her understanding and acceptance.   Dental advisory given  Plan Discussed with:   Anesthesia Plan Comments:         Anesthesia Quick Evaluation

## 2014-07-16 NOTE — Anesthesia Postprocedure Evaluation (Signed)
  Anesthesia Post-op Note  Patient: Nicolas [name]  Procedure(s) Performed: Procedure(s) (LRB): ENDOSCOPIC RETROGRADE CHOLANGIOPANCREATOGRAPHY (ERCP) (N/A)  Patient Location: PACU  Anesthesia Type: MAC  Level of Consciousness: awake and alert   Airway and Oxygen Therapy: Patient Spontanous Breathing  Post-op Pain: mild  Post-op Assessment: Post-op Vital signs reviewed, Patient's Cardiovascular Status Stable, Respiratory Function Stable, Patent Airway and No signs of Nausea or vomiting  Last Vitals:  Filed Vitals:   07/16/14 1220  BP: 188/98  Pulse: 84  Temp:   Resp: 18    Post-op Vital Signs: stable   Complications: No apparent anesthesia complications

## 2014-07-16 NOTE — Op Note (Signed)
[facility], [id number]   ERCP PROCEDURE REPORT  PATIENT: Nicolas [name], Nicolas [name].  MR# :[id number] BIRTHDATE: Jan 21, 1966  GENDER: Male ENDOSCOPIST: Carol Ada, MD REFERRED BY: PROCEDURE DATE:  07/16/2014 PROCEDURE:   ERCP, diagnostic ASA CLASS:   Class III INDICATIONS:Stent removal. MEDICATIONS: MAC sedation, administered by CRNA TOPICAL ANESTHETIC: none  DESCRIPTION OF PROCEDURE:   After the risks benefits and alternatives of the procedure were thoroughly explained, informed consent was obtained.  The     endoscope was introduced through the mouth  and advanced to the second portion of the duodenum.  The ampulla was visualized and the prior biliary stent was intact.  The sphincterotome was used to cannulated the CBD along side of the stent.  The guidewire was secured in the right intrahepatic ducts. The stent was then removed with a snare.  A balloon cholangiogram was then performed and there was no evidece of any retained stones or bile duct leak.  The intrahepatic and extrahepatic ducts were normal caliber.  The CBD measured 4-5 mm.     The scope was then completely withdrawn from the patient and the procedure terminated.     COMPLICATIONS:  ENDOSCOPIC IMPRESSION: 1) No bile duct leak or retained stones.  RECOMMENDATIONS: 1) No further intervention required.    _______________________________ eSigned:  Carol Ada, MD 07/16/2014 11:42 AM   CC:

## 2014-07-16 NOTE — H&P (View-Only) (Signed)
S;  This 48 y.o. AA male has well controlled HTN and Type II DM. Pt recently hospitalized for cholecystectomy on 05/24/2014 (Dr. Georganna Skeans); A1c at admission= 6.0%. Post-op complication required ERCP on 05/25/2014 (Dr. Benson Norway).  Pt is at home now and recuperating but has little appetite; he eats several small meals daily. He is compliant with medications. He reports some weight loss (last known weight = ~325 lbs per pt).   Pt underwent thoracic spine surgery in Feb 1610 (Dr. Cathren Laine) in [place], [place]. Recent MRI of spine shows seromas; pt awaiting follow-up appt w/ neurosurgeon. He may require another procedure to drain fluid collections.   Patient Active Problem List   Diagnosis Date Noted  . Hypoxemia 05/23/2014  . Acute acalculous cholecystitis 05/21/2014  . Chronic anticoagulation 05/21/2014  . Spinal stenosis 12/02/2013  . Type II or unspecified type diabetes mellitus without mention of complication, not stated as uncontrolled 08/03/2013  . Venous stasis dermatitis 09/26/2012  . Encounter for long-term (current) use of anticoagulants 03/26/2012  . Acute venous embolism and thrombosis of deep vessels of proximal lower extremity 03/26/2012  . Spinal stenosis of lumbar region 03/06/2012  . HTN (hypertension) 03/06/2012  . Hx pulmonary embolism 03/06/2012  . Morbid obesity 03/06/2012  . Lower extremity edema 03/06/2012    Outpatient Encounter Prescriptions as of 06/18/2014  Medication Sig  . clotrimazole-betamethasone (LOTRISONE) cream Apply 1 application topically 2 (two) times daily.  Marland Kitchen docusate sodium 100 MG CAPS Take 100 mg by mouth 2 (two) times daily as needed for mild constipation.  Marland Kitchen ketoconazole (NIZORAL) 2 % shampoo apply 1 APPLICATION TOPICALLY once daily  . metFORMIN (GLUCOPHAGE) 500 MG tablet Take 1 tablet (500 mg total) by mouth 2 (two) times daily with a meal.  . metoprolol succinate (TOPROL-XL) 50 MG 24 hr tablet Take 1 tablet (50 mg total) by mouth daily.  .  multivitamin (ONE-A-DAY MEN'S) TABS tablet Take 1 tablet by mouth daily.  . polyethylene glycol (MIRALAX / GLYCOLAX) packet Take 17 g by mouth daily as needed.  . potassium chloride SA (KLOR-CON M20) 20 MEQ tablet Take 1 tablet (20 mEq total) by mouth 2 (two) times daily.  . sertraline (ZOLOFT) 100 MG tablet Take 1 and 1/2 tablets by mouth daily.  Marland Kitchen torsemide (DEMADEX) 20 MG tablet Take 1 tablet (20 mg total) by mouth daily.  . traZODone (DESYREL) 100 MG tablet Take 1 tablet (100 mg total) by mouth at bedtime.  Marland Kitchen warfarin (COUMADIN) 10 MG tablet Take 5-10 mg by mouth daily. Take 5mg  on Monday, Wednesday and Friday.  All other days of the week take 10mg .    Allergies  Allergen Reactions  . Lisinopril Other (See Comments)    cough  . Ace Inhibitors     cough    PMHx, Surg Hx, Soc and Fam Hx reviewed.  ROS: As per HPI.  O: Filed Vitals:   06/18/14 1412  BP: 110/58  Pulse: 73  Temp: 98.3 F (36.8 C)  Resp: 16   GEN: In NAD; seated in WC. HENT: Jansen/AT; EOMI w/ clear conj/sclerae. Otherwise unremarkable. COR: RRR. LUNGS: Unlabored resp. SKIN: W&D; intact. Lower extremities dry/scaly; no erythema. NEURO: A&O x 3; CNs intact. Nonfocal.   A/P: Type II or unspecified type diabetes mellitus without mention of complication, not stated as uncontrolled- DM is well controlled and pt has lost weight.  Essential hypertension- Stable and controlled. Continue current medications.  Need for prophylactic vaccination with combined diphtheria-tetanus-pertussis (DTP) vaccine - Plan: Tdap vaccine greater  than or equal to 7yo IM   Pt to follow-up with Dr. Jaynee Eagles office as scheduled; will keep me informed about neurosurgical status.

## 2014-07-16 NOTE — Interval H&P Note (Signed)
History and Physical Interval Note:  07/16/2014 9:09 AM  Nicolas [name]  has presented today for surgery, with the diagnosis of stent removal  The various methods of treatment have been discussed with the patient and family. After consideration of risks, benefits and other options for treatment, the patient has consented to  Procedure(s): ENDOSCOPIC RETROGRADE CHOLANGIOPANCREATOGRAPHY (ERCP) (N/A) as a surgical intervention .  The patient's history has been reviewed, patient examined, no change in status, stable for surgery.  I have reviewed the patient's chart and labs.  Questions were answered to the patient's satisfaction.     [name]

## 2014-07-16 NOTE — Transfer of Care (Signed)
Immediate Anesthesia Transfer of Care Note  Patient: Nicolas [name]  Procedure(s) Performed: Procedure(s): ENDOSCOPIC RETROGRADE CHOLANGIOPANCREATOGRAPHY (ERCP) (N/A)  Patient Location: PACU  Anesthesia Type:General  Level of Consciousness: awake, sedated and patient cooperative  Airway & Oxygen Therapy: Patient Spontanous Breathing and Patient connected to face mask oxygen  Post-op Assessment: Report given to PACU RN and Post -op Vital signs reviewed and stable  Post vital signs: Reviewed and stable  Complications: No apparent anesthesia complications

## 2014-07-16 NOTE — Discharge Instructions (Addendum)

## 2014-07-17 NOTE — Telephone Encounter (Signed)
Prescription for commode seat printed and signed (at Amy's desk).

## 2014-07-19 ENCOUNTER — Encounter (HOSPITAL_COMMUNITY): Payer: Self-pay | Admitting: Gastroenterology

## 2014-07-19 ENCOUNTER — Telehealth: Payer: Self-pay | Admitting: Radiology

## 2014-07-19 NOTE — Telephone Encounter (Signed)
I have faxed the order to [facility] since they are working with him, but I don't think they can do the DME, hopefully they can tell me where to send order, it is in my box.

## 2014-07-19 NOTE — Telephone Encounter (Signed)
Have Rx signed at [place], [facility] does not participate with his insurance [facility]) do you know who participates and can get him the commode seat?

## 2014-07-20 NOTE — Telephone Encounter (Signed)
Referrals emailed care [employer] and waiting on a response back from her about the status of this referral

## 2014-07-21 NOTE — Telephone Encounter (Signed)
Refaxed to another fax number, so they ca get DME patient needs

## 2014-07-22 ENCOUNTER — Ambulatory Visit (INDEPENDENT_AMBULATORY_CARE_PROVIDER_SITE_OTHER): Payer: Commercial Managed Care - HMO | Admitting: Cardiology

## 2014-07-22 DIAGNOSIS — Z86711 Personal history of pulmonary embolism: Secondary | ICD-10-CM

## 2014-07-22 DIAGNOSIS — Z7901 Long term (current) use of anticoagulants: Secondary | ICD-10-CM

## 2014-07-22 LAB — POCT INR: INR: 2

## 2014-07-27 NOTE — Telephone Encounter (Signed)
Pt LMOM asking for status of this order for raised commode seat. Do either of you know the status, and let pt know?

## 2014-07-27 NOTE — Telephone Encounter (Addendum)
He needs to contact his insurance company. He indicates [name] is the company that gets this order. I will reprint and refax to [place].

## 2014-07-28 NOTE — Telephone Encounter (Signed)
[facility] has sent this information to [facility] for this patient

## 2014-08-03 ENCOUNTER — Other Ambulatory Visit: Payer: Self-pay | Admitting: *Deleted

## 2014-08-03 MED ORDER — WARFARIN SODIUM 10 MG PO TABS: 10.0000 mg | ORAL_TABLET | ORAL | Status: DC

## 2014-08-12 ENCOUNTER — Ambulatory Visit (INDEPENDENT_AMBULATORY_CARE_PROVIDER_SITE_OTHER): Payer: Commercial Managed Care - HMO | Admitting: Cardiology

## 2014-08-12 DIAGNOSIS — Z7901 Long term (current) use of anticoagulants: Secondary | ICD-10-CM

## 2014-08-12 DIAGNOSIS — Z86711 Personal history of pulmonary embolism: Secondary | ICD-10-CM

## 2014-08-12 LAB — POCT INR: INR: 3.5

## 2014-08-18 ENCOUNTER — Telehealth: Payer: Self-pay | Admitting: Family Medicine

## 2014-08-18 DIAGNOSIS — M4804 Spinal stenosis, thoracic region: Secondary | ICD-10-CM

## 2014-08-18 DIAGNOSIS — M48061 Spinal stenosis, lumbar region without neurogenic claudication: Secondary | ICD-10-CM

## 2014-08-18 NOTE — Telephone Encounter (Addendum)
Home health nurse Lorel Monaco wants an order for an elevated toilet seat for patient. Please call Kasse at [phone]

## 2014-08-19 NOTE — Telephone Encounter (Signed)
I signed the order for the Elevated commode seat. I have signed it and it can be faxed to [facility] but we need a fax number. Call Kasse at [phone] to get fax number.  Thank you.

## 2014-08-19 NOTE — Telephone Encounter (Addendum)
Pt has had difficulty with ambulating. PT is going to start transferring him off and on the commode. Home health nurse feels it would be much easier for pt if commode was not so low.  Okay for order? I have pended the order.

## 2014-08-20 ENCOUNTER — Telehealth: Payer: Self-pay

## 2014-08-20 DIAGNOSIS — M48061 Spinal stenosis, lumbar region without neurogenic claudication: Secondary | ICD-10-CM

## 2014-08-20 DIAGNOSIS — M4804 Spinal stenosis, thoracic region: Secondary | ICD-10-CM

## 2014-08-20 NOTE — Telephone Encounter (Signed)
kassie from [facility] called back to request an order also for a walker for patient. Per Laurette Schimke one of the legs broke off today while patient was using it with physical therapy.  Order must have patients weight and height included in the order-height 5'10 and weight 325 lbs. Kassie at [employer] call back number [phone]. The fax number to send this order for the walker is [phone]

## 2014-08-20 NOTE — Telephone Encounter (Signed)
Spoke to [employer].  She will get the fax number and call us back.

## 2014-08-20 NOTE — Telephone Encounter (Signed)
kassie from [facility] calling back to give fax number [phone] to send the order for Elavated commode seat. kassie's call back number [phone]

## 2014-08-20 NOTE — Telephone Encounter (Signed)
kassie from [facility] calling back to give fax number [phone] to send the order for Elavated commode seat. kassie's call back number  [phone]

## 2014-08-20 NOTE — Telephone Encounter (Signed)
faxed

## 2014-08-21 NOTE — Telephone Encounter (Signed)
Have sent order for elevated commode multiple times. Pended order for the walker

## 2014-08-22 NOTE — Telephone Encounter (Signed)
I have signed order; printed RX is at Amy's work station at 104. I wrote his height and weight on the order.

## 2014-08-23 ENCOUNTER — Telehealth: Payer: Self-pay

## 2014-08-23 NOTE — Telephone Encounter (Signed)
Rosalie from [facility] called to let you know that they did a PTINR on this pt today. PT was 42.7 and INR was 3.6 and his last dose of Coumadin was 10mg  last night.

## 2014-08-24 NOTE — Telephone Encounter (Signed)
We do not manage Coumadin; that information needs to go to pt's cardiology office- Coumadin clinic. [facility] has been notified of this numerous times. They are supposed to have that information.

## 2014-08-25 NOTE — Telephone Encounter (Signed)
Spoke to [employer] with [place]. She is contacting his case manager to make sure the providers are correct in pt chart. Advised her Coumadin [facility] is managing coumadin.

## 2014-08-26 ENCOUNTER — Ambulatory Visit (INDEPENDENT_AMBULATORY_CARE_PROVIDER_SITE_OTHER): Payer: Commercial Managed Care - HMO | Admitting: Internal Medicine

## 2014-08-26 DIAGNOSIS — I824Y3 Acute embolism and thrombosis of unspecified deep veins of proximal lower extremity, bilateral: Secondary | ICD-10-CM

## 2014-08-26 DIAGNOSIS — Z86711 Personal history of pulmonary embolism: Secondary | ICD-10-CM

## 2014-08-26 LAB — POCT INR: INR: 1.4

## 2014-09-07 ENCOUNTER — Telehealth: Payer: Self-pay | Admitting: *Deleted

## 2014-09-07 NOTE — Telephone Encounter (Signed)
Faxed signed order to [employer], per Dr Leward Quan.

## 2014-09-08 ENCOUNTER — Ambulatory Visit (INDEPENDENT_AMBULATORY_CARE_PROVIDER_SITE_OTHER): Payer: Commercial Managed Care - HMO | Admitting: Pharmacist

## 2014-09-08 DIAGNOSIS — Z86711 Personal history of pulmonary embolism: Secondary | ICD-10-CM

## 2014-09-08 DIAGNOSIS — I824Y3 Acute embolism and thrombosis of unspecified deep veins of proximal lower extremity, bilateral: Secondary | ICD-10-CM

## 2014-09-08 DIAGNOSIS — R600 Localized edema: Secondary | ICD-10-CM

## 2014-09-08 LAB — POCT INR: INR: 2.9

## 2014-09-17 ENCOUNTER — Ambulatory Visit: Payer: Commercial Managed Care - HMO | Admitting: Family Medicine

## 2014-09-29 ENCOUNTER — Telehealth: Payer: Self-pay

## 2014-09-29 ENCOUNTER — Ambulatory Visit: Payer: Commercial Managed Care - HMO | Admitting: Family Medicine

## 2014-09-29 DIAGNOSIS — M4716 Other spondylosis with myelopathy, lumbar region: Secondary | ICD-10-CM

## 2014-09-29 DIAGNOSIS — R29898 Other symptoms and signs involving the musculoskeletal system: Secondary | ICD-10-CM

## 2014-09-29 DIAGNOSIS — M4714 Other spondylosis with myelopathy, thoracic region: Secondary | ICD-10-CM

## 2014-09-29 NOTE — Telephone Encounter (Signed)
Printed order

## 2014-09-29 NOTE — Telephone Encounter (Signed)
[employer] called to say they needed an order for this pt faxed to [phone] for a bilateral lower extremity orthotic bracing consult. Do you mind doing this for me please. Thanks so much

## 2014-09-30 ENCOUNTER — Ambulatory Visit (INDEPENDENT_AMBULATORY_CARE_PROVIDER_SITE_OTHER): Payer: Commercial Managed Care - HMO | Admitting: Interventional Cardiology

## 2014-09-30 DIAGNOSIS — Z86711 Personal history of pulmonary embolism: Secondary | ICD-10-CM

## 2014-09-30 LAB — POCT INR: INR: 2.8

## 2014-10-14 ENCOUNTER — Telehealth: Payer: Self-pay

## 2014-10-14 DIAGNOSIS — M48061 Spinal stenosis, lumbar region without neurogenic claudication: Secondary | ICD-10-CM

## 2014-10-14 DIAGNOSIS — R600 Localized edema: Secondary | ICD-10-CM

## 2014-10-14 DIAGNOSIS — I872 Venous insufficiency (chronic) (peripheral): Secondary | ICD-10-CM

## 2014-10-14 DIAGNOSIS — M4804 Spinal stenosis, thoracic region: Secondary | ICD-10-CM

## 2014-10-14 NOTE — Telephone Encounter (Signed)
I have printed RX: [facility], dispensing. I spoke with Eustaquio Boyden about prescription; we will mail prescription to pt.

## 2014-10-14 NOTE — Telephone Encounter (Signed)
Dr.Mcpherson, Eustaquio Boyden a nurse case manager with [facility], would like to know if you could write an rx for the pt for a electric recliner.   [phone] (Nurse case manager Eustaquio Boyden)

## 2014-10-18 ENCOUNTER — Ambulatory Visit (INDEPENDENT_AMBULATORY_CARE_PROVIDER_SITE_OTHER): Payer: Commercial Managed Care - HMO

## 2014-10-18 DIAGNOSIS — Z86711 Personal history of pulmonary embolism: Secondary | ICD-10-CM

## 2014-10-18 LAB — POCT INR: INR: 2.1

## 2014-10-19 ENCOUNTER — Ambulatory Visit (INDEPENDENT_AMBULATORY_CARE_PROVIDER_SITE_OTHER): Payer: Commercial Managed Care - HMO | Admitting: Family Medicine

## 2014-10-19 ENCOUNTER — Encounter: Payer: Self-pay | Admitting: Family Medicine

## 2014-10-19 VITALS — BP 130/68 | HR 105 | Temp 99.1°F | Resp 16

## 2014-10-19 DIAGNOSIS — M1990 Unspecified osteoarthritis, unspecified site: Secondary | ICD-10-CM

## 2014-10-19 DIAGNOSIS — I10 Essential (primary) hypertension: Secondary | ICD-10-CM

## 2014-10-19 DIAGNOSIS — E119 Type 2 diabetes mellitus without complications: Secondary | ICD-10-CM

## 2014-10-19 DIAGNOSIS — Z23 Encounter for immunization: Secondary | ICD-10-CM

## 2014-10-19 LAB — CBC WITH DIFFERENTIAL/PLATELET
Basophils Absolute: 0 10*3/uL (ref 0.0–0.1)
Basophils Relative: 0 % (ref 0–1)
Eosinophils Absolute: 0 10*3/uL (ref 0.0–0.7)
Eosinophils Relative: 0 % (ref 0–5)
HCT: 43.5 % (ref 39.0–52.0)
Hemoglobin: 14.2 g/dL (ref 13.0–17.0)
Lymphocytes Relative: 17 % (ref 12–46)
Lymphs Abs: 0.7 10*3/uL (ref 0.7–4.0)
MCH: 27.3 pg (ref 26.0–34.0)
MCHC: 32.6 g/dL (ref 30.0–36.0)
MCV: 83.7 fL (ref 78.0–100.0)
MPV: 10 fL (ref 9.4–12.4)
Monocytes Absolute: 0.7 10*3/uL (ref 0.1–1.0)
Monocytes Relative: 17 % — ABNORMAL HIGH (ref 3–12)
Neutro Abs: 2.6 10*3/uL (ref 1.7–7.7)
Neutrophils Relative %: 66 % (ref 43–77)
Platelets: 201 10*3/uL (ref 150–400)
RBC: 5.2 MIL/uL (ref 4.22–5.81)
RDW: 16.3 % — ABNORMAL HIGH (ref 11.5–15.5)
WBC: 4 10*3/uL (ref 4.0–10.5)

## 2014-10-19 LAB — COMPLETE METABOLIC PANEL WITH GFR
ALT: 19 U/L (ref 0–53)
AST: 27 U/L (ref 0–37)
Albumin: 3.8 g/dL (ref 3.5–5.2)
Alkaline Phosphatase: 58 U/L (ref 39–117)
BUN: 9 mg/dL (ref 6–23)
CO2: 26 mEq/L (ref 19–32)
Calcium: 8.5 mg/dL (ref 8.4–10.5)
Chloride: 99 mEq/L (ref 96–112)
Creat: 0.73 mg/dL (ref 0.50–1.35)
GFR, Est African American: >89 mL/min
GFR, Est Non African American: >89 mL/min
Glucose, Bld: 77 mg/dL (ref 70–99)
Potassium: 3.6 mEq/L (ref 3.5–5.3)
Sodium: 135 mEq/L (ref 135–145)
Total Bilirubin: 1 mg/dL (ref 0.2–1.2)
Total Protein: 7.5 g/dL (ref 6.0–8.3)

## 2014-10-19 LAB — POCT GLYCOSYLATED HEMOGLOBIN (HGB A1C): Hemoglobin A1C: 5.6

## 2014-10-19 MED ORDER — METOPROLOL SUCCINATE ER 50 MG PO TB24: 50.0000 mg | ORAL_TABLET | Freq: Every evening | ORAL | Status: DC

## 2014-10-19 MED ORDER — SERTRALINE HCL 100 MG PO TABS: 150.0000 mg | ORAL_TABLET | Freq: Every day | ORAL | Status: DC

## 2014-10-19 MED ORDER — TRAZODONE HCL 100 MG PO TABS: 100.0000 mg | ORAL_TABLET | Freq: Every day | ORAL | Status: DC

## 2014-10-19 MED ORDER — DICLOFENAC SODIUM 75 MG PO TBEC: 75.0000 mg | DELAYED_RELEASE_TABLET | Freq: Two times a day (BID) | ORAL | Status: DC

## 2014-10-19 MED ORDER — METFORMIN HCL 500 MG PO TABS: 500.0000 mg | ORAL_TABLET | Freq: Every day | ORAL | Status: DC

## 2014-10-19 NOTE — Patient Instructions (Addendum)
Acetaminophen biphasic or extended-release tablets What is this medicine? ACETAMINOPHEN (a set a MEE noe fen) is a pain reliever. It is used to treat mild pain and fever. This medicine may be used for other purposes; ask your health care provider or pharmacist if you have questions. COMMON BRAND NAME(S): Mapap Arthritis Pain, Tylenol 8 Hour, Tylenol Arthritis Pain What should I tell my health care provider before I take this medicine? They need to know if you have any of these conditions: -if you often drink alcohol -liver disease -an unusual or allergic reaction to acetaminophen, other medicines, foods, dyes, or preservatives -pregnant or trying to get pregnant -breast-feeding How should I use this medicine? Take this medicine by mouth with a glass of water. Follow the directions on the package or prescription label. Do not cut, crush or chew this medicine. Take your medicine at regular intervals. Do not take your medicine more often than directed. Talk to your pediatrician regarding the use of this medicine in children. While this drug may be prescribed for children as young as 47 years of age for selected conditions, precautions do apply. Overdosage: If you think you have taken too much of this medicine contact a poison control center or emergency room at once. NOTE: This medicine is only for you. Do not share this medicine with others. What if I miss a dose? If you miss a dose, take it as soon as you can. If it is almost time for your next dose, take only that dose. Do not take double or extra doses. What may interact with this medicine? -alcohol -imatinib -isoniazid -other medicines with acetaminophen This list may not describe all possible interactions. Give your health care provider a list of all the medicines, herbs, non-prescription drugs, or dietary supplements you use. Also tell them if you smoke, drink alcohol, or use illegal drugs. Some items may interact with your  medicine. What should I watch for while using this medicine? Tell your doctor or health care professional if the pain lasts more than 10 days (5 days for children), if it gets worse, or if there is a new or different kind of pain. Also, check with your doctor if a fever lasts for more than 3 days. Do not take other medicines that contain acetaminophen with this medicine. Always read labels carefully. If you have questions, ask your doctor or pharmacist. If you take too much acetaminophen get medical help right away. Too much acetaminophen can be very dangerous and cause liver damage. Even if you do not have symptoms, it is important to get help right away. What side effects may I notice from receiving this medicine? Side effects that you should report to your doctor or health care professional as soon as possible: -allergic reactions like skin rash, itching or hives, swelling of the face, lips, or tongue -breathing problems -fever or sore throat -redness, blistering, peeling or loosening of the skin, including inside the mouth -trouble passing urine or change in the amount of urine -unusual bleeding or bruising -unusually weak or tired -yellowing of the eyes or skin Side effects that usually do not require medical attention (report to your doctor or health care professional if they continue or are bothersome): -headache -nausea, vomiting This list may not describe all possible side effects. Call your doctor for medical advice about side effects. You may report side effects to FDA at 1-800-FDA-1088. Where should I keep my medicine? Keep out of reach of children. Store at room temperature between 20 and  25 degrees C (68 and 77 degrees F). Protect from moisture and heat. Throw away any unused medicine after the expiration date. NOTE: This sheet is a summary. It may not cover all possible information. If you have questions about this medicine, talk to your doctor, pharmacist, or health care  provider.  2015, Elsevier/Gold Standard. (2013-07-06 12:56:12)   I suggest you take Extra-Strength Tylenol 2 tablets 2-3 times daily to reduce joint pain. I also recommend Fish Oil supplement; this is available over-the-counter and has some anti-inflammatory benefits. Take 1000 - 2000 mg daily. Take this supplement by itself so that it does not interfere with other solid pills and capsules.  I have given you samples of MegaRed Omega-3 Krill Oil capsules; start with 1 capsule daily.  We discussed COLONOSCOPY which is recommended after age 47; this screening test is advised at earlier age if you are having change in stools or abdominal pain, family history of colon cancer or polyps.

## 2014-10-20 ENCOUNTER — Encounter: Payer: Self-pay | Admitting: Family Medicine

## 2014-10-20 NOTE — Progress Notes (Signed)
Subjective:    Patient ID: Nicolas [name], male    DOB: Dec 19, 1965, 48 y.o.   MRN: [id number]  HPI  This 48 y.o. AA male is here for Type II DM follow-up. His only medication for this condition is Metformin 500 mg 1 tablet daily. Pt is not taking Insulin. Checking FSBS less than once a day; no significant change in lifestyle/nutrition but is trying to be more active.  Weight loss unknown since pt unable to bear weight at this time. [employer] nurse case manager involved w/ home care; requests for electric recliner has been mailed to pt's home.  HTN- Well controlled w/ excellent medication compliance. No reported adverse effects.  Arthritis- Pt c/o joint pain, especially in back, hips and knees. He is not taking any narcotic pain medication and Tramadol was ineffective in the past. Chronic warfarin therapy prohibits several medications (NSAIDs).   Patient Active Problem List   Diagnosis Date Noted  . Hypoxemia 05/23/2014  . Acute acalculous cholecystitis 05/21/2014  . Chronic anticoagulation 05/21/2014  . Spinal stenosis 12/02/2013  . Type II or unspecified type diabetes mellitus without mention of complication, not stated as uncontrolled 08/03/2013  . Venous stasis dermatitis 09/26/2012  . Encounter for long-term (current) use of anticoagulants 03/26/2012  . Acute venous embolism and thrombosis of deep vessels of proximal lower extremity 03/26/2012  . Spinal stenosis of lumbar region 03/06/2012  . HTN (hypertension) 03/06/2012  . Hx pulmonary embolism 03/06/2012  . Morbid obesity 03/06/2012  . Lower extremity edema 03/06/2012    Prior to Admission medications   Medication Sig Start Date End Date Taking? Authorizing Provider  metFORMIN (GLUCOPHAGE) 500 MG tablet Take 1 tablet (500 mg total) by mouth daily. 08/06/2012  Yes Barton Fanny, MD  metoprolol succinate (TOPROL-XL) 50 MG 24 hr tablet Take 1 tablet (50 mg total) by mouth every evening. Take with or immediately following a  meal. 03/04/2012  Yes Barton Fanny, MD  Multiple Vitamin (MULTIVITAMIN WITH MINERALS) TABS tablet Take 1 tablet by mouth daily.   Yes Historical Provider, MD  sertraline (ZOLOFT) 100 MG tablet Take 1.5 tablets (150 mg total) by mouth at bedtime. 08/26/2012  Yes Barton Fanny, MD  warfarin (COUMADIN) 10 MG tablet Take 1 tablet (10 mg total) by mouth as directed. 08/03/14  Yes Burnell Blanks, MD    History   Social History  . Marital Status: Single    Spouse Name: N/A    Number of Children: 3  . Years of Education: 10   Occupational History  . Disability    Social History Main Topics  . Smoking status: Former Smoker -- 1.50 packs/day for 10 years    Types: Cigarettes    Quit date: 04/26/2001  . Smokeless tobacco: Never Used  . Alcohol Use: 0.5 oz/week    1 drink(s) per week     Comment: very little  . Drug Use: No  . Sexual Activity: No   Other Topics Concern  . Not on file   Social History Narrative   Patient is single and lives at home, his daughter lives with him.   Patient has three children.   Patient is disabled.   Patient has a 10 grade education.   Patient is right-handed.   Patient drinks 1 or 2 sodas and tea per week.    Review of Systems  Constitutional: Positive for activity change. Negative for fever, diaphoresis, appetite change, fatigue and unexpected weight change.  Eyes: Negative.   Respiratory:  Negative.   Cardiovascular: Negative.   Musculoskeletal: Positive for myalgias, back pain, arthralgias and gait problem. Negative for neck pain.  Neurological: Positive for weakness and numbness. Negative for dizziness, syncope, light-headedness and headaches.       Some numbness and mild- moderate weakness in lower extremities.  Psychiatric/Behavioral: Positive for sleep disturbance. Negative for suicidal ideas, behavioral problems, confusion and dysphoric mood. The patient is not nervous/anxious.       Objective:   Physical Exam    Constitutional: He is oriented to person, place, and time. He appears well-developed and well-nourished. No distress.  Seated comfortably in scooter.  HENT:  Head: Normocephalic and atraumatic.  Right Ear: External ear normal.  Left Ear: External ear normal.  Nose: Nose normal.  Mouth/Throat: Oropharynx is clear and moist.  Eyes: Conjunctivae and EOM are normal. Pupils are equal, round, and reactive to light. No scleral icterus.  Neck: Normal range of motion. Neck supple.  Cardiovascular: Normal rate and regular rhythm.   Pulmonary/Chest: Effort normal. No respiratory distress.  Musculoskeletal: He exhibits edema.  Able to move lower legs to put on socks and shoes.  Neurological: He is alert and oriented to person, place, and time. No cranial nerve deficit. Coordination normal.  Skin: Skin is warm and dry. He is not diaphoretic.  See DM FOOT EXAM.  Psychiatric: He has a normal mood and affect. His behavior is normal. Judgment and thought content normal.  Nursing note and vitals reviewed.   A1c= 5.6%     Assessment & Plan:  Diabetes mellitus without complication - Normalized A1c on once-a-day Metformin 500 mg. Plan: HM Diabetes Foot Exam, COMPLETE METABOLIC PANEL WITH GFR, CBC with Differential, POCT glycosylated hemoglobin (Hb A1C)  Essential hypertension - Stable on current medication. Continue the same. Plan: COMPLETE METABOLIC PANEL WITH GFR, CBC with Differential  Arthritis- Chronic joint pain often associated with Diabetes/obesity (increased inflammatory state) and sedentary lifestyle. Recommend Extra-strength Tylenol 2 tablets twice a day. Try Omega-3 Fish Oil capsules 1000-1200 mg 1 cap daily.  Need for prophylactic vaccination and inoculation against influenza - Plan: Flu Vaccine QUAD 36+ mos IM   Meds ordered this encounter  Medications  . sertraline (ZOLOFT) 100 MG tablet    Sig: Take 1.5 tablets (150 mg total) by mouth at bedtime.    Dispense:  46 tablet     Refill:  5  . metoprolol succinate (TOPROL-XL) 50 MG 24 hr tablet    Sig: Take 1 tablet (50 mg total) by mouth every evening. Take with or immediately following a meal.    Dispense:  30 tablet    Refill:  5  . metFORMIN (GLUCOPHAGE) 500 MG tablet    Sig: Take 1 tablet (500 mg total) by mouth daily.    Dispense:  30 tablet    Refill:  5

## 2014-10-26 ENCOUNTER — Telehealth: Payer: Self-pay | Admitting: Neurology

## 2014-10-27 NOTE — Telephone Encounter (Signed)
Notification received from Young Harris, that despite numerous trips to the patients house for adjustments, he says that CPAP is uncomfortable.  He is requesting that [name] come an pick up the machine because he is not using it at the present time.  [name] is requesting a d/c order so that they can pick up the equipment.

## 2014-10-27 NOTE — Telephone Encounter (Signed)
Can he come to the sleep lab first- ?i would like to see david and him go through his equipment and fit- CD

## 2014-10-29 ENCOUNTER — Encounter: Payer: Self-pay | Admitting: Cardiovascular Disease

## 2014-10-29 ENCOUNTER — Ambulatory Visit (INDEPENDENT_AMBULATORY_CARE_PROVIDER_SITE_OTHER): Payer: Commercial Managed Care - HMO | Admitting: Cardiovascular Disease

## 2014-10-29 VITALS — BP 130/68 | HR 62 | Ht 71.0 in | Wt 298.0 lb

## 2014-10-29 DIAGNOSIS — R600 Localized edema: Secondary | ICD-10-CM

## 2014-10-29 DIAGNOSIS — Z86711 Personal history of pulmonary embolism: Secondary | ICD-10-CM

## 2014-10-29 NOTE — Patient Instructions (Signed)
Your physician wants you to follow-up in:  12 months.  You will receive a reminder letter in the mail two months in advance. If you don't receive a letter, please call our office to schedule the follow-up appointment.   

## 2014-10-29 NOTE — Progress Notes (Signed)
History of Present Illness: 48 yo male with history of HTN, PE on chronic coumadin therapy, spinal stenosis here today for follow up. I saw him as a new patient 03/24/12 for evaluation of lower extremity edema. He was discharged from a rehab facility on 02/28/12 after spine surgery which was performed at [facility] in [place], [place] in January 2013. The pt reports total of 6 back surgeries for spinal stenosis ( spine disorder first diagnosed in 2004). He has a history of DVT in the summer 2012. He has history of PE in 2005, 2008, 2010. He has been on coumadin since summer 2012. He had an IVC filter placed in December 2012 when his blood thinner was stopped.  He had newly diagnosed HTN after the surgery along with fluid retention and lower extremity edema. He had been on Demadex 20 mg po BID since February 2013.  I suspected that he has chronic venous stasis disease. I ordered repeat venous dopplers and an echo. His echo showed normal LV size and function with LVEF of 55-60% with mild LVH and mild dilation of the left atrium. His venous dopplers May 2013  showed no evidence of DVT.   He has no new complaints today. Still has swelling in the left leg. No chest pain or SOB. His weight has been stable.   Primary Care Physician: Ellsworth Lennox  Past Medical History  Diagnosis Date  . Arthritis   . Clotting disorder   . Hypertension   . Pulmonary embolism     x 3 in 2005, 2008, 2010  . DVT (deep venous thrombosis)     Summer 2012  . Spinal stenosis   . Depression   . Diabetes mellitus without complication   . Sleep apnea   . History of home oxygen therapy     uses mouthpiece ventilation with 2 liters supplemental oxygen all the time    Past Surgical History  Procedure Laterality Date  . Cholecystectomy N/A 05/24/2014    Procedure: LAPAROSCOPIC CHOLECYSTECTOMY;  Surgeon: Zenovia Jarred, MD;  Location: [place];  Service: General;  Laterality: N/A;  . Ercp N/A 05/25/2014   Procedure: ENDOSCOPIC RETROGRADE CHOLANGIOPANCREATOGRAPHY (ERCP);  Surgeon: Beryle Beams, MD;  Location: [place];  Service: Endoscopy;  Laterality: N/A;  . Spine surgery  11/30/11    Total 6 back surgeries  . Spine surgery  20/3559    Dr. Cathren Laine in [place], [place]  . Ivp filter  jan 2012  . Ercp N/A 07/16/2014    Procedure: ENDOSCOPIC RETROGRADE CHOLANGIOPANCREATOGRAPHY (ERCP);  Surgeon: Beryle Beams, MD;  Location: [facility];  Service: Endoscopy;  Laterality: N/A;    Current Outpatient Prescriptions  Medication Sig Dispense Refill  . metFORMIN (GLUCOPHAGE) 500 MG tablet Take 1 tablet (500 mg total) by mouth daily. 30 tablet 5  . metoprolol succinate (TOPROL-XL) 50 MG 24 hr tablet Take 1 tablet (50 mg total) by mouth every evening. Take with or immediately following a meal. 30 tablet 5  . Multiple Vitamin (MULTIVITAMIN WITH MINERALS) TABS tablet Take 1 tablet by mouth daily.    . sertraline (ZOLOFT) 100 MG tablet Take 1.5 tablets (150 mg total) by mouth at bedtime. 46 tablet 5  . warfarin (COUMADIN) 10 MG tablet Take 1 tablet (10 mg total) by mouth as directed. 30 tablet 3   No current facility-administered medications for this visit.    Allergies  Allergen Reactions  . Lisinopril Other (See Comments)    cough  . Ace Inhibitors  cough    History   Social History  . Marital Status: Single    Spouse Name: N/A    Number of Children: 3  . Years of Education: 10   Occupational History  . Disability    Social History Main Topics  . Smoking status: Former Smoker -- 1.50 packs/day for 10 years    Types: Cigarettes    Quit date: 04/26/2001  . Smokeless tobacco: Never Used  . Alcohol Use: 0.5 oz/week    1 drink(s) per week     Comment: very little  . Drug Use: No  . Sexual Activity: No   Other Topics Concern  . Not on file   Social History Narrative   Patient is single and lives at home, his daughter lives with him.   Patient has three children.   Patient is  disabled.   Patient has a 10 grade education.   Patient is right-handed.   Patient drinks 1 or 2 sodas and tea per week.    Family History  Problem Relation Age of Onset  . Cancer Mother 65    Lung  . Diabetes Father   . Prostate cancer Father   . Hypertension Father   . Hypertension Brother   . Hypertension Brother   . Diabetes Brother   . Heart attack Neg Hx   . Stroke Neg Hx     Review of Systems:  As stated in the HPI and otherwise negative.   BP 130/68 mmHg  Pulse 62  Ht 5\' 11"  (1.803 m)  Wt 298 lb (135.172 kg)  BMI 41.58 kg/m2  SpO2 97%  Physical Examination: General: Well developed, well nourished, NAD HEENT: OP clear, mucus membranes moist SKIN: warm, dry. No rashes. Neuro: No focal deficits Musculoskeletal: Muscle strength 5/5 all ext Psychiatric: Mood and affect normal Neck: No JVD, no carotid bruits, no thyromegaly, no lymphadenopathy. Lungs:Clear bilaterally, no wheezes, rhonci, crackles Cardiovascular: Regular rate and rhythm. No murmurs, gallops or rubs. Abdomen:Soft. Bowel sounds present. Non-tender.  Extremities: 2+ right  lower extremity edema. Trace to 1+ edema left lower ext. Pulses are hard to palpate secondary to pt size. Venous stasis changes both lower ext.   Echo 04/07/12:  Left ventricle: The cavity size was normal. Wall thickness was increased in a pattern of mild LVH. Systolic function was normal. The estimated ejection fraction was in the range of 55% to 60%. Wall motion was normal; there were no regional wall motion abnormalities. Left ventricular diastolic function parameters were normal. - Left atrium: The atrium was mildly dilated.  Assessment and Plan:   1. History of pulmonary embolism: He will need lifelong anti-coagulation. No evidence of DVT on lower extremity venous dopplers  May 2013. He has been therapeutic on coumadin.  He is followed in our coumadin clinic. I see him once yearly since he is followed in our coumadin clinic.     2. Lower extremity edema: Likely secondary to venous stasis disease and chronic DVT. Will continue coumadin for lifetime given his history of DVT, PE and immobility. He has an IVC filter in place. He has only been using Lasix prn. I have encouraged him to use more often if his weight increases or his LE edema increases. Elevate legs when sitting. I have nothing to offer and I suspect his venous damage from his recurrent DVT is irreversible. He could consider evaluation in the Vein clinic if he wishes.

## 2014-11-23 ENCOUNTER — Other Ambulatory Visit: Payer: Self-pay | Admitting: Cardiovascular Disease

## 2014-11-23 MED ORDER — WARFARIN SODIUM 10 MG PO TABS
10.0000 mg | ORAL_TABLET | ORAL | Status: DC
Start: 2014-11-23 — End: 2014-12-31

## 2014-11-23 NOTE — Telephone Encounter (Signed)
This encounter was created in error - please disregard.

## 2014-11-23 NOTE — Telephone Encounter (Signed)
New Prob   Pt is requesting a refill of Coumadin sent to [facility] on [address].

## 2014-11-23 NOTE — Telephone Encounter (Signed)
Rx sent for 1 month only as pt missed last appt and is not rescheduled until 1/4

## 2014-11-29 ENCOUNTER — Ambulatory Visit (INDEPENDENT_AMBULATORY_CARE_PROVIDER_SITE_OTHER): Payer: Commercial Managed Care - HMO | Admitting: *Deleted

## 2014-11-29 DIAGNOSIS — M4804 Spinal stenosis, thoracic region: Secondary | ICD-10-CM | POA: Diagnosis not present

## 2014-11-29 DIAGNOSIS — I824Y3 Acute embolism and thrombosis of unspecified deep veins of proximal lower extremity, bilateral: Secondary | ICD-10-CM

## 2014-11-29 DIAGNOSIS — Z86711 Personal history of pulmonary embolism: Secondary | ICD-10-CM | POA: Diagnosis not present

## 2014-11-29 DIAGNOSIS — G8220 Paraplegia, unspecified: Secondary | ICD-10-CM | POA: Diagnosis not present

## 2014-11-29 LAB — POCT INR: INR: 1.9

## 2014-12-02 ENCOUNTER — Telehealth: Payer: Self-pay | Admitting: *Deleted

## 2014-12-02 NOTE — Telephone Encounter (Signed)
Faxed signed form to [employer] [facility], per Dr Leward Quan. Confirmation page received at 4:28 pm.

## 2014-12-08 ENCOUNTER — Telehealth: Payer: Self-pay

## 2014-12-08 DIAGNOSIS — J986 Disorders of diaphragm: Secondary | ICD-10-CM | POA: Diagnosis not present

## 2014-12-08 DIAGNOSIS — J9610 Chronic respiratory failure, unspecified whether with hypoxia or hypercapnia: Secondary | ICD-10-CM | POA: Diagnosis not present

## 2014-12-08 NOTE — Telephone Encounter (Signed)
Opal Sidles from [facility] Management states she made a home visit with the patient today and she wants to do medication reconciliation with Dr. Leward Quan. She also wants to know which opthamalogist she recommends because the patient wants to see an eye doctor. Cb# [phone].

## 2014-12-10 NOTE — Telephone Encounter (Signed)
Nicolas [name] and reported that the pt is taking his sertraline, metoprolol and just QD dosing of diclofenac. Pt advised he is NOT taking his metformin bc his A1C has been so good. He has also not been checking his BS regularly. Pt reports he is also taking his warfarin and I verified that this is managed by his cardiologist. I gave Opal Sidles Dr Zenia Resides # since we refer to him a lot, but will be happy to call her back w/other names if you prefer another ophthalmologist.

## 2014-12-10 NOTE — Telephone Encounter (Signed)
Appreciate reconciliation. Medication list is complete as reported. Last A1c= 5.6% in Nov 2015. If pt is maintaining  Normal blood sugars, then he can remain off Metformin for now. Referral to Dr. Katy Fitch if fine with me. Thank you.

## 2014-12-30 DIAGNOSIS — M4804 Spinal stenosis, thoracic region: Secondary | ICD-10-CM | POA: Diagnosis not present

## 2014-12-30 DIAGNOSIS — G8220 Paraplegia, unspecified: Secondary | ICD-10-CM | POA: Diagnosis not present

## 2014-12-31 ENCOUNTER — Ambulatory Visit (INDEPENDENT_AMBULATORY_CARE_PROVIDER_SITE_OTHER): Payer: Commercial Managed Care - HMO | Admitting: *Deleted

## 2014-12-31 ENCOUNTER — Telehealth: Payer: Self-pay | Admitting: *Deleted

## 2014-12-31 DIAGNOSIS — I824Y3 Acute embolism and thrombosis of unspecified deep veins of proximal lower extremity, bilateral: Secondary | ICD-10-CM | POA: Diagnosis not present

## 2014-12-31 DIAGNOSIS — Z86711 Personal history of pulmonary embolism: Secondary | ICD-10-CM

## 2014-12-31 LAB — POCT INR: INR: 2.1

## 2014-12-31 MED ORDER — WARFARIN SODIUM 10 MG PO TABS: 10.0000 mg | ORAL_TABLET | ORAL | Status: DC

## 2014-12-31 NOTE — Telephone Encounter (Signed)
Faxed Rx to [facility] for non-invasive ventilator to be picked up because patient not using device, per Dr Leward Quan. Confirmation page received at 4:31 pm.

## 2015-01-03 ENCOUNTER — Other Ambulatory Visit: Payer: Self-pay | Admitting: *Deleted

## 2015-01-03 MED ORDER — WARFARIN SODIUM 10 MG PO TABS: 10.0000 mg | ORAL_TABLET | ORAL | Status: DC

## 2015-01-06 ENCOUNTER — Other Ambulatory Visit: Payer: Self-pay

## 2015-01-06 MED ORDER — TRAZODONE HCL 100 MG PO TABS: 100.0000 mg | ORAL_TABLET | Freq: Every day | ORAL | Status: DC

## 2015-01-06 MED ORDER — DICLOFENAC SODIUM 75 MG PO TBEC
75.0000 mg | DELAYED_RELEASE_TABLET | Freq: Two times a day (BID) | ORAL | Status: DC
Start: 2015-01-06 — End: 2015-08-30

## 2015-01-06 MED ORDER — METOPROLOL SUCCINATE ER 50 MG PO TB24: 50.0000 mg | ORAL_TABLET | Freq: Every evening | ORAL | Status: DC

## 2015-01-06 MED ORDER — SERTRALINE HCL 100 MG PO TABS: 150.0000 mg | ORAL_TABLET | Freq: Every day | ORAL | Status: DC

## 2015-01-06 MED ORDER — METFORMIN HCL 500 MG PO TABS: 500.0000 mg | ORAL_TABLET | Freq: Every day | ORAL | Status: DC

## 2015-01-07 ENCOUNTER — Other Ambulatory Visit: Payer: Self-pay

## 2015-01-07 DIAGNOSIS — M4800 Spinal stenosis, site unspecified: Secondary | ICD-10-CM | POA: Diagnosis not present

## 2015-01-07 NOTE — Telephone Encounter (Signed)
Spoke with [place] Pharmacist at [facility] and gave her the dose of coumadin that the pt takes and she states will send coumadin as ordered

## 2015-01-10 ENCOUNTER — Other Ambulatory Visit: Payer: Self-pay | Admitting: Nurse Practitioner

## 2015-01-10 MED ORDER — WARFARIN SODIUM 10 MG PO TABS: 10.0000 mg | ORAL_TABLET | Freq: Every day | ORAL | Status: DC

## 2015-01-23 ENCOUNTER — Telehealth: Payer: Self-pay | Admitting: Family Medicine

## 2015-01-23 NOTE — Telephone Encounter (Signed)
I spoke w/ the pt re: diclofenac medication for arthritis and Coumadin. Pt was advised by [facility] pharmacy to take diclofenac once a day and only if he needed to because of increased risk of bleeding w/ Coumadin. Diclofenac helps arthritis pain and pt takes it w/ food or snack. He is not having any adverse GI effects and no abnormal bleeding problems. He will continue taking diclofenac as directed.

## 2015-01-28 DIAGNOSIS — M4800 Spinal stenosis, site unspecified: Secondary | ICD-10-CM | POA: Diagnosis not present

## 2015-01-28 DIAGNOSIS — M4804 Spinal stenosis, thoracic region: Secondary | ICD-10-CM | POA: Diagnosis not present

## 2015-01-28 DIAGNOSIS — G8220 Paraplegia, unspecified: Secondary | ICD-10-CM | POA: Diagnosis not present

## 2015-02-07 ENCOUNTER — Ambulatory Visit (INDEPENDENT_AMBULATORY_CARE_PROVIDER_SITE_OTHER): Payer: Commercial Managed Care - HMO | Admitting: *Deleted

## 2015-02-07 DIAGNOSIS — Z86711 Personal history of pulmonary embolism: Secondary | ICD-10-CM

## 2015-02-07 DIAGNOSIS — I824Y3 Acute embolism and thrombosis of unspecified deep veins of proximal lower extremity, bilateral: Secondary | ICD-10-CM

## 2015-02-07 LAB — POCT INR: INR: 1.7

## 2015-02-28 ENCOUNTER — Ambulatory Visit (INDEPENDENT_AMBULATORY_CARE_PROVIDER_SITE_OTHER): Payer: Commercial Managed Care - HMO | Admitting: *Deleted

## 2015-02-28 DIAGNOSIS — Z86711 Personal history of pulmonary embolism: Secondary | ICD-10-CM | POA: Diagnosis not present

## 2015-02-28 DIAGNOSIS — I824Y3 Acute embolism and thrombosis of unspecified deep veins of proximal lower extremity, bilateral: Secondary | ICD-10-CM

## 2015-02-28 DIAGNOSIS — R600 Localized edema: Secondary | ICD-10-CM

## 2015-02-28 LAB — POCT INR: INR: 2.1

## 2015-03-22 ENCOUNTER — Ambulatory Visit (INDEPENDENT_AMBULATORY_CARE_PROVIDER_SITE_OTHER): Payer: Commercial Managed Care - HMO | Admitting: Family Medicine

## 2015-03-22 ENCOUNTER — Encounter: Payer: Self-pay | Admitting: Family Medicine

## 2015-03-22 VITALS — BP 147/99 | HR 76 | Temp 98.1°F | Resp 16

## 2015-03-22 DIAGNOSIS — I8312 Varicose veins of left lower extremity with inflammation: Secondary | ICD-10-CM | POA: Diagnosis not present

## 2015-03-22 DIAGNOSIS — R600 Localized edema: Secondary | ICD-10-CM | POA: Diagnosis not present

## 2015-03-22 DIAGNOSIS — Z0000 Encounter for general adult medical examination without abnormal findings: Secondary | ICD-10-CM

## 2015-03-22 DIAGNOSIS — I8311 Varicose veins of right lower extremity with inflammation: Secondary | ICD-10-CM | POA: Diagnosis not present

## 2015-03-22 DIAGNOSIS — Z1211 Encounter for screening for malignant neoplasm of colon: Secondary | ICD-10-CM

## 2015-03-22 DIAGNOSIS — E119 Type 2 diabetes mellitus without complications: Secondary | ICD-10-CM

## 2015-03-22 DIAGNOSIS — Z1212 Encounter for screening for malignant neoplasm of rectum: Secondary | ICD-10-CM | POA: Diagnosis not present

## 2015-03-22 DIAGNOSIS — R609 Edema, unspecified: Secondary | ICD-10-CM | POA: Diagnosis not present

## 2015-03-22 DIAGNOSIS — I872 Venous insufficiency (chronic) (peripheral): Secondary | ICD-10-CM

## 2015-03-22 DIAGNOSIS — R350 Frequency of micturition: Secondary | ICD-10-CM

## 2015-03-22 LAB — CBC
HCT: 45.8 % (ref 39.0–52.0)
Hemoglobin: 15.2 g/dL (ref 13.0–17.0)
MCH: 28.4 pg (ref 26.0–34.0)
MCHC: 33.2 g/dL (ref 30.0–36.0)
MCV: 85.6 fL (ref 78.0–100.0)
MPV: 10.1 fL (ref 8.6–12.4)
Platelets: 251 10*3/uL (ref 150–400)
RBC: 5.35 MIL/uL (ref 4.22–5.81)
RDW: 16 % — ABNORMAL HIGH (ref 11.5–15.5)
WBC: 6.4 10*3/uL (ref 4.0–10.5)

## 2015-03-22 LAB — POCT URINALYSIS DIPSTICK
Bilirubin, UA: NEGATIVE
Blood, UA: NEGATIVE
Glucose, UA: NEGATIVE
Ketones, UA: NEGATIVE
Leukocytes, UA: NEGATIVE
Nitrite, UA: NEGATIVE
Protein, UA: NEGATIVE
Spec Grav, UA: 1.02
Urobilinogen, UA: 1
pH, UA: 6.5

## 2015-03-22 LAB — LIPID PANEL
Cholesterol: 151 mg/dL (ref 0–200)
HDL: 41 mg/dL (ref >=40)
LDL Cholesterol: 88 mg/dL (ref 0–99)
Total CHOL/HDL Ratio: 3.7 Ratio
Triglycerides: 112 mg/dL (ref <150)
VLDL: 22 mg/dL (ref 0–40)

## 2015-03-22 LAB — COMPREHENSIVE METABOLIC PANEL
ALT: 10 U/L (ref 0–53)
AST: 15 U/L (ref 0–37)
Albumin: 3.6 g/dL (ref 3.5–5.2)
Alkaline Phosphatase: 59 U/L (ref 39–117)
BUN: 12 mg/dL (ref 6–23)
CO2: 27 mEq/L (ref 19–32)
Calcium: 9 mg/dL (ref 8.4–10.5)
Chloride: 103 mEq/L (ref 96–112)
Creat: 0.69 mg/dL (ref 0.50–1.35)
Glucose, Bld: 81 mg/dL (ref 70–99)
Potassium: 4.2 mEq/L (ref 3.5–5.3)
Sodium: 139 mEq/L (ref 135–145)
Total Bilirubin: 1.3 mg/dL — ABNORMAL HIGH (ref 0.2–1.2)
Total Protein: 7.6 g/dL (ref 6.0–8.3)

## 2015-03-22 LAB — HEMOGLOBIN A1C
Hgb A1c MFr Bld: 6 % — ABNORMAL HIGH (ref <5.7)
Mean Plasma Glucose: 126 mg/dL — ABNORMAL HIGH (ref <117)

## 2015-03-22 LAB — THYROID PANEL WITH TSH
Free Thyroxine Index: 2.7 (ref 1.4–3.8)
T3 Uptake: 29 % (ref 22–35)
T4, Total: 9.2 ug/dL (ref 4.5–12.0)
TSH: 2.078 u[IU]/mL (ref 0.350–4.500)

## 2015-03-22 LAB — IFOBT (OCCULT BLOOD): IFOBT: NEGATIVE

## 2015-03-22 MED ORDER — SERTRALINE HCL 100 MG PO TABS: 150.0000 mg | ORAL_TABLET | Freq: Every day | ORAL | Status: DC

## 2015-03-22 MED ORDER — BENEFIBER PLUS HEART HEALTH PO POWD: 1.0000 | Freq: Every day | ORAL | Status: DC

## 2015-03-22 MED ORDER — TRAZODONE HCL 100 MG PO TABS: 100.0000 mg | ORAL_TABLET | Freq: Every day | ORAL | Status: DC

## 2015-03-22 MED ORDER — METOPROLOL SUCCINATE ER 50 MG PO TB24: 50.0000 mg | ORAL_TABLET | Freq: Every evening | ORAL | Status: DC

## 2015-03-22 NOTE — Patient Instructions (Signed)
Keeping you healthy  Get these tests  Blood pressure- Have your blood pressure checked once a year by your healthcare provider.  Normal blood pressure is 120/80  Weight- Have your body mass index (BMI) calculated to screen for obesity.  BMI is a measure of body fat based on height and weight. You can also calculate your own BMI at ViewBanking.si.  Cholesterol- Have your cholesterol checked every year.  Diabetes- Have your blood sugar checked regularly if you have high blood pressure, high cholesterol, have a family history of diabetes or if you are overweight.  Screening for Colon Cancer- Colonoscopy starting at age 7.  Screening may begin sooner depending on your family history and other health conditions. Follow up colonoscopy as directed by your Gastroenterologist.  Screening for Prostate Cancer- Both blood work (PSA) and a rectal exam help screen for Prostate Cancer.  Screening begins at age 89 with African-American men and at age 22 with Caucasian men.  Screening may begin sooner depending on your family history.  Take these medicines  Aspirin- One aspirin daily can help prevent Heart disease and Stroke.  Flu shot- Every fall.  Tetanus- Every 10 years.  Zostavax- Once after the age of 34 to prevent Shingles.  Pneumonia shot- Once after the age of 54; if you are younger than 19, ask your healthcare provider if you need a Pneumonia shot.  Take these steps  Don't smoke- If you do smoke, talk to your doctor about quitting.  For tips on how to quit, go to www.smokefree.gov or call 1-800-QUIT-NOW.  Be physically active- Exercise 5 days a week for at least 30 minutes.  If you are not already physically active start slow and gradually work up to 30 minutes of moderate physical activity.  Examples of moderate activity include walking briskly, mowing the yard, dancing, swimming, bicycling, etc.  Eat a healthy diet- Eat a variety of healthy food such as fruits, vegetables, low  fat milk, low fat cheese, yogurt, lean meant, poultry, fish, beans, tofu, etc. For more information go to www.thenutritionsource.org  Drink alcohol in moderation- Limit alcohol intake to less than two drinks a day. Never drink and drive.  Dentist- Brush and floss twice daily; visit your dentist twice a year.  Depression- Your emotional health is as important as your physical health. If you're feeling down, or losing interest in things you would normally enjoy please talk to your healthcare provider.  Eye exam- Visit your eye doctor every year.  Safe sex- If you may be exposed to a sexually transmitted infection, use a condom.  Seat belts- Seat belts can save your life; always wear one.  Smoke/Carbon Monoxide detectors- These detectors need to be installed on the appropriate level of your home.  Replace batteries at least once a year.  Skin cancer- When out in the sun, cover up and use sunscreen 15 SPF or higher.  Violence- If anyone is threatening you, please tell your healthcare provider.  Living Will/ Health care power of attorney- Speak with your healthcare provider and family.   [employer] in [employer] on [address] does [place] and car conversions for people who cannot use their legs to drive. You can check there to see if they can help with what you need.

## 2015-03-23 ENCOUNTER — Encounter: Payer: Self-pay | Admitting: Family Medicine

## 2015-03-23 LAB — PSA: PSA: 1.15 ng/mL (ref <=4.00)

## 2015-03-23 NOTE — Progress Notes (Signed)
Subjective:    Patient ID: Nicolas [name], male    DOB: Aug 31, 1966, 49 y.o.   MRN: [id number]  HPI This 49 y.o male is her for CPE/labs. He is disabled due to chronic degenerative disc disease of the spine and is S/P multiple surgeries. Type II DM is managed w/o oral medications or Insulin. Due to hx of DVT and PE, he is chronically anticoagulated w/ Coumadin, managed by local Coumadin Clinic. Pt has chronic lower ext edema and stasis dermatitis related to reduced ambulation; he had thoracic spine decompression surgery in Feb 2015. Back pain greatly reduced and pt has made progress w/ PT but still spends most of day in motorized scooter. He can bear weight for a few minutes.  Patient Active Problem List   Diagnosis Date Noted  . Hypoxemia 05/23/2014  . Chronic anticoagulation 05/21/2014  . Spinal stenosis 12/02/2013  . Type II diabetes mellitus 08/03/2013  . Venous stasis dermatitis 09/26/2012  . Encounter for long-term (current) use of anticoagulants 03/26/2012  . Acute venous embolism and thrombosis of deep vessels of proximal lower extremity 03/26/2012  . Spinal stenosis of lumbar region 03/06/2012  . HTN (hypertension) 03/06/2012  . Hx pulmonary embolism 03/06/2012  . Morbid obesity 03/06/2012  . Lower extremity edema 03/06/2012    Prior to Admission medications   Medication Sig Start Date End Date Taking? Authorizing Provider  diclofenac (VOLTAREN) 75 MG EC tablet Take 1 tablet (75 mg total) by mouth 2 (two) times daily. Patient not taking: Reported on 03/22/2015 01/06/15   Barton Fanny, MD  metFORMIN (GLUCOPHAGE) 500 MG tablet Take 1 tablet (500 mg total) by mouth daily. Patient not taking: Reported on 03/22/2015 01/06/15   Barton Fanny, MD  metoprolol succinate (TOPROL-XL) 50 MG 24 hr tablet Take 1 tablet (50 mg total) by mouth every evening. Take with or immediately following a meal.   Yes Barton Fanny, MD  Multiple Vitamin (MULTIVITAMIN WITH MINERALS) TABS  tablet Take 1 tablet by mouth daily.   Yes Historical Provider, MD  sertraline (ZOLOFT) 100 MG tablet Take 1.5 tablets (150 mg total) by mouth at bedtime.   Yes Barton Fanny, MD  traZODone (DESYREL) 100 MG tablet Take 1 tablet (100 mg total) by mouth at bedtime.   Yes Barton Fanny, MD  warfarin (COUMADIN) 10 MG tablet Take 1 tablet (10 mg total) by mouth as directed. Patient not taking: Reported on 03/22/2015 12/31/14   Burnell Blanks, MD  warfarin (COUMADIN) 10 MG tablet Take 1 tablet (10 mg total) by mouth daily. Alternates between 10 mg and 5 mg daily 01/10/15  Yes Burtis Junes, NP    Past Surgical History  Procedure Laterality Date  . Cholecystectomy N/A 05/24/2014    Procedure: LAPAROSCOPIC CHOLECYSTECTOMY;  Surgeon: Zenovia Jarred, MD;  Location: [place];  Service: General;  Laterality: N/A;  . Ercp N/A 05/25/2014    Procedure: ENDOSCOPIC RETROGRADE CHOLANGIOPANCREATOGRAPHY (ERCP);  Surgeon: Beryle Beams, MD;  Location: [place];  Service: Endoscopy;  Laterality: N/A;  . Spine surgery  11/30/11    Total 6 back surgeries  . Spine surgery  58/0998    Dr. Cathren Laine in [place], [place]  . Ivp filter  jan 2012  . Ercp N/A 07/16/2014    Procedure: ENDOSCOPIC RETROGRADE CHOLANGIOPANCREATOGRAPHY (ERCP);  Surgeon: Beryle Beams, MD;  Location: [facility];  Service: Endoscopy;  Laterality: N/A;    History   Social History  . Marital Status: Single  Spouse Name: N/A  . Number of Children: 3  . Years of Education: 10   Occupational History  . Disability    Social History Main Topics  . Smoking status: Former Smoker -- 1.50 packs/day for 10 years    Types: Cigarettes    Quit date: 04/26/2001  . Smokeless tobacco: Never Used  . Alcohol Use: 0.5 oz/week    1 drink(s) per week     Comment: very little  . Drug Use: No  . Sexual Activity: No   Other Topics Concern  . Not on file   Social History Narrative   Patient is single and lives at home, his daughter  lives with him.   Patient has three children.   Patient is disabled.   Patient has a 10 grade education.   Patient is right-handed.   Patient drinks 1 or 2 sodas and tea per week.    Family History  Problem Relation Age of Onset  . Cancer Mother 40    Lung  . Diabetes Father   . Prostate cancer Father   . Hypertension Father   . Hypertension Brother   . Hypertension Brother   . Diabetes Brother   . Heart attack Neg Hx   . Stroke Neg Hx     Review of Systems  Constitutional: Negative.   HENT: Negative.   Eyes: Negative.   Respiratory: Negative for cough, chest tightness, shortness of breath and wheezing.   Cardiovascular: Positive for leg swelling. Negative for chest pain and palpitations.  Gastrointestinal: Positive for diarrhea and constipation. Negative for nausea, vomiting, abdominal pain and blood in stool.  Endocrine: Negative.   Genitourinary: Positive for frequency. Negative for dysuria, hematuria, decreased urine volume, enuresis, difficulty urinating, penile pain and testicular pain.  Musculoskeletal: Positive for back pain, arthralgias and gait problem. Negative for myalgias and neck pain.  Skin: Positive for rash.       Chronic skin changes involving lower legs.  Allergic/Immunologic: Negative.   Neurological: Negative.        Mild numbness and weakness in lower legs.  Psychiatric/Behavioral: Positive for sleep disturbance. Negative for suicidal ideas, behavioral problems, dysphoric mood and agitation. The patient is not nervous/anxious.        Objective:   Physical Exam  Constitutional: He is oriented to person, place, and time. He appears well-developed and well-nourished. No distress.  Blood pressure 147/99, pulse 76, temperature 98.1 F (36.7 C), temperature source Oral, resp. rate 16, SpO2 97 %.   HENT:  Head: Normocephalic and atraumatic.  Right Ear: Hearing, tympanic membrane, external ear and ear canal normal.  Left Ear: Hearing, tympanic  membrane, external ear and ear canal normal.  Nose: Nose normal. No nasal deformity or septal deviation.  Mouth/Throat: Uvula is midline and mucous membranes are normal. No oral lesions. Normal dentition. No uvula swelling. Posterior oropharyngeal erythema present. No oropharyngeal exudate.  Eyes: Conjunctivae, EOM and lids are normal. Pupils are equal, round, and reactive to light. No scleral icterus.  Neck: Trachea normal, normal range of motion, full passive range of motion without pain and phonation normal. Neck supple. No spinous process tenderness and no muscular tenderness present. No thyroid mass and no thyromegaly present.  Cardiovascular: Normal rate, regular rhythm, S1 normal and S2 normal.   No extrasystoles are present. Exam reveals distant heart sounds. Exam reveals no gallop.   No murmur heard. Pulses:      Radial pulses are 2+ on the right side, and 2+ on the left side.  Unable to assess pulses in lower ext (pt seated in scooter and feet swollen).  Pulmonary/Chest: Effort normal and breath sounds normal. No accessory muscle usage. No respiratory distress. He has no wheezes. He has no rhonchi.  Distant BS in bases.  Abdominal:  Abdomen is obese w/ decreased BS; NT. Pt remained seated in scooter due to difficulty reclining on exam table.  Genitourinary: Rectum normal and prostate normal. Rectal exam shows no external hemorrhoid, no fissure, no mass, no tenderness and anal tone normal. Guaiac negative stool. Prostate is not enlarged and not tender.  Musculoskeletal: Normal range of motion. He exhibits edema.  Neurological: He is alert and oriented to person, place, and time. He has normal strength. He displays no atrophy. No cranial nerve deficit or sensory deficit. He exhibits normal muscle tone. Coordination normal.  Lower extremities- quad strength= 3+/5; pt can wiggle toes and can bear weight for up to 60 seconds while leaning against counter in exam room. Gait not observed.    Skin: Skin is warm, dry and intact. Rash noted. No ecchymosis noted. He is not diaphoretic.  Lower extremities- chronic brawny edema with scarring. Multiple healed lesions on upper extremities and hands.  Psychiatric: He has a normal mood and affect. His speech is normal and behavior is normal. Judgment and thought content normal. Cognition and memory are normal.  Nursing note and vitals reviewed.   Results for orders placed or performed in visit on 03/22/15  IFOBT POC (occult bld, rslt in office)  Result Value Ref Range   IFOBT Negative   POCT urinalysis dipstick  Result Value Ref Range   Color, UA Amber    Clarity, UA Clear    Glucose, UA Negative    Bilirubin, UA Negative    Ketones, UA Negative    Spec Grav, UA 1.020    Blood, UA Negative    pH, UA 6.5    Protein, UA negative    Urobilinogen, UA 1.0    Nitrite, UA Negative    Leukocytes, UA Negative       Assessment & Plan:  Medicare Annual Wellness, Subsequent-   Diabetes mellitus without complication - Diet controlled- off all oral medications. Plan: HM Diabetes Foot Exam, Comprehensive metabolic panel, Hemoglobin A1c, Lipid panel  Bilateral edema of lower extremity - Dependent edema unlikely to resolve due to sedentary lifestyle and impaired ability to exercise.  Plan: CBC  Venous stasis dermatitis of both lower extremities-   Morbid obesity - Plan: Thyroid Panel With TSH, Lipid panel  Screening for colorectal cancer - Plan: IFOBT POC (occult bld, rslt in office)  Urinary frequency - Plan: POCT urinalysis dipstick   Meds ordered this encounter  Medications  . Wheat Dextrin-Vit B6-B12-FA (BENEFIBER PLUS B VITS & FA) POWD    Sig: Take 1 scoop by mouth daily for constipation.    Dispense:  3 Bottle    Refill:  3  . traZODone (DESYREL) 100 MG tablet    Sig: Take 1 tablet (100 mg total) by mouth at bedtime.    Dispense:  90 tablet    Refill:  3  . metoprolol succinate (TOPROL-XL) 50 MG 24 hr tablet    Sig:  Take 1 tablet (50 mg total) by mouth every evening. Take with or immediately following a meal.    Dispense:  90 tablet    Refill:  3  . sertraline (ZOLOFT) 100 MG tablet    Sig: Take 1.5 tablets (150 mg total) by mouth at bedtime.  Dispense:  138 tablet    Refill:  3

## 2015-03-30 ENCOUNTER — Ambulatory Visit (INDEPENDENT_AMBULATORY_CARE_PROVIDER_SITE_OTHER): Payer: Commercial Managed Care - HMO | Admitting: *Deleted

## 2015-03-30 DIAGNOSIS — Z86711 Personal history of pulmonary embolism: Secondary | ICD-10-CM | POA: Diagnosis not present

## 2015-03-30 DIAGNOSIS — R600 Localized edema: Secondary | ICD-10-CM

## 2015-03-30 DIAGNOSIS — I824Y3 Acute embolism and thrombosis of unspecified deep veins of proximal lower extremity, bilateral: Secondary | ICD-10-CM | POA: Diagnosis not present

## 2015-03-30 LAB — POCT INR: INR: 2.2

## 2015-04-04 ENCOUNTER — Telehealth: Payer: Self-pay

## 2015-04-04 MED ORDER — BENEFIBER PLUS HEART HEALTH PO POWD: 1.0000 | Freq: Every day | ORAL | Status: DC

## 2015-04-04 NOTE — Telephone Encounter (Signed)
Sent to [facility] never received Prescriptions.

## 2015-05-02 ENCOUNTER — Ambulatory Visit (INDEPENDENT_AMBULATORY_CARE_PROVIDER_SITE_OTHER): Payer: Commercial Managed Care - HMO | Admitting: *Deleted

## 2015-05-02 DIAGNOSIS — I824Y3 Acute embolism and thrombosis of unspecified deep veins of proximal lower extremity, bilateral: Secondary | ICD-10-CM

## 2015-05-02 DIAGNOSIS — Z86711 Personal history of pulmonary embolism: Secondary | ICD-10-CM | POA: Diagnosis not present

## 2015-05-02 LAB — POCT INR: INR: 2.1

## 2015-05-02 MED ORDER — WARFARIN SODIUM 10 MG PO TABS: ORAL_TABLET | ORAL | Status: DC

## 2015-05-03 ENCOUNTER — Telehealth: Payer: Self-pay

## 2015-05-03 NOTE — Telephone Encounter (Signed)
Please advise 

## 2015-05-03 NOTE — Telephone Encounter (Signed)
Pt states that Dr.Mcpherson took him off of his potassium and fluid pill to see how he would do without it, now he feels that he needs to be back on them because he is beginning to have swelling in both legs.  Best# [phone]   Pharmacy:   [place]

## 2015-05-03 NOTE — Telephone Encounter (Signed)
As far as I can see, Dr. Leward Quan has never prescribed lasix for him. It was last prescribed to him 1 year ago by his cardiologist. Last time Dr. Leward Quan prescribed potassium for him was in 2013. Pt needs to discuss this with his cardiologist.

## 2015-05-04 NOTE — Telephone Encounter (Signed)
Left message to discuss with cardiologist.

## 2015-05-23 ENCOUNTER — Other Ambulatory Visit: Payer: Self-pay

## 2015-06-15 NOTE — Patient Outreach (Signed)
[facility]) Care Management  06/15/2015  Nicolas [name] 10/17/1966 [id number]   Referral from [facility] Tier 4 list, assigned to Mariann Laster, RN for patient outreach.  [name] L. Elbert Ewings [facility] Care Management Assistant [phone]

## 2015-06-23 ENCOUNTER — Telehealth: Payer: Self-pay

## 2015-06-23 NOTE — Telephone Encounter (Signed)
Pt called and spoke to [facility], asking if we had received a fax requesting an order for a back brace which he would like to get from the company sending the order. She found the order in her faxes that had arrived and put it in Dr Thompson Caul box for review since Dr Leward Quan has retired. Lars Mage also stated she would call the pt back to let him know she found the order and will give to provider to review. Dr Tamala Julian, [facility] [place].

## 2015-06-27 ENCOUNTER — Other Ambulatory Visit: Payer: Self-pay

## 2015-06-27 NOTE — Patient Outreach (Signed)
[facility]) Care Management  06/27/2015  JEMAINE PROKOP 12-07-1965 [id number]  Telephonic Care Management Note:  Referral Date:  06/15/15 Referral Source:  [facility] Tier 4 Referral Issue:  DM, COPD, CVD. H/O no ED visits or admissions.   Outreach call #1 to patient.  No answer following several rings to Home/Cell # [phone] RN CM rescheduled for next call attempt.    Mariann Laster, RN, BSN, [facility], CCM  [employer] Management Coordinator [phone] Office [phone] Direct [phone] Cell

## 2015-06-29 ENCOUNTER — Other Ambulatory Visit: Payer: Commercial Managed Care - HMO

## 2015-06-29 NOTE — Patient Outreach (Signed)
[facility]) Care Management  06/29/2015  GIAN YBARRA [birth year] [id number]  Telephonic Care Management Note:  Referral Date: 06/15/15 Referral Source: [facility] Tier 4 Referral Issue: DM, COPD, CVD. H/O no ED visits or admissions.   Outreach call #2 to patient. Home/Cell # [phone].  Patient not reached.  RN CM left HIPAA compliant voice message with name and contact #.   Plan: RN CM rescheduled for next call attempt within 1-2 weeks.   Mariann Laster, RN, BSN, [facility], CCM  [employer] Management Coordinator [phone] Office [phone] Direct [phone] Cell

## 2015-07-05 ENCOUNTER — Other Ambulatory Visit: Payer: Self-pay

## 2015-07-05 NOTE — Patient Outreach (Signed)
[facility] [facility]) Care Management  07/05/2015  DONNIVAN VILLENA 03-02-1966 [id number]   Notification received from Mariann Laster, RN Case Manager to close case due to patient being assessed, no further intervention needed.  Chayah Mckee L. Lis Savitt, [place] Care Management Assistant

## 2015-07-05 NOTE — Patient Outreach (Signed)
[place] [facility]) Care Management  07/05/2015  Nicolas [name] 1966/09/29 [id number]   Case closure letter sent to Primary MD.   Mariann Laster, RN, BSN, [facility] Management Care Management Coordinator [phone] Direct [phone] Cell [phone] Office [phone] Fax

## 2015-07-05 NOTE — Patient Outreach (Signed)
[place] [facility]) Care Management  07/05/2015  Nicolas [name] 12/10/1965 [id number]   Telephonic Care Management Note: Triage / Screening   Referral Date: 06/15/15 Referral Source: [facility] Tier 4 Referral Issue: DM, COPD, CVD. H/O no ED visits or admissions.  Insurance:  [employer] PCP:  Dr. Leward Quan - last appt 02/2015 and next appt 08/2015  - (states will see a new MD next visit due to Dr. Leward Quan retired).   Outreach call #3 to patient. Home/Cell # [phone].  Patient reached.   Social Patient lives in his home with his son.  Patient with limited mobility due to H/O past 7-8 back surgeries since 2005.  States new discomfort in back.    Caregiver:  Son if needed Transportation:  Son  DME:  walker, IT trainer W/C, Risk analyst CM instructed patient to notify MD of change in condition and request appt for follow-up.   Patient verbalized understanding and stated he would contact MD to schedule appt.   THN conditions:   COPD:  Patient denies COPD.  States he does have a sleep issue and wakes up to turn over about every two hours.  States he had a machine but unable to state specifically what type; states was not a CPap.  States he was unable to use and sent the machine back over a year ago (2015).  DM  Patient states he is only borderline diabetes and the only reason he was placed on medication was because his blood sugars when up while on Prednisone.  States BS is never over 110 and self checks daily.   States he is taking no medicine.  Stales all A1C's have been 6.0 and below.  Weight:  295 down from 374 in November 2014.    Height: 5 11  HTN Patient states he has noticed increased swelling in his feet.  Patient does not know what his BP is and only checks on MD appt's.  States he is taking his BP medication daily.  RN CM instructed patient to notify MD of change in condition and request appt for follow-up.   Patient verbalized understanding and stated he  would contact MD to schedule appt.   Medication:   Patient states he only takes 3 medications:  Vitamin, Coumadin, and BP pill.  Takes medications compliantly and no issues getting his medications.  Plan:  Patient states he does not feel he needs any additional education on his conditions at this time.  States no issues with DM and does not have COPD.    RN CM identifies no community or telephonic CM needs at this time.   RN CM clarified [facility] service options and advised should patient have care coordination needs to please notify MD so new referral can be sent to [facility].  Patient states self confidence in scheduling MD appt as discussed above.   RN CM will notify [facility] administrative assistant to close case:  Patient assessed, no further intervention needed.  RN CM will notify MD via case closure letter.    Mariann Laster, RN, BSN, [facility], CCM  [employer] Management Coordinator [phone] Direct [phone] Cell [phone] Office [phone] Fax

## 2015-07-11 DIAGNOSIS — M4716 Other spondylosis with myelopathy, lumbar region: Secondary | ICD-10-CM | POA: Diagnosis not present

## 2015-07-12 NOTE — Telephone Encounter (Signed)
Prescription for back brace signed and placed in faxing box.

## 2015-08-02 ENCOUNTER — Ambulatory Visit (INDEPENDENT_AMBULATORY_CARE_PROVIDER_SITE_OTHER): Payer: Commercial Managed Care - HMO

## 2015-08-02 ENCOUNTER — Telehealth: Payer: Self-pay

## 2015-08-02 DIAGNOSIS — M7989 Other specified soft tissue disorders: Secondary | ICD-10-CM

## 2015-08-02 DIAGNOSIS — I824Y3 Acute embolism and thrombosis of unspecified deep veins of proximal lower extremity, bilateral: Secondary | ICD-10-CM

## 2015-08-02 DIAGNOSIS — Z86711 Personal history of pulmonary embolism: Secondary | ICD-10-CM | POA: Diagnosis not present

## 2015-08-02 LAB — POCT INR: INR: 1.8

## 2015-08-02 NOTE — Telephone Encounter (Signed)
Pt seen today in [facility]madin Clinic with c/o B lower extremity swelling pt relates to increased fluid retention.  Pt states in the past he has been on Lasix and KCL, but at last visit with Dr Angelena Form he advised pt to continue taking as needed for LE swelling.  Looking at pt's med list history it appears pt was previously on Torsemide 20mg  BID, not Furosemide.  Please advise if pt needs prn diuretic on medication list and to be rx as needed for LE swelling.  Pt's wt today is 325.12, last weight on record 298 in 12/15.  Pt does not have scale at home, so no record of daily wts.  Please call pt and advise of recommendations. [facility] [address].

## 2015-08-03 ENCOUNTER — Other Ambulatory Visit (INDEPENDENT_AMBULATORY_CARE_PROVIDER_SITE_OTHER): Payer: Commercial Managed Care - HMO

## 2015-08-03 DIAGNOSIS — M7989 Other specified soft tissue disorders: Secondary | ICD-10-CM | POA: Diagnosis not present

## 2015-08-03 LAB — BASIC METABOLIC PANEL
BUN: 13 mg/dL (ref 6–23)
CO2: 28 mEq/L (ref 19–32)
Calcium: 9.2 mg/dL (ref 8.4–10.5)
Chloride: 104 mEq/L (ref 96–112)
Creatinine, Ser: 0.68 mg/dL (ref 0.40–1.50)
GFR: 159.63 mL/min (ref >60.00)
Glucose, Bld: 121 mg/dL — ABNORMAL HIGH (ref 70–99)
Potassium: 3.8 mEq/L (ref 3.5–5.1)
Sodium: 138 mEq/L (ref 135–145)

## 2015-08-03 NOTE — Telephone Encounter (Signed)
I spoke with pt and gave him information from Dr. Angelena Form.  He will come in for BMP today

## 2015-08-03 NOTE — Telephone Encounter (Signed)
Left message to call back  

## 2015-08-03 NOTE — Telephone Encounter (Signed)
Pat, Can we call him and get him to come by for a BMET? I would then start Lasix 40 mg daily with KCl 20 meq daily based on results of BMET. Thanks, chris

## 2015-08-04 MED ORDER — FUROSEMIDE 40 MG PO TABS: 40.0000 mg | ORAL_TABLET | Freq: Every day | ORAL | Status: DC

## 2015-08-04 MED ORDER — POTASSIUM CHLORIDE CRYS ER 20 MEQ PO TBCR: 20.0000 meq | EXTENDED_RELEASE_TABLET | Freq: Every day | ORAL | Status: DC

## 2015-08-04 NOTE — Telephone Encounter (Signed)
Reviewed with Dr. Angelena Form. BMET OK. Start Lasix 40 mg daily and KCl 20 meq daily. I spoke with pt and reviewed lab results and instructions with him.  He would like prescriptions sent to [facility].  Will send in.

## 2015-08-24 ENCOUNTER — Telehealth: Payer: Self-pay | Admitting: Family Medicine

## 2015-08-24 NOTE — Telephone Encounter (Signed)
Called patient to schedule appointment to follow up on hypertension and diabetes.

## 2015-08-29 ENCOUNTER — Ambulatory Visit (INDEPENDENT_AMBULATORY_CARE_PROVIDER_SITE_OTHER): Payer: Commercial Managed Care - HMO | Admitting: *Deleted

## 2015-08-29 ENCOUNTER — Encounter: Payer: Self-pay | Admitting: Physician Assistant

## 2015-08-29 DIAGNOSIS — R600 Localized edema: Secondary | ICD-10-CM

## 2015-08-29 DIAGNOSIS — I824Y3 Acute embolism and thrombosis of unspecified deep veins of proximal lower extremity, bilateral: Secondary | ICD-10-CM | POA: Diagnosis not present

## 2015-08-29 DIAGNOSIS — Z7901 Long term (current) use of anticoagulants: Secondary | ICD-10-CM | POA: Diagnosis not present

## 2015-08-29 DIAGNOSIS — Z86711 Personal history of pulmonary embolism: Secondary | ICD-10-CM

## 2015-08-29 LAB — POCT INR: INR: 3.1

## 2015-08-29 MED ORDER — WARFARIN SODIUM 10 MG PO TABS: ORAL_TABLET | ORAL | Status: DC

## 2015-08-30 ENCOUNTER — Encounter: Payer: Self-pay | Admitting: Family Medicine

## 2015-08-30 ENCOUNTER — Ambulatory Visit (INDEPENDENT_AMBULATORY_CARE_PROVIDER_SITE_OTHER): Payer: Commercial Managed Care - HMO | Admitting: Family Medicine

## 2015-08-30 VITALS — BP 150/84 | HR 71 | Temp 98.7°F | Resp 16 | Ht 71.0 in | Wt 315.0 lb

## 2015-08-30 DIAGNOSIS — M4806 Spinal stenosis, lumbar region: Secondary | ICD-10-CM

## 2015-08-30 DIAGNOSIS — Z1159 Encounter for screening for other viral diseases: Secondary | ICD-10-CM | POA: Diagnosis not present

## 2015-08-30 DIAGNOSIS — E119 Type 2 diabetes mellitus without complications: Secondary | ICD-10-CM | POA: Diagnosis not present

## 2015-08-30 DIAGNOSIS — Z23 Encounter for immunization: Secondary | ICD-10-CM | POA: Diagnosis not present

## 2015-08-30 DIAGNOSIS — I10 Essential (primary) hypertension: Secondary | ICD-10-CM

## 2015-08-30 DIAGNOSIS — Z114 Encounter for screening for human immunodeficiency virus [HIV]: Secondary | ICD-10-CM | POA: Diagnosis not present

## 2015-08-30 DIAGNOSIS — M48061 Spinal stenosis, lumbar region without neurogenic claudication: Secondary | ICD-10-CM

## 2015-08-30 DIAGNOSIS — Z7409 Other reduced mobility: Secondary | ICD-10-CM | POA: Diagnosis not present

## 2015-08-30 DIAGNOSIS — M4804 Spinal stenosis, thoracic region: Secondary | ICD-10-CM | POA: Diagnosis not present

## 2015-08-30 DIAGNOSIS — Z789 Other specified health status: Secondary | ICD-10-CM

## 2015-08-30 LAB — CBC
HCT: 42.5 % (ref 39.0–52.0)
Hemoglobin: 14.2 g/dL (ref 13.0–17.0)
MCH: 29.1 pg (ref 26.0–34.0)
MCHC: 33.4 g/dL (ref 30.0–36.0)
MCV: 87.1 fL (ref 78.0–100.0)
MPV: 10.3 fL (ref 8.6–12.4)
Platelets: 252 10*3/uL (ref 150–400)
RBC: 4.88 MIL/uL (ref 4.22–5.81)
RDW: 15.8 % — ABNORMAL HIGH (ref 11.5–15.5)
WBC: 7.2 10*3/uL (ref 4.0–10.5)

## 2015-08-30 NOTE — Progress Notes (Signed)
Subjective:    Patient ID: Nicolas [name], male    DOB: 1966/11/14, 49 y.o.   MRN: [id number]  HPI This is a pleasant 49 yo male who presents today for follow up of DM type 2. He had BMP done last month. Glucose elevated at 121, otherwise normal.   He has stopped several of his medications including metformin, sertraline and fiber therapy.  He is going to the [facility] twice a week for exercise. He is noticing more pain, worse in his hips. He is requesting a referral to [place]. He has intermittent, sharp low back pain. Gets better with adjusting his position. He has continued bilateral leg pain and numbness and tingling. Is able to ambulate 20-30 feet with his walker. Last physical therapy was in early spring.   Past Medical History  Diagnosis Date  . Arthritis   . Clotting disorder ([place])   . Hypertension   . Pulmonary embolism ([place])     x 3 in 2005, 2008, 2010  . DVT (deep venous thrombosis) ([place])     Summer 2012  . Spinal stenosis   . Depression   . Diabetes mellitus without complication ([place])   . Sleep apnea   . History of home oxygen therapy     uses mouthpiece ventilation with 2 liters supplemental oxygen all the time   Past Surgical History  Procedure Laterality Date  . Cholecystectomy N/A 05/24/2014    Procedure: LAPAROSCOPIC CHOLECYSTECTOMY;  Surgeon: Zenovia Jarred, MD;  Location: [place];  Service: General;  Laterality: N/A;  . Ercp N/A 05/25/2014    Procedure: ENDOSCOPIC RETROGRADE CHOLANGIOPANCREATOGRAPHY (ERCP);  Surgeon: Beryle Beams, MD;  Location: [place];  Service: Endoscopy;  Laterality: N/A;  . Spine surgery  11/30/11    Total 6 back surgeries  . Spine surgery  95/2841    Dr. Cathren Laine in [place], [place]  . Ivp filter  jan 2012  . Ercp N/A 07/16/2014    Procedure: ENDOSCOPIC RETROGRADE CHOLANGIOPANCREATOGRAPHY (ERCP);  Surgeon: Beryle Beams, MD;  [facility];  Service: Endoscopy;  Laterality: N/A;   Family History  Problem Relation Age of  Onset  . Cancer Mother 51    Lung  . Diabetes Father   . Prostate cancer Father   . Hypertension Father   . Hypertension Brother   . Hypertension Brother   . Diabetes Brother   . Heart attack Neg Hx   . Stroke Neg Hx    Social History  Substance Use Topics  . Smoking status: Former Smoker -- 1.50 packs/day for 10 years    Types: Cigarettes    Quit date: 04/26/2001  . Smokeless tobacco: Never Used  . Alcohol Use: 0.5 oz/week    1 drink(s) per week     Comment: very little    Review of Systems  Constitutional: Negative for fever, fatigue and unexpected weight change.  Respiratory: Negative for cough and shortness of breath.   Cardiovascular: Positive for leg swelling (chronic). Negative for chest pain.  Gastrointestinal: Negative for nausea, abdominal pain and constipation.  Endocrine: Negative for polydipsia, polyphagia and polyuria.  Musculoskeletal: Positive for myalgias, back pain and arthralgias.  Skin: Negative.   Psychiatric/Behavioral: Negative.       Objective:   Physical Exam  Constitutional: He is oriented to person, place, and time. He appears well-developed and well-nourished. No distress.  Obese male in Transport planner.   HENT:  Head: Normocephalic and atraumatic.  Mouth/Throat: Oropharynx is clear and moist.  Eyes:  Conjunctivae are normal.  Cardiovascular: Normal rate, regular rhythm and normal heart sounds.   Pulmonary/Chest: Effort normal and breath sounds normal.  Musculoskeletal: He exhibits edema (trace pretibial).  Neurological: He is alert and oriented to person, place, and time.  Skin: Skin is warm and dry. He is not diaphoretic.  Psychiatric: He has a normal mood and affect. His behavior is normal. Judgment and thought content normal.  Vitals reviewed.  BP 150/84 mmHg  Pulse 71  Temp(Src) 98.7 F (37.1 C)  Resp 16  Ht 5\' 11"  (1.803 m)  Wt 315 lb (142.883 kg)  BMI 43.95 kg/m2     Assessment & Plan:  1. Need for immunization against  influenza - Flu Vaccine QUAD 36+ mos IM (Fluarix)  2. Diabetes mellitus without complication (HCC) - CBC - Hemoglobin A1c - Microalbumin, urine  3. Morbid obesity, unspecified obesity type ([place]) - encouraged continued exercise and watching portions  4. Spinal stenosis of lumbar region - Ambulatory referral to Neurosurgery  5. Spinal stenosis of thoracic region - Ambulatory referral to Neurosurgery  6. Impaired mobility and activities of daily living - Ambulatory referral to Neurosurgery  7. Essential hypertension - Blood pressure a little high today, he reports compliance with medication - Will recheck at next visit  8. Screening for HIV without presence of risk factors - HIV antibody  - f/u 6 months  [name], FNP-BC  [facility] [place] [facility]  08/31/2015 1:05 PM

## 2015-08-31 DIAGNOSIS — H5210 Myopia, unspecified eye: Secondary | ICD-10-CM | POA: Diagnosis not present

## 2015-08-31 DIAGNOSIS — H5213 Myopia, bilateral: Secondary | ICD-10-CM | POA: Diagnosis not present

## 2015-08-31 LAB — HEMOGLOBIN A1C
Hgb A1c MFr Bld: 5.9 % — ABNORMAL HIGH (ref <5.7)
Mean Plasma Glucose: 123 mg/dL — ABNORMAL HIGH (ref <117)

## 2015-08-31 LAB — HIV ANTIBODY (ROUTINE TESTING W REFLEX): HIV 1&2 Ab, 4th Generation: NONREACTIVE

## 2015-08-31 LAB — MICROALBUMIN, URINE: Microalb, Ur: 0.6 mg/dL (ref <2.0)

## 2015-10-05 NOTE — Patient Outreach (Signed)
[place] [facility]) Care Management  10/05/2015  Nicolas [name] August 07, 1966 [id number]   Referral from [facility] tier 4 list, assigned Mariann Laster, RN for patient outreach.  [name] L. Zi [place], [place] Care Management Assistant

## 2015-10-11 ENCOUNTER — Other Ambulatory Visit: Payer: Self-pay

## 2015-10-11 NOTE — Patient Outreach (Signed)
[place] [facility]) Care Management  10/11/2015  ARDELL HEGGE 03/10/1966 [id number]   Telephone Screen  Referral Date: 10/05/15 Referral Source: [facility] Tier 4 List Referral Reason: DM,CHF, no ED visits or admits  Outreach attempt #1 to patient. Male answered the phone and reported patient was not available at this time.  Plan: RN CM will attempt outreach call to patient within a week.  Enzo Montgomery, RN,BSN,CCM [place] Management Telephonic Care Management Coordinator Direct Phone: [phone] Toll Free: [phone] Fax: [phone]

## 2015-10-24 ENCOUNTER — Other Ambulatory Visit: Payer: Self-pay

## 2015-10-24 DIAGNOSIS — I5040 Unspecified combined systolic (congestive) and diastolic (congestive) heart failure: Secondary | ICD-10-CM

## 2015-10-24 NOTE — Patient Outreach (Signed)
[place] [facility]) Care Management  10/24/2015  Nicolas [name] 1966/05/25 [id number]   Care Coordination Note  Outreach call to [facility], [place], [place], [place] [address] [phone]. RN CM verified that provider provides no OP PT services and states patient may be thinking of [facility] or [facility] which are both on [address].    Plan: RN CM will contact MD to discuss patient's request and provide the above update.   RN CM will also contact [facility] to verify in-network services with Nicolas Sites, RN, BSN, [facility], [place] Management Care Management Coordinator [phone] Direct [phone] Cell [phone] Office [phone] Fax

## 2015-10-24 NOTE — Patient Outreach (Signed)
[facility]) Care Management  10/24/2015  IZEN PETZ [birth year] [id number]   Telephonic Screening   Providers: Primary MD:  [name]   last appt 08/30/2015.  HH: None Insurance: [employer]  Social: Patient lives in his home with his 48 year old son.     Mobility: Patient with limited mobility due to H/O past 7-8 back surgeries since 2005. Patient states he would like to get OP PT services with the [facility], [place], [place], [place] [address] [phone].  Patient states he really could use some help cleaning up his home.    Falls: 1  Pain: none Caregiver: 47 year old son  Transportation:  W/C and  city bus Resources: [employer].  States he has previously applied for Medicaid twice and did not meet eligibility approximately 1 1/2 years ago.   DME: walker, electric W/C, glucometer  THN conditions:  DM, CHF Admissions: 0 ER visits: 0 Diabetes:  Patient states he is only borderline diabetes and the only reason he was placed on medication was because his blood sugars when up while on Prednisone. States BS is never over 110 and self checks daily. States he is taking no medicine. States all A1C's have been 6.0 and below.  CHF:  Patient is not able to provide any details relating to his HF condition.  BP monitored at MD appt's only.  Weight: 315  And last known BP 150/84.  Height 71 inches   Medications:  Less than 10 Medication cost not an issue.  Flu Vaccine 08/30/2015  Consent: Patient consents to [facility] services.   Plan:   RN CM will provide care coordination services to assist with patient's request for OP PT services. RN CM will notify MD of this request.   [facility] RN CM referral sent -Please assess if patient has diabetic supplies in the home for testing as patient states he does not need to test.  -Patient knows little regarding CHF diagnosis or management.  It is not clear if patient has  scales in the home that will weigh over 300 lbs.  -Care Coordination needs for OP Rehab services requested by patient.  Please see this writers follow-up notes initiated at time of this screening on 10/25/2015 and continue any further coordination services.   [facility] Social Worker Referral sent Home assessment needed due to 48 year old in the home acting as caregiver.  -Financial Assessment:   -Please complete patient home assessment for community resources; patient requested assistance with house cleaning.  Patient confined to W/C.  Please assess home for to assure essential needs are being met such as food.  Patient gets limited Cytogeneticist and has a 48 year old in the home who also acts as caregiver.    -Patient may need assistance with scales for CHF management; please discuss with [place] RN CM for scales that will weigh over 300 following RN CMs assessment.    RN CM notified [place] Management Assistant: agreed to services/case opened. RN CM will schedule for next contact call within 2 weeks. RN CM advised to please notify MD of any changes in condition prior to scheduled appt's.   RN CM provided contact name and # [phone] or main office # [phone] and 24-hour nurse line # [phone].  RN CM confirmed patient is aware of 911 services for urgent emergency needs.  Mariann Laster, RN, BSN, [place], [place] [facility] Care Management Care Management  Coordinator [phone] Direct [phone] Cell [phone] Office [phone] Fax

## 2015-10-25 ENCOUNTER — Other Ambulatory Visit: Payer: Self-pay

## 2015-10-25 ENCOUNTER — Other Ambulatory Visit: Payer: Self-pay | Admitting: Family Medicine

## 2015-10-25 DIAGNOSIS — M4726 Other spondylosis with radiculopathy, lumbar region: Secondary | ICD-10-CM

## 2015-10-25 DIAGNOSIS — R2689 Other abnormalities of gait and mobility: Secondary | ICD-10-CM

## 2015-10-25 NOTE — Patient Outreach (Signed)
[facility]) Care Management  10/25/2015  Nicolas [name] 15-Mar-1966 [id number]   Telephonic Care Coordination  Outreach call to [facility] to verify in-network provider with [facility].  Contact: Almyra Free confirms that office will contact insurance for authorization and confirm in-network coverage and will notify patient prior to service initiation.    Plan: RN CM will contact Primary MD:  Dr .Carlean Purl regarding patient's request for OP Rehab order.  RN CM will provide [facility] Community RN CM and [facility] Social Worker this update.    Mariann Laster, RN, BSN, [facility], CCM  [employer] Management Coordinator [phone] Direct [phone] Cell [phone] Office [phone] Fax

## 2015-10-25 NOTE — Patient Outreach (Signed)
[facility]) Care Management  10/25/2015  Nicolas [name] [birth year] [id number]   10/25/2015  Nicolas [name] June 27, 1966 [id number]   Care Coordination   Outreach call to patient.  Patient reached and RN CM provided update on  Rehab/Neuro/Back plan of care.  Primary MD: [name], [place] [phone], [facility], [address] [place], [place] [address]. FNP previously referred patient to the [facility] but they called back and refused to see patient due to his complex case history.  FNP will make new referral for patient to see Dr. Nelva Bush at the [facility].  FNP will hold off on OP Rehab order due to FNP states not comfortable with order until evaluation completed with Specialist. Primary Care office will contact patient with referral information and appointment information. Primary Care FNP will send order for referral today 10/25/2015.  Diabetes  FNP confirms patient is only required to manage his diabetes with diet only. No medications or blood sugar testing required. Last A1c was normal.   CHF  FNP reviewed case for CHF diagnosis. Confirms last EF was normal. FNP feels this is an incorrect diagnosis provided by [employer] to [facility].   Mariann Laster, RN, BSN, [facility], CCM  [employer] Management Coordinator [phone] Direct [phone] Cell [phone] Office [phone] Fax

## 2015-10-25 NOTE — Progress Notes (Signed)
Spoke with [name] from [facility] regarding Mr. Nicolas [name]. He is requesting neuro rehab. Had referred him to [facility], but they declined to see him due to high complexity. I have placed referral to Dr. Nelva Bush at [facility] to see if he would be willing to direct any additional therapies. [name] will discuss this with [name].

## 2015-10-25 NOTE — Patient Outreach (Signed)
[facility]) Care Management  10/25/2015  Nicolas [name] [birth year] [id number]   Care Coordination   Outreach call to Primary MD:  [name], [place] [phone], [facility], [address] [place], [place] [address] to notify of patient's request for OP PT (Neuro) order.  H/o one reported fall.  Patient confined to W/C and able to minimally use a walker.  FNP confirms last Primary MD appt 08/30/2015.  States h/o previously referring patient to the [facility] but they called back and refused to see patient due to his complex case history.  FNP states she will make new referral for patient to see Dr. Nelva Bush at the [facility].  Will hold off on OP Rehab order due to FNP states not comfortable with order until evaluation completed with Specialist.    Diabetes RN CM provided update that patient reports no BS testing or medication management needed.  FNP confirms patient is only required to manage his diabetes with diet only.  No medications or blood sugar testing required.  Last A1c was normal.   CHF RN CM provided update that patient is unaware of CHF diagnosis.  FNP reviewed case for CHF diagnosis.  Confirms last EF was normal.  FNP feels this is an incorrect diagnosis provided by [employer] to [facility].   Plan:  Primary Care office to contact patient with referral information and appointment information. Primary Care FNP will send order for referral today 10/25/2015.  RN CM will provide patient, [facility] RN CM and [facility] Social Worker of this update.   Mariann Laster, RN, BSN, [facility], CCM  [employer] Management Coordinator [phone] Direct [phone] Cell [phone] Office [phone] Fax

## 2015-10-26 ENCOUNTER — Encounter: Payer: Self-pay | Admitting: *Deleted

## 2015-11-02 ENCOUNTER — Other Ambulatory Visit: Payer: Self-pay

## 2015-11-02 NOTE — Patient Outreach (Signed)
[place] [facility]) Care Management  11/02/2015  Nicolas [name] Jul 29, 1966 [id number]  Assessment: Referral from telephonic care coordinator. Call to schedule home visit.  Plan: Scheduled for next week December 14  Thea Silversmith, RN, MSN, [place] Coordinator Cell: [phone]

## 2015-11-04 ENCOUNTER — Ambulatory Visit: Payer: Commercial Managed Care - HMO | Admitting: *Deleted

## 2015-11-09 ENCOUNTER — Other Ambulatory Visit: Payer: Self-pay

## 2015-11-09 NOTE — Patient Outreach (Signed)
[facility]) Care Management  [place]  11/09/2015   Nicolas [name] Mar 03, 1966 [id number]  Subjective: Member reports his main need is to become more mobile.   Objective: BP 144/84 mmHg  Pulse 66  Resp 20  SpO2 95%, lungs clear/decreased, heart rate regular  Current Medications:  Current Outpatient Prescriptions  Medication Sig Dispense Refill  . diclofenac (VOLTAREN) 75 MG EC tablet Take 75 mg by mouth 2 (two) times daily.    . furosemide (LASIX) 40 MG tablet Take 1 tablet (40 mg total) by mouth daily. 90 tablet 3  . metoprolol succinate (TOPROL-XL) 50 MG 24 hr tablet Take 1 tablet (50 mg total) by mouth every evening. Take with or immediately following a meal. 90 tablet 3  . potassium chloride SA (K-DUR,KLOR-CON) 20 MEQ tablet Take 1 tablet (20 mEq total) by mouth daily. 90 tablet 3  . sertraline (ZOLOFT) 100 MG tablet Take 100 mg by mouth daily. Takes 1.5 tablets at bedtime.    Marland Kitchen warfarin (COUMADIN) 10 MG tablet Take as directed by coumadin clinic 90 tablet 0  . Multiple Vitamin (MULTIVITAMIN WITH MINERALS) TABS tablet Take 1 tablet by mouth daily. Reported on 11/09/2015     No current facility-administered medications for this visit.    Functional Status:  In your present state of health, do you have any difficulty performing the following activities: 11/09/2015 08/30/2015  Hearing? N N  Vision? N N  Difficulty concentrating or making decisions? N N  Walking or climbing stairs? Y Y  Dressing or bathing? - Y  Doing errands, shopping? N Y  Conservation officer, nature and eating ? Y -  Using the Toilet? (No Data) -  In the past six months, have you accidently leaked urine? Y -  Do you have problems with loss of bowel control? Y -  Managing your Medications? N -  Managing your Finances? N -  Housekeeping or managing your Housekeeping? Y -    Fall/Depression Screening: PHQ 2/9 Scores 11/09/2015 10/24/2015 08/30/2015 07/05/2015 03/22/2015  PHQ - 2 Score 2 0 0 0  0   Fall Risk  11/09/2015 10/24/2015 08/30/2015 07/05/2015 03/22/2015  Falls in the past year? Yes Yes Yes No No  Number falls in past yr: 2 or more 1 2 or more - -  Injury with Fall? No No No - -  Risk Factor Category  - - High Fall Risk - -  Risk for fall due to : History of fall(s) Impaired balance/gait;Impaired mobility Impaired mobility;Impaired balance/gait;History of fall(s) Impaired mobility -  Follow up Falls prevention discussed;Education provided Falls prevention discussed - - -    Assessment: Home assessment completed. Present during visit was member's brother. RNCM completed initial assessment.   History of type 2 diabetes, diet controlled. Per primary care member controlled and member does not need to check his blood sugar.   Medication reviewed-Member is able to obtain his medications and manages his medication without difficulty.   history of spinal stenosis and chronic back/leg pain. Member reports he has started taking diclofenac scheduled and this has helped relieve his pain and allowed him to become more mobile than he has been in the past.   Limited mobility-Member expresses his desire to increase his mobility. States he has an appointment with [facility] orthopedic  To assess if he can participate in outpatient rehabilitation. Member reports he can complete his activities of daily living, but it is difficult for him, and could use assistance at times. RNCM called and  placed member on the waiting list for personal care service with [facility]. Member denies any other issues or needs at this time. Reports he is just waiting to be cleared to begin therapy to increase mobility. RNCM discussed fall prevention strategies.  RNCM discussed case closure, member is agreeable. RNCM encouraged member to call the 24 hour nurse advice line as needed. Member is aware that he can call RNCM for questions and if needs change.   Plan: close case, send case closure letter  to primary care.  Thea Silversmith, RN, MSN, [place] Coordinator Cell: [phone]

## 2015-11-16 ENCOUNTER — Other Ambulatory Visit: Payer: Self-pay | Admitting: *Deleted

## 2015-11-16 NOTE — Patient Outreach (Signed)
[facility]) Care Management  11/16/2015  Nicolas [name] [birth year] [id number]   CSW received a new referral on patient from Mariann Laster, Telephonic Nurse Case Manager with [facility], indicating that patient would benefit from social work involvement.  More specifically, [name] requested that CSW schedule a home visit with patient to assess patient's currently living arrangements, as [name] reports that patient has a 48 year old living in the home with patient providing caregiver services.  In talking with [name], patient reported that he needs financial assistance to help paying monthly expenses, as well as obtain food, in addition to house cleaning services.  Patient is currently wheelchair bound and only receives a limited amount in [place] per month.  Last, [name] that patient may need assistance obtaining a weight scale for home use due to recent diagnosis of Congestive Heart Failure. CSW was able to make brief contact with patient today to introduce self, explain role and types of services provided through [facility].  CSW was also able to obtain two HIPAA compliant identifiers from patient, which included patient's name and date of birth.  CSW explained the reason for the call; however, patient reported, "I don't need none of that". CSW reminded patient of his conversation recently with [name], in which all of the above information was discussed and agreed upon. Patient indicated that his "situation has changed".  Patient went on to say that he will begin receiving physical and occupational therapy at [facility] to increase mobility and ambulation.  Patient has also been placed on the waiting list for PCS ([place]) through [facility] [facility]  CSW inquired as to how CSW could be of assistance to patient at this  time, but patient denied social work needs and services.   CSW will perform a case closure on patient, as all goals of treatment have been met from social work standpoint and no additional social work needs have been identified at this time. CSW will notify patient's RNCM with [facility], Thea Silversmith of CSW's plans to close patient's case. CSW will fax a correspondence letter to patient's Primary Care Physician, [name] to ensure that Dr. Carlean Purl is aware of CSW's involvement with patient. CSW will submit a case closure request to Lurline Del, Care Management Assistant with [facility], in the form of an [employer].   Nat Christen, BSW, MSW, LCSW  Licensed Education officer, [facility]  Mailing [place] [address], [place], [place] [address] Physical Address-[address], [place],  [address] Toll Free Main # [phone] Fax # [phone] Cell # [phone]  Fax # [phone]  [email or web address]

## 2015-12-08 ENCOUNTER — Encounter: Payer: Self-pay | Admitting: Cardiovascular Disease

## 2015-12-08 ENCOUNTER — Ambulatory Visit (INDEPENDENT_AMBULATORY_CARE_PROVIDER_SITE_OTHER): Payer: Commercial Managed Care - HMO | Admitting: Cardiovascular Disease

## 2015-12-08 VITALS — BP 110/68 | HR 76 | Ht 70.0 in | Wt 321.0 lb

## 2015-12-08 DIAGNOSIS — Z86711 Personal history of pulmonary embolism: Secondary | ICD-10-CM | POA: Diagnosis not present

## 2015-12-08 DIAGNOSIS — Z7901 Long term (current) use of anticoagulants: Secondary | ICD-10-CM

## 2015-12-08 DIAGNOSIS — R600 Localized edema: Secondary | ICD-10-CM | POA: Diagnosis not present

## 2015-12-08 DIAGNOSIS — R6889 Other general symptoms and signs: Secondary | ICD-10-CM | POA: Diagnosis not present

## 2015-12-08 NOTE — Progress Notes (Signed)
Chief Complaint  Patient presents with  . Follow-up    Yearly check since he is followed in our coumadin clinic.      History of Present Illness: 50 yo male with history of HTN, PE on chronic coumadin therapy, spinal stenosis here today for follow up. He is seen once per year in our office since he is followed in our coumadin clinic. I saw him as a new patient 03/24/12 for evaluation of lower extremity edema. He was discharged from a rehab facility on 02/28/12 after spine surgery which was performed at [facility] in [place], [place] in January 2013. The pt reports total of 6 back surgeries for spinal stenosis ( spine disorder first diagnosed in 2004). He has a history of DVT in the summer 2012. He has history of PE in 2005, 2008, 2010. He has been on coumadin since summer 2012. He had an IVC filter placed in December 2012 when his blood thinner was stopped.  He had newly diagnosed HTN after the surgery along with fluid retention and lower extremity edema. He had been on Demadex 20 mg po BID since February 2013.  I suspected that he has chronic venous stasis disease. I ordered repeat venous dopplers and an echo. His echo showed normal LV size and function with LVEF of 55-60% with mild LVH and mild dilation of the left atrium. His venous dopplers May 2013  showed no evidence of DVT.   He has no new complaints today. Still has swelling in his legs that comes and goes. He only uses Lasix several days per week. No chest pain or SOB.   Primary Care Physician: Ellsworth Lennox  Past Medical History  Diagnosis Date  . Arthritis   . Clotting disorder ([place])   . Hypertension   . Pulmonary embolism ([place])     x 3 in 2005, 2008, 2010  . DVT (deep venous thrombosis) ([place])     Summer 2012  . Spinal stenosis   . Depression   . Diabetes mellitus without complication ([place])   . Sleep apnea   . History of home oxygen therapy     uses mouthpiece ventilation with 2 liters supplemental oxygen all the  time    Past Surgical History  Procedure Laterality Date  . Cholecystectomy N/A 05/24/2014    Procedure: LAPAROSCOPIC CHOLECYSTECTOMY;  Surgeon: Zenovia Jarred, MD;  Location: [facility];  Service: General;  Laterality: N/A;  . Ercp N/A 05/25/2014    Procedure: ENDOSCOPIC RETROGRADE CHOLANGIOPANCREATOGRAPHY (ERCP);  Surgeon: Beryle Beams, MD;  Location: [facility];  Service: Endoscopy;  Laterality: N/A;  . Spine surgery  11/30/11    Total 6 back surgeries  . Spine surgery  [id number]    Dr. Cathren Laine in [place], [place]  . Ivp filter  jan 2012  . Ercp N/A 07/16/2014    Procedure: ENDOSCOPIC RETROGRADE CHOLANGIOPANCREATOGRAPHY (ERCP);  Surgeon: Beryle Beams, MD;  Location: [facility];  Service: Endoscopy;  Laterality: N/A;    Current Outpatient Prescriptions  Medication Sig Dispense Refill  . diclofenac (VOLTAREN) 75 MG EC tablet Take 75 mg by mouth 2 (two) times daily.    . furosemide (LASIX) 40 MG tablet Take 1 tablet (40 mg total) by mouth daily. 90 tablet 3  . metoprolol succinate (TOPROL-XL) 50 MG 24 hr tablet Take 1 tablet (50 mg total) by mouth every evening. Take with or immediately following a meal. 90 tablet 3  . Multiple Vitamin (MULTIVITAMIN WITH MINERALS) TABS tablet Take 1 tablet by  mouth daily. Reported on 11/09/2015    . potassium chloride SA (K-DUR,KLOR-CON) 20 MEQ tablet Take 1 tablet (20 mEq total) by mouth daily. 90 tablet 3  . sertraline (ZOLOFT) 100 MG tablet Take 100 mg by mouth daily. Takes 1.5 tablets at bedtime.    Marland Kitchen warfarin (COUMADIN) 10 MG tablet Take as directed by coumadin clinic 90 tablet 0   No current facility-administered medications for this visit.    Allergies  Allergen Reactions  . Lisinopril Other (See Comments)    cough  . Ace Inhibitors Other (See Comments)    cough    Social History   Social History  . Marital Status: Single    Spouse Name: N/A  . Number of Children: 3  . Years of Education: 10   Occupational History  . Disability     Social History Main Topics  . Smoking status: Former Smoker -- 1.50 packs/day for 10 years    Types: Cigarettes    Quit date: 04/26/2001  . Smokeless tobacco: Never Used  . Alcohol Use: 0.5 oz/week    1 drink(s) per week     Comment: very little  . Drug Use: No  . Sexual Activity: No   Other Topics Concern  . Not on file   Social History Narrative   Patient is single and lives at home, his daughter lives with him.   Patient has three children.   Patient is disabled.   Patient has a 10 grade education.   Patient is right-handed.   Patient drinks 1 or 2 sodas and tea per week.    Family History  Problem Relation Age of Onset  . Cancer Mother 48    Lung  . Diabetes Father   . Prostate cancer Father   . Hypertension Father   . Hypertension Brother   . Hypertension Brother   . Diabetes Brother   . Heart attack Neg Hx   . Stroke Neg Hx     Review of Systems:  As stated in the HPI and otherwise negative.   BP 110/68 mmHg  Pulse 76  Ht 5\' 10"  (1.778 m)  Wt 321 lb (145.605 kg)  BMI 46.06 kg/m2  SpO2 94%  Physical Examination: General: Well developed, well nourished, NAD HEENT: OP clear, mucus membranes moist SKIN: warm, dry. No rashes. Neuro: No focal deficits Musculoskeletal: Muscle strength 5/5 all ext Psychiatric: Mood and affect normal Neck: No JVD, no carotid bruits, no thyromegaly, no lymphadenopathy. Lungs:Clear bilaterally, no wheezes, rhonci, crackles Cardiovascular: Regular rate and rhythm. No murmurs, gallops or rubs. Abdomen:Soft. Bowel sounds present. Non-tender.  Extremities: 2+ right  lower extremity edema. Trace to 1+ edema left lower ext. Pulses are hard to palpate secondary to pt size. Venous stasis changes both lower ext.   Echo 04/07/12:  Left ventricle: The cavity size was normal. Wall thickness was increased in a pattern of mild LVH. Systolic function was normal. The estimated ejection fraction was in the range of 55% to 60%. Wall motion  was normal; there were no regional wall motion abnormalities. Left ventricular diastolic function parameters were normal. - Left atrium: The atrium was mildly dilated.  EKG:  EKG is ordered today. The ekg ordered today demonstrates NSR, rate 72 bpm.   Recent Labs: 03/22/2015: ALT 10; TSH 2.078 08/03/2015: BUN 13; Creatinine, Ser 0.68; Potassium 3.8; Sodium 138 08/30/2015: Hemoglobin 14.2; Platelets 252   Lipid Panel    Component Value Date/Time   CHOL 151 03/22/2015 1738   TRIG 112 03/22/2015  1738   HDL 41 03/22/2015 1738   CHOLHDL 3.7 03/22/2015 1738   VLDL 22 03/22/2015 1738   LDLCALC 88 03/22/2015 1738   LDLDIRECT 82 05/01/2013 1545     Wt Readings from Last 3 Encounters:  12/08/15 321 lb (145.605 kg)  10/24/15 315 lb (142.883 kg)  08/30/15 315 lb (142.883 kg)     Other studies Reviewed: Additional studies/ records that were reviewed today include: . Review of the above records demonstrates:    Assessment and Plan:   1. History of pulmonary embolism: He will need lifelong anti-coagulation. He has been therapeutic on coumadin.  He is followed in our coumadin clinic. I see him once yearly since he is followed in our coumadin clinic.   2. Lower extremity edema: Likely secondary to venous stasis disease and chronic DVT. Will continue coumadin for lifetime given his history of DVT, PE and immobility. He has an IVC filter in place. He has only been using Lasix prn. I have encouraged him to use more often if his weight increases or his LE edema increases. Elevate legs when sitting. I have nothing to offer and I suspect his venous damage from his recurrent DVT is irreversible.   Current medicines are reviewed at length with the patient today.  The patient does not have concerns regarding medicines.  The following changes have been made:  no change  Labs/ tests ordered today include:  No orders of the defined types were placed in this encounter.     Disposition:   FU with me  in 12  months   Signed, Lauree Chandler, MD 12/08/2015 1:17 PM    [facility] [address], [place], [place]  [address] Phone: [phone]; Fax: [phone]

## 2015-12-08 NOTE — Patient Instructions (Signed)

## 2015-12-13 ENCOUNTER — Telehealth (INDEPENDENT_AMBULATORY_CARE_PROVIDER_SITE_OTHER): Payer: Commercial Managed Care - HMO | Admitting: Cardiovascular Disease

## 2015-12-13 DIAGNOSIS — Z86711 Personal history of pulmonary embolism: Secondary | ICD-10-CM

## 2015-12-13 DIAGNOSIS — Z7901 Long term (current) use of anticoagulants: Secondary | ICD-10-CM | POA: Diagnosis not present

## 2015-12-13 NOTE — Telephone Encounter (Signed)
Will check and see if it is in my folder this week. Nicolas [name]

## 2015-12-13 NOTE — Telephone Encounter (Signed)
Please see Glean Salen note below, we do not allow pt's to self check especially non-compliant pt's like this one. Please do not authorize this pt to have a POCT machine.  Thanks

## 2015-12-13 NOTE — Telephone Encounter (Signed)
Elmyra Ricks at [facility] calling to inform Dr Angelena Form and nurse that they faxed a Physician Order Form for PT/INR machine, so the pt can check his levels at home.  Per Elmyra Ricks, she faxed this order form to [phone].  Went to try and locate MD order form, and informed Elmyra Ricks that I could not locate this information.  Informed Elmyra Ricks that she should re-fax this order to our office at [phone] attention Dr Angelena Form and Fraser Din RN.  Informed Elmyra Ricks that both Dr Angelena Form and his nurse have both been out of the office for the past 2 days, for that could be the delay in this form being signed and re-faxed.  Informed Elmyra Ricks that I will send this message to Dr Angelena Form and nurse for further review and follow-up.  Elmyra Ricks verbalized understanding and agrees with this plan.

## 2015-12-13 NOTE — Telephone Encounter (Signed)
New message      Calling to check the status on the physician order form for pt to check his inr at home.  Please call

## 2015-12-13 NOTE — Telephone Encounter (Signed)
Attempted to return call but no answer.  We currently do not allow our pt's to self-test.  Pt is also very non-compliant with his follow up appointments.

## 2015-12-14 NOTE — Telephone Encounter (Signed)
Voicemail on call back number is not Elmyra Ricks. I did not leave a message.  I spoke with pt and told him we could not authorize self testing.  He would like to schedule appt with coumadin clinic.  Will forward to coumadin clinic to contact pt regarding appt.

## 2015-12-14 NOTE — Telephone Encounter (Signed)
Nicolas [name], Can we let this patient know that we cannot authorize self testing? Thanks, chris

## 2015-12-14 NOTE — Telephone Encounter (Signed)
Called pt to schedule appt. Pt states he will come on Feb 6th. Asked if we could see him sooner, but he again stated he would come on Feb 6th. Inquired about having enough medication to last until that visit and he stated he did have enough to get to that appt. Appt scheduled.

## 2016-01-02 ENCOUNTER — Ambulatory Visit (INDEPENDENT_AMBULATORY_CARE_PROVIDER_SITE_OTHER): Payer: Commercial Managed Care - HMO | Admitting: *Deleted

## 2016-01-02 DIAGNOSIS — Z86711 Personal history of pulmonary embolism: Secondary | ICD-10-CM

## 2016-01-02 DIAGNOSIS — I824Y3 Acute embolism and thrombosis of unspecified deep veins of proximal lower extremity, bilateral: Secondary | ICD-10-CM

## 2016-01-02 DIAGNOSIS — Z7901 Long term (current) use of anticoagulants: Secondary | ICD-10-CM

## 2016-01-02 DIAGNOSIS — R600 Localized edema: Secondary | ICD-10-CM | POA: Diagnosis not present

## 2016-01-02 LAB — POCT INR: INR: 4

## 2016-01-02 MED ORDER — WARFARIN SODIUM 10 MG PO TABS: ORAL_TABLET | ORAL | Status: DC

## 2016-01-06 ENCOUNTER — Telehealth: Payer: Self-pay | Admitting: Cardiovascular Disease

## 2016-01-06 NOTE — Telephone Encounter (Signed)
[place] requesting a clarification, wanting to know if Dr. Angelena Form is aware of Warfarin 10 mg tablet and Diclofenac 75 mg BID, may cause increase GI bleeding. Please confirm that doctor is aware of this interaction and ok to refill. Please advise

## 2016-01-06 NOTE — Telephone Encounter (Signed)
[facility] pharmacy contact # [phone], reference # [id number]. Thank you

## 2016-01-06 NOTE — Telephone Encounter (Signed)
Reviewed with Fuller Canada, PharmD and they will talk with pt at coumadin clinic appt later this month about possible change to Voltaren gel.

## 2016-01-06 NOTE — Telephone Encounter (Signed)
Reviewed with coumadin clinic and Dr. Angelena Form and OK to continue these medications.  I spoke with pharmacist at [facility] and gave them this information.

## 2016-01-25 ENCOUNTER — Other Ambulatory Visit: Payer: Self-pay | Admitting: Family Medicine

## 2016-01-25 ENCOUNTER — Telehealth: Payer: Self-pay

## 2016-01-25 ENCOUNTER — Telehealth: Payer: Self-pay | Admitting: Pharmacist

## 2016-01-25 DIAGNOSIS — M545 Low back pain: Secondary | ICD-10-CM

## 2016-01-25 MED ORDER — DICLOFENAC SODIUM 1 % TD GEL: 2.0000 g | Freq: Four times a day (QID) | TRANSDERMAL | Status: DC

## 2016-01-25 NOTE — Telephone Encounter (Signed)
The patient called to request a change to his prescription for diclofenac (VOLTAREN) 75 MG EC tablet.  He said he went to the coumadin clinic, and they told him that this medication interacts with Coumadin and needs to be changed.  He said it thins the blood further and can cause stomach bleeds.  They told him that there is a gel form that he can rub on his spine.  He would like a prescription for that version of diclofenac. Please advise. Thank you.  CB #: [phone]

## 2016-01-25 NOTE — Telephone Encounter (Signed)
Spoke with Pt and he was advised

## 2016-01-25 NOTE — Telephone Encounter (Signed)
Please let patient know that I have sent in a prescription for the diclofenac gel for him to use instead of the tablets.

## 2016-01-25 NOTE — Telephone Encounter (Signed)
Called pt to reschedule Coumadin appt since he was a no show on 2/20. Scheduled for Monday 3/6. Also asked pt about diclofenac tablets - he reports that he does take this twice a day for arthritic pain in his back and spine. He reports that tramadol and Tylenol do not work for him. Discussed possibility of Voltaren gel as an alternative to help minimize risk for GI bleed since he is also on Coumadin. He has an appt with his MD in the next month and will ask them about switching to the gel.

## 2016-01-30 ENCOUNTER — Other Ambulatory Visit: Payer: Self-pay

## 2016-01-30 ENCOUNTER — Ambulatory Visit (INDEPENDENT_AMBULATORY_CARE_PROVIDER_SITE_OTHER): Payer: Commercial Managed Care - HMO | Admitting: Pharmacist

## 2016-01-30 DIAGNOSIS — R600 Localized edema: Secondary | ICD-10-CM

## 2016-01-30 DIAGNOSIS — I824Y3 Acute embolism and thrombosis of unspecified deep veins of proximal lower extremity, bilateral: Secondary | ICD-10-CM

## 2016-01-30 DIAGNOSIS — Z86711 Personal history of pulmonary embolism: Secondary | ICD-10-CM

## 2016-01-30 DIAGNOSIS — Z7901 Long term (current) use of anticoagulants: Secondary | ICD-10-CM | POA: Diagnosis not present

## 2016-01-30 DIAGNOSIS — Z7409 Other reduced mobility: Secondary | ICD-10-CM

## 2016-01-30 LAB — POCT INR: INR: 2

## 2016-01-30 NOTE — Progress Notes (Signed)
This encounter was created in error - please disregard.

## 2016-01-30 NOTE — Patient Outreach (Signed)
[name]  01/30/2016  Nicolas [name] August 05, 1966 [id number]   Care Coordination Services.   Contact call to Primary MD Office:  [name] Office contact: Jon Gills RN CM notified MD of DME need RN CM requested order for new hospital bed mattress replacement be sent to provider:[facility] (please mention patient high risk for skin breakdown due to W/C bound status and mobility issues).   Mariann Laster, RN, BSN, [facility], CCM  [employer] Management Coordinator [phone] Direct [phone] Cell [phone] Office [phone] Fax

## 2016-01-30 NOTE — Patient Outreach (Addendum)
[facility]) Care Management  01/30/2016  LYRICK PAGEAU 1966-08-02 [id number]  Referral Date:  01/18/2016 Source:  [facility] HMO tier 4 Issue:  DM with no admissions or ED visits.   Providers:   Primary MD: [name] last appt 08/30/2015.  Next appt 02/2016 HH: None Insurance: [employer]  Social: Contact call to patient for screening.  H/o past services with [facility] services during 2016.  Patient lives in his home with his 76 year old son.   Mobility: Patient with limited mobility due to H/O past 7-8 back surgeries since 2005.  Falls: 1 (12/2015) - no injury.   Pain: none Caregiver: 34 year old son, brother when visiting.  Transportation: W/C and city bus Resources: [employer]. States he has previously applied for Medicaid twice and did not meet eligibility approximately 2 years ago.  DME: walker, electric W/C, CBG meter, hospital bed;  Needs hospital bed mattress replacement - has a rip down the middle about 1 1/2 feet long.  Provider: Walker Kehr conditions:  DM Admissions: 0 ER visits: 0 BS 110  And taking No medications.  H/o FNP confirmed 10/25/15  patient is only required to manage his diabetes with diet only. No medications or blood sugar testing required. Last A1c was normal 5.9.   Patient agreed he would benefit from Diabetes Diet education.  States weight has been as low as 295 but currently averages between 300 - 325.    Medications:  Patient taking less than 10 medications  Co-pay cost issues: none  Flu Vaccine: 08/30/15 Pneumonia Vaccine:  PPSV2 11/26/2008 tDAP 06/18/2014  Consent: Patient agreed to [facility] services.   Plan:  Referral Date: 01/18/2016 Screening 01/30/2016 Telephonic RN CM 01/30/2016 Program:  DM 01/30/2016  Care Coordination:   [facility] Telephonic RN CM Referral will notify MD of DME need and request order for new hospital bed replacement be sent to provider:  [facility] (please mention patient high risk for skin breakdown due  to W/C bound status and mobility issues).  [place] Coach Referral planned once RN CM completes care coordination for Hospital Bed mattress replacement.     RN CM notified [place] Management Assistant: agreed to services/case opened. RN CM sent successful outreach letter and  [facility] Introductory package. RN CM advised in next scheduled contact call within the next 30 days.   RN CM advised to please notify MD of any changes in condition prior to scheduled appt's.   RN CM provided contact name and # [phone] or main office # [phone] and 24-hour nurse line # [phone].  RN CM confirmed patient is aware of 911 services for urgent emergency needs.  Mariann Laster, RN, BSN, [facility], CCM  [employer] Management Coordinator [phone] Direct [phone] Cell [phone] Office [phone] Fax

## 2016-02-02 ENCOUNTER — Other Ambulatory Visit: Payer: Self-pay

## 2016-02-02 DIAGNOSIS — M545 Low back pain: Secondary | ICD-10-CM

## 2016-02-02 MED ORDER — DICLOFENAC SODIUM 1 % TD GEL: 2.0000 g | Freq: Four times a day (QID) | TRANSDERMAL | Status: DC

## 2016-02-08 DIAGNOSIS — G8220 Paraplegia, unspecified: Secondary | ICD-10-CM | POA: Diagnosis not present

## 2016-02-08 DIAGNOSIS — M4806 Spinal stenosis, lumbar region: Secondary | ICD-10-CM | POA: Diagnosis not present

## 2016-02-08 DIAGNOSIS — M4804 Spinal stenosis, thoracic region: Secondary | ICD-10-CM | POA: Diagnosis not present

## 2016-02-08 DIAGNOSIS — E119 Type 2 diabetes mellitus without complications: Secondary | ICD-10-CM | POA: Diagnosis not present

## 2016-02-08 DIAGNOSIS — Z23 Encounter for immunization: Secondary | ICD-10-CM | POA: Diagnosis not present

## 2016-02-13 ENCOUNTER — Other Ambulatory Visit: Payer: Self-pay

## 2016-02-13 VITALS — BP 110/68 | Ht 70.0 in | Wt 321.0 lb

## 2016-02-13 DIAGNOSIS — E1110 Type 2 diabetes mellitus with ketoacidosis without coma: Secondary | ICD-10-CM

## 2016-02-13 NOTE — Patient Outreach (Signed)
[place] [facility]) Care Management  [place]  02/13/2016   Nicolas [name] [birth year] [id number]  Initial Assessment completed with patient.   Subjective:    Providers:  Primary MD: [name] last appt 08/30/2015. Next appt 02/2016 HH: None DME Provider:  [place]: [employer]  Social: Patient lives in his home with his 41 -49 year old son.   Mobility: Patient with limited mobility due to H/O past 7-8 back surgeries since 2005.  Falls: 1 (12/2015) - no injury.  Pain: none Caregiver: 79 year old son, brother when visiting.  Transportation: W/C and city bus Resources: [employer]. H/o previously applied for Medicaid twice and did not meet eligibility approximately 2 years ago.  DME: walker, electric W/C, CBG meter, hospital bed, mattress replaced 01/2016.  THN conditions: DM 2 ER/Admissions: 0 A1C 5.9 on 08/30/15 No medications.  H/o FNP confirmed 10/25/15 patient is only required to manage his diabetes with diet only. No medications or blood sugar testing required. Patient agreed he would benefit from Diabetes Diet education. States weight has been as low as 295 but currently averages between 300 - 325 and would like to get back to 295.    Medications:  Patient taking less than 10 medications  Co-pay cost issues: YES.  Diclofenac tablets changed to topical due to blood thinning side effects and patient on Coumadin.  Patient states tube of diclofenac gel cost $60.00 and not affordable.  Flu Vaccine: 08/30/15 Pneumonia Vaccine: PPSV2 11/26/2008 tDAP 06/18/2014  Objective:   Current Medications:  Current Outpatient Prescriptions  Medication Sig Dispense Refill  . diclofenac sodium (VOLTAREN) 1 % GEL Apply 2 g topically 4 (four) times daily. 300 g 0  . furosemide (LASIX) 40 MG tablet Take 1 tablet (40 mg total) by mouth daily. 90 tablet 3  . metoprolol succinate (TOPROL-XL) 50 MG 24 hr tablet Take 1 tablet (50 mg  total) by mouth every evening. Take with or immediately following a meal. 90 tablet 3  . Multiple Vitamin (MULTIVITAMIN WITH MINERALS) TABS tablet Take 1 tablet by mouth daily. Reported on 11/09/2015    . potassium chloride SA (K-DUR,KLOR-CON) 20 MEQ tablet Take 1 tablet (20 mEq total) by mouth daily. 90 tablet 3  . sertraline (ZOLOFT) 100 MG tablet Take 100 mg by mouth daily. Takes 1.5 tablets at bedtime.    Marland Kitchen warfarin (COUMADIN) 10 MG tablet Take as directed by coumadin clinic 80 tablet 0   No current facility-administered medications for this visit.    Functional Status:  In your present state of health, do you have any difficulty performing the following activities: 02/13/2016 11/09/2015  Hearing? N N  Vision? N N  Difficulty concentrating or making decisions? N N  Walking or climbing stairs? Y Y  Dressing or bathing? N -  Doing errands, shopping? Y N  Preparing Food and eating ? N Y  Using the Toilet? N (No Data)  In the past six months, have you accidently leaked urine? N Y  Do you have problems with loss of bowel control? N Y  Managing your Medications? N N  Managing your Finances? N N  Housekeeping or managing your Housekeeping? [name]    Fall/Depression Screening: PHQ 2/9 Scores 02/13/2016 01/30/2016 11/16/2015 11/09/2015 10/24/2015 08/30/2015 07/05/2015  PHQ - 2 Score 0 0 1 2 0 0 0   Fall Risk  02/13/2016 01/30/2016 11/09/2015 10/24/2015 08/30/2015  Falls in the past year? Yes Yes Yes Yes Yes  Number falls in past  yr: 2 or more 2 or more 2 or more 1 2 or more  Injury with Fall? No No No No No  Risk Factor Category  High Fall Risk High Fall Risk - - High Fall Risk  Risk for fall due to : History of fall(s);Impaired balance/gait;Impaired mobility History of fall(s);Impaired balance/gait;Impaired mobility History of fall(s) Impaired balance/gait;Impaired mobility Impaired mobility;Impaired balance/gait;History of fall(s)  Follow up Falls evaluation completed;Education provided;Falls  prevention discussed Falls evaluation completed;Education provided;Falls prevention discussed Falls prevention discussed;Education provided Falls prevention discussed -     Assessment:  Patient received replacement hospital bed mattress:  Issue resolved.  Patient requires only diet management for DM 2 diagnosis but high risk due to obesity.  DM education and preventive management needed.  Medication cost issues.   Plan:  Referral Date: 01/18/2016 Screening 01/30/2016 Telephonic RN CM 01/30/2016 Program: DM 01/30/2016 Initial Assessment:  02/13/2016  DM2 RN CM will follow-up again next call for further discussion regarding; (patient to provide update on next contact call).  -CBG meter age and name -last and next appt date: Diabetic eye exam, Diabetic foot exam and Dentist.  RN CM provided Emmi Education   Medication:  [place] Referral -medication cost  Advance Directive RN CM mailed copy of Advance Directive 02/13/2016   RN CM advised in next scheduled contact call within the next 30 days.  RN CM advised to please notify MD of any changes in condition prior to scheduled appt's.  RN CM provided contact name and # [phone] or main office # [phone] and 24-hour nurse line # [phone].  RN CM confirmed patient is aware of 911 services for urgent emergency needs.  [facility] CM Care Plan Problem One        Most Recent Value   Care Plan Problem One  DM 2   Role Documenting the Problem One  Care Management Telephonic Coordinator   Care Plan for Problem One  Active   THN Long Term Goal (31-90 days)  Patient will increase DM management of diet by reading Emmi education over the next 31-90 days.    THN Long Term Goal Start Date  02/13/16   THN Long Term Goal Met Date  02/13/16   Interventions for Problem One Long Term Goal  RN CM will provide patient with Emmi Education on DM diet management over the next 31-90 days.    THN CM Short Term Goal #1 (0-30 days)  Patient will  modify diet to create a 2 -3 lb weight loss over the next 30 days.    THN CM Short Term Goal #1 Start Date  02/13/16   [facility] CM Short Term Goal #1 Met Date  02/13/16   Interventions for Short Term Goal #1  RN CM will provide education on importance of weighing and logging over the next 30 days.    THN CM Short Term Goal #2 (0-30 days)  Patient will review Advance Directive as part of his healthcare manage over the next 30 days.    THN CM Short Term Goal #2 Start Date  02/13/16   Interventions for Short Term Goal #2  RN CM will mail advance directive to patient over the next 30 days.     [facility] CM Care Plan Problem Two        Most Recent Value   Care Plan Problem Two  Medication Cost issues   Role Documenting the Problem Two  Care Management Telephonic Coordinator   Care Plan for Problem Two  Active  THN CM Short Term Goal #1 (0-30 days)  Patient will engage with [facility] Pharmacist to address medication cost issues associated to diclofenac gel over the next 30 dyas.    THN CM Short Term Goal #1 Start Date  02/13/16   THN CM Short Term Goal #1 Met Date   -- [Goal continued]   Interventions for Short Term Goal #2   RN CM will send [place] Referral within the next 30 days.     THN CM Care Plan Problem Three        Most Recent Value   Care Plan Problem Three  Frequent Falls.   Role Documenting the Problem Three  Care Management Telephonic Coordinator   Care Plan for Problem Three  Active   THN CM Short Term Goal #1 (0-30 days)  Patient will report no injuries associated to falls over the next 30 days.    THN CM Short Term Goal #1 Start Date  01/30/16   THN CM Short Term Goal #1 Met Date  -- [Goal continued]   Interventions for Short Term Goal #1  RN CM will provide education on fall prevention over the next 30 days.        Mariann Laster, RN, BSN, [facility], CCM  [employer] Management Coordinator [phone] Direct [phone] Cell [phone]  Office [phone] Fax

## 2016-02-14 ENCOUNTER — Other Ambulatory Visit: Payer: Self-pay

## 2016-02-14 NOTE — Patient Outreach (Signed)
[place]) Care Management  02/14/2016  ABDULBASIT MCCOURY [birth year] [id number]   Emmi Education mailed to patient 02/14/2016 -Diabetes type 2 - meal planning -Counting carbohydrates -Low-salt diet  Mariann Laster, RN, BSN, [facility], [place] Management Care Management Coordinator [phone] Direct [phone] Cell [phone] Office [phone] Fax

## 2016-02-28 ENCOUNTER — Other Ambulatory Visit: Payer: Self-pay | Admitting: Pharmacist

## 2016-02-28 ENCOUNTER — Ambulatory Visit (INDEPENDENT_AMBULATORY_CARE_PROVIDER_SITE_OTHER): Payer: Commercial Managed Care - HMO | Admitting: Family Medicine

## 2016-02-28 ENCOUNTER — Encounter: Payer: Self-pay | Admitting: Family Medicine

## 2016-02-28 VITALS — BP 138/85 | HR 87 | Temp 98.3°F | Resp 16

## 2016-02-28 DIAGNOSIS — M4806 Spinal stenosis, lumbar region: Secondary | ICD-10-CM | POA: Diagnosis not present

## 2016-02-28 DIAGNOSIS — M4714 Other spondylosis with myelopathy, thoracic region: Secondary | ICD-10-CM

## 2016-02-28 DIAGNOSIS — M4804 Spinal stenosis, thoracic region: Secondary | ICD-10-CM

## 2016-02-28 DIAGNOSIS — I10 Essential (primary) hypertension: Secondary | ICD-10-CM | POA: Diagnosis not present

## 2016-02-28 DIAGNOSIS — M4716 Other spondylosis with myelopathy, lumbar region: Secondary | ICD-10-CM | POA: Diagnosis not present

## 2016-02-28 DIAGNOSIS — M48061 Spinal stenosis, lumbar region without neurogenic claudication: Secondary | ICD-10-CM

## 2016-02-28 DIAGNOSIS — K0510 Chronic gingivitis, plaque induced: Secondary | ICD-10-CM | POA: Diagnosis not present

## 2016-02-28 MED ORDER — METOPROLOL SUCCINATE ER 50 MG PO TB24: 50.0000 mg | ORAL_TABLET | Freq: Every evening | ORAL | Status: DC

## 2016-02-28 NOTE — Patient Outreach (Signed)
[place] [facility]) Care Management  [place]   02/28/2016  Nicolas [name] Apr 08, 1966 [id number]   Nicolas [name] is a 50yo who was referred to [place] for medication assistance.  Per referral, diclofenac tablets were changed to topical diclofenac, and patient reports he is unable to afford topical diclofenac gel.    I made outreach call to patient to discuss medication assistance.  I was unable to reach him.  I left a HIPAA compliant voicemail for patient to return my call.  I will make a second outreach attempt within one week if patient does not return my phone call.    Elisabeth Most, Pharm.D. Pharmacy Resident [place] [phone]

## 2016-02-28 NOTE — Patient Instructions (Addendum)
We will call you about an appointment with [facility]     IF you received an x-ray today, you will receive an invoice from [facility]. Please contact [facility] at [phone] with questions or concerns regarding your invoice.   IF you received labwork today, you will receive an invoice from [employer]. Please contact [facility] at [phone] with questions or concerns regarding your invoice.   Our billing staff will not be able to assist you with questions regarding bills from these companies.  You will be contacted with the lab results as soon as they are available. The fastest way to get your results is to activate your My Chart account. Instructions are located on the last page of this paperwork. If you have not heard from [place] regarding the results in 2 weeks, please contact this office.

## 2016-02-28 NOTE — Progress Notes (Signed)
Subjective:    Patient ID: Nicolas [name], male    DOB: [birth year], 50 y.o.   MRN: [id number]  HPI This is a pleasant 49 yo male who presents today for follow up if HTN/elevated blood sugar. Is doing well. Continues to have quite a bit of back pain and is interested in trying some different types of therapies to improve strength, flexibility and balance. He has noticed that as he increases his activity level he feels better and would like to learn exercise techniques that he can use on his own. Pharmacist working with him to get diclofenac gel which is unaffordable for him. He had to stop diclofenac oral due to warfarin use.   He lives with his 27 yo son and his brother. He uses his scooter and the city bus for transportation. He has not had any recent falls. He does the cooking and shopping. He drinks soda, Kool-aid and some water. He has been given some nutrition information which he found helpful.   He continues to be followed at the coumadin clinic and has been on stable dose of warfarin. He has not had any excessive or unusual bleeding. Has occasional bleeding of gums.    Past Medical History  Diagnosis Date  . Arthritis   . Clotting disorder ([place])   . Hypertension   . Pulmonary embolism ([place])     x 3 in 2005, 2008, 2010  . DVT (deep venous thrombosis) ([place])     Summer 2012  . Spinal stenosis   . Depression   . Diabetes mellitus without complication ([place])   . Sleep apnea   . History of home oxygen therapy     uses mouthpiece ventilation with 2 liters supplemental oxygen all the time   Past Surgical History  Procedure Laterality Date  . Cholecystectomy N/A 05/24/2014    Procedure: LAPAROSCOPIC CHOLECYSTECTOMY;  Surgeon: Zenovia Jarred, MD;  Location: [facility];  Service: General;  Laterality: N/A;  . Ercp N/A 05/25/2014    Procedure: ENDOSCOPIC RETROGRADE CHOLANGIOPANCREATOGRAPHY (ERCP);  Surgeon: Beryle Beams, MD;  Location: [facility];  Service: Endoscopy;  Laterality: N/A;  .  Spine surgery  11/30/11    Total 6 back surgeries  . Spine surgery  [id number]    Dr. Cathren Laine in [place], [place]  . Ivp filter  jan 2012  . Ercp N/A 07/16/2014    Procedure: ENDOSCOPIC RETROGRADE CHOLANGIOPANCREATOGRAPHY (ERCP);  Surgeon: Beryle Beams, MD;  Location[facility];  Service: Endoscopy;  Laterality: N/A;   Family History  Problem Relation Age of Onset  . Cancer Mother 16    Lung  . Diabetes Father   . Prostate cancer Father   . Hypertension Father   . Hypertension Brother   . Hypertension Brother   . Diabetes Brother   . Heart attack Neg Hx   . Stroke Neg Hx    Social History  Substance Use Topics  . Smoking status: Former Smoker -- 1.50 packs/day for 10 years    Types: Cigarettes    Quit date: 04/26/2001  . Smokeless tobacco: Never Used  . Alcohol Use: 0.5 oz/week    1 drink(s) per week     Comment: very little      Review of Systems No chest pain, no SOB, intermittent LE edema, no falls, + back/neck pain daily    Objective:   Physical Exam  Constitutional: He is oriented to person, place, and time. He appears well-developed and well-nourished. No distress.  Sitting in scooter.  HENT:  Head: Normocephalic and atraumatic.  Eyes: Conjunctivae are normal.  Cardiovascular: Normal rate, regular rhythm and normal heart sounds.   Pulmonary/Chest: Effort normal and breath sounds normal.  Musculoskeletal: He exhibits edema (trace pretibial edema).  Neurological: He is alert and oriented to person, place, and time.  Skin: Skin is warm and dry. He is not diaphoretic.  Psychiatric: He has a normal mood and affect. His behavior is normal. Judgment and thought content normal.  Vitals reviewed.   BP 138/85 mmHg  Pulse 87  Temp(Src) 98.3 F (36.8 C)  Resp 16 Depression screen [facility] 2/9 02/28/2016 02/13/2016 01/30/2016 11/16/2015 11/09/2015  Decreased Interest 0 0 0 1 2  Down, Depressed, Hopeless 0 0 0 0 0  PHQ - 2 Score 0 0 0 1 2        Assessment & Plan:    1. Spinal stenosis of lumbar region - Ambulatory referral to Physical Therapy  2. Spinal stenosis of thoracic region - Ambulatory referral to Physical Therapy  3. Essential hypertension - well controlled - metoprolol succinate (TOPROL-XL) 50 MG 24 hr tablet; Take 1 tablet (50 mg total) by mouth every evening. Take with or immediately following a meal.  Dispense: 90 tablet; Refill: 3  4. Morbid obesity due to excess calories ([place]) - reviewed some basic nutrition principles and encouraged him to completely eliminate soda and other sweetened beverages from his diet.   5. Lumbar spondylosis with myelopathy - Ambulatory referral to Physical Therapy  6. Thoracic spondylosis with myelopathy - Ambulatory referral to Physical Therapy  7. Gingivitis - he is not currently flossing and I encouraged him to floss daily and brush twice a day  - follow up in 6 months for CPE  [name], FNP-BC  [facility]  03/01/2016 10:07 AM

## 2016-02-29 ENCOUNTER — Ambulatory Visit (INDEPENDENT_AMBULATORY_CARE_PROVIDER_SITE_OTHER): Payer: Commercial Managed Care - HMO | Admitting: *Deleted

## 2016-02-29 ENCOUNTER — Other Ambulatory Visit: Payer: Self-pay | Admitting: Pharmacist

## 2016-02-29 DIAGNOSIS — I824Y3 Acute embolism and thrombosis of unspecified deep veins of proximal lower extremity, bilateral: Secondary | ICD-10-CM | POA: Diagnosis not present

## 2016-02-29 DIAGNOSIS — R600 Localized edema: Secondary | ICD-10-CM

## 2016-02-29 DIAGNOSIS — Z7901 Long term (current) use of anticoagulants: Secondary | ICD-10-CM

## 2016-02-29 DIAGNOSIS — Z86711 Personal history of pulmonary embolism: Secondary | ICD-10-CM | POA: Diagnosis not present

## 2016-02-29 LAB — POCT INR: INR: 2.7

## 2016-02-29 NOTE — Patient Outreach (Signed)
[place] [facility]) Care Management  [employer]   03/01/2016  TARQUIN HAPP 09-23-1966 [id number]  Subjective: Desire Pedder is a 50yo who was referred to [place] for medication assistance.  Per referral, diclofenac tablets were changed to topical diclofenac, and patient reports he is unable to afford topical diclofenac gel.    I received a return phone call from patient who confirms that he was taken off diclofenac tablets and put on diclofenac gel.  Patient reports he has [employer], and reports copay is almost $100 for topical diclofenac.  Patient reports he fills all other prescriptions through [facility] mail order and has zero copay for other medications.    Objective:   Encounter Medications: Outpatient Encounter Prescriptions as of 02/29/2016  Medication Sig Note  . furosemide (LASIX) 40 MG tablet Take 1 tablet (40 mg total) by mouth daily. 11/09/2015: Reports takes as needed  . metoprolol succinate (TOPROL-XL) 50 MG 24 hr tablet Take 1 tablet (50 mg total) by mouth every evening. Take with or immediately following a meal.   . Multiple Vitamin (MULTIVITAMIN WITH MINERALS) TABS tablet Take 1 tablet by mouth daily. Reported on 11/09/2015   . potassium chloride SA (K-DUR,KLOR-CON) 20 MEQ tablet Take 1 tablet (20 mEq total) by mouth daily. 11/09/2015: Takes as needed with furosemide.  . sertraline (ZOLOFT) 100 MG tablet Take 100 mg by mouth daily. Takes 1.5 tablets at bedtime.   Marland Kitchen warfarin (COUMADIN) 10 MG tablet Take as directed by coumadin clinic    No facility-administered encounter medications on file as of 02/29/2016.    Functional Status: In your present state of health, do you have any difficulty performing the following activities: 02/13/2016 11/09/2015  Hearing? N N  Vision? N N  Difficulty concentrating or making decisions? N N  Walking or climbing stairs? Y Y  Dressing or bathing? N -  Doing errands, shopping? Y N  Preparing Food and  eating ? N Y  Using the Toilet? N (No Data)  In the past six months, have you accidently leaked urine? N Y  Do you have problems with loss of bowel control? N Y  Managing your Medications? N N  Managing your Finances? N N  Housekeeping or managing your Housekeeping? [name]    Fall/Depression Screening: PHQ 2/9 Scores 02/28/2016 02/13/2016 01/30/2016 11/16/2015 11/09/2015 10/24/2015 08/30/2015  PHQ - 2 Score 0 0 0 1 2 0 0    Assessment: 1.  Medication assistance:  Patient has difficulty affording diclofenac gel (Voltaren gel).  Voltaren gel is a tier 4 medication under patient's insurance plan.  Discussed options for medication assistance such as patient assistance program, Extra Help, and tier exemption.  I informed patient that there are currently no patient assistance programs for Voltaren gel.  Patient reports he has never applied for Extra Help through social security.  Patient states he does not have time to apply today.  Will assist patient in applying for Extra Help.  If patient does not qualify, will contact patient's provider to consider completing a tier exemption form for Voltaren gel.    Plan: 1.  Medication assistance:  Scheduled phone call for 03/02/16 at 10:00 AM to assist patient in completing Extra Help application.    [facility] CM Care Plan Problem One        Most Recent Value   Care Plan Problem One  Medication assistance   Role Documenting the Problem One  Clinical Pharmacist   Care Plan for Problem One  Active   THN CM Short Term Goal #1 (0-30 days)  Patient will complete application for Extra Help within the next 30 days.    THN CM Short Term Goal #1 Start Date  03/01/16   Interventions for Short Term Goal #1  Discussed options for medication assistance. Scheduled appointment to assist patient in completing Extra Help application.       Elisabeth Most, Pharm.D. Pharmacy Resident [place] [phone]

## 2016-03-02 ENCOUNTER — Other Ambulatory Visit: Payer: Self-pay | Admitting: Pharmacist

## 2016-03-02 NOTE — Patient Outreach (Signed)
[facility]) Care Management  [place]   03/02/2016  Nicolas [name] 1966-09-11 [id number]  Subjective: Nicolas [name] is a 50yo who was referred to [place] for medication assistance.  Per referral, diclofenac tablets were changed to topical diclofenac, and patient reports he is unable to afford topical diclofenac gel.   I made outreach call to patient to completed Extra Help application.    Objective:   Encounter Medications: Outpatient Encounter Prescriptions as of 03/02/2016  Medication Sig Note  . calamine lotion  03/01/2016: Received from: External Pharmacy  . Calcium Carbonate Antacid 600 MG chewable tablet  03/02/2016: Takes as needed  . Camphor-Eucalyptus-Menthol (MEDICATED CHEST RUB) 4.73-1.2-2.6 % OINT  03/01/2016: Received from: External Pharmacy  . diclofenac sodium (VOLTAREN) 1 % GEL Reported on 03/02/2016 03/01/2016: Received from: External Pharmacy  . furosemide (LASIX) 40 MG tablet Take 1 tablet (40 mg total) by mouth daily. 11/09/2015: Reports takes as needed  . metoprolol succinate (TOPROL-XL) 50 MG 24 hr tablet Take 1 tablet (50 mg total) by mouth every evening. Take with or immediately following a meal.   . Multiple Vitamin (MULTIVITAMIN WITH MINERALS) TABS tablet Take 1 tablet by mouth daily. Reported on 11/09/2015   . potassium chloride SA (K-DUR,KLOR-CON) 20 MEQ tablet Take 1 tablet (20 mEq total) by mouth daily. 11/09/2015: Takes as needed with furosemide.  . sertraline (ZOLOFT) 100 MG tablet Take 100 mg by mouth daily. Takes 1.5 tablets at bedtime.   Marland Kitchen warfarin (COUMADIN) 10 MG tablet Take as directed by coumadin clinic   . Loratadine 10 MG CAPS Reported on 03/02/2016 03/01/2016: Received from: External Pharmacy   No facility-administered encounter medications on file as of 03/02/2016.    Functional Status: In your present state of health, do you have any difficulty performing the following activities: 02/13/2016 11/09/2015  Hearing? N N  Vision?  N N  Difficulty concentrating or making decisions? N N  Walking or climbing stairs? Y Y  Dressing or bathing? N -  Doing errands, shopping? Y N  Preparing Food and eating ? N Y  Using the Toilet? N (No Data)  In the past six months, have you accidently leaked urine? N Y  Do you have problems with loss of bowel control? N Y  Managing your Medications? N N  Managing your Finances? N N  Housekeeping or managing your Housekeeping? [name]    Fall/Depression Screening: PHQ 2/9 Scores 02/28/2016 02/13/2016 01/30/2016 11/16/2015 11/09/2015 10/24/2015 08/30/2015  PHQ - 2 Score 0 0 0 1 2 0 0    Assessment: 1.  Medication assistance:  Patient has difficulty affording diclofenac gel (Voltaren gel).  Voltaren gel is a tier 4 medication under patient's insurance plan.  I assisted patient in completing Extra Help application today.  Patient should receive a letter from social security administration regarding status of application within 2-3 weeks.  Advised patient to call me when he receives letter.  I patient does not receive Extra Help, will contact patient's provider to consider completing a tier exemption form for Voltaren gel.    Plan: 1.  Extra Help application submitted.  Patient will call me once he receives letter regarding status of application.   2.  I will reach out to patient in two weeks if I have not heard from him   [facility] CM Care Plan Problem One        Most Recent Value   Care Plan Problem One  Medication assistance   Role Documenting  the Problem One  Clinical Pharmacist   Care Plan for Problem One  Active   THN CM Short Term Goal #1 (0-30 days)  Patient will complete application for Extra Help within the next 30 days.    THN CM Short Term Goal #1 Start Date  03/01/16   Interventions for Short Term Goal #1  Patient completed Extra Help applicatoin and should receive letter from social security regarding status of application within two weeks.  Patient will call me once he receives letter.       Elisabeth Most, Pharm.D. Pharmacy Resident [place] [phone]

## 2016-03-12 ENCOUNTER — Encounter: Payer: Self-pay | Admitting: Family Medicine

## 2016-03-12 ENCOUNTER — Other Ambulatory Visit: Payer: Self-pay

## 2016-03-12 DIAGNOSIS — Z9181 History of falling: Secondary | ICD-10-CM | POA: Insufficient documentation

## 2016-03-12 NOTE — Patient Outreach (Addendum)
Nicolas [name] [facility]) Care Management  03/12/2016  Nicolas [name] [birth year] [id number]   Telephonic Monthly Assessment completed with patient.   Subjective:  Patient states he completed MD appt 4/49/17 and not new issues of concern.   Patient estimates weight as 330 but has not been weighing at home (no scales).   Providers:  Primary MD: [name] last appt 02/2016 and next appt 08/2016 HH: None DME Provider: [place]: [employer] Medicare  Social: Patient lives in his home with his 49 -48 year old son.   Mobility: Patient with limited mobility due to H/O past 7-8 back surgeries since 2005.  Falls: 1 (12/2015) - no injury.  Caregiver: 49 year old son, brother when visiting.  Transportation: W/C and city bus Resources: [employer]. H/o previously applied for Medicaid twice and did not meet eligibility approximately 2 years ago.  DME: walker, electric W/C, CBG meter (not required to test), hospital bed, mattress replaced 01/2016. NO SCALES.  HTN Elevated BP 138/85 on last office visit 02/28/16.  Patient estimates weight as 330.   Patient does not have scales in the home but confirms he thinks he could weight himself while supporting his self on a walker.  States no changes in medications on last MD visit.   DM 2  - (Diet managed only) ER/Admissions: 0 A1C 5.9 on 08/30/15 No medications.  H/o weight has been as low as 295 but currently estimates his weight as 330.  H/o FNP confirmed 10/25/15 patient is only required to manage his diabetes with diet only. No medications or blood sugar testing required. Patient agreed he would benefit from Diabetes Diet education.  Mail returned.  (RN CM verified address and advised will mail out a 2nd time).    Medications:  Patient taking less than 10 medications  Co-pay cost issues: YES. Diclofenac tablets changed to topical due to blood thinning side effects and patient on Coumadin. Patient states  tube of diclofenac gel cost $60.00 and not affordable.  [facility] Pharmacist is working with patient regarding this issue.  Patient waiting on arrival of mailed paperwork from pharmacist.  Flu Vaccine: 08/30/15 Pneumonia Vaccine: PPSV2 11/26/2008 tDAP 06/18/2014  Objective:   Encounter Medications:  Outpatient Encounter Prescriptions as of 03/12/2016  Medication Sig Note  . calamine lotion  03/01/2016: Received from: External Pharmacy  . Calcium Carbonate Antacid 600 MG chewable tablet  03/02/2016: Takes as needed  . Camphor-Eucalyptus-Menthol (MEDICATED CHEST RUB) 4.73-1.2-2.6 % OINT  03/01/2016: Received from: External Pharmacy  . diclofenac sodium (VOLTAREN) 1 % GEL Reported on 03/02/2016 03/01/2016: Received from: External Pharmacy  . furosemide (LASIX) 40 MG tablet Take 1 tablet (40 mg total) by mouth daily. 11/09/2015: Reports takes as needed  . Loratadine 10 MG CAPS Reported on 03/02/2016 03/01/2016: Received from: External Pharmacy  . metoprolol succinate (TOPROL-XL) 50 MG 24 hr tablet Take 1 tablet (50 mg total) by mouth every evening. Take with or immediately following a meal.   . Multiple Vitamin (MULTIVITAMIN WITH MINERALS) TABS tablet Take 1 tablet by mouth daily. Reported on 11/09/2015   . potassium chloride SA (K-DUR,KLOR-CON) 20 MEQ tablet Take 1 tablet (20 mEq total) by mouth daily. 11/09/2015: Takes as needed with furosemide.  . sertraline (ZOLOFT) 100 MG tablet Take 100 mg by mouth daily. Takes 1.5 tablets at bedtime.   Marland Kitchen warfarin (COUMADIN) 10 MG tablet Take as directed by coumadin clinic    No facility-administered encounter medications on file as of 03/12/2016.    Functional Status:  In your present state of health, do you have any difficulty performing the following activities: 02/13/2016 11/09/2015  Hearing? N N  Vision? N N  Difficulty concentrating or making decisions? N N  Walking or climbing stairs? Y Y  Dressing or bathing? N -  Doing errands, shopping? Y N  Preparing Food and  eating ? N Y  Using the Toilet? N (No Data)  In the past six months, have you accidently leaked urine? N Y  Do you have problems with loss of bowel control? N Y  Managing your Medications? N N  Managing your Finances? N N  Housekeeping or managing your Housekeeping? [name]    Fall/Depression Screening: PHQ 2/9 Scores 03/12/2016 02/28/2016 02/13/2016 01/30/2016 11/16/2015 11/09/2015 10/24/2015  PHQ - 2 Score 0 0 0 0 1 2 0   Assessment:  Patient requires only diet management for DM 2 diagnosis but high risk due to obesity.  DM education and preventive management review needed once educational mailer received by patient. .  Medication cost issues: active with Pharmacy Services.   Plan:  Referral Date: 01/18/2016 Screening 01/30/2016 Initial Assessment: 02/13/2016 Telephonic RN CM 01/30/2016 Program:  -DM 01/30/2016 - 03/12/16 -HTN 03/12/16  HTN -No scales in the home.  RN CM will search for benefit for scales.    DM2 RN CM will follow-up again next call for further discussion regarding; (patient to provide update on next contact call).  -CBG meter age and name -last and next appt date: Diabetic eye exam, Diabetic foot exam and Dentist.  Emmi education mailed 02/13/16 and again 03/12/16.   -Diabetes type 2 - meal planning -Counting carbohydrates -Low-salt diet  Fall Risk Fall prevention and safety reviewed.    Medication:  -monitor pharmacy outcome.   Advance Directive RN CM mailed copy of Advance Directive 02/13/2016 and again 03/12/16 -Review next contact call.   RN CM advised in next scheduled contact call within the next 30 days.  RN CM advised to please notify MD of any changes in condition prior to scheduled appt's.  RN CM provided contact name and # [phone] or main office # [phone] and 24-hour nurse line # [phone].  RN CM confirmed patient is aware of 911 services for urgent emergency needs.  [facility] CM Care Plan Problem One        Most Recent Value    Care Plan Problem One  DM 2   Role Documenting the Problem One  Care Management Telephonic Coordinator   Care Plan for Problem One  Active   THN Long Term Goal (31-90 days)  Patient will increase DM management of diet by reading Emmi education over the next 31-90 days.    THN Long Term Goal Start Date  02/13/16   [facility] Long Term Goal Met Date  -- [Emmi Education mailed again 03/12/16. ]   Interventions for Problem One Long Term Goal  RN CM will provide patient with Emmi Education on DM diet management over the next 31-90 days.    THN CM Short Term Goal #1 (0-30 days)  Patient will modify diet to create a 2 -3 lb weight loss over the next 30 days.    THN CM Short Term Goal #1 Start Date  03/12/16   THN CM Short Term Goal #1 Met Date  -- [Goal not met: continued.]   Interventions for Short Term Goal #1  RN CM will provide education on importance of weighing and logging over the next 30 days.    THN CM  Short Term Goal #2 (0-30 days)  Patient will review Advance Directive as part of his healthcare manage over the next 30 days.    THN CM Short Term Goal #2 Start Date  03/12/16   THN CM Short Term Goal #2 Met Date  -- [Goal continued: mailed again 03/12/16.]   Interventions for Short Term Goal #2  RN CM will mail advance directive to patient over the next 30 days.     [facility] CM Care Plan Problem Two        Most Recent Value   Care Plan Problem Two  Medication Cost issues   Role Documenting the Problem Two  Care Management Telephonic Coordinator   Care Plan for Problem Two  Active   THN CM Short Term Goal #1 (0-30 days)  Patient will complete paperwork mailed by [facility] pharmacist over the next 30 days.    THN CM Short Term Goal #1 Start Date  03/12/16   [facility] CM Short Term Goal #1 Met Date   03/12/16   Interventions for Short Term Goal #2   RN CM will continue to encourage compliance with pharmacy recommendations pertaining to medication assistance paper work over the next 30 days.     THN CM Care Plan  Problem Three        Most Recent Value   Care Plan Problem Three  HTN   Role Documenting the Problem Three  Care Management Telephonic Coordinator   Care Plan for Problem Three  Active   THN CM Short Term Goal #1 (0-30 days)  Patient will improve knowledge by reading [employer] Educatonal on low salt diet to self manage HTN over the next 30 days.    THN CM Short Term Goal #1 Start Date  03/12/16   [facility] CM Short Term Goal #1 Met Date  03/12/16   Interventions for Short Term Goal #1  RN CM will mail Emmi Education over the next 30 days.    THN CM Short Term Goal #2 (0-30 days)  Patient will obtain scales over the next 30 days.    THN CM Short Term Goal #2 Start Date  03/12/16   Interventions for Short Term Goal #2  RN CM will seek out resources for home scales over the next 30 days.       Mariann Laster, RN, BSN, [facility], CCM  [employer] Management Coordinator [phone] Direct [phone] Cell [phone] Office [phone] Fax

## 2016-03-19 ENCOUNTER — Other Ambulatory Visit: Payer: Self-pay | Admitting: Pharmacist

## 2016-03-19 NOTE — Patient Outreach (Signed)
[facility]) Care Management  [place]   03/19/2016  Nicolas [name] 20-Apr-1966 [id number]  Subjective: Nicolas [name] is a 50yo who was referred to [place] for medication assistance.  Per referral, diclofenac tablets were changed to topical diclofenac, and patient reports he is unable to afford topical diclofenac gel.    I made outreach call to patient to follow up on Extra Help application that was submitted on 03/02/16.  Patient confirms he did receive a letter in the mail from social security stating he already receives Extra Help.    Objective:   Encounter Medications: Outpatient Encounter Prescriptions as of 03/19/2016  Medication Sig Note  . calamine lotion  03/01/2016: Received from: External Pharmacy  . Calcium Carbonate Antacid 600 MG chewable tablet  03/02/2016: Takes as needed  . Camphor-Eucalyptus-Menthol (MEDICATED CHEST RUB) 4.73-1.2-2.6 % OINT  03/01/2016: Received from: External Pharmacy  . diclofenac sodium (VOLTAREN) 1 % GEL Reported on 03/02/2016 03/01/2016: Received from: External Pharmacy  . furosemide (LASIX) 40 MG tablet Take 1 tablet (40 mg total) by mouth daily. 11/09/2015: Reports takes as needed  . Loratadine 10 MG CAPS Reported on 03/02/2016 03/01/2016: Received from: External Pharmacy  . metoprolol succinate (TOPROL-XL) 50 MG 24 hr tablet Take 1 tablet (50 mg total) by mouth every evening. Take with or immediately following a meal.   . Multiple Vitamin (MULTIVITAMIN WITH MINERALS) TABS tablet Take 1 tablet by mouth daily. Reported on 11/09/2015   . potassium chloride SA (K-DUR,KLOR-CON) 20 MEQ tablet Take 1 tablet (20 mEq total) by mouth daily. 11/09/2015: Takes as needed with furosemide.  . sertraline (ZOLOFT) 100 MG tablet Take 100 mg by mouth daily. Takes 1.5 tablets at bedtime.   Marland Kitchen warfarin (COUMADIN) 10 MG tablet Take as directed by coumadin clinic    No facility-administered encounter medications on file as of 03/19/2016.    Functional  Status: In your present state of health, do you have any difficulty performing the following activities: 02/13/2016 11/09/2015  Hearing? N N  Vision? N N  Difficulty concentrating or making decisions? N N  Walking or climbing stairs? Y Y  Dressing or bathing? N -  Doing errands, shopping? Y N  Preparing Food and eating ? N Y  Using the Toilet? N (No Data)  In the past six months, have you accidently leaked urine? N Y  Do you have problems with loss of bowel control? N Y  Managing your Medications? N N  Managing your Finances? N N  Housekeeping or managing your Housekeeping? [name]    Fall/Depression Screening: PHQ 2/9 Scores 03/12/2016 02/28/2016 02/13/2016 01/30/2016 11/16/2015 11/09/2015 10/24/2015  PHQ - 2 Score 0 0 0 0 1 2 0    Assessment: 1.  Medication assistance:  Patient has difficulty affording diclofenac gel (Voltaren gel).  Voltaren gel is a tier 4 medication under patient's insurance plan.  Patient reports today that he received a letter from social security that stated he already has Extra Help.  With patient's permission, I verified his Extra Help subsidy status and confirmed he has Full Extra Help.  Discussed with patient that copay should range from $3.30 to 8.25.  Made a three-way phone call to [facility] mail order pharmacy and identified that patient's prescription was being filled for diclofenac gel which is not on [place].  [employer] mail order pharmacy filled prescription for brand name Voltaren gel (tier 4) which reduced copay to $3.30.  Patient voices no further concerns regarding medications  at this time.    Plan: 1.  Will close pharmacy program as goals have been met, and patient denies any further pharmacy needs.  Will update [facility] CMRN [name] Hutchinson.     THN CM Care Plan Problem One        Most Recent Value   Care Plan Problem One  Medication assistance   Role Documenting the Problem One  Clinical Pharmacist   Care Plan for Problem One  Active   THN  CM Short Term Goal #1 (0-30 days)  Patient will complete application for Extra Help within the next 30 days.    THN CM Short Term Goal #1 Start Date  03/01/16   [place] CM Short Term Goal #1 Met Date  03/19/16   Interventions for Short Term Goal #1  Patient received letter from social security regarding Extra Help.  Identified that patient already receives Extra Help.      Elisabeth Most, Pharm.D. Pharmacy Resident [place] [phone]

## 2016-03-29 ENCOUNTER — Ambulatory Visit (INDEPENDENT_AMBULATORY_CARE_PROVIDER_SITE_OTHER): Payer: Commercial Managed Care - HMO | Admitting: *Deleted

## 2016-03-29 ENCOUNTER — Other Ambulatory Visit: Payer: Self-pay | Admitting: Cardiovascular Disease

## 2016-03-29 DIAGNOSIS — Z7901 Long term (current) use of anticoagulants: Secondary | ICD-10-CM

## 2016-03-29 DIAGNOSIS — I824Y3 Acute embolism and thrombosis of unspecified deep veins of proximal lower extremity, bilateral: Secondary | ICD-10-CM | POA: Diagnosis not present

## 2016-03-29 DIAGNOSIS — R600 Localized edema: Secondary | ICD-10-CM | POA: Diagnosis not present

## 2016-03-29 DIAGNOSIS — Z86711 Personal history of pulmonary embolism: Secondary | ICD-10-CM | POA: Diagnosis not present

## 2016-03-29 LAB — POCT INR: INR: 1.5

## 2016-04-09 ENCOUNTER — Ambulatory Visit: Payer: Self-pay

## 2016-04-10 ENCOUNTER — Other Ambulatory Visit: Payer: Self-pay

## 2016-04-10 NOTE — Patient Outreach (Addendum)
[facility] [facility]) Care Management  04/10/2016  Nicolas [name] 02-14-1966 [id number]  Telephonic Monthly Assessment   Subjective:  Patient states no new MD appts over the past month.  States issue with cost of diclofenac gel resolved with [place] assistance and much appreciated.    Providers:  Primary MD: [name] last appt 02/2016 and next appt 08/2016 HH: None DME Provider: [place]: [employer] Medicare, Medicare "Extra Help" BENEFITS Question:  Patient would like to know if he has any benefits for chiropractor services.   Social: Patient lives in his home with his 59 -22 year old son.   Mobility: Patient with limited mobility due to H/O past 7-8 back surgeries since 2005.  Falls: 1 (12/2015) - no injury.  Caregiver: 65 year old son, brother when visiting.  Transportation: W/C and city bus Resources: [employer]. H/o previously applied for Medicaid twice and did not meet eligibility approximately 2 years ago.  DME: walker, electric W/C, CBG meter (2013) (not required to test), hospital bed, mattress (replaced 01/2016).  No scales or BP cuff in the home.   HTN Elevated BP 138/85 on last office visit 02/28/16. Patient estimates weight as 330. Patient does not have scales or BP cuff in the home but confirms he thinks he could weight himself while supporting his self on a walker.  DM 2 - (Diet managed only and not required to test BS) ER/Admissions: 0 A1C 5.9 on 08/30/15 H/o weight has been as low as 295 but currently estimates his weight as 330.  (H/o FNP confirmed 10/25/15 patient is only required to manage his diabetes with diet only. No medications or blood sugar testing required). Patient agreed he would benefit from Diabetes Diet education. Mail returned. (RN CM verified address and advised will mail out a 2nd time.  Mail returned again on 04/09/2016; RN CM verified address and will mail out a 3rd time.    Medications:   Patient taking less than 10 medications  Co-pay cost issues: Diclofenac tablets changed to topical due to blood thinning side effects and patient on Coumadin. Patient states tube of diclofenac gel cost $60.00 resolved with [place] assistance.  Outcome: patient's prescription was being filled for diclofenac gel which is not on [place]. [employer] mail order pharmacy filled prescription for brand name Voltaren gel (tier 4) which reduced co-pay to $3.30 Flu Vaccine: 08/30/15 Pneumonia Vaccine: PPSV2 11/26/2008 tDAP 06/18/2014 Medication Reconciliation completed 04/10/2016  Objective:   Encounter Medications:  Outpatient Encounter Prescriptions as of 04/10/2016  Medication Sig Note  . calamine lotion  03/01/2016: Received from: External Pharmacy  . Calcium Carbonate Antacid 600 MG chewable tablet  03/02/2016: Takes as needed  . Camphor-Eucalyptus-Menthol (MEDICATED CHEST RUB) 4.73-1.2-2.6 % OINT  03/01/2016: Received from: External Pharmacy  . diclofenac sodium (VOLTAREN) 1 % GEL Reported on 03/02/2016 03/01/2016: Received from: External Pharmacy  . furosemide (LASIX) 40 MG tablet Take 1 tablet (40 mg total) by mouth daily. 11/09/2015: Reports takes as needed  . Loratadine 10 MG CAPS Reported on 03/02/2016 03/01/2016: Received from: External Pharmacy  . metoprolol succinate (TOPROL-XL) 50 MG 24 hr tablet Take 1 tablet (50 mg total) by mouth every evening. Take with or immediately following a meal.   . Multiple Vitamin (MULTIVITAMIN WITH MINERALS) TABS tablet Take 1 tablet by mouth daily. Reported on 11/09/2015   . potassium chloride SA (K-DUR,KLOR-CON) 20 MEQ tablet Take 1 tablet (20 mEq total) by mouth daily. 11/09/2015: Takes as needed with furosemide.  Marland Kitchen  sertraline (ZOLOFT) 100 MG tablet Take 100 mg by mouth daily. Takes 1.5 tablets at bedtime.   Marland Kitchen warfarin (COUMADIN) 10 MG tablet TAKE AS DIRECTED BY COUMADIN CLINIC    No facility-administered encounter medications on file as of  04/10/2016.    Functional Status:  In your present state of health, do you have any difficulty performing the following activities: 02/13/2016 11/09/2015  Hearing? N N  Vision? N N  Difficulty concentrating or making decisions? N N  Walking or climbing stairs? Y Y  Dressing or bathing? N -  Doing errands, shopping? Y N  Preparing Food and eating ? N Y  Using the Toilet? N (No Data)  In the past six months, have you accidently leaked urine? N Y  Do you have problems with loss of bowel control? N Y  Managing your Medications? N N  Managing your Finances? N N  Housekeeping or managing your Housekeeping? [name]    Fall/Depression Screening: PHQ 2/9 Scores 04/10/2016 03/12/2016 02/28/2016 02/13/2016 01/30/2016 11/16/2015 11/09/2015  PHQ - 2 Score 0 0 0 0 0 1 2   Fall Risk  04/10/2016 03/12/2016 02/28/2016 02/13/2016 01/30/2016  Falls in the past year? Yes Yes Yes Yes Yes  Number falls in past yr: 1 2 or more 2 or more 2 or more 2 or more  Injury with Fall? No No No No No  Risk Factor Category  - High Fall Risk High Fall Risk High Fall Risk High Fall Risk  Risk for fall due to : Impaired balance/gait;Impaired mobility History of fall(s);Impaired balance/gait;Impaired mobility - History of fall(s);Impaired balance/gait;Impaired mobility History of fall(s);Impaired balance/gait;Impaired mobility  Follow up Falls prevention discussed Falls evaluation completed;Falls prevention discussed;Education provided - Falls evaluation completed;Education provided;Falls prevention discussed Falls evaluation completed;Education provided;Falls prevention discussed    Assessment:  Patient requires only diet management for DM 2 diagnosis but high risk due to obesity.  DM education and preventive management review needed once educational mailer received by patient. .   Plan:  Referral Date: 01/18/2016 Screening 01/30/2016 Initial Assessment: 02/13/2016 Telephonic RN CM 01/30/2016 Program:  -DM 01/30/2016 -  03/12/16 -HTN 03/12/16  HTN -No scales or BP cuff in the home.  RN CM will search for benefit for scales (up to 350/400)  and BP cuff (extra large)   DM2 RN CM will follow-up again next call for further discussion regarding; (patient to provide update on next contact call).  Emmi education mailed 02/13/16, 03/12/16 and again 04/10/16.  -Diabetes type 2 - meal planning -Counting carbohydrates -Low-salt diet  Provider Benefits:  RN CM will search [employer] coverage for BP cuff, scales and chiropractor service benefit options.   Fall Risk Fall prevention and safety reviewed.   Advance Directive RN CM mailed copy of Advance Directive 02/13/2016, 03/12/16 and again 04/10/16. -Review next contact call.   RN CM advised in next scheduled contact call within the next 30 days and care coordination services as needed.  RN CM advised to please notify MD of any changes in condition prior to scheduled appt's.  RN CM provided contact name and # [phone] or main office # [phone] and 24-hour nurse line # [phone].  RN CM confirmed patient is aware of 911 services for urgent emergency needs.  Mariann Laster, RN, BSN, [facility], CCM  [employer] Management Coordinator [phone] Direct [phone] Cell [phone] Office [phone] Fax

## 2016-04-17 ENCOUNTER — Other Ambulatory Visit: Payer: Self-pay | Admitting: Family Medicine

## 2016-04-18 ENCOUNTER — Other Ambulatory Visit: Payer: Self-pay

## 2016-04-18 MED ORDER — DICLOFENAC SODIUM 1 % TD GEL: 2.0000 g | Freq: Four times a day (QID) | TRANSDERMAL | Status: DC

## 2016-04-30 ENCOUNTER — Ambulatory Visit (INDEPENDENT_AMBULATORY_CARE_PROVIDER_SITE_OTHER): Payer: Commercial Managed Care - HMO | Admitting: *Deleted

## 2016-04-30 DIAGNOSIS — I824Y3 Acute embolism and thrombosis of unspecified deep veins of proximal lower extremity, bilateral: Secondary | ICD-10-CM

## 2016-04-30 DIAGNOSIS — Z7901 Long term (current) use of anticoagulants: Secondary | ICD-10-CM | POA: Diagnosis not present

## 2016-04-30 DIAGNOSIS — R600 Localized edema: Secondary | ICD-10-CM

## 2016-04-30 DIAGNOSIS — Z86711 Personal history of pulmonary embolism: Secondary | ICD-10-CM | POA: Diagnosis not present

## 2016-04-30 LAB — POCT INR: INR: 2.9

## 2016-05-08 ENCOUNTER — Ambulatory Visit: Payer: Self-pay

## 2016-05-09 ENCOUNTER — Other Ambulatory Visit: Payer: Self-pay

## 2016-05-09 NOTE — Patient Outreach (Signed)
[name]) Care Management  05/09/2016  Nicolas [name] 1966/01/22 [id number]  Telephonic Monthly Assessment  Program:  DM  Subjective:  States doing well and no appointments since last contact with RN CM.   Providers:  Primary MD: [name] last appt 08/30/2015./ Next appt 02/2016 HH: None Insurance: [employer]  Social: Patient lives in his home with his 76 49 year old son.   Mobility: Patient with limited mobility due to H/O past 7-8 back surgeries since 2005.  Falls: 1 (12/2015) - no injury.  Pain: none Caregiver: 49 year old son, brother when visiting.  Transportation: W/C and city bus Resources:  -[employer].  -H/o previously applied for Medicaid twice and did not meet eligibility approximately 2 years ago.  DME: walker, electric W/C, CBG meter, hospital bed, mattress replaced 01/2016. DME Provider: [name] Benefits:  (Patient entitled to obtain scales and [facility] [place].   Patient interested in chiropractic services; no benefit found with current policy.  Advised to contact [place] for confirmation.   THN conditions: DM 2 ER/Admissions: 0 A1C 5.9 on 08/30/15 No diabetic medications.  H/o FNP confirmed 10/25/15 patient is only required to manage his diabetes with diet only. No medications or blood sugar testing required.  Weight:  300- 325; would like to get back to 295.  THN has provided DM 2 education per patients request.   Medications:  Patient taking less than 10 medications  Co-pay cost issues: YES. Diclofenac tablets changed to topical due to blood thinning side effects and patient on Coumadin. Patient states tube of diclofenac gel cost $60.00 and not affordable.  Outcome:  [facility] Pharmacist completed three-way phone call to [facility] mail order pharmacy and identified that patient's prescription was being filled for diclofenac gel which is not on [place]. [employer] mail order pharmacy filled prescription for brand name Voltaren gel (tier 4) which reduced co-pay to $3.30. Issue:  Resolved 03/19/16. Flu Vaccine: 08/30/15 Pneumonia Vaccine: PPSV2 11/26/2008 tDAP 06/18/2014   Objective:   Encounter Medications:  Outpatient Encounter Prescriptions as of 05/09/2016  Medication Sig Note  . calamine lotion  03/01/2016: Received from: External Pharmacy  . Calcium Carbonate Antacid 600 MG chewable tablet  03/02/2016: Takes as needed  . Camphor-Eucalyptus-Menthol (MEDICATED CHEST RUB) 4.73-1.2-2.6 % OINT  03/01/2016: Received from: External Pharmacy  . diclofenac sodium (VOLTAREN) 1 % GEL Apply 2 g topically 4 (four) times daily.   . furosemide (LASIX) 40 MG tablet Take 1 tablet (40 mg total) by mouth daily. 11/09/2015: Reports takes as needed  . Loratadine 10 MG CAPS Reported on 03/02/2016 03/01/2016: Received from: External Pharmacy  . metoprolol succinate (TOPROL-XL) 50 MG 24 hr tablet Take 1 tablet (50 mg total) by mouth every evening. Take with or immediately following a meal.   . Multiple Vitamin (MULTIVITAMIN WITH MINERALS) TABS tablet Take 1 tablet by mouth daily. Reported on 11/09/2015   . potassium chloride SA (K-DUR,KLOR-CON) 20 MEQ tablet Take 1 tablet (20 mEq total) by mouth daily. 11/09/2015: Takes as needed with furosemide.  . sertraline (ZOLOFT) 100 MG tablet Take 100 mg by mouth daily. Takes 1.5 tablets at bedtime.   Marland Kitchen warfarin (COUMADIN) 10 MG tablet TAKE AS DIRECTED BY COUMADIN CLINIC    No facility-administered encounter medications on file as of 05/09/2016.    Functional Status:  In your present state of health, do you have any difficulty performing the following activities: 02/13/2016 11/09/2015  Hearing? N N  Vision?  N N  Difficulty concentrating or making decisions? N N  Walking or climbing stairs? Y Y  Dressing or bathing? N -  Doing errands, shopping? Y N  Preparing Food and eating ? N Y  Using the Toilet? N (No Data)  In  the past six months, have you accidently leaked urine? N Y  Do you have problems with loss of bowel control? N Y  Managing your Medications? N N  Managing your Finances? N N  Housekeeping or managing your Housekeeping? [name]    Fall/Depression Screening: PHQ 2/9 Scores 05/09/2016 04/10/2016 03/12/2016 02/28/2016 02/13/2016 01/30/2016 11/16/2015  PHQ - 2 Score 0 0 0 0 0 0 1    Fall Risk  05/09/2016 04/10/2016 03/12/2016 02/28/2016 02/13/2016  Falls in the past year? Yes Yes Yes Yes Yes  Number falls in past yr: 2 or more 1 2 or more 2 or more 2 or more  Injury with Fall? No No No No No  Risk Factor Category  High Fall Risk - High Fall Risk High Fall Risk High Fall Risk  Risk for fall due to : History of fall(s);Impaired balance/gait;Impaired mobility Impaired balance/gait;Impaired mobility History of fall(s);Impaired balance/gait;Impaired mobility - History of fall(s);Impaired balance/gait;Impaired mobility  Follow up Falls prevention discussed;Falls evaluation completed;Education provided Falls prevention discussed Falls evaluation completed;Falls prevention discussed;Education provided - Falls evaluation completed;Education provided;Falls prevention discussed    Assessment:  Patient requires only diet management for DM 2 diagnosis but high risk due to obesity.  Patient needs scales, BP cuff for improve home self-management program.   Plan:  Referral Date: 01/18/2016 Screening 01/30/2016 Telephonic RN CM 01/30/2016 Program: DM 01/30/2016 Initial Assessment: 02/13/2016  DM2 RN CM will follow-up again next call for further discussion regarding; (patient to provide update on next contact call).  -CBG meter age and name -last and next appt date: Diabetic eye exam, Diabetic foot exam and Dentist.  Patient not at home and states he will provide next call.   RN CM provided Emmi Education (Reviewed 05/09/16) -Diabetes type 2 - meal planning -Counting carbohydrates -Low-salt diet  Benefits:   RN CM  advised:  (Patient entitled to obtain scales and BP cuff through [employer] with [place].   Patient interested in chiropractic services; no benefit found with current policy.  Advised to contact [place] for confirmation.  RN CM advised patient will attempt to follow-up on cost to determine if patient can obtain through [facility].   Advance Directive RN CM mailed copy of Advance Directive 02/13/2016 Remains incomplete.   RN CM advised in next scheduled contact call within the next 30 days.  RN CM advised to please notify MD of any changes in condition prior to scheduled appt's.  RN CM provided contact name and # [phone] or main office # [phone] and 24-hour nurse line # [phone].  RN CM confirmed patient is aware of 911 services for urgent emergency needs.  Mariann Laster, RN, BSN, [facility], [place] Care Management Care Management Coordinator [phone] Direct [phone] Cell [phone] Office [phone] Fax [email or web address]

## 2016-05-22 ENCOUNTER — Telehealth: Payer: Self-pay

## 2016-05-22 ENCOUNTER — Other Ambulatory Visit: Payer: Self-pay | Admitting: Family Medicine

## 2016-05-22 ENCOUNTER — Other Ambulatory Visit: Payer: Self-pay

## 2016-05-22 DIAGNOSIS — M4716 Other spondylosis with myelopathy, lumbar region: Secondary | ICD-10-CM

## 2016-05-22 DIAGNOSIS — Z789 Other specified health status: Secondary | ICD-10-CM

## 2016-05-22 DIAGNOSIS — M4804 Spinal stenosis, thoracic region: Secondary | ICD-10-CM

## 2016-05-22 DIAGNOSIS — M48061 Spinal stenosis, lumbar region without neurogenic claudication: Secondary | ICD-10-CM

## 2016-05-22 DIAGNOSIS — M4714 Other spondylosis with myelopathy, thoracic region: Secondary | ICD-10-CM

## 2016-05-22 DIAGNOSIS — Z7409 Other reduced mobility: Secondary | ICD-10-CM

## 2016-05-22 DIAGNOSIS — R29898 Other symptoms and signs involving the musculoskeletal system: Secondary | ICD-10-CM

## 2016-05-22 NOTE — Patient Outreach (Signed)
[facility]) Care Management  05/22/2016  Nicolas [name] Oct 26, 1966 [id number]   Telephonic Care Coordination Services  DME:  Scales and BP cuff  RN CM advised: (Patient entitled to obtain scales and BP cuff through [employer] with [place].  Patient confirms he does not have an Engineer, agricultural.  Advised to contact [place] # on insurance card and request catalog to be mailed to patient.  RN CM advised patient once catalog received; RN CM will provide additional guidance as needed.   OP PT Patient states he would like to go for OP PT services.  States he can go to the [facility] but states he needs supervision and direction on what he can safely do and should be doing.   RN CM advised will contact Dr. Carlean Purl to notify of request.    RN CM confirmed last office appt was 02/28/2016.   Mariann Laster, RN, BSN, [facility], [place] Management Care Management Coordinator [phone] Direct [phone] Cell [phone] Office [phone] Fax [email or web address]

## 2016-05-22 NOTE — Telephone Encounter (Signed)
[name] called and states the [facility] He wants to have direction with a professional first. He needs a referral possibly to the [facility] Outpatient center. Please advise.   [id number] [place]

## 2016-05-22 NOTE — Patient Outreach (Signed)
[facility]) Care Management  05/22/2016  THEODUS MCMURDIE 14-Oct-1966 [id number]   Telephonic Care Coordination Services  Contact call to Primary MD:  Neoma Laming. Carlean Purl, NP H/o Patient states he would like to go for OP PT services. States he can go to the [facility] but states he needs supervision and direction on what he can safely do and should be doing.  RN CM contacted Carlean Purl, NP and  notified of request for OP PT services; h/o last office appt was 02/28/2016.  RN CM requested call back with update of order and provider contacted for service delivery.   Mariann Laster, RN, BSN, [facility], [place] Management Care Management Coordinator [phone] Direct [phone] Cell [phone] Office [phone] Fax [email or web address]

## 2016-05-22 NOTE — Telephone Encounter (Signed)
[facility] Can you please let [name] know.

## 2016-05-23 ENCOUNTER — Other Ambulatory Visit: Payer: Self-pay

## 2016-05-23 NOTE — Patient Outreach (Signed)
[facility]) Care Management  05/23/2016  Nicolas [name] 02/20/1966 [id number]   Inbound call received from Primary Care office verifying MD has called order for OP PT services to [place] services, [address] Location.   Outreach call #1 to patient to provide update:  Not reached.  RN CM left HIPAA compliant voice message with name and number for call back.   Mariann Laster, RN, BSN, [facility], [place] Management Care Management Coordinator [phone] Direct [phone] Cell [phone] Office [phone] Fax [email or web address]

## 2016-05-23 NOTE — Telephone Encounter (Signed)
[name] advised 

## 2016-05-28 ENCOUNTER — Ambulatory Visit (INDEPENDENT_AMBULATORY_CARE_PROVIDER_SITE_OTHER): Payer: Commercial Managed Care - HMO | Admitting: *Deleted

## 2016-05-28 ENCOUNTER — Ambulatory Visit: Payer: Commercial Managed Care - HMO | Admitting: Physical Therapy

## 2016-05-28 DIAGNOSIS — Z86711 Personal history of pulmonary embolism: Secondary | ICD-10-CM | POA: Diagnosis not present

## 2016-05-28 DIAGNOSIS — Z7901 Long term (current) use of anticoagulants: Secondary | ICD-10-CM

## 2016-05-28 DIAGNOSIS — R600 Localized edema: Secondary | ICD-10-CM | POA: Diagnosis not present

## 2016-05-28 DIAGNOSIS — I824Y3 Acute embolism and thrombosis of unspecified deep veins of proximal lower extremity, bilateral: Secondary | ICD-10-CM | POA: Diagnosis not present

## 2016-05-28 LAB — POCT INR: INR: 2.1

## 2016-06-06 ENCOUNTER — Ambulatory Visit: Payer: Self-pay

## 2016-06-08 DIAGNOSIS — R29898 Other symptoms and signs involving the musculoskeletal system: Secondary | ICD-10-CM | POA: Diagnosis not present

## 2016-06-08 DIAGNOSIS — M4804 Spinal stenosis, thoracic region: Secondary | ICD-10-CM | POA: Diagnosis not present

## 2016-06-08 DIAGNOSIS — M4806 Spinal stenosis, lumbar region: Secondary | ICD-10-CM | POA: Diagnosis not present

## 2016-06-08 DIAGNOSIS — M4716 Other spondylosis with myelopathy, lumbar region: Secondary | ICD-10-CM | POA: Diagnosis not present

## 2016-06-08 DIAGNOSIS — M4714 Other spondylosis with myelopathy, thoracic region: Secondary | ICD-10-CM | POA: Diagnosis not present

## 2016-06-13 DIAGNOSIS — R29898 Other symptoms and signs involving the musculoskeletal system: Secondary | ICD-10-CM | POA: Diagnosis not present

## 2016-06-13 DIAGNOSIS — M4716 Other spondylosis with myelopathy, lumbar region: Secondary | ICD-10-CM | POA: Diagnosis not present

## 2016-06-13 DIAGNOSIS — M4804 Spinal stenosis, thoracic region: Secondary | ICD-10-CM | POA: Diagnosis not present

## 2016-06-13 DIAGNOSIS — M4806 Spinal stenosis, lumbar region: Secondary | ICD-10-CM | POA: Diagnosis not present

## 2016-06-13 DIAGNOSIS — M4714 Other spondylosis with myelopathy, thoracic region: Secondary | ICD-10-CM | POA: Diagnosis not present

## 2016-06-15 ENCOUNTER — Other Ambulatory Visit: Payer: Self-pay

## 2016-06-15 DIAGNOSIS — M4714 Other spondylosis with myelopathy, thoracic region: Secondary | ICD-10-CM | POA: Diagnosis not present

## 2016-06-15 DIAGNOSIS — M4804 Spinal stenosis, thoracic region: Secondary | ICD-10-CM | POA: Diagnosis not present

## 2016-06-15 DIAGNOSIS — R29898 Other symptoms and signs involving the musculoskeletal system: Secondary | ICD-10-CM | POA: Diagnosis not present

## 2016-06-15 DIAGNOSIS — M4716 Other spondylosis with myelopathy, lumbar region: Secondary | ICD-10-CM | POA: Diagnosis not present

## 2016-06-15 DIAGNOSIS — M4806 Spinal stenosis, lumbar region: Secondary | ICD-10-CM | POA: Diagnosis not present

## 2016-06-15 NOTE — Patient Outreach (Addendum)
[facility]) Care Management  06/15/2016  DONN ANGSTADT 05/30/1966 [id number]   Telephonic Monthly Assessment  Program:  -DM 01/30/2016 - 03/12/16 -HTN 03/12/16 Insurance: [employer] Medicare, Medicare "Extra Help"  Subjective:  Patient states financial difficulties with OP Rehab co-pays $10.00 for 2 time per week sessions.   Providers:  Primary MD: [name] last appt 02/2016 and next appt 08/2016 OP Rehab:  Active with [facility] HH: None DME Provider: [name]  Social: Patient lives in his home with his 16 -49 year old son.   Mobility: Patient with limited mobility due to H/O past 7-8 back surgeries since 2005.  Falls: 1 (12/2015) - no injury.  Caregiver: 18 year old son, brother when visiting.  Transportation: W/C and city bus Resources: [employer]. H/o previously applied for Medicaid twice and did not meet eligibility approximately 2 years ago.  DME: walker, electric W/C, CBG meter (2013) (not required to test), hospital bed, mattress (replaced 01/2016).  No scales or BP cuff in the home.   HTN Elevated BP 138/85 on last office visit 02/28/16. Patient estimates weight as 330. Patient does not have scales or BP cuff in the home but confirms he thinks he could weight himself while supporting his self on a walker.  Patient has been provided information on [employer] Benefit of option to obtain supplies through the [employer].  Patient confirms he has gone on line and reviewed and planning to order these items.    DM 2 - (Diet managed only and not required to test BS) ER/Admissions: 0 A1C 5.9 on 08/30/15 H/o weight has been as low as 295 but currently estimates his weight as 330.  (H/o FNP confirmed 10/25/15 patient is only required to manage his diabetes with diet only. No medications or blood sugar testing required). Patient agreed he would benefit from Diabetes Diet education. Mail returned. (RN CM verified address and advised  will mail out a 2nd time. Mail returned again on 04/09/2016; RN CM verified address and will mail out a 3rd time.  Emmi education mailed 02/13/16, 03/12/16 and again 04/10/16.  -Diabetes type 2 - meal planning -Counting carbohydrates -Low-salt diet   Medications:  Patient taking less than 10 medications  Co-pay cost issues: none Flu Vaccine: 08/30/15 Pneumonia Vaccine: PPSV2 11/26/2008 tDAP 06/18/2014 Medication Reconciliation completed 06/15/16  Objective:   Encounter Medications:  Outpatient Encounter Prescriptions as of 06/15/2016  Medication Sig Note  . calamine lotion  03/01/2016: Received from: External Pharmacy  . Calcium Carbonate Antacid 600 MG chewable tablet  03/02/2016: Takes as needed  . Camphor-Eucalyptus-Menthol (MEDICATED CHEST RUB) 4.73-1.2-2.6 % OINT  03/01/2016: Received from: External Pharmacy  . diclofenac sodium (VOLTAREN) 1 % GEL Apply 2 g topically 4 (four) times daily.   . furosemide (LASIX) 40 MG tablet Take 1 tablet (40 mg total) by mouth daily. 11/09/2015: Reports takes as needed  . Loratadine 10 MG CAPS Reported on 03/02/2016 03/01/2016: Received from: External Pharmacy  . metoprolol succinate (TOPROL-XL) 50 MG 24 hr tablet Take 1 tablet (50 mg total) by mouth every evening. Take with or immediately following a meal.   . Multiple Vitamin (MULTIVITAMIN WITH MINERALS) TABS tablet Take 1 tablet by mouth daily. Reported on 11/09/2015   . potassium chloride SA (K-DUR,KLOR-CON) 20 MEQ tablet Take 1 tablet (20 mEq total) by mouth daily. 11/09/2015: Takes as needed with furosemide.  . sertraline (ZOLOFT) 100 MG tablet Take 100 mg by mouth daily. Takes 1.5 tablets at bedtime.   Marland Kitchen warfarin (  COUMADIN) 10 MG tablet TAKE AS DIRECTED BY COUMADIN CLINIC    No facility-administered encounter medications on file as of 06/15/2016.    Functional Status:  In your present state of health, do you have any difficulty performing the following activities: 02/13/2016 11/09/2015  Hearing? N N   Vision? N N  Difficulty concentrating or making decisions? N N  Walking or climbing stairs? Y Y  Dressing or bathing? N -  Doing errands, shopping? Y N  Preparing Food and eating ? N Y  Using the Toilet? N (No Data)  In the past six months, have you accidently leaked urine? N Y  Do you have problems with loss of bowel control? N Y  Managing your Medications? N N  Managing your Finances? N N  Housekeeping or managing your Housekeeping? [name]    Fall/Depression Screening: PHQ 2/9 Scores 06/15/2016 05/09/2016 04/10/2016 03/12/2016 02/28/2016 02/13/2016 01/30/2016  PHQ - 2 Score 0 0 0 0 0 0 0   Fall Risk  06/15/2016 05/09/2016 04/10/2016 03/12/2016 02/28/2016  Falls in the past year? Yes Yes Yes Yes Yes  Number falls in past yr: 2 or more 2 or more 1 2 or more 2 or more  Injury with Fall? No No No No No  Risk Factor Category  High Fall Risk High Fall Risk - High Fall Risk High Fall Risk  Risk for fall due to : History of fall(s);Impaired balance/gait;Impaired mobility History of fall(s);Impaired balance/gait;Impaired mobility Impaired balance/gait;Impaired mobility History of fall(s);Impaired balance/gait;Impaired mobility -  Follow up Falls prevention discussed;Falls evaluation completed;Education provided Falls prevention discussed;Falls evaluation completed;Education provided Falls prevention discussed Falls evaluation completed;Falls prevention discussed;Education provided -      Assessment:  Patient requires only diet management for DM 2 diagnosis but high risk due to obesity.  DM education and preventive management review needed once educational mailer received by patient. .   Plan:  Referral Date: 01/18/2016 Screening 01/30/2016 Initial Assessment: 02/13/2016 Telephonic RN CM 01/30/2016 Program:  -DM 01/30/2016 - 03/12/16 -HTN 03/12/16  DM / HTN RN CM reviewed with patient importance of scales (up to 350/400)and BP cuff (extra large). RN CM will continue to follow progress on obtaining  BP cuff and scales.   Emmi Education Materials (mailed 06/15/16) -High Blood Pressure (Hypertension):  Taking Your Blood Pressure -High Blood Pressure (Hypertension) What You Can Do -Low-Salt Diet  -Metabolic Syndrome:  Am I At Risk? -Counting Carbohydrates -Form:  Weight and BP Tracker  -Book: Carb counting and meal planning   Fall Risk / OP Rehab / Co-pays Fall prevention and safety reviewed.  RN CM encouraged to continue with OP rehab services; suggested speaking with therapist to discuss if sessions can be reduced to once per week to extend sessions and assist with financial burden.  Encouraged use of food pantry to free up money during this time.  Encouraged Medicaid application to review for eligibility again.  Advised SW services available to assist with this need if patient needs further assistance.   Advance Directive RN CM mailed copy of Advance Directive 02/13/2016, 03/12/16 and again 04/10/16. Patient has not reviewed.  RN CM encouraged to read and prepare for discussion on next contact call.   RN CM advised in next scheduled contact call within the next 30 days and care coordination services as needed.  RN CM advised to please notify MD of any changes in condition prior to scheduled appt's.  RN CM provided contact name and # [phone] or main office # [phone] and  24-hour nurse line # [phone].  RN CM confirmed patient is aware of 911 services for urgent emergency needs.  [name], BSN, RN, [place] Care Management Care Management Coordinator [phone] Direct [phone] Cell [phone] Office [phone] Fax [email or web address]

## 2016-06-20 DIAGNOSIS — M4804 Spinal stenosis, thoracic region: Secondary | ICD-10-CM | POA: Diagnosis not present

## 2016-06-20 DIAGNOSIS — M4716 Other spondylosis with myelopathy, lumbar region: Secondary | ICD-10-CM | POA: Diagnosis not present

## 2016-06-20 DIAGNOSIS — M4714 Other spondylosis with myelopathy, thoracic region: Secondary | ICD-10-CM | POA: Diagnosis not present

## 2016-06-20 DIAGNOSIS — M4806 Spinal stenosis, lumbar region: Secondary | ICD-10-CM | POA: Diagnosis not present

## 2016-06-20 DIAGNOSIS — R29898 Other symptoms and signs involving the musculoskeletal system: Secondary | ICD-10-CM | POA: Diagnosis not present

## 2016-06-22 DIAGNOSIS — M4716 Other spondylosis with myelopathy, lumbar region: Secondary | ICD-10-CM | POA: Diagnosis not present

## 2016-06-22 DIAGNOSIS — M4804 Spinal stenosis, thoracic region: Secondary | ICD-10-CM | POA: Diagnosis not present

## 2016-06-22 DIAGNOSIS — M4806 Spinal stenosis, lumbar region: Secondary | ICD-10-CM | POA: Diagnosis not present

## 2016-06-22 DIAGNOSIS — R29898 Other symptoms and signs involving the musculoskeletal system: Secondary | ICD-10-CM | POA: Diagnosis not present

## 2016-06-22 DIAGNOSIS — M4714 Other spondylosis with myelopathy, thoracic region: Secondary | ICD-10-CM | POA: Diagnosis not present

## 2016-06-27 DIAGNOSIS — M4806 Spinal stenosis, lumbar region: Secondary | ICD-10-CM | POA: Diagnosis not present

## 2016-06-27 DIAGNOSIS — M4804 Spinal stenosis, thoracic region: Secondary | ICD-10-CM | POA: Diagnosis not present

## 2016-06-27 DIAGNOSIS — M4714 Other spondylosis with myelopathy, thoracic region: Secondary | ICD-10-CM | POA: Diagnosis not present

## 2016-06-27 DIAGNOSIS — M4716 Other spondylosis with myelopathy, lumbar region: Secondary | ICD-10-CM | POA: Diagnosis not present

## 2016-06-27 DIAGNOSIS — R29898 Other symptoms and signs involving the musculoskeletal system: Secondary | ICD-10-CM | POA: Diagnosis not present

## 2016-06-29 ENCOUNTER — Ambulatory Visit (INDEPENDENT_AMBULATORY_CARE_PROVIDER_SITE_OTHER): Payer: Commercial Managed Care - HMO

## 2016-06-29 DIAGNOSIS — M4716 Other spondylosis with myelopathy, lumbar region: Secondary | ICD-10-CM | POA: Diagnosis not present

## 2016-06-29 DIAGNOSIS — Z86711 Personal history of pulmonary embolism: Secondary | ICD-10-CM

## 2016-06-29 DIAGNOSIS — R600 Localized edema: Secondary | ICD-10-CM

## 2016-06-29 DIAGNOSIS — M4804 Spinal stenosis, thoracic region: Secondary | ICD-10-CM | POA: Diagnosis not present

## 2016-06-29 DIAGNOSIS — Z7901 Long term (current) use of anticoagulants: Secondary | ICD-10-CM

## 2016-06-29 DIAGNOSIS — I824Y3 Acute embolism and thrombosis of unspecified deep veins of proximal lower extremity, bilateral: Secondary | ICD-10-CM | POA: Diagnosis not present

## 2016-06-29 DIAGNOSIS — M4714 Other spondylosis with myelopathy, thoracic region: Secondary | ICD-10-CM | POA: Diagnosis not present

## 2016-06-29 DIAGNOSIS — R29898 Other symptoms and signs involving the musculoskeletal system: Secondary | ICD-10-CM | POA: Diagnosis not present

## 2016-06-29 DIAGNOSIS — M4806 Spinal stenosis, lumbar region: Secondary | ICD-10-CM | POA: Diagnosis not present

## 2016-06-29 LAB — POCT INR: INR: 2.3

## 2016-07-04 DIAGNOSIS — R29898 Other symptoms and signs involving the musculoskeletal system: Secondary | ICD-10-CM | POA: Diagnosis not present

## 2016-07-04 DIAGNOSIS — M4806 Spinal stenosis, lumbar region: Secondary | ICD-10-CM | POA: Diagnosis not present

## 2016-07-04 DIAGNOSIS — M4716 Other spondylosis with myelopathy, lumbar region: Secondary | ICD-10-CM | POA: Diagnosis not present

## 2016-07-04 DIAGNOSIS — M4714 Other spondylosis with myelopathy, thoracic region: Secondary | ICD-10-CM | POA: Diagnosis not present

## 2016-07-04 DIAGNOSIS — M4804 Spinal stenosis, thoracic region: Secondary | ICD-10-CM | POA: Diagnosis not present

## 2016-07-06 DIAGNOSIS — R29898 Other symptoms and signs involving the musculoskeletal system: Secondary | ICD-10-CM | POA: Diagnosis not present

## 2016-07-06 DIAGNOSIS — M4804 Spinal stenosis, thoracic region: Secondary | ICD-10-CM | POA: Diagnosis not present

## 2016-07-06 DIAGNOSIS — M4716 Other spondylosis with myelopathy, lumbar region: Secondary | ICD-10-CM | POA: Diagnosis not present

## 2016-07-06 DIAGNOSIS — M4714 Other spondylosis with myelopathy, thoracic region: Secondary | ICD-10-CM | POA: Diagnosis not present

## 2016-07-06 DIAGNOSIS — M4806 Spinal stenosis, lumbar region: Secondary | ICD-10-CM | POA: Diagnosis not present

## 2016-07-13 ENCOUNTER — Ambulatory Visit: Payer: Self-pay

## 2016-07-17 ENCOUNTER — Other Ambulatory Visit: Payer: Self-pay

## 2016-07-17 NOTE — Patient Outreach (Signed)
[facility]) Care Management  07/17/2016  Nicolas [name] 1966-03-21 [id number]  Telephonic Monthly Assessment  Program:  -DM 01/30/2016 - 03/12/16 -HTN 03/12/16 Insurance: Humana Medicare, Medicare "Extra Help"  Subjective:  Patient states financial difficulties with OP Rehab co-pays $10.00 for 2 time per week sessions but continues to go 2 times a week.  States improvement noted.    Providers:  Primary MD: [name] last appt 02/2016 and next appt 08/2016 OP Rehab:  Active with [place] [facility]: None  Social: Patient lives in his home with his 44 -58 year old son.   Mobility: Patient with limited mobility due to H/O past 7-8 back surgeries since 2005.  Falls: 1 (12/2015) - no injury.  Caregiver: 63 year old son, brother when visiting.  Transportation: W/C and city bus Resources: [employer]. H/o previously applied for Medicaid twice and did not meet eligibility approximately 2 years ago.  DME: walker, electric W/C, CBG meter (2013) (not required to test), hospital bed, mattress (replaced 01/2016).  No scales or BP cuff in the home.  DME provider:  [name].   HTN Patient does not have scales or BP cuff in the home but confirms he thinks he could weight himself while supporting his self on a walker.  Patient has been provided information on [employer] Benefit of option to obtain supplies through the [employer] but event with benfit; patient is unable to afford.  Patient weighed at coumadin clinic :  270 lbs.    DM 2 - (Diet managed only and not required to test BS) ER/Admissions: 0 A1C 5.9 on 08/30/15  (H/o FNP confirmed 10/25/15 patient is only required to manage his diabetes with diet only. No medications or blood sugar testing required). Patient states he has almost stopped drinking colas and has noted his waist size getting smaller.    Medications:  Patient taking less than 10 medications  Co-pay cost issues: none Flu  Vaccine: 08/30/15 Pneumonia Vaccine: PPSV2 11/26/2008 tDAP 06/18/2014 Medication Reconciliation completed 07/18/2016   Objective:   Encounter Medications:  Outpatient Encounter Prescriptions as of 07/17/2016  Medication Sig Note  . calamine lotion  03/01/2016: Received from: External Pharmacy  . Calcium Carbonate Antacid 600 MG chewable tablet  03/02/2016: Takes as needed  . Camphor-Eucalyptus-Menthol (MEDICATED CHEST RUB) 4.73-1.2-2.6 % OINT  03/01/2016: Received from: External Pharmacy  . diclofenac sodium (VOLTAREN) 1 % GEL Apply 2 g topically 4 (four) times daily.   . furosemide (LASIX) 40 MG tablet Take 1 tablet (40 mg total) by mouth daily. 11/09/2015: Reports takes as needed  . Loratadine 10 MG CAPS Reported on 03/02/2016 03/01/2016: Received from: External Pharmacy  . metoprolol succinate (TOPROL-XL) 50 MG 24 hr tablet Take 1 tablet (50 mg total) by mouth every evening. Take with or immediately following a meal.   . Multiple Vitamin (MULTIVITAMIN WITH MINERALS) TABS tablet Take 1 tablet by mouth daily. Reported on 11/09/2015   . potassium chloride SA (K-DUR,KLOR-CON) 20 MEQ tablet Take 1 tablet (20 mEq total) by mouth daily. 11/09/2015: Takes as needed with furosemide.  . sertraline (ZOLOFT) 100 MG tablet Take 100 mg by mouth daily. Takes 1.5 tablets at bedtime.   Marland Kitchen warfarin (COUMADIN) 10 MG tablet TAKE AS DIRECTED BY COUMADIN CLINIC    No facility-administered encounter medications on file as of 07/17/2016.     Functional Status:  In your present state of health, do you have any difficulty performing the following activities: 02/13/2016 11/09/2015  Hearing? N N  Vision?  N N  Difficulty concentrating or making decisions? N N  Walking or climbing stairs? Y Y  Dressing or bathing? N -  Doing errands, shopping? Y N  Preparing Food and eating ? N Y  Using the Toilet? N (No Data)  In the past six months, have you accidently leaked urine? N Y  Do you have problems with loss of bowel control? N  Y  Managing your Medications? N N  Managing your Finances? N N  Housekeeping or managing your Housekeeping? [name]  Some recent data might be hidden    Fall/Depression Screening: PHQ 2/9 Scores 07/18/2016 06/15/2016 05/09/2016 04/10/2016 03/12/2016 02/28/2016 02/13/2016  PHQ - 2 Score 0 0 0 0 0 0 0    Fall Risk  07/18/2016 06/15/2016 05/09/2016 04/10/2016 03/12/2016  Falls in the past year? Yes Yes Yes Yes Yes  Number falls in past yr: 2 or more 2 or more 2 or more 1 2 or more  Injury with Fall? No No No No No  Risk Factor Category  High Fall Risk High Fall Risk High Fall Risk - High Fall Risk  Risk for fall due to : History of fall(s);Impaired balance/gait;Impaired mobility History of fall(s);Impaired balance/gait;Impaired mobility History of fall(s);Impaired balance/gait;Impaired mobility Impaired balance/gait;Impaired mobility History of fall(s);Impaired balance/gait;Impaired mobility  Follow up Falls evaluation completed;Education provided;Falls prevention discussed Falls prevention discussed;Falls evaluation completed;Education provided Falls prevention discussed;Falls evaluation completed;Education provided Falls prevention discussed Falls evaluation completed;Falls prevention discussed;Education provided    Assessment:  Patient requires only diet management for DM 2 diagnosis but high risk due to obesity.  Patient is motivated to improve his health.   Patient needs home scales and BP cuff.  Patient needs prescription order from MD to get from [facility].   Plan:  Referral Date: 01/18/2016 Screening 01/30/2016 Initial Assessment: 02/13/2016 Telephonic RN CM 01/30/2016 MD Quarterly update:  07/18/2016 Program:  -DM 01/30/2016 - 03/12/16 -HTN 03/12/16  DM / HTN RN CM reviewed with patient importance of scales (up to 350/400)and BP cuff (extra large). RN CM will continue to follow progress on obtaining BP cuff and scales.    [employer] Education Materials (mailed 06/15/16) (Reviewed  07/17/2016) -High Blood Pressure (Hypertension):  Taking Your Blood Pressure -High Blood Pressure (Hypertension) What You Can Do -Low-Salt Diet  -Metabolic Syndrome:  Am I At Risk? -Counting Carbohydrates -Form:  Weight and BP Tracker  -Book: Carb counting and meal planning    E[employer]i education mailed 02/13/16, 03/12/16 and again 04/10/16. (Reviewed 07/17/16) -Diabetes type 2 - meal planning -Counting carbohydrates -Low-salt diet   Fall Risk / OP Rehab / Co-pays Fall prevention and safety reviewed.  RN CM encouraged to continue with OP rehab services as long as patient able to afford co-pays.   Advance Directive RN CM mailed copy of Advance Directive 02/13/2016, 03/12/16 and again 04/10/16. Patient has not reviewed.  RN CM encouraged to read and prepare for discussion on next contact call.   RN CM advised in next scheduled contact call within the next 30 days and care coordination services as needed.  RN CM advised to please notify MD of any changes in condition prior to scheduled appt's.  RN CM provided contact name and # [phone] or main office # [phone] and 24-hour nurse line # [phone].  RN CM confirmed patient is aware of 911 services for urgent emergency needs.   [name], BSN, RN, CCM  [employer] [phone] Direct [phone] Cell [phone] Office [phone] Fax  [email or web address]

## 2016-08-03 ENCOUNTER — Other Ambulatory Visit: Payer: Self-pay

## 2016-08-03 NOTE — Patient Outreach (Signed)
[facility]) Care Management  08/03/2016  Nicolas [name] [birth year] [id number]  Telephonic Care Coordination Services  Issue:  DME:  BP cuff and scales Contact call to patient to confirm mailing address for delivery of needed items:  BP cuff and scales provided by [facility].   RN CM advised to watch for delivery to come.  Patient verbalized appreciation of assistance.    [name], BSN, RN, [place] Care Management Care Management Coordinator [phone] Direct [phone] Cell [phone] Office [phone] Fax [email or web address]

## 2016-08-09 DIAGNOSIS — R29898 Other symptoms and signs involving the musculoskeletal system: Secondary | ICD-10-CM | POA: Diagnosis not present

## 2016-08-09 DIAGNOSIS — M4804 Spinal stenosis, thoracic region: Secondary | ICD-10-CM | POA: Diagnosis not present

## 2016-08-09 DIAGNOSIS — M4716 Other spondylosis with myelopathy, lumbar region: Secondary | ICD-10-CM | POA: Diagnosis not present

## 2016-08-09 DIAGNOSIS — M4806 Spinal stenosis, lumbar region: Secondary | ICD-10-CM | POA: Diagnosis not present

## 2016-08-09 DIAGNOSIS — M4714 Other spondylosis with myelopathy, thoracic region: Secondary | ICD-10-CM | POA: Diagnosis not present

## 2016-08-10 ENCOUNTER — Ambulatory Visit: Payer: Self-pay

## 2016-08-10 ENCOUNTER — Ambulatory Visit (INDEPENDENT_AMBULATORY_CARE_PROVIDER_SITE_OTHER): Payer: Commercial Managed Care - HMO | Admitting: Pharmacist

## 2016-08-10 DIAGNOSIS — Z7901 Long term (current) use of anticoagulants: Secondary | ICD-10-CM

## 2016-08-10 DIAGNOSIS — Z86711 Personal history of pulmonary embolism: Secondary | ICD-10-CM | POA: Diagnosis not present

## 2016-08-10 LAB — POCT INR: INR: 2.3

## 2016-08-10 MED ORDER — WARFARIN SODIUM 10 MG PO TABS: ORAL_TABLET | ORAL | 0 refills | Status: DC

## 2016-08-13 ENCOUNTER — Ambulatory Visit: Payer: Self-pay

## 2016-08-14 ENCOUNTER — Other Ambulatory Visit: Payer: Self-pay

## 2016-08-15 ENCOUNTER — Other Ambulatory Visit: Payer: Self-pay

## 2016-08-15 DIAGNOSIS — M4716 Other spondylosis with myelopathy, lumbar region: Secondary | ICD-10-CM | POA: Diagnosis not present

## 2016-08-15 DIAGNOSIS — M4714 Other spondylosis with myelopathy, thoracic region: Secondary | ICD-10-CM | POA: Diagnosis not present

## 2016-08-15 DIAGNOSIS — R29898 Other symptoms and signs involving the musculoskeletal system: Secondary | ICD-10-CM | POA: Diagnosis not present

## 2016-08-15 DIAGNOSIS — M4806 Spinal stenosis, lumbar region: Secondary | ICD-10-CM | POA: Diagnosis not present

## 2016-08-15 DIAGNOSIS — M4804 Spinal stenosis, thoracic region: Secondary | ICD-10-CM | POA: Diagnosis not present

## 2016-08-15 NOTE — Patient Outreach (Signed)
[facility]) Care Management  08/15/2016  Nicolas [name] 04/19/1966 [id number]  Telephonic Monthly Assessment  Outreach call #1 to patient.  Patient not reached.  RN CM left HIPAA compliant voice message with name and number for call back.  RN CM scheduled for next contact call within one week.   [name], BSN, RN, [place] Care Management Care Management Coordinator [phone] Direct [phone] Cell [phone] Office [phone] Fax [email or web address]

## 2016-08-15 NOTE — Patient Outreach (Signed)
[place] [facility]) Care Management  08/15/2016  DEMONTRE KIMBER 03/21/1966 [id number]  Inbound call from patient.  Call completed.    [name], BSN, RN, [place] Management Care Management Coordinator [phone] Direct [phone] Cell [phone] Office [phone] Fax [email or web address]

## 2016-08-16 DIAGNOSIS — R29898 Other symptoms and signs involving the musculoskeletal system: Secondary | ICD-10-CM | POA: Diagnosis not present

## 2016-08-16 DIAGNOSIS — M4714 Other spondylosis with myelopathy, thoracic region: Secondary | ICD-10-CM | POA: Diagnosis not present

## 2016-08-16 DIAGNOSIS — M4804 Spinal stenosis, thoracic region: Secondary | ICD-10-CM | POA: Diagnosis not present

## 2016-08-16 DIAGNOSIS — M4806 Spinal stenosis, lumbar region: Secondary | ICD-10-CM | POA: Diagnosis not present

## 2016-08-16 DIAGNOSIS — M4716 Other spondylosis with myelopathy, lumbar region: Secondary | ICD-10-CM | POA: Diagnosis not present

## 2016-08-16 NOTE — Patient Outreach (Addendum)
[facility]) Care Management  08/15/2016  Nicolas [name] [birth year] [id number]  Telephonic Monthly Assessment  Program:  -DM 01/30/2016 - 03/12/16 -HTN 03/12/16 Insurance:  Humana Medicare and Medicare "Extra Help"  Subjective:  Patient states he continues to go to OP PT even though he has to take the [employer].  States he would not spend his $10.00 co-pay if he did not believe it was helping him.    Providers:  Primary MD: [name] last appt 02/2016 and next appt 08/2016 Yearly exam.  OP Rehab:  Active with [place] [facility]: None  Social: Patient lives in his home with his 49 -6 year old son.   Mobility: Patient with limited mobility due to H/O past 7-8 back surgeries since 2005. Patient states financial difficulties with OP Rehab co-pays $10.00 but continues to go because he knows it is improving his condition.   States he puts a lot of effort into going to PT services due to having to catch the bus.  Patient states he would like more information regarding steering wheel adaptations to assist him with driving again.    Falls: 2 (12/2015) and (02/13/16) - remains active with OP Rehab Caregiver: 60 year old son, brother when visiting.  Transportation: W/C and city bus Resources: [employer]. H/o previously applied for Medicaid twice and did not meet eligibility approximately 2 years ago.  DME: walker, electric W/C, CBG meter (2013) (not required to test), hospital bed, mattress (replaced 01/2016).  No scales or BP cuff in the home.  DME provider:  [name].   HTN Patient confirms he received [facility] provided BP cuff and scales and is using daily.   Weight 271 lbs BP:  137/88  DM 2 - (Diet managed only and not required to test BS) ER/Admissions: 0 A1C 5.9 on 08/30/15  (H/o FNP confirmed 10/25/15 patient is only required to manage his diabetes with diet only. No medications or blood sugar testing required). Patient states he has almost  stopped drinking colas and has noted his waist size getting smaller.    Medications:  Patient taking less than 10 medications  Co-pay cost issues: none Flu Vaccine: 08/30/15 Pneumonia Vaccine: PPSV2 11/26/2008 tDAP 06/18/2014 Medication Reconciliation completed 08/15/16  Encounter Medications:  Outpatient Encounter Prescriptions as of 08/15/2016  Medication Sig Note  . calamine lotion  03/01/2016: Received from: External Pharmacy  . Calcium Carbonate Antacid 600 MG chewable tablet  03/02/2016: Takes as needed  . Camphor-Eucalyptus-Menthol (MEDICATED CHEST RUB) 4.73-1.2-2.6 % OINT  03/01/2016: Received from: External Pharmacy  . diclofenac sodium (VOLTAREN) 1 % GEL Apply 2 g topically 4 (four) times daily.   . furosemide (LASIX) 40 MG tablet Take 1 tablet (40 mg total) by mouth daily. 11/09/2015: Reports takes as needed  . Loratadine 10 MG CAPS Reported on 03/02/2016 03/01/2016: Received from: External Pharmacy  . metoprolol succinate (TOPROL-XL) 50 MG 24 hr tablet Take 1 tablet (50 mg total) by mouth every evening. Take with or immediately following a meal.   . Multiple Vitamin (MULTIVITAMIN WITH MINERALS) TABS tablet Take 1 tablet by mouth daily. Reported on 11/09/2015   . potassium chloride SA (K-DUR,KLOR-CON) 20 MEQ tablet Take 1 tablet (20 mEq total) by mouth daily. 11/09/2015: Takes as needed with furosemide.  . sertraline (ZOLOFT) 100 MG tablet Take 100 mg by mouth daily. Takes 1.5 tablets at bedtime.   Marland Kitchen warfarin (COUMADIN) 10 MG tablet Take as directed by Coumadin clinic.    No facility-administered encounter medications on file  as of 08/15/2016.     Functional Status:  In your present state of health, do you have any difficulty performing the following activities: 02/13/2016 11/09/2015  Hearing? N N  Vision? N N  Difficulty concentrating or making decisions? N N  Walking or climbing stairs? Y Y  Dressing or bathing? N -  Doing errands, shopping? Y N  Preparing Food and eating ? N Y   Using the Toilet? N (No Data)  In the past six months, have you accidently leaked urine? N Y  Do you have problems with loss of bowel control? N Y  Managing your Medications? N N  Managing your Finances? N N  Housekeeping or managing your Housekeeping? [name]  Some recent data might be hidden    Fall/Depression Screening: PHQ 2/9 Scores 08/16/2016 07/18/2016 06/15/2016 05/09/2016 04/10/2016 03/12/2016 02/28/2016  PHQ - 2 Score 0 0 0 0 0 0 0    Fall Risk  08/16/2016 07/18/2016 06/15/2016 05/09/2016 04/10/2016  Falls in the past year? Yes Yes Yes Yes Yes  Number falls in past yr: 2 or more 2 or more 2 or more 2 or more 1  Injury with Fall? No No No No No  Risk Factor Category  High Fall Risk High Fall Risk High Fall Risk High Fall Risk -  Risk for fall due to : History of fall(s);Impaired balance/gait;Impaired mobility History of fall(s);Impaired balance/gait;Impaired mobility History of fall(s);Impaired balance/gait;Impaired mobility History of fall(s);Impaired balance/gait;Impaired mobility Impaired balance/gait;Impaired mobility  Follow up Falls evaluation completed;Education provided;Falls prevention discussed Falls evaluation completed;Education provided;Falls prevention discussed Falls prevention discussed;Falls evaluation completed;Education provided Falls prevention discussed;Falls evaluation completed;Education provided Falls prevention discussed    Assessment / Plan:  Referral Date: 01/18/2016 Screening 01/30/2016 Initial Assessment: 02/13/2016 Telephonic RN CM 01/30/2016 MD Quarterly update:  07/18/2016, 08/16/16 (to prepare for annual physical).  Program:  -DM 01/30/2016 - 03/12/16 -HTN 03/12/16  DM / HTN Patient requires only diet management for DM 2 diagnosis but high risk due to obesity (271 lbs).  BP cuff and scales now in the home and actively using.  RN CM encouraged daily monitoring and logging; take to all MD appointments.  Emmi Education Materials (mailed 06/15/16) (Reviewed  07/17/2016 and 08/15/16) -High Blood Pressure (Hypertension):  Taking Your Blood Pressure -High Blood Pressure (Hypertension) What You Can Do -Low-Salt Diet  -Metabolic Syndrome:  Am I At Risk? -Counting Carbohydrates -Form:  Weight and BP Tracker  -Book: Carb counting and meal planning    Emmi education mailed 02/13/16, 03/12/16 and again 04/10/16. (Reviewed 07/17/16 and 08/15/16) -Diabetes type 2 - meal planning -Counting carbohydrates -Low-salt diet   Fall Risk / OP Rehab / Co-pays Patient is motivated to improve his health with OP Rehab services.  Patient motivated to progress his health and seek out further interventions to improve his mobility and quality of life and would like more information on steering wheel adaptations to assist with driving. Fall prevention and safety reviewed.  RN CM encouraged to continue with OP rehab services as long as patient able to afford co-pays.  RN CM advised to consult with PT today to discuss further education / information on steering wheel adaptations. Advised this would fall under OT services but PT could assist with patient with education and guidance.  RN CM praised patient for progress and staying motivated.   Advance Directive RN CM mailed copy of Advance Directive 02/13/2016, 03/12/16 and again 04/10/16. No updates this call.  RN CM will continue to monitor.   RN  CM advised in next scheduled contact call within the next 30 days and care coordination services as needed.  RN CM advised to please notify MD of any changes in condition prior to scheduled appt's.  RN CM provided contact name and # [phone] or main office # [phone] and 24-hour nurse line # [phone].  RN CM confirmed patient is aware of 911 services for urgent emergency needs.  [name], BSN, RN, [place] Care Management Care Management Coordinator [phone] Direct [phone] Cell [phone] Office [phone]  Fax [email or web address]

## 2016-08-20 ENCOUNTER — Ambulatory Visit: Payer: Self-pay

## 2016-08-24 DIAGNOSIS — M4714 Other spondylosis with myelopathy, thoracic region: Secondary | ICD-10-CM | POA: Diagnosis not present

## 2016-08-24 DIAGNOSIS — M4804 Spinal stenosis, thoracic region: Secondary | ICD-10-CM | POA: Diagnosis not present

## 2016-08-24 DIAGNOSIS — R29898 Other symptoms and signs involving the musculoskeletal system: Secondary | ICD-10-CM | POA: Diagnosis not present

## 2016-08-24 DIAGNOSIS — M4806 Spinal stenosis, lumbar region: Secondary | ICD-10-CM | POA: Diagnosis not present

## 2016-08-24 DIAGNOSIS — M4716 Other spondylosis with myelopathy, lumbar region: Secondary | ICD-10-CM | POA: Diagnosis not present

## 2016-08-28 DIAGNOSIS — M4714 Other spondylosis with myelopathy, thoracic region: Secondary | ICD-10-CM | POA: Diagnosis not present

## 2016-08-28 DIAGNOSIS — M545 Low back pain: Secondary | ICD-10-CM | POA: Diagnosis not present

## 2016-08-28 DIAGNOSIS — R29898 Other symptoms and signs involving the musculoskeletal system: Secondary | ICD-10-CM | POA: Diagnosis not present

## 2016-08-28 DIAGNOSIS — M4804 Spinal stenosis, thoracic region: Secondary | ICD-10-CM | POA: Diagnosis not present

## 2016-08-28 DIAGNOSIS — M4716 Other spondylosis with myelopathy, lumbar region: Secondary | ICD-10-CM | POA: Diagnosis not present

## 2016-09-14 ENCOUNTER — Other Ambulatory Visit: Payer: Self-pay

## 2016-09-14 NOTE — Patient Outreach (Signed)
[facility] Care Management  09/14/2016  Nicolas [name] January 20, 1966 [id number]   Telephonic Monthly Assessment  Outreach call #1 to patient.  Patient not reached.  RN CM left HIPAA compliant voice message with name and number.  RN CM scheduled for next contact call within one week.  [name], BSN, RN, [name] Management Care Management Coordinator [phone] Direct [phone] Cell [phone] Office [phone] Fax [email or web address]

## 2016-09-17 ENCOUNTER — Ambulatory Visit (INDEPENDENT_AMBULATORY_CARE_PROVIDER_SITE_OTHER): Payer: Commercial Managed Care - HMO | Admitting: Pharmacist Clinician (PhC)/ Clinical Pharmacy Specialist

## 2016-09-17 DIAGNOSIS — Z86711 Personal history of pulmonary embolism: Secondary | ICD-10-CM | POA: Diagnosis not present

## 2016-09-17 DIAGNOSIS — R600 Localized edema: Secondary | ICD-10-CM

## 2016-09-17 DIAGNOSIS — Z7901 Long term (current) use of anticoagulants: Secondary | ICD-10-CM

## 2016-09-17 LAB — POCT INR: INR: 2.2

## 2016-09-20 ENCOUNTER — Other Ambulatory Visit: Payer: Self-pay

## 2016-09-20 NOTE — Patient Outreach (Addendum)
[facility]) Care Management  09/20/2016  DEANTA COLANDREA 1966-03-06 [id number]   Telephonic Monthly Assessment  Program:  -DM 01/30/2016 - 03/12/16 -HTN 03/12/16 Insurance:  Humana Medicare and Medicare "Extra Help"  Outreach call #2 to patient.  Patient not reached.  RN CM left HIPAA compliant voice message with name and number.  RN CM scheduled for next contact call within one week.  [name], BSN, RN, [place] Care Management Care Management Coordinator [phone] Direct [phone] Cell [phone] Office [phone] Fax [email or web address]

## 2016-09-21 ENCOUNTER — Ambulatory Visit: Payer: Self-pay

## 2016-09-24 ENCOUNTER — Other Ambulatory Visit: Payer: Self-pay

## 2016-09-24 NOTE — Patient Outreach (Addendum)
[facility]) Care Management  09/24/2016  Nicolas [name] 1966-05-08 [id number]    Telephonic Monthly Assessment  Program:  -DM 01/30/2016 - 03/12/16 -HTN 03/12/16 Insurance: [employer] Medicare and Medicare "Extra Help"   Subjective: Outreach call #3 to patient. Patient reached and call completed.  Patient states he had to take a break from OP PT due to financial needs.  States plans to resume at a later time.  States progress made during time of services and happy with outcome.  Denies any falls since starting PT services.  Last fall date 01/2016.  H/o patient transported to [place] via [employer].   Patient states he would not spend his $10.00 co-pay if he did not believe it was helping him and plans to only take a break.      Providers:  Primary MD: [name] last appt 02/2016 and next appt 10/04/16 for yearly exam.  OP Rehab: [place] - on hold (due to financial cost) HH: None  Social: Patient lives in his home with his 76 -45 year old son.   Mobility: Patient with limited mobility due to H/O past 7-8 back surgeries since 2005. Patient is looking into more information regarding steering wheel adaptations to assist him with driving again.    Falls: 2 (12/2015) and (02/13/16) - OP Rehab improvements reported.   Caregiver: 71 year old son, brother when visiting.  Transportation: W/C and city bus Resources: [employer]. H/o previously applied for Medicaid twice and did not meet eligibility approximately 2 years ago. Advance Directives:  None but states he would want his HCPOA to be his daughter, Nicolas [name].   DME: walker, electric W/C, CBG meter (2013) (not required to test), hospital bed, mattress (replaced 01/2016). Home scales or BP cuff (THN provided).   DME provider:  [name].   HTN Patient confirms using scales  And BP cuff as part of his daily self management interventions.    Weight 271 lbs  (averaging 270 -275 BP:   137/88  DM 2 - (Diet managed only and not required to test BS) ER Visits:  0 Admissions: 0 A1C 5.9 on 08/30/15  (H/o FNP confirmed 10/25/15 patient is only required to manage his diabetes with diet only. No medications or blood sugar testing required). Patient states he has almost stopped drinking colas and has noted his waist size getting smaller.    Medications:  Patient taking less than 10 medications  Co-pay cost issues: none Flu Vaccine: 08/30/15 Pneumonia Vaccine: PPSV2 11/26/2008 tDAP 06/18/2014 Medication Reconciliation completed 09/24/16   Encounter Medications:  Outpatient Encounter Prescriptions as of 09/24/2016  Medication Sig Note  . calamine lotion  03/01/2016: Received from: External Pharmacy  . Calcium Carbonate Antacid 600 MG chewable tablet  03/02/2016: Takes as needed  . Camphor-Eucalyptus-Menthol (MEDICATED CHEST RUB) 4.73-1.2-2.6 % OINT  03/01/2016: Received from: External Pharmacy  . diclofenac sodium (VOLTAREN) 1 % GEL Apply 2 g topically 4 (four) times daily.   . furosemide (LASIX) 40 MG tablet Take 1 tablet (40 mg total) by mouth daily. 11/09/2015: Reports takes as needed  . Loratadine 10 MG CAPS Reported on 03/02/2016 03/01/2016: Received from: External Pharmacy  . metoprolol succinate (TOPROL-XL) 50 MG 24 hr tablet Take 1 tablet (50 mg total) by mouth every evening. Take with or immediately following a meal.   . Multiple Vitamin (MULTIVITAMIN WITH MINERALS) TABS tablet Take 1 tablet by mouth daily. Reported on 11/09/2015   . potassium chloride SA (K-DUR,KLOR-CON) 20 MEQ tablet Take  1 tablet (20 mEq total) by mouth daily. 11/09/2015: Takes as needed with furosemide.  . sertraline (ZOLOFT) 100 MG tablet Take 100 mg by mouth daily. Takes 1.5 tablets at bedtime.   Marland Kitchen warfarin (COUMADIN) 10 MG tablet Take as directed by Coumadin clinic.    No facility-administered encounter medications on file as of 09/24/2016.     Functional Status:  In your present state of  health, do you have any difficulty performing the following activities: 02/13/2016 11/09/2015  Hearing? N N  Vision? N N  Difficulty concentrating or making decisions? N N  Walking or climbing stairs? Y Y  Dressing or bathing? N -  Doing errands, shopping? Y N  Preparing Food and eating ? N Y  Using the Toilet? N (No Data)  In the past six months, have you accidently leaked urine? N Y  Do you have problems with loss of bowel control? N Y  Managing your Medications? N N  Managing your Finances? N N  Housekeeping or managing your Housekeeping? [name]  Some recent data might be hidden    Fall/Depression Screening: PHQ 2/9 Scores 09/24/2016 08/16/2016 07/18/2016 06/15/2016 05/09/2016 04/10/2016 03/12/2016  PHQ - 2 Score 0 0 0 0 0 0 0    Fall Risk  09/24/2016 08/16/2016 07/18/2016 06/15/2016 05/09/2016  Falls in the past year? Yes Yes Yes Yes Yes  Number falls in past yr: 2 or more 2 or more 2 or more 2 or more 2 or more  Injury with Fall? No No No No No  Risk Factor Category  High Fall Risk High Fall Risk High Fall Risk High Fall Risk High Fall Risk  Risk for fall due to : History of fall(s);Impaired balance/gait;Impaired mobility History of fall(s);Impaired balance/gait;Impaired mobility History of fall(s);Impaired balance/gait;Impaired mobility History of fall(s);Impaired balance/gait;Impaired mobility History of fall(s);Impaired balance/gait;Impaired mobility  Follow up Falls evaluation completed;Education provided;Falls prevention discussed Falls evaluation completed;Education provided;Falls prevention discussed Falls evaluation completed;Education provided;Falls prevention discussed Falls prevention discussed;Falls evaluation completed;Education provided Falls prevention discussed;Falls evaluation completed;Education provided     Assessment / Plan:  Referral Date: 01/18/2016 Screening 01/30/2016 Initial Assessment:  Telephonic RN CM 01/30/2016 MD Quarterly update:  07/18/2016, 08/16/16 (to  prepare for annual physical).  Program:  -DM 01/30/2016 - 03/12/16 -HTN 03/12/16  DM / HTN RN CM encouraged daily monitoring and logging; take to all MD appointments.  RN CM reviewed diet and encouraged no weight gain.  [employer] Education Materials (mailed 06/15/16) (Reviewed 07/17/2016, 08/15/16 and 09/24/16) -High Blood Pressure (Hypertension): Taking Your Blood Pressure -High Blood Pressure (Hypertension) What You Can Do -Low-Salt Diet  -Metabolic Syndrome: Am I At Risk? -Counting Carbohydrates -Form: Weight and BP Tracker  -Book: Carb counting and meal planning    Emmi education mailed 02/13/16, 03/12/16 and again 04/10/16. (Reviewed 07/17/16, 08/15/16 and 09/24/16) -Diabetes type 2 - meal planning -Counting carbohydrates -Low-salt diet   Fall Risk / OP Rehab / Co-pays RN CM encouraged to continue with OP rehab services as long as patient able to afford co-pays.  RN CM advised patient to consult with PT to discuss further education / information on steering wheel adaptations.  Advised this would fall under OT services but PT could assist with patient with education and guidance.  RN CM continues to praise patient for progress and staying motivated.   Advance Directive RN CM mailed copy of Advance Directive 02/13/2016, 03/12/16 and again 04/10/16. RN CM discussed and gave examples of reasons patient may need to have Advance Directive in place.  Patient stated he understands more clearly following conversation.  Patient states he would want his HCPOA to be his daughter Nicolas [name].  RN CM provided details on completion process and providing provider with copy. RN CM encouraged patient to have discussion regarding his wishes with his daughter Nicolas [name].   RN CM advised to review and complete as ready.  RN CM will continue to monitor progress and assist as needed.    RN CM advised in next scheduled contact call within the next 30 days and care coordination services as needed. RN CM advised to  please notify MD of any changes in condition prior to scheduled appt's.  RN CM provided contact name and # [phone] or main office # [phone] and 24-hour nurse line # [phone].  RN CM confirmed patient is aware of 911 services for urgent emergency needs.  THN CM Care Plan Problem One   Flowsheet Row Most Recent Value  Care Plan Problem One  Knowledge deficit associated to HTN home self management program.   Role Documenting the Problem One  Care Management Telephonic Coordinator  Care Plan for Problem One  Active  THN Long Term Goal (31-90 days)  Patient will develope home skills to self manage BP in the home over the next 31-90 days.   THN Long Term Goal Start Date  06/15/16  Interventions for Problem One Long Term Goal  RN CM will provide education regarding home management program by providing emmi educational materials over the next 31-90 days.   THN CM Short Term Goal #1 (0-30 days)  Patient will log readings over the next 30 days.   THN CM Short Term Goal #1 Start Date  08/16/16  [facility] CM Short Term Goal #1 Met Date  09/24/16  Interventions for Short Term Goal #1  RN CM will provide education on importance of logging and providing this information to MD on all appt's.   THN CM Short Term Goal #2 (0-30 days)  Patient will review Advance Directive as part of his healthcare manage over the next 30 days.   THN CM Short Term Goal #2 Start Date  09/24/16  Interventions for Short Term Goal #2  RN CM will review for questions on next contact call over the next 30 days.     [facility] CM Care Plan Problem Two   Flowsheet Row Most Recent Value  Care Plan Problem Two  OP Rehab potential adherence problems associated to financial barriers.   Role Documenting the Problem Two  Care Management Telephonic Coordinator  Care Plan for Problem Two  Active  THN CM Short Term Goal #1 (0-30 days)  Patient will continue with rehab over the next 30 days.   THN CM Short Term Goal #1 Start Date  08/16/16   [facility] CM Short Term Goal #1 Met Date   09/24/16  Interventions for Short Term Goal #2   RN CM will support OP Rehab and progress patient is making to improve mobility and avoid risk of falls.       [name], BSN, RN, [place] Care Management Care Management Coordinator [phone] Direct [phone] Cell [phone] Office [phone] Fax [email or web address]

## 2016-10-04 ENCOUNTER — Ambulatory Visit (INDEPENDENT_AMBULATORY_CARE_PROVIDER_SITE_OTHER): Payer: Commercial Managed Care - HMO | Admitting: Family Medicine

## 2016-10-04 ENCOUNTER — Encounter: Payer: Self-pay | Admitting: Family Medicine

## 2016-10-04 VITALS — BP 140/80 | HR 95 | Temp 98.0°F | Resp 18 | Ht 70.0 in | Wt 314.0 lb

## 2016-10-04 DIAGNOSIS — Z1211 Encounter for screening for malignant neoplasm of colon: Secondary | ICD-10-CM | POA: Diagnosis not present

## 2016-10-04 DIAGNOSIS — I10 Essential (primary) hypertension: Secondary | ICD-10-CM

## 2016-10-04 DIAGNOSIS — R350 Frequency of micturition: Secondary | ICD-10-CM | POA: Diagnosis not present

## 2016-10-04 DIAGNOSIS — Z23 Encounter for immunization: Secondary | ICD-10-CM | POA: Diagnosis not present

## 2016-10-04 DIAGNOSIS — Z1329 Encounter for screening for other suspected endocrine disorder: Secondary | ICD-10-CM

## 2016-10-04 DIAGNOSIS — R600 Localized edema: Secondary | ICD-10-CM

## 2016-10-04 DIAGNOSIS — Z130 Encounter for screening for diseases of the blood and blood-forming organs and certain disorders involving the immune mechanism: Secondary | ICD-10-CM | POA: Diagnosis not present

## 2016-10-04 DIAGNOSIS — R152 Fecal urgency: Secondary | ICD-10-CM

## 2016-10-04 DIAGNOSIS — E11620 Type 2 diabetes mellitus with diabetic dermatitis: Secondary | ICD-10-CM | POA: Diagnosis not present

## 2016-10-04 DIAGNOSIS — Z7901 Long term (current) use of anticoagulants: Secondary | ICD-10-CM | POA: Diagnosis not present

## 2016-10-04 DIAGNOSIS — R339 Retention of urine, unspecified: Secondary | ICD-10-CM

## 2016-10-04 DIAGNOSIS — Z0000 Encounter for general adult medical examination without abnormal findings: Secondary | ICD-10-CM

## 2016-10-04 DIAGNOSIS — Z1212 Encounter for screening for malignant neoplasm of rectum: Secondary | ICD-10-CM

## 2016-10-04 DIAGNOSIS — Z1383 Encounter for screening for respiratory disorder NEC: Secondary | ICD-10-CM

## 2016-10-04 DIAGNOSIS — Z125 Encounter for screening for malignant neoplasm of prostate: Secondary | ICD-10-CM

## 2016-10-04 DIAGNOSIS — Z1389 Encounter for screening for other disorder: Secondary | ICD-10-CM

## 2016-10-04 DIAGNOSIS — Z136 Encounter for screening for cardiovascular disorders: Secondary | ICD-10-CM

## 2016-10-04 DIAGNOSIS — R6889 Other general symptoms and signs: Secondary | ICD-10-CM | POA: Diagnosis not present

## 2016-10-04 LAB — MICROALBUMIN / CREATININE URINE RATIO
Creatinine, Urine: 183 mg/dL (ref 20–370)
Microalb Creat Ratio: 3 mcg/mg creat (ref <30)
Microalb, Ur: 0.6 mg/dL

## 2016-10-04 LAB — COMPREHENSIVE METABOLIC PANEL
ALT: 14 U/L (ref 9–46)
AST: 16 U/L (ref 10–40)
Albumin: 4 g/dL (ref 3.6–5.1)
Alkaline Phosphatase: 52 U/L (ref 40–115)
BUN: 14 mg/dL (ref 7–25)
CO2: 30 mmol/L (ref 20–31)
Calcium: 9.2 mg/dL (ref 8.6–10.3)
Chloride: 103 mmol/L (ref 98–110)
Creat: 0.8 mg/dL (ref 0.60–1.35)
Glucose, Bld: 87 mg/dL (ref 65–99)
Potassium: 4.3 mmol/L (ref 3.5–5.3)
Sodium: 139 mmol/L (ref 135–146)
Total Bilirubin: 0.7 mg/dL (ref 0.2–1.2)
Total Protein: 7.3 g/dL (ref 6.1–8.1)

## 2016-10-04 LAB — HEMOGLOBIN A1C
Hgb A1c MFr Bld: 5.5 % (ref <5.7)
Mean Plasma Glucose: 111 mg/dL

## 2016-10-04 LAB — URINALYSIS, MICROSCOPIC ONLY
Bacteria, UA: NONE SEEN [HPF]
Casts: NONE SEEN [LPF]
Crystals: NONE SEEN [HPF]
RBC / HPF: NONE SEEN RBC/HPF (ref <=2)
Squamous Epithelial / LPF: NONE SEEN [HPF] (ref <=5)
WBC, UA: NONE SEEN WBC/HPF (ref <=5)
Yeast: NONE SEEN [HPF]

## 2016-10-04 LAB — LIPID PANEL
Cholesterol: 134 mg/dL (ref <200)
HDL: 39 mg/dL — ABNORMAL LOW (ref >40)
LDL Cholesterol: 72 mg/dL
Total CHOL/HDL Ratio: 3.4 Ratio (ref <5.0)
Triglycerides: 113 mg/dL (ref <150)
VLDL: 23 mg/dL (ref <30)

## 2016-10-04 LAB — POCT URINALYSIS DIP (MANUAL ENTRY)
Bilirubin, UA: NEGATIVE
Blood, UA: NEGATIVE
Glucose, UA: NEGATIVE
Ketones, POC UA: NEGATIVE
Leukocytes, UA: NEGATIVE
Nitrite, UA: NEGATIVE
Protein Ur, POC: NEGATIVE
Spec Grav, UA: 1.015
Urobilinogen, UA: 0.2
pH, UA: 6

## 2016-10-04 MED ORDER — METOPROLOL SUCCINATE ER 50 MG PO TB24: 50.0000 mg | ORAL_TABLET | Freq: Every evening | ORAL | 3 refills | Status: DC

## 2016-10-04 MED ORDER — SERTRALINE HCL 100 MG PO TABS: 150.0000 mg | ORAL_TABLET | Freq: Every day | ORAL | 3 refills | Status: DC

## 2016-10-04 NOTE — Progress Notes (Signed)
Subjective:    Nicolas [name] is a 50 y.o. male who presents for Medicare Annual/Subsequent preventive examination.   Preventive Screening-Counseling & Management  Tobacco History  Smoking Status  . Former Smoker  . Packs/day: 1.50  . Years: 10.00  . Types: Cigarettes  . Quit date: 04/26/2001  Smokeless Tobacco  . Never Used    Problems Prior to Visit 1. DMII: used to be on metformin but he was able to come off of it and does not need to check sugars, does not know where meter is. Sees optho annually but not yet this yr - in [place] [facility]. 2. HTN: lasix 40 w/ K 20 prn for when he gets chest congestion but very rarely needs this and his pedal edema has improved over time, toprol 50 regluarly 3. Obesity 4. H/o DVT/PE: on warfarin for anticoag, followed at cardiology.  Has an IVC filter left in place has had bilateral DVT and PEs -34 times of unknown etiology 5.  Debility - uses motorized scooter. Can ambulate short distances and transfer - first developed in 2004 and then ended up in a motorzied wheelchair around 2012.  He has had 7 back surgeries thoughout entire spinal cord.  He has neuropathy in all 4 extremities though legs > arm.  Numbness and weakness are worse in right leg and left arm. He is right-handed.   6.  Mood d/o - on sertraline 150mg  with decent result. Not seeing PT currently due to cost but still has contacts and plans to resume. 7.  Chronic pain with arthralgias and neuropathy: followed with PT and neurosurg Dr. Jonathon Jordan in [place] out of [place].  He did his last 2 surgeries - most recent in 2014. On voltaren gel prn but uses rarely 8. osa - could not tolerate cpap so does not have machine.  Feels like he is sleeping fine. He has to wake up to role over.   Current Problems (verified) Patient Active Problem List   Diagnosis Date Noted  . Risk for falls 03/12/2016  . Hypoxemia 05/23/2014  . Chronic anticoagulation 05/21/2014  . Spinal stenosis 12/02/2013   . Type II diabetes mellitus ([place]) 08/03/2013  . Venous stasis dermatitis 09/26/2012  . Encounter for long-term (current) use of anticoagulants 03/26/2012  . Acute venous embolism and thrombosis of deep vessels of proximal lower extremity ([place]) 03/26/2012  . Spinal stenosis of lumbar region 03/06/2012  . HTN (hypertension) 03/06/2012  . Hx pulmonary embolism 03/06/2012  . Morbid obesity ([place]) 03/06/2012  . Lower extremity edema 03/06/2012    Medications Prior to Visit Current Outpatient Prescriptions on File Prior to Visit  Medication Sig Dispense Refill  . calamine lotion     . Calcium Carbonate Antacid 600 MG chewable tablet     . Camphor-Eucalyptus-Menthol (MEDICATED CHEST RUB) 4.73-1.2-2.6 % OINT     . diclofenac sodium (VOLTAREN) 1 % GEL Apply 2 g topically 4 (four) times daily. 300 g 0  . furosemide (LASIX) 40 MG tablet Take 1 tablet (40 mg total) by mouth daily. 90 tablet 3  . Loratadine 10 MG CAPS Reported on 03/02/2016    . metoprolol succinate (TOPROL-XL) 50 MG 24 hr tablet Take 1 tablet (50 mg total) by mouth every evening. Take with or immediately following a meal. 90 tablet 3  . Multiple Vitamin (MULTIVITAMIN WITH MINERALS) TABS tablet Take 1 tablet by mouth daily. Reported on 11/09/2015    . potassium chloride SA (K-DUR,KLOR-CON) 20 MEQ tablet Take 1 tablet (20 mEq total)  by mouth daily. 90 tablet 3  . sertraline (ZOLOFT) 100 MG tablet Take 100 mg by mouth daily. Takes 1.5 tablets at bedtime.    Marland Kitchen warfarin (COUMADIN) 10 MG tablet Take as directed by Coumadin clinic. 90 tablet 0   No current facility-administered medications on file prior to visit.     Current Medications (verified) Current Outpatient Prescriptions  Medication Sig Dispense Refill  . calamine lotion     . Calcium Carbonate Antacid 600 MG chewable tablet     . Camphor-Eucalyptus-Menthol (MEDICATED CHEST RUB) 4.73-1.2-2.6 % OINT     . diclofenac sodium (VOLTAREN) 1 % GEL Apply 2 g topically 4 (four) times  daily. 300 g 0  . furosemide (LASIX) 40 MG tablet Take 1 tablet (40 mg total) by mouth daily. 90 tablet 3  . Loratadine 10 MG CAPS Reported on 03/02/2016    . metoprolol succinate (TOPROL-XL) 50 MG 24 hr tablet Take 1 tablet (50 mg total) by mouth every evening. Take with or immediately following a meal. 90 tablet 3  . Multiple Vitamin (MULTIVITAMIN WITH MINERALS) TABS tablet Take 1 tablet by mouth daily. Reported on 11/09/2015    . potassium chloride SA (K-DUR,KLOR-CON) 20 MEQ tablet Take 1 tablet (20 mEq total) by mouth daily. 90 tablet 3  . sertraline (ZOLOFT) 100 MG tablet Take 100 mg by mouth daily. Takes 1.5 tablets at bedtime.    Marland Kitchen warfarin (COUMADIN) 10 MG tablet Take as directed by Coumadin clinic. 90 tablet 0   No current facility-administered medications for this visit.      Allergies (verified) Lisinopril and Ace inhibitors   PAST HISTORY  Family History Family History  Problem Relation Age of Onset  . Cancer Mother 26    Lung  . Diabetes Father   . Prostate cancer Father   . Hypertension Father   . Hypertension Brother   . Hypertension Brother   . Diabetes Brother   . Heart attack Neg Hx   . Stroke Neg Hx     Social History Social History  Substance Use Topics  . Smoking status: Former Smoker    Packs/day: 1.50    Years: 10.00    Types: Cigarettes    Quit date: 04/26/2001  . Smokeless tobacco: Never Used  . Alcohol use 0.5 oz/week    1 drink(s) per week     Comment: very little    Are there smokers in your home (other than you)?  No  Risk Factors Current exercise habits: Exercise is limited by neurologic condition(s): d, orthopedic condition(s): d.  Has weights at home and  Dietary issues discussed: tries to watch quantity, cut back on sodas and sweets, and rice  Cardiac risk factors: diabetes mellitus, dyslipidemia, hypertension, male gender, obesity (BMI >= 30 kg/m2) and sedentary lifestyle.  Depression Screen  Depression screen [facility] 2/9 10/04/2016  10/04/2016 09/24/2016 08/16/2016 07/18/2016  Decreased Interest 0 0 0 0 0  Down, Depressed, Hopeless 0 0 0 0 0  PHQ - 2 Score 0 0 0 0 0     Activities of Daily Living In your present state of health, do you have any difficulty performing the following activities?:  Driving? No - trying to restart but has trouble transfering into his wheelchair by himself, can do very short distances on walker. Managing money?  No Feeding yourself? No Getting from bed to chair? Yes  Climbing a flight of stairs? Yes Preparing food and eating?: No Bathing or showering? Yes Getting dressed: No Getting to the  toilet? No Using the toilet:No Moving around from place to place: Yes In the past year have you fallen or had a near fall?:No   Are you sexually active?  No  Do you have more than one partner?  No  Hearing Difficulties: No Do you often ask people to speak up or repeat themselves? No Do you experience ringing or noises in your ears? No Do you have difficulty understanding soft or whispered voices? No   Do you feel that you have a problem with memory? No  Do you often misplace items? Yes  Do you feel safe at home?  Yes  Cognitive Testing  Alert? Yes  Normal Appearance?Yes  Oriented to person? Yes  Place? Yes   Time? Yes  Recall of three objects?  Yes  Can perform simple calculations? Yes  Displays appropriate judgment?Yes    Advanced Directives have been discussed with the patient? Yes   List the Names of Other Physician/Practitioners you currently use: 1.  Cardiology: Dr. Julianne Handler 2.  Optho 3. Neuro - Dr. Ancil Linsey   Indicate any recent Medical Services you may have received from other than [facility] providers in the past year (date may be approximate).  Immunization History  Administered Date(s) Administered  . Influenza Split 08/29/2012  . Influenza,inj,Quad PF,36+ Mos 12/03/2013, 10/19/2014, 08/30/2015  . Pneumococcal Polysaccharide-23 11/26/2008  . Tdap 06/18/2014     Screening Tests Health Maintenance  Topic Date Due  . OPHTHALMOLOGY EXAM  12/12/1976  . PNEUMOCOCCAL POLYSACCHARIDE VACCINE (2) 11/26/2013  . HEMOGLOBIN A1C  02/28/2016  . FOOT EXAM  03/21/2016  . INFLUENZA VACCINE  06/26/2016  . URINE MICROALBUMIN  08/29/2016  . TETANUS/TDAP  06/18/2024  . HIV Screening  Completed   Has some urinary frequency so tries not to drink to many fluids.  Sometimes he feels he is not emptying his bladder.  Has some dripping afterwards. Bowels have been difficult, no longer fecal incontincence but does have fecal urgency.  All answers were reviewed with the patient and necessary referrals were made:  Leshia Kope, MD   10/04/2016   History reviewed: allergies, current medications, past family history, past medical history, past social history, past surgical history and problem list  Review of Systems A comprehensive review of systems was negative except for: Genitourinary: positive for sexual problems, frequency and urinary urgency Musculoskeletal: positive for arthralgias    Objective:  BP 140/80 (BP Location: Right Arm, Patient Position: Sitting, Cuff Size: Large)   Pulse 95   Temp 98 F (36.7 C) (Oral)   Resp 18   Ht 5\' 10"  (1.778 m)   Wt (!) 314 lb (142.4 kg)   SpO2 96%   BMI 45.05 kg/m   Visual Acuity Screening   Right eye Left eye Both eyes  Without correction: 20/25 20/25 20/25   With correction:       BP 140/80 (BP Location: Right Arm, Patient Position: Sitting, Cuff Size: Large)   Pulse 95   Temp 98 F (36.7 C) (Oral)   Resp 18   Ht 5\' 10"  (1.778 m)   Wt (!) 314 lb (142.4 kg)   SpO2 96%   BMI 45.05 kg/m   General Appearance:    Alert, cooperative, no distress, appears stated age. In wheelchair  Head:    Normocephalic, without obvious abnormality, atraumatic  Eyes:    PERRL, conjunctiva/corneas clear, EOM's intact, fundi    benign, both eyes       Ears:    Normal TM's and external ear canals,  both ears  Nose:   Nares normal,  septum midline, mucosa normal, no drainage    or sinus tenderness  Throat:   Lips, mucosa, and tongue normal; teeth and gums normal  Neck:   Supple, symmetrical, trachea midline, no adenopathy;       thyroid:  No enlargement/tenderness/nodules;      Lungs:     Clear to auscultation bilaterally, respirations unlabored  Chest wall:    No tenderness or deformity  Heart:    Regular rate and rhythm, S1 and S2 normal, no murmur, rub   or gallop           Extremities:   Extremities normal, atraumatic, no cyanosis or edema  Pulses:    symmetric  Skin:   Skin color, texture, turgor normal, no rashes or lesions  Lymph nodes:   Cervical, supraclavicular, and axillary nodes normal  Neurologic:   Alert and oriented. CNII-XII grossly intact.        Diabetic Foot Exam - Simple   Simple Foot Form Diabetic Foot exam was performed with the following findings:  Yes 10/04/2016  9:57 AM  Visual Inspection No deformities, no ulcerations, no other skin breakdown bilaterally:  Yes Sensation Testing See comments:  Yes Pulse Check See comments:  Yes Comments Dorsalis pedis 2+ bilateral, trace PT pulses Decreased sensation over plantar foot bilaterally to monofilament but does have some sensation in arch and dorsum of each feet     Assessment:     1. Medicare annual wellness visit, subsequent   2. Screening for cardiovascular, respiratory, and genitourinary diseases   3. Screening for deficiency anemia   4. Screening for prostate cancer   5. Screening for thyroid disorder   6. Screening for colorectal cancer   7. Type 2 diabetes mellitus with diabetic dermatitis, without long-term current use of insulin ([place])   8. Essential hypertension   9. Morbid obesity ([place])   10. Lower extremity edema   11. Chronic anticoagulation   12. Need for prophylactic vaccination and inoculation against influenza   13. Need for prophylactic vaccination against Streptococcus pneumoniae (pneumococcus)   14. Urinary  frequency   15. Incomplete bladder emptying   16. Fecal urgency         Plan:     During the course of the visit the patient was educated and counseled about appropriate screening and preventive services including:    Immunizations: TDaP done 2015 A1c, urine microalb, ua, lipid, cmp Flu shot, foot exam, prevnar optho referral? Consider cbc, tsh,  Had PSA 18 mos prior.  Will turn 50 yo in 2 months and need psa and colonoscopy. Refer to urology and GI   Patient Instructions (the written plan) was given to the patient.  Medicare Attestation I have personally reviewed: The patient's medical and social history Their use of alcohol, tobacco or illicit drugs Their current medications and supplements The patient's functional ability including ADLs,fall risks, home safety risks, cognitive, and hearing and visual impairment Diet and physical activities Evidence for depression or mood disorders  The patient's weight, height, BMI, and visual acuity have been recorded in the chart.  I have made referrals, counseling, and provided education to the patient based on review of the above and I have provided the patient with a written personalized care plan for preventive services.     [name], MD   10/04/2016    Orders Placed This Encounter  Procedures  . Flu Vaccine QUAD 36+ mos IM  . Pneumococcal polysaccharide vaccine 23-valent  greater than or equal to 2yo subcutaneous/IM  . Hemoglobin A1c  . Microalbumin/Creatinine Ratio, Urine  . Lipid panel    Order Specific Question:   Has the patient fasted?    Answer:   Yes  . Comprehensive metabolic panel    Order Specific Question:   Has the patient fasted?    Answer:   Yes  . Urinalysis, microscopic only  . Ambulatory referral to Gastroenterology    Referral Priority:   Routine    Referral Type:   Consultation    Referral Reason:   Specialty Services Required    Number of Visits Requested:   1  . Ambulatory referral to Urology     Referral Priority:   Routine    Referral Type:   Consultation    Referral Reason:   Specialty Services Required    Requested Specialty:   Urology    Number of Visits Requested:   1  . POCT urinalysis dipstick  . HM DIABETES FOOT EXAM    Meds ordered this encounter  Medications  . sertraline (ZOLOFT) 100 MG tablet    Sig: Take 1.5 tablets (150 mg total) by mouth daily.    Dispense:  135 tablet    Refill:  3  . metoprolol succinate (TOPROL-XL) 50 MG 24 hr tablet    Sig: Take 1 tablet (50 mg total) by mouth every evening. Take with or immediately following a meal.    Dispense:  90 tablet    Refill:  3    [name], M.D.  [facility] [address] [place], [place] [address] [phone] phone [phone] fax  10/15/16 12:51 PM

## 2016-10-04 NOTE — Patient Instructions (Addendum)
IF you received an x-ray today, you will receive an invoice from [facility]. Please contact [facility] at [phone] with questions or concerns regarding your invoice.   IF you received labwork today, you will receive an invoice from [employer]. Please contact [facility] at [phone] with questions or concerns regarding your invoice.   Our billing staff will not be able to assist you with questions regarding bills from these companies.  You will be contacted with the lab results as soon as they are available. The fastest way to get your results is to activate your My Chart account. Instructions are located on the last page of this paperwork. If you have not heard from [place] regarding the results in 2 weeks, please contact this office.     Mr. Nicolas [name] , Thank you for taking time to come for your Medicare Wellness Visit. I appreciate your ongoing commitment to your health goals. Please review the following plan we discussed and let me know if I can assist you in the future.   These are the goals we discussed: Goals    None      This is a list of the screening recommended for you and due dates:  Health Maintenance  Topic Date Due  . Eye exam for diabetics  12/12/1976  . Pneumococcal vaccine (2) 11/26/2013  . Hemoglobin A1C  02/28/2016  . Complete foot exam   03/21/2016  . Flu Shot  06/26/2016  . Urine Protein Check  08/29/2016  . Tetanus Vaccine  06/18/2024  . HIV Screening  Completed   Health Maintenance, Male A healthy lifestyle and preventative care can promote health and wellness.  Maintain regular health, dental, and eye exams.  Eat a healthy diet. Foods like vegetables, fruits, whole grains, low-fat dairy products, and lean protein foods contain the nutrients you need and are low in calories. Decrease your intake of foods high in solid fats, added sugars, and salt. Get information about a proper diet from your health care  provider, if necessary.  Regular physical exercise is one of the most important things you can do for your health. Most adults should get at least 150 minutes of moderate-intensity exercise (any activity that increases your heart rate and causes you to sweat) each week. In addition, most adults need muscle-strengthening exercises on 2 or more days a week.   Maintain a healthy weight. The body mass index (BMI) is a screening tool to identify possible weight problems. It provides an estimate of body fat based on height and weight. Your health care provider can find your BMI and can help you achieve or maintain a healthy weight. For males 20 years and older:  A BMI below 18.5 is considered underweight.  A BMI of 18.5 to 24.9 is normal.  A BMI of 25 to 29.9 is considered overweight.  A BMI of 30 and above is considered obese.  Maintain normal blood lipids and cholesterol by exercising and minimizing your intake of saturated fat. Eat a balanced diet with plenty of fruits and vegetables. Blood tests for lipids and cholesterol should begin at age 31 and be repeated every 5 years. If your lipid or cholesterol levels are high, you are over age 79, or you are at high risk for heart disease, you may need your cholesterol levels checked more frequently.Ongoing high lipid and cholesterol levels should be treated with medicines if diet and exercise are not working.  If you smoke, find out from your health  care provider how to quit. If you do not use tobacco, do not start.  Lung cancer screening is recommended for adults aged 63-80 years who are at high risk for developing lung cancer because of a history of smoking. A yearly low-dose CT scan of the lungs is recommended for people who have at least a 30-pack-year history of smoking and are current smokers or have quit within the past 15 years. A pack year of smoking is smoking an average of 1 pack of cigarettes a day for 1 year (for example, a 30-pack-year  history of smoking could mean smoking 1 pack a day for 30 years or 2 packs a day for 15 years). Yearly screening should continue until the smoker has stopped smoking for at least 15 years. Yearly screening should be stopped for people who develop a health problem that would prevent them from having lung cancer treatment.  If you choose to drink alcohol, do not have more than 2 drinks per day. One drink is considered to be 12 oz (360 mL) of beer, 5 oz (150 mL) of wine, or 1.5 oz (45 mL) of liquor.  Avoid the use of street drugs. Do not share needles with anyone. Ask for help if you need support or instructions about stopping the use of drugs.  High blood pressure causes heart disease and increases the risk of stroke. High blood pressure is more likely to develop in:  People who have blood pressure in the end of the normal range (100-139/85-89 mm Hg).  People who are overweight or obese.  People who are African American.  If you are 49-11 years of age, have your blood pressure checked every 3-5 years. If you are 64 years of age or older, have your blood pressure checked every year. You should have your blood pressure measured twice--once when you are at a hospital or clinic, and once when you are not at a hospital or clinic. Record the average of the two measurements. To check your blood pressure when you are not at a hospital or clinic, you can use:  An automated blood pressure machine at a pharmacy.  A home blood pressure monitor.  If you are 49-70 years old, ask your health care provider if you should take aspirin to prevent heart disease.  Diabetes screening involves taking a blood sample to check your fasting blood sugar level. This should be done once every 3 years after age 41 if you are at a normal weight and without risk factors for diabetes. Testing should be considered at a younger age or be carried out more frequently if you are overweight and have at least 1 risk factor for  diabetes.  Colorectal cancer can be detected and often prevented. Most routine colorectal cancer screening begins at the age of 82 and continues through age 41. However, your health care provider may recommend screening at an earlier age if you have risk factors for colon cancer. On a yearly basis, your health care provider may provide home test kits to check for hidden blood in the stool. A small camera at the end of a tube may be used to directly examine the colon (sigmoidoscopy or colonoscopy) to detect the earliest forms of colorectal cancer. Talk to your health care provider about this at age 53 when routine screening begins. A direct exam of the colon should be repeated every 5-10 years through age 36, unless early forms of precancerous polyps or small growths are found.  People who are at  an increased risk for hepatitis B should be screened for this virus. You are considered at high risk for hepatitis B if:  You were born in a country where hepatitis B occurs often. Talk with your health care provider about which countries are considered high risk.  Your parents were born in a high-risk country and you have not received a shot to protect against hepatitis B (hepatitis B vaccine).  You have HIV or AIDS.  You use needles to inject street drugs.  You live with, or have sex with, someone who has hepatitis B.  You are a man who has sex with other men (MSM).  You get hemodialysis treatment.  You take certain medicines for conditions like cancer, organ transplantation, and autoimmune conditions.  Hepatitis C blood testing is recommended for all people born from [birth year] through 1964 and any individual with known risk factors for hepatitis C.  Healthy men should no longer receive prostate-specific antigen (PSA) blood tests as part of routine cancer screening. Talk to your health care provider about prostate cancer screening.  Testicular cancer screening is not recommended for adolescents or  adult males who have no symptoms. Screening includes self-exam, a health care provider exam, and other screening tests. Consult with your health care provider about any symptoms you have or any concerns you have about testicular cancer.  Practice safe sex. Use condoms and avoid high-risk sexual practices to reduce the spread of sexually transmitted infections (STIs).  You should be screened for STIs, including gonorrhea and chlamydia if:  You are sexually active and are younger than 24 years.  You are older than 24 years, and your health care provider tells you that you are at risk for this type of infection.  Your sexual activity has changed since you were last screened, and you are at an increased risk for chlamydia or gonorrhea. Ask your health care provider if you are at risk.  If you are at risk of being infected with HIV, it is recommended that you take a prescription medicine daily to prevent HIV infection. This is called pre-exposure prophylaxis (PrEP). You are considered at risk if:  You are a man who has sex with other men (MSM).  You are a heterosexual man who is sexually active with multiple partners.  You take drugs by injection.  You are sexually active with a partner who has HIV.  Talk with your health care provider about whether you are at high risk of being infected with HIV. If you choose to begin PrEP, you should first be tested for HIV. You should then be tested every 3 months for as long as you are taking PrEP.  Use sunscreen. Apply sunscreen liberally and repeatedly throughout the day. You should seek shade when your shadow is shorter than you. Protect yourself by wearing long sleeves, pants, a wide-brimmed hat, and sunglasses year round whenever you are outdoors.  Tell your health care provider of new moles or changes in moles, especially if there is a change in shape or color. Also, tell your health care provider if a mole is larger than the size of a pencil  eraser.  A one-time screening for abdominal aortic aneurysm (AAA) and surgical repair of large AAAs by ultrasound is recommended for men aged 47-75 years who are current or former smokers.  Stay current with your vaccines (immunizations).   This information is not intended to replace advice given to you by your health care provider. Make sure you discuss any questions you  have with your health care provider.   Document Released: 05/10/2008 Document Revised: 12/03/2014 Document Reviewed: 04/09/2011 Elsevier Interactive Patient Education [employer].

## 2016-10-09 ENCOUNTER — Encounter: Payer: Self-pay | Admitting: Nurse Practitioner

## 2016-10-12 ENCOUNTER — Ambulatory Visit: Payer: Self-pay

## 2016-10-15 ENCOUNTER — Telehealth: Payer: Self-pay

## 2016-10-15 NOTE — Telephone Encounter (Signed)
OV notes for urology referral are not complete. OV was 11/9. Thank you!

## 2016-10-15 NOTE — Telephone Encounter (Signed)
done

## 2016-10-22 ENCOUNTER — Ambulatory Visit: Payer: Self-pay

## 2016-10-22 ENCOUNTER — Encounter: Payer: Self-pay | Admitting: Internal Medicine

## 2016-10-24 ENCOUNTER — Ambulatory Visit: Payer: Self-pay

## 2016-10-26 ENCOUNTER — Ambulatory Visit: Payer: Self-pay

## 2016-10-29 ENCOUNTER — Ambulatory Visit (INDEPENDENT_AMBULATORY_CARE_PROVIDER_SITE_OTHER): Payer: Commercial Managed Care - HMO | Admitting: *Deleted

## 2016-10-29 ENCOUNTER — Other Ambulatory Visit: Payer: Self-pay | Admitting: *Deleted

## 2016-10-29 ENCOUNTER — Ambulatory Visit: Payer: Commercial Managed Care - HMO | Admitting: Nurse Practitioner

## 2016-10-29 ENCOUNTER — Ambulatory Visit: Payer: Self-pay

## 2016-10-29 DIAGNOSIS — R600 Localized edema: Secondary | ICD-10-CM | POA: Diagnosis not present

## 2016-10-29 DIAGNOSIS — Z86711 Personal history of pulmonary embolism: Secondary | ICD-10-CM | POA: Diagnosis not present

## 2016-10-29 DIAGNOSIS — Z7901 Long term (current) use of anticoagulants: Secondary | ICD-10-CM | POA: Diagnosis not present

## 2016-10-29 LAB — POCT INR: INR: 2.4

## 2016-10-29 MED ORDER — WARFARIN SODIUM 10 MG PO TABS: ORAL_TABLET | ORAL | 0 refills | Status: DC

## 2016-10-30 ENCOUNTER — Ambulatory Visit: Payer: Self-pay

## 2016-11-01 ENCOUNTER — Ambulatory Visit: Payer: Self-pay

## 2016-11-02 ENCOUNTER — Ambulatory Visit: Payer: Self-pay

## 2016-11-05 ENCOUNTER — Ambulatory Visit: Payer: Self-pay

## 2016-11-07 ENCOUNTER — Ambulatory Visit: Payer: Self-pay

## 2016-11-08 ENCOUNTER — Ambulatory Visit: Payer: Self-pay

## 2016-11-08 ENCOUNTER — Other Ambulatory Visit: Payer: Self-pay

## 2016-11-08 NOTE — Patient Outreach (Signed)
[facility]) Care Management  11/08/2016  CORKEY SISK 1966/02/05 [id number]   Outreach call to patient.  Patient not reached.   RN CM left HIPAA compliant voice message with name and number.  RN CM scheduled for next outreach call within one week.  [name], BSN, RN, [place] Management Care Management Coordinator [phone] Direct [phone] Cell [phone] Office [phone] Fax [email or web address]

## 2016-11-09 ENCOUNTER — Ambulatory Visit: Payer: Self-pay

## 2016-11-12 ENCOUNTER — Ambulatory Visit: Payer: Self-pay

## 2016-11-13 ENCOUNTER — Ambulatory Visit: Payer: Self-pay

## 2016-11-14 ENCOUNTER — Ambulatory Visit: Payer: Self-pay

## 2016-11-15 ENCOUNTER — Ambulatory Visit: Payer: Self-pay

## 2016-11-16 ENCOUNTER — Ambulatory Visit: Payer: Self-pay

## 2016-11-20 ENCOUNTER — Ambulatory Visit: Payer: Self-pay

## 2016-11-21 ENCOUNTER — Telehealth: Payer: Self-pay

## 2016-11-21 ENCOUNTER — Ambulatory Visit: Payer: Self-pay

## 2016-11-21 DIAGNOSIS — G629 Polyneuropathy, unspecified: Secondary | ICD-10-CM

## 2016-11-21 DIAGNOSIS — R5381 Other malaise: Secondary | ICD-10-CM

## 2016-11-21 DIAGNOSIS — M4800 Spinal stenosis, site unspecified: Secondary | ICD-10-CM

## 2016-11-21 NOTE — Telephone Encounter (Signed)
Pt is needing to talk with someone about a wheelchair he states that he will explain more when we call him back  Best number [phone]

## 2016-11-22 ENCOUNTER — Other Ambulatory Visit: Payer: Self-pay

## 2016-11-22 NOTE — Patient Outreach (Signed)
[place]) Care Management  11/22/2016  Nicolas [name] 25-Jul-1966 [id number]  Telephonic Monthly Assessment  Program:  HTN 03/12/16 Insurance: [employer] and Medicare "Extra Help"  Subjective: Outreach call #2 to patient.  Patient completed call.  Patient states DME:  Motorized W/C needs.  States current W/C in need of major repairs (Provider: [facility] 1-800-96hover).  States chair needs ability to recline to relieve pressure off his spine/buttock to prevent skin breakdown. States chair needs to be able to extend legs and recline to relieve pressure off spine and leg elevation. .  Patient contacted W/C provider [facility] who advised patient cost would be over $4,000 worth of work and patient's co-pay would be almost $900.00 dollars.  Patient states he would be eligible for new chair October 2018.  (See POC)  Providers:  Primary MD: [name] last appt 02/2016  OP Rehab: [facility] - on hold (due to financial cost and cold weather increasing mobility / W/C bound ability more difficult.) HH: None  Social: Patient lives in his home with his 49 -80 year old son.   Mobility: Patient with limited mobility due to H/O past 7-8 back surgeries since 2005. Patient is looking into more information regarding steering wheel adaptations to assist him with driving again.    Falls: 2 (12/2015) and (02/13/16) - OP Rehab improvements reported.   Caregiver: 49 year old son, brother when visiting.  Transportation: Motorized W/C via city bus Resources: [employer]. H/o previously applied for Medicaid twice and did not meet eligibility approximately 2 years ago. Advance Directives:  None but states he would want his HCPOA to be his daughter, [name]. Patient has received copy of Advance Directive but has not reviewed.  DME: walker, electric W/C ([facility] 08/2012), CBG meter (2013) (not required to test), hospital bed, mattress (replaced 01/2016). Home  scales or BP cuff (THN provided).   DME provider:  [name].   HTN Patient confirms using scales  And BP cuff as part of his daily self management interventions.     DM 2 - (Diet managed only and not required to test BS) ER Visits:  0 Admissions: 0 (H/o FNP confirmed 10/25/15 patient is only required to manage his diabetes with diet only. No medications or blood sugar testing required). Patient states he has almost stopped drinking colas and has noted his waist size getting smaller.    BP 140/80 10/04/2016 Weight 314 lb (142 kg) 10/04/2016 - (Patient reports Weight 270 lbs)  Height 70 in (178 cm) 10/04/2016 BMI 45.10 (Obese Class III) 10/04/2016  Lipid Panel completed 10/04/2016 HDL 39.000 10/04/2016 LDL 72.000 10/04/2016 Cholesterol, total 134.000 10/04/2016 Triglycerides 113.000 10/04/2016 A1C 5.500 10/04/2016 Glucose Random 87.000 10/04/2016  Medications:  Patient taking less than 10 medications  Co-pay cost issues: none Prevnar (PCV13) N/D Pneumovax (PPS 10/04/2016 Flu Vaccine 10/04/2016 tDAP Vaccine 06/18/2014   Encounter Medications:  Outpatient Encounter Prescriptions as of 11/22/2016  Medication Sig Note  . diclofenac sodium (VOLTAREN) 1 % GEL Apply 2 g topically 4 (four) times daily. (Patient not taking: Reported on 10/04/2016)   . furosemide (LASIX) 40 MG tablet Take 1 tablet (40 mg total) by mouth daily. (Patient not taking: Reported on 10/04/2016) 11/09/2015: Reports takes as needed  . metoprolol succinate (TOPROL-XL) 50 MG 24 hr tablet Take 1 tablet (50 mg total) by mouth every evening. Take with or immediately following a meal.   . Multiple Vitamin (MULTIVITAMIN WITH MINERALS) TABS tablet Take 1 tablet by mouth daily. Reported on 11/09/2015   .  potassium chloride SA (K-DUR,KLOR-CON) 20 MEQ tablet Take 1 tablet (20 mEq total) by mouth daily. (Patient not taking: Reported on 10/04/2016) 11/09/2015: Takes as needed with furosemide.  . sertraline (ZOLOFT) 100 MG tablet Take  1.5 tablets (150 mg total) by mouth daily.   Marland Kitchen warfarin (COUMADIN) 10 MG tablet Take as directed by Coumadin clinic.    No facility-administered encounter medications on file as of 11/22/2016.     Functional Status:  In your present state of health, do you have any difficulty performing the following activities: 02/13/2016  Hearing? N  Vision? N  Difficulty concentrating or making decisions? N  Walking or climbing stairs? Y  Dressing or bathing? N  Doing errands, shopping? Y  Preparing Food and eating ? N  Using the Toilet? N  In the past six months, have you accidently leaked urine? N  Do you have problems with loss of bowel control? N  Managing your Medications? N  Managing your Finances? N  Housekeeping or managing your Housekeeping? Y  Some recent data might be hidden    Fall/Depression Screening: PHQ 2/9 Scores 10/04/2016 10/04/2016 09/24/2016 08/16/2016 07/18/2016 06/15/2016 05/09/2016  PHQ - 2 Score 0 0 0 0 0 0 0    Fall Risk  10/04/2016 10/04/2016 09/24/2016 08/16/2016 07/18/2016  Falls in the past year? No No Yes Yes Yes  Number falls in past yr: - - 2 or more 2 or more 2 or more  Injury with Fall? - - No No No  Risk Factor Category  - - High Fall Risk High Fall Risk High Fall Risk  Risk for fall due to : - - History of fall(s);Impaired balance/gait;Impaired mobility History of fall(s);Impaired balance/gait;Impaired mobility History of fall(s);Impaired balance/gait;Impaired mobility  Follow up - - Falls evaluation completed;Education provided;Falls prevention discussed Falls evaluation completed;Education provided;Falls prevention discussed Falls evaluation completed;Education provided;Falls prevention discussed     Assessment / Plan:  Referral Date: 01/18/2016 Screening 01/30/2016 Initial Assessment:  Telephonic RN CM 01/30/2016 MD Quarterly update:  07/18/2016, 08/16/16, 11/22/16 MD Notification of Change in Condition:  W/C 11/22/16  Program:  -DM 01/30/2016 - 03/12/16 -HTN  03/12/16 - 11/22/16 -Other:  Fall Prevention 11/22/16 - (active)  Fall Prevention:  DME (Active) Patient states DME:  Motorized W/C needs.  States current W/C in need of major repairs (Provider: [facility] 1-800-96hover).  States chair needs ability to recline to relieve pressure off his spine/buttock to prevent skin breakdown. States chair needs to be able to extend legs and recline to relieve pressure off spine and leg elevation. .  Patient contacted W/C provider [facility] who advised patient cost would be over $4,000 worth of work and patient's co-pay would be almost $900.00 dollars.  Patient states he would be eligible for new chair October 2018.    RN CM advised patient that patient needs to start with PCP for face to face evaluation to assess current chair and patient's needs.  MD can then write order for W/C Clinic Assessment to determine and verify current chair is not meeting patients's current level of care needs to warrant a new chair.   RN CM advised RN CM will notify PCP of this need.  RN CM advised patient to schedule appointment with PCP for W/C evaluation.    Advance Directive (Active) RN CM mailed copy of Advance Directive 02/13/2016, 03/12/16 and again 04/10/16. RN CM has previously discussed and gave examples of reasons patient may need to have Advance Directive in place.  Patient understood more clearly following conversation.  H/o Patient  would want his HCPOA to be his daughter [name].   RN CM provided details on completion process and providing provider with copy.  RN CM encouraged patient to have discussion regarding his wishes with his daughter [name].   RN CM advised to review and complete document when ready.  RN CM will continue to monitor progress and assist as needed.     DM / HTN (RESOLVED) RN CM encouraged daily monitoring and logging; take to all MD appointments.  RN CM reviewed diet and encouraged no weight gain.  [employer] Education Materials (mailed 06/15/16)  (Reviewed 07/17/2016, 08/15/16 and 09/24/16) -High Blood Pressure (Hypertension): Taking Your Blood Pressure -High Blood Pressure (Hypertension) What You Can Do -Low-Salt Diet  -Metabolic Syndrome: Am I At Risk? -Counting Carbohydrates -Form: Weight and BP Tracker  -Book: Carb counting and meal planning  [employer] education mailed 02/13/16, 03/12/16 and again 04/10/16. (Reviewed 07/17/16, 08/15/16 and 09/24/16) -Diabetes type 2 - meal planning -Counting carbohydrates  RN CM advised in next scheduled contact call within the next 30 days and care coordination services as needed. RN CM advised to please notify MD of any changes in condition prior to scheduled appt's.  RN CM provided contact name and # [phone] or main office # [phone] and 24-hour nurse line # [phone].  RN CM confirmed patient is aware of 911 services for urgent emergency needs.  [name], BSN, RN, [place] Management Care Management Coordinator [phone] Direct [phone] Cell [phone] Office [phone] Fax [email or web address]

## 2016-11-22 NOTE — Patient Outreach (Signed)
[facility]) Care Management  11/22/2016  Nicolas [name] 1966-06-21 [id number]  PCP, [name] notified of change in condition via Epic Fax  Fall Prevention:  DME W/C needs Patient states DME:  Motorized W/C needs.  States current W/C in need of major repairs (Provider: [facility] 1-800-96hover).  States chair needs ability to recline to relieve pressure off his spine/buttock to prevent skin breakdown. States chair needs to be able to extend legs and recline to relieve pressure off spine and leg elevation. .  Patient contacted W/C provider [facility] who advised patient cost would be over $4,000 worth of work and patient's co-pay would be almost $900.00 dollars.  Patient states he would be eligible for new chair October 2018.     RN CM advised patient that patient needs to start with PCP for face to face evaluation to assess current chair and patient's needs.  MD can then write order for W/C Clinic Assessment to determine and verify current chair is not meeting patients's current level of care needs to warrant a new chair.   RN CM advised RN CM will notify PCP of this need.  RN CM advised patient to schedule appointment with PCP for W/C evaluation.   [name], BSN, RN, [place] Management Care Management Coordinator [phone] Direct [phone] Cell [phone] Office [phone] Fax [email or web address]

## 2016-11-27 NOTE — Telephone Encounter (Signed)
Lm message

## 2016-12-01 NOTE — Telephone Encounter (Signed)
Spoke with Nicolas [name].  He states his wheelchair needs $4,000 worth of repairs - his co-pay is $800.  He states his current wheelchair only costs $3500 and he doesn't see the value of repairing his at a cost more than a new one.    He states he is due for a new one in Oct unless there is a change in his needs.  He states he would like one that reclines and elevates his legs.  If he could come in for an appointment for you to evaluate him and then refer him to get a new wheelchair.   Please advise

## 2016-12-03 NOTE — Telephone Encounter (Signed)
Yes, pt needs to make an appointment with me for a wheelchair eval - this needs to be a 30 min appt. He should have [facility] send me the forms for the progress note requirements and rxs in advance so we can complete these at the time of his visit. Also, he will need to have a PT eval for WC assessment - referral placed. He should go ahead and schedule this - fine to have it before our visit (and in fact that is definitely preferable for me -it will make his visit here easier).  We just need the PT visit, my visit, and form completion all done within the same 30d to give [place] plenty of time to fix things if they initially get rejected.

## 2016-12-04 NOTE — Telephone Encounter (Signed)
L/m with dr Manya Silvas detailed note, make appt., bring papers and referral.

## 2016-12-06 ENCOUNTER — Other Ambulatory Visit: Payer: Self-pay

## 2016-12-06 NOTE — Patient Outreach (Addendum)
[facility]) Care Management  12/06/2016  Nicolas [name] [birth year] [id number]  Care Coordination Services Insurance: [facility] and Medicare "Extra Help" H/o Update received from Elby Beck, FNP; states no longer follows patient for Primary Care.    RN CM will fax 11/22/2017 epic note regarding Fall Prevention:  DME W/C needs to new Primary:  [name].  Patient needs office appointment for W/C evaluation.   Fall Prevention:  DME W/C needs Patient states DME:  Motorized W/C needs.  States current W/C in need of major repairs (Provider: [facility] 1-800-[facility]hover).  States chair needs ability to recline to relieve pressure off his spine/buttock to prevent skin breakdown. States chair needs to be able to extend legs and recline to relieve pressure off spine and leg elevation. .  Patient contacted W/C provider [facility] who advised patient cost would be over $4,000 worth of work and patient's co-pay would be almost $900.00 dollars.  Patient states he would be eligible for new chair October 2018.     RN CM advised patient that patient needs to start with PCP for face to face evaluation to assess current chair and patient's needs.  MD can then write order for W/C Clinic Assessment to determine and verify current chair is not meeting patients's current level of care needs to warrant a new chair.   RN CM advised RN CM will notify PCP of this need.  RN CM advised patient to schedule appointment with PCP for W/C evaluation.  RN CM notified [facility] of new Primary Care MD.   Mariann Laster, MSHL, BSN, RN, [facility] Care Management Care Management Coordinator [phone] Direct [phone] Cell [phone] Office [phone] Fax [email or web address]

## 2016-12-07 ENCOUNTER — Ambulatory Visit: Payer: Commercial Managed Care - HMO | Admitting: Internal Medicine

## 2016-12-08 ENCOUNTER — Ambulatory Visit (INDEPENDENT_AMBULATORY_CARE_PROVIDER_SITE_OTHER): Payer: Commercial Managed Care - HMO | Admitting: Family Medicine

## 2016-12-08 VITALS — BP 136/80 | HR 66 | Temp 98.1°F | Resp 18 | Ht 70.0 in | Wt 304.4 lb

## 2016-12-08 DIAGNOSIS — Z993 Dependence on wheelchair: Secondary | ICD-10-CM

## 2016-12-08 DIAGNOSIS — Z7409 Other reduced mobility: Secondary | ICD-10-CM

## 2016-12-08 NOTE — Progress Notes (Signed)
Subjective:  By signing my name below, I, Nicolas [name], attest that this documentation has been prepared under the direction and in the presence of [name], MD. Electronically Signed: [name], [place]. 12/08/2016 , 1:52 PM .  Patient was seen in Room 7 .   Patient ID: Nicolas [name], male    DOB: Jun 14, 1966, 51 y.o.   MRN: [id number] Chief Complaint  Patient presents with  . wheelchair assesment   HPI Nicolas [name] is a 51 y.o. male who presents to [facility] for wheelchair assessment. He has not been back to see physical therapy yet. Patient requires a new powered wheelchair as his current non-powered wheelchair (has had for a little over 4 years) doesn't allow him to elevate his legs or recline.   He's having swelling in his legs bilaterally, noticed from the time he sits down. His lower extremity edema improves over night with elevation. His buttocks become sore, but no reported ulcers. His feet and back feel sore and pain due to unable to recline, but denies ulcers in feet and toenails are okay. He is able to shift his weight in the wheelchair. When he transfers from wheelchair to a station chair or bed, he utilizes a rolling walker or side board at home. He is able to take few steps (5-6 steps) with his walker. He manages to do daily home activities wit powered wheelchair. He requires assistance in non-powered wheelchair.   He has upper extremity numbness in both arms and hands, decreased proprioception with fingers. When using a walker, he doesn't have much shoulder pain. He is unable to stay in a manual wheelchair because he needs his arms to do his ADL's around the house, as well as leaving his house with independence. He requires to reach bathroom, bedroom and cook his meals. He is able to use manual wheelchair over straight flat surface around his house for a while, but is unable to reach his mailbox. He has imbalance to get 1 leg over the center console for scooters (ie, [employer] motor  carts), and it would lead to additional falls and instability. A cane/walker won't medically meet his needs. Manual wheelchair won't medically meet his needs.   Past Medical History:  Diagnosis Date  . Arthritis   . Clotting disorder ([place])   . Depression   . Diabetes mellitus without complication ([place])   . DVT (deep venous thrombosis) ([place])    Summer 2012  . History of home oxygen therapy    uses mouthpiece ventilation with 2 liters supplemental oxygen all the time  . Hypertension   . Neuromuscular disorder ([place])   . Pulmonary embolism ([place])    x 3 in 2005, 2008, 2010  . Sleep apnea   . Spinal stenosis    Prior to Admission medications   Medication Sig Start Date End Date Taking? Authorizing Provider  metoprolol succinate (TOPROL-XL) 50 MG 24 hr tablet Take 1 tablet (50 mg total) by mouth every evening. Take with or immediately following a meal. 10/04/16  Yes Shawnee Knapp, MD  Multiple Vitamin (MULTIVITAMIN WITH MINERALS) TABS tablet Take 1 tablet by mouth daily. Reported on 11/09/2015   Yes Historical Provider, MD  sertraline (ZOLOFT) 100 MG tablet Take 1.5 tablets (150 mg total) by mouth daily. 10/04/16  Yes Shawnee Knapp, MD  warfarin (COUMADIN) 10 MG tablet Take as directed by Coumadin clinic. 10/29/16  Yes Burnell Blanks, MD  diclofenac sodium (VOLTAREN) 1 % GEL Apply 2 g topically 4 (four)  times daily. Patient not taking: Reported on 12/08/2016 04/18/16   Elby Beck, FNP  furosemide (LASIX) 40 MG tablet Take 1 tablet (40 mg total) by mouth daily. Patient not taking: Reported on 12/08/2016 08/04/15   Burnell Blanks, MD  potassium chloride SA (K-DUR,KLOR-CON) 20 MEQ tablet Take 1 tablet (20 mEq total) by mouth daily. Patient not taking: Reported on 12/08/2016 08/04/15   Burnell Blanks, MD   Allergies  Allergen Reactions  . Lisinopril Other (See Comments)    cough  . Ace Inhibitors Other (See Comments)    cough   Review of Systems  Constitutional: Negative  for fatigue and unexpected weight change.  Eyes: Negative for visual disturbance.  Respiratory: Negative for cough, chest tightness and shortness of breath.   Cardiovascular: Negative for chest pain, palpitations and leg swelling.  Gastrointestinal: Negative for abdominal pain and [name] in stool.  Musculoskeletal: Positive for arthralgias, back pain, gait problem and myalgias. Negative for joint swelling.  Skin: Negative for rash and wound.  Neurological: Positive for weakness and numbness. Negative for dizziness, light-headedness and headaches.       Objective:   Physical Exam  Constitutional: He is oriented to person, place, and time. He appears well-developed and well-nourished. No distress.  HENT:  Head: Normocephalic and atraumatic.  Eyes: EOM are normal. Pupils are equal, round, and reactive to light.  Neck: Neck supple.  Cardiovascular: Normal rate and regular rhythm.   Pulmonary/Chest: Effort normal and breath sounds normal. No respiratory distress. He has no decreased breath sounds.  Abdominal: Bowel sounds are normal.  Musculoskeletal: Normal range of motion.  Upper extremities: mild restriction in abduction, normal internal and external rotation, mild restriction in flexion; deltoid, triceps, biceps and wrist extension grasp 5/5 Lower extremities:  Hip flexors, left 4+, right 4 Bilateral quads 4+  Hamstring, left 4+, right 4 Plantar flexion, left 4+, right 4 Dorsal flexion, 4 bilaterally Hip ROM, left mildly decreased, right moderate Knee, left ROM normal flexion and extension, right ROM moderately reduced  Neurological: He is alert and oriented to person, place, and time.  Reflex Scores:      Tricep reflexes are 0 on the right side and 0 on the left side.      Bicep reflexes are 1+ on the right side and 1+ on the left side.      Brachioradialis reflexes are 1+ on the right side and 1+ on the left side.      Patellar reflexes are 3+ on the right side and 3+ on the left  side.      Achilles reflexes are 0 on the right side and 0 on the left side. Unable to illicit tricep and achilles DTR's bilaterally  Skin: Skin is warm and dry.  Psychiatric: He has a normal mood and affect. His behavior is normal.  Nursing note and vitals reviewed.   BP 136/80   Pulse 66   Temp 98.1 F (36.7 C) (Oral)   Resp 18   Ht 5\' 10"  (1.778 m)   Wt (!) 304 lb 6 oz (138.1 kg)   SpO2 98%   BMI 43.67 kg/m     Assessment & Plan:   1. Able to mobilize using indoor motorized wheelchair   2. Able to mobilize using outdoor motorized wheelchair   3. Mobility impaired    Needs a new motorized wheelchair.  Sched PT eval for wheelchair assessment/fitting asap. Pt does not know what company would be best for him to go through  so rec he call his ins to find the preferred DME co or talk with PT about what they would rec for him. Have papers sent here for my completion asap as all will have to be done correctly (which can take several submission trials) within 90d of today  I personally performed the services described in this documentation, which was scribed in my presence. The recorded information has been reviewed and considered, and addended by me as needed.   [name], M.D.  [facility] [address] [place], [place] [address] [phone] phone [phone] fax  12/23/16 9:50 PM

## 2016-12-08 NOTE — Progress Notes (Signed)
     IF you received an x-ray today, you will receive an invoice from [facility]. Please contact [facility] at [phone] with questions or concerns regarding your invoice.   IF you received labwork today, you will receive an invoice from [facility]. Please contact [facility] at [phone] with questions or concerns regarding your invoice.   Our billing staff will not be able to assist you with questions regarding bills from these companies.  You will be contacted with the lab results as soon as they are available. The fastest way to get your results is to activate your My Chart account. Instructions are located on the last page of this paperwork. If you have not heard from us regarding the results in 2 weeks, please contact this office.     

## 2016-12-08 NOTE — Patient Instructions (Signed)
Fall Prevention in the Home Falls can cause injuries and can affect people from all age groups. There are many simple things that you can do to make your home safe and to help prevent falls. What can I do on the outside of my home?  Regularly repair the edges of walkways and driveways and fix any cracks.  Remove high doorway thresholds.  Trim any shrubbery on the main path into your home.  Use bright outdoor lighting.  Clear walkways of debris and clutter, including tools and rocks.  Regularly check that handrails are securely fastened and in good repair. Both sides of any steps should have handrails.  Install guardrails along the edges of any raised decks or porches.  Have leaves, snow, and ice cleared regularly.  Use sand or salt on walkways during winter months.  In the garage, clean up any spills right away, including grease or oil spills. What can I do in the bathroom?  Use night lights.  Install grab bars by the toilet and in the tub and shower. Do not use towel bars as grab bars.  Use non-skid mats or decals on the floor of the tub or shower.  If you need to sit down while you are in the shower, use a plastic, non-slip stool.  Keep the floor dry. Immediately clean up any water that spills on the floor.  Remove soap buildup in the tub or shower on a regular basis.  Attach bath mats securely with double-sided non-slip rug tape.  Remove throw rugs and other tripping hazards from the floor. What can I do in the bedroom?  Use night lights.  Make sure that a bedside light is easy to reach.  Do not use oversized bedding that drapes onto the floor.  Have a firm chair that has side arms to use for getting dressed.  Remove throw rugs and other tripping hazards from the floor. What can I do in the kitchen?  Clean up any spills right away.  Avoid walking on wet floors.  Place frequently used items in easy-to-reach places.  If you need to reach for something above  you, use a sturdy step stool that has a grab bar.  Keep electrical cables out of the way.  Do not use floor polish or wax that makes floors slippery. If you have to use wax, make sure that it is non-skid floor wax.  Remove throw rugs and other tripping hazards from the floor. What can I do in the stairways?  Do not leave any items on the stairs.  Make sure that there are handrails on both sides of the stairs. Fix handrails that are broken or loose. Make sure that handrails are as long as the stairways.  Check any carpeting to make sure that it is firmly attached to the stairs. Fix any carpet that is loose or worn.  Avoid having throw rugs at the top or bottom of stairways, or secure the rugs with carpet tape to prevent them from moving.  Make sure that you have a light switch at the top of the stairs and the bottom of the stairs. If you do not have them, have them installed. What are some other fall prevention tips?  Wear closed-toe shoes that fit well and support your feet. Wear shoes that have rubber soles or low heels.  When you use a stepladder, make sure that it is completely opened and that the sides are firmly locked. Have someone hold the ladder while you are using   it. Do not climb a closed stepladder.  Add color or contrast paint or tape to grab bars and handrails in your home. Place contrasting color strips on the first and last steps.  Use mobility aids as needed, such as canes, walkers, scooters, and crutches.  Turn on lights if it is dark. Replace any light bulbs that burn out.  Set up furniture so that there are clear paths. Keep the furniture in the same spot.  Fix any uneven floor surfaces.  Choose a carpet design that does not hide the edge of steps of a stairway.  Be aware of any and all pets.  Review your medicines with your healthcare provider. Some medicines can cause dizziness or changes in blood pressure, which increase your risk of falling. Talk with  your health care provider about other ways that you can decrease your risk of falls. This may include working with a physical therapist or trainer to improve your strength, balance, and endurance. This information is not intended to replace advice given to you by your health care provider. Make sure you discuss any questions you have with your health care provider. Document Released: 11/02/2002 Document Revised: 04/10/2016 Document Reviewed: 12/17/2014 Elsevier Interactive Patient Education  2017 Elsevier Inc.  

## 2016-12-10 ENCOUNTER — Ambulatory Visit (INDEPENDENT_AMBULATORY_CARE_PROVIDER_SITE_OTHER): Payer: Medicare HMO | Admitting: *Deleted

## 2016-12-10 DIAGNOSIS — Z7901 Long term (current) use of anticoagulants: Secondary | ICD-10-CM | POA: Diagnosis not present

## 2016-12-10 DIAGNOSIS — R600 Localized edema: Secondary | ICD-10-CM

## 2016-12-10 DIAGNOSIS — Z86711 Personal history of pulmonary embolism: Secondary | ICD-10-CM

## 2016-12-10 LAB — POCT INR: INR: 2.7

## 2016-12-14 ENCOUNTER — Ambulatory Visit: Payer: Commercial Managed Care - HMO

## 2016-12-20 ENCOUNTER — Ambulatory Visit: Payer: Self-pay

## 2016-12-21 ENCOUNTER — Other Ambulatory Visit: Payer: Self-pay

## 2016-12-21 NOTE — Patient Outreach (Signed)
[facility]) Care Management  12/21/2016  WALLICE GRANVILLE Oct 16, 1966 [id number]  Telephonic Monthly Assessment  Program:  HTN 03/12/16 Insurance: [employer] and Medicare "Extra Help"  Subjective: Outreach call #1 to patient.  Patient completed call.  [place] [phone] Jerilynn Mages) [phone] (H)  Providers:  Primary MD: [name] last appt 02/2016  OP Rehab: [facility], [place] Bills.  on hold (due to cold weather increasing mobility issues and electric W/C not working right now).  Patient states too hard to use the city bus with all his mobility and W/C issues but plans to resume services once these issues resolved.  HH: None  Psycho/Social: Patient lives in his home with his son (9th grade).   Mobility: Patient with limited mobility due to H/O past 7-8 back surgeries since 2005.  Patient is using a Manual W/C at this time due to electric chair needing replaced. Using a Walker  In the house to get to bathroom.  States he is not able to leave the house or hang clothes up with his manual chair.  Patient would like to resume OP PT once chair and weather issues resolved.  Falls: 2  - last fall date: (02/13/16)  Caregiver: minor son and brother when visiting.  Transportation: Motorized W/C via city bus Resources:  [employer].  -H/o previously applied for Medicaid twice and did not meet eligibility approximately 2 years ago. Advance Directives:  None but states he would want his HCPOA to be his daughter, [name]. Patient has received copy of Advance Directive but has not completed. DME: walker, electric W/C ([facility] 08/2012), CBG meter (2013) (not required to test), hospital bed, (mattress replaced 01/2016). Home scales and BP cuff ([facility] provided).  DME provider:  [name].   Current Electric W/C provider: ([facility]).    Co-morbidities:  HTN, DM 2  (H/o FNP  confirmed 10/25/15 patient is only required to manage his diabetes with diet only. No medications or blood sugar testing required).A1C 5.500 10/04/2016,  spinal stenosis of lumbar, H/o pulmonary embolism, chronic anticoagulation, morbid obesity, Fall Risk  Fall Risk - DME:  Motorized W/C needs.   Current electric W/C in need of major repairs (Provider: [facility] 1-800-96hover).  Patient needs chair with ability to recline to relieve pressure off his spine/buttock to prevent skin breakdown and extend legs with elevation to relieve pressure off spine.  Patient contacted W/C provider [facility] who advised patient cost would be over $4,000 worth of work and patient's co-pay would be almost $900.00 dollars.  Last W/C date:  ([facility] 08/2012) and would be eligible for new W/C October 2018.  Update:  Patient has completed MD face to face visit for evaluation of needs.  MD has ordered PT evaluation for W/C needs.  Patient has appointment scheduled for 12/27/2016 with physical therapist and W/C provider ([facility]).  Once assessment and needs determined; provider will submit a request to insurance for authorization / coverage determination.    HTN Patient confirms using scales and BP cuff as part of his daily self management interventions Weight 276 lbs.   BP 120/76    Medications:  Patient taking less than 10 medications  Co-pay cost issues: none Prevnar (PCV13) N/D Pneumovax (PPS 10/04/2016 Flu Vaccine 10/04/2016 tDAP Vaccine 06/18/2014  Encounter Medications:  Outpatient Encounter Prescriptions as of 12/21/2016  Medication Sig Note  . metoprolol succinate (TOPROL-XL) 50 MG 24 hr tablet Take 1 tablet (50  mg total) by mouth every evening. Take with or immediately following a meal.   . Multiple Vitamin (MULTIVITAMIN WITH MINERALS) TABS tablet Take 1 tablet by mouth daily. Reported on 11/09/2015   . sertraline (ZOLOFT) 100 MG tablet Take 1.5 tablets (150 mg total) by mouth daily.   Marland Kitchen warfarin  (COUMADIN) 10 MG tablet Take as directed by Coumadin clinic.   Marland Kitchen diclofenac sodium (VOLTAREN) 1 % GEL Apply 2 g topically 4 (four) times daily. (Patient not taking: Reported on 12/08/2016)   . furosemide (LASIX) 40 MG tablet Take 1 tablet (40 mg total) by mouth daily. (Patient not taking: Reported on 12/08/2016) 11/09/2015: Reports takes as needed  . potassium chloride SA (K-DUR,KLOR-CON) 20 MEQ tablet Take 1 tablet (20 mEq total) by mouth daily. (Patient not taking: Reported on 12/08/2016) 11/09/2015: Takes as needed with furosemide.   No facility-administered encounter medications on file as of 12/21/2016.     Functional Status:  In your present state of health, do you have any difficulty performing the following activities: 12/21/2016 11/22/2016  Hearing? N -  Vision? N -  Difficulty concentrating or making decisions? N -  Walking or climbing stairs? Y Y  Dressing or bathing? N -  Doing errands, shopping? Y -  Conservation officer, nature and eating ? N -  Using the Toilet? N -  In the past six months, have you accidently leaked urine? N -  Do you have problems with loss of bowel control? N -  Managing your Medications? N -  Managing your Finances? N -  Housekeeping or managing your Housekeeping? Y -  Some recent data might be hidden    Fall/Depression Screening:   PHQ 2/9 Scores 12/21/2016 11/22/2016 10/04/2016 10/04/2016 09/24/2016 08/16/2016 07/18/2016  PHQ - 2 Score 0 0 0 0 0 0 0      (last fall date 01/2016) Fall Risk  12/21/2016 11/22/2016 10/04/2016 10/04/2016 09/24/2016  Falls in the past year? Yes Yes No No Yes  Number falls in past yr: 2 or more 2 or more - - 2 or more  Injury with Fall? No No - - No  Risk Factor Category  High Fall Risk High Fall Risk - - High Fall Risk  Risk for fall due to : History of fall(s);Impaired balance/gait;Impaired mobility;Other (Comment) History of fall(s);Impaired balance/gait;Impaired mobility - - History of fall(s);Impaired balance/gait;Impaired mobility   Risk for fall due to (comments): W/C needs - - - -  Follow up Falls evaluation completed;Education provided;Falls prevention discussed;Follow up appointment Falls evaluation completed;Education provided;Falls prevention discussed - - Falls evaluation completed;Education provided;Falls prevention discussed    Assessment / Plan:  Referral Date: 01/18/2016 Screening 01/30/2016 Initial Assessment:  Telephonic RN CM 01/30/2016 MD Quarterly update:  07/18/2016, 08/16/16, 11/22/16 MD Notification of Change in Condition:  W/C 11/22/16  Program:  -DM 01/30/2016 - 03/12/16 -HTN 03/12/16 - 11/22/16 -Other:  Fall Prevention 11/22/16 - (active)  RN CM advised in next scheduled contact call within the next 30 days and care coordination services as needed. RN CM advised patient RN CM will close to RN CM services once W/C issue resolved and no other new issues identified.  RN CM advised to please notify MD of any changes in condition prior to scheduled appt's.  RN CM provided contact name and # [phone] or main office # [phone] and 24-hour nurse line # [phone].  RN CM confirmed patient is aware of 911 services for urgent emergency needs.  [facility] CM Care Plan Problem One   Flowsheet  Row Most Recent Value  Care Plan Problem One  Fall risk: W/C Bound and needs W/C to meet current needs.   Role Documenting the Problem One  Care Management Telephonic [place] for Problem One  Active  [facility] Long Term Goal (31-90 days)  Patient will obtain appropriate W/C to meet current needs over the next 31-90 days.   [facility] Long Term Goal Start Date  11/22/16  Interventions for Problem One Long Term Goal  RN CM will assist with care coordination of W/C needs over the next 31-90 days.   [facility] CM Short Term Goal #1 (0-30 days)  Patient will adhere to 12/27/2016 W/C appointment over the next 30 days.   [facility] CM Short Term Goal #1 Start Date  12/21/16  [facility] CM Short Term Goal #1 Met Date  12/21/16   Interventions for Short Term Goal #1  RN CM will monitor adherence and for any care coordination needs over the next 30 days.   [facility] CM Short Term Goal #2 (0-30 days)  Patient will review Advance Directive as part of his healthcare manage over the next 30 days.   [facility] CM Short Term Goal #2 Start Date  12/21/16  Interventions for Short Term Goal #2  RN CM will review for questions on next contact call over the next 30 days.       [name], BSN, RN, [place] Management Care Management Coordinator [phone] Direct [phone] Cell [phone] Office [phone] Fax [email or web address]

## 2016-12-27 DIAGNOSIS — R2689 Other abnormalities of gait and mobility: Secondary | ICD-10-CM | POA: Diagnosis not present

## 2016-12-31 ENCOUNTER — Other Ambulatory Visit: Payer: Self-pay | Admitting: Cardiovascular Disease

## 2017-01-11 ENCOUNTER — Ambulatory Visit: Payer: Commercial Managed Care - HMO

## 2017-01-11 NOTE — Telephone Encounter (Signed)
This encounter was created in error - please disregard.

## 2017-01-18 ENCOUNTER — Telehealth: Payer: Self-pay

## 2017-01-22 ENCOUNTER — Other Ambulatory Visit: Payer: Self-pay

## 2017-01-22 NOTE — Patient Outreach (Addendum)
[facility]) Care Management  01/22/2017  Nicolas [name] [birth year] [id number]  Telephonic Monthly Assessment  Program:  Fall Prevention 11/22/16 -  Insurance: [employer] Medicare and Medicare "Extra Help"  Subjective: Outreach call #1 to patient.  Patient completed call.  [place] [address] [phone]) [phone] (H)  Providers:  Primary MD:Dr. Delman [name] last appt 12/08/16  Dr. Clarene [name] last appt 02/2016  OP Rehab: [place] [facility], Nicolas [name].  on hold (due to cold weather increasing mobility issues and electric W/C not working right now).  Patient states too hard to use the city bus with all his mobility and W/C issues but plans to resume services once these issues resolved.  HH: None  Psycho/Social: Patient lives in his home with his son (9th grade).   Mobility: Patient with limited mobility due to H/O past 7-8 back surgeries since 2005.  Patient is using a Manual W/C at this time due to electric chair needing replaced. Using a Walker  In the house to get to bathroom.  States he is not able to leave the house or hang clothes up with his manual chair.  Patient would like to resume OP PT once chair and weather issues resolved.  Falls: 2  - last fall date: (02/13/16)  Caregiver: minor son and brother when visiting.  Transportation: Motorized W/C via city bus Resources:  -[employer].  -H/o previously applied for Medicaid twice and did not meet eligibility approximately 2 years ago. Advance Directives:  None but states he would want his HCPOA to be his daughter, Nicolas [name]. Patient has received copy of Advance Directive but has not completed. DME: walker, electric W/C ([facility] 08/2012), CBG meter (2013) (not required to test), hospital bed, (mattress replaced 01/2016). Home scales and BP cuff ([employer] provided).  DME provider:  Huey [name].   Current Electric W/C provider: ([facility]  [facility]).    Co-morbidities:  HTN, DM 2  (H/o FNP confirmed 10/25/15 patient is only required to manage his diabetes with diet only. No medications or blood sugar testing required).A1C 5.500 10/04/2016,  spinal stenosis of lumbar, H/o pulmonary embolism, chronic anticoagulation, morbid obesity, Fall Risk  Fall Risk - DME:  Motorized W/C needs.   Current electric W/C in need of major repairs (Provider: [facility] 1-800-96hover).  Patient needs chair with ability to recline to relieve pressure off his spine/buttock to prevent skin breakdown and extend legs with elevation to relieve pressure off spine.  Patient contacted W/C provider [facility] who advised patient cost would be over $4,000 worth of work and patient's co-pay would be almost $900.00 dollars.  Last W/C date:  ([facility] 08/2012) and would be eligible for new W/C October 2018.  Patient has completed MD face to face visit 12/08/16 with Dr. Delman [name] for mobility and W/C evaluation.   Patient completed 12/27/16 appointment  with physical therapist and W/C provider ([facility]).   Update:  [employer] has submitted request to insurance for authorization / coverage determination.  Patient states this process can take up to 3 months.   HTN Patient confirms using scales and BP cuff as part of his daily self management interventions but does not weigh everyday.  Vitals on last MD appt 12/08/2016.   Weight 304 lbs Height:  5'10" BMI 43.67 BP 136/80  Medications:  Patient taking less than 10 medications  Co-pay cost issues: none Prevnar (PCV13) N/D Pneumovax (PPS 10/04/2016 Flu Vaccine 10/04/2016 tDAP Vaccine 06/18/2014  Encounter Medications:  Outpatient Encounter Prescriptions as of 01/22/2017  Medication Sig Note  . diclofenac sodium (VOLTAREN) 1 % GEL Apply 2 g topically 4 (four) times daily. (Patient not taking: Reported on 12/08/2016)   . furosemide (LASIX) 40 MG tablet Take 1 tablet (40 mg total) by mouth daily. (Patient not taking:  Reported on 12/08/2016) 11/09/2015: Reports takes as needed  . metoprolol succinate (TOPROL-XL) 50 MG 24 hr tablet Take 1 tablet (50 mg total) by mouth every evening. Take with or immediately following a meal.   . Multiple Vitamin (MULTIVITAMIN WITH MINERALS) TABS tablet Take 1 tablet by mouth daily. Reported on 11/09/2015   . potassium chloride SA (K-DUR,KLOR-CON) 20 MEQ tablet Take 1 tablet (20 mEq total) by mouth daily. (Patient not taking: Reported on 12/08/2016) 11/09/2015: Takes as needed with furosemide.  . sertraline (ZOLOFT) 100 MG tablet Take 1.5 tablets (150 mg total) by mouth daily.   Marland Kitchen warfarin (COUMADIN) 10 MG tablet TAKE AS DIRECTED BY COUMADIN CLINIC.    No facility-administered encounter medications on file as of 01/22/2017.     Functional Status:  In your present state of health, do you have any difficulty performing the following activities: 12/21/2016 11/22/2016  Hearing? N -  Vision? N -  Difficulty concentrating or making decisions? N -  Walking or climbing stairs? Y Y  Dressing or bathing? N -  Doing errands, shopping? Y -  Conservation officer, nature and eating ? N -  Using the Toilet? N -  In the past six months, have you accidently leaked urine? N -  Do you have problems with loss of bowel control? N -  Managing your Medications? N -  Managing your Finances? N -  Housekeeping or managing your Housekeeping? Y -  Some recent data might be hidden    Fall/Depression Screening:   PHQ 2/9 Scores 12/21/2016 11/22/2016 10/04/2016 10/04/2016 09/24/2016 08/16/2016 07/18/2016  PHQ - 2 Score 0 0 0 0 0 0 0      (last fall date 01/2016) Fall Risk  12/21/2016 11/22/2016 10/04/2016 10/04/2016 09/24/2016  Falls in the past year? Yes Yes No No Yes  Number falls in past yr: 2 or more 2 or more - - 2 or more  Injury with Fall? No No - - No  Risk Factor Category  High Fall Risk High Fall Risk - - High Fall Risk  Risk for fall due to : History of fall(s);Impaired balance/gait;Impaired  mobility;Other (Comment) History of fall(s);Impaired balance/gait;Impaired mobility - - History of fall(s);Impaired balance/gait;Impaired mobility  Risk for fall due to (comments): W/C needs - - - -  Follow up Falls evaluation completed;Education provided;Falls prevention discussed;Follow up appointment Falls evaluation completed;Education provided;Falls prevention discussed - - Falls evaluation completed;Education provided;Falls prevention discussed    Assessment / Plan:  Referral Date: 01/18/2016 Screening 01/30/2016 Initial Assessment: 02/13/16 Telephonic RN CM 01/30/2016 MD Quarterly update:  07/18/2016, 08/16/16, 11/22/16 MD Notification of Change in Condition:  W/C 11/22/16  Program:  -DM 01/30/2016 - 03/12/16 -HTN 03/12/16 - 11/22/16 -Fall Prevention 11/22/16 -   RN CM advised in next scheduled contact call within the next 30 days and care coordination services as needed.  RN CM advised patient RN CM will close to RN CM services once W/C issue resolved and no other new issues identified.  RN CM advised to please notify MD of any changes in condition prior to scheduled appt's.  RN CM provided contact name and # [phone] or main office # [phone] and 24-hour nurse line #  [phone].  RN CM confirmed patient is aware of 911 services for urgent emergency needs.  [name], BSN, RN, [facility] Care Management Coordinator [phone] Direct [phone] Cell [phone] Office [phone] Fax [email or web address]

## 2017-01-23 ENCOUNTER — Telehealth: Payer: Self-pay

## 2017-01-23 NOTE — Telephone Encounter (Signed)
I don't know.  If they never got it, than I would assume I have not seen it. Is prob in my box somewhere but I'm sure no one has the time to go through that so please just have them fax over another copy.

## 2017-01-23 NOTE — Telephone Encounter (Signed)
THIS MESSAGE IS FROM BRITTANY AT [place] DR[facility]: SHE SAID THE PATIENT WAS REFERRED TO THEM AND SHE FAXED OVER A MEDICARE PLAN OF CARE TO DR. SHAW ON December 27, 2016 TO [phone] [id number] AND SHE HAS NEVER HEARD BACK FROM HER. SHE IS CALLING TO FIND OUT IF IT WAS EVER FAXED BACK? BEST PHONE [phone] ([place]) [place]

## 2017-01-23 NOTE — Telephone Encounter (Signed)
I am placing it in a red file in your box,

## 2017-01-23 NOTE — Telephone Encounter (Signed)
Please review. Philis Fendt, MS, PA-C 10:30 AM, 01/23/2017

## 2017-01-28 ENCOUNTER — Ambulatory Visit (INDEPENDENT_AMBULATORY_CARE_PROVIDER_SITE_OTHER): Payer: Medicare HMO | Admitting: *Deleted

## 2017-01-28 DIAGNOSIS — Z7901 Long term (current) use of anticoagulants: Secondary | ICD-10-CM

## 2017-01-28 DIAGNOSIS — R600 Localized edema: Secondary | ICD-10-CM | POA: Diagnosis not present

## 2017-01-28 DIAGNOSIS — Z86711 Personal history of pulmonary embolism: Secondary | ICD-10-CM | POA: Diagnosis not present

## 2017-01-28 LAB — POCT INR: INR: 2.3

## 2017-01-29 NOTE — Telephone Encounter (Signed)
Signed and placed in the to fax pile.

## 2017-02-01 ENCOUNTER — Encounter: Payer: Self-pay | Admitting: Family Medicine

## 2017-02-01 DIAGNOSIS — R296 Repeated falls: Secondary | ICD-10-CM | POA: Insufficient documentation

## 2017-02-08 ENCOUNTER — Ambulatory Visit: Payer: Medicare HMO

## 2017-02-12 ENCOUNTER — Other Ambulatory Visit: Payer: Self-pay

## 2017-02-12 NOTE — Patient Outreach (Signed)
[name]) Care Management  02/12/2017  Nicolas [name] [birth year] [id number]  Telephonic Monthly Assessment  Program:  Fall Prevention 11/22/16 -  Insurance: [place] Medicare and Medicare "Extra Help" Patient notified [employer] of Insurance Card Correction on PCP assignment:  [name] and has received new and updated card from [facility].   Subjective: Outreach call #1 to patient.  Patient completed call.  [place] [address] [phone]) [phone] (H)  Providers:  Primary MD:[name] last appt 12/08/16  [name] last appt 02/2016  Labs due 02/2017  OP Rehab: [place] [facility], Dawson Bills.  on hold (due to cold weather increasing mobility issues and electric W/C not working right now).  Patient states too hard to use the city bus with all his mobility and W/C issues but plans to resume services once these issues resolved. W/C remains pending.  HH: None  Psycho/Social: Patient lives in his home with his son (10th grade).   Mobility: Patient with limited mobility due to H/O past 7-8 back surgeries since 2005.  Patient is using a Manual W/C at this time due to electric chair needing replaced. Using a Walker  In the house to get to bathroom.  States he is not able to leave the house or hang clothes up with his manual chair.  Patient would like to resume OP PT once chair and weather issues resolved.  Falls: 2  - last fall date: (02/13/16)   Fall Risk - DME:  Motorized W/C needs.   Current electric W/C in need of major repairs (Provider: [facility] 1-800-96hover).  Patient needs chair with ability to recline to relieve pressure off his spine/buttock to prevent skin breakdown and extend legs with elevation to relieve pressure off spine.  Patient contacted W/C provider [facility] who advised patient cost would be over $4,000 worth of work and patient's co-pay would be almost  $900.00 dollars.  Last W/C date:  ([facility] 08/2012) and would be eligible for new W/C October 2018.  Patient has completed MD face to face visit 12/08/16 with [name] for mobility and W/C evaluation.   Patient completed 12/27/16 appointment  with physical therapist and W/C provider ([facility]).  Patient states this process can take up to 3 months.  02/12/17 UPDATE:  Recent W/C processing barrier resolved with patient notifying [employer] to correct insurance card to new PCP name:  [name].  Dr Brigitte Pulse in process of signing and [facility] resubmitting request again.   Caregiver: minor son and brother when visiting.  Transportation: Motorized W/C via city bus Resources:  -[employer].  -H/o previously applied for Medicaid twice and did not meet eligibility approximately 2 years ago. Advance Directives:  None but states he would want his HCPOA to be his daughter, [name]. Patient has received copy of Advance Directive but has not completed. DME: walker, electric W/C ([facility] 08/2012), CBG meter (2013) (not required to test), hospital bed, (mattress replaced 01/2016). Home scales and BP cuff ([facility]N provided).  DME provider:  [name].   Current Electric W/C provider: ([facility]).    Co-morbidities:  HTN, DM 2  (H/o FNP confirmed 10/25/15 patient is only required to manage his diabetes with diet only. No medications or blood sugar testing required).A1C 5.500 10/04/2016,  spinal stenosis of lumbar, H/o pulmonary embolism, chronic anticoagulation, morbid obesity, Fall Risk  HTN Patient confirms using scales and BP cuff as part of his daily -  weekly self management interventions. Vitals on last MD appt 12/08/2016.   Weight 304 lbs   (02/12/2017 home weight 307 lbs) Height:  5'10" BMI 43.67 BP 136/80  Medications:  Patient taking less than 10 medications  Co-pay cost issues: none Prevnar (PCV13) N/D Pneumovax (PPS 10/04/2016 Flu Vaccine 10/04/2016 tDAP Vaccine 06/18/2014  Encounter  Medications:  Outpatient Encounter Prescriptions as of 02/12/2017  Medication Sig Note  . metoprolol succinate (TOPROL-XL) 50 MG 24 hr tablet Take 1 tablet (50 mg total) by mouth every evening. Take with or immediately following a meal.   . Multiple Vitamin (MULTIVITAMIN WITH MINERALS) TABS tablet Take 1 tablet by mouth daily. Reported on 11/09/2015   . warfarin (COUMADIN) 10 MG tablet TAKE AS DIRECTED BY COUMADIN CLINIC.   Marland Kitchen diclofenac sodium (VOLTAREN) 1 % GEL Apply 2 g topically 4 (four) times daily. (Patient not taking: Reported on 12/08/2016) 02/12/2017: Patient not taking.   . furosemide (LASIX) 40 MG tablet Take 1 tablet (40 mg total) by mouth daily. (Patient not taking: Reported on 12/08/2016) 02/12/2017: Takes as needed only.     . potassium chloride SA (K-DUR,KLOR-CON) 20 MEQ tablet Take 1 tablet (20 mEq total) by mouth daily. (Patient not taking: Reported on 12/08/2016) 02/12/2017: Takes as needed with furosemide.     . sertraline (ZOLOFT) 100 MG tablet Take 1.5 tablets (150 mg total) by mouth daily. (Patient not taking: Reported on 02/12/2017) 02/12/2017: Patient not taking.    No facility-administered encounter medications on file as of 02/12/2017.     Functional Status:  In your present state of health, do you have any difficulty performing the following activities: 02/12/2017 12/21/2016  Hearing? N N  Vision? N N  Difficulty concentrating or making decisions? N N  Walking or climbing stairs? Y Y  Dressing or bathing? N N  Doing errands, shopping? [name]  Preparing Food and eating ? N N  Using the Toilet? N N  In the past six months, have you accidently leaked urine? N N  Do you have problems with loss of bowel control? N N  Managing your Medications? N N  Managing your Finances? N N  Housekeeping or managing your Housekeeping? N Y  Some recent data might be hidden    Fall/Depression Screening: PHQ 2/9 Scores 02/12/2017 12/21/2016 11/22/2016 10/04/2016 10/04/2016 09/24/2016 08/16/2016   PHQ - 2 Score 0 0 0 0 0 0 0    Fall Risk  02/12/2017 12/21/2016 11/22/2016 10/04/2016 10/04/2016  Falls in the past year? Yes Yes Yes No No  Number falls in past yr: 2 or more 2 or more 2 or more - -  Injury with Fall? No No No - -  Risk Factor Category  High Fall Risk High Fall Risk High Fall Risk - -  Risk for fall due to : History of fall(s);Impaired balance/gait;Impaired mobility History of fall(s);Impaired balance/gait;Impaired mobility;Other (Comment) History of fall(s);Impaired balance/gait;Impaired mobility - -  Risk for fall due to (comments): - W/C needs - - -  Follow up Falls evaluation completed;Education provided;Falls prevention discussed Falls evaluation completed;Education provided;Falls prevention discussed;Follow up appointment Falls evaluation completed;Education provided;Falls prevention discussed - -    Assessment / Plan:  Referral Date: 01/18/2016 Screening 01/30/2016 Initial Assessment: 02/13/16 and annual assessment 02/12/2017 Telephonic RN CM 01/30/2016 MD Quarterly update:  07/18/2016, 08/16/16, 11/22/16, 02/12/17 MD Notification of Change in Condition:  W/C 11/22/16  Program:  -DM 01/30/2016 - 03/12/16 -HTN 03/12/16 - 11/22/16 -Fall Prevention 11/22/16   Fall Risk - DME:  Motorized W/C pending No new falls since 01/2016.  New submission recently filed by [facility] with correct MD name:  [name].   RN CM encouraged patient to communicate with [facility] regarding any new barriers and resolve as quickly as possible to avoid further delays.   RN CM advised patient to contact RN CM should patient need RN CM assistance.  RN CM will continue to follow until W/C in the home and no further needs.   RN CM advised in next scheduled contact call within the next 30 days and care coordination services as needed.   RN CM advised patient RN CM will close to RN CM services once W/C issue resolved and no other new issues identified.  RN CM advised to please notify MD of any  changes in condition prior to scheduled appt's.  RN CM provided contact name and # [phone] or main office # [phone] and 24-hour nurse line # [phone].  RN CM confirmed patient is aware of 911 services for urgent emergency needs.  [name], BSN, RN, [place] Care Management Care Management Coordinator [phone] Direct [phone] Cell [phone] Office [phone] Fax Sister [email or web address]

## 2017-03-12 ENCOUNTER — Other Ambulatory Visit: Payer: Self-pay

## 2017-03-12 ENCOUNTER — Ambulatory Visit: Payer: Medicare HMO

## 2017-03-12 NOTE — Patient Outreach (Signed)
[facility]) Care Management  03/12/2017  Nicolas [name] 17-May-1966 [id number]   Telephone Assessment   Outreach attempt #1 to patient. Spoke with patient who reported he was on another important call and requested call back another day.    Plan: RN CM will make outreach attempt to patient within a week.  Enzo Montgomery, RN,BSN,CCM [place] Management Telephonic Care Management Coordinator Direct Phone: [phone] Toll Free: [phone] Fax: [phone]

## 2017-03-14 ENCOUNTER — Telehealth: Payer: Self-pay | Admitting: Family Medicine

## 2017-03-14 NOTE — Telephone Encounter (Signed)
Patient states he dropped a form off for Dr Brigitte Pulse on Thursday April 12th  to complete so that he can ride the [facility] bus, he would like to know if this has been completed.  His call back number is [phone]

## 2017-03-14 NOTE — Telephone Encounter (Signed)
Yes, I put it in the to fax file in the doctor's lounge sometime - I think last sat maybe.

## 2017-03-14 NOTE — Telephone Encounter (Signed)
Ok thank you,  MR have you seen this form if so can you look in scan box and call patient to let him know it is ready and he can come get a copy.  Thank you

## 2017-03-15 ENCOUNTER — Other Ambulatory Visit: Payer: Self-pay | Admitting: *Deleted

## 2017-03-15 ENCOUNTER — Encounter: Payer: Self-pay | Admitting: *Deleted

## 2017-03-15 ENCOUNTER — Ambulatory Visit: Payer: Self-pay

## 2017-03-15 NOTE — Patient Outreach (Addendum)
[facility]) Care Management  03/15/2017  Nicolas [name] 1965-12-01 [id number]   Telephone Assessment  Outreach # 2 spoke with patient. Patient stated he is doing better than last month call. He stated he missed his last Coumadin appointment due to lack of transportation. Patient stated he has applied for [facility] and should hear back within a week. Patient stated he has no recent MD appointments scheduled. Per patient, he power w/c has been approved and he is waiting on delivery of DME, any day now. He requested for AD information to be mailed again, because he misplaced the last paperwork. He continues to voice HTN and DM are managed. Patient has no further RNCM care coordination needs at this time. Discussed with patient to be transferred to [facility] for further disease management and he agreed.    Plan: RNCM will send RN Health Coach referral for disease management.  RNCM will send AD packet to patient, per request.  [place]ells, RN, BSN, MHA/MSL, [place] Direct Phone: [phone] Toll Free: [phone] Fax: [phone]

## 2017-03-27 ENCOUNTER — Other Ambulatory Visit: Payer: Self-pay | Admitting: Cardiovascular Disease

## 2017-04-04 ENCOUNTER — Encounter: Payer: Self-pay | Admitting: Family Medicine

## 2017-04-04 ENCOUNTER — Telehealth: Payer: Self-pay | Admitting: Family Medicine

## 2017-04-04 ENCOUNTER — Ambulatory Visit (INDEPENDENT_AMBULATORY_CARE_PROVIDER_SITE_OTHER): Payer: Medicare HMO | Admitting: Family Medicine

## 2017-04-04 VITALS — BP 130/81 | HR 80 | Temp 98.4°F | Resp 16 | Ht 70.0 in | Wt 294.0 lb

## 2017-04-04 DIAGNOSIS — R5381 Other malaise: Secondary | ICD-10-CM | POA: Diagnosis not present

## 2017-04-04 DIAGNOSIS — R634 Abnormal weight loss: Secondary | ICD-10-CM

## 2017-04-04 DIAGNOSIS — Z125 Encounter for screening for malignant neoplasm of prostate: Secondary | ICD-10-CM

## 2017-04-04 DIAGNOSIS — I872 Venous insufficiency (chronic) (peripheral): Secondary | ICD-10-CM

## 2017-04-04 DIAGNOSIS — E7439 Other disorders of intestinal carbohydrate absorption: Secondary | ICD-10-CM

## 2017-04-04 DIAGNOSIS — M48062 Spinal stenosis, lumbar region with neurogenic claudication: Secondary | ICD-10-CM

## 2017-04-04 DIAGNOSIS — R296 Repeated falls: Secondary | ICD-10-CM

## 2017-04-04 NOTE — Telephone Encounter (Signed)
Patient called in stating Dr Brigitte Pulse needed to know what home healthcare he wanted to use, he wants to use [facility]---[phone]

## 2017-04-04 NOTE — Patient Instructions (Addendum)
IF you received an x-ray today, you will receive an invoice from [facility]. Please contact [facility] at [phone] with questions or concerns regarding your invoice.   IF you received labwork today, you will receive an invoice from [facility]. Please contact [facility] at [phone] with questions or concerns regarding your invoice.   Our billing staff will not be able to assist you with questions regarding bills from these companies.  You will be contacted with the lab results as soon as they are available. The fastest way to get your results is to activate your My Chart account. Instructions are located on the last page of this paperwork. If you have not heard from [place] regarding the results in 2 weeks, please contact this office.     Metabolic Syndrome Metabolic syndrome is the presence of at least three factors that increase your risk of getting cardiovascular disease and diabetes. These factors are:  High blood sugar.  High blood triglyceride level.  High blood pressure.  Low levels of good blood cholesterol (high-density lipoprotein or HDL).  Excess weight around the waist. This factor is present with a waist measurement of:  More than 40 inches in men.  More than 35 inches in women. Metabolic syndrome is sometimes called insulin resistance syndrome and syndrome X. What are the causes? The exact cause is not known, but genetics and lifestyle choices play a role. What increases the risk? You are more likely to develop metabolic syndrome if:  You eat a diet high in calories and saturated fat.  You do not exercise regularly.  You are overweight.  You have a family history of metabolic syndrome.  You are Asian.  You are older in age.  You have insulin resistance.  You use any tobacco products, including cigarettes, chewing tobacco, or electronic cigarettes. What are the signs or symptoms? Metabolic syndrome has no specific symptoms. How  is this diagnosed? To make a diagnosis, your health care provider will determine whether you have at least three of the factors that make up metabolic syndrome by:  Taking your blood pressure.  Measuring your waist.  Ordering blood tests. How is this treated? Treatment may include:  Lifestyle changes to reduce your risk for heart disease and stroke, such as:  Exercise.  Weight loss.  Maintaining a healthy diet.  Quitting the use of any tobacco products, including cigarettes, chewing tobacco, or electronic cigarettes.  Medicines that:  Help your body to maintain glucose control.  Reduce your blood pressure and your blood triglyceride levels. Follow these instructions at home:  Exercise regularly.  Maintain a healthy diet.  Do not use any tobacco products, including cigarettes, chewing tobacco, or electronic cigarettes. If you need help quitting, ask your health care provider.  Keep all follow-up visits as directed by your health care provider. This is important.  Measure your waist regularly and record the measurement. To measure your waist:  Stand up straight.  Breathe out.  Wrap the measuring tape around the part of your waist that is just above your hipbones.  Read the measurement. Contact a health care provider if:  You feel very tired.  You develop excessive thirst.  You pass large quantities of urine.  You put on weight around your waist.  You have headaches over and over again.  You have a dizzy spell. Get help right away if:  You develop sudden blurred vision.  You develop a sudden dizzy spell.  You have sudden trouble speaking or swallowing.  You have sudden weakness in your arm or leg.  You have chest pains or trouble breathing.  You feel like your heartbeat is abnormal.  You faint. This information is not intended to replace advice given to you by your health care provider. Make sure you discuss any questions you have with your health  care provider. Document Released: 02/19/2008 Document Revised: 04/19/2016 Document Reviewed: 06/18/2014 Elsevier Interactive Patient Education  2017 [employer]. Diet for Metabolic Syndrome Metabolic syndrome is a disorder that includes at least three of these conditions:  Abdominal obesity.  Too much sugar in your blood.  High blood pressure.  Higher than normal amount of fat (lipids) in your blood.  Lower than normal level of "good" cholesterol (HDL). Following a healthy diet can help to keep metabolic syndrome under control. It can also help to prevent the development of conditions that are associated with metabolic syndrome, such as diabetes, heart disease, and stroke. Along with exercise, a healthy diet:  Helps to improve the way that the body uses insulin.  Promotes weight loss. A common goal for people with this condition is to lose at least 7 to 10 percent of their starting weight. What do I need to know about this diet?  Use the glycemic index (GI) to plan your meals. The index tells you how quickly a food will raise your blood sugar. Choose foods that have low GI values. These foods take a longer time to raise blood sugar.  Keep track of how many calories you take in. Eating the right amount of calories will help your achieve a healthy weight.  You may want to follow a Mediterranean diet. This diet includes lots of vegetables, lean meats or fish, whole grains, fruits, and healthy oils and fats. What foods can I eat? Grains  Stone-ground whole wheat. Pumpernickel bread. Whole-grain bread, crackers, tortillas, cereal, and pasta. Unsweetened oatmeal.Bulgur.Barley.Quinoa.Brown rice or wild rice. Vegetables  Lettuce. Spinach. Peas. Beets. Cauliflower. Cabbage. Broccoli. Carrots. Tomatoes. Squash. Eggplant. Herbs. Peppers. Onions. Cucumbers. Brussels sprouts. Sweet potatoes. Yams. Beans. Lentils. Fruits  Berries. Apples. Oranges. Grapes. Mango. Pomegranate. Kiwi.  Cherries. Meats and Other Protein Sources  Seafood and shellfish. Lean meats.Poultry. Tofu. Dairy  Low-fat or fat-free dairy products, such as milk, yogurt, and cheese. Beverages  Water. Low-fat milk. Milk alternatives, like soy milk or almond milk. Real fruit juice. Condiments  Low-sugar or sugar-free ketchup, barbecue sauce, and mayonnaise. Mustard. Relish. Fats and Oils  Avocado. Canola or olive oil. Nuts and nut butters.Seeds. The items listed above may not be a complete list of recommended foods or beverages. Contact your dietitian for more options.  What foods are not recommended? Red meat. Palm oil and coconut oil. Processed foods. Fried foods. Alcohol. Sweetened drinks, such as iced tea and soda. Sweets. Salty foods. The items listed above may not be a complete list of foods and beverages to avoid. Contact your dietitian for more information.  This information is not intended to replace advice given to you by your health care provider. Make sure you discuss any questions you have with your health care provider. Document Released: 03/29/2015 Document Revised: 03/23/2016 Document Reviewed: 11/24/2014 Elsevier Interactive Patient Education  2017 [employer].

## 2017-04-04 NOTE — Progress Notes (Signed)
Subjective:    Patient ID: Nicolas [name], male    DOB: [birth year], 51 y.o.   MRN: [id number] Chief Complaint  Patient presents with  . Follow-up    6 mos  . swelling legs    "better", not as swollen    HPI  Nicolas [name] is a 51 year old man here for a 6 month follow-up on his chronic medical conditions. I first met him at his annual wellness exam 6 months prior. I did see him for a mobility exam to get a new power wheelchair 4 months ago.  1. DMII: used to be on metformin but he was able to come off of it and does not need to check sugars, does not know where meter is. Sees optho annually but not yet this yr - in [facility]. Normal urine microalbumin 6 months prior.  Lab Results  Component Value Date   HGBA1C 5.5 10/04/2016   HGBA1C 5.9 (H) 08/30/2015   HGBA1C 6.0 (H) 03/22/2015   HGBA1C 5.6 10/19/2014   HGBA1C 6.0 (H) 05/22/2014    2. HTN: lasix 40 w/ K 20 prn for when he gets chest congestion but very rarely needs this and his pedal edema has improved over time, toprol 50 regluarly.  With him moving more, edea improving.  No orthostatic sxs. BP at home runs 130/80. None >140/90  3. Obesity 4. H/o DVT/PE: on warfarin for anticoag, followed at cardiology.  Has an IVC filter left in place has had bilateral DVT and PEs -34 times of unknown etiology 5.  Debility - uses motorized scooter. Can ambulate short distances and transfer - first developed in 2004 and then ended up in a motorzied wheelchair around 2012.  He has had 7 back surgeries thoughout entire spinal cord.  He has neuropathy in all 4 extremities though legs > arm.  Numbness and weakness are worse in right leg and left arm. He is right-handed.   6.  Mood d/o - on sertraline 132m with decent result. Not seeing PT currently due to cost but still has contacts and plans to resume.  Slowly went off the zoloft in March as he was doing well and no mood changes. [facility] screen [facility] 04/04/2017 02/12/2017 12/21/2016 11/22/2016  10/04/2016  Decreased Interest 0 0 0 0 0  Down, Depressed, Hopeless 0 0 0 0 0  PHQ - 2 Score 0 0 0 0 0    7.  Chronic pain with arthralgias and neuropathy: [place]  He did his last 2 surgeries - most recent in 2014. On voltaren gel prn but uses rarely 8. osa - could not tolerate cpap so does not have machine.  Feels like he is sleeping fine. He has to wake up to role over.   He has a medical bed and can lay it back further gradually as he gets use to it due to back pain. Decreased edema with amb so rarely needed lasix.   He has been steadily loosing weight since 378 lbs. He eats anyting but watches his quantity.  He is eating 3 meals of what used to be one meal. Not tryinhg. Occ dizzy/hot flash in the eveningsf or 5-10 min - dry mouth, feels clammy, he will drink water and it ill gradually go away.  Usually occurs around 10-11 at night.   Past Medical History:  Diagnosis Date  . Arthritis   . Clotting disorder ([employer]   . Depression   .  Diabetes mellitus without complication ([place])   . DVT (deep venous thrombosis) ([place])    Summer 2012  . History of home oxygen therapy    uses mouthpiece ventilation with 2 liters supplemental oxygen all the time  . Hypertension   . Neuromuscular disorder ([place])   . Pulmonary embolism ([place])    x 3 in 2005, 2008, 2010  . Sleep apnea   . Spinal stenosis    Past Surgical History:  Procedure Laterality Date  . CHOLECYSTECTOMY N/A 05/24/2014   Procedure: LAPAROSCOPIC CHOLECYSTECTOMY;  Surgeon: Zenovia Jarred, MD;  Location: [facility];  Service: General;  Laterality: N/A;  . ERCP N/A 05/25/2014   Procedure: ENDOSCOPIC RETROGRADE CHOLANGIOPANCREATOGRAPHY (ERCP);  Surgeon: Beryle Beams, MD;  Location: [place];  Service: Endoscopy;  Laterality: N/A;  . ERCP N/A 07/16/2014   Procedure: ENDOSCOPIC RETROGRADE CHOLANGIOPANCREATOGRAPHY (ERCP);  Surgeon: Beryle Beams, MD;  Location: [facility];   Service: Endoscopy;  Laterality: N/A;  . ivp filter  jan 2012  . SPINE SURGERY  11/30/11   Total 6 back surgeries  . SPINE SURGERY  14/4315   Dr. Cathren Laine in [place], [place]   Current Outpatient Prescriptions on File Prior to Visit  Medication Sig Dispense Refill  . furosemide (LASIX) 40 MG tablet Take 1 tablet (40 mg total) by mouth daily. 90 tablet 3  . metoprolol succinate (TOPROL-XL) 50 MG 24 hr tablet Take 1 tablet (50 mg total) by mouth every evening. Take with or immediately following a meal. 90 tablet 3  . Multiple Vitamin (MULTIVITAMIN WITH MINERALS) TABS tablet Take 1 tablet by mouth daily. Reported on 11/09/2015    . potassium chloride SA (K-DUR,KLOR-CON) 20 MEQ tablet Take 1 tablet (20 mEq total) by mouth daily. 90 tablet 3  . warfarin (COUMADIN) 10 MG tablet TAKE AS DIRECTED BY COUMADIN CLINIC. 90 tablet 0   No current facility-administered medications on file prior to visit.    Allergies  Allergen Reactions  . Lisinopril Other (See Comments)    cough  . Ace Inhibitors Other (See Comments)    cough   Family History  Problem Relation Age of Onset  . Cancer Mother 64       Lung  . Diabetes Father   . Prostate cancer Father   . Hypertension Father   . Hypertension Brother   . Hypertension Brother   . Diabetes Brother   . Heart attack Neg Hx   . Stroke Neg Hx    Social History   Social History  . Marital status: Single    Spouse name: N/A  . Number of children: 3  . Years of education: 10   Occupational History  . Disability    Social History Main Topics  . Smoking status: Former Smoker    Packs/day: 1.50    Years: 10.00    Types: Cigarettes    Quit date: 04/26/2001  . Smokeless tobacco: Never Used  . Alcohol use 0.5 oz/week    1 Standard drinks or equivalent per week     Comment: very little  . Drug use: No  . Sexual activity: No   Other Topics Concern  . None   Social History Narrative   Patient is single and lives at home, his daughter lives  with him.   Patient has three children.   Patient is disabled.   Patient has a 10 grade education.   Patient is right-handed.   Patient drinks 1 or 2 sodas and tea per week.  Depression screen [facility] 2/9 04/04/2017 02/12/2017 12/21/2016 11/22/2016 10/04/2016  Decreased Interest 0 0 0 0 0  Down, Depressed, Hopeless 0 0 0 0 0  PHQ - 2 Score 0 0 0 0 0    Review of Systems See hpi    Objective:   Physical Exam  Constitutional: He is oriented to person, place, and time. He appears well-developed and well-nourished. No distress.  Use rolling walker with slow antalgic gait, leaning forward  HENT:  Head: Normocephalic and atraumatic.  Eyes: Conjunctivae are normal. Pupils are equal, round, and reactive to light. No scleral icterus.  Neck: Normal range of motion. Neck supple. No thyromegaly present.  Cardiovascular: Normal rate, regular rhythm, normal heart sounds and intact distal pulses.   1+ pitting edema with thick hyperpigmented pitted skin on venous statis dermatitis on bilateral lower ext  Pulmonary/Chest: Effort normal and breath sounds normal. No respiratory distress.  Musculoskeletal: He exhibits no edema.  Lymphadenopathy:    He has no cervical adenopathy.  Neurological: He is alert and oriented to person, place, and time.  Skin: Skin is warm and dry. He is not diaphoretic.  Psychiatric: He has a normal mood and affect. His behavior is normal.     BP 130/81   Pulse 80   Temp 98.4 F (36.9 C) (Oral)   Resp 16   Ht '5\' 10"'  (1.778 m)   Wt 294 lb (133.4 kg)   SpO2 95%   BMI 42.18 kg/m   Assessment & Plan:   RTC 6 mos for CPE  Given paper rx for a Rolling walker - not rollator. - pt will call w/ the company that he uses for DME so we can fax them a rx request as well.  1. Glucose intolerance   2. Loss of weight - decreasing portion size substantially and increasing exercise substantially - no other alarm sxs, cont to monitor.  3. Screening for prostate cancer   4. Frequent  falls   5. Spinal stenosis of lumbar region with neurogenic claudication   6. Venous stasis dermatitis of both lower extremities   7. Morbid obesity ([place])   8. Debility     Orders Placed This Encounter  Procedures  . Hemoglobin A1c  . Comprehensive metabolic panel  . TSH  . CBC with Differential/Platelet  . PSA  . Hemoglobin A1c  . Care order/instruction:    Scheduling Instructions:     Complete orders, AVS and go.     [name], M.D.  Primary Care at [facility] [address] [place], [place] [address] [phone] phone [phone] fax  04/06/17 10:17 PM

## 2017-04-05 LAB — COMPREHENSIVE METABOLIC PANEL
ALT: 15 IU/L (ref 0–44)
AST: 19 IU/L (ref 0–40)
Albumin/Globulin Ratio: 1.3 (ref 1.2–2.2)
Albumin: 4 g/dL (ref 3.5–5.5)
Alkaline Phosphatase: 59 IU/L (ref 39–117)
BUN/Creatinine Ratio: 13 (ref 9–20)
BUN: 11 mg/dL (ref 6–24)
Bilirubin Total: 0.8 mg/dL (ref 0.0–1.2)
CO2: 23 mmol/L (ref 18–29)
Calcium: 9.5 mg/dL (ref 8.7–10.2)
Chloride: 100 mmol/L (ref 96–106)
Creatinine, Ser: 0.82 mg/dL (ref 0.76–1.27)
GFR calc Af Amer: 119 mL/min/{1.73_m2} (ref >59)
GFR calc non Af Amer: 103 mL/min/{1.73_m2} (ref >59)
Globulin, Total: 3.2 g/dL (ref 1.5–4.5)
Glucose: 74 mg/dL (ref 65–99)
Potassium: 4.3 mmol/L (ref 3.5–5.2)
Sodium: 141 mmol/L (ref 134–144)
Total Protein: 7.2 g/dL (ref 6.0–8.5)

## 2017-04-05 LAB — CBC WITH DIFFERENTIAL/PLATELET
Basophils Absolute: 0 10*3/uL (ref 0.0–0.2)
Basos: 0 %
EOS (ABSOLUTE): 0.2 10*3/uL (ref 0.0–0.4)
Eos: 2 %
Hematocrit: 44.2 % (ref 37.5–51.0)
Hemoglobin: 14.8 g/dL (ref 13.0–17.7)
Immature Grans (Abs): 0 10*3/uL (ref 0.0–0.1)
Immature Granulocytes: 0 %
Lymphocytes Absolute: 1.7 10*3/uL (ref 0.7–3.1)
Lymphs: 22 %
MCH: 30.1 pg (ref 26.6–33.0)
MCHC: 33.5 g/dL (ref 31.5–35.7)
MCV: 90 fL (ref 79–97)
Monocytes Absolute: 0.6 10*3/uL (ref 0.1–0.9)
Monocytes: 8 %
Neutrophils Absolute: 5.2 10*3/uL (ref 1.4–7.0)
Neutrophils: 68 %
Platelets: 235 10*3/uL (ref 150–379)
RBC: 4.92 x10E6/uL (ref 4.14–5.80)
RDW: 15.1 % (ref 12.3–15.4)
WBC: 7.6 10*3/uL (ref 3.4–10.8)

## 2017-04-05 LAB — TSH: TSH: 2.15 u[IU]/mL (ref 0.450–4.500)

## 2017-04-05 LAB — PSA: Prostate Specific Ag, Serum: 1 ng/mL (ref 0.0–4.0)

## 2017-04-05 LAB — HEMOGLOBIN A1C
Est. average glucose Bld gHb Est-mCnc: 117 mg/dL
Hgb A1c MFr Bld: 5.7 % — ABNORMAL HIGH (ref 4.8–5.6)

## 2017-04-05 NOTE — Telephone Encounter (Signed)
Pt needs a rx for a rolling walker (not a fancy rollator - just a basic one) sent there.  Dx: lumbar spinal stenosis with neurogenic claudication, debility, high fall risk

## 2017-04-05 NOTE — Telephone Encounter (Signed)
Faxed to [facility] [phone]

## 2017-04-10 DIAGNOSIS — M48062 Spinal stenosis, lumbar region with neurogenic claudication: Secondary | ICD-10-CM | POA: Diagnosis not present

## 2017-04-16 DIAGNOSIS — E0840 Diabetes mellitus due to underlying condition with diabetic neuropathy, unspecified: Secondary | ICD-10-CM | POA: Diagnosis not present

## 2017-04-16 DIAGNOSIS — M4800 Spinal stenosis, site unspecified: Secondary | ICD-10-CM | POA: Diagnosis not present

## 2017-04-30 ENCOUNTER — Ambulatory Visit (INDEPENDENT_AMBULATORY_CARE_PROVIDER_SITE_OTHER): Payer: Medicare HMO | Admitting: *Deleted

## 2017-04-30 DIAGNOSIS — Z86711 Personal history of pulmonary embolism: Secondary | ICD-10-CM | POA: Diagnosis not present

## 2017-04-30 DIAGNOSIS — Z7901 Long term (current) use of anticoagulants: Secondary | ICD-10-CM

## 2017-04-30 DIAGNOSIS — R600 Localized edema: Secondary | ICD-10-CM | POA: Diagnosis not present

## 2017-04-30 LAB — POCT INR: INR: 1.7

## 2017-04-30 MED ORDER — WARFARIN SODIUM 10 MG PO TABS: ORAL_TABLET | ORAL | 0 refills | Status: DC

## 2017-05-17 DIAGNOSIS — E0840 Diabetes mellitus due to underlying condition with diabetic neuropathy, unspecified: Secondary | ICD-10-CM | POA: Diagnosis not present

## 2017-05-17 DIAGNOSIS — M4800 Spinal stenosis, site unspecified: Secondary | ICD-10-CM | POA: Diagnosis not present

## 2017-05-28 ENCOUNTER — Ambulatory Visit (INDEPENDENT_AMBULATORY_CARE_PROVIDER_SITE_OTHER): Payer: Medicare HMO | Admitting: Pharmacist

## 2017-05-28 DIAGNOSIS — Z7901 Long term (current) use of anticoagulants: Secondary | ICD-10-CM

## 2017-05-28 DIAGNOSIS — Z86711 Personal history of pulmonary embolism: Secondary | ICD-10-CM

## 2017-05-28 DIAGNOSIS — R600 Localized edema: Secondary | ICD-10-CM | POA: Diagnosis not present

## 2017-05-28 LAB — POCT INR: INR: 1.8

## 2017-06-16 DIAGNOSIS — E0840 Diabetes mellitus due to underlying condition with diabetic neuropathy, unspecified: Secondary | ICD-10-CM | POA: Diagnosis not present

## 2017-06-16 DIAGNOSIS — M4800 Spinal stenosis, site unspecified: Secondary | ICD-10-CM | POA: Diagnosis not present

## 2017-06-19 ENCOUNTER — Ambulatory Visit (INDEPENDENT_AMBULATORY_CARE_PROVIDER_SITE_OTHER): Payer: Medicare HMO | Admitting: *Deleted

## 2017-06-19 DIAGNOSIS — Z86711 Personal history of pulmonary embolism: Secondary | ICD-10-CM

## 2017-06-19 DIAGNOSIS — R600 Localized edema: Secondary | ICD-10-CM

## 2017-06-19 DIAGNOSIS — Z7901 Long term (current) use of anticoagulants: Secondary | ICD-10-CM

## 2017-06-19 LAB — POCT INR: INR: 2.2

## 2017-07-10 ENCOUNTER — Telehealth: Payer: Self-pay | Admitting: Family Medicine

## 2017-07-10 NOTE — Telephone Encounter (Signed)
PT CALLING WANTING DR SHAW TO WRITE A LETTER STATING THAT HE WAS IN THE HOSPITAL IN 2015 FIVE TIMES AND WHY HE WAS IN THERE [facility] VISIT FOR SCHOOL

## 2017-07-16 NOTE — Telephone Encounter (Signed)
Please adv phone message was put in on 07/10/17, patient calling back for an update.

## 2017-07-17 DIAGNOSIS — E0840 Diabetes mellitus due to underlying condition with diabetic neuropathy, unspecified: Secondary | ICD-10-CM | POA: Diagnosis not present

## 2017-07-17 DIAGNOSIS — M4800 Spinal stenosis, site unspecified: Secondary | ICD-10-CM | POA: Diagnosis not present

## 2017-07-17 NOTE — Telephone Encounter (Signed)
Please let me know how I can help with this.  Please see previous message.

## 2017-07-18 ENCOUNTER — Ambulatory Visit (INDEPENDENT_AMBULATORY_CARE_PROVIDER_SITE_OTHER): Payer: Medicare HMO | Admitting: *Deleted

## 2017-07-18 DIAGNOSIS — R600 Localized edema: Secondary | ICD-10-CM | POA: Diagnosis not present

## 2017-07-18 DIAGNOSIS — Z7901 Long term (current) use of anticoagulants: Secondary | ICD-10-CM | POA: Diagnosis not present

## 2017-07-18 DIAGNOSIS — Z86711 Personal history of pulmonary embolism: Secondary | ICD-10-CM | POA: Diagnosis not present

## 2017-07-18 LAB — POCT INR: INR: 2.2

## 2017-07-23 ENCOUNTER — Encounter: Payer: Self-pay | Admitting: Family Medicine

## 2017-07-23 NOTE — Telephone Encounter (Signed)
I spoke with him and he said that he has not seen his MyChart yet and he wanted the message sent to his email. I will send him another message on mychart saying that it cannot be emailed that he should be able to just review it on there.

## 2017-07-23 NOTE — Progress Notes (Signed)
-   Mr. Nicolas [name] was hospitalized at [facility] in [place] from January 7-14, 2015 for lumbar spinal stenosis causing paraplegia. After 12/09/2013 he was discharged to stay at a skilled nursing facility for physical therapy rehabilitation.    - He was subsequently admitted February 3-12th, 2015 at the [facility] for multiple thoracic laminectomies in attempt to decompress the severe thoracic spinal stenosis and spondylosis with myelopathy resulting in T4 level paraplegia.  Upon discharge on February 12, he was transferred as an inpatient at the [facility] center where he stayed through 01/27/2014 in recovery from his surgery.  - From June 26- June 01, 2014, patient was admitted to [facility] for acute acalculous cholecystitis requiring a laparoscopic cholecystectomy and endoscopic retrograde cholangiopancreatography (ERCP).  He was again briefly hospitalized at [facility] on 07/16/2014 for the day for a repeat ERCP.

## 2017-07-23 NOTE — Telephone Encounter (Signed)
Letter was written and sent to his mychart for him to review. If he would like a signed copy faxed or mailed somewhere, please let me know.

## 2017-08-17 DIAGNOSIS — E0840 Diabetes mellitus due to underlying condition with diabetic neuropathy, unspecified: Secondary | ICD-10-CM | POA: Diagnosis not present

## 2017-08-17 DIAGNOSIS — M4800 Spinal stenosis, site unspecified: Secondary | ICD-10-CM | POA: Diagnosis not present

## 2017-08-30 ENCOUNTER — Ambulatory Visit (INDEPENDENT_AMBULATORY_CARE_PROVIDER_SITE_OTHER): Payer: Medicare HMO | Admitting: *Deleted

## 2017-08-30 DIAGNOSIS — R600 Localized edema: Secondary | ICD-10-CM

## 2017-08-30 DIAGNOSIS — Z5181 Encounter for therapeutic drug level monitoring: Secondary | ICD-10-CM

## 2017-08-30 DIAGNOSIS — Z7901 Long term (current) use of anticoagulants: Secondary | ICD-10-CM | POA: Diagnosis not present

## 2017-08-30 DIAGNOSIS — Z86711 Personal history of pulmonary embolism: Secondary | ICD-10-CM | POA: Diagnosis not present

## 2017-08-30 LAB — POCT INR: INR: 1.4

## 2017-09-04 ENCOUNTER — Telehealth: Payer: Self-pay | Admitting: Family Medicine

## 2017-09-04 NOTE — Telephone Encounter (Signed)
[facility] is calling to check on the status of a Medicare Plan of Care that was sent over in February.  They still have not gotten them back and she is going to resend them to our main fax number.  Please send off forms when complete.

## 2017-09-09 ENCOUNTER — Ambulatory Visit (INDEPENDENT_AMBULATORY_CARE_PROVIDER_SITE_OTHER): Payer: Medicare HMO | Admitting: Pharmacist

## 2017-09-09 DIAGNOSIS — Z86711 Personal history of pulmonary embolism: Secondary | ICD-10-CM

## 2017-09-09 DIAGNOSIS — Z7901 Long term (current) use of anticoagulants: Secondary | ICD-10-CM | POA: Diagnosis not present

## 2017-09-09 DIAGNOSIS — Z9181 History of falling: Secondary | ICD-10-CM

## 2017-09-09 DIAGNOSIS — R600 Localized edema: Secondary | ICD-10-CM

## 2017-09-09 LAB — POCT INR: INR: 3.1

## 2017-09-10 NOTE — Telephone Encounter (Signed)
Do you have these forms?

## 2017-09-16 DIAGNOSIS — E0840 Diabetes mellitus due to underlying condition with diabetic neuropathy, unspecified: Secondary | ICD-10-CM | POA: Diagnosis not present

## 2017-09-16 DIAGNOSIS — M4800 Spinal stenosis, site unspecified: Secondary | ICD-10-CM | POA: Diagnosis not present

## 2017-09-18 ENCOUNTER — Ambulatory Visit (INDEPENDENT_AMBULATORY_CARE_PROVIDER_SITE_OTHER): Payer: Medicare HMO | Admitting: *Deleted

## 2017-09-18 DIAGNOSIS — Z86711 Personal history of pulmonary embolism: Secondary | ICD-10-CM

## 2017-09-18 DIAGNOSIS — Z5181 Encounter for therapeutic drug level monitoring: Secondary | ICD-10-CM | POA: Diagnosis not present

## 2017-09-18 DIAGNOSIS — Z7901 Long term (current) use of anticoagulants: Secondary | ICD-10-CM | POA: Diagnosis not present

## 2017-09-18 DIAGNOSIS — R600 Localized edema: Secondary | ICD-10-CM

## 2017-09-18 LAB — POCT INR: INR: 3

## 2017-10-02 ENCOUNTER — Ambulatory Visit (INDEPENDENT_AMBULATORY_CARE_PROVIDER_SITE_OTHER): Payer: Medicare HMO | Admitting: *Deleted

## 2017-10-02 DIAGNOSIS — Z86711 Personal history of pulmonary embolism: Secondary | ICD-10-CM

## 2017-10-02 DIAGNOSIS — Z7901 Long term (current) use of anticoagulants: Secondary | ICD-10-CM | POA: Diagnosis not present

## 2017-10-02 DIAGNOSIS — R600 Localized edema: Secondary | ICD-10-CM

## 2017-10-02 LAB — POCT INR: INR: 2

## 2017-10-07 ENCOUNTER — Ambulatory Visit (INDEPENDENT_AMBULATORY_CARE_PROVIDER_SITE_OTHER): Payer: Medicare HMO

## 2017-10-07 VITALS — BP 140/80 | HR 83 | Ht 70.0 in | Wt 307.0 lb

## 2017-10-07 DIAGNOSIS — I10 Essential (primary) hypertension: Secondary | ICD-10-CM

## 2017-10-07 DIAGNOSIS — Z23 Encounter for immunization: Secondary | ICD-10-CM

## 2017-10-07 DIAGNOSIS — R7303 Prediabetes: Secondary | ICD-10-CM | POA: Diagnosis not present

## 2017-10-07 DIAGNOSIS — R160 Hepatomegaly, not elsewhere classified: Secondary | ICD-10-CM | POA: Diagnosis not present

## 2017-10-07 DIAGNOSIS — Z0000 Encounter for general adult medical examination without abnormal findings: Secondary | ICD-10-CM

## 2017-10-07 DIAGNOSIS — E7439 Other disorders of intestinal carbohydrate absorption: Secondary | ICD-10-CM

## 2017-10-07 NOTE — Patient Instructions (Addendum)
Nicolas [name] , Thank you for taking time to come for your Medicare Wellness Visit. I appreciate your ongoing commitment to your health goals. Please review the following plan we discussed and let me know if I can assist you in the future.   Screening recommendations/referrals: Colonoscopy: declined, will discuss other options with PCP Recommended yearly ophthalmology/optometry visit for glaucoma screening and checkup Recommended yearly dental visit for hygiene and checkup  Vaccinations: Influenza vaccine: administered today  Pneumococcal vaccine: Prevnar at age 50 Tdap vaccine: up to date, next due 06/18/2024 Shingles vaccine: Check with your pharmacy about receiving this vaccine   Advanced directives: Advance directive discussed with you today. Even though you declined this today please call our office should you change your mind and we can give you the proper paperwork for you to fill out.   Conditions/risks identified: Continue to be positive and work on hopefully being able to walk again someday.   Next appointment: 10/10/17 @ 2 pm with [name]   Preventive Care 40-64 Years, Male Preventive care refers to lifestyle choices and visits with your health care provider that can promote health and wellness. What does preventive care include?  A yearly physical exam. This is also called an annual well check.  Dental exams once or twice a year.  Routine eye exams. Ask your health care provider how often you should have your eyes checked.  Personal lifestyle choices, including:  Daily care of your teeth and gums.  Regular physical activity.  Eating a healthy diet.  Avoiding tobacco and drug use.  Limiting alcohol use.  Practicing safe sex.  Taking low-dose aspirin every day starting at age 76. What happens during an annual well check? The services and screenings done by your health care provider during your annual well check will depend on your age, overall health, lifestyle  risk factors, and family history of disease. Counseling  Your health care provider may ask you questions about your:  Alcohol use.  Tobacco use.  Drug use.  Emotional well-being.  Home and relationship well-being.  Sexual activity.  Eating habits.  Work and work Statistician. Screening  You may have the following tests or measurements:  Height, weight, and BMI.  Blood pressure.  Lipid and cholesterol levels. These may be checked every 5 years, or more frequently if you are over 69 years old.  Skin check.  Lung cancer screening. You may have this screening every year starting at age 53 if you have a 30-pack-year history of smoking and currently smoke or have quit within the past 15 years.  Fecal occult blood test (FOBT) of the stool. You may have this test every year starting at age 32.  Flexible sigmoidoscopy or colonoscopy. You may have a sigmoidoscopy every 5 years or a colonoscopy every 10 years starting at age 22.  Prostate cancer screening. Recommendations will vary depending on your family history and other risks.  Hepatitis C blood test.  Hepatitis B blood test.  Sexually transmitted disease (STD) testing.  Diabetes screening. This is done by checking your blood sugar (glucose) after you have not eaten for a while (fasting). You may have this done every 1-3 years. Discuss your test results, treatment options, and if necessary, the need for more tests with your health care provider. Vaccines  Your health care provider may recommend certain vaccines, such as:  Influenza vaccine. This is recommended every year.  Tetanus, diphtheria, and acellular pertussis (Tdap, Td) vaccine. You may need a Td booster every 10 years.  Zoster vaccine. You may need this after age 31.  Pneumococcal 13-valent conjugate (PCV13) vaccine. You may need this if you have certain conditions and have not been vaccinated.  Pneumococcal polysaccharide (PPSV23) vaccine. You may need one  or two doses if you smoke cigarettes or if you have certain conditions. Talk to your health care provider about which screenings and vaccines you need and how often you need them. This information is not intended to replace advice given to you by your health care provider. Make sure you discuss any questions you have with your health care provider. Document Released: 12/09/2015 Document Revised: 08/01/2016 Document Reviewed: 09/13/2015 Elsevier Interactive Patient Education  2017 [place] Prevention in the Home Falls can cause injuries. They can happen to people of all ages. There are many things you can do to make your home safe and to help prevent falls. What can I do on the outside of my home?  Regularly fix the edges of walkways and driveways and fix any cracks.  Remove anything that might make you trip as you walk through a door, such as a raised step or threshold.  Trim any bushes or trees on the path to your home.  Use bright outdoor lighting.  Clear any walking paths of anything that might make someone trip, such as rocks or tools.  Regularly check to see if handrails are loose or broken. Make sure that both sides of any steps have handrails.  Any raised decks and porches should have guardrails on the edges.  Have any leaves, snow, or ice cleared regularly.  Use sand or salt on walking paths during winter.  Clean up any spills in your garage right away. This includes oil or grease spills. What can I do in the bathroom?  Use night lights.  Install grab bars by the toilet and in the tub and shower. Do not use towel bars as grab bars.  Use non-skid mats or decals in the tub or shower.  If you need to sit down in the shower, use a plastic, non-slip stool.  Keep the floor dry. Clean up any water that spills on the floor as soon as it happens.  Remove soap buildup in the tub or shower regularly.  Attach bath mats securely with double-sided non-slip rug  tape.  Do not have throw rugs and other things on the floor that can make you trip. What can I do in the bedroom?  Use night lights.  Make sure that you have a light by your bed that is easy to reach.  Do not use any sheets or blankets that are too big for your bed. They should not hang down onto the floor.  Have a firm chair that has side arms. You can use this for support while you get dressed.  Do not have throw rugs and other things on the floor that can make you trip. What can I do in the kitchen?  Clean up any spills right away.  Avoid walking on wet floors.  Keep items that you use a lot in easy-to-reach places.  If you need to reach something above you, use a strong step stool that has a grab bar.  Keep electrical cords out of the way.  Do not use floor polish or wax that makes floors slippery. If you must use wax, use non-skid floor wax.  Do not have throw rugs and other things on the floor that can make you trip. What can I do with  my stairs?  Do not leave any items on the stairs.  Make sure that there are handrails on both sides of the stairs and use them. Fix handrails that are broken or loose. Make sure that handrails are as long as the stairways.  Check any carpeting to make sure that it is firmly attached to the stairs. Fix any carpet that is loose or worn.  Avoid having throw rugs at the top or bottom of the stairs. If you do have throw rugs, attach them to the floor with carpet tape.  Make sure that you have a light switch at the top of the stairs and the bottom of the stairs. If you do not have them, ask someone to add them for you. What else can I do to help prevent falls?  Wear shoes that:  Do not have high heels.  Have rubber bottoms.  Are comfortable and fit you well.  Are closed at the toe. Do not wear sandals.  If you use a stepladder:  Make sure that it is fully opened. Do not climb a closed stepladder.  Make sure that both sides of the  stepladder are locked into place.  Ask someone to hold it for you, if possible.  Clearly mark and make sure that you can see:  Any grab bars or handrails.  First and last steps.  Where the edge of each step is.  Use tools that help you move around (mobility aids) if they are needed. These include:  Canes.  Walkers.  Scooters.  Crutches.  Turn on the lights when you go into a dark area. Replace any light bulbs as soon as they burn out.  Set up your furniture so you have a clear path. Avoid moving your furniture around.  If any of your floors are uneven, fix them.  If there are any pets around you, be aware of where they are.  Review your medicines with your doctor. Some medicines can make you feel dizzy. This can increase your chance of falling. Ask your doctor what other things that you can do to help prevent falls. This information is not intended to replace advice given to you by your health care provider. Make sure you discuss any questions you have with your health care provider. Document Released: 09/08/2009 Document Revised: 04/19/2016 Document Reviewed: 12/17/2014 Elsevier Interactive Patient Education  2017 [employer].

## 2017-10-07 NOTE — Progress Notes (Signed)
Subjective:   Nicolas [name] is a 51 y.o. male who presents for Medicare Annual/Subsequent preventive examination.  Review of Systems:  N/A Cardiac Risk Factors include: hypertension;dyslipidemia;obesity (BMI >30kg/m2);male gender     Objective:    Vitals: BP 140/80   Pulse 83   Ht 5\' 10"  (1.778 m)   Wt (!) 307 lb (139.3 kg)   SpO2 100%   BMI 44.05 kg/m   Body mass index is 44.05 kg/m.  Tobacco Social History   Tobacco Use  Smoking Status Former Smoker  . Packs/day: 1.50  . Years: 10.00  . Pack years: 15.00  . Types: Cigarettes  . Last attempt to quit: 04/26/2001  . Years since quitting: 16.4  Smokeless Tobacco Never Used     Counseling given: Not Answered   Past Medical History:  Diagnosis Date  . Arthritis   . Clotting disorder ([place])   . Depression   . Diabetes mellitus without complication ([place])   . DVT (deep venous thrombosis) ([place])    Summer 2012  . History of home oxygen therapy    uses mouthpiece ventilation with 2 liters supplemental oxygen all the time  . Hypertension   . Neuromuscular disorder ([place])   . Pulmonary embolism ([place])    x 3 in 2005, 2008, 2010  . Sleep apnea   . Spinal stenosis    Past Surgical History:  Procedure Laterality Date  . ivp filter  jan 2012  . SPINE SURGERY  11/30/11   Total 6 back surgeries  . SPINE SURGERY  69/6789   Dr. Cathren Laine in [place], [place]   Family History  Problem Relation Age of Onset  . Cancer Mother 39       Lung  . Diabetes Father   . Prostate cancer Father   . Hypertension Father   . Hypertension Brother   . Hypertension Brother   . Diabetes Brother   . Heart attack Neg Hx   . Stroke Neg Hx    Social History   Substance and Sexual Activity  Sexual Activity No    Outpatient Encounter Medications as of 10/07/2017  Medication Sig  . furosemide (LASIX) 40 MG tablet Take 1 tablet (40 mg total) by mouth daily.  . metoprolol succinate (TOPROL-XL) 50 MG 24 hr tablet Take 1 tablet (50 mg  total) by mouth every evening. Take with or immediately following a meal.  . Multiple Vitamin (MULTIVITAMIN WITH MINERALS) TABS tablet Take 1 tablet by mouth daily. Reported on 11/09/2015  . potassium chloride SA (K-DUR,KLOR-CON) 20 MEQ tablet Take 1 tablet (20 mEq total) by mouth daily.  Marland Kitchen warfarin (COUMADIN) 10 MG tablet Take as directed by coumadin clinic   No facility-administered encounter medications on file as of 10/07/2017.     Activities of Daily Living In your present state of health, do you have any difficulty performing the following activities: 10/07/2017 02/12/2017  Hearing? N N  Vision? N N  Difficulty concentrating or making decisions? Y N  Comment Patient has issues with focusing on things at times.  -  Walking or climbing stairs? Y Y  Comment Patient is in a wheelchair and has unsteady gait.  -  Dressing or bathing? N N  Doing errands, shopping? N Y  Conservation officer, nature and eating ? N N  Using the Toilet? N N  In the past six months, have you accidently leaked urine? N N  Do you have problems with loss of bowel control? Y N  Comment Patient has issues  with loss of bowel control  -  Managing your Medications? N N  Managing your Finances? N N  Housekeeping or managing your Housekeeping? N N  Some recent data might be hidden    Patient Care Team: Shawnee Knapp, MD as PCP - General (Family Medicine)   Assessment:     Exercise Activities and Dietary recommendations Current Exercise Habits: Home exercise routine, Type of exercise: strength training/weights, Time (Minutes): 50, Frequency (Times/Week): 7, Weekly Exercise (Minutes/Week): 350, Intensity: Moderate, Exercise limited by: None identified  Goals    None     Fall Risk Fall Risk  10/07/2017 04/04/2017 02/12/2017 12/21/2016 11/22/2016  Falls in the past year? No No Yes Yes Yes  Comment - - last fall date 01/2016 last fall date 01/2016 Falls: 2 (12/2015) and (02/13/16)   Number falls in past yr: - - 2 or more 2 or more  2 or more  Comment - - - - -  Injury with Fall? - - No No No  Risk Factor Category  - - High Fall Risk High Fall Risk High Fall Risk  Risk for fall due to : - - History of fall(s);Impaired balance/gait;Impaired mobility History of fall(s);Impaired balance/gait;Impaired mobility;Other (Comment) History of fall(s);Impaired balance/gait;Impaired mobility  Risk for fall due to: Comment - - - W/C needs -  Follow up - - Falls evaluation completed;Education provided;Falls prevention discussed Falls evaluation completed;Education provided;Falls prevention discussed;Follow up appointment Falls evaluation completed;Education provided;Falls prevention discussed  Comment - - - appointment 12/27/2016 -   Depression Screen PHQ 2/9 Scores 10/07/2017 04/04/2017 02/12/2017 12/21/2016  PHQ - 2 Score 0 0 0 0    Cognitive Function     6CIT Screen 10/07/2017  What Year? 0 points  What month? 0 points  What time? 0 points  Count back from 20 0 points  Months in reverse 0 points  Repeat phrase 2 points  Total Score 2    Immunization History  Administered Date(s) Administered  . Influenza Split 08/29/2012  . Influenza,inj,Quad PF,6+ Mos 12/03/2013, 10/19/2014, 08/30/2015, 10/04/2016, 10/07/2017  . Pneumococcal Polysaccharide-23 11/26/2008, 10/04/2016  . Tdap 06/18/2014   Screening Tests Health Maintenance  Topic Date Due  . OPHTHALMOLOGY EXAM  12/12/1976  . FOOT EXAM  10/04/2017  . URINE MICROALBUMIN  10/04/2017  . HEMOGLOBIN A1C  10/05/2017  . COLONOSCOPY  10/07/2018 (Originally 12/12/2016)  . TETANUS/TDAP  06/18/2024  . PNEUMOCOCCAL POLYSACCHARIDE VACCINE  Completed  . INFLUENZA VACCINE  Completed  . HIV Screening  Completed      Plan:   I have personally reviewed and noted the following in the patient's chart:   . Medical and social history . Use of alcohol, tobacco or illicit drugs  . Current medications and supplements . Functional ability and status . Nutritional status . Physical  activity . Advanced directives . List of other physicians . Hospitalizations, surgeries, and ER visits in previous 12 months . Vitals . Screenings to include cognitive, depression, and falls . Referrals and appointments  In addition, I have reviewed and discussed with patient certain preventive protocols, quality metrics, and best practice recommendations. A written personalized care plan for preventive services as well as general preventive health recommendations were provided to patient.  Patient declined colonoscopy. Will talk to PCP about other options. 1. Essential hypertension - Lipid panel - Urinalysis, Complete  2. Glucose intolerance - Hemoglobin A1c - Microalbumin, urine  3. Encounter for Medicare annual wellness exam  4. Need for immunization against influenza - Flu Vaccine QUAD  6+ mos IM (Fluarix)   Andrez Grime, [place]  [id number]

## 2017-10-08 LAB — URINALYSIS, COMPLETE
Bilirubin, UA: NEGATIVE
Glucose, UA: NEGATIVE
Ketones, UA: NEGATIVE
Leukocytes, UA: NEGATIVE
Nitrite, UA: NEGATIVE
Protein, UA: NEGATIVE
RBC, UA: NEGATIVE
Specific Gravity, UA: 1.021 (ref 1.005–1.030)
Urobilinogen, Ur: 0.2 mg/dL (ref 0.2–1.0)
pH, UA: 6.5 (ref 5.0–7.5)

## 2017-10-08 LAB — MICROSCOPIC EXAMINATION
Bacteria, UA: NONE SEEN
Casts: NONE SEEN /lpf

## 2017-10-08 LAB — LIPID PANEL
Chol/HDL Ratio: 4.4 ratio (ref 0.0–5.0)
Cholesterol, Total: 181 mg/dL (ref 100–199)
HDL: 41 mg/dL (ref >39)
LDL Calculated: 94 mg/dL (ref 0–99)
Triglycerides: 231 mg/dL — ABNORMAL HIGH (ref 0–149)
VLDL Cholesterol Cal: 46 mg/dL — ABNORMAL HIGH (ref 5–40)

## 2017-10-08 LAB — HEMOGLOBIN A1C
Est. average glucose Bld gHb Est-mCnc: 117 mg/dL
Hgb A1c MFr Bld: 5.7 % — ABNORMAL HIGH (ref 4.8–5.6)

## 2017-10-08 LAB — MICROALBUMIN, URINE: Microalbumin, Urine: 3.2 ug/mL

## 2017-10-10 ENCOUNTER — Encounter: Payer: Self-pay | Admitting: Family Medicine

## 2017-10-10 ENCOUNTER — Ambulatory Visit: Payer: Medicare HMO | Admitting: Family Medicine

## 2017-10-10 ENCOUNTER — Other Ambulatory Visit: Payer: Self-pay

## 2017-10-10 ENCOUNTER — Other Ambulatory Visit: Payer: Self-pay | Admitting: Family Medicine

## 2017-10-10 VITALS — BP 122/78 | HR 90 | Temp 98.3°F | Resp 16 | Ht 70.0 in | Wt 309.0 lb

## 2017-10-10 DIAGNOSIS — R296 Repeated falls: Secondary | ICD-10-CM | POA: Diagnosis not present

## 2017-10-10 DIAGNOSIS — Z0000 Encounter for general adult medical examination without abnormal findings: Secondary | ICD-10-CM

## 2017-10-10 DIAGNOSIS — Z1211 Encounter for screening for malignant neoplasm of colon: Secondary | ICD-10-CM | POA: Diagnosis not present

## 2017-10-10 DIAGNOSIS — E8881 Metabolic syndrome: Secondary | ICD-10-CM

## 2017-10-10 DIAGNOSIS — I10 Essential (primary) hypertension: Secondary | ICD-10-CM | POA: Diagnosis not present

## 2017-10-10 DIAGNOSIS — R600 Localized edema: Secondary | ICD-10-CM

## 2017-10-10 DIAGNOSIS — Z6841 Body Mass Index (BMI) 40.0 and over, adult: Secondary | ICD-10-CM

## 2017-10-10 DIAGNOSIS — Z993 Dependence on wheelchair: Secondary | ICD-10-CM

## 2017-10-10 DIAGNOSIS — R7303 Prediabetes: Secondary | ICD-10-CM | POA: Diagnosis not present

## 2017-10-10 DIAGNOSIS — Z7901 Long term (current) use of anticoagulants: Secondary | ICD-10-CM | POA: Diagnosis not present

## 2017-10-10 DIAGNOSIS — Z1212 Encounter for screening for malignant neoplasm of rectum: Secondary | ICD-10-CM

## 2017-10-10 DIAGNOSIS — R5381 Other malaise: Secondary | ICD-10-CM | POA: Diagnosis not present

## 2017-10-10 DIAGNOSIS — M48062 Spinal stenosis, lumbar region with neurogenic claudication: Secondary | ICD-10-CM | POA: Diagnosis not present

## 2017-10-10 DIAGNOSIS — Z86711 Personal history of pulmonary embolism: Secondary | ICD-10-CM

## 2017-10-10 NOTE — Progress Notes (Signed)
Subjective:    Patient ID: Nicolas [name], male    DOB: May 28, 1966, 51 y.o.   MRN: [id number]   Chief Complaint  Patient presents with  . Annual Exam    HPI  Nicolas [name] is a 51 year old man here for a 6 month follow-up on his chronic medical conditions. I first met him at his annual wellness exam 6 months prior. I did see him for a mobility exam to get a new power wheelchair 4 months ago.  Primary Preventative Screenings: Prostate Cancer: PSA nml at 1.0 6 mos prior. STI screening: neg HIV 2016 Colorectal Cancer: none prior.  Tobacco use/AAA/Lung/EtOH/Illicit substances:  Started smoking at 51 yo - 1.5-2 ppd  Stopped at 51 yo Cardiac: cardiology - Dr. Angelena [name] - EKG 12/08/2015 Weight/Blood sugar/Diet/Exercise: BMI Readings from Last 3 Encounters:  10/07/17 44.05 kg/m  04/04/17 42.18 kg/m  02/12/17 44.05 kg/m   Lab Results  Component Value Date   HGBA1C 5.7 (H) 10/07/2017   OTC/Vit/Supp/Herbal: Dentist/Optho: [facility] Immunizations:  Immunization History  Administered Date(s) Administered  . Influenza Split 08/29/2012  . Influenza,inj,Quad PF,6+ Mos 12/03/2013, 10/19/2014, 08/30/2015, 10/04/2016, 10/07/2017  . Pneumococcal Polysaccharide-23 11/26/2008, 10/04/2016  . Tdap 06/18/2014    Chronic Medical Conditions: 1. DMII: used to be on metformin but he was able to come off of it and does not need to check sugars, does not know where meter is. Sees optho annually but not yet this yr - in [place] [facility]. Normal urine microalbumin 6 months prior.  Lab Results  Component Value Date   HGBA1C 5.7 (H) 10/07/2017   HGBA1C 5.7 (H) 04/04/2017   HGBA1C 5.5 10/04/2016   HGBA1C 5.9 (H) 08/30/2015   HGBA1C 6.0 (H) 03/22/2015    2. HTN: lasix 40 w/ K 20 prn for when he gets chest congestion but very rarely needs this and his pedal edema has improved over time, toprol 50 regluarly.  With him moving more, edea improving.  No orthostatic sxs. BP at home  runs 130/80. None >140/90   3. Obesity 4. H/o DVT/PE: on warfarin for anticoag, followed at cardiology.  Has an IVC filter left in place has had bilateral DVT and PEs -34 times of unknown etiology 5.  Debility - uses motorized scooter. Can ambulate short distances and transfer - first developed in 2004 and then ended up in a motorzied wheelchair around 2012.  He has had 7 back surgeries thoughout entire spinal cord.  He has neuropathy in all 4 extremities though legs > arm.  Numbness and weakness are worse in right leg and left arm. He is right-handed.   6.  Mood d/o -Slowly went off the zoloft 150 in March as he was doing well and no mood changes. Depression screen [facility] 2/9 10/07/2017 04/04/2017 02/12/2017 12/21/2016 11/22/2016  Decreased Interest 0 0 0 0 0  Down, Depressed, Hopeless 0 0 0 0 0  PHQ - 2 Score 0 0 0 0 0  Some recent data might be hidden    7.  Chronic pain with arthralgias and neuropathy: followed with PT and neurosurg Dr. Jonathon [name] in [place] out of [place].  He did his last 2 surgeries - most recent in 2014. On voltaren gel prn but uses rarely.  After about 10 min he feels good, then after 20 min his right side won't work. Gradually set in but has been pretty consistent over the past 3-4 mos.  8. osa - could not tolerate cpap so does not  have machine.  Feels like he is sleeping fine. He has to wake up to role over.   He has a medical bed and can lay it back further gradually as he gets use to it due to back pain. Decreased edema with amb so rarely needed lasix.   He has been steadily loosing weight since 378 lbs. He eats anyting but watches his quantity.  He is eating 3 meals of what used to be one meal. Not tryinhg. Occ dizzy/hot flash in the eveningsf or 5-10 min - dry mouth, feels clammy, he will drink water and it ill gradually go away.  Usually occurs around 10-11 at night.   Past Medical History:  Diagnosis Date  . Arthritis   . Clotting disorder ([place])   .  Depression   . Diabetes mellitus without complication ([place])   . DVT (deep venous thrombosis) ([place])    Summer 2012  . History of home oxygen therapy    uses mouthpiece ventilation with 2 liters supplemental oxygen all the time  . Hypertension   . Neuromuscular disorder ([place])   . Pulmonary embolism ([place])    x 3 in 2005, 2008, 2010  . Sleep apnea   . Spinal stenosis    Past Surgical History:  Procedure Laterality Date  . CHOLECYSTECTOMY N/A 05/24/2014   Procedure: LAPAROSCOPIC CHOLECYSTECTOMY;  Surgeon: Nicolas Jarred, MD;  Location: [facility];  Service: General;  Laterality: N/A;  . ERCP N/A 05/25/2014   Procedure: ENDOSCOPIC RETROGRADE CHOLANGIOPANCREATOGRAPHY (ERCP);  Surgeon: Nicolas Beams, MD;  Location: [facility];  Service: Endoscopy;  Laterality: N/A;  . ERCP N/A 07/16/2014   Procedure: ENDOSCOPIC RETROGRADE CHOLANGIOPANCREATOGRAPHY (ERCP);  Surgeon: Nicolas Beams, MD;  Location: [facility];  Service: Endoscopy;  Laterality: N/A;  . ivp filter  jan 2012  . SPINE SURGERY  11/30/11   Total 6 back surgeries  . SPINE SURGERY  [id number]   Dr. Cathren [name] in [place], [place]   Current Outpatient Medications on File Prior to Visit  Medication Sig Dispense Refill  . furosemide (LASIX) 40 MG tablet Take 1 tablet (40 mg total) by mouth daily. 90 tablet 3  . metoprolol succinate (TOPROL-XL) 50 MG 24 hr tablet Take 1 tablet (50 mg total) by mouth every evening. Take with or immediately following a meal. 90 tablet 3  . Multiple Vitamin (MULTIVITAMIN WITH MINERALS) TABS tablet Take 1 tablet by mouth daily. Reported on 11/09/2015    . potassium chloride SA (K-DUR,KLOR-CON) 20 MEQ tablet Take 1 tablet (20 mEq total) by mouth daily. 90 tablet 3  . warfarin (COUMADIN) 10 MG tablet Take as directed by coumadin clinic 90 tablet 0   No current facility-administered medications on file prior to visit.    Allergies  Allergen Reactions  . Lisinopril Other (See Comments)    cough  . Ace Inhibitors Other  (See Comments)    cough   Family History  Problem Relation Age of Onset  . Cancer Mother 56       Lung  . Diabetes Father   . Prostate cancer Father   . Hypertension Father   . Hypertension Brother   . Hypertension Brother   . Diabetes Brother   . Heart attack Neg Hx   . Stroke Neg Hx    Social History   Socioeconomic History  . Marital status: Single    Spouse name: Not on file  . Number of children: 3  . Years of education: 10  . Highest education level: 10th  grade  Social Needs  . Financial resource strain: Not hard at all  . Food insecurity - worry: Never true  . Food insecurity - inability: Never true  . Transportation needs - medical: No  . Transportation needs - non-medical: No  Occupational History  . Occupation: Disability  Tobacco Use  . Smoking status: Former Smoker    Packs/day: 1.50    Years: 10.00    Pack years: 15.00    Types: Cigarettes    Last attempt to quit: 04/26/2001    Years since quitting: 16.4  . Smokeless tobacco: Never Used  Substance and Sexual Activity  . Alcohol use: Yes    Alcohol/week: 0.5 oz    Types: 1 Standard drinks or equivalent per week    Comment: very little  . Drug use: No  . Sexual activity: No  Other Topics Concern  . Not on file  Social History Narrative   Patient is single and lives at home, his daughter lives with him.   Patient has three children.   Patient is disabled.   Patient has a 10 grade education.   Patient is right-handed.   Patient drinks 1 or 2 sodas and tea per week.   Depression screen [facility] 2/9 10/07/2017 04/04/2017 02/12/2017 12/21/2016 11/22/2016  Decreased Interest 0 0 0 0 0  Down, Depressed, Hopeless 0 0 0 0 0  PHQ - 2 Score 0 0 0 0 0  Some recent data might be hidden    Review of Systems See hpi    Objective:   Physical Exam  Constitutional: He is oriented to person, place, and time. He appears well-developed and well-nourished. No distress.  Use rolling walker with slow antalgic gait,  leaning forward  HENT:  Head: Normocephalic and atraumatic.  Eyes: Conjunctivae are normal. Pupils are equal, round, and reactive to light. No scleral icterus.  Neck: Normal range of motion. Neck supple. No thyromegaly present.  Cardiovascular: Normal rate, regular rhythm, normal heart sounds and intact distal pulses.  1+ pitting edema with thick hyperpigmented pitted skin on venous statis dermatitis on bilateral lower ext  Pulmonary/Chest: Effort normal and breath sounds normal. No respiratory distress.  Abdominal: Normal appearance and bowel sounds are normal. He exhibits distension. There is no tenderness.  diastsis recti Unable to assess organomegaly due to body habitus  Genitourinary: Rectum normal and prostate normal. Prostate is not enlarged and not tender.  Musculoskeletal: He exhibits no edema.  Lymphadenopathy:    He has no cervical adenopathy.  Neurological: He is alert and oriented to person, place, and time.  Skin: Skin is warm and dry. He is not diaphoretic.  Psychiatric: He has a normal mood and affect. His behavior is normal.   Trace pitting edema 1+ DP pulse Lt, trce on right   BP 122/78   Pulse 90   Temp 98.3 F (36.8 C)   Resp 16   Ht 5' 10" (1.778 m)   Wt (!) 309 lb (140.2 kg)   SpO2 98%   BMI 44.34 kg/m   Lab Results  Component Value Date   HGBA1C 5.7 (H) 10/07/2017   HGBA1C 5.7 (H) 04/04/2017   HGBA1C 5.5 10/04/2016   HGBA1C 5.9 (H) 08/30/2015   HGBA1C 6.0 (H) 03/22/2015    Assessment & Plan:  Ophtho done about 2 years ago., foot exam today. Colonoscopy,  Refer to Spine [facility] Scoil[facility] as long as on  Dr. Candis Musa?  1. Annual physical exam   2. Class 3 severe obesity due to  excess calories with serious comorbidity and body mass index (BMI) of 40.0 to 44.9 in adult ([place])   3. Frequent falls   4. Hx pulmonary embolism   5. Lower extremity edema   6. Spinal stenosis of lumbar region with neurogenic claudication   7. Chronic anticoagulation   8.  Essential hypertension   9. Prediabetes   10. Screening for colorectal cancer   11. Debility   12. Able to mobilize using indoor motorized wheelchair   13. Metabolic syndrome     Orders Placed This Encounter  Procedures  . Comprehensive metabolic panel  . Ambulatory referral to Gastroenterology    Referral Priority:   Routine    Referral Type:   Consultation    Referral Reason:   Specialty Services Required    Number of Visits Requested:   1  . HM DIABETES FOOT EXAM    [name], M.D.  Primary Care at [facility] [address] [place], [place] [address] [phone] phone [phone] fax  10/10/17 1:54 PM

## 2017-10-10 NOTE — Patient Instructions (Addendum)
IF you received an x-ray today, you will receive an invoice from [facility]. Please contact [facility] at [phone] with questions or concerns regarding your invoice.   IF you received labwork today, you will receive an invoice from [facility]. Please contact [facility] at [phone] with questions or concerns regarding your invoice.   Our billing staff will not be able to assist you with questions regarding bills from these companies.  You will be contacted with the lab results as soon as they are available. The fastest way to get your results is to activate your My Chart account. Instructions are located on the last page of this paperwork. If you have not heard from [place] regarding the results in 2 weeks, please contact this office.      Metabolic Syndrome Metabolic syndrome is the presence of at least three factors that increase your risk of getting cardiovascular disease and diabetes. These factors are:  High blood sugar.  High blood triglyceride level.  High blood pressure.  Low levels of good blood cholesterol (high-density lipoprotein or HDL).  Excess weight around the waist. This factor is present with a waist measurement of: ? More than 40 inches in men. ? More than 35 inches in women.  Metabolic syndrome is sometimes called insulin resistance syndrome and syndrome X. What are the causes? The exact cause is not known, but genetics and lifestyle choices play a role. What increases the risk? You are more likely to develop metabolic syndrome if:  You eat a diet high in calories and saturated fat.  You do not exercise regularly.  You are overweight.  You have a family history of metabolic syndrome.  You are Asian.  You are older in age.  You have insulin resistance.  You use any tobacco products, including cigarettes, chewing tobacco, or electronic cigarettes.  What are the signs or symptoms? Metabolic syndrome has no specific  symptoms. How is this diagnosed? To make a diagnosis, your health care provider will determine whether you have at least three of the factors that make up metabolic syndrome by:  Taking your blood pressure.  Measuring your waist.  Ordering blood tests.  How is this treated? Treatment may include:  Lifestyle changes to reduce your risk for heart disease and stroke, such as: ? Exercise. ? Weight loss. ? Maintaining a healthy diet. ? Quitting the use of any tobacco products, including cigarettes, chewing tobacco, or electronic cigarettes.  Medicines that: ? Help your body to maintain glucose control. ? Reduce your blood pressure and your blood triglyceride levels.  Follow these instructions at home:  Exercise regularly.  Maintain a healthy diet.  Do not use any tobacco products, including cigarettes, chewing tobacco, or electronic cigarettes. If you need help quitting, ask your health care provider.  Keep all follow-up visits as directed by your health care provider. This is important.  Measure your waist regularly and record the measurement. To measure your waist: ? Stand up straight. ? Breathe out. ? Wrap the measuring tape around the part of your waist that is just above your hipbones. ? Read the measurement. Contact a health care provider if:  You feel very tired.  You develop excessive thirst.  You pass large quantities of urine.  You put on weight around your waist.  You have headaches over and over again.  You have a dizzy spell. Get help right away if:  You develop sudden blurred vision.  You develop a sudden dizzy spell.  You have sudden  trouble speaking or swallowing.  You have sudden weakness in your arm or leg.  You have chest pains or trouble breathing.  You feel like your heartbeat is abnormal.  You faint. This information is not intended to replace advice given to you by your health care provider. Make sure you discuss any questions you  have with your health care provider. Document Released: 02/19/2008 Document Revised: 04/19/2016 Document Reviewed: 06/18/2014 Elsevier Interactive Patient Education  2018 [employer].  Diet for Metabolic Syndrome Metabolic syndrome is a disorder that includes at least three of these conditions:  Abdominal obesity.  Too much sugar in your blood.  High blood pressure.  Higher than normal amount of fat (lipids) in your blood.  Lower than normal level of "good" cholesterol (HDL).  Following a healthy diet can help to keep metabolic syndrome under control. It can also help to prevent the development of conditions that are associated with metabolic syndrome, such as diabetes, heart disease, and stroke. Along with exercise, a healthy diet:  Helps to improve the way that the body uses insulin.  Promotes weight loss. A common goal for people with this condition is to lose at least 7 to 10 percent of their starting weight.  What do I need to know about this diet?  Use the glycemic index (GI) to plan your meals. The index tells you how quickly a food will raise your blood sugar. Choose foods that have low GI values. These foods take a longer time to raise blood sugar.  Keep track of how many calories you take in. Eating the right amount of calories will help your achieve a healthy weight.  You may want to follow a Mediterranean diet. This diet includes lots of vegetables, lean meats or fish, whole grains, fruits, and healthy oils and fats. What foods can I eat? Grains Stone-ground whole wheat. Pumpernickel bread. Whole-grain bread, crackers, tortillas, cereal, and pasta. Unsweetened oatmeal.Bulgur.Barley.Quinoa.Brown rice or wild rice. Vegetables Lettuce. Spinach. Peas. Beets. Cauliflower. Cabbage. Broccoli. Carrots. Tomatoes. Squash. Eggplant. Herbs. Peppers. Onions. Cucumbers. Brussels sprouts. Sweet potatoes. Yams. Beans. Lentils. Fruits Berries. Apples. Oranges. Grapes. Mango.  Pomegranate. Kiwi. Cherries. Meats and Other Protein Sources Seafood and shellfish. Lean meats.Poultry. Tofu. Dairy Low-fat or fat-free dairy products, such as milk, yogurt, and cheese. Beverages Water. Low-fat milk. Milk alternatives, like soy milk or almond milk. Real fruit juice. Condiments Low-sugar or sugar-free ketchup, barbecue sauce, and mayonnaise. Mustard. Relish. Fats and Oils Avocado. Canola or olive oil. Nuts and nut butters.Seeds. The items listed above may not be a complete list of recommended foods or beverages. Contact your dietitian for more options. What foods are not recommended? Red meat. Palm oil and coconut oil. Processed foods. Fried foods. Alcohol. Sweetened drinks, such as iced tea and soda. Sweets. Salty foods. The items listed above may not be a complete list of foods and beverages to avoid. Contact your dietitian for more information. This information is not intended to replace advice given to you by your health care provider. Make sure you discuss any questions you have with your health care provider. Document Released: 03/29/2015 Document Revised: 03/23/2016 Document Reviewed: 11/24/2014 Elsevier Interactive Patient Education  2018 [place] Maintenance, Male A healthy lifestyle and preventive care is important for your health and wellness. Ask your health care provider about what schedule of regular examinations is right for you. What should I know about weight and diet? Eat a Healthy Diet  Eat plenty of vegetables, fruits, whole grains, low-fat dairy  products, and lean protein.  Do not eat a lot of foods high in solid fats, added sugars, or salt.  Maintain a Healthy Weight Regular exercise can help you achieve or maintain a healthy weight. You should:  Do at least 150 minutes of exercise each week. The exercise should increase your heart rate and make you sweat (moderate-intensity exercise).  Do strength-training exercises at least  twice a week.  Watch Your Levels of Cholesterol and Blood Lipids  Have your blood tested for lipids and cholesterol every 5 years starting at 50 years of age. If you are at high risk for heart disease, you should start having your blood tested when you are 50 years old. You may need to have your cholesterol levels checked more often if: ? Your lipid or cholesterol levels are high. ? You are older than 50 years of age. ? You are at high risk for heart disease.  What should I know about cancer screening? Many types of cancers can be detected early and may often be prevented. Lung Cancer  You should be screened every year for lung cancer if: ? You are a current smoker who has smoked for at least 30 years. ? You are a former smoker who has quit within the past 15 years.  Talk to your health care provider about your screening options, when you should start screening, and how often you should be screened.  Colorectal Cancer  Routine colorectal cancer screening usually begins at 50 years of age and should be repeated every 5-10 years until you are 50 years old. You may need to be screened more often if early forms of precancerous polyps or small growths are found. Your health care provider may recommend screening at an earlier age if you have risk factors for colon cancer.  Your health care provider may recommend using home test kits to check for hidden blood in the stool.  A small camera at the end of a tube can be used to examine your colon (sigmoidoscopy or colonoscopy). This checks for the earliest forms of colorectal cancer.  Prostate and Testicular Cancer  Depending on your age and overall health, your health care provider may do certain tests to screen for prostate and testicular cancer.  Talk to your health care provider about any symptoms or concerns you have about testicular or prostate cancer.  Skin Cancer  Check your skin from head to toe regularly.  Tell your health care  provider about any new moles or changes in moles, especially if: ? There is a change in a mole's size, shape, or color. ? You have a mole that is larger than a pencil eraser.  Always use sunscreen. Apply sunscreen liberally and repeat throughout the day.  Protect yourself by wearing long sleeves, pants, a wide-brimmed hat, and sunglasses when outside.  What should I know about heart disease, diabetes, and high blood pressure?  If you are 46-13 years of age, have your blood pressure checked every 3-5 years. If you are 64 years of age or older, have your blood pressure checked every year. You should have your blood pressure measured twice-once when you are at a hospital or clinic, and once when you are not at a hospital or clinic. Record the average of the two measurements. To check your blood pressure when you are not at a hospital or clinic, you can use: ? An automated blood pressure machine at a pharmacy. ? A home blood pressure monitor.  Talk to your health  care provider about your target blood pressure.  If you are between 50-14 years old, ask your health care provider if you should take aspirin to prevent heart disease.  Have regular diabetes screenings by checking your fasting blood sugar level. ? If you are at a normal weight and have a low risk for diabetes, have this test once every three years after the age of 57. ? If you are overweight and have a high risk for diabetes, consider being tested at a younger age or more often.  A one-time screening for abdominal aortic aneurysm (AAA) by ultrasound is recommended for men aged 23-75 years who are current or former smokers. What should I know about preventing infection? Hepatitis B If you have a higher risk for hepatitis B, you should be screened for this virus. Talk with your health care provider to find out if you are at risk for hepatitis B infection. Hepatitis C Blood testing is recommended for:  Everyone born from [birth year] through  1965.  Anyone with known risk factors for hepatitis C.  Sexually Transmitted Diseases (STDs)  You should be screened each year for STDs including gonorrhea and chlamydia if: ? You are sexually active and are younger than 50 years of age. ? You are older than 50 years of age and your health care provider tells you that you are at risk for this type of infection. ? Your sexual activity has changed since you were last screened and you are at an increased risk for chlamydia or gonorrhea. Ask your health care provider if you are at risk.  Talk with your health care provider about whether you are at high risk of being infected with HIV. Your health care provider may recommend a prescription medicine to help prevent HIV infection.  What else can I do?  Schedule regular health, dental, and eye exams.  Stay current with your vaccines (immunizations).  Do not use any tobacco products, such as cigarettes, chewing tobacco, and e-cigarettes. If you need help quitting, ask your health care provider.  Limit alcohol intake to no more than 2 drinks per day. One drink equals 12 ounces of beer, 5 ounces of wine, or 1 ounces of hard liquor.  Do not use street drugs.  Do not share needles.  Ask your health care provider for help if you need support or information about quitting drugs.  Tell your health care provider if you often feel depressed.  Tell your health care provider if you have ever been abused or do not feel safe at home. This information is not intended to replace advice given to you by your health care provider. Make sure you discuss any questions you have with your health care provider. Document Released: 05/10/2008 Document Revised: 07/11/2016 Document Reviewed: 08/16/2015 Elsevier Interactive Patient Education  [employer].

## 2017-10-11 ENCOUNTER — Encounter: Payer: Self-pay | Admitting: Nurse Practitioner

## 2017-10-12 LAB — COMPREHENSIVE METABOLIC PANEL
ALT: 19 IU/L (ref 0–44)
AST: 16 IU/L (ref 0–40)
Albumin/Globulin Ratio: 1.3 (ref 1.2–2.2)
Albumin: 4.1 g/dL (ref 3.5–5.5)
Alkaline Phosphatase: 65 IU/L (ref 39–117)
BUN/Creatinine Ratio: 17 (ref 9–20)
BUN: 13 mg/dL (ref 6–24)
Bilirubin Total: 0.4 mg/dL (ref 0.0–1.2)
CO2: 23 mmol/L (ref 20–29)
Calcium: 9.7 mg/dL (ref 8.7–10.2)
Chloride: 101 mmol/L (ref 96–106)
Creatinine, Ser: 0.78 mg/dL (ref 0.76–1.27)
GFR calc Af Amer: 122 mL/min/{1.73_m2} (ref >59)
GFR calc non Af Amer: 105 mL/min/{1.73_m2} (ref >59)
Globulin, Total: 3.1 g/dL (ref 1.5–4.5)
Glucose: 67 mg/dL (ref 65–99)
Potassium: 4.2 mmol/L (ref 3.5–5.2)
Sodium: 142 mmol/L (ref 134–144)
Total Protein: 7.2 g/dL (ref 6.0–8.5)

## 2017-10-12 LAB — SPECIMEN STATUS REPORT

## 2017-10-17 DIAGNOSIS — E0840 Diabetes mellitus due to underlying condition with diabetic neuropathy, unspecified: Secondary | ICD-10-CM | POA: Diagnosis not present

## 2017-10-17 DIAGNOSIS — M4800 Spinal stenosis, site unspecified: Secondary | ICD-10-CM | POA: Diagnosis not present

## 2017-10-23 ENCOUNTER — Ambulatory Visit (INDEPENDENT_AMBULATORY_CARE_PROVIDER_SITE_OTHER): Payer: Medicare HMO

## 2017-10-23 DIAGNOSIS — Z86711 Personal history of pulmonary embolism: Secondary | ICD-10-CM | POA: Diagnosis not present

## 2017-10-23 DIAGNOSIS — R600 Localized edema: Secondary | ICD-10-CM

## 2017-10-23 DIAGNOSIS — Z7901 Long term (current) use of anticoagulants: Secondary | ICD-10-CM | POA: Diagnosis not present

## 2017-10-23 LAB — POCT INR: INR: 2

## 2017-10-23 NOTE — Patient Instructions (Signed)
Continue taking same dosage of 10mg  (1 tablet) daily except 5mg  (1/2 tablet) on Mondays, Wednesdays, and Fridays.  Recheck in 4 weeks. Call us with any changes # [phone].

## 2017-10-24 ENCOUNTER — Other Ambulatory Visit: Payer: Self-pay | Admitting: *Deleted

## 2017-10-24 MED ORDER — WARFARIN SODIUM 10 MG PO TABS: ORAL_TABLET | ORAL | 0 refills | Status: DC

## 2017-10-29 ENCOUNTER — Ambulatory Visit (INDEPENDENT_AMBULATORY_CARE_PROVIDER_SITE_OTHER): Payer: Medicare HMO | Admitting: Nurse Practitioner

## 2017-10-29 ENCOUNTER — Encounter: Payer: Self-pay | Admitting: Nurse Practitioner

## 2017-10-29 ENCOUNTER — Telehealth: Payer: Self-pay

## 2017-10-29 VITALS — BP 130/66 | HR 70 | Ht 70.0 in | Wt 312.0 lb

## 2017-10-29 DIAGNOSIS — R194 Change in bowel habit: Secondary | ICD-10-CM | POA: Diagnosis not present

## 2017-10-29 DIAGNOSIS — Z7901 Long term (current) use of anticoagulants: Secondary | ICD-10-CM

## 2017-10-29 DIAGNOSIS — Z1211 Encounter for screening for malignant neoplasm of colon: Secondary | ICD-10-CM

## 2017-10-29 MED ORDER — NA SULFATE-K SULFATE-MG SULF 17.5-3.13-1.6 GM/177ML PO SOLN: ORAL | 0 refills | Status: DC

## 2017-10-29 NOTE — Telephone Encounter (Signed)
Pt takes Coumadin for recurrent DVT and PE. He will require a Lovenox bridge in order to hold his Coumadin for 5 days prior to his colonoscopy. Will schedule bridge in clinic in the next few days since procedure is next week. Clearance routed to [facility] [place].

## 2017-10-29 NOTE — Progress Notes (Addendum)
Chief Complaint:  Colon cancer screening  HPI: Patient is a 51 year old male referred by PCP [name], M.D. for colon cancer screening. Patient has a history of grade 2 diastolic heart failure, hypertension as well as spinal stenosis and is status post several back surgeries. He is in a wheelchair but says he can ambulate short distances with a walker . He gives a history of a clotting disorder and is chronically anticoagulated for history of DVTs and PE. He has an IVC filter, maintained on Coumadin. Patient has not had any blood in his stool. He has no abdominal pain or unexplained weight loss. He has had alternating bowel habits over the last few months however. His stools are frequently hard, other times loose. No family history of colon cancer.  Patient has a history of acid reflux. He has occasional heartburn but more frequently has regurgitation of bitter contents. He has frequent belching. He takes generic Tums which help. No dysphagia.  Past Medical History:  Diagnosis Date  . Arthritis   . Clotting disorder ([place])   . Depression   . Diabetes mellitus without complication ([place])   . DVT (deep venous thrombosis) ([place])    Summer 2012  . History of home oxygen therapy    uses mouthpiece ventilation with 2 liters supplemental oxygen all the time  . Hypertension   . Neuromuscular disorder ([place])   . Pulmonary embolism ([place])    x 3 in 2005, 2008, 2010  . Sleep apnea   . Spinal stenosis      Past Surgical History:  Procedure Laterality Date  . CHOLECYSTECTOMY N/A 05/24/2014   Procedure: LAPAROSCOPIC CHOLECYSTECTOMY;  Surgeon: Zenovia Jarred, MD;  Location: [facility];  Service: General;  Laterality: N/A;  . ERCP N/A 05/25/2014   Procedure: ENDOSCOPIC RETROGRADE CHOLANGIOPANCREATOGRAPHY (ERCP);  Surgeon: Beryle Beams, MD;  Location: [facility];  Service: Endoscopy;  Laterality: N/A;  . ERCP N/A 07/16/2014   Procedure: ENDOSCOPIC RETROGRADE CHOLANGIOPANCREATOGRAPHY (ERCP);  Surgeon:  Beryle Beams, MD;  Location: [facility];  Service: Endoscopy;  Laterality: N/A;  . ivp filter  jan 2012  . SPINE SURGERY  11/30/11   Total 6 back surgeries  . SPINE SURGERY  73/2202   Dr. Cathren Laine in [place], [place]   Family History  Problem Relation Age of Onset  . Cancer Mother 93       Lung  . Diabetes Father   . Prostate cancer Father   . Hypertension Father   . Hypertension Brother   . Hypertension Brother   . Diabetes Brother   . Heart attack Neg Hx   . Stroke Neg Hx    Social History   Tobacco Use  . Smoking status: Former Smoker    Packs/day: 1.50    Years: 10.00    Pack years: 15.00    Types: Cigarettes    Last attempt to quit: 04/26/2001    Years since quitting: 16.5  . Smokeless tobacco: Never Used  Substance Use Topics  . Alcohol use: Yes    Alcohol/week: 0.5 oz    Types: 1 Standard drinks or equivalent per week    Comment: very little  . Drug use: No   Current Outpatient Medications  Medication Sig Dispense Refill  . furosemide (LASIX) 40 MG tablet Take 1 tablet (40 mg total) by mouth daily. 90 tablet 3  . metoprolol succinate (TOPROL-XL) 50 MG 24 hr tablet TAKE 1 TABLET EVERY EVENING. TAKE WITH OR IMMEDIATELY FOLLOWING A MEAL 90 tablet  3  . Multiple Vitamin (MULTIVITAMIN WITH MINERALS) TABS tablet Take 1 tablet by mouth daily. Reported on 11/09/2015    . potassium chloride SA (K-DUR,KLOR-CON) 20 MEQ tablet Take 1 tablet (20 mEq total) by mouth daily. 90 tablet 3  . warfarin (COUMADIN) 10 MG tablet Take as directed by coumadin clinic 90 tablet 0   No current facility-administered medications for this visit.    Allergies  Allergen Reactions  . Lisinopril Other (See Comments)    cough  . Ace Inhibitors Other (See Comments)    cough     Review of Systems: Positive for arthritis, back pain, fatigue, muscle pain and cramps, swelling of feet and legs and urine leakage All other systems reviewed and negative except where noted in HPI.    Physical  Exam: Ht 5\' 10"  (1.778 m)   Wt (!) 312 lb (141.5 kg)   BMI 44.77 kg/m  Constitutional:  Well-developed, black male in no acute distress. Psychiatric: Very pleasant. Normal mood and affect. Behavior is normal. EENT: Pupils normal.  Conjunctivae are normal. No scleral icterus. Neck supple.  Cardiovascular: Normal rate, regular rhythm. Pulmonary/chest: Effort normal and breath sounds normal. No wheezing, rales or rhonchi. Abdominal: Limited exam, in wheelchair. Abdomen is soft, nondistended. Nontender. Bowel sounds active throughout.  Neurological: Alert and oriented to person place and time. Skin: Skin is warm and dry. No rashes noted.   ASSESSMENT AND PLAN:  37. 51 year old male referred for first colon cancer screening. He has had some bowel changes over the last few months consisting of hard stools alternating with loose stools. No visible blood in his stool. No hx of anemia. No unusual weight loss or abdominal pain. No family history of colon cancer.  -Patient will be scheduled for a screening colonoscopy. He is able to ambulate short distances with walker so will schedule the procedure to be done at the [facility]. The risks and benefits of the procedure were discussed and the patient agrees to proceed. He will need to be off coumadin for the colonoscopy, see #2.   2. Hx of DVT / PE, s/p IVC,  on chronic coumadin. Apparently has a clotting disorder.  -Hold coumadin 5 days before procedure - will instruct when and how to resume after procedure. Patient understands that there is a  risk of recurrent blood clots while off coumadin and he agrees to proceed. He may need to be on a lovenox bridge while off coumadin, he has done this before. Will defer need for anticoagulation bridge to prescribing provider. Will communicate by phone or EMR with patient's prescribing provider to confirm that holding coumadin is reasonable in this case.   3. Spinal stenosis, s/p multiple back surgeries. In wheelchair but  able to ambulate short distances with walker.   4. HTN. BP controlled, 130/66 today.   5. Obesity. BMI 44  6. GERD. Regurgitation and occasional heartburn. No dysphagia. Sx relieved with Tums.   7. Hx of cystic duct bile leak following cholecystectomy 2015. Dr. Benson Norway performed an ERCP with stent placement June 2015. Stent removed Aug 2015. Marland Kitchen     Tye Savoy, NP  10/29/2017, 1:37 PM  Cc: Shawnee Knapp, MD   Addendum: Reviewed and agree with initial management. Pyrtle, Lajuan Lines, MD

## 2017-10-29 NOTE — Patient Instructions (Signed)
If you are age 50 or older, your body mass index should be between 23-30. Your Body mass index is 44.77 kg/m. If this is out of the aforementioned range listed, please consider follow up with your Primary Care Provider.  If you are age 5 or younger, your body mass index should be between 19-25. Your Body mass index is 44.77 kg/m. If this is out of the aformentioned range listed, please consider follow up with your Primary Care Provider.   You have been scheduled for a colonoscopy. Please follow written instructions given to you at your visit today.  Please pick up your prep supplies at the pharmacy within the next 1-3 days. If you use inhalers (even only as needed), please bring them with you on the day of your procedure. Your physician has requested that you go to www.startemmi.com and enter the access code given to you at your visit today. This web site gives a general overview about your procedure. However, you should still follow specific instructions given to you by our office regarding your preparation for the procedure.  We have sent the following medications to your pharmacy for you to pick up at your convenience: [place] will be contacted by our office prior to your procedure for directions on holding your Coumadin.  If you do not hear from our office 1 week prior to your scheduled procedure, please call [phone] to discuss.   Thank you for choosing me and [facility].   Tye Savoy, NP

## 2017-10-29 NOTE — Telephone Encounter (Signed)
Spoke with patient regarding holding Coumadin 5 days prior to colonoscopy procedure; he will need Lovenox bridge in order to hold for 5 days.  Patient states he just got off the phone with Coumadin clinic.  The patient verbalized understanding.

## 2017-10-29 NOTE — Telephone Encounter (Signed)
[facility] [address] [place], [place]  [address] Phone:  [phone]   Fax:  [phone]  10/29/2017   RE:      SHARAD VANEATON DOB:   [birth year] MRN:   [id number]   Dear Dr. Julianne Handler,    We have scheduled the above patient for an endoscopic procedure. Our records show that he is on anticoagulation therapy.   Please advise as to how long the patient may come off his therapy of Coumadin prior to the colonoscopy procedure, which is scheduled for 11/07/17.  Please fax back/ or route the completed form to [facility] at [phone].   Sincerely,    Thurmon Fair, RMA

## 2017-10-31 ENCOUNTER — Ambulatory Visit (INDEPENDENT_AMBULATORY_CARE_PROVIDER_SITE_OTHER): Payer: Medicare HMO | Admitting: *Deleted

## 2017-10-31 DIAGNOSIS — Z5181 Encounter for therapeutic drug level monitoring: Secondary | ICD-10-CM | POA: Diagnosis not present

## 2017-10-31 DIAGNOSIS — Z86711 Personal history of pulmonary embolism: Secondary | ICD-10-CM | POA: Diagnosis not present

## 2017-10-31 DIAGNOSIS — Z7901 Long term (current) use of anticoagulants: Secondary | ICD-10-CM | POA: Diagnosis not present

## 2017-10-31 DIAGNOSIS — R600 Localized edema: Secondary | ICD-10-CM

## 2017-10-31 LAB — POCT INR: INR: 1.6

## 2017-10-31 MED ORDER — ENOXAPARIN SODIUM 150 MG/ML ~~LOC~~ SOLN: 150.0000 mg | Freq: Two times a day (BID) | SUBCUTANEOUS | 0 refills | Status: DC

## 2017-10-31 NOTE — Patient Instructions (Addendum)
When get home today take another 1/2 tablet (5mg ) as he has taken 10 mg today then continue taking same dosage of 10mg  (1 tablet) daily except 5mg  (1/2 tablet) on Mondays, Wednesdays, and Fridays. Last day to take coumadin is Friday Dec 7th See Pt instruction sheet for Lovenox instructions Recheck in 1 week. Call us with any changes # [phone].     11/01/2017: Last dose of Coumadin.  11/02/2017 : No Coumadin or Lovenox.  11/03/2017 : Inject Lovenox 150 mg in the fatty abdominal tissue at least 2 inches from the belly button twice a day about 12 hours apart, 8am and 8pm rotate sites. No Coumadin.  11/04/2017 : Inject Lovenox in the fatty tissue every 12 hours, 8am and 8pm. No Coumadin.  11/05/2017: Inject Lovenox in the fatty tissue every 12 hours, 8am and 8pm. No Coumadin.  11/06/2017: Inject Lovenox in the fatty tissue in the morning at 8 am (No PM dose). No Coumadin.  11/07/2017: Procedure Day - No Lovenox - Resume Coumadin in the evening or as directed by doctor (take an extra half tablet with usual dose for 2 days then resume normal dose).  11/08/2017 : Resume Lovenox inject in the fatty tissue every 12 hours and take Coumadin.  11/09/2017: Inject Lovenox in the fatty tissue every 12 hours and take Coumadin.  11/10/2017: Inject Lovenox in the fatty tissue every 12 hours and take Coumadin.  11/11/2017: Inject Lovenox in the fatty tissue every 12 hours and take Coumadin.  11/12/2017: Inject Lovenox in the fatty tissue every 12 hours and take Coumadin.  11/13/2017: Coumadin appt to check INR. Call coumadin clinic with any questions cncerns or if placed on any new medications  [phone]

## 2017-11-01 ENCOUNTER — Other Ambulatory Visit: Payer: Self-pay | Admitting: Family Medicine

## 2017-11-01 NOTE — Addendum Note (Signed)
Addended by: Jerene Bears on: 11/01/2017 04:49 PM   Modules accepted: Level of Service

## 2017-11-07 ENCOUNTER — Encounter: Payer: Self-pay | Admitting: Internal Medicine

## 2017-11-07 ENCOUNTER — Ambulatory Visit (AMBULATORY_SURGERY_CENTER): Payer: Medicare HMO | Admitting: Internal Medicine

## 2017-11-07 VITALS — BP 126/77 | HR 86 | Temp 99.3°F | Resp 16 | Ht 70.0 in | Wt 312.0 lb

## 2017-11-07 DIAGNOSIS — D124 Benign neoplasm of descending colon: Secondary | ICD-10-CM | POA: Diagnosis not present

## 2017-11-07 DIAGNOSIS — D120 Benign neoplasm of cecum: Secondary | ICD-10-CM

## 2017-11-07 DIAGNOSIS — G4733 Obstructive sleep apnea (adult) (pediatric): Secondary | ICD-10-CM | POA: Diagnosis not present

## 2017-11-07 DIAGNOSIS — Z1211 Encounter for screening for malignant neoplasm of colon: Secondary | ICD-10-CM

## 2017-11-07 DIAGNOSIS — I10 Essential (primary) hypertension: Secondary | ICD-10-CM | POA: Diagnosis not present

## 2017-11-07 MED ORDER — SODIUM CHLORIDE 0.9 % IV SOLN: 500.0000 mL | INTRAVENOUS | Status: DC

## 2017-11-07 NOTE — Progress Notes (Signed)
Called to room to assist during endoscopic procedure.  Patient ID and intended procedure confirmed with present staff. Received instructions for my participation in the procedure from the performing physician.  

## 2017-11-07 NOTE — Op Note (Signed)
[facility] Patient Name: Nicolas [name] Procedure Date: 11/07/2017 2:57 PM MRN: [id number] Endoscopist: Jerene Bears , MD Age: 51 Referring MD:  Date of Birth: September 10, 1966 Gender: Male Account #: [id number] Procedure:                Colonoscopy Indications:              Screening for colorectal malignant neoplasm, This                            is the patient's first colonoscopy Medicines:                Monitored Anesthesia Care Procedure:                Pre-Anesthesia Assessment:                           - Prior to the procedure, a History and Physical                            was performed, and patient medications and                            allergies were reviewed. The patient's tolerance of                            previous anesthesia was also reviewed. The risks                            and benefits of the procedure and the sedation                            options and risks were discussed with the patient.                            All questions were answered, and informed consent                            was obtained. Prior Anticoagulants: The patient has                            taken Coumadin (warfarin), last dose was 5 days                            prior to procedure. ASA Grade Assessment: III - A                            patient with severe systemic disease. After                            reviewing the risks and benefits, the patient was                            deemed in satisfactory condition to undergo the  procedure.                           After obtaining informed consent, the colonoscope                            was passed under direct vision. Throughout the                            procedure, the patient's blood pressure, pulse, and                            oxygen saturations were monitored continuously. The                            Colonoscope was introduced through the anus and              advanced to the the terminal ileum. The colonoscopy                            was performed without difficulty. The patient                            tolerated the procedure well. The quality of the                            bowel preparation was good. The terminal ileum,                            ileocecal valve, appendiceal orifice, and rectum                            were photographed. Scope In: 3:04:45 PM Scope Out: 3:24:18 PM Scope Withdrawal Time: 0 hours 14 minutes 38 seconds  Total Procedure Duration: 0 hours 19 minutes 33 seconds  Findings:                 The digital rectal exam was normal.                           A 5 mm polyp was found in the cecum. The polyp was                            sessile. The polyp was removed with a cold snare.                            Resection and retrieval were complete.                           A 5 mm polyp was found in the descending colon. The                            polyp was sessile. The polyp was removed with a  cold snare. Resection and retrieval were complete.                           Internal hemorrhoids were found during                            retroflexion. The hemorrhoids were small.                           The exam was otherwise without abnormality. Complications:            No immediate complications. Estimated Blood Loss:     Estimated blood loss was minimal. Impression:               - One 5 mm polyp in the cecum, removed with a cold                            snare. Resected and retrieved.                           - One 5 mm polyp in the descending colon, removed                            with a cold snare. Resected and retrieved.                           - Small internal hemorrhoids.                           - The examination was otherwise normal. Recommendation:           - Patient has a contact number available for                            emergencies. The signs  and symptoms of potential                            delayed complications were discussed with the                            patient. Return to normal activities tomorrow.                            Written discharge instructions were provided to the                            patient.                           - Resume previous diet.                           - Continue present medications.                           - Await pathology results.                           -  Repeat colonoscopy is recommended. The                            colonoscopy date will be determined after pathology                            results from today's exam become available for                            review.                           - Resume Coumadin (warfarin) today and Lovenox                            (enoxaparin) today at prior doses. Refer to                            managing physician for further adjustment of                            therapy. Jerene Bears, MD 11/07/2017 3:29:06 PM This report has been signed electronically.

## 2017-11-07 NOTE — Progress Notes (Signed)
Report to PACU, RN, vss, BBS= Clear.  

## 2017-11-07 NOTE — Patient Instructions (Signed)
Impression/Recommendations:  Polyp handout given to patient., Hemorrhoid handout given to patient,.  Resume previous diet. Continue present medications.  Repeat colonoscopy recommended.  Date to be determined after pathology results reviewed.  Resume Coumadin (warfarin) today and Lovenox (enoxaparin) today at prior doses.  Refer to managing physicians for further adjustment of therapy.  YOU HAD AN ENDOSCOPIC PROCEDURE TODAY AT [facility]:   Refer to the procedure report that was given to you for any specific questions about what was found during the examination.  If the procedure report does not answer your questions, please call your gastroenterologist to clarify.  If you requested that your care partner not be given the details of your procedure findings, then the procedure report has been included in a sealed envelope for you to review at your convenience later.  YOU SHOULD EXPECT: Some feelings of bloating in the abdomen. Passage of more gas than usual.  Walking can help get rid of the air that was put into your GI tract during the procedure and reduce the bloating. If you had a lower endoscopy (such as a colonoscopy or flexible sigmoidoscopy) you may notice spotting of blood in your stool or on the toilet paper. If you underwent a bowel prep for your procedure, you may not have a normal bowel movement for a few days.  Please Note:  You might notice some irritation and congestion in your nose or some drainage.  This is from the oxygen used during your procedure.  There is no need for concern and it should clear up in a day or so.  SYMPTOMS TO REPORT IMMEDIATELY:   Following lower endoscopy (colonoscopy or flexible sigmoidoscopy):  Excessive amounts of blood in the stool  Significant tenderness or worsening of abdominal pains  Swelling of the abdomen that is new, acute  Fever of 100F or higher For urgent or emergent issues, a gastroenterologist can be reached at any  hour by calling [phone].   DIET:  We do recommend a small meal at first, but then you may proceed to your regular diet.  Drink plenty of fluids but you should avoid alcoholic beverages for 24 hours.  ACTIVITY:  You should plan to take it easy for the rest of today and you should NOT DRIVE or use heavy machinery until tomorrow (because of the sedation medicines used during the test).    FOLLOW UP: Our staff will call the number listed on your records the next business day following your procedure to check on you and address any questions or concerns that you may have regarding the information given to you following your procedure. If we do not reach you, we will leave a message.  However, if you are feeling well and you are not experiencing any problems, there is no need to return our call.  We will assume that you have returned to your regular daily activities without incident.  If any biopsies were taken you will be contacted by phone or by letter within the next 1-3 weeks.  Please call us at [phone] if you have not heard about the biopsies in 3 weeks.    SIGNATURES/CONFIDENTIALITY: You and/or your care partner have signed paperwork which will be entered into your electronic medical record.  These signatures attest to the fact that that the information above on your After Visit Summary has been reviewed and is understood.  Full responsibility of the confidentiality of this discharge information lies with you and/or your care-partner.

## 2017-11-08 ENCOUNTER — Telehealth: Payer: Self-pay | Admitting: *Deleted

## 2017-11-08 NOTE — Telephone Encounter (Signed)
  Follow up Call-  Call back number 11/07/2017  Post procedure Call Back phone  # [phone] cell  Permission to leave phone message Yes  Some recent data might be hidden     Patient questions:  Do you have a fever, pain , or abdominal swelling? No. Pain Score  0 *  Have you tolerated food without any problems? Yes.    Have you been able to return to your normal activities? Yes.    Do you have any questions about your discharge instructions: Diet   No. Medications  No. Follow up visit  No.  Do you have questions or concerns about your Care? No.  Actions: * If pain score is 4 or above: No action needed, pain <4.

## 2017-11-13 ENCOUNTER — Ambulatory Visit (INDEPENDENT_AMBULATORY_CARE_PROVIDER_SITE_OTHER): Payer: Medicare HMO | Admitting: *Deleted

## 2017-11-13 ENCOUNTER — Encounter: Payer: Self-pay | Admitting: Internal Medicine

## 2017-11-13 DIAGNOSIS — R600 Localized edema: Secondary | ICD-10-CM

## 2017-11-13 DIAGNOSIS — Z7901 Long term (current) use of anticoagulants: Secondary | ICD-10-CM | POA: Diagnosis not present

## 2017-11-13 DIAGNOSIS — Z5181 Encounter for therapeutic drug level monitoring: Secondary | ICD-10-CM

## 2017-11-13 DIAGNOSIS — Z86711 Personal history of pulmonary embolism: Secondary | ICD-10-CM | POA: Diagnosis not present

## 2017-11-13 LAB — POCT INR: INR: 1.8

## 2017-11-13 NOTE — Patient Instructions (Signed)
Description   Continue Lovenox injections today Dec 19th and tomorrow Dec 20th then discontinue Today Dec 19th take another 1/2 tablet as he has take coumadin 5 mg today then tomorrow Dec 20th take 15mg  then continue same dose of coumadin  10mg  (1 tablet) daily except 5mg  (1/2 tablet) on Mondays, Wednesdays, and Fridays. Recheck in 1 week. Call us with any changes # [phone].

## 2017-11-16 DIAGNOSIS — E0840 Diabetes mellitus due to underlying condition with diabetic neuropathy, unspecified: Secondary | ICD-10-CM | POA: Diagnosis not present

## 2017-11-16 DIAGNOSIS — M4800 Spinal stenosis, site unspecified: Secondary | ICD-10-CM | POA: Diagnosis not present

## 2017-11-20 ENCOUNTER — Ambulatory Visit (INDEPENDENT_AMBULATORY_CARE_PROVIDER_SITE_OTHER): Payer: Medicare HMO

## 2017-11-20 DIAGNOSIS — R600 Localized edema: Secondary | ICD-10-CM

## 2017-11-20 DIAGNOSIS — Z86711 Personal history of pulmonary embolism: Secondary | ICD-10-CM | POA: Diagnosis not present

## 2017-11-20 DIAGNOSIS — Z7901 Long term (current) use of anticoagulants: Secondary | ICD-10-CM

## 2017-11-20 LAB — POCT INR: INR: 1.7

## 2017-11-20 NOTE — Patient Instructions (Signed)
Take an extra 1 tablet today, then start taking 10mg  (1 tablet) daily except 5mg  (1/2 tablet) on Mondays and Fridays. Recheck in 10 days.  Call us with any changes # [phone].

## 2017-12-02 ENCOUNTER — Ambulatory Visit (INDEPENDENT_AMBULATORY_CARE_PROVIDER_SITE_OTHER): Payer: Medicare HMO | Admitting: *Deleted

## 2017-12-02 DIAGNOSIS — R600 Localized edema: Secondary | ICD-10-CM | POA: Diagnosis not present

## 2017-12-02 DIAGNOSIS — Z86711 Personal history of pulmonary embolism: Secondary | ICD-10-CM | POA: Diagnosis not present

## 2017-12-02 DIAGNOSIS — Z7901 Long term (current) use of anticoagulants: Secondary | ICD-10-CM | POA: Diagnosis not present

## 2017-12-02 LAB — POCT INR: INR: 2.8

## 2017-12-02 NOTE — Patient Instructions (Signed)
Description   Continue taking 10mg  (1 tablet) daily except 5mg  (1/2 tablet) on Mondays and Fridays. Recheck in 3 weeks.  Call us with any changes # [phone].

## 2017-12-17 DIAGNOSIS — M4800 Spinal stenosis, site unspecified: Secondary | ICD-10-CM | POA: Diagnosis not present

## 2017-12-17 DIAGNOSIS — E0840 Diabetes mellitus due to underlying condition with diabetic neuropathy, unspecified: Secondary | ICD-10-CM | POA: Diagnosis not present

## 2017-12-23 ENCOUNTER — Ambulatory Visit (INDEPENDENT_AMBULATORY_CARE_PROVIDER_SITE_OTHER): Payer: Medicare HMO | Admitting: *Deleted

## 2017-12-23 DIAGNOSIS — R600 Localized edema: Secondary | ICD-10-CM

## 2017-12-23 DIAGNOSIS — Z86711 Personal history of pulmonary embolism: Secondary | ICD-10-CM | POA: Diagnosis not present

## 2017-12-23 DIAGNOSIS — Z5181 Encounter for therapeutic drug level monitoring: Secondary | ICD-10-CM | POA: Diagnosis not present

## 2017-12-23 DIAGNOSIS — Z7901 Long term (current) use of anticoagulants: Secondary | ICD-10-CM

## 2017-12-23 LAB — POCT INR: INR: 2.3

## 2017-12-23 NOTE — Patient Instructions (Signed)
Description   Continue taking 10mg  (1 tablet) daily except 5mg  (1/2 tablet) on Mondays and Fridays. Recheck in 4 weeks.  Call us with any changes # [phone].

## 2018-01-17 DIAGNOSIS — M4800 Spinal stenosis, site unspecified: Secondary | ICD-10-CM | POA: Diagnosis not present

## 2018-01-17 DIAGNOSIS — E0840 Diabetes mellitus due to underlying condition with diabetic neuropathy, unspecified: Secondary | ICD-10-CM | POA: Diagnosis not present

## 2018-01-20 ENCOUNTER — Ambulatory Visit (INDEPENDENT_AMBULATORY_CARE_PROVIDER_SITE_OTHER): Payer: Medicare HMO | Admitting: *Deleted

## 2018-01-20 DIAGNOSIS — Z7901 Long term (current) use of anticoagulants: Secondary | ICD-10-CM | POA: Diagnosis not present

## 2018-01-20 DIAGNOSIS — Z86711 Personal history of pulmonary embolism: Secondary | ICD-10-CM | POA: Diagnosis not present

## 2018-01-20 DIAGNOSIS — R600 Localized edema: Secondary | ICD-10-CM

## 2018-01-20 LAB — POCT INR: INR: 2.4

## 2018-01-20 NOTE — Patient Instructions (Signed)
Description   Continue taking 10mg  (1 tablet) daily except 5mg  (1/2 tablet) on Mondays and Fridays. Recheck in 5 weeks.  Call us with any changes # [phone].

## 2018-02-14 DIAGNOSIS — E0840 Diabetes mellitus due to underlying condition with diabetic neuropathy, unspecified: Secondary | ICD-10-CM | POA: Diagnosis not present

## 2018-02-14 DIAGNOSIS — M4800 Spinal stenosis, site unspecified: Secondary | ICD-10-CM | POA: Diagnosis not present

## 2018-02-24 ENCOUNTER — Ambulatory Visit (INDEPENDENT_AMBULATORY_CARE_PROVIDER_SITE_OTHER): Payer: Medicare HMO | Admitting: *Deleted

## 2018-02-24 DIAGNOSIS — Z7901 Long term (current) use of anticoagulants: Secondary | ICD-10-CM

## 2018-02-24 DIAGNOSIS — R600 Localized edema: Secondary | ICD-10-CM

## 2018-02-24 DIAGNOSIS — Z86711 Personal history of pulmonary embolism: Secondary | ICD-10-CM | POA: Diagnosis not present

## 2018-02-24 LAB — POCT INR: INR: 2

## 2018-02-24 MED ORDER — WARFARIN SODIUM 10 MG PO TABS: ORAL_TABLET | ORAL | 1 refills | Status: DC

## 2018-02-24 NOTE — Patient Instructions (Signed)
Description   Today take 10mg  then continue taking 10mg  (1 tablet) daily except 5mg  (1/2 tablet) on Mondays and Fridays. Recheck in 6 weeks.  Call us with any changes # [phone].

## 2018-03-17 DIAGNOSIS — E0840 Diabetes mellitus due to underlying condition with diabetic neuropathy, unspecified: Secondary | ICD-10-CM | POA: Diagnosis not present

## 2018-03-17 DIAGNOSIS — M4800 Spinal stenosis, site unspecified: Secondary | ICD-10-CM | POA: Diagnosis not present

## 2018-04-07 ENCOUNTER — Ambulatory Visit (INDEPENDENT_AMBULATORY_CARE_PROVIDER_SITE_OTHER): Payer: Medicare HMO | Admitting: Pharmacist

## 2018-04-07 DIAGNOSIS — Z86711 Personal history of pulmonary embolism: Secondary | ICD-10-CM

## 2018-04-07 DIAGNOSIS — Z7901 Long term (current) use of anticoagulants: Secondary | ICD-10-CM

## 2018-04-07 DIAGNOSIS — R600 Localized edema: Secondary | ICD-10-CM

## 2018-04-07 LAB — POCT INR: INR: 2.7

## 2018-04-07 NOTE — Patient Instructions (Signed)
Description   Continue taking 10mg (1 tablet) daily except 5mg (1/2 tablet) on Mondays. Recheck in 6 weeks. Call us with any changes # [phone].      

## 2018-04-16 DIAGNOSIS — M4800 Spinal stenosis, site unspecified: Secondary | ICD-10-CM | POA: Diagnosis not present

## 2018-04-16 DIAGNOSIS — E0840 Diabetes mellitus due to underlying condition with diabetic neuropathy, unspecified: Secondary | ICD-10-CM | POA: Diagnosis not present

## 2018-05-07 ENCOUNTER — Ambulatory Visit: Payer: Medicare HMO | Admitting: Cardiology

## 2018-05-07 ENCOUNTER — Encounter: Payer: Self-pay | Admitting: Cardiology

## 2018-05-07 ENCOUNTER — Ambulatory Visit (INDEPENDENT_AMBULATORY_CARE_PROVIDER_SITE_OTHER): Payer: Medicare HMO | Admitting: *Deleted

## 2018-05-07 VITALS — BP 136/70 | HR 70 | Wt 345.0 lb

## 2018-05-07 DIAGNOSIS — Z7901 Long term (current) use of anticoagulants: Secondary | ICD-10-CM

## 2018-05-07 DIAGNOSIS — I10 Essential (primary) hypertension: Secondary | ICD-10-CM

## 2018-05-07 DIAGNOSIS — Z6841 Body Mass Index (BMI) 40.0 and over, adult: Secondary | ICD-10-CM

## 2018-05-07 DIAGNOSIS — E119 Type 2 diabetes mellitus without complications: Secondary | ICD-10-CM | POA: Diagnosis not present

## 2018-05-07 DIAGNOSIS — R600 Localized edema: Secondary | ICD-10-CM | POA: Diagnosis not present

## 2018-05-07 DIAGNOSIS — Z86711 Personal history of pulmonary embolism: Secondary | ICD-10-CM

## 2018-05-07 LAB — POCT INR: INR: 3.3 — AB (ref 2.0–3.0)

## 2018-05-07 MED ORDER — FUROSEMIDE 40 MG PO TABS: 40.0000 mg | ORAL_TABLET | Freq: Every day | ORAL | 11 refills | Status: DC

## 2018-05-07 MED ORDER — FUROSEMIDE 40 MG PO TABS: 40.0000 mg | ORAL_TABLET | ORAL | 11 refills | Status: DC | PRN

## 2018-05-07 MED ORDER — POTASSIUM CHLORIDE CRYS ER 20 MEQ PO TBCR: 20.0000 meq | EXTENDED_RELEASE_TABLET | ORAL | 11 refills | Status: DC | PRN

## 2018-05-07 NOTE — Progress Notes (Signed)
Cardiology Office Note:    Date:  05/07/2018   ID:  Nicolas [name], DOB [birth year], MRN [id number]  PCP:  Shawnee Knapp, MD  Cardiologist:  Lauree Chandler, MD  Referring MD: Shawnee Knapp, MD   Chief Complaint  Patient presents with  . Follow-up  . Hypertension    History of Present Illness:    Nicolas [name] is a 52 y.o. male with a past medical history significant for HTN, PE on chronic coumadin therapy, spinal stenosis here today for follow up. He is seen once per year in our office since he is followed in our coumadin clinic.  He is overdue for follow-up with his last appointment having been on 12/08/2015 with Dr. Angelena Form.  He has a history of DVT in 2012 as well as PE in 2005, 2008, 2010.  He has been on Coumadin since 2012.  He had an IVC filter placed in December 2012 when his blood thinner was stopped.  He has had a total of 6 back surgeries for spinal stenosis (spine disorder first diagnosed in 2004).  He was first placed on Demadex in 2013 for fluid retention and lower extremity edema after surgery.  His last echocardiogram in 04/2014 showed normal LV systolic function with EF 55-60% and grade 2 diastolic dysfunction.  Today he is here for routine follow-up with his son. He says that his lasix was ordered prn and he never took them. He lost the bottle. Recently he noted that his legs were feeling heavy. He took 4 of his girl friend's lasix in May and his legs feel lighter. He still has 1-2+ edema up to mid-calf. Usually his swelling gets better overnight, but not fully resolved, but lately it has not gone down as much and his legs are tender. He has no open areas on his legs. He has had no chest pain/pressure/tightness. He does not walk, using a wheelchair due to his spinal stenosis for about the last 6 years. He has occ mild shortness of breath and occ wheezing. His head of bed is elevated for his back but no known orthopnea. No shortness of breath like with his PE.  He cooks most  of his food and limits the salt he uses. He tries to avoid processed foods.    Past Medical History:  Diagnosis Date  . Arthritis   . Clotting disorder ([place])    pulmonary embolus, DVT  . Depression   . Diabetes mellitus without complication (HCC)    no meds, watching diet  . DVT (deep venous thrombosis) ([place])    Summer 2012  . History of home oxygen therapy    uses mouthpiece ventilation with 2 liters supplemental oxygen all the time  . Hypertension   . Neuromuscular disorder ([place])    spinal stenosis.- pt walks with a walker short distance  . Pulmonary embolism ([place])    x 3 in 2005, 2008, 2010  . Sleep apnea    does not wear a c-pap  . Spinal stenosis     Past Surgical History:  Procedure Laterality Date  . CHOLECYSTECTOMY N/A 05/24/2014   Procedure: LAPAROSCOPIC CHOLECYSTECTOMY;  Surgeon: Zenovia Jarred, MD;  Location: [facility];  Service: General;  Laterality: N/A;  . ERCP N/A 05/25/2014   Procedure: ENDOSCOPIC RETROGRADE CHOLANGIOPANCREATOGRAPHY (ERCP);  Surgeon: Beryle Beams, MD;  Location: [facility];  Service: Endoscopy;  Laterality: N/A;  . ERCP N/A 07/16/2014   Procedure: ENDOSCOPIC RETROGRADE CHOLANGIOPANCREATOGRAPHY (ERCP);  Surgeon: Beryle Beams, MD;  Location: [facility];  Service: Endoscopy;  Laterality: N/A;  . ivp filter  jan 2012  . SPINE SURGERY  11/30/11   Total 6 back surgeries  . SPINE SURGERY  24/4010   Dr. Cathren Laine in [place], [place]  . [place]     08/2003, cervial spine 2005, 2010 lower back, 2011 lower back, 2012 & 2013  . UPPER GASTROINTESTINAL ENDOSCOPY    . WISDOM TOOTH EXTRACTION     2 removed    Current Medications: Current Meds  Medication Sig  . furosemide (LASIX) 40 MG tablet Take 1 tablet (40 mg total) by mouth daily.  . metoprolol succinate (TOPROL-XL) 50 MG 24 hr tablet TAKE 1 TABLET EVERY EVENING. TAKE WITH OR IMMEDIATELY FOLLOWING A MEAL  . Multiple Vitamin (MULTIVITAMIN WITH MINERALS) TABS tablet Take 1 tablet by mouth daily.  Reported on 11/09/2015  . potassium chloride SA (K-DUR,KLOR-CON) 20 MEQ tablet Take 1 tablet (20 mEq total) by mouth as needed (with lasix).  Marland Kitchen warfarin (COUMADIN) 10 MG tablet Take as directed by coumadin clinic  . [DISCONTINUED] furosemide (LASIX) 40 MG tablet Take 40 mg by mouth as needed for fluid.  . [DISCONTINUED] furosemide (LASIX) 40 MG tablet Take 1 tablet (40 mg total) by mouth as needed for fluid.  . [DISCONTINUED] potassium chloride SA (K-DUR,KLOR-CON) 20 MEQ tablet Take 20 mEq by mouth as needed (with lasix).   Current Facility-Administered Medications for the 05/07/18 encounter (Office Visit) with Daune Perch, NP  Medication  . 0.9 %  sodium chloride infusion     Allergies:   Lisinopril and Ace inhibitors   Social History   Socioeconomic History  . Marital status: Single    Spouse name: Not on file  . Number of children: 3  . Years of education: 10  . Highest education level: 10th grade  Occupational History  . Occupation: Disability  Social Needs  . Financial resource strain: Not hard at all  . Food insecurity:    Worry: Never true    Inability: Never true  . Transportation needs:    Medical: No    Non-medical: No  Tobacco Use  . Smoking status: Former Smoker    Packs/day: 1.50    Years: 10.00    Pack years: 15.00    Types: Cigarettes    Last attempt to quit: 04/26/2001    Years since quitting: 17.0  . Smokeless tobacco: Never Used  Substance and Sexual Activity  . Alcohol use: Yes    Alcohol/week: 0.5 oz    Types: 1 Standard drinks or equivalent per week    Comment: very little  . Drug use: No  . Sexual activity: Never  Lifestyle  . Physical activity:    Days per week: 6 days    Minutes per session: 50 min  . Stress: Not at all  Relationships  . Social connections:    Talks on phone: More than three times a week    Gets together: More than three times a week    Attends religious service: Never    Active member of club or organization: Yes     Attends meetings of clubs or organizations: More than 4 times per year    Relationship status: Never married  Other Topics Concern  . Not on file  Social History Narrative   Patient is single and lives at home, his daughter lives with him.   Patient has three children.   Patient is disabled.   Patient has a 10 grade education.  Patient is right-handed.   Patient drinks 1 or 2 sodas and tea per week.     Family History: The patient's family history includes Cancer (age of onset: 21) in his mother; Diabetes in his brother and father; Hypertension in his brother, brother, and father; Prostate cancer in his father. There is no history of Heart attack, Stroke, Colon cancer, Esophageal cancer, Pancreatic cancer, Rectal cancer, or Stomach cancer. ROS:   Please see the history of present illness.     All other systems reviewed and are negative.  EKGs/Labs/Other Studies Reviewed:    The following studies were reviewed today:  Echocardiogram 05/23/2014 Study Conclusions - Left ventricle: The cavity size was normal. Wall thickness was normal. Systolic function was normal. The estimated ejection fraction was in the range of 55% to 60%. Features are consistent with a pseudonormal left ventricular filling pattern, with concomitant abnormal relaxation and increased filling pressure (grade 2 diastolic dysfunction). - Impressions: Technically limited study due to poor sound wave transmission.  Impressions: - Technically limited study due to poor sound wave transmission.  EKG:  EKG is ordered today.  The ekg ordered today demonstrates NSR, 70 bpm, no changes  Recent Labs: 10/07/2017: ALT 19; BUN 13; Creatinine, Ser 0.78; Potassium 4.2; Sodium 142   Recent Lipid Panel    Component Value Date/Time   CHOL 181 10/07/2017 1430   TRIG 231 (H) 10/07/2017 1430   HDL 41 10/07/2017 1430   CHOLHDL 4.4 10/07/2017 1430   CHOLHDL 3.4 10/04/2016 0946   VLDL 23 10/04/2016 0946   LDLCALC  94 10/07/2017 1430   LDLDIRECT 82 05/01/2013 1545    Physical Exam:    VS:  BP 136/70   Pulse 70   Wt (!) 345 lb (156.5 kg) Comment: unsure if this is with wheelchair  SpO2 96%   BMI 49.50 kg/m     Wt Readings from Last 3 Encounters:  05/07/18 (!) 345 lb (156.5 kg)  11/07/17 (!) 312 lb (141.5 kg)  10/29/17 (!) 312 lb (141.5 kg)     Physical Exam  Constitutional: He is oriented to person, place, and time. He appears well-developed.  Obese male in wheelchair  HENT:  Head: Normocephalic and atraumatic.  Neck: Normal range of motion. Neck supple. No JVD present.  Cardiovascular: Normal rate, regular rhythm, normal heart sounds and intact distal pulses. Exam reveals no gallop and no friction rub.  No murmur heard. Pulmonary/Chest: Effort normal and breath sounds normal. No respiratory distress. He has no wheezes. He has no rales.  Abdominal: Soft. Bowel sounds are normal.  Musculoskeletal: Normal range of motion. He exhibits edema.  1-2+ pitting edema up to mid calf  Neurological: He is alert and oriented to person, place, and time.  Skin: Skin is warm and dry.  Psychiatric: He has a normal mood and affect. His behavior is normal. Thought content normal.  Vitals reviewed.    ASSESSMENT:    1. Chronic anticoagulation   2. Hx pulmonary embolism   3. Lower extremity edema   4. Essential hypertension   5. Class 3 severe obesity due to excess calories with serious comorbidity and body mass index (BMI) of 40.0 to 44.9 in adult [facility])    PLAN:    In order of problems listed above:  History of repeated pulmonary embolism: On lifelong anticoagulation, warfarin, followed in our clinic. INR therapeutic for all this year. No unusual bleeding. Will check CBC for routine monitoring of coumadin.   Lower extremity edema: Likely secondary to venous  stasis and chronic DVT. He has a hard time elevating his legs during the day while he is in his wheelchair. Has not been using lasix, took  some of his girlfriend's Lasix last month and noticed an improvement in his swelling. Will reorder lasix to be taken daily with KDur. Will check BMet today and in 3 weeks after starting the diuretic. Pt advised to use compression stockings and elevate legs when possible. He says that he can get some compression stockings from his insurance company and does not need a prescription. Also discussed limiting his sodium intake.   Hypertension: On Toprol XL 50 mg daily. BP well controlled.   Severe obesity: Pt is unable to be active as he is wheelchair bound due to spinal stenosis. We discussed using hand weights and resistance bands to get some upper body exericse. We also discussed heart healthy diet with portion control.    Medication Adjustments/Labs and Tests Ordered: Current medicines are reviewed at length with the patient today.  Concerns regarding medicines are outlined above. Labs and tests ordered and medication changes are outlined in the patient instructions below:  Patient Instructions  Medication Instructions: Your physician has recommended you make the following change in your medication:  START:  Lasix 40 mg taking 1 tablet daily  START: Potassium Chloride 20 meq taking 1 tablet by mouth daily   Labwork: TODAY: BMET & CBC  Your physician would like for you to return to our office in 3 weeks for repeat blood work   Procedures/Testing: None   Follow-Up: Your physician wants you to follow-up in: 1 year with Dr. Shirley Friar will receive a reminder letter in the mail two months in advance. If you don't receive a letter, please call our office to schedule the follow-up appointment.   Any Additional Special Instructions Will Be Listed Below (If Applicable).  ELEVATE LEGS AS MUCH AS POSSIBLE. WEAR COMPRESSION STOCKINGS DURING THE DAY.  Cooking With Less Salt Cooking with less salt is one way to reduce the amount of sodium you get from food. Depending on your condition and overall  health, your health care provider or diet and nutrition specialist (dietitian) may recommend that you reduce your sodium intake. Most people should have less than 2,300 milligrams (mg) of sodium each day. If you have high blood pressure (hypertension), you may need to limit your sodium to 1,500 mg each day. Follow the tips below to help reduce your sodium intake. What do I need to know about cooking with less salt? Shopping  Buy sodium-free or low-sodium products. Look for the following words on food labels: ? Low-sodium. ? Sodium-free. ? Reduced-sodium. ? No salt added. ? Unsalted.  Buy fresh or frozen vegetables. Avoid canned vegetables.  Avoid buying meats or protein foods that have been injected with broth or saline solution.  Avoid cured or smoked meats, such as hot dogs, bacon, salami, ham, and bologna. Reading food labels  Check the food label before buying or using packaged ingredients.  Look for products with no more than 140 mg of sodium in one serving.  Do not choose foods with salt as one of the first three ingredients on the ingredients list. If salt is one of the first three ingredients, it usually means the item is high in sodium, because ingredients are listed in order of amount in the food item. Cooking  Use herbs, seasonings without salt, and spices as substitutes for salt in foods.  Use sodium-free baking soda when baking.  Arsenio Katz,  or roast foods to add flavor with less salt.  Avoid adding salt to pasta, rice, or hot cereals while cooking.  Drain and rinse canned vegetables before use.  Avoid adding salt when cooking sweets and desserts.  Cook with low-sodium ingredients. What are some salt alternatives? The following are herbs, seasonings, and spices that can be used instead of salt to give taste to your food. Herbs should be fresh or dried. Do not choose packaged mixes. Next to the name of the herb, spice, or seasoning are some examples of foods you  can pair it with. Herbs  Bay leaves - Soups, meat and vegetable dishes, and spaghetti sauce.  Basil - [employer], soups, pasta, and fish dishes.  Cilantro - Meat, poultry, and vegetable dishes.  Chili powder - Marinades and Mexican dishes.  Chives - Salad dressings and potato dishes.  Cumin - Mexican dishes, couscous, and meat dishes.  Dill - Fish dishes, sauces, and salads.  Fennel - Meat and vegetable dishes, breads, and cookies.  Garlic (do not use garlic salt) - [place] dishes, meat dishes, salad dressings, and sauces.  Marjoram - Soups, potato dishes, and meat dishes.  Oregano - Pizza and spaghetti sauce.  Parsley - Salads, soups, pasta, and meat dishes.  Rosemary - [place] dishes, salad dressings, soups, and red meats.  Saffron - Fish dishes, pasta, and some poultry dishes.  Sage - Stuffings and sauces.  Tarragon - Fish and [employer].  Thyme - Stuffing, meat, and fish dishes. Seasonings  Lemon juice - Fish dishes, poultry dishes, vegetables, and salads.  Vinegar - Salad dressings, vegetables, and fish dishes. Spices  Cinnamon - Sweet dishes, such as cakes, cookies, and puddings.  Cloves - Gingerbread, puddings, and marinades for meats.  Curry - Vegetable dishes, fish and poultry dishes, and stir-fry dishes.  Ginger - Vegetables dishes, fish dishes, and stir-fry dishes.  Nutmeg - Pasta, vegetables, poultry, fish dishes, and custard. What are some low-sodium ingredients and foods?  Fresh or frozen fruits and vegetables with no sauce added.  Fresh or frozen whole meats, poultry, and fish with no sauce added.  Eggs.  Noodles, pasta, quinoa, rice.  Shredded or puffed wheat or puffed rice.  Regular or quick oats.  Milk, yogurt, hard cheeses, and low-sodium cheeses. Good cheese choices include Swiss, [place]. Always check the label for the serving size and sodium content.  Unsalted butter or margarine.  Unsalted  nuts.  Sherbet or ice cream (keep to  cup per serving).  Homemade pudding.  Sodium-free baking soda and baking powder. This is not a complete list of low-sodium ingredients and foods. Contact your dietitian for more options. Summary  Cooking with less salt is one way to reduce the amount of sodium that you get from food.  Buy sodium-free or low-sodium products.  Check the food label before using or buying packaged ingredients.  Use herbs, seasonings without salt, and spices as substitutes for salt in food    If you need a refill on your cardiac medications before your next appointment, please call your pharmacy.      Signed, Daune Perch, NP  05/07/2018 6:04 PM    [place]

## 2018-05-07 NOTE — Patient Instructions (Addendum)
Medication Instructions: Your physician has recommended you make the following change in your medication:  START:  Lasix 40 mg taking 1 tablet daily  START: Potassium Chloride 20 meq taking 1 tablet by mouth daily   Labwork: TODAY: BMET & CBC  Your physician would like for you to return to our office in 3 weeks for repeat blood work   Procedures/Testing: None   Follow-Up: Your physician wants you to follow-up in: 1 year with Dr. Shirley Friar will receive a reminder letter in the mail two months in advance. If you don't receive a letter, please call our office to schedule the follow-up appointment.   Any Additional Special Instructions Will Be Listed Below (If Applicable).  ELEVATE LEGS AS MUCH AS POSSIBLE. WEAR COMPRESSION STOCKINGS DURING THE DAY.  Cooking With Less Salt Cooking with less salt is one way to reduce the amount of sodium you get from food. Depending on your condition and overall health, your health care provider or diet and nutrition specialist (dietitian) may recommend that you reduce your sodium intake. Most people should have less than 2,300 milligrams (mg) of sodium each day. If you have high blood pressure (hypertension), you may need to limit your sodium to 1,500 mg each day. Follow the tips below to help reduce your sodium intake. What do I need to know about cooking with less salt? Shopping  Buy sodium-free or low-sodium products. Look for the following words on food labels: ? Low-sodium. ? Sodium-free. ? Reduced-sodium. ? No salt added. ? Unsalted.  Buy fresh or frozen vegetables. Avoid canned vegetables.  Avoid buying meats or protein foods that have been injected with broth or saline solution.  Avoid cured or smoked meats, such as hot dogs, bacon, salami, ham, and bologna. Reading food labels  Check the food label before buying or using packaged ingredients.  Look for products with no more than 140 mg of sodium in one serving.  Do not choose foods  with salt as one of the first three ingredients on the ingredients list. If salt is one of the first three ingredients, it usually means the item is high in sodium, because ingredients are listed in order of amount in the food item. Cooking  Use herbs, seasonings without salt, and spices as substitutes for salt in foods.  Use sodium-free baking soda when baking.  Grill, braise, or roast foods to add flavor with less salt.  Avoid adding salt to pasta, rice, or hot cereals while cooking.  Drain and rinse canned vegetables before use.  Avoid adding salt when cooking sweets and desserts.  Cook with low-sodium ingredients. What are some salt alternatives? The following are herbs, seasonings, and spices that can be used instead of salt to give taste to your food. Herbs should be fresh or dried. Do not choose packaged mixes. Next to the name of the herb, spice, or seasoning are some examples of foods you can pair it with. Herbs  Bay leaves - Soups, meat and vegetable dishes, and spaghetti sauce.  Basil - [employer], soups, pasta, and fish dishes.  Cilantro - Meat, poultry, and vegetable dishes.  Chili powder - Marinades and Mexican dishes.  Chives - Salad dressings and potato dishes.  Cumin - Mexican dishes, couscous, and meat dishes.  Dill - Fish dishes, sauces, and salads.  Fennel - Meat and vegetable dishes, breads, and cookies.  Garlic (do not use garlic salt) - [place] dishes, meat dishes, salad dressings, and sauces.  Marjoram - Soups, potato dishes, and  meat dishes.  Oregano - Pizza and spaghetti sauce.  Parsley - Salads, soups, pasta, and meat dishes.  Rosemary - [place] dishes, salad dressings, soups, and red meats.  Saffron - Fish dishes, pasta, and some poultry dishes.  Sage - Stuffings and sauces.  Tarragon - Fish and [employer].  Thyme - Stuffing, meat, and fish dishes. Seasonings  Lemon juice - Fish dishes, poultry dishes, vegetables, and  salads.  Vinegar - Salad dressings, vegetables, and fish dishes. Spices  Cinnamon - Sweet dishes, such as cakes, cookies, and puddings.  Cloves - Gingerbread, puddings, and marinades for meats.  Curry - Vegetable dishes, fish and poultry dishes, and stir-fry dishes.  Ginger - Vegetables dishes, fish dishes, and stir-fry dishes.  Nutmeg - Pasta, vegetables, poultry, fish dishes, and custard. What are some low-sodium ingredients and foods?  Fresh or frozen fruits and vegetables with no sauce added.  Fresh or frozen whole meats, poultry, and fish with no sauce added.  Eggs.  Noodles, pasta, quinoa, rice.  Shredded or puffed wheat or puffed rice.  Regular or quick oats.  Milk, yogurt, hard cheeses, and low-sodium cheeses. Good cheese choices include Swiss, [place]. Always check the label for the serving size and sodium content.  Unsalted butter or margarine.  Unsalted nuts.  Sherbet or ice cream (keep to  cup per serving).  Homemade pudding.  Sodium-free baking soda and baking powder. This is not a complete list of low-sodium ingredients and foods. Contact your dietitian for more options. Summary  Cooking with less salt is one way to reduce the amount of sodium that you get from food.  Buy sodium-free or low-sodium products.  Check the food label before using or buying packaged ingredients.  Use herbs, seasonings without salt, and spices as substitutes for salt in food    If you need a refill on your cardiac medications before your next appointment, please call your pharmacy.

## 2018-05-07 NOTE — Patient Instructions (Addendum)
Description   Hold today's dose then continue taking 10mg  (1 tablet) daily except 5mg  (1/2 tablet) on Mondays. Recheck in 3 weeks with Lab appt.  Call us with any changes # [phone].

## 2018-05-08 LAB — BASIC METABOLIC PANEL
BUN/Creatinine Ratio: 19 (ref 9–20)
BUN: 13 mg/dL (ref 6–24)
CO2: 27 mmol/L (ref 20–29)
Calcium: 9.2 mg/dL (ref 8.7–10.2)
Chloride: 103 mmol/L (ref 96–106)
Creatinine, Ser: 0.69 mg/dL — ABNORMAL LOW (ref 0.76–1.27)
GFR calc Af Amer: 127 mL/min/{1.73_m2} (ref >59)
GFR calc non Af Amer: 110 mL/min/{1.73_m2} (ref >59)
Glucose: 85 mg/dL (ref 65–99)
Potassium: 4.4 mmol/L (ref 3.5–5.2)
Sodium: 142 mmol/L (ref 134–144)

## 2018-05-08 LAB — CBC
Hematocrit: 42.9 % (ref 37.5–51.0)
Hemoglobin: 14.1 g/dL (ref 13.0–17.7)
MCH: 30.3 pg (ref 26.6–33.0)
MCHC: 32.9 g/dL (ref 31.5–35.7)
MCV: 92 fL (ref 79–97)
Platelets: 194 10*3/uL (ref 150–450)
RBC: 4.66 x10E6/uL (ref 4.14–5.80)
RDW: 14.7 % (ref 12.3–15.4)
WBC: 7.1 10*3/uL (ref 3.4–10.8)

## 2018-05-28 ENCOUNTER — Ambulatory Visit (INDEPENDENT_AMBULATORY_CARE_PROVIDER_SITE_OTHER): Payer: Medicare HMO | Admitting: *Deleted

## 2018-05-28 ENCOUNTER — Other Ambulatory Visit: Payer: Medicare HMO | Admitting: *Deleted

## 2018-05-28 DIAGNOSIS — I10 Essential (primary) hypertension: Secondary | ICD-10-CM

## 2018-05-28 DIAGNOSIS — R600 Localized edema: Secondary | ICD-10-CM

## 2018-05-28 DIAGNOSIS — Z86711 Personal history of pulmonary embolism: Secondary | ICD-10-CM | POA: Diagnosis not present

## 2018-05-28 DIAGNOSIS — Z7901 Long term (current) use of anticoagulants: Secondary | ICD-10-CM

## 2018-05-28 LAB — POCT INR: INR: 2.8 (ref 2.0–3.0)

## 2018-05-28 NOTE — Patient Instructions (Signed)
Description   Continue taking 10mg  (1 tablet) daily except 5mg  (1/2 tablet) on Mondays. Recheck in 4 weeks. Call us with any changes # [phone].

## 2018-05-29 LAB — BASIC METABOLIC PANEL
BUN/Creatinine Ratio: 17 (ref 9–20)
BUN: 13 mg/dL (ref 6–24)
CO2: 27 mmol/L (ref 20–29)
Calcium: 9.2 mg/dL (ref 8.7–10.2)
Chloride: 102 mmol/L (ref 96–106)
Creatinine, Ser: 0.76 mg/dL (ref 0.76–1.27)
GFR calc Af Amer: 122 mL/min/{1.73_m2} (ref >59)
GFR calc non Af Amer: 106 mL/min/{1.73_m2} (ref >59)
Glucose: 80 mg/dL (ref 65–99)
Potassium: 4.2 mmol/L (ref 3.5–5.2)
Sodium: 143 mmol/L (ref 134–144)

## 2018-05-30 ENCOUNTER — Other Ambulatory Visit: Payer: Self-pay | Admitting: Cardiovascular Disease

## 2018-05-30 MED ORDER — POTASSIUM CHLORIDE CRYS ER 20 MEQ PO TBCR: 20.0000 meq | EXTENDED_RELEASE_TABLET | ORAL | 3 refills | Status: DC | PRN

## 2018-05-30 MED ORDER — FUROSEMIDE 40 MG PO TABS: 40.0000 mg | ORAL_TABLET | Freq: Every day | ORAL | 3 refills | Status: DC

## 2018-05-30 NOTE — Telephone Encounter (Signed)
Called pt to inform him of his lab results and pt stated that his medications were sent to the wrong pharmacy. I resent pt's medications to the correct pharmacy as pt requested. Confirmation received.

## 2018-07-11 ENCOUNTER — Telehealth: Payer: Self-pay | Admitting: Family Medicine

## 2018-07-11 NOTE — Telephone Encounter (Signed)
Copied from [place] [id number]. Topic: Inquiry >> Jul 11, 2018  4:17 PM Margot Ables wrote: Reason for CRM: Pt is requesting a letter from [name] to provider to the housing authority stating that he requires a live-in aid to help him. Pt father will be moving with him to help care for him. Please denote the reason pt needs assistance (DX, condition, etc). Please call pt to come in and pick up or ok to mail to him.  Verified address:  [place], [place] [address]

## 2018-07-15 NOTE — Telephone Encounter (Signed)
Please advise 

## 2018-08-08 ENCOUNTER — Ambulatory Visit: Payer: Medicare HMO | Admitting: Family Medicine

## 2018-08-19 ENCOUNTER — Telehealth: Payer: Self-pay | Admitting: Family Medicine

## 2018-08-19 NOTE — Telephone Encounter (Signed)
Called and spoke with pt to reschedule appt with [name] from 08/29/18. I was able to reschedule  to 10/28/18 with [name] at 11:20 am. I advised of time, building number and late policy.

## 2018-08-29 ENCOUNTER — Ambulatory Visit: Payer: Medicare HMO | Admitting: Family Medicine

## 2018-09-03 ENCOUNTER — Ambulatory Visit (INDEPENDENT_AMBULATORY_CARE_PROVIDER_SITE_OTHER): Payer: Medicare HMO | Admitting: *Deleted

## 2018-09-03 DIAGNOSIS — Z7901 Long term (current) use of anticoagulants: Secondary | ICD-10-CM

## 2018-09-03 DIAGNOSIS — R600 Localized edema: Secondary | ICD-10-CM

## 2018-09-03 DIAGNOSIS — Z86711 Personal history of pulmonary embolism: Secondary | ICD-10-CM | POA: Diagnosis not present

## 2018-09-03 LAB — POCT INR: INR: 2.4 (ref 2.0–3.0)

## 2018-09-03 MED ORDER — WARFARIN SODIUM 10 MG PO TABS: ORAL_TABLET | ORAL | 0 refills | Status: DC

## 2018-09-03 NOTE — Patient Instructions (Signed)
Description   Continue taking 10mg  (1 tablet) daily except 5mg  (1/2 tablet) on Mondays. Recheck in 6 weeks. Call us with any changes # [phone].

## 2018-09-26 ENCOUNTER — Other Ambulatory Visit: Payer: Self-pay | Admitting: Family Medicine

## 2018-09-26 DIAGNOSIS — I10 Essential (primary) hypertension: Secondary | ICD-10-CM

## 2018-10-13 ENCOUNTER — Telehealth: Payer: Self-pay | Admitting: Family Medicine

## 2018-10-13 NOTE — Telephone Encounter (Signed)
Called and spoke with pt regarding their appt with [name] on 10/28/18. I explained that [name] is unavailable that day and I was able to get pt rescheduled for 10/31/18 with [name] at the same time as the original appt. I advised of time, building number and late policy.  Pt was agreeable and acknowledged.

## 2018-10-15 ENCOUNTER — Ambulatory Visit (INDEPENDENT_AMBULATORY_CARE_PROVIDER_SITE_OTHER): Payer: Medicare HMO | Admitting: *Deleted

## 2018-10-15 DIAGNOSIS — Z7901 Long term (current) use of anticoagulants: Secondary | ICD-10-CM

## 2018-10-15 DIAGNOSIS — Z86711 Personal history of pulmonary embolism: Secondary | ICD-10-CM | POA: Diagnosis not present

## 2018-10-15 DIAGNOSIS — R600 Localized edema: Secondary | ICD-10-CM

## 2018-10-15 LAB — POCT INR: INR: 4.1 — AB (ref 2.0–3.0)

## 2018-10-15 NOTE — Patient Instructions (Signed)
Description   Skip tomorrow's dose, on Friday only take 1/2 tablet, then Continue taking 10mg  (1 tablet) daily except 5mg  (1/2 tablet) on Mondays. Recheck in 2 weeks. Call us with any changes # [phone].

## 2018-10-23 ENCOUNTER — Other Ambulatory Visit: Payer: Self-pay | Admitting: Family Medicine

## 2018-10-23 DIAGNOSIS — I10 Essential (primary) hypertension: Secondary | ICD-10-CM

## 2018-10-24 NOTE — Telephone Encounter (Signed)
Appt scheduled for 10/31/18.

## 2018-10-28 ENCOUNTER — Ambulatory Visit: Payer: Medicare HMO | Admitting: Family Medicine

## 2018-10-30 ENCOUNTER — Ambulatory Visit (INDEPENDENT_AMBULATORY_CARE_PROVIDER_SITE_OTHER): Payer: Medicare HMO | Admitting: Pharmacist

## 2018-10-30 DIAGNOSIS — Z7901 Long term (current) use of anticoagulants: Secondary | ICD-10-CM | POA: Diagnosis not present

## 2018-10-30 DIAGNOSIS — R600 Localized edema: Secondary | ICD-10-CM

## 2018-10-30 DIAGNOSIS — Z86711 Personal history of pulmonary embolism: Secondary | ICD-10-CM

## 2018-10-30 LAB — POCT INR: INR: 4.5 — AB (ref 2.0–3.0)

## 2018-10-30 NOTE — Patient Instructions (Signed)
Description   Skip your Coumadin tomorrow and Saturday, then start taking 1 tablet daily except 1/2 tablet on Mondays and Fridays. Recheck in 2 weeks. Call us with any changes # [phone].

## 2018-10-31 ENCOUNTER — Ambulatory Visit (INDEPENDENT_AMBULATORY_CARE_PROVIDER_SITE_OTHER): Payer: Medicare HMO

## 2018-10-31 ENCOUNTER — Other Ambulatory Visit: Payer: Self-pay

## 2018-10-31 ENCOUNTER — Ambulatory Visit (INDEPENDENT_AMBULATORY_CARE_PROVIDER_SITE_OTHER): Payer: Medicare HMO | Admitting: Family Medicine

## 2018-10-31 ENCOUNTER — Encounter: Payer: Self-pay | Admitting: Family Medicine

## 2018-10-31 VITALS — BP 139/86 | HR 80 | Temp 98.1°F | Resp 20

## 2018-10-31 DIAGNOSIS — M5441 Lumbago with sciatica, right side: Secondary | ICD-10-CM

## 2018-10-31 DIAGNOSIS — G8929 Other chronic pain: Secondary | ICD-10-CM

## 2018-10-31 DIAGNOSIS — G959 Disease of spinal cord, unspecified: Secondary | ICD-10-CM | POA: Diagnosis not present

## 2018-10-31 DIAGNOSIS — M25551 Pain in right hip: Secondary | ICD-10-CM

## 2018-10-31 DIAGNOSIS — I10 Essential (primary) hypertension: Secondary | ICD-10-CM

## 2018-10-31 DIAGNOSIS — R29898 Other symptoms and signs involving the musculoskeletal system: Secondary | ICD-10-CM

## 2018-10-31 DIAGNOSIS — M1611 Unilateral primary osteoarthritis, right hip: Secondary | ICD-10-CM | POA: Diagnosis not present

## 2018-10-31 DIAGNOSIS — Z23 Encounter for immunization: Secondary | ICD-10-CM | POA: Diagnosis not present

## 2018-10-31 DIAGNOSIS — R7303 Prediabetes: Secondary | ICD-10-CM

## 2018-10-31 DIAGNOSIS — M545 Low back pain: Secondary | ICD-10-CM | POA: Diagnosis not present

## 2018-10-31 LAB — POCT URINALYSIS DIP (MANUAL ENTRY)
Bilirubin, UA: NEGATIVE
Blood, UA: NEGATIVE
Glucose, UA: 100 mg/dL — AB
Ketones, POC UA: NEGATIVE mg/dL
Leukocytes, UA: NEGATIVE
Nitrite, UA: NEGATIVE
Protein Ur, POC: 30 mg/dL — AB
Spec Grav, UA: 1.025 (ref 1.010–1.025)
Urobilinogen, UA: 1 E.U./dL
pH, UA: 6.5 (ref 5.0–8.0)

## 2018-10-31 NOTE — Patient Instructions (Addendum)
If you have lab work done today you will be contacted with your lab results within the next 2 weeks.  If you have not heard from [place] then please contact us. The fastest way to get your results is to register for My Chart.   IF you received an x-ray today, you will receive an invoice from [facility]. Please contact [facility] at [phone] with questions or concerns regarding your invoice.   IF you received labwork today, you will receive an invoice from [facility]. Please contact [facility] at [phone] with questions or concerns regarding your invoice.   Our billing staff will not be able to assist you with questions regarding bills from these companies.  You will be contacted with the lab results as soon as they are available. The fastest way to get your results is to activate your My Chart account. Instructions are located on the last page of this paperwork. If you have not heard from [place] regarding the results in 2 weeks, please contact this office.     Epidermal Cyst An epidermal cyst is sometimes called an epidermal inclusion cyst or an infundibular cyst. It is a sac made of skin tissue. The sac contains a substance called keratin. Keratin is a protein that is normally secreted through the hair follicles. When keratin becomes trapped in the top layer of skin (epidermis), it can form an epidermal cyst. Epidermal cysts are usually found on the face, neck, trunk, and genitals. These cysts are usually harmless (benign), and they may not cause symptoms unless they become infected. It is important not to pop epidermal cysts yourself. What are the causes? This condition may be caused by:  A blocked hair follicle.  A hair that curls and re-enters the skin instead of growing straight out of the skin (ingrown hair).  A blocked pore.  Irritated skin.  An injury to the skin.  Certain conditions that are passed along from parent to child (inherited).  Human  papillomavirus (HPV).  What increases the risk? The following factors may make you more likely to develop an epidermal cyst:  Having acne.  Being overweight.  Wearing tight clothing.  What are the signs or symptoms? The only symptom of this condition may be a small, painless lump underneath the skin. When an epidermal cyst becomes infected, symptoms may include:  Redness.  Inflammation.  Tenderness.  Warmth.  Fever.  Keratin draining from the cyst. Keratin may look like a grayish-white, bad-smelling substance.  Pus draining from the cyst.  How is this diagnosed? This condition is diagnosed with a physical exam. In some cases, you may have a sample of tissue (biopsy) taken from your cyst to be examined under a microscope or tested for bacteria. You may be referred to a health care provider who specializes in skin care (dermatologist). How is this treated? In many cases, epidermal cysts go away on their own without treatment. If a cyst becomes infected, treatment may include:  Opening and draining the cyst. After draining, minor surgery to remove the rest of the cyst may be done.  Antibiotic medicine to help prevent infection.  Injections of medicines (steroids) that help to reduce inflammation.  Surgery to remove the cyst. Surgery may be done if: ? The cyst becomes large. ? The cyst bothers you. ? There is a chance that the cyst could turn into cancer.  Follow these instructions at home:  Take over-the-counter and prescription medicines only as told by your health care provider.  If you were prescribed an antibiotic, use it as told by your health care provider. Do not stop using the antibiotic even if you start to feel better.  Keep the area around your cyst clean and dry.  Wear loose, dry clothing.  Do not try to pop your cyst.  Avoid touching your cyst.  Check your cyst every day for signs of infection.  Keep all follow-up visits as told by your health  care provider. This is important. How is this prevented?  Wear clean, dry, clothing.  Avoid wearing tight clothing.  Keep your skin clean and dry. Shower or take baths every day.  Wash your body with a benzoyl peroxide wash when you shower or bathe. Contact a health care provider if:  Your cyst develops symptoms of infection.  Your condition is not improving or is getting worse.  You develop a cyst that looks different from other cysts you have had.  You have a fever. Get help right away if:  Redness spreads from the cyst into the surrounding area. This information is not intended to replace advice given to you by your health care provider. Make sure you discuss any questions you have with your health care provider. Document Released: 10/13/2004 Document Revised: 07/11/2016 Document Reviewed: 09/14/2015 Elsevier Interactive Patient Education  2018 [place].  Epidermal Cyst Removal Epidermal cyst removal is a procedure to remove a sac of oily material that forms under your skin (epidermal cyst). Epidermal cysts may also be called epidermoid cysts or keratin cysts. Normally, the skin secretes this oily material through a gland or a hair follicle. This type of cyst usually results when a skin gland or hair follicle becomes blocked. You may need this procedure if you have an epidermal cyst that becomes large, uncomfortable, or infected. Tell a health care provider about:  Any allergies you have.  All medicines you are taking, including vitamins, herbs, eye drops, creams, and over-the-counter medicines.  Any problems you or family members have had with anesthetic medicines.  Any blood disorders you have.  Any surgeries you have had.  Any medical conditions you have. What are the risks? Generally, this is a safe procedure. However, problems may occur, including:  Developing another cyst.  Bleeding.  Infection.  Scarring.  What happens before the procedure?  Ask  your health care provider about: ? Changing or stopping your regular medicines. This is especially important if you are taking diabetes medicines or blood thinners. ? Taking medicines such as aspirin and ibuprofen. These medicines can thin your blood. Do not take these medicines before your procedure if your health care provider instructs you not to.  If you have an infected cyst, you may have to take antibiotic medicines before or after the cyst removal. Take your antibiotics as directed by your health care provider. Finish all of the medicine even if you start to feel better.  Take a shower on the morning of your procedure. Your health care provider may ask you to use a germ-killing (antiseptic) soap. What happens during the procedure?  You will be given a medicine that numbs the area (local anesthetic).  The skin around the cyst will be cleaned with a germ-killing solution (antiseptic).  Your health care provider will make a small surgical incision over the cyst.  The cyst will be separated from the surrounding tissues that are under your skin.  If possible, the cyst will be removed undamaged (intact).  If the cyst bursts (ruptures), it will need to be  removed in pieces.  After the cyst is removed, your health care provider will control any bleeding and close the incision with small stitches (sutures). Small incisions may not need sutures, and the bleeding will be controlled by applying direct pressure with gauze.  Your health care provider may apply antibiotic ointment and a light bandage (dressing) over the incision. This procedure may vary among health care providers and hospitals. What happens after the procedure?  If your cyst ruptured during surgery, you may need to take antibiotic medicine. If you were prescribed an antibiotic medicine, finish all of it even if you start to feel better. This information is not intended to replace advice given to you by your health care provider.  Make sure you discuss any questions you have with your health care provider. Document Released: 11/09/2000 Document Revised: 04/19/2016 Document Reviewed: 07/28/2014 Elsevier Interactive Patient Education  2018 [place].  Epidermal Cyst Removal, Care After Refer to this sheet in the next few weeks. These instructions provide you with information about caring for yourself after your procedure. Your health care provider may also give you more specific instructions. Your treatment has been planned according to current medical practices, but problems sometimes occur. Call your health care provider if you have any problems or questions after your procedure. What can I expect after the procedure? After the procedure, it is common to have:  Soreness in the area where your cyst was removed.  Tightness or itching from your skin sutures.  Follow these instructions at home:  Take medicines only as directed by your health care provider.  If you were prescribed an antibiotic medicine, finish all of it even if you start to feel better.  Use antibiotic ointment as directed by your health care provider. Follow the instructions carefully.  There are many different ways to close and cover an incision, including stitches (sutures), skin glue, and adhesive strips. Follow your health care provider's instructions about: ? Incision care. ? Bandage (dressing) changes and removal. ? Incision closure removal.  Keep the bandage (dressing) dry until your health care provider says that it can be removed. Take sponge baths only. Ask your health care provider when you can start showering or taking a bath.  After your dressing is off, check your incision every day for signs of infection. Watch for: ? Redness, swelling, or pain. ? Fluid, blood, or pus.  You can return to your normal activities. Do not do anything that stretches or puts pressure on your incision.  You can return to your normal diet.  Keep all  follow-up visits as directed by your health care provider. This is important. Contact a health care provider if:  You have a fever.  Your incision bleeds.  You have redness, swelling, or pain in the incision area.  You have fluid, blood, or pus coming from your incision.  Your cyst comes back after surgery. This information is not intended to replace advice given to you by your health care provider. Make sure you discuss any questions you have with your health care provider. Document Released: 12/03/2014 Document Revised: 04/19/2016 Document Reviewed: 07/28/2014 Elsevier Interactive Patient Education  2018 [employer].

## 2018-10-31 NOTE — Progress Notes (Signed)
Subjective:    Patient: Nicolas [name]  DOB: 06-Dec-1965; 52 y.o.   MRN: [id number]  Chief Complaint  Patient presents with  . Hypertension    f/u  . Diabetes    f/u    HPI Bump on his chest x 3 months varying in size - sometimes it stinks  When lay flat on his back, from his hip down on his right side, he looses all ability to move his right lower extremity   Taking lasix qod. Occ calf muscle cramps. Normal urination.  Takes K pill "every blue moon"   Is fasting today.  Taking toprol qd  Medical History Past Medical History:  Diagnosis Date  . Arthritis   . Clotting disorder ([place])    pulmonary embolus, DVT  . Depression   . Diabetes mellitus without complication (HCC)    no meds, watching diet  . DVT (deep venous thrombosis) ([place])    Summer 2012  . History of home oxygen therapy    uses mouthpiece ventilation with 2 liters supplemental oxygen all the time  . Hypertension   . Neuromuscular disorder ([place])    spinal stenosis.- pt walks with a walker short distance  . Pulmonary embolism ([place])    x 3 in 2005, 2008, 2010  . Sleep apnea    does not wear a c-pap  . Spinal stenosis    Past Surgical History:  Procedure Laterality Date  . CHOLECYSTECTOMY N/A 05/24/2014   Procedure: LAPAROSCOPIC CHOLECYSTECTOMY;  Surgeon: Zenovia Jarred, MD;  Location: [facility];  Service: General;  Laterality: N/A;  . ERCP N/A 05/25/2014   Procedure: ENDOSCOPIC RETROGRADE CHOLANGIOPANCREATOGRAPHY (ERCP);  Surgeon: Beryle Beams, MD;  Location: [facility];  Service: Endoscopy;  Laterality: N/A;  . ERCP N/A 07/16/2014   Procedure: ENDOSCOPIC RETROGRADE CHOLANGIOPANCREATOGRAPHY (ERCP);  Surgeon: Beryle Beams, MD;  Location: [facility];  Service: Endoscopy;  Laterality: N/A;  . ivp filter  jan 2012  . SPINE SURGERY  11/30/11   Total 6 back surgeries  . SPINE SURGERY  76/2831   Dr. Cathren Laine in [place], [place]  . [place]     08/2003, cervial spine 2005, 2010 lower back, 2011 lower back,  2012 & 2013  . UPPER GASTROINTESTINAL ENDOSCOPY    . WISDOM TOOTH EXTRACTION     2 removed   Current Outpatient Medications on File Prior to Visit  Medication Sig Dispense Refill  . furosemide (LASIX) 40 MG tablet Take 1 tablet (40 mg total) by mouth daily. 90 tablet 3  . metoprolol succinate (TOPROL-XL) 50 MG 24 hr tablet TAKE 1 TABLET EVERY EVENING WITH OR IMMEDIATELY FOLLOWING A MEAL 30 tablet 0  . Multiple Vitamin (MULTIVITAMIN WITH MINERALS) TABS tablet Take 1 tablet by mouth daily. Reported on 11/09/2015    . potassium chloride SA (K-DUR,KLOR-CON) 20 MEQ tablet Take 1 tablet (20 mEq total) by mouth as needed (with lasix). 90 tablet 3  . warfarin (COUMADIN) 10 MG tablet Take as directed by coumadin clinic 90 tablet 0   Current Facility-Administered Medications on File Prior to Visit  Medication Dose Route Frequency Provider Last Rate Last Dose  . 0.9 %  sodium chloride infusion  500 mL Intravenous Continuous Pyrtle, Lajuan Lines, MD       Allergies  Allergen Reactions  . Lisinopril Other (See Comments)    cough  . Ace Inhibitors Other (See Comments)    cough   Family History  Problem Relation Age of Onset  . Cancer Mother 33  Lung  . Diabetes Father   . Prostate cancer Father   . Hypertension Father   . Hypertension Brother   . Hypertension Brother   . Diabetes Brother   . Heart attack Neg Hx   . Stroke Neg Hx   . Colon cancer Neg Hx   . Esophageal cancer Neg Hx   . Pancreatic cancer Neg Hx   . Rectal cancer Neg Hx   . Stomach cancer Neg Hx    Social History   Socioeconomic History  . Marital status: Single    Spouse name: Not on file  . Number of children: 3  . Years of education: 10  . Highest education level: 10th grade  Occupational History  . Occupation: Disability  Social Needs  . Financial resource strain: Not hard at all  . Food insecurity:    Worry: Never true    Inability: Never true  . Transportation needs:    Medical: No    Non-medical: No    Tobacco Use  . Smoking status: Former Smoker    Packs/day: 1.50    Years: 10.00    Pack years: 15.00    Types: Cigarettes    Last attempt to quit: 04/26/2001    Years since quitting: 17.5  . Smokeless tobacco: Never Used  Substance and Sexual Activity  . Alcohol use: Yes    Alcohol/week: 1.0 standard drinks    Types: 1 Standard drinks or equivalent per week    Comment: very little  . Drug use: No  . Sexual activity: Yes  Lifestyle  . Physical activity:    Days per week: 6 days    Minutes per session: 50 min  . Stress: Not at all  Relationships  . Social connections:    Talks on phone: More than three times a week    Gets together: More than three times a week    Attends religious service: Never    Active member of club or organization: Yes    Attends meetings of clubs or organizations: More than 4 times per year    Relationship status: Never married  Other Topics Concern  . Not on file  Social History Narrative   Patient is single and lives at home, his daughter lives with him.   Patient has three children.   Patient is disabled.   Patient has a 10 grade education.   Patient is right-handed.   Patient drinks 1 or 2 sodas and tea per week.   Depression screen [facility] 2/9 10/31/2018 10/07/2017 04/04/2017 02/12/2017 12/21/2016  Decreased Interest 0 0 0 0 0  Down, Depressed, Hopeless 0 0 0 0 0  PHQ - 2 Score 0 0 0 0 0  Some recent data might be hidden    ROS As noted in HPI  Objective:  BP 139/86   Pulse 80   Temp 98.1 F (36.7 C) (Oral)   Resp 20   SpO2 95%  Physical Exam Constitutional:      General: He is not in acute distress.    Appearance: He is well-developed. He is not diaphoretic.     Comments: In wheelchair, can transfer on own  HENT:     Head: Normocephalic and atraumatic.  Eyes:     General: No scleral icterus.    Conjunctiva/sclera: Conjunctivae normal.     Pupils: Pupils are equal, round, and reactive to light.  Neck:     Musculoskeletal: Normal  range of motion and neck supple.     Thyroid:  No thyromegaly.  Cardiovascular:     Rate and Rhythm: Normal rate and regular rhythm.     Heart sounds: Normal heart sounds.  Pulmonary:     Effort: Pulmonary effort is normal. No respiratory distress.     Breath sounds: Normal breath sounds.  Lymphadenopathy:     Cervical: No cervical adenopathy.  Skin:    General: Skin is warm and dry.  Neurological:     Mental Status: He is alert and oriented to person, place, and time.  Psychiatric:        Behavior: Behavior normal.     [facility] Office Visit on 10/31/2018  Component Date Value Ref Range Status  . Glucose 10/31/2018 155* 65 - 99 mg/dL Final  . BUN 10/31/2018 15  6 - 24 mg/dL Final  . Creatinine, Ser 10/31/2018 0.82  0.76 - 1.27 mg/dL Final  . GFR calc non Af Amer 10/31/2018 102  >59 mL/min/1.73 Final  . GFR calc Af Amer 10/31/2018 118  >59 mL/min/1.73 Final  . BUN/Creatinine Ratio 10/31/2018 18  9 - 20 Final  . Sodium 10/31/2018 141  134 - 144 mmol/L Final  . Potassium 10/31/2018 4.3  3.5 - 5.2 mmol/L Final  . Chloride 10/31/2018 101  96 - 106 mmol/L Final  . CO2 10/31/2018 24  20 - 29 mmol/L Final  . Calcium 10/31/2018 9.1  8.7 - 10.2 mg/dL Final  . Total Protein 10/31/2018 7.4  6.0 - 8.5 g/dL Final  . Albumin 10/31/2018 4.2  3.5 - 5.5 g/dL Final  . Globulin, Total 10/31/2018 3.2  1.5 - 4.5 g/dL Final  . Albumin/Globulin Ratio 10/31/2018 1.3  1.2 - 2.2 Final  . Bilirubin Total 10/31/2018 0.7  0.0 - 1.2 mg/dL Final  . Alkaline Phosphatase 10/31/2018 75  39 - 117 IU/L Final  . AST 10/31/2018 32  0 - 40 IU/L Final  . ALT 10/31/2018 35  0 - 44 IU/L Final  . Hgb A1c MFr Bld 10/31/2018 8.1* 4.8 - 5.6 % Final   Comment:          Prediabetes: 5.7 - 6.4          Diabetes: >6.4          Glycemic control for adults with diabetes: <7.0   . Est. average glucose Bld gHb Est-m* 10/31/2018 186  mg/dL Final  . Color, UA 10/31/2018 yellow* yellow Final  . Clarity, UA 10/31/2018 clear   clear Final  . Glucose, UA 10/31/2018 =100* negative mg/dL Final  . Bilirubin, UA 10/31/2018 negative  negative Final  . Ketones, POC UA 10/31/2018 negative  negative mg/dL Final  . Spec Grav, UA 10/31/2018 1.025  1.010 - 1.025 Final  . Blood, UA 10/31/2018 negative  negative Final  . pH, UA 10/31/2018 6.5  5.0 - 8.0 Final  . Protein Ur, POC 10/31/2018 =30* negative mg/dL Final  . Urobilinogen, UA 10/31/2018 1.0  0.2 or 1.0 E.U./dL Final  . Nitrite, UA 10/31/2018 Negative  Negative Final  . Leukocytes, UA 10/31/2018 Negative  Negative Final  . Cholesterol, Total 10/31/2018 155  100 - 199 mg/dL Final  . Triglycerides 10/31/2018 205* 0 - 149 mg/dL Final  . HDL 10/31/2018 37* >39 mg/dL Final  . VLDL Cholesterol Cal 10/31/2018 41* 5 - 40 mg/dL Final  . LDL Calculated 10/31/2018 77  0 - 99 mg/dL Final  . Chol/HDL Ratio 10/31/2018 4.2  0.0 - 5.0 ratio Final   Comment:  T. Chol/HDL Ratio                                             Men  Women                               1/2 Avg.Risk  3.4    3.3                                   Avg.Risk  5.0    4.4                                2X Avg.Risk  9.6    7.1                                3X Avg.Risk 23.4   11.0   Anti-coag visit on 10/30/2018  Component Date Value Ref Range Status  . INR 10/30/2018 4.5* 2.0 - 3.0 Final    Dg Lumbar Spine 2-3 Views  Result Date: 10/31/2018 CLINICAL DATA:  Low back pain radiating to the right lower extremity. EXAM: LUMBAR SPINE - 2-3 VIEW COMPARISON:  None. FINDINGS: There is no evidence of lumbar spine fracture. Alignment is normal. Moderate to severe multilevel osteoarthritic changes and posterior facet arthropathy. IVC filter in place.  Postsurgical changes right upper quadrant. IMPRESSION: Moderate to severe multilevel osteoarthritic changes of the lumbosacral spine. Electronically Signed   By: Fidela Salisbury M.D.   On: 10/31/2018 13:18   Dg Hip Unilat W Or W/o Pelvis 2-3  Views Right  Result Date: 10/31/2018 CLINICAL DATA:  Low back pain and right hip pain. EXAM: DG HIP (WITH OR WITHOUT PELVIS) 2-3V RIGHT COMPARISON:  None. FINDINGS: There is no evidence of hip fracture or dislocation. Mild osteoarthritic changes of the right hip with axial joint space narrowing and subchondral sclerosis predominantly in the acetabulum. IMPRESSION: Mild osteoarthritic changes of the right hip. Electronically Signed   By: Fidela Salisbury M.D.   On: 10/31/2018 13:20    Assessment & Plan:   1. Essential hypertension   2. Prediabetes   3. Transient weakness of right lower extremity   4. Chronic right hip pain   5. Chronic bilateral low back pain with right-sided sciatica   6. Disease of spinal cord [facility])     Patient will continue on current chronic medications other than changes noted above, so ok to refill when needed.   See after visit summary for patient specific instructions.  Orders Placed This Encounter  Procedures  . DG Lumbar Spine 2-3 Views    Standing Status:   Future    Number of Occurrences:   1    Standing Expiration Date:   10/31/2019    Order Specific Question:   Reason for Exam (SYMPTOM  OR DIAGNOSIS REQUIRED)    Answer:   low back pain radiating right down right lower ext - whenever lays flat in bed, develops pain 5-10 min then becomes numb and looses all strength, sensation and strength return after sitting up for a time, right groin pain    Order Specific Question:   Preferred imaging  location?    Answer:   External  . DG HIP UNILAT W OR W/O PELVIS 2-3 VIEWS RIGHT    Standing Status:   Future    Number of Occurrences:   1    Standing Expiration Date:   10/31/2019    Order Specific Question:   Reason for Exam (SYMPTOM  OR DIAGNOSIS REQUIRED)    Answer:   low back pain radiating right down right lower ext - whenever lays flat in bed, develops pain 5-10 min then becomes numb and looses all strength, sensation and strength return after sitting up for a  time, right groin pain    Order Specific Question:   Preferred imaging location?    Answer:   External  . MR LUMBAR SPINE W CONTRAST    Standing Status:   Future    Standing Expiration Date:   01/02/2020    Scheduling Instructions:     Please give pt a CD of the MRI to bring with him to his consult appt with Dr. Ron Agee at [facility] Specific Question:   If indicated for the ordered procedure, I authorize the administration of contrast media per Radiology protocol    Answer:   Yes    Order Specific Question:   What is the patient's sedation requirement?    Answer:   No Sedation    Order Specific Question:   Does the patient have a pacemaker or implanted devices?    Answer:   No    Comments:   has had multiple prior c-spine and l-spine surgeries    Order Specific Question:   Radiology Contrast Protocol - do NOT remove file path    Answer:   \\[email or web address]    Order Specific Question:   Preferred imaging location?    Answer:   [facility] (table limit-550lbs)  . Flu Vaccine QUAD 36+ mos IM  . Comprehensive metabolic panel  . Hemoglobin A1c  . Lipid panel    Order Specific Question:   Has the patient fasted?    Answer:   Yes  . Ambulatory referral to Orthopedic Surgery    Referral Priority:   Routine    Referral Type:   Surgical    Referral Reason:   Specialty Services Required    Requested Specialty:   Orthopedic Surgery    Number of Visits Requested:   1  . POCT urinalysis dipstick      Patient verbalized to me that they understand the following: diagnosis, what is being done for them, what to expect and what should be done at home.  Their questions have been answered. They understand that I am unable to predict every possible medication interaction or adverse outcome and that if any unexpected symptoms arise, they should contact us and their pharmacist, as well as never hesitate to seek urgent/emergent care at [facility] or ER if  they think it might be warranted.    [name], MD, MPH Primary Care at [place], [place]  [address] [phone] Office phone  [phone] Office fax  10/31/18 12:16 PM

## 2018-11-01 LAB — COMPREHENSIVE METABOLIC PANEL
ALT: 35 IU/L (ref 0–44)
AST: 32 IU/L (ref 0–40)
Albumin/Globulin Ratio: 1.3 (ref 1.2–2.2)
Albumin: 4.2 g/dL (ref 3.5–5.5)
Alkaline Phosphatase: 75 IU/L (ref 39–117)
BUN/Creatinine Ratio: 18 (ref 9–20)
BUN: 15 mg/dL (ref 6–24)
Bilirubin Total: 0.7 mg/dL (ref 0.0–1.2)
CO2: 24 mmol/L (ref 20–29)
Calcium: 9.1 mg/dL (ref 8.7–10.2)
Chloride: 101 mmol/L (ref 96–106)
Creatinine, Ser: 0.82 mg/dL (ref 0.76–1.27)
GFR calc Af Amer: 118 mL/min/{1.73_m2} (ref >59)
GFR calc non Af Amer: 102 mL/min/{1.73_m2} (ref >59)
Globulin, Total: 3.2 g/dL (ref 1.5–4.5)
Glucose: 155 mg/dL — ABNORMAL HIGH (ref 65–99)
Potassium: 4.3 mmol/L (ref 3.5–5.2)
Sodium: 141 mmol/L (ref 134–144)
Total Protein: 7.4 g/dL (ref 6.0–8.5)

## 2018-11-01 LAB — LIPID PANEL
Chol/HDL Ratio: 4.2 ratio (ref 0.0–5.0)
Cholesterol, Total: 155 mg/dL (ref 100–199)
HDL: 37 mg/dL — ABNORMAL LOW (ref >39)
LDL Calculated: 77 mg/dL (ref 0–99)
Triglycerides: 205 mg/dL — ABNORMAL HIGH (ref 0–149)
VLDL Cholesterol Cal: 41 mg/dL — ABNORMAL HIGH (ref 5–40)

## 2018-11-01 LAB — HEMOGLOBIN A1C
Est. average glucose Bld gHb Est-mCnc: 186 mg/dL
Hgb A1c MFr Bld: 8.1 % — ABNORMAL HIGH (ref 4.8–5.6)

## 2018-11-04 ENCOUNTER — Telehealth: Payer: Self-pay | Admitting: Family Medicine

## 2018-11-04 NOTE — Telephone Encounter (Signed)
Copied from [place] [id number]. Topic: General - Other >> Nov 04, 2018 11:50 AM Cecelia Byars, NT wrote: Reason for CRM: [facility]  called and said the patient order need to changed to with or with out  contrast ,

## 2018-11-05 ENCOUNTER — Telehealth: Payer: Self-pay | Admitting: Family Medicine

## 2018-11-05 NOTE — Telephone Encounter (Signed)
Pt mri was placed for medical review through his insurance, which they are requesting the notes, labs etc. from 10/31/18 visit, which the notes are incomplete if we can get those completed so they can be sent off for his mri to be covered through his insurance. Thanks

## 2018-11-05 NOTE — Telephone Encounter (Signed)
Please advise per [facility] order needs to be changed.

## 2018-11-06 ENCOUNTER — Telehealth: Payer: Self-pay | Admitting: Family Medicine

## 2018-11-06 DIAGNOSIS — G959 Disease of spinal cord, unspecified: Secondary | ICD-10-CM

## 2018-11-06 NOTE — Telephone Encounter (Signed)
Spoke to [employer] [id number] valid 11/06/2018-12/06/2018    Copied from [place] [id number]. Topic: General - Other >> Nov 06, 2018  9:38 AM Yvette Rack wrote: Reason for CRM: Verdis Frederickson with [facility] stated they need the prior authorization information completed for the MRI. Verdis Frederickson stated this matter is time sensitive and they would need the information within 24 - 48 hours.  Verdis Frederickson stated she will also fax a request for the information and it should be faxed back to [phone]. Cb# [phone]

## 2018-11-07 NOTE — Telephone Encounter (Signed)
Please change orders.

## 2018-11-13 ENCOUNTER — Ambulatory Visit (INDEPENDENT_AMBULATORY_CARE_PROVIDER_SITE_OTHER): Payer: Medicare HMO | Admitting: *Deleted

## 2018-11-13 DIAGNOSIS — R600 Localized edema: Secondary | ICD-10-CM

## 2018-11-13 DIAGNOSIS — Z7901 Long term (current) use of anticoagulants: Secondary | ICD-10-CM

## 2018-11-13 DIAGNOSIS — Z86711 Personal history of pulmonary embolism: Secondary | ICD-10-CM

## 2018-11-13 LAB — POCT INR: INR: 4.4 — AB (ref 2.0–3.0)

## 2018-11-13 NOTE — Telephone Encounter (Signed)
Freda Munro calling back to check status of order change request. Freda Munro states patient cannot be scheduled without order being changed to with or with out contrast. Please advise. Freda Munro is requesting a call back today, if possible.    Freda Munro cb# [phone] Ext: [phone]

## 2018-11-13 NOTE — Patient Instructions (Signed)
Description   Skip tomorrow's dose, then start taking 1/2 tablet everyday except 1 tablet on Sundays, Tuesdays and Thursdays.  Recheck in 2 weeks. Call us with any changes # [phone].

## 2018-11-13 NOTE — Telephone Encounter (Signed)
Nicolas [name] from [facility] called and stated that pt mri needs to be changed to with and without contact or without contrast because they don't do just with contrast. They have been trying to get this changed since dec 10. Once this is changed we are going to have to get the auth changed through the insurance. Please, please advise!

## 2018-11-14 ENCOUNTER — Other Ambulatory Visit (HOSPITAL_COMMUNITY): Payer: Self-pay | Admitting: Sports Medicine

## 2018-11-14 ENCOUNTER — Other Ambulatory Visit: Payer: Self-pay | Admitting: Sports Medicine

## 2018-11-14 DIAGNOSIS — M545 Low back pain, unspecified: Secondary | ICD-10-CM

## 2018-11-14 NOTE — Telephone Encounter (Signed)
Order changed. This is a duplicate phone note to one already routed in which the order was changed

## 2018-11-14 NOTE — Telephone Encounter (Signed)
Please see if pt still needs

## 2018-11-14 NOTE — Telephone Encounter (Signed)
Signed off yest and new mri order entered that is correct (w/ AND w/o contrast

## 2018-11-14 NOTE — Telephone Encounter (Signed)
MRI order change requested

## 2018-11-14 NOTE — Telephone Encounter (Signed)
I called [facility] and let them know that a new order was placed in computer for patient to have MRI W/WO contrast

## 2018-11-14 NOTE — Telephone Encounter (Signed)
New order entered.  Orders Placed This Encounter  Procedures  . MR Lumbar Spine W Wo Contrast    Standing Status:   Future    Standing Expiration Date:   01/16/2020    Scheduling Instructions:     Please give pt a CD of the MRI to bring with him to his consult appt with Dr. Ron Agee at [facility] Specific Question:   ** REASON FOR EXAM (FREE TEXT)    Answer:   low back pain radiating right down right lower ext - whenever lays flat in bed, develops pain 5-10 min then becomes numb and looses all strength, sensation and strength return after sitting up for a time, right groin pain    Order Specific Question:   If indicated for the ordered procedure, I authorize the administration of contrast media per Radiology protocol    Answer:   Yes    Order Specific Question:   What is the patient's sedation requirement?    Answer:   No Sedation    Order Specific Question:   Does the patient have a pacemaker or implanted devices?    Answer:   No    Comments:   has had multiple prior c-spine and l-spine surgeries    Order Specific Question:   Radiology Contrast Protocol - do NOT remove file path    Answer:   \\[email or web address]    Order Specific Question:   Preferred imaging location?    Answer:   [facility] (table limit-550lbs)

## 2018-11-17 ENCOUNTER — Telehealth: Payer: Self-pay | Admitting: Family Medicine

## 2018-11-17 NOTE — Telephone Encounter (Signed)
I have noticed Dr. Alfonso Ramus placed the same MR as Nicolas [name] but wo contrast only. And its already sch. I highly doubt that pt insurance will cover for him to have 2 mri's. Should I proceed with the one shaw placed w/wo or cancel and let pt have the one that is already sch the w/o? Please advise

## 2018-11-20 ENCOUNTER — Other Ambulatory Visit: Payer: Self-pay | Admitting: Family Medicine

## 2018-11-20 DIAGNOSIS — I10 Essential (primary) hypertension: Secondary | ICD-10-CM

## 2018-11-25 NOTE — Telephone Encounter (Signed)
Definitely defer to Dr. Alfonso Ramus - I am assuming he is pt's neurosurgeon. Thanks

## 2018-11-27 ENCOUNTER — Ambulatory Visit (INDEPENDENT_AMBULATORY_CARE_PROVIDER_SITE_OTHER): Payer: Medicare HMO

## 2018-11-27 DIAGNOSIS — R600 Localized edema: Secondary | ICD-10-CM | POA: Diagnosis not present

## 2018-11-27 DIAGNOSIS — Z86711 Personal history of pulmonary embolism: Secondary | ICD-10-CM | POA: Diagnosis not present

## 2018-11-27 DIAGNOSIS — Z7901 Long term (current) use of anticoagulants: Secondary | ICD-10-CM

## 2018-11-27 LAB — POCT INR: INR: 1.8 — AB (ref 2.0–3.0)

## 2018-11-27 NOTE — Patient Instructions (Addendum)
Description   Take an extra 1/2 tablet today, then start taking 1 tablet everyday except 1/2 tablet on Mondays, Wednesdays and Fridays.  Recheck in 2 weeks. Call us with any changes # [phone].

## 2018-12-01 ENCOUNTER — Ambulatory Visit (HOSPITAL_COMMUNITY)
Admission: RE | Admit: 2018-12-01 | Discharge: 2018-12-01 | Disposition: A | Discharge status: Home / Self Care | Payer: Medicare HMO | Source: Ambulatory Visit | Attending: Sports Medicine | Admitting: Sports Medicine

## 2018-12-01 DIAGNOSIS — M545 Low back pain, unspecified: Secondary | ICD-10-CM

## 2018-12-01 DIAGNOSIS — M48061 Spinal stenosis, lumbar region without neurogenic claudication: Secondary | ICD-10-CM | POA: Diagnosis not present

## 2018-12-01 NOTE — Telephone Encounter (Signed)
Called pt to follow-up and pt states he no longer needs letter. Will call office back if he has anymore concerns.

## 2018-12-03 ENCOUNTER — Telehealth: Payer: Self-pay | Admitting: Family Medicine

## 2018-12-03 DIAGNOSIS — M545 Low back pain: Secondary | ICD-10-CM | POA: Diagnosis not present

## 2018-12-03 NOTE — Telephone Encounter (Signed)
Called and spoke with pt regarding their appt that was scheduled on 1.10.20 with [name]. Due to Brigitte Pulse being on leave, I was able to get the pt rescheduled for 2.18.20 with Brigitte Pulse. I advised pt of time and late policy. Pt acknowledged.

## 2018-12-05 ENCOUNTER — Ambulatory Visit: Payer: Medicare HMO | Admitting: Family Medicine

## 2018-12-09 ENCOUNTER — Ambulatory Visit: Payer: Medicare HMO | Admitting: Family Medicine

## 2018-12-11 ENCOUNTER — Ambulatory Visit (INDEPENDENT_AMBULATORY_CARE_PROVIDER_SITE_OTHER): Payer: Medicare HMO | Admitting: Pharmacist

## 2018-12-11 DIAGNOSIS — Z86711 Personal history of pulmonary embolism: Secondary | ICD-10-CM

## 2018-12-11 DIAGNOSIS — Z7901 Long term (current) use of anticoagulants: Secondary | ICD-10-CM

## 2018-12-11 DIAGNOSIS — R600 Localized edema: Secondary | ICD-10-CM

## 2018-12-11 LAB — POCT INR: INR: 3.1 — AB (ref 2.0–3.0)

## 2018-12-11 NOTE — Patient Instructions (Signed)
Description   Do not take warfarin tomorrow, then continue taking 1 tablet everyday except 1/2 tablet on Mondays, Wednesdays and Fridays.  Recheck in 3 weeks. Call us with any changes # [phone].

## 2018-12-16 DIAGNOSIS — M48062 Spinal stenosis, lumbar region with neurogenic claudication: Secondary | ICD-10-CM | POA: Diagnosis not present

## 2018-12-16 DIAGNOSIS — M542 Cervicalgia: Secondary | ICD-10-CM | POA: Diagnosis not present

## 2018-12-16 DIAGNOSIS — G8220 Paraplegia, unspecified: Secondary | ICD-10-CM | POA: Diagnosis not present

## 2018-12-16 DIAGNOSIS — R292 Abnormal reflex: Secondary | ICD-10-CM | POA: Diagnosis not present

## 2018-12-16 DIAGNOSIS — R200 Anesthesia of skin: Secondary | ICD-10-CM | POA: Diagnosis not present

## 2018-12-16 DIAGNOSIS — M4316 Spondylolisthesis, lumbar region: Secondary | ICD-10-CM | POA: Diagnosis not present

## 2018-12-25 ENCOUNTER — Other Ambulatory Visit (HOSPITAL_COMMUNITY): Payer: Self-pay | Admitting: Neurosurgery

## 2018-12-25 DIAGNOSIS — R200 Anesthesia of skin: Secondary | ICD-10-CM

## 2018-12-25 DIAGNOSIS — G8220 Paraplegia, unspecified: Secondary | ICD-10-CM

## 2019-01-06 ENCOUNTER — Telehealth: Payer: Self-pay | Admitting: Family Medicine

## 2019-01-06 NOTE — Telephone Encounter (Signed)
Spoke with pt regarding appt scheduled for 2/18 with [name]. Due to [name] being on leave, pt will need to be rescheduled. I was able to get pt rescheduled for 2/17 at 9:40 am. I advised pt of time, building number and late policy. Pt acknowledged.

## 2019-01-12 ENCOUNTER — Other Ambulatory Visit: Payer: Self-pay

## 2019-01-12 ENCOUNTER — Encounter: Payer: Self-pay | Admitting: Family Medicine

## 2019-01-12 ENCOUNTER — Ambulatory Visit (INDEPENDENT_AMBULATORY_CARE_PROVIDER_SITE_OTHER): Payer: Medicare HMO | Admitting: Family Medicine

## 2019-01-12 VITALS — BP 151/85 | HR 86 | Temp 98.5°F | Resp 17 | Ht 70.0 in | Wt 310.0 lb

## 2019-01-12 DIAGNOSIS — D171 Benign lipomatous neoplasm of skin and subcutaneous tissue of trunk: Secondary | ICD-10-CM | POA: Diagnosis not present

## 2019-01-12 DIAGNOSIS — L988 Other specified disorders of the skin and subcutaneous tissue: Secondary | ICD-10-CM | POA: Diagnosis not present

## 2019-01-12 DIAGNOSIS — N6001 Solitary cyst of right breast: Secondary | ICD-10-CM

## 2019-01-12 NOTE — Patient Instructions (Addendum)
If you have lab work done today you will be contacted with your lab results within the next 2 weeks.  If you have not heard from [place] then please contact us. The fastest way to get your results is to register for My Chart.   IF you received an x-ray today, you will receive an invoice from [facility]. Please contact [facility] at [phone] with questions or concerns regarding your invoice.   IF you received labwork today, you will receive an invoice from [facility]. Please contact [facility] at [phone] with questions or concerns regarding your invoice.   Our billing staff will not be able to assist you with questions regarding bills from these companies.  You will be contacted with the lab results as soon as they are available. The fastest way to get your results is to activate your My Chart account. Instructions are located on the last page of this paperwork. If you have not heard from [place] regarding the results in 2 weeks, please contact this office.     Lipoma Removal, Care After Refer to this sheet in the next few weeks. These instructions provide you with information about caring for yourself after your procedure. Your health care provider may also give you more specific instructions. Your treatment has been planned according to current medical practices, but problems sometimes occur. Call your health care provider if you have any problems or questions after your procedure. What can I expect after the procedure? After the procedure, it is common to have:  Mild pain.  Swelling.  Bruising. Follow these instructions at home:  Bathing  Do not take baths, swim, or use a hot tub until your health care provider approves. Ask your health care provider if you can take showers. You may only be allowed to take sponge baths for bathing.  Keep your bandage (dressing) dry until your health care provider says it can be removed. Incision care   Follow instructions  from your health care provider about how to take care of your incision. Make sure you: ? Wash your hands with soap and water before you change your bandage (dressing). If soap and water are not available, use hand sanitizer. ? Change your dressing as told by your health care provider. ? Leave stitches (sutures), skin glue, or adhesive strips in place. These skin closures may need to stay in place for 2 weeks or longer. If adhesive strip edges start to loosen and curl up, you may trim the loose edges. Do not remove adhesive strips completely unless your health care provider tells you to do that.  Check your incision area every day for signs of infection. Check for: ? More redness, swelling, or pain. ? Fluid or blood. ? Warmth. ? Pus or a bad smell. Driving  Do not drive or operate heavy machinery while taking prescription pain medicine.  Do not drive for 24 hours if you received a medicine to help you relax (sedative) during your procedure.  Ask your health care provider when it is safe for you to drive. General instructions  Take over-the-counter and prescription medicines only as told by your health care provider.  Do not use any tobacco products, such as cigarettes, chewing tobacco, and e-cigarettes. These can delay healing. If you need help quitting, ask your health care provider.  Return to your normal activities as told by your health care provider. Ask your health care provider what activities are safe for you.  Keep all follow-up visits as told  by your health care provider. This is important. Contact a health care provider if:  You have more redness, swelling, or pain around your incision.  You have fluid or blood coming from your incision.  Your incision feels warm to the touch.  You have pus or a bad smell coming from your incision.  You have pain that does not get better with medicine. Get help right away if:  You have chills or a fever.  You have severe pain. This  information is not intended to replace advice given to you by your health care provider. Make sure you discuss any questions you have with your health care provider. Document Released: 01/26/2016 Document Revised: 04/24/2016 Document Reviewed: 01/26/2016 Elsevier Interactive Patient Education  2019 [employer].

## 2019-01-12 NOTE — Progress Notes (Signed)
Established Patient Office Visit  Subjective:  Patient ID: Nicolas [name], male    DOB: 09-22-1966  Age: 53 y.o. MRN: [id number]  CC:  Chief Complaint  Patient presents with  . cyst removal for right breast    HPI Nicolas [name] presents for   This is a patient of Dr. Malachy Mood who presents today for right breast cyst.  Denies pain from the area but states that it fluctuates in size.  He notices that is below the nipple and stays there consistently.  He denies any similar cysts in the armpit or groin.  There is no nipple discharge.  No fevers or chills.  No edema of the skin.  Past Medical History:  Diagnosis Date  . Arthritis   . Clotting disorder ([place])    pulmonary embolus, DVT  . Depression   . Diabetes mellitus without complication (HCC)    no meds, watching diet  . DVT (deep venous thrombosis) ([place])    Summer 2012  . History of home oxygen therapy    uses mouthpiece ventilation with 2 liters supplemental oxygen all the time  . Hypertension   . Neuromuscular disorder ([place])    spinal stenosis.- pt walks with a walker short distance  . Pulmonary embolism ([place])    x 3 in 2005, 2008, 2010  . Sleep apnea    does not wear a c-pap  . Spinal stenosis     Past Surgical History:  Procedure Laterality Date  . CHOLECYSTECTOMY N/A 05/24/2014   Procedure: LAPAROSCOPIC CHOLECYSTECTOMY;  Surgeon: Zenovia Jarred, MD;  Location: [facility];  Service: General;  Laterality: N/A;  . ERCP N/A 05/25/2014   Procedure: ENDOSCOPIC RETROGRADE CHOLANGIOPANCREATOGRAPHY (ERCP);  Surgeon: Beryle Beams, MD;  Location: [facility];  Service: Endoscopy;  Laterality: N/A;  . ERCP N/A 07/16/2014   Procedure: ENDOSCOPIC RETROGRADE CHOLANGIOPANCREATOGRAPHY (ERCP);  Surgeon: Beryle Beams, MD;  Location: [facility];  Service: Endoscopy;  Laterality: N/A;  . ivp filter  jan 2012  . SPINE SURGERY  11/30/11   Total 6 back surgeries  . SPINE SURGERY  [id number]   Dr. Cathren Laine in [place], [place]  . [place]      08/2003, cervial spine 2005, 2010 lower back, 2011 lower back, 2012 & 2013  . UPPER GASTROINTESTINAL ENDOSCOPY    . WISDOM TOOTH EXTRACTION     2 removed    Family History  Problem Relation Age of Onset  . Cancer Mother 11       Lung  . Diabetes Father   . Prostate cancer Father   . Hypertension Father   . Hypertension Brother   . Hypertension Brother   . Diabetes Brother   . Heart attack Neg Hx   . Stroke Neg Hx   . Colon cancer Neg Hx   . Esophageal cancer Neg Hx   . Pancreatic cancer Neg Hx   . Rectal cancer Neg Hx   . Stomach cancer Neg Hx     Social History   Socioeconomic History  . Marital status: Single    Spouse name: Not on file  . Number of children: 3  . Years of education: 10  . Highest education level: 10th grade  Occupational History  . Occupation: Disability  Social Needs  . Financial resource strain: Not hard at all  . Food insecurity:    Worry: Never true    Inability: Never true  . Transportation needs:    Medical: No    Non-medical: No  Tobacco Use  . Smoking status: Former Smoker    Packs/day: 1.50    Years: 10.00    Pack years: 15.00    Types: Cigarettes    Last attempt to quit: 04/26/2001    Years since quitting: 17.7  . Smokeless tobacco: Never Used  Substance and Sexual Activity  . Alcohol use: Yes    Alcohol/week: 1.0 standard drinks    Types: 1 Standard drinks or equivalent per week    Comment: very little  . Drug use: No  . Sexual activity: Yes  Lifestyle  . Physical activity:    Days per week: 6 days    Minutes per session: 50 min  . Stress: Not at all  Relationships  . Social connections:    Talks on phone: More than three times a week    Gets together: More than three times a week    Attends religious service: Never    Active member of club or organization: Yes    Attends meetings of clubs or organizations: More than 4 times per year    Relationship status: Never married  . Intimate partner violence:    Fear of  current or ex partner: No    Emotionally abused: No    Physically abused: No    Forced sexual activity: No  Other Topics Concern  . Not on file  Social History Narrative   Patient is single and lives at home, his daughter lives with him.   Patient has three children.   Patient is disabled.   Patient has a 10 grade education.   Patient is right-handed.   Patient drinks 1 or 2 sodas and tea per week.    Outpatient Medications Prior to Visit  Medication Sig Dispense Refill  . furosemide (LASIX) 40 MG tablet Take 1 tablet (40 mg total) by mouth daily. 90 tablet 3  . metoprolol succinate (TOPROL-XL) 50 MG 24 hr tablet TAKE 1 TABLET EVERY EVENING WITH OR IMMEDIATELY FOLLOWING A MEAL 90 tablet 0  . Multiple Vitamin (MULTIVITAMIN WITH MINERALS) TABS tablet Take 1 tablet by mouth daily. Reported on 11/09/2015    . potassium chloride SA (K-DUR,KLOR-CON) 20 MEQ tablet Take 1 tablet (20 mEq total) by mouth as needed (with lasix). 90 tablet 3  . warfarin (COUMADIN) 10 MG tablet Take as directed by coumadin clinic 90 tablet 0   Facility-Administered Medications Prior to Visit  Medication Dose Route Frequency Provider Last Rate Last Dose  . 0.9 %  sodium chloride infusion  500 mL Intravenous Continuous Pyrtle, Lajuan Lines, MD        Allergies  Allergen Reactions  . Lisinopril Other (See Comments)    cough  . Ace Inhibitors Other (See Comments)    cough    ROS Review of Systems See hpi    Objective:    Physical Exam  BP (!) 151/85 (BP Location: Left Arm, Patient Position: Sitting, Cuff Size: Large)   Pulse 86   Temp 98.5 F (36.9 C) (Oral)   Resp 17   Ht 5\' 10"  (1.778 m)   Wt (!) 310 lb (140.6 kg)   SpO2 93%   BMI 44.48 kg/m  Wt Readings from Last 3 Encounters:  01/12/19 (!) 310 lb (140.6 kg)  05/07/18 (!) 345 lb (156.5 kg)  11/07/17 (!) 312 lb (141.5 kg)   Physical Exam  Constitutional: Oriented to person, place, and time. Appears well-developed and well-nourished. Wheel chair  bound HENT:  Head: Normocephalic and atraumatic.  Eyes: Conjunctivae and  EOM are normal.  Pulmonary/Chest: Effort normal Neurological: Is alert and oriented to person, place, and time.  Skin: Skin is warm. Capillary refill takes less than 2 seconds.  Psychiatric: Has a normal mood and affect. Behavior is normal. Judgment and thought content normal.    Procedure note  Patient examined and noted to have right breast cyst inferior to the nipple with approximate size of 1/2 cm x 1 cm without fluctuance or erythema. Discussed with the patient options for treatment and further analysis as to what fluid is within the cyst.  Discussed risk and benefits.  Patient agreeable to moving towards biopsy.  Questions elicited and answered informed consent given.  Timeout performed.  Betadine the skin was cleaned.  Anesthesia achieved with lidocaine with epi 6 cc injected subcutaneously.  Using an 11 blade scalpel the skin was opened and using blunt dissection the capsule of the cyst was opened which revealed cheesy malodorous substance that was collected and sent to pathology.  Further blunt dissection was performed with some bleeding from the skin thus the procedure was stopped and pressure was applied.  Approximate blood loss about 5 to 10 cc.  After pressure was applied and good hemostasis was achieved the area was dressed with Steri-Strips and a pressure dressing applied.  Patient tolerated procedure well there were no complications.  Health Maintenance Due  Topic Date Due  . URINE MICROALBUMIN  10/07/2018    There are no preventive care reminders to display for this patient.  Lab Results  Component Value Date   TSH 2.150 04/04/2017   Lab Results  Component Value Date   WBC 7.1 05/07/2018   HGB 14.1 05/07/2018   HCT 42.9 05/07/2018   MCV 92 05/07/2018   PLT 194 05/07/2018   Lab Results  Component Value Date   NA 141 10/31/2018   K 4.3 10/31/2018   CO2 24 10/31/2018   GLUCOSE 155 (H)  10/31/2018   BUN 15 10/31/2018   CREATININE 0.82 10/31/2018   BILITOT 0.7 10/31/2018   ALKPHOS 75 10/31/2018   AST 32 10/31/2018   ALT 35 10/31/2018   PROT 7.4 10/31/2018   ALBUMIN 4.2 10/31/2018   CALCIUM 9.1 10/31/2018   ANIONGAP 8 05/27/2014   GFR 159.63 08/03/2015   Lab Results  Component Value Date   CHOL 155 10/31/2018   Lab Results  Component Value Date   HDL 37 (L) 10/31/2018   Lab Results  Component Value Date   LDLCALC 77 10/31/2018   Lab Results  Component Value Date   TRIG 205 (H) 10/31/2018   Lab Results  Component Value Date   CHOLHDL 4.2 10/31/2018   Lab Results  Component Value Date   HGBA1C 8.1 (H) 10/31/2018      Assessment & Plan:   Problem List Items Addressed This Visit    None    Visit Diagnoses    Cyst of right breast    -  Primary   Relevant Orders   Dermatology pathology   Ambulatory referral to General Surgery   Lipoma of breast       Relevant Orders   Ambulatory referral to General Surgery     Findings consistent with lipoma.  Will plan for patient to follow-up with general surgery for complete excision of cyst capsule to prevent recurrence. Patient is agreeable with this plan. He was advised to leave the dressing on for 2 days.   No orders of the defined types were placed in this encounter.   Follow-up:  Return for follow up with General Surgery.    Forrest Moron, MD

## 2019-01-13 ENCOUNTER — Ambulatory Visit: Payer: Medicare HMO | Admitting: Family Medicine

## 2019-01-14 ENCOUNTER — Ambulatory Visit (HOSPITAL_COMMUNITY): Payer: Medicare HMO

## 2019-01-14 ENCOUNTER — Ambulatory Visit (HOSPITAL_COMMUNITY)
Admission: RE | Admit: 2019-01-14 | Discharge: 2019-01-14 | Disposition: A | Discharge status: Home / Self Care | Payer: Medicare HMO | Source: Ambulatory Visit | Attending: Neurosurgery | Admitting: Neurosurgery

## 2019-01-14 DIAGNOSIS — M4802 Spinal stenosis, cervical region: Secondary | ICD-10-CM | POA: Diagnosis not present

## 2019-01-14 DIAGNOSIS — M4804 Spinal stenosis, thoracic region: Secondary | ICD-10-CM | POA: Diagnosis not present

## 2019-01-14 DIAGNOSIS — G8220 Paraplegia, unspecified: Secondary | ICD-10-CM | POA: Diagnosis not present

## 2019-01-14 DIAGNOSIS — R200 Anesthesia of skin: Secondary | ICD-10-CM

## 2019-01-14 DIAGNOSIS — M5124 Other intervertebral disc displacement, thoracic region: Secondary | ICD-10-CM | POA: Diagnosis not present

## 2019-01-14 DIAGNOSIS — M5125 Other intervertebral disc displacement, thoracolumbar region: Secondary | ICD-10-CM | POA: Diagnosis not present

## 2019-01-14 MED ORDER — GADOBUTROL 1 MMOL/ML IV SOLN
10.0000 mL | Freq: Once | INTRAVENOUS | Status: AC | PRN
  Administered 2019-01-14: 10 mL via INTRAVENOUS

## 2019-01-16 ENCOUNTER — Ambulatory Visit (INDEPENDENT_AMBULATORY_CARE_PROVIDER_SITE_OTHER): Payer: Medicare HMO | Admitting: *Deleted

## 2019-01-16 DIAGNOSIS — Z7901 Long term (current) use of anticoagulants: Secondary | ICD-10-CM

## 2019-01-16 DIAGNOSIS — Z86711 Personal history of pulmonary embolism: Secondary | ICD-10-CM

## 2019-01-16 DIAGNOSIS — R600 Localized edema: Secondary | ICD-10-CM

## 2019-01-16 LAB — POCT INR: INR: 3.1 — AB (ref 2.0–3.0)

## 2019-01-16 MED ORDER — WARFARIN SODIUM 10 MG PO TABS: ORAL_TABLET | ORAL | 0 refills | Status: DC

## 2019-01-16 NOTE — Patient Instructions (Signed)
Description   Start taking 1/2 tablet everyday except 1 tablet on Sundays, Tuesdays, and Thursdays.  Recheck in 3 weeks. Call us with any changes # [phone].

## 2019-01-20 ENCOUNTER — Other Ambulatory Visit: Payer: Self-pay | Admitting: Neurosurgery

## 2019-01-20 ENCOUNTER — Telehealth: Payer: Self-pay | Admitting: *Deleted

## 2019-01-20 DIAGNOSIS — M4802 Spinal stenosis, cervical region: Secondary | ICD-10-CM | POA: Diagnosis not present

## 2019-01-20 DIAGNOSIS — M4712 Other spondylosis with myelopathy, cervical region: Secondary | ICD-10-CM | POA: Diagnosis not present

## 2019-01-20 DIAGNOSIS — M4804 Spinal stenosis, thoracic region: Secondary | ICD-10-CM | POA: Diagnosis not present

## 2019-01-20 DIAGNOSIS — I10 Essential (primary) hypertension: Secondary | ICD-10-CM | POA: Diagnosis not present

## 2019-01-20 NOTE — Telephone Encounter (Signed)
Pt takes warfarin for recurrent VTE. He has a history of DVT in 2012 as well as PE in 2005, 2008, 2010.  He has been on Coumadin since 2012.  He had an IVC filter placed in December 2012 when his blood thinner was stopped. He will require bridging with Lovenox in order to hold his warfarin for 5 days prior to procedure. Procedure is in 8 days, will move Coumadin appt to tomorrow to coordinate bridging in clinic with pt.

## 2019-01-20 NOTE — Telephone Encounter (Signed)
   Primary Cardiologist: Lauree Chandler, MD  Chart reviewed as part of pre-operative protocol coverage. Patient was contacted 01/20/2019 in reference to pre-operative risk assessment for pending surgery as outlined below.  RUNE MENDEZ was last seen on 05/07/2018 by myself.  We follow Mr. Farooqui for HTN and chronic coumadin management for repeated pulmonary embolus. He has no hx of CAD or CHF. He does have chronic lower ext edema related to venous insufficiency. He is obese and spinal stenosis limits his activity level. Although he gets around with a wheel chair, he does do daily exercise with squats, leg lifts and other exercises. He also does housework. He is unable to do cardio exercise due to his back issues. He denies any chest discomfort or shortness of breath with exertion. He has no hx of diabetes, renal dysfunction or stroke.   Mr. Beg has had ~7 prior similar back surgeries and has done well with them.   According to the RCRI risk calculator, Mr. Ausburn is a Class I Risk with 0.4% risk of major cardiac event perioperatively. However, given his size and limited mobility he is likely at a slightly increased risk for complications. This was discussed with the patient and he understands.   Therefore, based on ACC/AHA guidelines, the patient would be at acceptable risk for the planned procedure without further cardiovascular testing.   Pt takes warfarin for recurrent VTE. He has a history of DVT in 2012 as well as PE in 2005, 2008, 2010. He has been on Coumadin since 2012. He had an IVC filter placed in December 2012 when his blood thinner was stopped. He will require bridging with Lovenox in order to hold his warfarin for 5 days prior to procedure. Procedure is in 8 days, will move Coumadin appt to tomorrow to coordinate bridging in clinic with pt.  I will route this recommendation to the requesting party via Epic fax function and remove from pre-op pool.  Please call with  questions.  Daune Perch, NP 01/20/2019, 3:22 PM

## 2019-01-20 NOTE — Telephone Encounter (Signed)
   [facility] Pre-operative Risk Assessment    Request for surgical clearance:  1. What type of surgery is being performed? C4 CORPECTOMY W/C3-4, C4-5 INTERBODY FUSION   2. When is this surgery scheduled? 01/28/19   3. What type of clearance is required (medical clearance vs. Pharmacy clearance to hold med vs. Both)? BOTH  4. Are there any medications that need to be held prior to surgery and how long?WARFARIN HOLD 5 DAYS PRIOR  5. Practice name and name of physician performing surgery? [facility]   6. What is your office phone number [phone]    7.   What is your office fax number [phone]  8.   Anesthesia type (None, local, MAC, general) ? GENERAL   Julaine Hua 01/20/2019, 2:14 PM  _________________________________________________________________   (provider comments below)

## 2019-01-21 ENCOUNTER — Ambulatory Visit (INDEPENDENT_AMBULATORY_CARE_PROVIDER_SITE_OTHER): Payer: Medicare HMO | Admitting: Pharmacist

## 2019-01-21 DIAGNOSIS — Z86711 Personal history of pulmonary embolism: Secondary | ICD-10-CM | POA: Diagnosis not present

## 2019-01-21 DIAGNOSIS — Z7901 Long term (current) use of anticoagulants: Secondary | ICD-10-CM | POA: Diagnosis not present

## 2019-01-21 DIAGNOSIS — R600 Localized edema: Secondary | ICD-10-CM

## 2019-01-21 LAB — POCT INR: INR: 2.7 (ref 2.0–3.0)

## 2019-01-21 MED ORDER — ENOXAPARIN SODIUM 150 MG/ML ~~LOC~~ SOLN: 150.0000 mg | Freq: Two times a day (BID) | SUBCUTANEOUS | 1 refills | Status: DC

## 2019-01-21 NOTE — Patient Instructions (Addendum)
Continue taking 1/2 tablet everyday except 1 tablet on Sundays, Tuesdays, and Thursdays. Last dose of warfarin 2/27 before spinal procedure on 3/4. Follow separate Lovenox instructions. Recheck INR 5 days after procedure. Call us with any changes # [phone].   2/27: Last dose of Coumadin.  2/28: No Coumadin or Lovenox.  2/29: Inject Lovenox 150mg  in the fatty abdominal tissue at least 2 inches from the belly button twice a day about 12 hours apart, 8am and 8pm rotate sites. No Coumadin.  3/1: Inject Lovenox in the fatty tissue every 12 hours, 8am and 8pm. No Coumadin.  3/2: Inject Lovenox in the fatty tissue every 12 hours, 8am and 8pm. No Coumadin.  3/3: Inject Lovenox in the fatty tissue in the morning at 8 am (No PM dose). No Coumadin.  3/4: Procedure Day - No Lovenox - Resume Coumadin in the evening or as directed by doctor (take an extra half tablet with usual dose for 2 days then resume normal dose).  3/5: Resume Lovenox inject in the fatty tissue every 12 hours and take Coumadin.  3/6: Inject Lovenox in the fatty tissue every 12 hours and take Coumadin.  3/7: Inject Lovenox in the fatty tissue every 12 hours and take Coumadin.  3/8: Inject Lovenox in the fatty tissue every 12 hours and take Coumadin.  3/9: Coumadin appt to check INR.

## 2019-01-22 ENCOUNTER — Other Ambulatory Visit: Payer: Self-pay | Admitting: Pharmacist

## 2019-01-22 MED ORDER — WARFARIN SODIUM 10 MG PO TABS: ORAL_TABLET | ORAL | 0 refills | Status: DC

## 2019-01-23 ENCOUNTER — Other Ambulatory Visit: Payer: Self-pay

## 2019-01-23 ENCOUNTER — Encounter (HOSPITAL_COMMUNITY): Payer: Self-pay | Admitting: *Deleted

## 2019-01-23 ENCOUNTER — Other Ambulatory Visit: Payer: Self-pay | Admitting: Neurosurgery

## 2019-01-23 MED ORDER — DEXTROSE 5 % IV SOLN
3.0000 g | INTRAVENOUS | Status: AC
  Administered 2019-01-26: 3 g via INTRAVENOUS
  Filled 2019-01-23: qty 3

## 2019-01-23 NOTE — Progress Notes (Signed)
Spoke with pt for pre-op call. Pt has hx of DVT, PE and HTN. Pt sees Dr. Mallie Snooks as cardiologist. Pt denies having a heart attack, heart murmur or irregular heart rate. Pt states he "was" diabetic, states he was told he was no longer diabetic because his A1C's were 5.5 or lower. I told him that in December it was 8.1, he said "yeah, I sort of got off track for a while". He states he no longer has a CBG meter.  Pt is on Coumadin, originally instructed to stop Coumadin 5 days prior to surgery. Surgery has been moved forward and is now Monday. I asked pt when his last dose of Coumadin was and he stated Tuesday, 01/20/19 because he ran out of the medicine. He is going to pick up Lovenox this afternoon and states he is starting it today. States he was told to take Sunday AM dose, but not Sunday PM.

## 2019-01-23 NOTE — Anesthesia Preprocedure Evaluation (Addendum)
Anesthesia Evaluation  Patient identified by MRN, date of birth, ID band Patient awake    Reviewed: Allergy & Precautions, H&P , NPO status , Patient's Chart, lab work & pertinent test results, reviewed documented beta blocker date and time   Airway Mallampati: I  TM Distance: >3 FB Neck ROM: Full    Dental no notable dental hx. (+) Teeth Intact, Dental Advisory Given   Pulmonary former smoker,    Pulmonary exam normal breath sounds clear to auscultation       Cardiovascular hypertension, Pt. on medications and Pt. on home beta blockers + Peripheral Vascular Disease and + DVT   Rhythm:Regular Rate:Normal     Neuro/Psych Depression negative neurological ROS     GI/Hepatic negative GI ROS, Neg liver ROS,   Endo/Other  diabetesMorbid obesity  Renal/GU negative Renal ROS  negative genitourinary   Musculoskeletal  (+) Arthritis , Osteoarthritis,    Abdominal   Peds  Hematology negative hematology ROS (+)   Anesthesia Other Findings   Reproductive/Obstetrics negative OB ROS                           Anesthesia Physical Anesthesia Plan  ASA: III  Anesthesia Plan: General   Post-op Pain Management:    Induction: Intravenous  PONV Risk Score and Plan: 3 and Ondansetron, Dexamethasone and Midazolam  Airway Management Planned: Oral ETT  Additional Equipment: Arterial line  Intra-op Plan:   Post-operative Plan: Extubation in OR  Informed Consent: I have reviewed the patients History and Physical, chart, labs and discussed the procedure including the risks, benefits and alternatives for the proposed anesthesia with the patient or authorized representative who has indicated his/her understanding and acceptance.     Dental advisory given  Plan Discussed with: CRNA  Anesthesia Plan Comments: (PAT note written 01/23/2019 by Myra Gianotti, PA-C. )      Anesthesia Quick  Evaluation

## 2019-01-23 NOTE — Progress Notes (Signed)
Anesthesia Chart Review: SAME DAY WORK-UP (had PAT scheduled for 01/26/2019, but surgery moved from 01/28/2019 to 01/26/2019)   Case:  [id number] Date/Time:  01/26/19 1427   Procedure:  Cervical 4 Corpectomy with Cervical 3-4, Cervical 4-5 interbody fusion, prosthesis, explore old fusion, possible removal of old plate (N/A ) - Cervical 4 Corpectomy with Cervical 3-4, Cervical 4-5 interbody fusion, prosthesis, explore old fusion, possible removal of old plate   Anesthesia type:  General   Pre-op diagnosis:  Cervical spondylosis with myelopathy, Radiculopathy   Location:  [facility] OR ROOM 21 / [facility]   Surgeon:  [name], MD      DISCUSSION: Patient is a 53 year old male scheduled for the above procedure.   History includes former smoker (quit 2002), HTN, DM2 (diet controlled), DVT/PE (PE and RLE DVT 10/16/03; reported PE 2008 and 2010; likely chronic RLE DVT 04/21/09; LLE DVT 06/2012; BLE DVT 01/04/14; IVC filter 11/08/2011 prior to back surgery), back surgeries (reported ~ 7 back surgeries including T2-4 and T9-11 laminectomy 09/14/03; T6-12 posterior decompressive laminectomy 11/2011; T6-8 laminectomies 01/07/14), C6 corpectomy/C5-7 fusion 08/10/04, drug OD (amitriptyline 10/21/08, requiring ventilator support), obesity. He reported that he temporarily required home oxygen with PE ~ 2005, but has not required supplemental O2 in years.   Preoperative cardiology input by Daune Perch, NP on 01/20/2019: ".Marland KitchenMarland KitchenAccording to the RCRI risk calculator, Mr. Shrum is a Class I Risk with 0.4% risk of major cardiac event perioperatively. However, given his size and limited mobility he is likely at a slightly increased risk for complications. This was discussed with the patient and he understands.   Therefore, based on ACC/AHA guidelines, the patient would be at acceptable risk for the planned procedure without further cardiovascular testing." Lovenox bridge recommended which was outlined by [facility] pharmacist. (Since surgery  is now moved up, PAT RN confirmed that Dr. Adline Mango is okay with patient's last dose of warfarin being 01/22/19--although patient reported last dose on 01/20/19 for which I notified Jessica at Dr. Adline Mango office. Patient also confirmed that his last dose of Lovenox is scheduled for 8:00 AM before the day of surgery based on pharmacist initial recommendations.)  He denied chest pain, SOB at rest. Reported chronic BLE edema that has been stable.   He is a same day work-up, so he will get labs and anesthesiologist evaluation on arrival.    PROVIDERS: Shawnee Knapp, MD is PCP. Last visit Delia Chimes, MD on 01/12/19 for probably lipoma right breast with referral to general surgery.  Lauree Chandler, MD is cardiologist. Last visit 05/07/18 with Daune Perch, NP. He is primarily seen there yearly due to him being followed at the [facility].   LABS: He is for labs on arrival for surgery. Comparison labs in Epic include: Lab Results  Component Value Date   WBC 7.1 05/07/2018   HGB 14.1 05/07/2018   HCT 42.9 05/07/2018   PLT 194 05/07/2018   GLUCOSE 155 (H) 10/31/2018   ALT 35 10/31/2018   AST 32 10/31/2018   NA 141 10/31/2018   K 4.3 10/31/2018   CL 101 10/31/2018   CREATININE 0.82 10/31/2018   BUN 15 10/31/2018   CO2 24 10/31/2018   INR 2.7 01/21/2019   HGBA1C 8.1 (H) 10/31/2018    IMAGES: MRI Thoracic/Cervical Spine 01/14/19: IMPRESSION: 1. Severe spinal canal stenosis at C3-4 and C4-5 with spinal cord compression. 2. Multilevel moderate-to-severe cervical neural foraminal stenosis, worst at C3-4. 3. Long segment posterior decompression of the thoracic spine  with resolution of previously seen posterior paraspinal fluid collections. 4. Unchanged myelomalacia within the thoracic spinal cord at the T8-9 level. 5. Multilevel moderate-to-severe thoracic spinal canal stenosis, slightly worsened at the T7-8 and T9-10 levels.   EKG: 05/07/18:  NSR   CV: Echo 05/23/14: Study Conclusions - Left ventricle: The cavity size was normal. Wall thickness was normal. Systolic function was normal. The estimated ejection fraction was in the range of 55% to 60%. Features are consistent with a pseudonormal left ventricular filling pattern, with concomitant abnormal relaxation and increased filling pressure (grade 2 diastolic dysfunction). - Impressions: Technically limited study due to poor sound wave transmission. Impressions: - Technically limited study due to poor sound wave transmission.   Past Medical History:  Diagnosis Date  . Arthritis   . Clotting disorder ([place])    pulmonary embolus, DVT  . Depression   . Diabetes mellitus without complication (HCC)    no meds, watching diet  . DVT (deep venous thrombosis) ([place])    Summer 2012  . Hypertension   . Neuromuscular disorder ([place])    spinal stenosis.- pt walks with a walker short distance  . Peripheral vascular disease ([place])   . Pulmonary embolism ([place])    x 3 in 2005, 2008, 2010  . Sleep apnea    does not wear a c-pap  . Spinal stenosis     Past Surgical History:  Procedure Laterality Date  . CHOLECYSTECTOMY N/A 05/24/2014   Procedure: LAPAROSCOPIC CHOLECYSTECTOMY;  Surgeon: Zenovia Jarred, MD;  Location: [facility];  Service: General;  Laterality: N/A;  . ERCP N/A 05/25/2014   Procedure: ENDOSCOPIC RETROGRADE CHOLANGIOPANCREATOGRAPHY (ERCP);  Surgeon: Beryle Beams, MD;  Location: [facility];  Service: Endoscopy;  Laterality: N/A;  . ERCP N/A 07/16/2014   Procedure: ENDOSCOPIC RETROGRADE CHOLANGIOPANCREATOGRAPHY (ERCP);  Surgeon: Beryle Beams, MD;  Location: [facility];  Service: Endoscopy;  Laterality: N/A;  . ivp filter  jan 2012  . SPINE SURGERY  11/30/11   Total 6 back surgeries  . SPINE SURGERY  [id number]/6384   Dr. Cathren Laine in [place], [place]  . [place]     08/2003, cervial spine 2005, 2010 lower back, 2011 lower back, 2012 & 2013  . UPPER  GASTROINTESTINAL ENDOSCOPY    . WISDOM TOOTH EXTRACTION     2 removed    MEDICATIONS: . 0.9 %  sodium chloride infusion   . furosemide (LASIX) 40 MG tablet  . metoprolol succinate (TOPROL-XL) 50 MG 24 hr tablet  . Multiple Vitamin (MULTIVITAMIN WITH MINERALS) TABS tablet  . potassium chloride SA (K-DUR,KLOR-CON) 20 MEQ tablet  . enoxaparin (LOVENOX) 150 MG/ML injection  . warfarin (COUMADIN) 10 MG tablet    Myra Gianotti, PA-C Surgical Short Stay/Anesthesiology [facility] Phone [phone] [facility] Phone [phone] 01/23/2019 4:15 PM

## 2019-01-26 ENCOUNTER — Inpatient Hospital Stay (HOSPITAL_COMMUNITY): Admission: RE | Admit: 2019-01-26 | Payer: Medicare HMO | Source: Ambulatory Visit

## 2019-01-26 ENCOUNTER — Encounter (HOSPITAL_COMMUNITY): Payer: Self-pay

## 2019-01-26 ENCOUNTER — Inpatient Hospital Stay (HOSPITAL_COMMUNITY): Payer: Medicare HMO | Admitting: Vascular Surgery

## 2019-01-26 ENCOUNTER — Inpatient Hospital Stay (HOSPITAL_COMMUNITY)
Admission: RE | Admit: 2019-01-26 | Discharge: 2019-01-28 | DRG: 471 | Disposition: A | Discharge status: Home Health | Payer: Medicare HMO | Attending: Neurosurgery | Admitting: Neurosurgery

## 2019-01-26 ENCOUNTER — Encounter (HOSPITAL_COMMUNITY)
Admission: RE | Disposition: A | Discharge status: Home Health | Payer: Self-pay | Source: Home / Self Care | Attending: Neurosurgery

## 2019-01-26 ENCOUNTER — Other Ambulatory Visit: Payer: Self-pay

## 2019-01-26 ENCOUNTER — Inpatient Hospital Stay (HOSPITAL_COMMUNITY): Payer: Medicare HMO

## 2019-01-26 DIAGNOSIS — M4722 Other spondylosis with radiculopathy, cervical region: Secondary | ICD-10-CM | POA: Diagnosis not present

## 2019-01-26 DIAGNOSIS — G4730 Sleep apnea, unspecified: Secondary | ICD-10-CM | POA: Diagnosis present

## 2019-01-26 DIAGNOSIS — M4802 Spinal stenosis, cervical region: Secondary | ICD-10-CM | POA: Diagnosis not present

## 2019-01-26 DIAGNOSIS — Z7901 Long term (current) use of anticoagulants: Secondary | ICD-10-CM | POA: Diagnosis not present

## 2019-01-26 DIAGNOSIS — Z888 Allergy status to other drugs, medicaments and biological substances status: Secondary | ICD-10-CM | POA: Diagnosis not present

## 2019-01-26 DIAGNOSIS — Z993 Dependence on wheelchair: Secondary | ICD-10-CM | POA: Diagnosis not present

## 2019-01-26 DIAGNOSIS — Z8042 Family history of malignant neoplasm of prostate: Secondary | ICD-10-CM

## 2019-01-26 DIAGNOSIS — Z8249 Family history of ischemic heart disease and other diseases of the circulatory system: Secondary | ICD-10-CM | POA: Diagnosis not present

## 2019-01-26 DIAGNOSIS — Z23 Encounter for immunization: Secondary | ICD-10-CM

## 2019-01-26 DIAGNOSIS — Z833 Family history of diabetes mellitus: Secondary | ICD-10-CM | POA: Diagnosis not present

## 2019-01-26 DIAGNOSIS — G8250 Quadriplegia, unspecified: Secondary | ICD-10-CM | POA: Diagnosis not present

## 2019-01-26 DIAGNOSIS — I10 Essential (primary) hypertension: Secondary | ICD-10-CM | POA: Diagnosis present

## 2019-01-26 DIAGNOSIS — E1151 Type 2 diabetes mellitus with diabetic peripheral angiopathy without gangrene: Secondary | ICD-10-CM | POA: Diagnosis not present

## 2019-01-26 DIAGNOSIS — Z981 Arthrodesis status: Secondary | ICD-10-CM | POA: Diagnosis not present

## 2019-01-26 DIAGNOSIS — Z87891 Personal history of nicotine dependence: Secondary | ICD-10-CM

## 2019-01-26 DIAGNOSIS — I82501 Chronic embolism and thrombosis of unspecified deep veins of right lower extremity: Secondary | ICD-10-CM | POA: Diagnosis present

## 2019-01-26 DIAGNOSIS — Z86711 Personal history of pulmonary embolism: Secondary | ICD-10-CM | POA: Diagnosis not present

## 2019-01-26 DIAGNOSIS — M4712 Other spondylosis with myelopathy, cervical region: Secondary | ICD-10-CM | POA: Diagnosis not present

## 2019-01-26 DIAGNOSIS — Z419 Encounter for procedure for purposes other than remedying health state, unspecified: Secondary | ICD-10-CM

## 2019-01-26 DIAGNOSIS — Z6841 Body Mass Index (BMI) 40.0 and over, adult: Secondary | ICD-10-CM

## 2019-01-26 DIAGNOSIS — M488X2 Other specified spondylopathies, cervical region: Secondary | ICD-10-CM | POA: Diagnosis not present

## 2019-01-26 HISTORY — PX: ANTERIOR CERVICAL CORPECTOMY: SHX1159

## 2019-01-26 HISTORY — DX: Peripheral vascular disease, unspecified: I73.9

## 2019-01-26 LAB — BASIC METABOLIC PANEL
Anion gap: 8 (ref 5–15)
BUN: 10 mg/dL (ref 6–20)
CO2: 26 mmol/L (ref 22–32)
Calcium: 8.9 mg/dL (ref 8.9–10.3)
Chloride: 105 mmol/L (ref 98–111)
Creatinine, Ser: 0.76 mg/dL (ref 0.61–1.24)
GFR calc Af Amer: >60 mL/min (ref >60)
GFR calc non Af Amer: >60 mL/min (ref >60)
Glucose, Bld: 130 mg/dL — ABNORMAL HIGH (ref 70–99)
Potassium: 3.8 mmol/L (ref 3.5–5.1)
Sodium: 139 mmol/L (ref 135–145)

## 2019-01-26 LAB — TYPE AND SCREEN
ABO/RH(D): A POS
Antibody Screen: NEGATIVE

## 2019-01-26 LAB — GLUCOSE, CAPILLARY
Glucose-Capillary: 125 mg/dL — ABNORMAL HIGH (ref 70–99)
Glucose-Capillary: 205 mg/dL — ABNORMAL HIGH (ref 70–99)

## 2019-01-26 LAB — CBC
HCT: 46.6 % (ref 39.0–52.0)
Hemoglobin: 14.7 g/dL (ref 13.0–17.0)
MCH: 29.6 pg (ref 26.0–34.0)
MCHC: 31.5 g/dL (ref 30.0–36.0)
MCV: 94 fL (ref 80.0–100.0)
Platelets: 192 10*3/uL (ref 150–400)
RBC: 4.96 MIL/uL (ref 4.22–5.81)
RDW: 14.3 % (ref 11.5–15.5)
WBC: 6.9 10*3/uL (ref 4.0–10.5)
nRBC: 0 % (ref 0.0–0.2)

## 2019-01-26 LAB — PROTIME-INR
INR: 1 (ref 0.8–1.2)
Prothrombin Time: 12.9 seconds (ref 11.4–15.2)

## 2019-01-26 SURGERY — ANTERIOR CERVICAL CORPECTOMY
Anesthesia: General | Site: Spine Cervical

## 2019-01-26 MED ORDER — DEXAMETHASONE 4 MG PO TABS
4.0000 mg | ORAL_TABLET | Freq: Four times a day (QID) | ORAL | Status: AC
  Administered 2019-01-26 – 2019-01-27 (×2): 4 mg via ORAL
  Filled 2019-01-26 (×2): qty 1

## 2019-01-26 MED ORDER — PROMETHAZINE HCL 25 MG/ML IJ SOLN
INTRAMUSCULAR | Status: AC
  Administered 2019-01-26: 6.25 mg via INTRAVENOUS
  Filled 2019-01-26: qty 1

## 2019-01-26 MED ORDER — BACITRACIN ZINC 500 UNIT/GM EX OINT
TOPICAL_OINTMENT | CUTANEOUS | Status: DC | PRN
  Administered 2019-01-26: 1 via TOPICAL

## 2019-01-26 MED ORDER — FUROSEMIDE 40 MG PO TABS
40.0000 mg | ORAL_TABLET | Freq: Every day | ORAL | Status: DC
  Administered 2019-01-27 – 2019-01-28 (×2): 40 mg via ORAL
  Filled 2019-01-26 (×2): qty 1

## 2019-01-26 MED ORDER — LACTATED RINGERS IV SOLN
INTRAVENOUS | Status: DC
  Administered 2019-01-26 – 2019-01-27 (×2): via INTRAVENOUS

## 2019-01-26 MED ORDER — OXYCODONE HCL 5 MG PO TABS
10.0000 mg | ORAL_TABLET | ORAL | Status: DC | PRN
  Administered 2019-01-27 – 2019-01-28 (×5): 10 mg via ORAL
  Filled 2019-01-26 (×5): qty 2

## 2019-01-26 MED ORDER — FENTANYL CITRATE (PF) 250 MCG/5ML IJ SOLN
INTRAMUSCULAR | Status: DC | PRN
  Administered 2019-01-26: 100 ug via INTRAVENOUS
  Administered 2019-01-26: 150 ug via INTRAVENOUS
  Administered 2019-01-26 (×5): 50 ug via INTRAVENOUS

## 2019-01-26 MED ORDER — PROPOFOL 10 MG/ML IV BOLUS
INTRAVENOUS | Status: DC | PRN
  Administered 2019-01-26: 40 mg via INTRAVENOUS
  Administered 2019-01-26: 150 mg via INTRAVENOUS
  Administered 2019-01-26: 50 mg via INTRAVENOUS

## 2019-01-26 MED ORDER — PHENYLEPHRINE 40 MCG/ML (10ML) SYRINGE FOR IV PUSH (FOR BLOOD PRESSURE SUPPORT)
PREFILLED_SYRINGE | INTRAVENOUS | Status: DC | PRN
  Administered 2019-01-26: 80 ug via INTRAVENOUS
  Administered 2019-01-26 (×2): 40 ug via INTRAVENOUS

## 2019-01-26 MED ORDER — MENTHOL 3 MG MT LOZG: 1.0000 | LOZENGE | OROMUCOSAL | Status: DC | PRN

## 2019-01-26 MED ORDER — PROPOFOL 10 MG/ML IV BOLUS
INTRAVENOUS | Status: AC
  Filled 2019-01-26: qty 20

## 2019-01-26 MED ORDER — ACETAMINOPHEN 325 MG PO TABS
650.0000 mg | ORAL_TABLET | ORAL | Status: DC | PRN
  Administered 2019-01-28: 650 mg via ORAL
  Filled 2019-01-26: qty 2

## 2019-01-26 MED ORDER — ONDANSETRON HCL 4 MG PO TABS: 4.0000 mg | ORAL_TABLET | Freq: Four times a day (QID) | ORAL | Status: DC | PRN

## 2019-01-26 MED ORDER — SODIUM CHLORIDE 0.9 % IV SOLN
INTRAVENOUS | Status: DC | PRN
  Administered 2019-01-26: 15:00:00

## 2019-01-26 MED ORDER — HYDROMORPHONE HCL 1 MG/ML IJ SOLN
INTRAMUSCULAR | Status: AC
  Administered 2019-01-26: 0.5 mg via INTRAVENOUS
  Filled 2019-01-26: qty 1

## 2019-01-26 MED ORDER — SODIUM CHLORIDE 0.9 % IV SOLN
INTRAVENOUS | Status: DC | PRN
  Administered 2019-01-26: 10 ug/min via INTRAVENOUS

## 2019-01-26 MED ORDER — BISACODYL 10 MG RE SUPP: 10.0000 mg | Freq: Every day | RECTAL | Status: DC | PRN

## 2019-01-26 MED ORDER — THROMBIN 5000 UNITS EX SOLR
CUTANEOUS | Status: AC
  Filled 2019-01-26: qty 5000

## 2019-01-26 MED ORDER — LIDOCAINE 2% (20 MG/ML) 5 ML SYRINGE
INTRAMUSCULAR | Status: AC
  Filled 2019-01-26: qty 5

## 2019-01-26 MED ORDER — ONDANSETRON HCL 4 MG/2ML IJ SOLN
INTRAMUSCULAR | Status: AC
  Filled 2019-01-26: qty 2

## 2019-01-26 MED ORDER — DEXAMETHASONE SODIUM PHOSPHATE 10 MG/ML IJ SOLN
INTRAMUSCULAR | Status: DC | PRN
  Administered 2019-01-26: 10 mg via INTRAVENOUS

## 2019-01-26 MED ORDER — 0.9 % SODIUM CHLORIDE (POUR BTL) OPTIME
TOPICAL | Status: DC | PRN
  Administered 2019-01-26: 1000 mL

## 2019-01-26 MED ORDER — PHENYLEPHRINE 40 MCG/ML (10ML) SYRINGE FOR IV PUSH (FOR BLOOD PRESSURE SUPPORT)
PREFILLED_SYRINGE | INTRAVENOUS | Status: AC
  Filled 2019-01-26: qty 10

## 2019-01-26 MED ORDER — HYDROMORPHONE HCL 1 MG/ML IJ SOLN
0.2500 mg | INTRAMUSCULAR | Status: DC | PRN
  Administered 2019-01-26: 0.5 mg via INTRAVENOUS

## 2019-01-26 MED ORDER — ALBUMIN HUMAN 5 % IV SOLN
INTRAVENOUS | Status: DC | PRN
  Administered 2019-01-26: 15:00:00 via INTRAVENOUS

## 2019-01-26 MED ORDER — LIDOCAINE 2% (20 MG/ML) 5 ML SYRINGE
INTRAMUSCULAR | Status: DC | PRN
  Administered 2019-01-26: 60 mg via INTRAVENOUS

## 2019-01-26 MED ORDER — CYCLOBENZAPRINE HCL 10 MG PO TABS
10.0000 mg | ORAL_TABLET | Freq: Three times a day (TID) | ORAL | Status: DC | PRN
  Administered 2019-01-27 – 2019-01-28 (×2): 10 mg via ORAL
  Filled 2019-01-26 (×2): qty 1

## 2019-01-26 MED ORDER — THROMBIN 5000 UNITS EX SOLR
OROMUCOSAL | Status: DC | PRN
  Administered 2019-01-26: 15:00:00 via TOPICAL
  Administered 2019-01-26: 5 mL via TOPICAL

## 2019-01-26 MED ORDER — ONDANSETRON HCL 4 MG/2ML IJ SOLN
INTRAMUSCULAR | Status: DC | PRN
  Administered 2019-01-26: 4 mg via INTRAVENOUS

## 2019-01-26 MED ORDER — OXYCODONE HCL 5 MG PO TABS
5.0000 mg | ORAL_TABLET | ORAL | Status: DC | PRN
  Administered 2019-01-27 (×2): 5 mg via ORAL
  Filled 2019-01-26 (×2): qty 1

## 2019-01-26 MED ORDER — PANTOPRAZOLE SODIUM 40 MG IV SOLR
40.0000 mg | Freq: Every day | INTRAVENOUS | Status: DC
  Administered 2019-01-26 – 2019-01-27 (×2): 40 mg via INTRAVENOUS
  Filled 2019-01-26 (×2): qty 40

## 2019-01-26 MED ORDER — ONDANSETRON HCL 4 MG/2ML IJ SOLN
4.0000 mg | Freq: Four times a day (QID) | INTRAMUSCULAR | Status: DC | PRN
  Administered 2019-01-26: 4 mg via INTRAVENOUS
  Filled 2019-01-26: qty 2

## 2019-01-26 MED ORDER — ROCURONIUM BROMIDE 50 MG/5ML IV SOSY
PREFILLED_SYRINGE | INTRAVENOUS | Status: AC
  Filled 2019-01-26: qty 5

## 2019-01-26 MED ORDER — DEXAMETHASONE SODIUM PHOSPHATE 4 MG/ML IJ SOLN: 4.0000 mg | Freq: Four times a day (QID) | INTRAMUSCULAR | Status: AC

## 2019-01-26 MED ORDER — MIDAZOLAM HCL 2 MG/2ML IJ SOLN
INTRAMUSCULAR | Status: AC
  Filled 2019-01-26: qty 2

## 2019-01-26 MED ORDER — POTASSIUM CHLORIDE CRYS ER 20 MEQ PO TBCR
20.0000 meq | EXTENDED_RELEASE_TABLET | Freq: Every day | ORAL | Status: DC
  Administered 2019-01-27 – 2019-01-28 (×2): 20 meq via ORAL
  Filled 2019-01-26 (×2): qty 1

## 2019-01-26 MED ORDER — LACTATED RINGERS IV SOLN
INTRAVENOUS | Status: DC
  Administered 2019-01-26 (×2): via INTRAVENOUS

## 2019-01-26 MED ORDER — ACETAMINOPHEN 650 MG RE SUPP: 650.0000 mg | RECTAL | Status: DC | PRN

## 2019-01-26 MED ORDER — PHENOL 1.4 % MT LIQD: 1.0000 | OROMUCOSAL | Status: DC | PRN

## 2019-01-26 MED ORDER — DOCUSATE SODIUM 100 MG PO CAPS
100.0000 mg | ORAL_CAPSULE | Freq: Two times a day (BID) | ORAL | Status: DC
  Administered 2019-01-26 – 2019-01-28 (×4): 100 mg via ORAL
  Filled 2019-01-26 (×4): qty 1

## 2019-01-26 MED ORDER — MORPHINE SULFATE (PF) 4 MG/ML IV SOLN: 4.0000 mg | INTRAVENOUS | Status: DC | PRN

## 2019-01-26 MED ORDER — CHLORHEXIDINE GLUCONATE CLOTH 2 % EX PADS: 6.0000 | MEDICATED_PAD | Freq: Once | CUTANEOUS | Status: DC

## 2019-01-26 MED ORDER — HYDROMORPHONE HCL 1 MG/ML IJ SOLN
INTRAMUSCULAR | Status: DC | PRN
  Administered 2019-01-26: 0.5 mg via INTRAVENOUS

## 2019-01-26 MED ORDER — KETAMINE HCL 10 MG/ML IJ SOLN
INTRAMUSCULAR | Status: DC | PRN
  Administered 2019-01-26: 20 mg via INTRAVENOUS
  Administered 2019-01-26: 30 mg via INTRAVENOUS

## 2019-01-26 MED ORDER — DEXAMETHASONE SODIUM PHOSPHATE 10 MG/ML IJ SOLN
INTRAMUSCULAR | Status: AC
  Filled 2019-01-26: qty 1

## 2019-01-26 MED ORDER — KETAMINE HCL 50 MG/5ML IJ SOSY
PREFILLED_SYRINGE | INTRAMUSCULAR | Status: AC
  Filled 2019-01-26: qty 5

## 2019-01-26 MED ORDER — MIDAZOLAM HCL 5 MG/5ML IJ SOLN
INTRAMUSCULAR | Status: DC | PRN
  Administered 2019-01-26: 2 mg via INTRAVENOUS

## 2019-01-26 MED ORDER — METOPROLOL SUCCINATE ER 50 MG PO TB24
50.0000 mg | ORAL_TABLET | Freq: Every day | ORAL | Status: DC
  Administered 2019-01-27 – 2019-01-28 (×2): 50 mg via ORAL
  Filled 2019-01-26 (×2): qty 1

## 2019-01-26 MED ORDER — CEFAZOLIN SODIUM-DEXTROSE 2-4 GM/100ML-% IV SOLN
2.0000 g | Freq: Three times a day (TID) | INTRAVENOUS | Status: AC
  Administered 2019-01-26 – 2019-01-27 (×2): 2 g via INTRAVENOUS
  Filled 2019-01-26 (×3): qty 100

## 2019-01-26 MED ORDER — FENTANYL CITRATE (PF) 250 MCG/5ML IJ SOLN
INTRAMUSCULAR | Status: AC
  Filled 2019-01-26: qty 5

## 2019-01-26 MED ORDER — ACETAMINOPHEN 500 MG PO TABS
1000.0000 mg | ORAL_TABLET | Freq: Once | ORAL | Status: AC
  Administered 2019-01-26: 1000 mg via ORAL
  Filled 2019-01-26: qty 2

## 2019-01-26 MED ORDER — ALUM & MAG HYDROXIDE-SIMETH 200-200-20 MG/5ML PO SUSP: 30.0000 mL | Freq: Four times a day (QID) | ORAL | Status: DC | PRN

## 2019-01-26 MED ORDER — PROMETHAZINE HCL 25 MG/ML IJ SOLN
6.2500 mg | INTRAMUSCULAR | Status: DC | PRN
  Administered 2019-01-26: 6.25 mg via INTRAVENOUS

## 2019-01-26 MED ORDER — ROCURONIUM BROMIDE 10 MG/ML (PF) SYRINGE
PREFILLED_SYRINGE | INTRAVENOUS | Status: DC | PRN
  Administered 2019-01-26: 20 mg via INTRAVENOUS
  Administered 2019-01-26: 10 mg via INTRAVENOUS
  Administered 2019-01-26: 20 mg via INTRAVENOUS
  Administered 2019-01-26: 100 mg via INTRAVENOUS

## 2019-01-26 MED ORDER — ACETAMINOPHEN 500 MG PO TABS
1000.0000 mg | ORAL_TABLET | Freq: Four times a day (QID) | ORAL | Status: AC
  Administered 2019-01-26 – 2019-01-27 (×4): 1000 mg via ORAL
  Filled 2019-01-26 (×4): qty 2

## 2019-01-26 MED ORDER — ROCURONIUM BROMIDE 50 MG/5ML IV SOSY
PREFILLED_SYRINGE | INTRAVENOUS | Status: AC
  Filled 2019-01-26: qty 10

## 2019-01-26 MED ORDER — HYDROMORPHONE HCL 1 MG/ML IJ SOLN
INTRAMUSCULAR | Status: AC
  Filled 2019-01-26: qty 0.5

## 2019-01-26 MED ORDER — LIDOCAINE-EPINEPHRINE 1 %-1:100000 IJ SOLN
INTRAMUSCULAR | Status: DC | PRN
  Administered 2019-01-26: 10 mL

## 2019-01-26 SURGICAL SUPPLY — 65 items
APL SKNCLS STERI-STRIP NONHPOA (GAUZE/BANDAGES/DRESSINGS) ×1
ATTRACTOMAT 16X20 MAGNETIC DRP (DRAPES) ×2 IMPLANT
BAG DECANTER FOR FLEXI CONT (MISCELLANEOUS) ×2 IMPLANT
BASKET BONE COLLECTION (BASKET) ×2 IMPLANT
BENZOIN TINCTURE PRP APPL 2/3 (GAUZE/BANDAGES/DRESSINGS) ×3 IMPLANT
BIT DRILL NEURO 2X3.1 SFT TUCH (MISCELLANEOUS) ×1 IMPLANT
BLADE SURG 15 STRL LF DISP TIS (BLADE) ×1 IMPLANT
BLADE SURG 15 STRL SS (BLADE) ×2
BLADE ULTRA TIP 2M (BLADE) ×2 IMPLANT
BUR BARREL STRAIGHT FLUTE 4.0 (BURR) ×2 IMPLANT
BUR MATCHSTICK NEURO 3.0 LAGG (BURR) ×6 IMPLANT
CAGE VBR 14X24-40 6D (Cage) ×2 IMPLANT
CANISTER SUCT 3000ML PPV (MISCELLANEOUS) ×2 IMPLANT
CARTRIDGE OIL MAESTRO DRILL (MISCELLANEOUS) ×1 IMPLANT
CASSETTE HEAD DISP 12 (ORTHOPEDIC DISPOSABLE SUPPLIES) ×2 IMPLANT
COVER MAYO STAND STRL (DRAPES) ×2 IMPLANT
COVER WAND RF STERILE (DRAPES) ×1 IMPLANT
DECANTER SPIKE VIAL GLASS SM (MISCELLANEOUS) ×2 IMPLANT
DIFFUSER DRILL AIR PNEUMATIC (MISCELLANEOUS) ×2 IMPLANT
DRAPE HALF SHEET 40X57 (DRAPES) ×1 IMPLANT
DRAPE LAPAROTOMY 100X72 PEDS (DRAPES) ×2 IMPLANT
DRAPE MICROSCOPE LEICA (MISCELLANEOUS) ×2 IMPLANT
DRAPE POUCH INSTRU U-SHP 10X18 (DRAPES) ×2 IMPLANT
DRAPE SURG 17X23 STRL (DRAPES) ×4 IMPLANT
DRILL NEURO 2X3.1 SOFT TOUCH (MISCELLANEOUS) ×2
DRSG OPSITE POSTOP 4X6 (GAUZE/BANDAGES/DRESSINGS) ×2 IMPLANT
ELECT REM PT RETURN 9FT ADLT (ELECTROSURGICAL) ×2
ELECTRODE REM PT RTRN 9FT ADLT (ELECTROSURGICAL) ×1 IMPLANT
GAUZE 4X4 16PLY RFD (DISPOSABLE) IMPLANT
GAUZE SPONGE 4X4 12PLY STRL (GAUZE/BANDAGES/DRESSINGS) IMPLANT
GLOVE BIO SURGEON STRL SZ 6.5 (GLOVE) ×12 IMPLANT
GLOVE BIO SURGEON STRL SZ8 (GLOVE) ×2 IMPLANT
GLOVE BIO SURGEON STRL SZ8.5 (GLOVE) ×2 IMPLANT
GLOVE BIOGEL PI IND STRL 6.5 (GLOVE) ×4 IMPLANT
GLOVE BIOGEL PI INDICATOR 6.5 (GLOVE) ×4
GLOVE ECLIPSE 7.0 STRL STRAW (GLOVE) ×1 IMPLANT
GLOVE EXAM NITRILE XL STR (GLOVE) IMPLANT
GOWN STRL REUS W/ TWL LRG LVL3 (GOWN DISPOSABLE) ×4 IMPLANT
GOWN STRL REUS W/ TWL XL LVL3 (GOWN DISPOSABLE) ×1 IMPLANT
GOWN STRL REUS W/TWL LRG LVL3 (GOWN DISPOSABLE) ×8
GOWN STRL REUS W/TWL XL LVL3 (GOWN DISPOSABLE) ×2
HEMOSTAT POWDER KIT SURGIFOAM (HEMOSTASIS) ×4 IMPLANT
KIT BASIN OR (CUSTOM PROCEDURE TRAY) ×2 IMPLANT
KIT TURNOVER KIT B (KITS) ×2 IMPLANT
MARKER SKIN DUAL TIP RULER LAB (MISCELLANEOUS) ×2 IMPLANT
NEEDLE HYPO 22GX1.5 SAFETY (NEEDLE) ×2 IMPLANT
NEEDLE SPNL 18GX3.5 QUINCKE PK (NEEDLE) ×2 IMPLANT
NS IRRIG 1000ML POUR BTL (IV SOLUTION) ×2 IMPLANT
OIL CARTRIDGE MAESTRO DRILL (MISCELLANEOUS) ×2
PACK LAMINECTOMY NEURO (CUSTOM PROCEDURE TRAY) ×2 IMPLANT
PIN DISTRACTION 14MM (PIN) ×4 IMPLANT
PLATE SKYLINE 3LVL 45MM CERV (Plate) ×2 IMPLANT
RUBBERBAND STERILE (MISCELLANEOUS) ×2 IMPLANT
SCREW SET VBR SMALL (Screw) ×2 IMPLANT
SCREW VAR SELF TAP SKYLINE 14M (Screw) ×4 IMPLANT
SPONGE INTESTINAL PEANUT (DISPOSABLE) ×6 IMPLANT
SPONGE SURGIFOAM ABS GEL 100 (HEMOSTASIS) IMPLANT
STRIP CLOSURE SKIN 1/2X4 (GAUZE/BANDAGES/DRESSINGS) ×2 IMPLANT
SUT VIC AB 0 CT1 27 (SUTURE) ×2
SUT VIC AB 0 CT1 27XBRD ANTBC (SUTURE) ×1 IMPLANT
SUT VIC AB 3-0 SH 8-18 (SUTURE) ×2 IMPLANT
TOWEL GREEN STERILE (TOWEL DISPOSABLE) ×2 IMPLANT
TOWEL GREEN STERILE FF (TOWEL DISPOSABLE) ×2 IMPLANT
TRAY CATH 16FR W/PLASTIC CATH (SET/KITS/TRAYS/PACK) ×1 IMPLANT
WATER STERILE IRR 1000ML POUR (IV SOLUTION) ×2 IMPLANT

## 2019-01-26 NOTE — Anesthesia Procedure Notes (Signed)
Procedure Name: Intubation Date/Time: 01/26/2019 2:20 PM Performed by: Colin Benton, CRNA Pre-anesthesia Checklist: Patient identified, Emergency Drugs available, Suction available and Patient being monitored Patient Re-evaluated:Patient Re-evaluated prior to induction Oxygen Delivery Method: Circle system utilized Preoxygenation: Pre-oxygenation with 100% oxygen Induction Type: IV induction Ventilation: Mask ventilation without difficulty Laryngoscope Size: Mac and 4 Grade View: Grade II Tube type: Oral Tube size: 7.5 mm Number of attempts: 1 Airway Equipment and Method: Stylet Placement Confirmation: ETT inserted through vocal cords under direct vision and breath sounds checked- equal and bilateral Secured at: 24 cm Tube secured with: Tape Dental Injury: Teeth and Oropharynx as per pre-operative assessment  Comments: Neck kept in neutral position during DL.

## 2019-01-26 NOTE — H&P (Signed)
Subjective: The patient is a 53 year old black male whose had a previous thoracic laminectomy by Dr. Luiz Ochoa in October 2004 for thoracic myelopathy.  He is also had a C6 corpectomy by Dr. Luiz Ochoa in September 2005.   He has been chronically wheelchair-bound.  He has presented with progressive Quadraparesis, numbness, back pain, etc.  He has been worked up with a cervical, thoracic and lumbar MRI which demonstrated severe stenosis at all 3 regions.  I discussed the situation with the patient and recommended we address his cervical stenosis and myelopathy first.  He has weighed the risks, benefits and alternatives surgery and decided proceed with a C4 corpectomy with anterior instrumentation and fusion.  He is on Coumadin for DVTs.  He stopped this 3 days ago.  A PT/INR is pending.  Past Medical History:  Diagnosis Date  . Arthritis   . Clotting disorder ([place])    pulmonary embolus, DVT  . Depression   . Diabetes mellitus without complication (HCC)    no meds, watching diet  . DVT (deep venous thrombosis) ([place])    Summer 2012  . Hypertension   . Neuromuscular disorder ([place])    spinal stenosis.- pt walks with a walker short distance  . Peripheral vascular disease ([place])   . Pulmonary embolism ([place])    x 3 in 2005, 2008, 2010  . Sleep apnea    does not wear a c-pap  . Spinal stenosis     Past Surgical History:  Procedure Laterality Date  . CHOLECYSTECTOMY N/A 05/24/2014   Procedure: LAPAROSCOPIC CHOLECYSTECTOMY;  Surgeon: Zenovia Jarred, MD;  Location: [facility];  Service: General;  Laterality: N/A;  . ERCP N/A 05/25/2014   Procedure: ENDOSCOPIC RETROGRADE CHOLANGIOPANCREATOGRAPHY (ERCP);  Surgeon: Beryle Beams, MD;  Location: [facility];  Service: Endoscopy;  Laterality: N/A;  . ERCP N/A 07/16/2014   Procedure: ENDOSCOPIC RETROGRADE CHOLANGIOPANCREATOGRAPHY (ERCP);  Surgeon: Beryle Beams, MD;  Location: [facility];  Service: Endoscopy;  Laterality: N/A;  . ivp filter  jan 2012  . SPINE  SURGERY  11/30/11   Total 6 back surgeries  . SPINE SURGERY  01/2121   Dr. Cathren Laine in [place], [place]  . [place]     08/2003, cervial spine 2005, 2010 lower back, 2011 lower back, 2012 & 2013  . UPPER GASTROINTESTINAL ENDOSCOPY    . WISDOM TOOTH EXTRACTION     2 removed    Allergies  Allergen Reactions  . Lisinopril Other (See Comments)    cough  . Ace Inhibitors Other (See Comments)    cough    Social History   Tobacco Use  . Smoking status: Former Smoker    Packs/day: 1.50    Years: 10.00    Pack years: 15.00    Types: Cigarettes    Last attempt to quit: 04/26/2001    Years since quitting: 17.7  . Smokeless tobacco: Never Used  Substance Use Topics  . Alcohol use: Yes    Alcohol/week: 1.0 standard drinks    Types: 1 Standard drinks or equivalent per week    Comment: very little    Family History  Problem Relation Age of Onset  . Cancer Mother 31       Lung  . Diabetes Father   . Prostate cancer Father   . Hypertension Father   . Hypertension Brother   . Hypertension Brother   . Diabetes Brother   . Heart attack Neg Hx   . Stroke Neg Hx   . Colon cancer Neg  Hx   . Esophageal cancer Neg Hx   . Pancreatic cancer Neg Hx   . Rectal cancer Neg Hx   . Stomach cancer Neg Hx    Prior to Admission medications   Medication Sig Start Date End Date Taking? Authorizing Provider  furosemide (LASIX) 40 MG tablet Take 1 tablet (40 mg total) by mouth daily. 05/30/18  Yes Burnell Blanks, MD  metoprolol succinate (TOPROL-XL) 50 MG 24 hr tablet TAKE 1 TABLET EVERY EVENING WITH OR IMMEDIATELY FOLLOWING A MEAL Patient taking differently: Take 50 mg by mouth daily.  11/21/18  Yes Shawnee Knapp, MD  Multiple Vitamin (MULTIVITAMIN WITH MINERALS) TABS tablet Take 1 tablet by mouth daily. Reported on 11/09/2015   Yes [provider]  potassium chloride SA (K-DUR,KLOR-CON) 20 MEQ tablet Take 1 tablet (20 mEq total) by mouth as needed (with lasix). Patient taking  differently: Take 20 mEq by mouth daily.  05/30/18  Yes Burnell Blanks, MD  enoxaparin (LOVENOX) 150 MG/ML injection Inject 1 mL (150 mg total) into the skin every 12 (twelve) hours. 01/21/19   Burnell Blanks, MD  warfarin (COUMADIN) 10 MG tablet Take 1/2 to 1 tablet daily as directed by coumadin clinic 01/22/19   Burnell Blanks, MD     Review of Systems  Positive ROS: As above  All other systems have been reviewed and were otherwise negative with the exception of those mentioned in the HPI and as above.  Objective: Vital signs in last 24 hours:   Estimated body mass index is 44.48 kg/m as calculated from the following:   Height as of 01/12/19: 5\' 10"  (1.778 m).   Weight as of 01/12/19: 140.6 kg.   General Appearance: Alert Head: Normocephalic, without obvious abnormality, atraumatic Eyes: PERRL, conjunctiva/corneas clear, EOM's intact,    Ears: Normal  Throat: Normal  Neck: The patient has a limited cervical range of motion.  His left anterior cervical incision is well-healed.  Back: unremarkable Lungs: Clear to auscultation bilaterally, respirations unlabored Heart: Regular rate and rhythm, no murmur, rub or gallop Abdomen: Obese extremities: Edematous Skin: unremarkable  NEUROLOGIC:   Mental status: alert and oriented,Motor Exam -the patient is quadriparetic Sensory Exam -the patient has numbness in his hands reflexes:  Coordination - grossly normal Gait -unsteady Balance -unsteady Cranial Nerves: I: smell Not tested  II: visual acuity  OS: Normal  OD: Normal   II: visual fields Full to confrontation  II: pupils Equal, round, reactive to light  III,VII: ptosis None  III,IV,VI: extraocular muscles  Full ROM  V: mastication Normal  V: facial light touch sensation  Normal  V,VII: corneal reflex  Present  VII: facial muscle function - upper  Normal  VII: facial muscle function - lower Normal  VIII: hearing Not tested  IX: soft palate elevation   Normal  IX,X: gag reflex Present  XI: trapezius strength  5/5  XI: sternocleidomastoid strength 5/5  XI: neck flexion strength  5/5  XII: tongue strength  Normal    Data Review Lab Results  Component Value Date   WBC 7.1 05/07/2018   HGB 14.1 05/07/2018   HCT 42.9 05/07/2018   MCV 92 05/07/2018   PLT 194 05/07/2018   Lab Results  Component Value Date   NA 141 10/31/2018   K 4.3 10/31/2018   CL 101 10/31/2018   CO2 24 10/31/2018   BUN 15 10/31/2018   CREATININE 0.82 10/31/2018   GLUCOSE 155 (H) 10/31/2018   Lab Results  Component Value Date   INR 2.7 01/21/2019    Assessment/Plan: Cervical, thoracic and lumbar spinal stenosis; cervical and thoracic myelopathy, lumbar radiculopathy, lumbago: I have discussed the situation with the patient.  I have reviewed his cervical, thoracic and lumbar MRI with him.  He has narrowing in all 3 regions.  I recommend we address his cervical stenosis first.  I have described the surgical treatment option of a C4 corpectomy with interbody fusion at C3-4 and C4-5 with anterior cervical plate and interbody prosthesis.  I have described that surgery to him.  I have shown him surgical models.  I have given him a surgical pamphlet.  We have discussed the risks, benefits, alternatives, expected postoperative course, and likelihood of achieving our goals with surgery.  He understands that he will likely also need thoracic and lumbar surgery as well in the future.  I have answered all his questions.  He has decided to proceed with the cervical surgery.   Ophelia Charter 01/26/2019 12:59 PM

## 2019-01-26 NOTE — Progress Notes (Signed)
Subjective: The patient is somnolent but easily arousable.  He is in no apparent distress.  He looks well.  Objective: Vital signs in last 24 hours: Temp:  [98.5 F (36.9 C)] 98.5 F (36.9 C) (03/02 1317) Pulse Rate:  [68] 68 (03/02 1317) Resp:  [18] 18 (03/02 1317) BP: (142)/(59) 142/59 (03/02 1317) SpO2:  [98 %] 98 % (03/02 1317) Weight:  [142.9 kg] 142.9 kg (03/02 1319) Estimated body mass index is 45.2 kg/m as calculated from the following:   Height as of this encounter: 5\' 10"  (1.778 m).   Weight as of this encounter: 142.9 kg.   Intake/Output from previous day: No intake/output data recorded. Intake/Output this shift: Total I/O In: 250 [IV Piggyback:250] Out: 200 [Blood:200]  Physical exam the patient is somnolent but arousable.  He is moving all 4 extremities well.  His dressing is clean and dry.  There is no hematoma or shift.  Lab Results: Recent Labs    01/26/19 1302  WBC 6.9  HGB 14.7  HCT 46.6  PLT 192   BMET Recent Labs    01/26/19 1302  NA 139  K 3.8  CL 105  CO2 26  GLUCOSE 130*  BUN 10  CREATININE 0.76  CALCIUM 8.9    Studies/Results: No results found.  Assessment/Plan: The patient is doing well.  I spoke with his daughter.  LOS: 0 days     Ophelia Charter 01/26/2019, 6:10 PM

## 2019-01-26 NOTE — Op Note (Signed)
Brief history: The patient is a 53 year old black male who is had a previous C6 corpectomy by Dr. Luiz Ochoa in 2005.  He presented with progressive Quadraparesis.  He was worked up with an MRI of his cervical, thoracic and lumbar spine.  He had severe stenosis at each region.  I recommend he proceed with a C4 corpectomy with instrumentation and fusion from C3-C5.  The patient has weighed the risks, benefits and alternatives to surgery and decided proceed with the operation.  Preoperative diagnosis: C3-4 and C4-5 spondylosis, stenosis, ossification of the posterior longitudinal ligament, cervical myelopathy, Quadraparesis  Postoperative diagnosis: The same  Procedure: C4 corpectomy using microdissection; C3-4 and C4-5 interbody arthrodesis with local morcellized autograft bone; insertion of interbody prosthesis at the C corpectomy site, Depue VBR MI: Anterior cervical plating from C3-C5 with Depue skyline titanium plate; exploration of cervical fusion/removal of old cervical plate  Surgeon: Dr. Earle Gell  Asst.: Arnetha Massy nurse practitioner  Anesthesia: Gen. endotracheal  Estimated blood loss: 150 cc  Drains: None  Complications: None  Description of procedure: The patient was brought to the operating room by the anesthesia team. General endotracheal anesthesia was induced. A roll was placed under the patient's shoulders to keep the neck in the neutral position. The patient's anterior cervical region was then prepared with Betadine scrub and Betadine solution. Sterile drapes were applied.  The area to be incised was then injected with Marcaine with epinephrine solution. I then used a scalpel to make a transverse incision in the patient's left anterior neck. I used the Metzenbaum scissors to dissected through the scar tissue and to divide the platysmal muscle and then to dissect medial to the sternocleidomastoid muscle, jugular vein, and carotid artery. I carefully dissected down towards the  anterior cervical spine identifying the esophagus and retracting it medially. Then using Kitner swabs to clear soft tissue from the anterior cervical spine and to expose the old plate from I9-C7. We then inserted a bent spinal needle into the upper exposed intervertebral disc space. We then obtained intraoperative radiographs confirm our location.  We explored the fusion by removing the the screws from the old plate then removing the plate.  The arthrodesis at C5-6 and C6-7 appeared solid.  I then used electrocautery to detach the medial border of the longus colli muscle bilaterally from the C3-4 and C4-5 intervertebral disc spaces. I then inserted the Caspar self-retaining retractor underneath the longus colli muscle bilaterally to provide exposure.  We then incised the intervertebral disc at C3-4 and C4-5. We then performed a partial intervertebral discectomy with a pituitary forceps and the Karlin curettes. I then inserted distraction screws into the vertebral bodies at C3 and C5. We then distracted the interspace.  I used a Land to perform a partial corpectomy at C4, saving the bone for the fusion.  We then used the high-speed drill to away the remainder of the C4 vertebrae, to drill away the remainder of the intervertebral disc at C3-4 and C4-5, to drill away some posterior spondylosis, and to thin out the posterior longitudinal ligament. I then incised ligament with the arachnoid knife. We then removed the ligament with a Kerrison punches undercutting the vertebral endplates and decompressing the thecal sac from C3-4 to C4-5. We then performed foraminotomies about the bilateral C4 and C5 nerve roots. This completed the decompression at this level.  We now turned our to attention to the interbody fusion. We used the calipers to determine the appropriate size for the interbody prosthesis. We  then pre-filled prosthesis with a combination of local morcellized autograft bone that we obtained  during decompression. We then inserted the prosthesis into the distracted corpectomy site at C4. We then removed the distraction screws. There was a good snug fit of the prosthesis in the interspace.  Having completed the fusion we now turned attention to the anterior spinal instrumentation. We used the high-speed drill to drill away some anterior spondylosis at the disc spaces so that the plate lay down flat. We selected the appropriate length titanium anterior cervical plate. We laid it along the anterior aspect of the vertebral bodies from C3-C5.  We used 1 of the old holes at C5.  We could not use the other hole because of the the plate would have been too crooked.  We then drilled two 14 mm holes at C3 and 1 new 14 minimal hole at C5. We then secured the plate to the vertebral bodies by placing two 14 mm self-tapping screws at C3 and C5. We then obtained intraoperative radiograph. The demonstrating good position of the instrumentation. We therefore secured the screws the plate the locking each cam. This completed the instrumentation.  We filled and the remainder of the corpectomy site with local morselized autograft bone that we obtained during the decompression  We then obtained hemostasis using bipolar electrocautery. We irrigated the wound out with bacitracin solution. We then removed the retractor. We inspected the esophagus for any damage. There was none apparent. We then reapproximated patient's platysmal muscle with interrupted 3-0 Vicryl suture. We then reapproximated the subcutaneous tissue with interrupted 3-0 Vicryl suture. The skin was reapproximated with Steri-Strips and benzoin. The wound was then covered with bacitracin ointment. A sterile dressing was applied. The drapes were removed.  All sponge instrument and needle counts were reportedly correct at the end of this case.

## 2019-01-26 NOTE — Progress Notes (Signed)
Orthopedic Tech Progress Note Patient Details:  Nicolas [name] Oct 20, 1966 [id number]  Patient ID: Nicolas [name], male   DOB: 10-21-1966, 53 y.o.   MRN: [id number]   [name] 01/26/2019, 7:02 PMCalled [employer] for [name] collar.

## 2019-01-26 NOTE — Anesthesia Procedure Notes (Signed)
Arterial Line Insertion Start/End3/12/2018 1:55 PM, 01/26/2019 2:00 PM Performed by: Colin Benton, CRNA, CRNA  Patient location: Pre-op. Preanesthetic checklist: patient identified, IV checked, site marked, risks and benefits discussed, surgical consent, monitors and equipment checked, pre-op evaluation, timeout performed and anesthesia consent Lidocaine 1% used for infiltration Right, radial was placed Catheter size: 20 G Hand hygiene performed , maximum sterile barriers used  and Seldinger technique used Allen's test indicative of satisfactory collateral circulation Attempts: 1 Procedure performed without using ultrasound guided technique. Following insertion, dressing applied and Biopatch. Post procedure assessment: normal and unchanged  Patient tolerated the procedure well with no immediate complications.

## 2019-01-26 NOTE — Transfer of Care (Signed)
Immediate Anesthesia Transfer of Care Note  Patient: Nicolas [name]  Procedure(s) Performed: Cervical Four Corpectomy with Cervical Three-Four, Cervical Four-Five interbody fusion, prosthesis, explore old fusion, possible removal of old plate (N/A )  Patient Location: PACU  Anesthesia Type:General  Level of Consciousness: awake, alert , oriented and patient cooperative  Airway & Oxygen Therapy: Patient Spontanous Breathing and Patient connected to nasal cannula oxygen  Post-op Assessment: Report given to RN and Post -op Vital signs reviewed and stable  Post vital signs: Reviewed and stable  SBP 180, but comfortable and not in pain  Last Vitals:  Vitals Value Taken Time  BP    Temp    Pulse    Resp    SpO2      Last Pain:  Vitals:   01/26/19 1317  TempSrc: Oral  PainSc:          Complications: No apparent anesthesia complications

## 2019-01-27 ENCOUNTER — Encounter (HOSPITAL_COMMUNITY): Payer: Self-pay | Admitting: Neurosurgery

## 2019-01-27 MED ORDER — PNEUMOCOCCAL VAC POLYVALENT 25 MCG/0.5ML IJ INJ
0.5000 mL | INJECTION | INTRAMUSCULAR | Status: AC
  Administered 2019-01-28: 0.5 mL via INTRAMUSCULAR
  Filled 2019-01-27 (×2): qty 0.5

## 2019-01-27 NOTE — Evaluation (Signed)
Physical Therapy Evaluation Patient Details Name: Nicolas [name] MRN: [id number] DOB: [birth year] Today's Date: 01/27/2019   History of Present Illness  53 yo male s/p C3-5 fusion C4 corpectomy PMH: Arthritis, Clotting disorder, Depression, DM, Hypertension, Neuromuscular disorder Peripheral vascular disease, Pulmonary embolism, Sleep apnea, and Spinal stenosis.  Clinical Impression  Patient is s/p above surgery resulting in the deficits listed below (see PT Problem List). Patient with excellent home set up and equip needs. Pt has been compensating well for his bilat LE weakness. Pt with noted R drop foot. WIll plan to discuss/trial AFOs next session. Patient will benefit from skilled PT to increase their independence and safety with mobility (while adhering to their precautions) to allow discharge to the venue listed below.     Follow Up Recommendations Supervision - Intermittent;Outpatient PT(neuro setting)    Equipment Recommendations  3in1 (PT)    Recommendations for Other Services       Precautions / Restrictions Precautions Precautions: Cervical Precaution Booklet Issued: Yes (comment) Precaution Comments: pt with verbal understanding Required Braces or Orthoses: Cervical Brace Cervical Brace: Hard collar;At all times Restrictions Weight Bearing Restrictions: No      Mobility  Bed Mobility Overal bed mobility: Modified Independent             General bed mobility comments: hob elevated (pt with hospital bed at home), able to roll and use bedrail to push self up into sitting without physical assist  Transfers Overall transfer level: Needs assistance Equipment used: Rolling walker (2 wheeled) Transfers: Sit to/from Stand Sit to Stand: Min guard         General transfer comment: verbal cues to push up from bed not walker, pt with wide base of support  Ambulation/Gait Ambulation/Gait assistance: Min guard Gait Distance (Feet): 30 Feet Assistive device: Rolling  walker (2 wheeled) Gait Pattern/deviations: Step-through pattern;Decreased stride length;Steppage;Trunk flexed;Wide base of support Gait velocity: decreased Gait velocity interpretation: <1.31 ft/sec, indicative of household ambulator General Gait Details: pt with minimal sensation of bilat LEs. Pt with noted R foot drop and difficulty with placement as pt typically amb looking down at the floor but now is unable due to neck surgery and c-collar placement, no episodes of instability  Stairs            Wheelchair Mobility    Modified Rankin (Stroke Patients Only)       Balance Overall balance assessment: Needs assistance Sitting-balance support: Feet supported;No upper extremity supported Sitting balance-Leahy Scale: Good     Standing balance support: Bilateral upper extremity supported Standing balance-Leahy Scale: Poor Standing balance comment: dependent on RW                             Pertinent Vitals/Pain Pain Assessment: No/denies pain    Home Living Family/patient expects to be discharged to:: Private residence Living Arrangements: Children Available Help at Discharge: Family;Available PRN/intermittently Type of Home: House Home Access: Ramped entrance     Home Layout: One level Home Equipment: Walker - 2 wheels;Tub bench;Wheelchair - power;Hospital bed;Adaptive equipment Additional Comments: alone during the day    Prior Function Level of Independence: Needs assistance   Gait / Transfers Assistance Needed: only walks in home with RW, uses power chair in community  ADL's / Homemaking Assistance Needed: pt does the cooking/washing dishes from power chair that lifts him up 4'        Hand Dominance   Dominant Hand: Right  Extremity/Trunk Assessment   Upper Extremity Assessment Upper Extremity Assessment: Defer to OT evaluation(reports numbness in bilat pinky fingers) RUE Deficits / Details: reports numbness in the 5th digit but can feel  light touch and 4th digit lateral side only toward 5th digit LUE Deficits / Details: reports numbness in the 5th digit but can feel light touch and 4th digit lateral side only toward 5th digit    Lower Extremity Assessment Lower Extremity Assessment: RLE deficits/detail;LLE deficits/detail RLE Deficits / Details: pt reports numbness t/o LE from hip down, pt with sensation upon deep pressure but not light touch, pt grossly 3-/5 except at ankle, 2/5, pt with noted drop foot RLE Sensation: decreased light touch;decreased proprioception LLE Deficits / Details: pt reports numbness t/o LE from hip down, pt with sensation upon deep pressure but not light touch, pt grossly 3-/5     Cervical / Trunk Assessment Cervical / Trunk Assessment: Other exceptions Cervical / Trunk Exceptions: (x7 previous surgeries and this is number 8 per chart and pt)  Communication   Communication: No difficulties  Cognition Arousal/Alertness: Awake/alert Behavior During Therapy: WFL for tasks assessed/performed Overall Cognitive Status: Within Functional Limits for tasks assessed                                        General Comments General comments (skin integrity, edema, etc.): collar intact, no drainage    Exercises     Assessment/Plan    PT Assessment Patient needs continued PT services  PT Problem List Decreased strength;Decreased range of motion;Decreased balance;Decreased activity tolerance;Decreased mobility;Decreased coordination;Decreased cognition       PT Treatment Interventions DME instruction;Gait training;Stair training;Functional mobility training;Therapeutic activities;Therapeutic exercise;Balance training;Neuromuscular re-education    PT Goals (Current goals can be found in the Care Plan section)  Acute Rehab PT Goals Patient Stated Goal: none stated at this time PT Goal Formulation: With patient Time For Goal Achievement: 02/03/19 Potential to Achieve Goals: Good     Frequency Min 4X/week   Barriers to discharge Decreased caregiver support alone during the day    Co-evaluation               AM-PAC PT "6 Clicks" Mobility  Outcome Measure Help needed turning from your back to your side while in a flat bed without using bedrails?: None Help needed moving from lying on your back to sitting on the side of a flat bed without using bedrails?: None Help needed moving to and from a bed to a chair (including a wheelchair)?: A Little Help needed standing up from a chair using your arms (e.g., wheelchair or bedside chair)?: A Little Help needed to walk in hospital room?: A Little Help needed climbing 3-5 steps with a railing? : A Little 6 Click Score: 20    End of Session Equipment Utilized During Treatment: Gait belt;Cervical collar Activity Tolerance: Patient tolerated treatment well Patient left: in chair;with call bell/phone within reach Nurse Communication: Mobility status PT Visit Diagnosis: Muscle weakness (generalized) (M62.81);Difficulty in walking, not elsewhere classified (R26.2)    Time: 0800-0827 PT Time Calculation (min) (ACUTE ONLY): 27 min   Charges:   PT Evaluation $PT Eval Moderate Complexity: 1 Mod PT Treatments $Gait Training: 8-22 mins        Kittie Plater, PT, DPT Acute Rehabilitation Services Pager #: [phone] Office #: [phone]   [place] Lopes 01/27/2019, 10:46 AM

## 2019-01-27 NOTE — Anesthesia Postprocedure Evaluation (Signed)
Anesthesia Post Note  Patient: LEIGHTON BRICKLEY  Procedure(s) Performed: Cervical Four Corpectomy with Cervical Three-Four, Cervical Four-Five interbody fusion, prosthesis, explore old fusion, possible removal of old plate (N/A Spine Cervical)     Patient location during evaluation: Other Anesthesia Type: General Level of consciousness: awake and alert Pain management: pain level controlled Vital Signs Assessment: post-procedure vital signs reviewed and stable Respiratory status: spontaneous breathing, nonlabored ventilation and respiratory function stable Cardiovascular status: blood pressure returned to baseline and stable Postop Assessment: no apparent nausea or vomiting Anesthetic complications: no    Last Vitals:  Vitals:   01/27/19 0833 01/27/19 1152  BP: (!) 149/68 138/87  Pulse: 87 90  Resp: 18   Temp: 37.1 C 36.9 C  SpO2: 95% 98%    Last Pain:  Vitals:   01/27/19 0833  TempSrc: Oral  PainSc:                  Trase Bunda,W. EDMOND

## 2019-01-27 NOTE — Evaluation (Signed)
Occupational Therapy Evaluation Patient Details Name: Nicolas [name] MRN: [id number] DOB: 03-22-1966 Today's Date: 01/27/2019    History of Present Illness 53 yo male s/p C3-5 fusion C4 corpectomy PMH:  Arthritis, Clotting disorder, Depression, DM, Hypertension, Neuromuscular disorder Peripheral vascular disease, Pulmonary embolism, Sleep apnea, and Spinal stenosis.    Clinical Impression   Patient is s/p C3-5 fusion C4 corpectomy surgery resulting in functional limitations due to the deficits listed below (see OT problem list). Pt currently requires RW for transfers with cues to avoid looking at his feet. Question the need for toe lift due to R foot drop.  Patient will benefit from skilled OT acutely to increase independence and safety with ADLS to allow discharge outpatient neuro follow up for balance.     Follow Up Recommendations  Outpatient OT(Neuro)    Equipment Recommendations  None recommended by OT    Recommendations for Other Services       Precautions / Restrictions Precautions Precautions: Cervical Precaution Booklet Issued: Yes (comment) Precaution Comments: pt with verbal understanding Required Braces or Orthoses: Cervical Brace Cervical Brace: Hard collar;At all times Restrictions Weight Bearing Restrictions: No      Mobility Bed Mobility               General bed mobility comments: in chair on arrival  Transfers Overall transfer level: Needs assistance Equipment used: Rolling walker (2 wheeled) Transfers: Sit to/from Stand Sit to Stand: Supervision              Balance                                           ADL either performed or assessed with clinical judgement   ADL Overall ADL's : Needs assistance/impaired Eating/Feeding: Modified independent   Grooming: Wash/dry hands;Wash/dry face;Oral care;Modified independent;Sitting   Upper Body Bathing: Modified independent;Sitting   Lower Body Bathing: Supervison/  safety;Sit to/from stand Lower Body Bathing Details (indicate cue type and reason): using a long handle sponge at baseline on a bench Upper Body Dressing : Modified independent Upper Body Dressing Details (indicate cue type and reason): Min (A) for brace don doff Lower Body Dressing: Supervision/safety;Sit to/from stand Lower Body Dressing Details (indicate cue type and reason): pt has reacher, sock aide and son at home to help. pt sits on the EOB and hip hikes to reach feet. At this time pt is at baseline level  Toilet Transfer: Supervision/safety;RW           Functional mobility during ADLs: Supervision/safety;Rolling walker General ADL Comments: pt demontrates R foot drop during session and OT notified PT of R foot. QUesiton need for toe lift for safety.      Vision Baseline Vision/History: No visual deficits       Perception     Praxis      Pertinent Vitals/Pain Pain Assessment: No/denies pain     Hand Dominance Right   Extremity/Trunk Assessment Upper Extremity Assessment Upper Extremity Assessment: LUE deficits/detail;RUE deficits/detail RUE Deficits / Details: reports numbness in the 5th digit but can feel light touch and 4th digit lateral side only toward 5th digit LUE Deficits / Details: reports numbness in the 5th digit but can feel light touch and 4th digit lateral side only toward 5th digit   Lower Extremity Assessment Lower Extremity Assessment: RLE deficits/detail RLE Deficits / Details: pt demontrates decr ankle flexion (drop foot) no  previous brace on file    Cervical / Trunk Assessment Cervical / Trunk Assessment: Other exceptions Cervical / Trunk Exceptions: (x7 previous surgeries and this is number 8 per chart and pt)   Communication Communication Communication: No difficulties   Cognition Arousal/Alertness: Awake/alert Behavior During Therapy: WFL for tasks assessed/performed Overall Cognitive Status: Within Functional Limits for tasks assessed                                      General Comments  dry and intact. Brace don doff with OT min (A)> pt educated on changing of pad sin brace    Exercises     Shoulder Instructions      Home Living Family/patient expects to be discharged to:: Private residence Living Arrangements: Children Available Help at Discharge: Family;Available PRN/intermittently Type of Home: House Home Access: Ramped entrance     Home Layout: One level     Bathroom Shower/Tub: Teacher, early years/pre: Standard     Home Equipment: Environmental consultant - 2 wheels;Tub bench;Wheelchair - power;Hospital bed;Adaptive equipment Adaptive Equipment: Reacher Additional Comments: alone during the day      Prior Functioning/Environment Level of Independence: Needs assistance  Gait / Transfers Assistance Needed: only walks in home with RW, uses power chair in community ADL's / Homemaking Assistance Needed: pt does the cooking/washing dishes from power chair that lifts him up 4'            OT Problem List:        OT Treatment/Interventions:      OT Goals(Current goals can be found in the care plan section) Acute Rehab OT Goals Patient Stated Goal: none stated at this time  OT Frequency:     Barriers to D/C:            Co-evaluation              AM-PAC OT "6 Clicks" Daily Activity     Outcome Measure Help from another person eating meals?: A Little Help from another person taking care of personal grooming?: A Little Help from another person toileting, which includes using toliet, bedpan, or urinal?: A Little Help from another person bathing (including washing, rinsing, drying)?: A Little Help from another person to put on and taking off regular upper body clothing?: A Little Help from another person to put on and taking off regular lower body clothing?: A Little 6 Click Score: 18   End of Session Equipment Utilized During Treatment: Rolling walker;Cervical collar Nurse  Communication: Mobility status;Weight bearing status;Precautions  Activity Tolerance: Patient tolerated treatment well Patient left: in chair;with call bell/phone within reach;with chair alarm set  OT Visit Diagnosis: Unsteadiness on feet (R26.81)                Time: 6812-7517 OT Time Calculation (min): 18 min Charges:  OT General Charges $OT Visit: 1 Visit OT Evaluation $OT Eval Moderate Complexity: 1 Mod   Jeri Modena, OTR/L  Acute Rehabilitation Services Pager: [phone] Office: [phone] .   Jeri Modena 01/27/2019, 10:27 AM

## 2019-01-27 NOTE — Progress Notes (Signed)
Subjective: The patient is alert and pleasant.  He is in no apparent distress.  He looks well.  He denies dysphagia.  Objective: Vital signs in last 24 hours: Temp:  [97.3 F (36.3 C)-98.5 F (36.9 C)] 98.4 F (36.9 C) (03/03 0304) Pulse Rate:  [68-97] 80 (03/03 0304) Resp:  [0-25] 13 (03/03 0304) BP: (142-184)/(59-91) 174/77 (03/03 0304) SpO2:  [69 %-99 %] 96 % (03/03 0304) Weight:  [142.9 kg] 142.9 kg (03/02 1319) Estimated body mass index is 45.2 kg/m as calculated from the following:   Height as of this encounter: 5\' 10"  (1.778 m).   Weight as of this encounter: 142.9 kg.   Intake/Output from previous day: 03/02 0701 - 03/03 0700 In: 837 [I.V.:487; IV Piggyback:350] Out: 2525 [Urine:2325; Blood:200] Intake/Output this shift: No intake/output data recorded.  Physical exam patient is alert and pleasant.  He is moving all 4 extremities.  His dressing is clean and dry.  There is no hematoma or shift.  Lab Results: Recent Labs    01/26/19 1302  WBC 6.9  HGB 14.7  HCT 46.6  PLT 192   BMET Recent Labs    01/26/19 1302  NA 139  K 3.8  CL 105  CO2 26  GLUCOSE 130*  BUN 10  CREATININE 0.76  CALCIUM 8.9    Studies/Results: Dg Cervical Spine Complete  Result Date: 01/26/2019 CLINICAL DATA:  C4 corpectomy EXAM: CERVICAL SPINE - COMPLETE 4+ VIEW COMPARISON:  MRI 01/14/2019 FINDINGS: Four lateral images of the cervical spine. Initial image demonstrates anterior localizing device over the C4-C5 disc space. Partially visualized surgical plate at C5. Subsequent images demonstrate C4 corpectomy with placement of cage device. Final image demonstrates anterior plate and screw fixation C3 through C5. Sponge material over the anterior neck soft tissues. IMPRESSION: Lateral views of the cervical spine obtained during C4 corpectomy. Electronically Signed   By: Donavan Foil M.D.   On: 01/26/2019 19:32    Assessment/Plan: Postop day #1: We will mobilize him with PT and OT.  We  will plan to send him home tomorrow.  LOS: 1 day     Ophelia Charter 01/27/2019, 7:37 AM

## 2019-01-28 DIAGNOSIS — Z23 Encounter for immunization: Secondary | ICD-10-CM | POA: Diagnosis not present

## 2019-01-28 MED ORDER — CYCLOBENZAPRINE HCL 10 MG PO TABS: 10.0000 mg | ORAL_TABLET | Freq: Three times a day (TID) | ORAL | 0 refills | Status: DC | PRN

## 2019-01-28 MED ORDER — OXYCODONE HCL 10 MG PO TABS: 10.0000 mg | ORAL_TABLET | ORAL | 0 refills | Status: DC | PRN

## 2019-01-28 MED ORDER — ENOXAPARIN SODIUM 150 MG/ML ~~LOC~~ SOLN
1.0000 mg/kg | Freq: Two times a day (BID) | SUBCUTANEOUS | Status: DC
  Administered 2019-01-28: 145 mg via SUBCUTANEOUS
  Filled 2019-01-28: qty 0.95

## 2019-01-28 MED ORDER — DOCUSATE SODIUM 100 MG PO CAPS: 100.0000 mg | ORAL_CAPSULE | Freq: Two times a day (BID) | ORAL | 0 refills | Status: DC

## 2019-01-28 NOTE — Discharge Summary (Signed)
Physician Discharge Summary  Patient ID: Nicolas [name] MRN: [id number] DOB/AGE: 01-Mar-1966 53 y.o.  Admit date: 01/26/2019 Discharge date: 01/28/2019  Admission Diagnoses: Cervical spondylosis with myelopathy, cervicalgia with cervical spinal stenosis  Discharge Diagnoses: The same Active Problems:   Cervical spondylosis with myelopathy   Discharged Condition: good  Hospital Course: I performed a C4 corpectomy with instrumentation fusion from C3-C5 and removal of his old plate on 06/03/2955.  The surgery went well.  The patient's postoperative course was unremarkable.  On postoperative day #2 he requested discharge to home.  The patient, and his son, were given written and oral discharge instructions.  All their questions were answered.  He was instructed to resume his Coumadin on 01/31/2019.  Consults: Physical therapy, Occupational Therapy Significant Diagnostic Studies: None  Treatments:C4 corpectomy, instrumentation and fusion from C3-C5, exploration of cervical fusion/removal of old cervical plate Discharge Exam: Blood pressure (!) 144/78, pulse 78, temperature 98.3 F (36.8 C), temperature source Oral, resp. rate 16, height 5\' 10"  (1.778 m), weight (!) 142.9 kg, SpO2 99 %. The patient is alert and pleasant.  He looks well.  His dressing is clean and dry.  There is no hematoma or shift.  He is moving all 4 extremities well.  Disposition: Home  Discharge Instructions    Call MD for:  difficulty breathing, headache or visual disturbances   Complete by:  As directed    Call MD for:  extreme fatigue   Complete by:  As directed    Call MD for:  hives   Complete by:  As directed    Call MD for:  persistant dizziness or light-headedness   Complete by:  As directed    Call MD for:  persistant nausea and vomiting   Complete by:  As directed    Call MD for:  redness, tenderness, or signs of infection (pain, swelling, redness, odor or green/yellow discharge around incision site)    Complete by:  As directed    Call MD for:  severe uncontrolled pain   Complete by:  As directed    Call MD for:  temperature >100.4   Complete by:  As directed    Diet - low sodium heart healthy   Complete by:  As directed    Discharge instructions   Complete by:  As directed    Call [phone] for a followup appointment. Take a stool softener while you are using pain medications.   Driving Restrictions   Complete by:  As directed    Do not drive for 2 weeks.   Increase activity slowly   Complete by:  As directed    Lifting restrictions   Complete by:  As directed    Do not lift more than 5 pounds. No excessive bending or twisting.   May shower / Bathe   Complete by:  As directed    Remove the dressing for 3 days after surgery.  You may shower, but leave the incision alone.   Remove dressing in 24 hours   Complete by:  As directed      Allergies as of 01/28/2019      Reactions   Lisinopril Other (See Comments)   cough   Ace Inhibitors Other (See Comments)   cough      Medication List    STOP taking these medications   enoxaparin 150 MG/ML injection Commonly known as:  LOVENOX     TAKE these medications   cyclobenzaprine 10 MG tablet Commonly known as:  FLEXERIL  Take 1 tablet (10 mg total) by mouth 3 (three) times daily as needed for muscle spasms.   docusate sodium 100 MG capsule Commonly known as:  COLACE Take 1 capsule (100 mg total) by mouth 2 (two) times daily.   furosemide 40 MG tablet Commonly known as:  LASIX Take 1 tablet (40 mg total) by mouth daily.   metoprolol succinate 50 MG 24 hr tablet Commonly known as:  TOPROL-XL TAKE 1 TABLET EVERY EVENING WITH OR IMMEDIATELY FOLLOWING A MEAL What changed:  See the new instructions.   multivitamin with minerals Tabs tablet Take 1 tablet by mouth daily. Reported on 11/09/2015   Oxycodone HCl 10 MG Tabs Take 1 tablet (10 mg total) by mouth every 4 (four) hours as needed for severe pain ((score 7 to  10)).   potassium chloride SA 20 MEQ tablet Commonly known as:  K-DUR,KLOR-CON Take 1 tablet (20 mEq total) by mouth as needed (with lasix). What changed:  when to take this   warfarin 10 MG tablet Commonly known as:  COUMADIN Take as directed. If you are unsure how to take this medication, talk to your nurse or doctor. Original instructions:  Take 1/2 to 1 tablet daily as directed by coumadin clinic        Signed: Ophelia Charter 01/28/2019, 2:17 PM

## 2019-01-28 NOTE — Care Management Note (Signed)
Case Management Note  Patient Details  Name: Nicolas [name] MRN: [id number] Date of Birth: 07-11-1966  Subjective/Objective:   53 yo male s/p C3-5 fusion C4 corpectomy.  PTA, pt needs assistance with ADLS; walks with RW at home and uses WC in the community.  He lives with his children.  PCP is [name].               Action/Plan: PT/OT recommending OP follow up, but pt states that MD wants him to wait on OP therapies until after all surgery is completed. (He needs two more.)  I spoke with Dr. Arnoldo Morale and he confirms this.  Family able to assist with care at discharge; pt states he has all needed DME at home.    Expected Discharge Date:  01/28/19               Expected Discharge Plan:  Home/Self Care(HH recommended, but MD states pt should wait until all surgery is complete.)  In-House Referral:     Discharge planning Services  CM Consult  Post Acute Care Choice:    Choice offered to:     DME Arranged:    DME Agency:     HH Arranged:    [place] Agency:     Status of Service:  Completed, signed off  If discussed at [employer], dates discussed:    Additional Comments:  Reinaldo Raddle, RN, BSN  Trauma/Neuro ICU Case Manager [phone]

## 2019-01-28 NOTE — Progress Notes (Signed)
Physical Therapy Treatment Patient Details Name: Nicolas [name] MRN: [id number] DOB: 27-Sep-1966 Today's Date: 01/28/2019    History of Present Illness 53 yo male s/p C3-5 fusion C4 corpectomy PMH: Arthritis, Clotting disorder, Depression, DM, Hypertension, Neuromuscular disorder Peripheral vascular disease, Pulmonary embolism, Sleep apnea, and Spinal stenosis.    PT Comments    Pt with improved mobility and ambulation tolerance today. Provided pt with a toe lift on the R LE and pt reports "I feel like I can trust it more." Pt with improved R LE foot clearance and step length during ambulation using the toe lift. Pt to benefit from HHPT initially for home safety eval and to progress mobility and ADLs in the home while adhering to back precautions.    Follow Up Recommendations  Supervision - Intermittent;Home health PT(initially for safety in home and then outpt)     Equipment Recommendations  3in1 (PT)    Recommendations for Other Services       Precautions / Restrictions Precautions Precautions: Cervical Precaution Booklet Issued: Yes (comment) Precaution Comments: pt able to recall Required Braces or Orthoses: Cervical Brace Cervical Brace: Hard collar;At all times Restrictions Weight Bearing Restrictions: No    Mobility  Bed Mobility Overal bed mobility: Modified Independent             General bed mobility comments: HOB elevated, increased time, pushed up using bed rail  Transfers Overall transfer level: Needs assistance Equipment used: Rolling walker (2 wheeled) Transfers: Sit to/from Stand Sit to Stand: Min guard         General transfer comment: pt with good technique  Ambulation/Gait Ambulation/Gait assistance: Min guard Gait Distance (Feet): 60 Feet Assistive device: Rolling walker (2 wheeled) Gait Pattern/deviations: Step-through pattern;Decreased stride length;Wide base of support;Decreased dorsiflexion - right Gait velocity: decreased Gait  velocity interpretation: <1.31 ft/sec, indicative of household ambulator General Gait Details: pt provided with toe lift for R LE, pt reports "I feel like I can trust that leg more" Pt able to clear both feet and increase step length compared to yesterday despite pt report of having bilat LE numbness. Pt with bilat feet supinated   Stairs             Wheelchair Mobility    Modified Rankin (Stroke Patients Only)       Balance Overall balance assessment: Needs assistance Sitting-balance support: Feet supported;No upper extremity supported Sitting balance-Leahy Scale: Normal Sitting balance - Comments: attempted to don bilat shoes, limited by body habitus and required assist to adhere to no bending   Standing balance support: Bilateral upper extremity supported Standing balance-Leahy Scale: Poor Standing balance comment: dependent on RW                            Cognition Arousal/Alertness: Awake/alert Behavior During Therapy: WFL for tasks assessed/performed Overall Cognitive Status: Within Functional Limits for tasks assessed                                        Exercises      General Comments General comments (skin integrity, edema, etc.): VSS      Pertinent Vitals/Pain Pain Assessment: 0-10 Pain Score: 2  Pain Location: surgical site Pain Descriptors / Indicators: Sore Pain Intervention(s): Monitored during session    Home Living  Prior Function            PT Goals (current goals can now be found in the care plan section) Acute Rehab PT Goals Patient Stated Goal: leave today Progress towards PT goals: Progressing toward goals    Frequency    Min 4X/week      PT Plan Current plan remains appropriate    Co-evaluation              AM-PAC PT "6 Clicks" Mobility   Outcome Measure  Help needed turning from your back to your side while in a flat bed without using bedrails?:  None Help needed moving from lying on your back to sitting on the side of a flat bed without using bedrails?: None Help needed moving to and from a bed to a chair (including a wheelchair)?: A Little Help needed standing up from a chair using your arms (e.g., wheelchair or bedside chair)?: A Little Help needed to walk in hospital room?: A Little Help needed climbing 3-5 steps with a railing? : A Little 6 Click Score: 20    End of Session Equipment Utilized During Treatment: Gait belt;Cervical collar Activity Tolerance: Patient tolerated treatment well Patient left: in chair;with call bell/phone within reach Nurse Communication: Mobility status PT Visit Diagnosis: Muscle weakness (generalized) (M62.81);Difficulty in walking, not elsewhere classified (R26.2)     Time: 2010-0712 PT Time Calculation (min) (ACUTE ONLY): 21 min  Charges:  $Gait Training: 8-22 mins                     Kittie Plater, PT, DPT Acute Rehabilitation Services Pager #: [phone] Office #: [phone]    Berline Lopes 01/28/2019, 8:39 AM

## 2019-02-02 ENCOUNTER — Ambulatory Visit: Payer: Medicare HMO | Admitting: Pharmacist

## 2019-02-02 ENCOUNTER — Other Ambulatory Visit: Payer: Self-pay | Admitting: Family Medicine

## 2019-02-02 DIAGNOSIS — R600 Localized edema: Secondary | ICD-10-CM | POA: Diagnosis not present

## 2019-02-02 DIAGNOSIS — Z86711 Personal history of pulmonary embolism: Secondary | ICD-10-CM

## 2019-02-02 DIAGNOSIS — Z7901 Long term (current) use of anticoagulants: Secondary | ICD-10-CM

## 2019-02-02 DIAGNOSIS — I10 Essential (primary) hypertension: Secondary | ICD-10-CM

## 2019-02-02 LAB — POCT INR: INR: 1.9 — AB (ref 2.0–3.0)

## 2019-02-02 NOTE — Patient Instructions (Signed)
Description   STOP lovenox. Take 1 tablet today then continue taking 1/2 tablet everyday except 1 tablet on Sundays, Tuesdays, and Thursdays.  Recheck INR in 2 weeks. Call us with any changes # [phone].

## 2019-02-09 ENCOUNTER — Encounter (HOSPITAL_COMMUNITY): Payer: Self-pay | Admitting: Neurosurgery

## 2019-02-13 ENCOUNTER — Telehealth: Payer: Self-pay

## 2019-02-13 DIAGNOSIS — Z6841 Body Mass Index (BMI) 40.0 and over, adult: Secondary | ICD-10-CM | POA: Diagnosis not present

## 2019-02-13 DIAGNOSIS — Z86711 Personal history of pulmonary embolism: Secondary | ICD-10-CM | POA: Diagnosis not present

## 2019-02-13 DIAGNOSIS — I10 Essential (primary) hypertension: Secondary | ICD-10-CM | POA: Diagnosis not present

## 2019-02-13 DIAGNOSIS — M4800 Spinal stenosis, site unspecified: Secondary | ICD-10-CM | POA: Diagnosis not present

## 2019-02-13 DIAGNOSIS — R7303 Prediabetes: Secondary | ICD-10-CM | POA: Diagnosis not present

## 2019-02-13 DIAGNOSIS — Z7901 Long term (current) use of anticoagulants: Secondary | ICD-10-CM | POA: Diagnosis not present

## 2019-02-13 DIAGNOSIS — L720 Epidermal cyst: Secondary | ICD-10-CM | POA: Diagnosis not present

## 2019-02-13 NOTE — Telephone Encounter (Signed)

## 2019-02-16 ENCOUNTER — Other Ambulatory Visit: Payer: Self-pay

## 2019-02-16 ENCOUNTER — Ambulatory Visit (INDEPENDENT_AMBULATORY_CARE_PROVIDER_SITE_OTHER): Payer: Medicare HMO | Admitting: *Deleted

## 2019-02-16 DIAGNOSIS — Z7901 Long term (current) use of anticoagulants: Secondary | ICD-10-CM

## 2019-02-16 DIAGNOSIS — R600 Localized edema: Secondary | ICD-10-CM

## 2019-02-16 DIAGNOSIS — Z86711 Personal history of pulmonary embolism: Secondary | ICD-10-CM

## 2019-02-16 LAB — POCT INR: INR: 2.6 (ref 2.0–3.0)

## 2019-02-24 DIAGNOSIS — M4802 Spinal stenosis, cervical region: Secondary | ICD-10-CM | POA: Diagnosis not present

## 2019-03-13 ENCOUNTER — Telehealth: Payer: Self-pay

## 2019-03-13 NOTE — Telephone Encounter (Signed)
lmom for prescreen  

## 2019-03-16 ENCOUNTER — Ambulatory Visit (INDEPENDENT_AMBULATORY_CARE_PROVIDER_SITE_OTHER): Payer: Medicare HMO | Admitting: *Deleted

## 2019-03-16 ENCOUNTER — Other Ambulatory Visit: Payer: Self-pay

## 2019-03-16 DIAGNOSIS — Z7901 Long term (current) use of anticoagulants: Secondary | ICD-10-CM

## 2019-03-16 DIAGNOSIS — R600 Localized edema: Secondary | ICD-10-CM

## 2019-03-16 DIAGNOSIS — Z86711 Personal history of pulmonary embolism: Secondary | ICD-10-CM

## 2019-03-16 LAB — POCT INR: INR: 2.6 (ref 2.0–3.0)

## 2019-03-16 MED ORDER — WARFARIN SODIUM 10 MG PO TABS: ORAL_TABLET | ORAL | 0 refills | Status: DC

## 2019-03-16 NOTE — Patient Instructions (Addendum)
  Description   Spoke with pt and instructed pt to continue taking 1/2 tablet everyday except 1 tablet on Sundays, Tuesdays, and Thursdays. Recheck INR in 4 weeks. Call us with any changes # [phone].

## 2019-03-16 NOTE — Telephone Encounter (Signed)

## 2019-04-10 ENCOUNTER — Telehealth: Payer: Self-pay

## 2019-04-10 NOTE — Telephone Encounter (Signed)
LMOM FOR PRESCREEN  

## 2019-04-10 NOTE — Telephone Encounter (Signed)

## 2019-04-13 ENCOUNTER — Other Ambulatory Visit: Payer: Self-pay

## 2019-04-13 ENCOUNTER — Ambulatory Visit (INDEPENDENT_AMBULATORY_CARE_PROVIDER_SITE_OTHER): Payer: Medicare HMO | Admitting: *Deleted

## 2019-04-13 DIAGNOSIS — Z86711 Personal history of pulmonary embolism: Secondary | ICD-10-CM

## 2019-04-13 DIAGNOSIS — R600 Localized edema: Secondary | ICD-10-CM

## 2019-04-13 DIAGNOSIS — Z7901 Long term (current) use of anticoagulants: Secondary | ICD-10-CM | POA: Diagnosis not present

## 2019-04-13 LAB — POCT INR: INR: 2.6 (ref 2.0–3.0)

## 2019-04-13 NOTE — Patient Instructions (Signed)
Description   Spoke with pt and instructed pt to continue taking 1/2 tablet everyday except 1 tablet on Sundays, Tuesdays, and Thursdays. Recheck INR in 6 weeks. Call us with any changes # [phone].

## 2019-04-21 DIAGNOSIS — M4712 Other spondylosis with myelopathy, cervical region: Secondary | ICD-10-CM | POA: Diagnosis not present

## 2019-04-22 ENCOUNTER — Other Ambulatory Visit: Payer: Self-pay | Admitting: Neurosurgery

## 2019-04-29 ENCOUNTER — Telehealth: Payer: Self-pay | Admitting: *Deleted

## 2019-04-29 NOTE — Telephone Encounter (Signed)
   [facility] Pre-operative Risk Assessment    Request for surgical clearance:  1. What type of surgery is being performed? LAMINECTOMY T5-6, T11-12, L3-4  2. When is this surgery scheduled? 05/14/19  3. What type of clearance is required (medical clearance vs. Pharmacy clearance to hold med vs. Both)? BOTH  4. Are there any medications that need to be held prior to surgery and how long? WARFARIN  5. Practice name and name of physician performing surgery? [facility]; DR. JEFFREY JENKINS  6. What is your office phone number [phone]   7.   What is your office fax number [phone]  8.   Anesthesia type (None, local, MAC, general) ? GENERAL   Nicolas [name] 04/29/2019, 11:03 AM  _________________________________________________________________   (provider comments below)

## 2019-04-29 NOTE — Telephone Encounter (Signed)
Patient with diagnosis of clotting disorder with PE/DVT on warfarin for anticoagulation.    Procedure: laminectomy  Date of procedure: 05/14/19  With normal renal function Platelet count 192k/uL  Per office protocol, patient can hold warfarin for 5 days prior to procedure WITH bridging with Lovenox (enoxaparin) around procedure.  Pt scheduled for lovenox instructions.

## 2019-04-30 ENCOUNTER — Telehealth: Payer: Self-pay

## 2019-04-30 NOTE — Telephone Encounter (Signed)
   Primary Cardiologist: Lauree Chandler, MD  Chart reviewed and patient contacted today by phone as part of pre-operative protocol coverage. He denies any chest pain, SOB, or tachycardia since we last contacted him. Given past medical history and time since last visit, based on ACC/AHA guidelines, URAL ACREE would be at acceptable risk for the planned procedure without further cardiovascular testing.   Pt can hold warfarin 5 days pre op but he will require Lovenox bridging.  Our pharmacy has contacted him about this.   I will route this recommendation to the requesting party via Epic fax function and remove from pre-op pool.  Please call with questions.  Kerin Ransom, PA-C 04/30/2019, 9:38 AM

## 2019-04-30 NOTE — Telephone Encounter (Signed)

## 2019-05-01 ENCOUNTER — Other Ambulatory Visit: Payer: Self-pay | Admitting: Neurosurgery

## 2019-05-02 ENCOUNTER — Other Ambulatory Visit: Payer: Self-pay | Admitting: Cardiovascular Disease

## 2019-05-07 ENCOUNTER — Ambulatory Visit (INDEPENDENT_AMBULATORY_CARE_PROVIDER_SITE_OTHER): Payer: Medicare HMO | Admitting: *Deleted

## 2019-05-07 ENCOUNTER — Other Ambulatory Visit: Payer: Self-pay

## 2019-05-07 DIAGNOSIS — Z7901 Long term (current) use of anticoagulants: Secondary | ICD-10-CM | POA: Diagnosis not present

## 2019-05-07 DIAGNOSIS — R600 Localized edema: Secondary | ICD-10-CM

## 2019-05-07 DIAGNOSIS — Z86711 Personal history of pulmonary embolism: Secondary | ICD-10-CM

## 2019-05-07 LAB — POCT INR: INR: 1.9 — AB (ref 2.0–3.0)

## 2019-05-07 MED ORDER — ENOXAPARIN SODIUM 150 MG/ML ~~LOC~~ SOLN: 150.0000 mg | Freq: Two times a day (BID) | SUBCUTANEOUS | 1 refills | Status: DC

## 2019-05-07 NOTE — Patient Instructions (Addendum)
  Description   Start taking Lovenox injections and refer to pt instructions  Normal dose: 1/2 tablet everyday except 1 tablet on Sundays, Tuesdays, and Thursdays. Recheck INR in 1 week after procedure. Call us with any changes # [phone].      05/08/2019:  Start Lovenox 150mg  in the fatty abdominal tissue at least 2 inches from the at 8pm rotate sites. No Coumadin.  05/09/2019: Inject Lovenox in the fatty abdominal tissue at least 2 inches from the belly button twice a day about 12 hours apart, 8am and 8pm rotate sites. No Coumadin.  05/10/2019: Inject Lovenox in the fatty abdominal tissue at least 2 inches from the belly button twice a day about 12 hours apart, 8am and 8pm rotate sites. No Coumadin.  05/11/2019: Inject Lovenox in the fatty tissue every 12 hours, 8am and 8pm. No Coumadin.  05/12/2019: Inject Lovenox in the fatty tissue every 12 hours, 8am and 8pm. No Coumadin.  05/13/2019: Inject Lovenox in the fatty tissue in the morning at 8 am (No PM dose). No Coumadin.  05/14/2019: Procedure Day - No Lovenox - Resume Coumadin in the evening or as directed by doctor.  05/15/2019: Resume Lovenox inject in the fatty tissue every 12 hours and take Coumadin.  05/16/2019: Inject Lovenox in the fatty tissue every 12 hours and take Coumadin.  05/17/2019: Inject Lovenox in the fatty tissue every 12 hours and take Coumadin.  05/18/2019: Inject Lovenox in the fatty tissue every 12 hours and take Coumadin.  05/19/2019: Inject Lovenox in the fatty tissue every 12 hours and take Coumadin.  05/20/2019: Inject Lovenox in the fatty tissue every 12 hours and take Coumadin.  05/21/2019: Coumadin appt to check INR.

## 2019-05-08 NOTE — Pre-Procedure Instructions (Signed)
[facility] [place], [address] [place] [address] Phone: [phone] Fax: [facility] [place], [place] [address] Phone: [phone] Fax: [phone]      Your procedure is scheduled on 05-14-19 Thursday  Report to [facility] Main Entrance "A" at 1130A.M., and check in at the Admitting office.  Call this number if you have problems the morning of surgery:  [phone]  Call [phone] if you have any questions prior to your surgery date Monday-Friday 8am-4pm    Remember:  Do not eat or drink after midnight.  Take these medicines the morning of surgery with A SIP OF WATER: metoprolol succinate (TOPROL-XL)   .Follow your surgeon's instructions on when to stop Warfarin.  If no instructions were given by your surgeon then you will need to call the office to get those instructions.     7 days prior to surgery STOP taking any Aspirin (unless otherwise instructed by your surgeon), Aleve, Naproxen, Ibuprofen, Motrin, Advil, Goody's, BC's, all herbal medications, fish oil, and all vitamins.   WHAT DO I DO ABOUT MY DIABETES MEDICATION?   Marland Kitchen Do not take oral diabetes medicines (pills) the morning of surgery.  How to Manage Your Diabetes Before and After Surgery  Why is it important to control my blood sugar before and after surgery? . Improving blood sugar levels before and after surgery helps healing and can limit problems. . A way of improving blood sugar control is eating a healthy diet by: o  Eating less sugar and carbohydrates o  Increasing activity/exercise o  Talking with your doctor about reaching your blood sugar goals . High blood sugars (greater than 180 mg/dL) can raise your risk of infections and slow your recovery, so you will need to focus on controlling your diabetes during the weeks  before surgery. . Make sure that the doctor who takes care of your diabetes knows about your planned surgery including the date and location.  How do I manage my blood sugar before surgery? . Check your blood sugar at least 4 times a day, starting 2 days before surgery, to make sure that the level is not too high or low. o Check your blood sugar the morning of your surgery when you wake up and every 2 hours until you get to the Short Stay unit. . If your blood sugar is less than 70 mg/dL, you will need to treat for low blood sugar: o Do not take insulin. o Treat a low blood sugar (less than 70 mg/dL) with  cup of clear juice (cranberry or apple), 4 glucose tablets, OR glucose gel. o Recheck blood sugar in 15 minutes after treatment (to make sure it is greater than 70 mg/dL). If your blood sugar is not greater than 70 mg/dL on recheck, call [phone] for further instructions. . Report your blood sugar to the short stay nurse when you get to Short Stay.  . If you are admitted to the hospital after surgery: o Your blood sugar will be checked by the staff and you will probably be given insulin after surgery (instead of oral diabetes medicines) to make sure you have good blood sugar levels. o The goal for blood sugar control after surgery is 80-180 mg/dL.  The Morning of Surgery  Do not wear jewelry, make-up or nail polish.  Do  not wear lotions, powders, or perfumes/colognes, or deodorant  Do not shave 48 hours prior to surgery.  Men may shave face and neck.  Do not bring valuables to the hospital.  [facility] is not responsible for any belongings or valuables.  If you are a smoker, DO NOT Smoke 24 hours prior to surgery IF you wear a CPAP at night please bring your mask, tubing, and machine the morning of surgery   Remember that you must have someone to transport you home after your surgery, and remain with you for 24 hours if you are discharged the same day.   Contacts, glasses,  hearing aids, dentures or bridgework may not be worn into surgery.    Leave your suitcase in the car.  After surgery it may be brought to your room.  For patients admitted to the hospital, discharge time will be determined by your treatment team.  Patients discharged the day of surgery will not be allowed to drive home.    Special instructions:   [facility]- Preparing For Surgery  Before surgery, you can play an important role. Because skin is not sterile, your skin needs to be as free of germs as possible. You can reduce the number of germs on your skin by washing with CHG (chlorahexidine gluconate) Soap before surgery.  CHG is an antiseptic cleaner which kills germs and bonds with the skin to continue killing germs even after washing.    Oral Hygiene is also important to reduce your risk of infection.  Remember - BRUSH YOUR TEETH THE MORNING OF SURGERY WITH YOUR REGULAR TOOTHPASTE  Please do not use if you have an allergy to CHG or antibacterial soaps. If your skin becomes reddened/irritated stop using the CHG.  Do not shave (including legs and underarms) for at least 48 hours prior to first CHG shower. It is OK to shave your face.  Please follow these instructions carefully.   1. Shower the NIGHT BEFORE SURGERY and the MORNING OF SURGERY with CHG Soap.   2. If you chose to wash your hair, wash your hair first as usual with your normal shampoo.  3. After you shampoo, rinse your hair and body thoroughly to remove the shampoo.  4. Use CHG as you would any other liquid soap. You can apply CHG directly to the skin and wash gently with a scrungie or a clean washcloth.   5. Apply the CHG Soap to your body ONLY FROM THE NECK DOWN.  Do not use on open wounds or open sores. Avoid contact with your eyes, ears, mouth and genitals (private parts). Wash Face and genitals (private parts)  with your normal soap.   6. Wash thoroughly, paying special attention to the area where your surgery will be  performed.  7. Thoroughly rinse your body with warm water from the neck down.  8. DO NOT shower/wash with your normal soap after using and rinsing off the CHG Soap.  9. Pat yourself dry with a CLEAN TOWEL.  10. Wear CLEAN PAJAMAS to bed the night before surgery, wear comfortable clothes the morning of surgery  11. Place CLEAN SHEETS on your bed the night of your first shower and DO NOT SLEEP WITH PETS.    Day of Surgery:  Do not apply any deodorants/lotions.  Please wear clean clothes to the hospital/surgery center.   Remember to brush your teeth WITH YOUR REGULAR TOOTHPASTE.   Please read over the following fact sheets that you were given.

## 2019-05-11 ENCOUNTER — Encounter (HOSPITAL_COMMUNITY): Payer: Self-pay

## 2019-05-11 ENCOUNTER — Other Ambulatory Visit: Payer: Self-pay

## 2019-05-11 ENCOUNTER — Other Ambulatory Visit (HOSPITAL_COMMUNITY)
Admission: RE | Admit: 2019-05-11 | Discharge: 2019-05-11 | Disposition: A | Discharge status: Home / Self Care | Payer: Medicare HMO | Source: Ambulatory Visit | Attending: Neurosurgery | Admitting: Neurosurgery

## 2019-05-11 ENCOUNTER — Encounter (HOSPITAL_COMMUNITY)
Admission: RE | Admit: 2019-05-11 | Discharge: 2019-05-11 | Disposition: A | Discharge status: Home / Self Care | Payer: Medicare HMO | Source: Ambulatory Visit | Attending: Neurosurgery | Admitting: Neurosurgery

## 2019-05-11 DIAGNOSIS — G8250 Quadriplegia, unspecified: Secondary | ICD-10-CM | POA: Diagnosis present

## 2019-05-11 DIAGNOSIS — I2782 Chronic pulmonary embolism: Secondary | ICD-10-CM | POA: Diagnosis not present

## 2019-05-11 DIAGNOSIS — M1990 Unspecified osteoarthritis, unspecified site: Secondary | ICD-10-CM | POA: Diagnosis present

## 2019-05-11 DIAGNOSIS — Z8249 Family history of ischemic heart disease and other diseases of the circulatory system: Secondary | ICD-10-CM | POA: Diagnosis not present

## 2019-05-11 DIAGNOSIS — G8220 Paraplegia, unspecified: Secondary | ICD-10-CM | POA: Diagnosis not present

## 2019-05-11 DIAGNOSIS — I498 Other specified cardiac arrhythmias: Secondary | ICD-10-CM | POA: Insufficient documentation

## 2019-05-11 DIAGNOSIS — G959 Disease of spinal cord, unspecified: Secondary | ICD-10-CM | POA: Diagnosis present

## 2019-05-11 DIAGNOSIS — E871 Hypo-osmolality and hyponatremia: Secondary | ICD-10-CM | POA: Diagnosis not present

## 2019-05-11 DIAGNOSIS — Z801 Family history of malignant neoplasm of trachea, bronchus and lung: Secondary | ICD-10-CM | POA: Diagnosis not present

## 2019-05-11 DIAGNOSIS — Z01818 Encounter for other preprocedural examination: Secondary | ICD-10-CM | POA: Insufficient documentation

## 2019-05-11 DIAGNOSIS — Z833 Family history of diabetes mellitus: Secondary | ICD-10-CM | POA: Diagnosis not present

## 2019-05-11 DIAGNOSIS — G992 Myelopathy in diseases classified elsewhere: Secondary | ICD-10-CM | POA: Diagnosis not present

## 2019-05-11 DIAGNOSIS — E669 Obesity, unspecified: Secondary | ICD-10-CM | POA: Diagnosis present

## 2019-05-11 DIAGNOSIS — Z888 Allergy status to other drugs, medicaments and biological substances status: Secondary | ICD-10-CM | POA: Diagnosis not present

## 2019-05-11 DIAGNOSIS — M5416 Radiculopathy, lumbar region: Secondary | ICD-10-CM | POA: Diagnosis present

## 2019-05-11 DIAGNOSIS — Z79899 Other long term (current) drug therapy: Secondary | ICD-10-CM | POA: Diagnosis not present

## 2019-05-11 DIAGNOSIS — I10 Essential (primary) hypertension: Secondary | ICD-10-CM | POA: Diagnosis not present

## 2019-05-11 DIAGNOSIS — M21371 Foot drop, right foot: Secondary | ICD-10-CM | POA: Diagnosis present

## 2019-05-11 DIAGNOSIS — K5903 Drug induced constipation: Secondary | ICD-10-CM | POA: Diagnosis not present

## 2019-05-11 DIAGNOSIS — K592 Neurogenic bowel, not elsewhere classified: Secondary | ICD-10-CM | POA: Diagnosis not present

## 2019-05-11 DIAGNOSIS — G9741 Accidental puncture or laceration of dura during a procedure: Secondary | ICD-10-CM | POA: Diagnosis not present

## 2019-05-11 DIAGNOSIS — K5900 Constipation, unspecified: Secondary | ICD-10-CM | POA: Diagnosis not present

## 2019-05-11 DIAGNOSIS — Z8042 Family history of malignant neoplasm of prostate: Secondary | ICD-10-CM | POA: Diagnosis not present

## 2019-05-11 DIAGNOSIS — M4714 Other spondylosis with myelopathy, thoracic region: Secondary | ICD-10-CM | POA: Diagnosis not present

## 2019-05-11 DIAGNOSIS — Z1159 Encounter for screening for other viral diseases: Secondary | ICD-10-CM | POA: Diagnosis not present

## 2019-05-11 DIAGNOSIS — M48062 Spinal stenosis, lumbar region with neurogenic claudication: Secondary | ICD-10-CM | POA: Diagnosis present

## 2019-05-11 DIAGNOSIS — Z7901 Long term (current) use of anticoagulants: Secondary | ICD-10-CM | POA: Diagnosis not present

## 2019-05-11 DIAGNOSIS — M5126 Other intervertebral disc displacement, lumbar region: Secondary | ICD-10-CM | POA: Diagnosis not present

## 2019-05-11 DIAGNOSIS — M4804 Spinal stenosis, thoracic region: Secondary | ICD-10-CM | POA: Diagnosis present

## 2019-05-11 DIAGNOSIS — R7309 Other abnormal glucose: Secondary | ICD-10-CM | POA: Diagnosis not present

## 2019-05-11 DIAGNOSIS — M48061 Spinal stenosis, lumbar region without neurogenic claudication: Secondary | ICD-10-CM | POA: Diagnosis not present

## 2019-05-11 DIAGNOSIS — Z86718 Personal history of other venous thrombosis and embolism: Secondary | ICD-10-CM | POA: Diagnosis not present

## 2019-05-11 DIAGNOSIS — G4730 Sleep apnea, unspecified: Secondary | ICD-10-CM | POA: Diagnosis present

## 2019-05-11 DIAGNOSIS — Y838 Other surgical procedures as the cause of abnormal reaction of the patient, or of later complication, without mention of misadventure at the time of the procedure: Secondary | ICD-10-CM | POA: Diagnosis not present

## 2019-05-11 DIAGNOSIS — Z6841 Body Mass Index (BMI) 40.0 and over, adult: Secondary | ICD-10-CM | POA: Diagnosis not present

## 2019-05-11 DIAGNOSIS — M47814 Spondylosis without myelopathy or radiculopathy, thoracic region: Secondary | ICD-10-CM | POA: Diagnosis not present

## 2019-05-11 DIAGNOSIS — Z981 Arthrodesis status: Secondary | ICD-10-CM | POA: Diagnosis not present

## 2019-05-11 DIAGNOSIS — Z993 Dependence on wheelchair: Secondary | ICD-10-CM | POA: Diagnosis not present

## 2019-05-11 DIAGNOSIS — F329 Major depressive disorder, single episode, unspecified: Secondary | ICD-10-CM | POA: Diagnosis present

## 2019-05-11 DIAGNOSIS — M47816 Spondylosis without myelopathy or radiculopathy, lumbar region: Secondary | ICD-10-CM | POA: Diagnosis not present

## 2019-05-11 DIAGNOSIS — E1169 Type 2 diabetes mellitus with other specified complication: Secondary | ICD-10-CM | POA: Diagnosis not present

## 2019-05-11 DIAGNOSIS — Z87891 Personal history of nicotine dependence: Secondary | ICD-10-CM | POA: Diagnosis not present

## 2019-05-11 DIAGNOSIS — Z4789 Encounter for other orthopedic aftercare: Secondary | ICD-10-CM | POA: Diagnosis not present

## 2019-05-11 HISTORY — DX: Other specified postprocedural states: Z98.890

## 2019-05-11 HISTORY — DX: Nausea with vomiting, unspecified: R11.2

## 2019-05-11 LAB — COMPREHENSIVE METABOLIC PANEL
ALT: 35 U/L (ref 0–44)
AST: 31 U/L (ref 15–41)
Albumin: 3.5 g/dL (ref 3.5–5.0)
Alkaline Phosphatase: 61 U/L (ref 38–126)
Anion gap: 6 (ref 5–15)
BUN: 11 mg/dL (ref 6–20)
CO2: 29 mmol/L (ref 22–32)
Calcium: 9.3 mg/dL (ref 8.9–10.3)
Chloride: 103 mmol/L (ref 98–111)
Creatinine, Ser: 0.74 mg/dL (ref 0.61–1.24)
GFR calc Af Amer: >60 mL/min (ref >60)
GFR calc non Af Amer: >60 mL/min (ref >60)
Glucose, Bld: 107 mg/dL — ABNORMAL HIGH (ref 70–99)
Potassium: 4 mmol/L (ref 3.5–5.1)
Sodium: 138 mmol/L (ref 135–145)
Total Bilirubin: 1.2 mg/dL (ref 0.3–1.2)
Total Protein: 7.3 g/dL (ref 6.5–8.1)

## 2019-05-11 LAB — HEMOGLOBIN A1C
Hgb A1c MFr Bld: 7.4 % — ABNORMAL HIGH (ref 4.8–5.6)
Mean Plasma Glucose: 165.68 mg/dL

## 2019-05-11 LAB — GLUCOSE, CAPILLARY: Glucose-Capillary: 112 mg/dL — ABNORMAL HIGH (ref 70–99)

## 2019-05-11 LAB — CBC
HCT: 44.8 % (ref 39.0–52.0)
Hemoglobin: 14.4 g/dL (ref 13.0–17.0)
MCH: 30.1 pg (ref 26.0–34.0)
MCHC: 32.1 g/dL (ref 30.0–36.0)
MCV: 93.5 fL (ref 80.0–100.0)
Platelets: 199 K/uL (ref 150–400)
RBC: 4.79 MIL/uL (ref 4.22–5.81)
RDW: 14.3 % (ref 11.5–15.5)
WBC: 6.6 K/uL (ref 4.0–10.5)
nRBC: 0 % (ref 0.0–0.2)

## 2019-05-11 LAB — SURGICAL PCR SCREEN
MRSA, PCR: NEGATIVE
Staphylococcus aureus: NEGATIVE

## 2019-05-11 NOTE — Progress Notes (Signed)
  Coronavirus Screening Was tested for COVID today at [facility] Have you experienced the following symptoms:  Cough yes/no: No Fever (>100.33F)  yes/no: No Runny nose yes/no: No Sore throat yes/no: No Difficulty breathing/shortness of breath  yes/no: No No loss of smell or taste-No Have you or a family member traveled in the last 14 days and where? yes/no: No   PCP - [name]   Cardiologist - D. Mcacalhany  Chest x-ray - NA  EKG - 05-11-19  Stress Test - denies  ECHO - 05-23-14  Cardiac Cath - denies  AICD-denies PM-denies LOOP-denies Sleep Study - 04-02-14 Yes to apnea. Does not use CPAP CPAP - N  LABS-CBC,CMP,A1C,PCR  ASA-denies  Warfarin-LD 05-07-19 Bridged to Lovenox. Last day will be 05-13-19  ERAS-NA  HA1C- Fasting Blood Sugar - 112. Pt cannot recall lowest or highest CBG's Checks Blood Sugar __0___ times a day  Anesthesia-Y Cardiac risk  Pt denies having chest pain, sob, or fever at this time. All instructions explained to the pt, with a verbal understanding of the material. Pt agrees to go over the instructions while at home for a better understanding. The opportunity to ask questions was provided.

## 2019-05-12 NOTE — Anesthesia Preprocedure Evaluation (Addendum)
Anesthesia Evaluation  Patient identified by MRN, date of birth, ID band Patient awake    Reviewed: Allergy & Precautions, NPO status , Patient's Chart, lab work & pertinent test results, reviewed documented beta blocker date and time   History of Anesthesia Complications (+) PONV and history of anesthetic complications  Airway Mallampati: II  TM Distance: >3 FB Neck ROM: Full    Dental  (+) Teeth Intact, Dental Advisory Given   Pulmonary sleep apnea , former smoker, PE   Pulmonary exam normal breath sounds clear to auscultation       Cardiovascular hypertension, Pt. on home beta blockers + Peripheral Vascular Disease and + DVT  Normal cardiovascular exam Rhythm:Regular Rate:Normal     Neuro/Psych PSYCHIATRIC DISORDERS Depression  Neuromuscular disease    GI/Hepatic negative GI ROS, Neg liver ROS,   Endo/Other  diabetesMorbid obesity  Renal/GU negative Renal ROS     Musculoskeletal  (+) Arthritis ,   Abdominal   Peds  Hematology  (+) Blood dyscrasia (Lovenox), ,   Anesthesia Other Findings Day of surgery medications reviewed with the patient.  Reproductive/Obstetrics                           Anesthesia Physical Anesthesia Plan  ASA: III  Anesthesia Plan: General   Post-op Pain Management:    Induction: Intravenous  PONV Risk Score and Plan: 3 and Midazolam, Dexamethasone and Ondansetron  Airway Management Planned: Oral ETT  Additional Equipment:   Intra-op Plan:   Post-operative Plan: Extubation in OR  Informed Consent: I have reviewed the patients History and Physical, chart, labs and discussed the procedure including the risks, benefits and alternatives for the proposed anesthesia with the patient or authorized representative who has indicated his/her understanding and acceptance.     Dental advisory given  Plan Discussed with: CRNA  Anesthesia Plan Comments: (See  recent PAT note 01/13/19 by Myra Gianotti, PA-C. No significant interim health changes. He was cleared by cardiology in telephone encounter 04/30/19 with recommendation for lovenox bridge which pt is doing as per pharmacy recs.)       Anesthesia Quick Evaluation

## 2019-05-13 LAB — NOVEL CORONAVIRUS, NAA (HOSP ORDER, SEND-OUT TO REF LAB; TAT 18-24 HRS): SARS-CoV-2, NAA: NOT DETECTED

## 2019-05-13 MED ORDER — DEXTROSE 5 % IV SOLN
3.0000 g | INTRAVENOUS | Status: AC
  Administered 2019-05-14: 3 g via INTRAVENOUS
  Filled 2019-05-13: qty 3

## 2019-05-14 ENCOUNTER — Ambulatory Visit (HOSPITAL_COMMUNITY): Payer: Medicare HMO | Admitting: Certified Registered Nurse Anesthetist

## 2019-05-14 ENCOUNTER — Ambulatory Visit (HOSPITAL_COMMUNITY): Payer: Medicare HMO

## 2019-05-14 ENCOUNTER — Inpatient Hospital Stay (HOSPITAL_COMMUNITY)
Admission: AD | Admit: 2019-05-14 | Discharge: 2019-05-21 | DRG: 518 | Disposition: A | Discharge status: Rehabilitation Facility | Payer: Medicare HMO | Attending: Neurosurgery | Admitting: Neurosurgery

## 2019-05-14 ENCOUNTER — Encounter (HOSPITAL_COMMUNITY): Payer: Self-pay

## 2019-05-14 ENCOUNTER — Ambulatory Visit (HOSPITAL_COMMUNITY): Payer: Medicare HMO | Admitting: Physician Assistant

## 2019-05-14 ENCOUNTER — Other Ambulatory Visit: Payer: Self-pay

## 2019-05-14 ENCOUNTER — Telehealth: Payer: Self-pay

## 2019-05-14 ENCOUNTER — Inpatient Hospital Stay (HOSPITAL_COMMUNITY): Payer: Medicare HMO

## 2019-05-14 ENCOUNTER — Encounter (HOSPITAL_COMMUNITY)
Admission: AD | Disposition: A | Discharge status: Rehabilitation Facility | Payer: Self-pay | Source: Home / Self Care | Attending: Neurosurgery

## 2019-05-14 DIAGNOSIS — Z86711 Personal history of pulmonary embolism: Secondary | ICD-10-CM

## 2019-05-14 DIAGNOSIS — G959 Disease of spinal cord, unspecified: Secondary | ICD-10-CM | POA: Diagnosis present

## 2019-05-14 DIAGNOSIS — Z1159 Encounter for screening for other viral diseases: Secondary | ICD-10-CM | POA: Diagnosis not present

## 2019-05-14 DIAGNOSIS — G4730 Sleep apnea, unspecified: Secondary | ICD-10-CM | POA: Diagnosis present

## 2019-05-14 DIAGNOSIS — Z8042 Family history of malignant neoplasm of prostate: Secondary | ICD-10-CM | POA: Diagnosis not present

## 2019-05-14 DIAGNOSIS — Z981 Arthrodesis status: Secondary | ICD-10-CM | POA: Diagnosis not present

## 2019-05-14 DIAGNOSIS — Z8249 Family history of ischemic heart disease and other diseases of the circulatory system: Secondary | ICD-10-CM | POA: Diagnosis not present

## 2019-05-14 DIAGNOSIS — G9741 Accidental puncture or laceration of dura during a procedure: Secondary | ICD-10-CM | POA: Diagnosis not present

## 2019-05-14 DIAGNOSIS — Z7901 Long term (current) use of anticoagulants: Secondary | ICD-10-CM | POA: Diagnosis not present

## 2019-05-14 DIAGNOSIS — M5416 Radiculopathy, lumbar region: Secondary | ICD-10-CM | POA: Diagnosis present

## 2019-05-14 DIAGNOSIS — Z79899 Other long term (current) drug therapy: Secondary | ICD-10-CM

## 2019-05-14 DIAGNOSIS — F329 Major depressive disorder, single episode, unspecified: Secondary | ICD-10-CM | POA: Diagnosis present

## 2019-05-14 DIAGNOSIS — M4714 Other spondylosis with myelopathy, thoracic region: Secondary | ICD-10-CM | POA: Diagnosis present

## 2019-05-14 DIAGNOSIS — G8220 Paraplegia, unspecified: Secondary | ICD-10-CM | POA: Diagnosis not present

## 2019-05-14 DIAGNOSIS — G8250 Quadriplegia, unspecified: Secondary | ICD-10-CM | POA: Diagnosis present

## 2019-05-14 DIAGNOSIS — Z419 Encounter for procedure for purposes other than remedying health state, unspecified: Secondary | ICD-10-CM

## 2019-05-14 DIAGNOSIS — Z86718 Personal history of other venous thrombosis and embolism: Secondary | ICD-10-CM | POA: Diagnosis not present

## 2019-05-14 DIAGNOSIS — Z801 Family history of malignant neoplasm of trachea, bronchus and lung: Secondary | ICD-10-CM

## 2019-05-14 DIAGNOSIS — Z888 Allergy status to other drugs, medicaments and biological substances status: Secondary | ICD-10-CM

## 2019-05-14 DIAGNOSIS — Z993 Dependence on wheelchair: Secondary | ICD-10-CM | POA: Diagnosis not present

## 2019-05-14 DIAGNOSIS — E1151 Type 2 diabetes mellitus with diabetic peripheral angiopathy without gangrene: Secondary | ICD-10-CM | POA: Diagnosis present

## 2019-05-14 DIAGNOSIS — M21371 Foot drop, right foot: Secondary | ICD-10-CM | POA: Diagnosis present

## 2019-05-14 DIAGNOSIS — Y838 Other surgical procedures as the cause of abnormal reaction of the patient, or of later complication, without mention of misadventure at the time of the procedure: Secondary | ICD-10-CM | POA: Diagnosis not present

## 2019-05-14 DIAGNOSIS — M1990 Unspecified osteoarthritis, unspecified site: Secondary | ICD-10-CM | POA: Diagnosis present

## 2019-05-14 DIAGNOSIS — M48062 Spinal stenosis, lumbar region with neurogenic claudication: Secondary | ICD-10-CM | POA: Diagnosis present

## 2019-05-14 DIAGNOSIS — Z9049 Acquired absence of other specified parts of digestive tract: Secondary | ICD-10-CM

## 2019-05-14 DIAGNOSIS — K5900 Constipation, unspecified: Secondary | ICD-10-CM | POA: Diagnosis not present

## 2019-05-14 DIAGNOSIS — Z6841 Body Mass Index (BMI) 40.0 and over, adult: Secondary | ICD-10-CM | POA: Diagnosis not present

## 2019-05-14 DIAGNOSIS — Z4789 Encounter for other orthopedic aftercare: Secondary | ICD-10-CM | POA: Diagnosis not present

## 2019-05-14 DIAGNOSIS — M4804 Spinal stenosis, thoracic region: Secondary | ICD-10-CM | POA: Diagnosis present

## 2019-05-14 DIAGNOSIS — E669 Obesity, unspecified: Secondary | ICD-10-CM | POA: Diagnosis present

## 2019-05-14 DIAGNOSIS — Z87891 Personal history of nicotine dependence: Secondary | ICD-10-CM

## 2019-05-14 DIAGNOSIS — E871 Hypo-osmolality and hyponatremia: Secondary | ICD-10-CM | POA: Diagnosis not present

## 2019-05-14 DIAGNOSIS — E1169 Type 2 diabetes mellitus with other specified complication: Secondary | ICD-10-CM | POA: Diagnosis not present

## 2019-05-14 DIAGNOSIS — K592 Neurogenic bowel, not elsewhere classified: Secondary | ICD-10-CM | POA: Diagnosis not present

## 2019-05-14 DIAGNOSIS — I2782 Chronic pulmonary embolism: Secondary | ICD-10-CM | POA: Diagnosis not present

## 2019-05-14 DIAGNOSIS — Z833 Family history of diabetes mellitus: Secondary | ICD-10-CM | POA: Diagnosis not present

## 2019-05-14 DIAGNOSIS — I10 Essential (primary) hypertension: Secondary | ICD-10-CM | POA: Diagnosis present

## 2019-05-14 DIAGNOSIS — R7309 Other abnormal glucose: Secondary | ICD-10-CM | POA: Diagnosis not present

## 2019-05-14 DIAGNOSIS — K5903 Drug induced constipation: Secondary | ICD-10-CM | POA: Diagnosis not present

## 2019-05-14 HISTORY — PX: LUMBAR LAMINECTOMY/DECOMPRESSION MICRODISCECTOMY: SHX5026

## 2019-05-14 LAB — PROTIME-INR
INR: 1 (ref 0.8–1.2)
Prothrombin Time: 13.1 seconds (ref 11.4–15.2)

## 2019-05-14 LAB — GLUCOSE, CAPILLARY
Glucose-Capillary: 135 mg/dL — ABNORMAL HIGH (ref 70–99)
Glucose-Capillary: 201 mg/dL — ABNORMAL HIGH (ref 70–99)
Glucose-Capillary: 240 mg/dL — ABNORMAL HIGH (ref 70–99)
Glucose-Capillary: 240 mg/dL — ABNORMAL HIGH (ref 70–99)

## 2019-05-14 SURGERY — LUMBAR LAMINECTOMY/DECOMPRESSION MICRODISCECTOMY 3 LEVELS
Anesthesia: General | Site: Spine Lumbar

## 2019-05-14 MED ORDER — THROMBIN 5000 UNITS EX SOLR
OROMUCOSAL | Status: DC | PRN
  Administered 2019-05-14: 5 mL via TOPICAL

## 2019-05-14 MED ORDER — ONDANSETRON HCL 4 MG/2ML IJ SOLN
4.0000 mg | Freq: Four times a day (QID) | INTRAMUSCULAR | Status: DC | PRN
  Administered 2019-05-14: 4 mg via INTRAVENOUS
  Filled 2019-05-14: qty 2

## 2019-05-14 MED ORDER — CHLORHEXIDINE GLUCONATE CLOTH 2 % EX PADS: 6.0000 | MEDICATED_PAD | Freq: Once | CUTANEOUS | Status: DC

## 2019-05-14 MED ORDER — PROPOFOL 10 MG/ML IV BOLUS
INTRAVENOUS | Status: AC
  Filled 2019-05-14: qty 20

## 2019-05-14 MED ORDER — FENTANYL CITRATE (PF) 250 MCG/5ML IJ SOLN
INTRAMUSCULAR | Status: DC | PRN
  Administered 2019-05-14: 50 ug via INTRAVENOUS
  Administered 2019-05-14: 100 ug via INTRAVENOUS
  Administered 2019-05-14: 50 ug via INTRAVENOUS
  Administered 2019-05-14: 25 ug via INTRAVENOUS
  Administered 2019-05-14 (×3): 50 ug via INTRAVENOUS
  Administered 2019-05-14: 25 ug via INTRAVENOUS
  Administered 2019-05-14: 50 ug via INTRAVENOUS

## 2019-05-14 MED ORDER — FENTANYL CITRATE (PF) 100 MCG/2ML IJ SOLN
INTRAMUSCULAR | Status: AC
  Filled 2019-05-14: qty 2

## 2019-05-14 MED ORDER — DEXAMETHASONE SODIUM PHOSPHATE 10 MG/ML IJ SOLN
INTRAMUSCULAR | Status: DC | PRN
  Administered 2019-05-14: 10 mg via INTRAVENOUS

## 2019-05-14 MED ORDER — KETAMINE HCL 10 MG/ML IJ SOLN
INTRAMUSCULAR | Status: DC | PRN
  Administered 2019-05-14: 10 mg via INTRAVENOUS
  Administered 2019-05-14: 30 mg via INTRAVENOUS
  Administered 2019-05-14: 10 mg via INTRAVENOUS

## 2019-05-14 MED ORDER — GABAPENTIN 600 MG PO TABS
300.0000 mg | ORAL_TABLET | Freq: Three times a day (TID) | ORAL | Status: DC
  Filled 2019-05-14: qty 0.5

## 2019-05-14 MED ORDER — KETAMINE HCL 50 MG/5ML IJ SOSY
PREFILLED_SYRINGE | INTRAMUSCULAR | Status: AC
  Filled 2019-05-14: qty 5

## 2019-05-14 MED ORDER — ONDANSETRON HCL 4 MG/2ML IJ SOLN
INTRAMUSCULAR | Status: DC | PRN
  Administered 2019-05-14: 4 mg via INTRAVENOUS

## 2019-05-14 MED ORDER — DEXAMETHASONE SODIUM PHOSPHATE 10 MG/ML IJ SOLN
INTRAMUSCULAR | Status: AC
  Filled 2019-05-14: qty 1

## 2019-05-14 MED ORDER — ACETAMINOPHEN 650 MG RE SUPP: 650.0000 mg | RECTAL | Status: DC | PRN

## 2019-05-14 MED ORDER — 0.9 % SODIUM CHLORIDE (POUR BTL) OPTIME
TOPICAL | Status: DC | PRN
  Administered 2019-05-14: 1000 mL

## 2019-05-14 MED ORDER — FENTANYL CITRATE (PF) 100 MCG/2ML IJ SOLN
25.0000 ug | INTRAMUSCULAR | Status: DC | PRN
  Administered 2019-05-14 (×3): 50 ug via INTRAVENOUS

## 2019-05-14 MED ORDER — POTASSIUM CHLORIDE CRYS ER 20 MEQ PO TBCR
20.0000 meq | EXTENDED_RELEASE_TABLET | ORAL | Status: DC
  Administered 2019-05-15 – 2019-05-21 (×4): 20 meq via ORAL
  Filled 2019-05-14 (×5): qty 1

## 2019-05-14 MED ORDER — ROCURONIUM BROMIDE 10 MG/ML (PF) SYRINGE
PREFILLED_SYRINGE | INTRAVENOUS | Status: AC
  Filled 2019-05-14: qty 10

## 2019-05-14 MED ORDER — MORPHINE SULFATE (PF) 4 MG/ML IV SOLN
4.0000 mg | INTRAVENOUS | Status: DC | PRN
  Administered 2019-05-15 – 2019-05-17 (×9): 4 mg via INTRAVENOUS
  Filled 2019-05-14 (×10): qty 1

## 2019-05-14 MED ORDER — BACITRACIN ZINC 500 UNIT/GM EX OINT
TOPICAL_OINTMENT | CUTANEOUS | Status: DC | PRN
  Administered 2019-05-14: 1 via TOPICAL

## 2019-05-14 MED ORDER — GABAPENTIN 300 MG PO CAPS
300.0000 mg | ORAL_CAPSULE | Freq: Three times a day (TID) | ORAL | Status: DC
  Administered 2019-05-14 – 2019-05-21 (×21): 300 mg via ORAL
  Filled 2019-05-14 (×8): qty 1
  Filled 2019-05-14: qty 3
  Filled 2019-05-14 (×12): qty 1

## 2019-05-14 MED ORDER — ACETAMINOPHEN 325 MG PO TABS: 650.0000 mg | ORAL_TABLET | ORAL | Status: DC | PRN

## 2019-05-14 MED ORDER — LIDOCAINE 2% (20 MG/ML) 5 ML SYRINGE
INTRAMUSCULAR | Status: DC | PRN
  Administered 2019-05-14: 100 mg via INTRAVENOUS

## 2019-05-14 MED ORDER — LACTATED RINGERS IV SOLN
INTRAVENOUS | Status: DC
  Administered 2019-05-14: 12:00:00 via INTRAVENOUS

## 2019-05-14 MED ORDER — ACETAMINOPHEN 10 MG/ML IV SOLN
INTRAVENOUS | Status: AC
  Filled 2019-05-14: qty 100

## 2019-05-14 MED ORDER — FENTANYL CITRATE (PF) 250 MCG/5ML IJ SOLN
INTRAMUSCULAR | Status: AC
  Filled 2019-05-14: qty 5

## 2019-05-14 MED ORDER — METOPROLOL SUCCINATE ER 25 MG PO TB24
50.0000 mg | ORAL_TABLET | ORAL | Status: DC
  Administered 2019-05-15 – 2019-05-21 (×7): 50 mg via ORAL
  Filled 2019-05-14 (×8): qty 2

## 2019-05-14 MED ORDER — PHENOL 1.4 % MT LIQD: 1.0000 | OROMUCOSAL | Status: DC | PRN

## 2019-05-14 MED ORDER — ROCURONIUM BROMIDE 10 MG/ML (PF) SYRINGE
PREFILLED_SYRINGE | INTRAVENOUS | Status: DC | PRN
  Administered 2019-05-14: 50 mg via INTRAVENOUS
  Administered 2019-05-14: 20 mg via INTRAVENOUS
  Administered 2019-05-14: 10 mg via INTRAVENOUS
  Administered 2019-05-14: 50 mg via INTRAVENOUS
  Administered 2019-05-14: 20 mg via INTRAVENOUS

## 2019-05-14 MED ORDER — GLYCOPYRROLATE PF 0.2 MG/ML IJ SOSY
PREFILLED_SYRINGE | INTRAMUSCULAR | Status: AC
  Filled 2019-05-14: qty 1

## 2019-05-14 MED ORDER — DOCUSATE SODIUM 100 MG PO CAPS
100.0000 mg | ORAL_CAPSULE | Freq: Two times a day (BID) | ORAL | Status: DC
  Administered 2019-05-14 – 2019-05-21 (×14): 100 mg via ORAL
  Filled 2019-05-14 (×14): qty 1

## 2019-05-14 MED ORDER — SODIUM CHLORIDE 0.9 % IV SOLN
INTRAVENOUS | Status: DC | PRN
  Administered 2019-05-14: 30 ug/min via INTRAVENOUS

## 2019-05-14 MED ORDER — CYCLOBENZAPRINE HCL 10 MG PO TABS
ORAL_TABLET | ORAL | Status: AC
  Administered 2019-05-15: 10 mg via ORAL
  Filled 2019-05-14: qty 1

## 2019-05-14 MED ORDER — SODIUM CHLORIDE 0.9% FLUSH
3.0000 mL | INTRAVENOUS | Status: DC | PRN
  Administered 2019-05-16: 12:00:00 3 mL via INTRAVENOUS
  Filled 2019-05-14: qty 3

## 2019-05-14 MED ORDER — MENTHOL 3 MG MT LOZG: 1.0000 | LOZENGE | OROMUCOSAL | Status: DC | PRN

## 2019-05-14 MED ORDER — ONDANSETRON HCL 4 MG/2ML IJ SOLN
INTRAMUSCULAR | Status: AC
  Filled 2019-05-14: qty 2

## 2019-05-14 MED ORDER — LABETALOL HCL 5 MG/ML IV SOLN
INTRAVENOUS | Status: DC | PRN
  Administered 2019-05-14: 10 mg via INTRAVENOUS
  Administered 2019-05-14 (×3): 5 mg via INTRAVENOUS
  Administered 2019-05-14: 10 mg via INTRAVENOUS
  Administered 2019-05-14: 5 mg via INTRAVENOUS

## 2019-05-14 MED ORDER — SUGAMMADEX SODIUM 200 MG/2ML IV SOLN
INTRAVENOUS | Status: DC | PRN
  Administered 2019-05-14: 300 mg via INTRAVENOUS

## 2019-05-14 MED ORDER — SODIUM CHLORIDE 0.9% FLUSH
3.0000 mL | Freq: Two times a day (BID) | INTRAVENOUS | Status: DC
  Administered 2019-05-14 – 2019-05-20 (×11): 3 mL via INTRAVENOUS

## 2019-05-14 MED ORDER — THROMBIN 20000 UNITS EX SOLR
CUTANEOUS | Status: DC | PRN
  Administered 2019-05-14: 4 mL via TOPICAL

## 2019-05-14 MED ORDER — FUROSEMIDE 40 MG PO TABS
40.0000 mg | ORAL_TABLET | Freq: Every day | ORAL | Status: DC
  Administered 2019-05-15 – 2019-05-21 (×7): 40 mg via ORAL
  Filled 2019-05-14 (×7): qty 1

## 2019-05-14 MED ORDER — THROMBIN 5000 UNITS EX SOLR
CUTANEOUS | Status: AC
  Filled 2019-05-14: qty 5000

## 2019-05-14 MED ORDER — ACETAMINOPHEN 500 MG PO TABS
1000.0000 mg | ORAL_TABLET | Freq: Four times a day (QID) | ORAL | Status: AC
  Administered 2019-05-15 (×4): 1000 mg via ORAL
  Filled 2019-05-14 (×4): qty 2

## 2019-05-14 MED ORDER — HYDROMORPHONE HCL 1 MG/ML IJ SOLN
INTRAMUSCULAR | Status: AC
  Filled 2019-05-14: qty 1

## 2019-05-14 MED ORDER — PROPOFOL 10 MG/ML IV BOLUS
INTRAVENOUS | Status: DC | PRN
  Administered 2019-05-14: 200 mg via INTRAVENOUS

## 2019-05-14 MED ORDER — MIDAZOLAM HCL 2 MG/2ML IJ SOLN
INTRAMUSCULAR | Status: AC
  Filled 2019-05-14: qty 2

## 2019-05-14 MED ORDER — OXYCODONE HCL 5 MG PO TABS
5.0000 mg | ORAL_TABLET | ORAL | Status: DC | PRN
  Administered 2019-05-15 (×2): 5 mg via ORAL
  Filled 2019-05-14 (×2): qty 1

## 2019-05-14 MED ORDER — HYDROMORPHONE HCL 1 MG/ML IJ SOLN
1.0000 mg | Freq: Once | INTRAMUSCULAR | Status: AC
  Administered 2019-05-14: 1 mg via INTRAVENOUS

## 2019-05-14 MED ORDER — OXYCODONE HCL 5 MG PO TABS
10.0000 mg | ORAL_TABLET | ORAL | Status: DC | PRN
  Administered 2019-05-14 – 2019-05-21 (×36): 10 mg via ORAL
  Filled 2019-05-14 (×39): qty 2

## 2019-05-14 MED ORDER — THROMBIN 20000 UNITS EX SOLR
CUTANEOUS | Status: AC
  Filled 2019-05-14: qty 20000

## 2019-05-14 MED ORDER — BUPIVACAINE-EPINEPHRINE (PF) 0.5% -1:200000 IJ SOLN
INTRAMUSCULAR | Status: AC
  Filled 2019-05-14: qty 30

## 2019-05-14 MED ORDER — ONDANSETRON HCL 4 MG PO TABS: 4.0000 mg | ORAL_TABLET | Freq: Four times a day (QID) | ORAL | Status: DC | PRN

## 2019-05-14 MED ORDER — CEFAZOLIN SODIUM-DEXTROSE 2-4 GM/100ML-% IV SOLN
2.0000 g | Freq: Three times a day (TID) | INTRAVENOUS | Status: AC
  Administered 2019-05-14 – 2019-05-15 (×2): 2 g via INTRAVENOUS
  Filled 2019-05-14 (×2): qty 100

## 2019-05-14 MED ORDER — DEXMEDETOMIDINE HCL IN NACL 200 MCG/50ML IV SOLN
INTRAVENOUS | Status: DC | PRN
  Administered 2019-05-14: 8 ug via INTRAVENOUS
  Administered 2019-05-14: 12 ug via INTRAVENOUS
  Administered 2019-05-14: 8 ug via INTRAVENOUS

## 2019-05-14 MED ORDER — BUPIVACAINE LIPOSOME 1.3 % IJ SUSP
20.0000 mL | Freq: Once | INTRAMUSCULAR | Status: DC
  Filled 2019-05-14 (×2): qty 20

## 2019-05-14 MED ORDER — LABETALOL HCL 5 MG/ML IV SOLN
INTRAVENOUS | Status: AC
  Filled 2019-05-14: qty 4

## 2019-05-14 MED ORDER — ACETAMINOPHEN 10 MG/ML IV SOLN
INTRAVENOUS | Status: DC | PRN
  Administered 2019-05-14: 1000 mg via INTRAVENOUS

## 2019-05-14 MED ORDER — LIDOCAINE 2% (20 MG/ML) 5 ML SYRINGE
INTRAMUSCULAR | Status: AC
  Filled 2019-05-14: qty 5

## 2019-05-14 MED ORDER — SODIUM CHLORIDE 0.9 % IV SOLN
INTRAVENOUS | Status: DC | PRN
  Administered 2019-05-14: 500 mL

## 2019-05-14 MED ORDER — BISACODYL 10 MG RE SUPP: 10.0000 mg | Freq: Every day | RECTAL | Status: DC | PRN

## 2019-05-14 MED ORDER — DEXMEDETOMIDINE HCL IN NACL 80 MCG/20ML IV SOLN
INTRAVENOUS | Status: AC
  Filled 2019-05-14: qty 20

## 2019-05-14 MED ORDER — PROMETHAZINE HCL 25 MG/ML IJ SOLN: 6.2500 mg | INTRAMUSCULAR | Status: DC | PRN

## 2019-05-14 MED ORDER — HYDROMORPHONE HCL 1 MG/ML IJ SOLN
0.5000 mg | INTRAMUSCULAR | Status: AC | PRN
  Administered 2019-05-14 (×2): 0.5 mg via INTRAVENOUS

## 2019-05-14 MED ORDER — MIDAZOLAM HCL 5 MG/5ML IJ SOLN
INTRAMUSCULAR | Status: DC | PRN
  Administered 2019-05-14: 2 mg via INTRAVENOUS

## 2019-05-14 MED ORDER — BUPIVACAINE-EPINEPHRINE (PF) 0.5% -1:200000 IJ SOLN
INTRAMUSCULAR | Status: DC | PRN
  Administered 2019-05-14: 4 mL

## 2019-05-14 MED ORDER — CYCLOBENZAPRINE HCL 10 MG PO TABS
10.0000 mg | ORAL_TABLET | Freq: Three times a day (TID) | ORAL | Status: DC | PRN
  Administered 2019-05-14 – 2019-05-21 (×14): 10 mg via ORAL
  Filled 2019-05-14 (×14): qty 1

## 2019-05-14 MED ORDER — LACTATED RINGERS IV SOLN
INTRAVENOUS | Status: DC | PRN
  Administered 2019-05-14 (×2): via INTRAVENOUS

## 2019-05-14 MED ORDER — SODIUM CHLORIDE 0.9 % IV SOLN
250.0000 mL | INTRAVENOUS | Status: DC
  Administered 2019-05-15: 250 mL via INTRAVENOUS

## 2019-05-14 MED ORDER — SUCCINYLCHOLINE CHLORIDE 20 MG/ML IJ SOLN
INTRAMUSCULAR | Status: DC | PRN
  Administered 2019-05-14: 140 mg via INTRAVENOUS

## 2019-05-14 MED ORDER — BACITRACIN ZINC 500 UNIT/GM EX OINT
TOPICAL_OINTMENT | CUTANEOUS | Status: AC
  Filled 2019-05-14: qty 28.35

## 2019-05-14 SURGICAL SUPPLY — 70 items
ADH SKN CLS LQ APL DERMABOND (GAUZE/BANDAGES/DRESSINGS) ×3
APL SKNCLS STERI-STRIP NONHPOA (GAUZE/BANDAGES/DRESSINGS) ×3
BAG DECANTER FOR FLEXI CONT (MISCELLANEOUS) ×3 IMPLANT
BENZOIN TINCTURE PRP APPL 2/3 (GAUZE/BANDAGES/DRESSINGS) ×7 IMPLANT
BLADE CLIPPER SURG (BLADE) IMPLANT
BUR MATCHSTICK NEURO 3.0 LAGG (BURR) ×3 IMPLANT
BUR PRECISION FLUTE 6.0 (BURR) ×3 IMPLANT
CANISTER SUCT 3000ML PPV (MISCELLANEOUS) ×3 IMPLANT
CARTRIDGE OIL MAESTRO DRILL (MISCELLANEOUS) ×1 IMPLANT
CLOSURE STERI-STRIP 1/4X4 (GAUZE/BANDAGES/DRESSINGS) ×3 IMPLANT
CLOSURE WOUND 1/2 X4 (GAUZE/BANDAGES/DRESSINGS) ×3
COVER WAND RF STERILE (DRAPES) IMPLANT
DERMABOND ADHESIVE PROPEN (GAUZE/BANDAGES/DRESSINGS) ×6
DERMABOND ADVANCED .7 DNX6 (GAUZE/BANDAGES/DRESSINGS) IMPLANT
DIFFUSER DRILL AIR PNEUMATIC (MISCELLANEOUS) ×3 IMPLANT
DRAPE C-ARM 42X72 X-RAY (DRAPES) ×6 IMPLANT
DRAPE LAPAROTOMY 100X72X124 (DRAPES) ×3 IMPLANT
DRAPE MICROSCOPE LEICA (MISCELLANEOUS) IMPLANT
DRAPE POUCH INSTRU U-SHP 10X18 (DRAPES) ×3 IMPLANT
DRAPE SURG 17X23 STRL (DRAPES) ×14 IMPLANT
DRSG OPSITE POSTOP 4X6 (GAUZE/BANDAGES/DRESSINGS) ×6 IMPLANT
ELECT BLADE 4.0 EZ CLEAN MEGAD (MISCELLANEOUS) ×3
ELECT REM PT RETURN 9FT ADLT (ELECTROSURGICAL) ×3
ELECTRODE BLDE 4.0 EZ CLN MEGD (MISCELLANEOUS) ×1 IMPLANT
ELECTRODE REM PT RTRN 9FT ADLT (ELECTROSURGICAL) ×1 IMPLANT
GAUZE 4X4 16PLY RFD (DISPOSABLE) IMPLANT
GAUZE SPONGE 4X4 12PLY STRL (GAUZE/BANDAGES/DRESSINGS) IMPLANT
GLOVE BIO SURGEON STRL SZ 6.5 (GLOVE) ×6 IMPLANT
GLOVE BIO SURGEON STRL SZ7 (GLOVE) ×3 IMPLANT
GLOVE BIO SURGEON STRL SZ8 (GLOVE) ×3 IMPLANT
GLOVE BIO SURGEON STRL SZ8.5 (GLOVE) ×3 IMPLANT
GLOVE BIO SURGEONS STRL SZ 6.5 (GLOVE) ×3
GLOVE BIOGEL M 7.0 STRL (GLOVE) ×2 IMPLANT
GLOVE BIOGEL PI IND STRL 6.5 (GLOVE) ×3 IMPLANT
GLOVE BIOGEL PI IND STRL 7.0 (GLOVE) ×1 IMPLANT
GLOVE BIOGEL PI IND STRL 7.5 (GLOVE) IMPLANT
GLOVE BIOGEL PI INDICATOR 6.5 (GLOVE) ×6
GLOVE BIOGEL PI INDICATOR 7.0 (GLOVE) ×2
GLOVE BIOGEL PI INDICATOR 7.5 (GLOVE) ×2
GLOVE EXAM NITRILE XL STR (GLOVE) IMPLANT
GLOVE SURG SS PI 7.0 STRL IVOR (GLOVE) ×9 IMPLANT
GOWN STRL REUS W/ TWL LRG LVL3 (GOWN DISPOSABLE) ×3 IMPLANT
GOWN STRL REUS W/ TWL XL LVL3 (GOWN DISPOSABLE) ×1 IMPLANT
GOWN STRL REUS W/TWL 2XL LVL3 (GOWN DISPOSABLE) IMPLANT
GOWN STRL REUS W/TWL LRG LVL3 (GOWN DISPOSABLE) ×9
GOWN STRL REUS W/TWL XL LVL3 (GOWN DISPOSABLE) ×3
GRAFT DURAGEN MATRIX 1WX1L (Tissue) ×3 IMPLANT
KIT BASIN OR (CUSTOM PROCEDURE TRAY) ×3 IMPLANT
KIT TURNOVER KIT B (KITS) ×3 IMPLANT
NEEDLE HYPO 21X1.5 SAFETY (NEEDLE) ×3 IMPLANT
NEEDLE HYPO 22GX1.5 SAFETY (NEEDLE) ×3 IMPLANT
NS IRRIG 1000ML POUR BTL (IV SOLUTION) ×3 IMPLANT
OIL CARTRIDGE MAESTRO DRILL (MISCELLANEOUS) ×3
PACK LAMINECTOMY NEURO (CUSTOM PROCEDURE TRAY) ×3 IMPLANT
PAD ARMBOARD 7.5X6 YLW CONV (MISCELLANEOUS) ×13 IMPLANT
PATTIES SURGICAL .5 X.5 (GAUZE/BANDAGES/DRESSINGS) ×2 IMPLANT
PATTIES SURGICAL .5 X1 (DISPOSABLE) IMPLANT
PATTIES SURGICAL 1X1 (DISPOSABLE) IMPLANT
RUBBERBAND STERILE (MISCELLANEOUS) ×6 IMPLANT
SEALANT ADHERUS EXTEND TIP (MISCELLANEOUS) ×3 IMPLANT
SPONGE LAP 4X18 RFD (DISPOSABLE) ×3 IMPLANT
SPONGE SURGIFOAM ABS GEL 100 (HEMOSTASIS) ×3 IMPLANT
SPONGE SURGIFOAM ABS GEL SZ50 (HEMOSTASIS) ×3 IMPLANT
STRIP CLOSURE SKIN 1/2X4 (GAUZE/BANDAGES/DRESSINGS) ×6 IMPLANT
SUT VIC AB 1 CT1 18XBRD ANBCTR (SUTURE) ×2 IMPLANT
SUT VIC AB 1 CT1 8-18 (SUTURE) ×9
SUT VIC AB 2-0 CP2 18 (SUTURE) ×9 IMPLANT
TOWEL GREEN STERILE (TOWEL DISPOSABLE) ×3 IMPLANT
TOWEL GREEN STERILE FF (TOWEL DISPOSABLE) ×3 IMPLANT
WATER STERILE IRR 1000ML POUR (IV SOLUTION) ×3 IMPLANT

## 2019-05-14 NOTE — Op Note (Signed)
Brief history: The patient is a 53 year old black obese male who has a history of cervical and thoracic myelopathy.  He is a previous patient of Dr. Luiz Ochoa who performed a corpectomy years ago.  He has developed Quadraparesis.  He was worked up with local, thoracic and lumbar MRIs which demonstrated severe cervical stenosis.  I performed a corpectomy and anterior fusion on him a few months ago.  We discussed his thoracic and lumbar stenosis.  We discussed the various treatment options including surgery.  He has weighed the risks, benefits and alternatives of surgery and decided proceed with a thoracic and lumbar laminectomy.  Preoperative diagnosis: Thoracic spinal stenosis, thoracic myelopathy, lumbar spinal stenosis, lumbar radiculopathy, neurogenic claudication, lumbago  Postoperative diagnosis: The same  Procedure: T5-6 laminectomy; T11-12 laminectomy through a separate incision; L3-4 laminectomy through a separate incision, i.e. 3 incisions.   Surgeon: Dr. Earle Gell  Asst.: Arnetha Massy nurse practitioner  Anesthesia: Gen. endotracheal  Estimated blood loss: 150 cc  Drains: None  Complications: None  Description of procedure: The patient was brought to the operating room by the anesthesia team. General endotracheal anesthesia was induced. The patient was turned to the prone position on the Wilson frame. The patient's lumbosacral region was then prepared with Betadine scrub and Betadine solution. Sterile drapes were applied.  I then injected the area to be incised with Marcaine with epinephrine solution. I then used a scalpel to make a linear midline incision over the T5-6 intervertebral disc space. I then used electrocautery to perform a bilateral subperiosteal dissection exposing the spinous process and lamina of 5 and T6. We we use intraoperative fluoroscopy to confirm our location. I then inserted the [facility] retractor for exposure.  It looks like the patient's previous  laminectomy had largely "grown back".  I used a Leksell rongeur to remove the spinous process of T5 and T6.  I used a high-speed drill to perform bilateral T5 and T6 laminotomies.  I used a Kerrison punch to complete the T5 and T6 laminectomy and remove the ligamentum flavum decompressing the thecal sac at T5-6. We then removed the retractor and reapproximated patient's thoracic fascia with interrupted 0 Vicryl suture.  We reapproximated the cutaneous tissue with a rapid 2-0 Vicryl suture.  We reapproximated the skin with Steri-Strips and benzoin.   I then repeated this procedure at T11-12.  I made a separate incision over the T11-12 interspace.  I used electrocautery to perform a bilateral subperiosteal dissection exposing the spinous process and lamina of T11 and T12.  We confirmed our location again with intraoperative fluoroscopy.  We inserted the [facility] retractor for exposure.  Again it looks like the lamina had grown back from his previous operations.  I used a Leksell rongeur to remove the T11 and T12 spinous process.  I used a high-speed drill to perform bilateral T11 and T12 laminotomies.  I completed the T11 and to 12 laminectomy with a Kerrison punch and removed the ligamentum flavum decompressing the thecal sac.  I did create a durotomy on the left.  Did not appear that it could be closed so I covered with Gelfoam and Adheris.  We then removed the retractor and reapproximated patient's thoracic fascia with interrupted 0 Vicryl suture.  We reapproximated the cutaneous tissue with a rapid 2-0 Vicryl suture.  We reapproximated the skin with Steri-Strips and benzoin.  We then made a third incision over the L3-4 interspace.  I used electrocautery to perform a bilateral subperiosteal dissection exposing the spinous process lamina  of L3 and L4.  We used intraoperative fluoroscopy to confirm our location.  We inserted the [facility] retractor for exposure.  Again it looks like the lamina had grown back  from the previous operation.  I used a Leksell rongeur to remove the spinous process of L3 and L4.  I then used a high-speed drill to create bilateral L3 and L4 laminotomies.  I completed the L3 and L4 laminectomy with a Kerrison punch removing the ligamentum flavum decompressing the thecal sac.  We performed foraminotomies about the bilateral L4 nerve roots.  We obtained hemostasis with Gelfoam and bipolar electrocautery. We then removed the retractor and reapproximated patient's thoracic fascia with interrupted 0 Vicryl suture.  We reapproximated the cutaneous tissue with a rapid 2-0 Vicryl suture.  We reapproximated the skin with Steri-Strips and benzoin.   The wounds were then coated with bacitracin ointment.  Sterile dressings were applied.  The drapes were removed. The patient was subsequently returned to the supine position where they were extubated by the anesthesia team. The patient was then transported to the postanesthesia care unit in stable condition. All sponge instrument and needle counts were reportedly correct at the end of this case.

## 2019-05-14 NOTE — Progress Notes (Signed)
  NEUROSURGERY PROGRESS NOTE   Received call from PACU regarding new lower abd pain, groin pain and bilateral LE anterior thigh pain not controlled with medication. Came by to evaluate the patient. He is s/p T5-6, T11-12 and L3-4 laminectomy. On exam he has chronic RLE weakness with right foot drop. LLE weakness proximally >distally appears more pain mediated. No significant change compared to preop by report with exception of pain mediated left hip flexion. CT T spine and L spine reviewed. No evidence of epidural hematoma. Will work on pain control.  Added gabapentin.  Ferne Reus, PA-C [facility] [employer]

## 2019-05-14 NOTE — Anesthesia Postprocedure Evaluation (Signed)
Anesthesia Post Note  Patient: Nicolas [name]  Procedure(s) Performed: LAMINECTOMY AND FORAMINOTOMY THORACIC FIVE- THORACIC SIX, THORACIC ELEVEN- THORACIC THORACIC, LUMBAR THREE- LUMBAR FOUR (N/A Spine Lumbar)     Patient location during evaluation: PACU Anesthesia Type: General Level of consciousness: awake and alert Pain management: pain level controlled Vital Signs Assessment: post-procedure vital signs reviewed and stable Respiratory status: spontaneous breathing, nonlabored ventilation, respiratory function stable and patient connected to nasal cannula oxygen Cardiovascular status: blood pressure returned to baseline and stable Postop Assessment: no apparent nausea or vomiting Anesthetic complications: no    Last Vitals:  Vitals:   05/14/19 1927 05/14/19 1942  BP: (!) 182/75 (!) 191/92  Pulse: 88 89  Resp: 10 16  Temp:    SpO2: 94% 96%    Last Pain:  Vitals:   05/14/19 1927  TempSrc:   PainSc: 7                  Javar Eshbach DAVID

## 2019-05-14 NOTE — Progress Notes (Signed)
Patient arrived in the unit at 2200 from PACU, alert and oriented x4, no s/s of distress, but c/o feeling moderate pain on pelvis and groin areas, will provide appr. treatments and monitor closely.

## 2019-05-14 NOTE — Progress Notes (Signed)
Patient in PACU post laminectomy and foraminotomy at three levels of the thoracic spine. Pt began reporting severe pain in the abdomen, bilateral lower extremities, and pelvis. The pain began in his abdomen and groin and moved to his right leg and then his left. On call for Dr. Arnoldo Morale was contacted and ordered STAT CT without contrast of the lumbar and thoracic spine. Pt transported to scanner and returned to PACU at 2010. PA at bedside to assess patient at 2012.

## 2019-05-14 NOTE — Telephone Encounter (Signed)

## 2019-05-14 NOTE — Transfer of Care (Signed)
Immediate Anesthesia Transfer of Care Note  Patient: Nicolas [name]  Procedure(s) Performed: LAMINECTOMY AND FORAMINOTOMY THORACIC FIVE- THORACIC SIX, THORACIC ELEVEN- THORACIC THORACIC, LUMBAR THREE- LUMBAR FOUR (N/A Spine Lumbar)  Patient Location: PACU  Anesthesia Type:General  Level of Consciousness: awake  Airway & Oxygen Therapy: Patient Spontanous Breathing and Patient connected to nasal cannula oxygen  Post-op Assessment: Report given to RN and Post -op Vital signs reviewed and stable  Post vital signs: Reviewed and stable  Last Vitals:  Vitals Value Taken Time  BP 193/84 05/14/19 1833  Temp    Pulse 89 05/14/19 1835  Resp 23 05/14/19 1835  SpO2 95 % 05/14/19 1835  Vitals shown include unvalidated device data.  Last Pain:  Vitals:   05/14/19 1154  TempSrc:   PainSc: 3       Patients Stated Pain Goal: 2 ([id number])  Complications: No apparent anesthesia complications

## 2019-05-14 NOTE — Progress Notes (Signed)
Subjective: The patient's is somnolent but arousable.  He is in no apparent distress.  Objective: Vital signs in last 24 hours: Temp:  [97.5 F (36.4 C)-98.1 F (36.7 C)] 97.5 F (36.4 C) (06/18 1827) Pulse Rate:  [79-94] 94 (06/18 1827) Resp:  [12-20] 12 (06/18 1827) BP: (165-210)/(88-105) 210/105 (06/18 1827) SpO2:  [96 %-97 %] 96 % (06/18 1827) Weight:  [139.6 kg] 139.6 kg (06/18 1154) Estimated body mass index is 44.15 kg/m as calculated from the following:   Height as of this encounter: 5\' 10"  (1.778 m).   Weight as of this encounter: 139.6 kg.   Intake/Output from previous day: No intake/output data recorded. Intake/Output this shift: Total I/O In: 1700 [I.V.:1700] Out: 800 [Urine:400; Blood:400]  Physical exam the patient is somnolent but arousable.  He is moving his lower extremities.  His right leg is weaker than the left, without change from preop.  Lab Results: No results for input(s): WBC, HGB, HCT, PLT in the last 72 hours. BMET No results for input(s): NA, K, CL, CO2, GLUCOSE, BUN, CREATININE, CALCIUM in the last 72 hours.  Studies/Results: No results found.  Assessment/Plan: The patient is doing well.  LOS: 0 days     Ophelia Charter 05/14/2019, 6:38 PM

## 2019-05-14 NOTE — Anesthesia Procedure Notes (Signed)
Procedure Name: Intubation Date/Time: 05/14/2019 1:57 PM Performed by: Gaylene Brooks, CRNA Pre-anesthesia Checklist: Patient identified, Emergency Drugs available, Suction available and Patient being monitored Patient Re-evaluated:Patient Re-evaluated prior to induction Oxygen Delivery Method: Circle System Utilized Preoxygenation: Pre-oxygenation with 100% oxygen Induction Type: IV induction Laryngoscope Size: Miller and 2 Grade View: Grade I Tube type: Oral Number of attempts: 1 Airway Equipment and Method: Stylet and Oral airway Placement Confirmation: ETT inserted through vocal cords under direct vision,  positive ETCO2 and breath sounds checked- equal and bilateral Secured at: 25 cm Tube secured with: Tape Dental Injury: Teeth and Oropharynx as per pre-operative assessment

## 2019-05-14 NOTE — H&P (Signed)
Subjective: The patient is a 53 year old black male who has a history of cervical spondylosis, myelopathy and quadriparesis.  He has had 2 cervical surgeries, and a previous thoracic laminectomy.  He initially has done well but has developed progressive lower extremity weakness, right greater than left.  He was worked up with a thoracic and lumbar MRI which demonstrated thoracic and lumbar spinal stenosis.  I discussed the various treatment options with him.  He has decided to proceed with surgery after weighing the risks, benefits and alternatives.  Past Medical History:  Diagnosis Date  . Arthritis   . Clotting disorder ([place])    pulmonary embolus, DVT  . Depression   . Diabetes mellitus without complication (HCC)    no meds, watching diet  . DVT (deep venous thrombosis) ([place])    Summer 2012  . Hypertension   . Neuromuscular disorder ([place])    spinal stenosis.- pt walks with a walker short distance  . Peripheral vascular disease ([place])   . PONV (postoperative nausea and vomiting)   . Pulmonary embolism ([place])    x 3 in 2005, 2008, 2010  . Sleep apnea    does not wear a c-pap  . Spinal stenosis     Past Surgical History:  Procedure Laterality Date  . ANTERIOR CERVICAL CORPECTOMY N/A 01/26/2019   Procedure: Cervical Four Corpectomy with Cervical Three-Four, Cervical Four-Five interbody fusion, prosthesis, explore old fusion, possible removal of old plate;  Surgeon: [name], MD;  Location: [facility];  Service: Neurosurgery;  Laterality: N/A;  anterior approach  . CHOLECYSTECTOMY N/A 05/24/2014   Procedure: LAPAROSCOPIC CHOLECYSTECTOMY;  Surgeon: Zenovia Jarred, MD;  Location: [facility];  Service: General;  Laterality: N/A;  . ERCP N/A 05/25/2014   Procedure: ENDOSCOPIC RETROGRADE CHOLANGIOPANCREATOGRAPHY (ERCP);  Surgeon: Beryle Beams, MD;  Location: [facility];  Service: Endoscopy;  Laterality: N/A;  . ERCP N/A 07/16/2014   Procedure: ENDOSCOPIC RETROGRADE CHOLANGIOPANCREATOGRAPHY (ERCP);   Surgeon: Beryle Beams, MD;  Location: [facility];  Service: Endoscopy;  Laterality: N/A;  . ivp filter  jan 2012  . SPINE SURGERY  11/30/11   Total 6 back surgeries  . SPINE SURGERY  06/6760   Dr. Cathren Laine in [place], [place]  . [place]     08/2003, cervial spine 2005, 2010 lower back, 2011 lower back, 2012 & 2013  . UPPER GASTROINTESTINAL ENDOSCOPY    . WISDOM TOOTH EXTRACTION     2 removed    Allergies  Allergen Reactions  . Ace Inhibitors Other (See Comments)    cough  . Lisinopril Other (See Comments)    cough    Social History   Tobacco Use  . Smoking status: Former Smoker    Packs/day: 1.50    Years: 10.00    Pack years: 15.00    Types: Cigarettes    Quit date: 04/26/2001    Years since quitting: 18.0  . Smokeless tobacco: Never Used  Substance Use Topics  . Alcohol use: Yes    Alcohol/week: 1.0 standard drinks    Types: 1 Standard drinks or equivalent per week    Comment: very little    Family History  Problem Relation Age of Onset  . Cancer Mother 60       Lung  . Diabetes Father   . Prostate cancer Father   . Hypertension Father   . Hypertension Brother   . Hypertension Brother   . Diabetes Brother   . Heart attack Neg Hx   . Stroke Neg  Hx   . Colon cancer Neg Hx   . Esophageal cancer Neg Hx   . Pancreatic cancer Neg Hx   . Rectal cancer Neg Hx   . Stomach cancer Neg Hx    Prior to Admission medications   Medication Sig Start Date End Date Taking? Authorizing Provider  enoxaparin (LOVENOX) 150 MG/ML injection Inject 1 mL (150 mg total) into the skin every 12 (twelve) hours. 05/07/19  Yes Burnell Blanks, MD  furosemide (LASIX) 40 MG tablet Take 1 tablet (40 mg total) by mouth daily. 05/30/18  Yes Burnell Blanks, MD  metoprolol succinate (TOPROL-XL) 50 MG 24 hr tablet TAKE 1 TABLET EVERY EVENING WITH OR IMMEDIATELY FOLLOWING A MEAL Patient taking differently: Take 50 mg by mouth every morning.  02/03/19  Yes Forrest Moron, MD   Multiple Vitamin (MULTIVITAMIN WITH MINERALS) TABS tablet Take 1 tablet by mouth daily. Reported on 11/09/2015   Yes [provider]  warfarin (COUMADIN) 10 MG tablet TAKE 1/2 TO 1 TABLET DAILY AS DIRECTED BY COUMADIN CLINIC Patient taking differently: Take 5-10 mg by mouth See admin instructions. Take 1/2 to 1 tablet daily as directed by coumadin clinic;  Sun, Tues, Thurs, 10 mg, and all other days is 5 mg 05/04/19  Yes Burnell Blanks, MD  potassium chloride SA (K-DUR,KLOR-CON) 20 MEQ tablet Take 1 tablet (20 mEq total) by mouth as needed (with lasix). Patient taking differently: Take 20 mEq by mouth every other day.  05/30/18   Burnell Blanks, MD     Review of Systems  Positive ROS: As above  All other systems have been reviewed and were otherwise negative with the exception of those mentioned in the HPI and as above.  Objective: Vital signs in last 24 hours: Temp:  [98.1 F (36.7 C)] 98.1 F (36.7 C) (06/18 1141) Pulse Rate:  [79] 79 (06/18 1141) Resp:  [20] 20 (06/18 1141) BP: (165)/(88) 165/88 (06/18 1141) SpO2:  [97 %] 97 % (06/18 1141) Weight:  [139.6 kg] 139.6 kg (06/18 1154) Estimated body mass index is 44.15 kg/m as calculated from the following:   Height as of this encounter: 5\' 10"  (1.778 m).   Weight as of this encounter: 139.6 kg.   General Appearance: Obese, wheelchair-bound Head: Normocephalic, without obvious abnormality, atraumatic Eyes: PERRL, conjunctiva/corneas clear, EOM's intact,    Ears: Normal  Throat: Normal  Neck: Supple, Back: The patient's cervical and thoracic incisions are well-healed. Lungs: Clear to auscultation bilaterally, respirations unlabored Heart: Regular rate and rhythm, no murmur, rub or gallop Abdomen: Soft, non-tender Extremities: Extremities normal, atraumatic, no cyanosis or edema Skin: unremarkable  NEUROLOGIC:   Mental status: alert and oriented,Motor Exam -the patient is quadriparetic.  His right  lower extremity is the weakest.  He has a right foot drop.  Sensory Exam - grossly normal Reflexes:  Coordination - grossly normal Gait -he is wheelchair-bound Balance -unsteady Cranial Nerves: I: smell Not tested  II: visual acuity  OS: Normal  OD: Normal   II: visual fields Full to confrontation  II: pupils Equal, round, reactive to light  III,VII: ptosis None  III,IV,VI: extraocular muscles  Full ROM  V: mastication Normal  V: facial light touch sensation  Normal  V,VII: corneal reflex  Present  VII: facial muscle function - upper  Normal  VII: facial muscle function - lower Normal  VIII: hearing Not tested  IX: soft palate elevation  Normal  IX,X: gag reflex Present  XI: trapezius strength  5/5  XI: sternocleidomastoid strength 5/5  XI: neck flexion strength  5/5  XII: tongue strength  Normal    Data Review Lab Results  Component Value Date   WBC 6.6 05/11/2019   HGB 14.4 05/11/2019   HCT 44.8 05/11/2019   MCV 93.5 05/11/2019   PLT 199 05/11/2019   Lab Results  Component Value Date   NA 138 05/11/2019   K 4.0 05/11/2019   CL 103 05/11/2019   CO2 29 05/11/2019   BUN 11 05/11/2019   CREATININE 0.74 05/11/2019   GLUCOSE 107 (H) 05/11/2019   Lab Results  Component Value Date   INR 1.0 05/14/2019    Assessment/Plan: Thoracic and lumbar spinal stenosis, Quadraparesis, lumbago, lumbar radiculopathy: I have discussed the situation with the patient.  I have reviewed his imaging studies with him and pointed out the abnormalities.  We have discussed the various treatment options including surgery.  I have described the surgical treatment option of a thoracic and lumbar laminectomy.  I have shown him surgical models.  I have given him a surgical pamphlet.  We have discussed the risks, benefits, alternatives, expected postoperative course, and likelihood of achieving our goals with surgery.  I have answered all his questions.  He has decided to proceed with  surgery.   Ophelia Charter 05/14/2019 1:19 PM

## 2019-05-15 LAB — URINALYSIS, ROUTINE W REFLEX MICROSCOPIC
Bilirubin Urine: NEGATIVE
Glucose, UA: 50 mg/dL — AB
Hgb urine dipstick: NEGATIVE
Ketones, ur: NEGATIVE mg/dL
Leukocytes,Ua: NEGATIVE
Nitrite: NEGATIVE
Protein, ur: NEGATIVE mg/dL
Specific Gravity, Urine: 1.012 (ref 1.005–1.030)
pH: 6 (ref 5.0–8.0)

## 2019-05-15 NOTE — Progress Notes (Signed)
Physical Therapy Treatment Patient Details Name: Nicolas [name] MRN: [id number] DOB: [birth year] Today's Date: 05/15/2019    History of Present Illness 53 yo male s/p T5-6 T 11-12 L3-4 laminectomy. He had a previous cervical fusion erlier this year and a lumbar surgery in 2004. PMH pulmopnary embolism, spinal stenosis, HTN, depression, arthritis DMII    PT Comments    Therapy unable to transfer paitinet with Sera plus. Patient also unable to scoot like he did to get to the chair. Patient did well with slid board but still required min a.    Follow Up Recommendations  CIR     Equipment Recommendations  None recommended by PT    Recommendations for Other Services Rehab consult     Precautions / Restrictions Precautions Precautions: Back Precaution Comments: revirewed BLT percautions  Restrictions Weight Bearing Restrictions: No    Mobility  Bed Mobility Overal bed mobility: Needs Assistance Bed Mobility: Sit to Supine     Supine to sit: Mod assist Sit to supine: Mod assist;+2 for physical assistance   General bed mobility comments: mod a to get patient back to bed. Perferomed proper log roll.   Transfers Overall transfer level: Needs assistance   Transfers: Sit to/from Stand;Lateral/Scoot Transfers Sit to Stand: Max assist;+2 physical assistance;+2 safety/equipment        Lateral/Scoot Transfers: Mod assist;+2 physical assistance;+2 safety/equipment General transfer comment: attmepted serra + but patient unable to stand up enough. Able to scoot with mod a and slide board back to bed   Ambulation/Gait                 Stairs             Wheelchair Mobility    Modified Rankin (Stroke Patients Only)       Balance Overall balance assessment: Needs assistance Sitting-balance support: No upper extremity supported;Feet supported Sitting balance-Leahy Scale: Fair Sitting balance - Comments: gauring while sitting at the edge of the bed    Standing  balance support: Bilateral upper extremity supported Standing balance-Leahy Scale: Poor Standing balance comment: max a to remain balanced                            Cognition Arousal/Alertness: Awake/alert Behavior During Therapy: WFL for tasks assessed/performed Overall Cognitive Status: Within Functional Limits for tasks assessed                                        Exercises      General Comments General comments (skin integrity, edema, etc.): x3 bandages and all dressed       Pertinent Vitals/Pain Pain Assessment: No/denies pain Faces Pain Scale: Hurts a little bit Pain Location: testicles Pain Descriptors / Indicators: Aching Pain Intervention(s): Monitored during session;Premedicated before session;Repositioned    Home Living Family/patient expects to be discharged to:: Private residence Living Arrangements: Children Available Help at Discharge: Family;Available PRN/intermittently Type of Home: House Home Access: Ramped entrance   Home Layout: One level Home Equipment: Walker - 2 wheels;Tub bench;Wheelchair - power;Hospital bed;Adaptive equipment Additional Comments: alone during the day    Prior Function Level of Independence: Needs assistance  Gait / Transfers Assistance Needed: only walks in home with RW, uses power chair in community ADL's / Homemaking Assistance Needed: pt does the cooking/washing dishes from power chair that lifts him up 4' Comments: Patient reports he  can walk 50-60' at a time    PT Goals (current goals can now be found in the care plan section) Acute Rehab PT Goals Patient Stated Goal: to get stronger PT Goal Formulation: With patient Time For Goal Achievement: 05/22/19 Potential to Achieve Goals: Good    Frequency    Min 5X/week      PT Plan Discharge plan needs to be updated    Co-evaluation   Reason for Co-Treatment: Complexity of the patient's impairments (multi-system involvement);Necessary  to address cognition/behavior during functional activity;For patient/therapist safety;To address functional/ADL transfers   OT goals addressed during session: ADL's and self-care;Proper use of Adaptive equipment and DME;Strengthening/ROM      AM-PAC PT "6 Clicks" Mobility   Outcome Measure  Help needed turning from your back to your side while in a flat bed without using bedrails?: A Lot Help needed moving from lying on your back to sitting on the side of a flat bed without using bedrails?: A Lot Help needed moving to and from a bed to a chair (including a wheelchair)?: Total Help needed standing up from a chair using your arms (e.g., wheelchair or bedside chair)?: Total Help needed to walk in hospital room?: Total Help needed climbing 3-5 steps with a railing? : Total 6 Click Score: 8    End of Session Equipment Utilized During Treatment: Gait belt Activity Tolerance: No increased pain(limited by weakness) Patient left: with call bell/phone within reach;in bed;with nursing/sitter in room Nurse Communication: Mobility status PT Visit Diagnosis: Other abnormalities of gait and mobility (R26.89);Muscle weakness (generalized) (M62.81);Pain Pain - Right/Left: (bilateral) Pain - part of body: (testicles)     Time: 1108(1108)-1120 PT Time Calculation (min) (ACUTE ONLY): 12 min  Charges:  $Therapeutic Activity: 8-22 mins                        Carney Living PT DPT  05/15/2019, 3:15 PM

## 2019-05-15 NOTE — Evaluation (Signed)
Occupational Therapy Evaluation Patient Details Name: Nicolas [name] MRN: [id number] DOB: March 16, 1966 Today's Date: 05/15/2019    History of Present Illness 53 yo male s/p T5-6 T 11-12 L3-4 laminectomy. He had a previous cervical fusion erlier this year and a lumbar surgery in 2004. PMH pulmopnary embolism, spinal stenosis, HTN, depression, arthritis DMII   Clinical Impression   Patient is s/p T5-6 T11-12 L3/4 laminectomy surgery resulting in functional limitations due to the deficits listed below (see OT problem list). Pt currently total+2 Max (A) for sit <>stand with multiple attempts. Pt unable to sustain static standing with R LE weakness noted. Pt with R LE blocked. Pt demonstrates sliding transfer to drop arm chair this session.  Patient will benefit from skilled OT acutely to increase independence and safety with ADLS to allow discharge CIR.     Follow Up Recommendations  CIR    Equipment Recommendations  3 in 1 bedside commode;Wheelchair (measurements OT);Wheelchair cushion (measurements OT)    Recommendations for Other Services Rehab consult     Precautions / Restrictions Precautions Precautions: Back Precaution Comments: revirewed BLT percautions  Restrictions Weight Bearing Restrictions: No      Mobility Bed Mobility Overal bed mobility: Needs Assistance Bed Mobility: Supine to Sit     Supine to sit: Mod assist     General bed mobility comments: Mod a to roll to his side and mod a to pull up to sitting. No signifcant increase in testicular pain. Mild syncope  Transfers Overall transfer level: Needs assistance   Transfers: Sit to/from Stand;Lateral/Scoot Transfers Sit to Stand: Max assist;+2 physical assistance;+2 safety/equipment        Lateral/Scoot Transfers: Mod assist;+2 physical assistance;+2 safety/equipment General transfer comment: Mod a for safety to scoot over with the drop arm down. Sit to stand 4x. On the last tral patient able to come to  standing position. On the other three trials he was unable. OT perfromed knee block on the right side.     Balance Overall balance assessment: Needs assistance Sitting-balance support: No upper extremity supported;Feet supported Sitting balance-Leahy Scale: Fair Sitting balance - Comments: gauring while sitting at the edge of the bed    Standing balance support: Bilateral upper extremity supported Standing balance-Leahy Scale: Poor Standing balance comment: max a to remain balanced                           ADL either performed or assessed with clinical judgement   ADL Overall ADL's : Needs assistance/impaired Eating/Feeding: Modified independent   Grooming: Modified independent   Upper Body Bathing: Moderate assistance   Lower Body Bathing: Total assistance   Upper Body Dressing : Moderate assistance   Lower Body Dressing: Total assistance                 General ADL Comments: pt limited to OOb to chair with need to slide transfer due to bil LE weakness R > L     Vision         Perception     Praxis      Pertinent Vitals/Pain Pain Assessment: Faces Faces Pain Scale: Hurts little more Pain Location: testicles Pain Descriptors / Indicators: Aching Pain Intervention(s): Monitored during session;Premedicated before session;Repositioned     Hand Dominance Right   Extremity/Trunk Assessment Upper Extremity Assessment Upper Extremity Assessment: Generalized weakness   Lower Extremity Assessment Lower Extremity Assessment: Generalized weakness RLE Deficits / Details: 2+/5 ankle DF , LAQ 3/5  LLE  Deficits / Details: 3+/5 knee extension   Cervical / Trunk Assessment Cervical / Trunk Assessment: Other exceptions Cervical / Trunk Exceptions: s/p surg   Communication Communication Communication: No difficulties   Cognition Arousal/Alertness: Awake/alert Behavior During Therapy: WFL for tasks assessed/performed Overall Cognitive Status: Within  Functional Limits for tasks assessed                                     General Comments  x3 bandages and all dressed     Exercises     Shoulder Instructions      Home Living Family/patient expects to be discharged to:: Private residence Living Arrangements: Children Available Help at Discharge: Family;Available PRN/intermittently Type of Home: House Home Access: Ramped entrance     Home Layout: One level     Bathroom Shower/Tub: Teacher, early years/pre: Standard     Home Equipment: Environmental consultant - 2 wheels;Tub bench;Wheelchair - power;Hospital bed;Adaptive equipment Adaptive Equipment: Reacher Additional Comments: alone during the day      Prior Functioning/Environment Level of Independence: Needs assistance  Gait / Transfers Assistance Needed: only walks in home with RW, uses power chair in community ADL's / Homemaking Assistance Needed: pt does the cooking/washing dishes from power chair that lifts him up 4'   Comments: Patient reports he can walk 50-60' at a time         OT Problem List: Decreased strength;Decreased range of motion;Decreased activity tolerance;Impaired balance (sitting and/or standing);Decreased safety awareness;Decreased knowledge of use of DME or AE;Decreased knowledge of precautions;Pain      OT Treatment/Interventions: Self-care/ADL training;Therapeutic exercise;Neuromuscular education;Energy conservation;DME and/or AE instruction;Manual therapy;Modalities;Therapeutic activities;Patient/family education;Balance training    OT Goals(Current goals can be found in the care plan section) Acute Rehab OT Goals Patient Stated Goal: to get stronger OT Goal Formulation: With patient Time For Goal Achievement: 05/29/19 Potential to Achieve Goals: Good  OT Frequency: Min 3X/week   Barriers to D/C:            Co-evaluation PT/OT/SLP Co-Evaluation/Treatment: Yes Reason for Co-Treatment: Complexity of the patient's impairments  (multi-system involvement);Necessary to address cognition/behavior during functional activity;For patient/therapist safety;To address functional/ADL transfers PT goals addressed during session: Mobility/safety with mobility;Balance;Proper use of DME;Strengthening/ROM OT goals addressed during session: ADL's and self-care;Proper use of Adaptive equipment and DME;Strengthening/ROM      AM-PAC OT "6 Clicks" Daily Activity     Outcome Measure Help from another person eating meals?: None Help from another person taking care of personal grooming?: None Help from another person toileting, which includes using toliet, bedpan, or urinal?: A Lot Help from another person bathing (including washing, rinsing, drying)?: A Lot Help from another person to put on and taking off regular upper body clothing?: A Lot Help from another person to put on and taking off regular lower body clothing?: Total 6 Click Score: 15   End of Session Equipment Utilized During Treatment: Gait belt;Rolling walker Nurse Communication: Mobility status;Precautions  Activity Tolerance: Patient tolerated treatment well Patient left: in chair;with call bell/phone within reach;with chair alarm set  OT Visit Diagnosis: Unsteadiness on feet (R26.81);Muscle weakness (generalized) (M62.81)                Time: 0973-5329 OT Time Calculation (min): 15 min Charges:  OT General Charges $OT Visit: 1 Visit OT Evaluation $OT Eval Moderate Complexity: 1 Mod   Jeri Modena, OTR/L  Acute Rehabilitation Services Pager: [phone] Office: [phone] .  Jeri Modena 05/15/2019, 2:10 PM

## 2019-05-15 NOTE — Evaluation (Signed)
Physical Therapy Evaluation Patient Details Name: Nicolas [name] MRN: [id number] DOB: [birth year] Today's Date: 05/15/2019   History of Present Illness  53 yo male s/p T5-6 T 11-12 L3-4 laminectomy. He had a previous cervical fusion erlier this year and a lumbar surgery in 2004. PMH pulmopnary embolism, spinal stenosis, HTN, depression, arthritis   Clinical Impression  The patient requires significant assist for transfers and mobility. His back pain is controled but he is having testicular pain. His right leg is weak and needs a block to be able to stand. He stood 4x w/ PT/OT and was only able to achieve standing on the last trial. He scooted to the chair with mod a. The patients son assists him with mobility at home but at this time he needs more assistance for basic transfers. He may benefit from more intensive rehab at [facility]. Acute therapy will continue to work with the patient.     Follow Up Recommendations CIR    Equipment Recommendations  None recommended by PT    Recommendations for Other Services Rehab consult     Precautions / Restrictions Precautions Precautions: Back Precaution Comments: revirewed BLT percautions  Restrictions Weight Bearing Restrictions: No      Mobility  Bed Mobility Overal bed mobility: Needs Assistance Bed Mobility: Supine to Sit     Supine to sit: Mod assist     General bed mobility comments: Mod a to roll to his side and mod a to pull up to sitting. No signifcant increase in testicular pain. Mild syncope  Transfers Overall transfer level: Needs assistance   Transfers: Sit to/from Stand;Lateral/Scoot Transfers Sit to Stand: Max assist;+2 physical assistance;+2 safety/equipment        Lateral/Scoot Transfers: Mod assist;+2 physical assistance;+2 safety/equipment General transfer comment: Mod a for safety to scoot over with the drop arm down. Sit to stand 4x. On the last tral patient able to come to standing position. On the other three  trials he was unable. OT perfromed knee block on the right side.   Ambulation/Gait                Stairs            Wheelchair Mobility    Modified Rankin (Stroke Patients Only)       Balance Overall balance assessment: Needs assistance Sitting-balance support: No upper extremity supported;Feet supported Sitting balance-Leahy Scale: Fair Sitting balance - Comments: gauring while sitting at the edge of the bed    Standing balance support: Bilateral upper extremity supported Standing balance-Leahy Scale: Poor Standing balance comment: max a to remain balanced                             Pertinent Vitals/Pain Pain Assessment: Faces Faces Pain Scale: Hurts little more Pain Location: testicles Pain Descriptors / Indicators: Aching Pain Intervention(s): Limited activity within patient's tolerance;Monitored during session;Patient requesting pain meds-RN notified    Home Living Family/patient expects to be discharged to:: Private residence Living Arrangements: Children Available Help at Discharge: Family;Available PRN/intermittently Type of Home: House Home Access: Ramped entrance     Home Layout: One level Home Equipment: Walker - 2 wheels;Tub bench;Wheelchair - power;Hospital bed;Adaptive equipment Additional Comments: alone during the day    Prior Function Level of Independence: Needs assistance   Gait / Transfers Assistance Needed: only walks in home with RW, uses power chair in community  ADL's / Homemaking Assistance Needed: pt does the cooking/washing dishes from power  chair that lifts him up 4'  Comments: Patient reports he can walk 50-60' at a time      Hand Dominance   Dominant Hand: Right    Extremity/Trunk Assessment   Upper Extremity Assessment Upper Extremity Assessment: Defer to OT evaluation    Lower Extremity Assessment Lower Extremity Assessment: RLE deficits/detail;LLE deficits/detail RLE Deficits / Details: 2+/5 ankle  DF , LAQ 3/5  LLE Deficits / Details: 3+/5 knee extension       Communication   Communication: No difficulties  Cognition Arousal/Alertness: Awake/alert Behavior During Therapy: WFL for tasks assessed/performed Overall Cognitive Status: Within Functional Limits for tasks assessed                                        General Comments General comments (skin integrity, edema, etc.): bandages in 3 spots in the back     Exercises     Assessment/Plan    PT Assessment Patient needs continued PT services  PT Problem List         PT Treatment Interventions DME instruction;Gait training;Functional mobility training;Therapeutic activities;Therapeutic exercise;Neuromuscular re-education;Wheelchair mobility training;Patient/family education;Balance training    PT Goals (Current goals can be found in the Care Plan section)  Acute Rehab PT Goals Patient Stated Goal: to get stronger PT Goal Formulation: With patient Time For Goal Achievement: 05/22/19 Potential to Achieve Goals: Good    Frequency Min 5X/week   Barriers to discharge        Co-evaluation PT/OT/SLP Co-Evaluation/Treatment: Yes Reason for Co-Treatment: Complexity of the patient's impairments (multi-system involvement);Necessary to address cognition/behavior during functional activity;For patient/therapist safety;To address functional/ADL transfers PT goals addressed during session: Mobility/safety with mobility;Balance;Proper use of DME;Strengthening/ROM OT goals addressed during session: ADL's and self-care;Proper use of Adaptive equipment and DME;Strengthening/ROM       AM-PAC PT "6 Clicks" Mobility  Outcome Measure Help needed turning from your back to your side while in a flat bed without using bedrails?: A Lot Help needed moving from lying on your back to sitting on the side of a flat bed without using bedrails?: A Lot Help needed moving to and from a bed to a chair (including a wheelchair)?:  Total Help needed standing up from a chair using your arms (e.g., wheelchair or bedside chair)?: Total Help needed to walk in hospital room?: Total Help needed climbing 3-5 steps with a railing? : Total 6 Click Score: 8    End of Session Equipment Utilized During Treatment: Gait belt Activity Tolerance: No increased pain(limited by weakness) Patient left: in chair;with call bell/phone within reach Nurse Communication: Mobility status PT Visit Diagnosis: Other abnormalities of gait and mobility (R26.89);Muscle weakness (generalized) (M62.81);Pain Pain - Right/Left: (bilateral) Pain - part of body: (testicles)    Time: 6962-9528 PT Time Calculation (min) (ACUTE ONLY): 24 min   Charges:   PT Evaluation $PT Eval Moderate Complexity: 1 Mod           Carney Living PT DPT  05/15/2019, 12:31 PM

## 2019-05-15 NOTE — Progress Notes (Signed)
Rehab Admissions Coordinator Note:  Patient was screened by Michel Santee, PT, DPT for appropriateness for an Inpatient Acute Rehab Consult.  At this time, we are recommending Inpatient Rehab consult. Please place IP Rehab MD Consult order.   Shann Medal, PT, DPT Admissions Coordinator [phone] 05/15/19  4:03 PM

## 2019-05-15 NOTE — Progress Notes (Signed)
Subjective: The patient is alert and pleasant.  He complains of a burning pain in his bladder/testicles.  He denies headache.  Objective: Vital signs in last 24 hours: Temp:  [97.5 F (36.4 C)-98.2 F (36.8 C)] 98.1 F (36.7 C) (06/19 0432) Pulse Rate:  [79-97] 97 (06/19 0432) Resp:  [10-27] 19 (06/19 0432) BP: (155-210)/(68-105) 158/68 (06/19 0432) SpO2:  [93 %-97 %] 93 % (06/19 0432) Weight:  [139.6 kg] 139.6 kg (06/18 1154) Estimated body mass index is 44.15 kg/m as calculated from the following:   Height as of this encounter: 5\' 10"  (1.778 m).   Weight as of this encounter: 139.6 kg.   Intake/Output from previous day: 06/18 0701 - 06/19 0700 In: 2285 [P.O.:480; I.V.:1705; IV Piggyback:100] Out: 800 [Urine:400; Blood:400] Intake/Output this shift: Total I/O In: 585 [P.O.:480; I.V.:5; IV Piggyback:100] Out: -   Physical exam the patient is alert and pleasant.  His strength is unchanged from his preoperative status.  He has chronic weakness in his right lower extremity.  He is moving all 4 extremities well.  Lab Results: No results for input(s): WBC, HGB, HCT, PLT in the last 72 hours. BMET No results for input(s): NA, K, CL, CO2, GLUCOSE, BUN, CREATININE, CALCIUM in the last 72 hours.  Studies/Results: Dg Thoracolumabar Spine  Result Date: 05/14/2019 CLINICAL DATA:  Intraoperative localization for decompressive laminectomy at T5-6, T11-12, and L3-4. EXAM: THORACOLUMBAR SPINE 1V COMPARISON:  Prior MRI from 01/14/2019 FINDINGS: Three spot PA views of the thoracolumbar spine demonstrate surgical markers at the T5-6, T11-12, and L3-4 levels. Examination used for surgical localization purposes. IMPRESSION: Intraoperative localization, as above.  She Electronically Signed   By: Jeannine Boga M.D.   On: 05/14/2019 20:03   Ct Thoracic Spine Wo Contrast  Result Date: 05/14/2019 CLINICAL DATA:  Thoracic laminectomy today. Severe postoperative pain. EXAM: CT THORACIC SPINE  WITHOUT CONTRAST TECHNIQUE: Multidetector CT images of the thoracic were obtained using the standard protocol without intravenous contrast. COMPARISON:  MRI thoracic spine 01/14/2019 FINDINGS: Alignment: Normal Vertebrae: No vertebral fracture or mass. Paraspinal and other soft tissues: Postop laminectomy at T4 and T5 with gas in the soft tissues as expected. Postop laminectomy T10-11 and T11-T12 with gas in the soft tissues as expected. No postoperative hematoma or fluid collection. Visualized lungs clear without infiltrate or effusion. Disc levels: C7-T1: Bilateral facet degeneration with mild foraminal narrowing bilaterally and mild spinal stenosis T1-2: Moderate foraminal stenosis bilaterally due to facet hypertrophy and vertebral spurring. Mild spinal stenosis T2-3: Severe facet degeneration and severe foraminal encroachment bilaterally. Spinal canal not significantly narrowed T3-4: Severe facet hypertrophy bilaterally. Severe foraminal encroachment on the right and mild foraminal encroachment on the left. T4-5: Posterior decompression. Severe foraminal encroachment on the right due to facet hypertrophy. Mild left foraminal stenosis. T5-6: Posterior decompression without significant spinal stenosis. Severe right foraminal stenosis and moderate left foraminal encroachment due to facet hypertrophy. T6-7: Severe facet degeneration. Complete obliteration of the right foramen and severe left foraminal stenosis. Moderate to severe spinal stenosis. T7-8: Severe facet hypertrophy. Severe right foraminal encroachment and moderate to severe left foraminal encroachment. Moderate spinal stenosis. T8-9: Chronic laminectomy with bony overgrowth of the facets. Severe foraminal encroachment bilaterally. Mild spinal stenosis. T9-10: Chronic laminectomy with bony overgrowth the posterior elements. Severe facet hypertrophy bilaterally with severe foraminal stenosis bilaterally T10-11: Recent laminectomy with gas in the soft  tissues. Severe facet hypertrophy causing severe foraminal encroachment bilaterally. T11-12: Recent laminectomy with adequate decompression of the canal. Severe facet hypertrophy causing severe  foraminal encroachment bilaterally. T12-L1: Severe facet degeneration bilaterally. Moderate foraminal stenosis bilaterally. IMPRESSION: 1. Postop laminectomy T4 and T5 with gas in the soft tissues as expected. No hematoma 2. Postop laminectomy T10-11 and T12-L1 with adequate decompression of the canal. No postoperative hematoma. 3. Severe diffuse facet degeneration throughout the thoracic spine with marked bony overgrowth of the facets well as diffuse disc disease causing severe foraminal encroachment at multiple levels throughout the thoracic spine Electronically Signed   By: Franchot Gallo M.D.   On: 05/14/2019 20:45   Ct Lumbar Spine Wo Contrast  Result Date: 05/14/2019 CLINICAL DATA:  Thoracic and lumbar laminectomy today. Severe postoperative pain EXAM: CT LUMBAR SPINE WITHOUT CONTRAST TECHNIQUE: Multidetector CT imaging of the lumbar spine was performed without intravenous contrast administration. Multiplanar CT image reconstructions were also generated. COMPARISON:  Lumbar radiographs 12/16/2018, MRI 12/01/2018 FINDINGS: Segmentation: Normal Alignment: Normal Vertebrae: Negative for fracture or mass. Paraspinal and other soft tissues: IVC filter in good position. Abdominal aorta normal. No paraspinous mass or fluid collection. Gas in soft tissues from laminectomy L2-3 and L3-4 today. Disc levels: L1-2: Marked facet hypertrophy bilaterally causing mild spinal stenosis. Moderate to severe foraminal stenosis bilaterally due to bony overgrowth L2-3: Postop laminectomy with adequate decompression of the spinal canal. Moderate foraminal stenosis bilaterally due to vertebral spurring and bony overgrowth of the facet joints bilaterally. L3-4: Decompressive laminectomy. Spinal canal adequately decompressed above the disc  space however at and below the disc space there is marked facet hypertrophy causing moderate spinal stenosis. Severe subarticular and foraminal stenosis bilaterally due to facet hypertrophy. L4-5: Remote laminectomy. Spinal canal adequate the decompressed. Severe subarticular stenosis bilaterally due to facet hypertrophy and disc bulging L5-S1: Severe facet hypertrophy bilaterally. Severe subarticular stenosis bilaterally. Severe foraminal stenosis bilaterally. IMPRESSION: 1. Lumbar laminectomy at L2-3 3 and L3-4 today. Gas in the soft tissues as expected. No fluid collection or hematoma. 2. Severe degenerative changes throughout the lumbar spine with prominent bony overgrowth of the facet joints diffusely causing extensive subarticular and foraminal stenosis throughout the lumbar spine. 3. Remote laminectomy L4-5. Electronically Signed   By: Franchot Gallo M.D.   On: 05/14/2019 20:34   Dg C-arm 1-60 Min  Result Date: 05/14/2019 CLINICAL DATA:  Intraoperative localization for decompressive laminectomy at T5-6, T11-12, and L3-4. EXAM: THORACOLUMBAR SPINE 1V COMPARISON:  Prior MRI from 01/14/2019 FINDINGS: Three spot PA views of the thoracolumbar spine demonstrate surgical markers at the T5-6, T11-12, and L3-4 levels. Examination used for surgical localization purposes. IMPRESSION: Intraoperative localization, as above.  She Electronically Signed   By: Jeannine Boga M.D.   On: 05/14/2019 20:03    Assessment/Plan: Postop day #1: We will mobilize him with PT and OT.  He may need rehab.  We will see how he mobilizes over the weekend.  Bladder discomfort: I assume this is coming from his Foley catheter which has been removed.  I will check a urinalysis and urine culture.  LOS: 1 day     Ophelia Charter 05/15/2019, 6:41 AM

## 2019-05-15 NOTE — Plan of Care (Signed)
  Problem: Education: Goal: Required Educational Video(s) Outcome: Progressing

## 2019-05-16 LAB — URINE CULTURE: Culture: NO GROWTH

## 2019-05-16 NOTE — Progress Notes (Signed)
Postop day 2.  Patient continues to experience feelings of pressure and dysesthesia into his groin bilaterally.  The symptoms are improved from yesterday but still are quite problematic.  He does feel that he has improved motor function in both lower extremities although he remains quite weak.  His back pain is well controlled.  He has been able to void.  He is afebrile.  His vital signs are stable.  He is awake and alert.  He is oriented and appropriate.  Motor examination of his lower extremities reveals 2-3/5 strength in his right lower extremity.  He has 4-/5 strength in his left lower extremity.  Patient with a mid thoracic incomplete sensory level.  Wound clean and dry.  Abdomen soft.  Status post multilevel thoracic decompressive surgery.  Continue supportive efforts.  Patient will likely benefit from inpatient rehabilitation.

## 2019-05-16 NOTE — Progress Notes (Signed)
Physical Therapy Treatment Patient Details Name: Nicolas [name] MRN: [id number] DOB: 1966/05/09 Today's Date: 05/16/2019    History of Present Illness 53 yo male s/p T5-6 T 11-12 L3-4 laminectomy. He had a previous cervical fusion erlier this year and a lumbar surgery in 2004. PMH pulmopnary embolism, spinal stenosis, HTN, depression, arthritis DMII    PT Comments    Pt eager to get up, and is making good progress towards his goals today. Pt limited in safe mobility by groin pain as well as LE weakness R>L and associated decreased balance in standing. Pt requires min-modA for bed mobility, and min-modAx2 for sit<>stand using the Denna Haggard. D/c plans remain appropriate at this time. PT will continue to follow acutely.    Follow Up Recommendations  CIR     Equipment Recommendations  None recommended by PT    Recommendations for Other Services Rehab consult     Precautions / Restrictions Precautions Precautions: Back Precaution Comments: revirewed BLT percautions  Restrictions Weight Bearing Restrictions: No    Mobility  Bed Mobility Overal bed mobility: Needs Assistance Bed Mobility: Rolling;Sidelying to Sit;Sit to Sidelying Rolling: Min assist Sidelying to sit: Mod assist     Sit to sidelying: Min assist General bed mobility comments: minA for rolling onto L side, modA for bringing trunk to upright in sidelying, min A for mangement of LE back into bed  Transfers Overall transfer level: Needs assistance     Sit to Stand: Mod assist;+2 physical assistance;Min assist         General transfer comment: modA progressing to min A for 5x sit<>stand in [place], able to tolerate standing for a maximum of 1 minute before needing to sit due to LE weakness,      Balance Overall balance assessment: Needs assistance Sitting-balance support: No upper extremity supported;Feet supported;Single extremity supported;Bilateral upper extremity supported Sitting balance-Leahy Scale:  Fair     Standing balance support: Bilateral upper extremity supported Standing balance-Leahy Scale: Poor Standing balance comment: min-modA for steadying in standing                            Cognition Arousal/Alertness: Awake/alert Behavior During Therapy: WFL for tasks assessed/performed Overall Cognitive Status: Within Functional Limits for tasks assessed                                        Exercises General Exercises - Lower Extremity Long Arc Quad: AROM;Both;5 reps;Seated Hip Flexion/Marching: AROM;Both;5 reps;Seated Toe Raises: AROM;Both;5 reps;Seated    General Comments        Pertinent Vitals/Pain Pain Assessment: Faces Faces Pain Scale: Hurts a little bit Pain Location: testicles Pain Descriptors / Indicators: Aching Pain Intervention(s): Limited activity within patient's tolerance;Monitored during session;Repositioned    Home Living                      Prior Function            PT Goals (current goals can now be found in the care plan section) Acute Rehab PT Goals Patient Stated Goal: to get stronger PT Goal Formulation: With patient Time For Goal Achievement: 05/22/19 Potential to Achieve Goals: Good Progress towards PT goals: Progressing toward goals    Frequency    Min 5X/week      PT Plan Discharge plan needs to be updated  AM-PAC PT "6 Clicks" Mobility   Outcome Measure  Help needed turning from your back to your side while in a flat bed without using bedrails?: A Lot Help needed moving from lying on your back to sitting on the side of a flat bed without using bedrails?: A Lot Help needed moving to and from a bed to a chair (including a wheelchair)?: Total Help needed standing up from a chair using your arms (e.g., wheelchair or bedside chair)?: Total Help needed to walk in hospital room?: Total Help needed climbing 3-5 steps with a railing? : Total 6 Click Score: 8    End of Session  Equipment Utilized During Treatment: Gait belt Activity Tolerance: No increased pain(limited by weakness) Patient left: with call bell/phone within reach;in bed;with nursing/sitter in room Nurse Communication: Mobility status PT Visit Diagnosis: Other abnormalities of gait and mobility (R26.89);Muscle weakness (generalized) (M62.81);Pain Pain - Right/Left: (bilateral) Pain - part of body: (testicles)     Time: 6004-5997 PT Time Calculation (min) (ACUTE ONLY): 23 min  Charges:  $Therapeutic Exercise: 8-22 mins $Therapeutic Activity: 8-22 mins                     Kristyna Bradstreet B. Migdalia Dk PT, DPT Acute Rehabilitation Services Pager [phone] Office [phone]    [place] 05/16/2019, 2:11 PM

## 2019-05-17 ENCOUNTER — Other Ambulatory Visit: Payer: Self-pay

## 2019-05-17 ENCOUNTER — Encounter (HOSPITAL_COMMUNITY): Payer: Self-pay | Admitting: Neurosurgery

## 2019-05-17 NOTE — Progress Notes (Signed)
No significant change.  Patient still with bilateral groin discomfort albeit somewhat better than the last 2 days.  Was able to stand with therapy.  Remains weak in both legs.  Minimal pain.  No wound issues.  He is afebrile.  Vitals are stable.  Motor examination with 2-3/5 right lower extremity strength and 4/5 left lower extremity strength.  Abdomen soft.  Chest clear.  Progressing reasonably well following multilevel thoracic decompressive surgery.  Continue efforts at mobilization and therapy.  Good candidate for inpatient rehabilitation.

## 2019-05-17 NOTE — Progress Notes (Signed)
Occupational Therapy Treatment Patient Details Name: Nicolas [name] MRN: [id number] DOB: 1966/03/01 Today's Date: 05/17/2019    History of present illness 53 yo male s/p T5-6 T 11-12 L3-4 laminectomy. He had a previous cervical fusion erlier this year and a lumbar surgery in 2004. PMH pulmopnary embolism, spinal stenosis, HTN, depression, arthritis DMII   OT comments  Pt progressing towards acute OT goals. Pt continues to be very motivated to work with therapy. Focus of session was lateral scoot transfer utilizing sliding board to simulate toilet transfer. Pt needed mod A +2 safety/physcial assist. Pt up in recliner at end of session. D/c recommendation remains appropriate.    Follow Up Recommendations  CIR    Equipment Recommendations  3 in 1 bedside commode;Wheelchair (measurements OT);Wheelchair cushion (measurements OT)    Recommendations for Other Services      Precautions / Restrictions Precautions Precautions: Back Restrictions Weight Bearing Restrictions: No       Mobility Bed Mobility Overal bed mobility: Needs Assistance Bed Mobility: Rolling;Sidelying to Sit Rolling: Min assist Sidelying to sit: Mod assist       General bed mobility comments: minA for rolling onto L side, modA for bringing trunk to upright in sidelying,.  Transfers Overall transfer level: Needs assistance Equipment used: Sliding board            Lateral/Scoot Transfers: Mod assist;+2 safety/equipment;+2 physical assistance General transfer comment: Knee block to RLE. Extra time and effort. Assist to boost hips.     Balance Overall balance assessment: Needs assistance Sitting-balance support: No upper extremity supported;Feet supported;Single extremity supported;Bilateral upper extremity supported Sitting balance-Leahy Scale: Fair                                     ADL either performed or assessed with clinical judgement   ADL Overall ADL's : Needs  assistance/impaired                         Toilet Transfer: Moderate assistance;Transfer board;RW;Requires drop arm Toilet Transfer Details (indicate cue type and reason): simulated with drop arm recliner           General ADL Comments: Sliding board transfer from EOB to drop arm recliner due to BLE (R>L) weakness. Assist to block RLE and facilitate boosting hips using pad. Extra time and effort.      Vision       Perception     Praxis      Cognition Arousal/Alertness: Awake/alert Behavior During Therapy: WFL for tasks assessed/performed Overall Cognitive Status: Within Functional Limits for tasks assessed                                          Exercises     Shoulder Instructions       General Comments      Pertinent Vitals/ Pain       Pain Assessment: Faces Faces Pain Scale: Hurts little more Pain Location: back Pain Descriptors / Indicators: Aching Pain Intervention(s): Limited activity within patient's tolerance;Monitored during session;Repositioned  Home Living                                          Prior Functioning/Environment  Frequency  Min 3X/week        Progress Toward Goals  OT Goals(current goals can now be found in the care plan section)  Progress towards OT goals: Progressing toward goals  Acute Rehab OT Goals Patient Stated Goal: to get stronger OT Goal Formulation: With patient Time For Goal Achievement: 05/29/19 Potential to Achieve Goals: Good ADL Goals Pt Will Perform Lower Body Dressing: with mod assist;with adaptive equipment;sit to/from stand Pt Will Transfer to Toilet: with min guard assist;with transfer board;regular height toilet Additional ADL Goal #1: pt will complete bed to chair transfer total +2 mod (A) as precursor to adls. Additional ADL Goal #2: Pt will complete bed mobility supervision level to eob static sitting as precursor to adls.  Plan  Discharge plan remains appropriate    Co-evaluation                 AM-PAC OT "6 Clicks" Daily Activity     Outcome Measure   Help from another person eating meals?: None Help from another person taking care of personal grooming?: None Help from another person toileting, which includes using toliet, bedpan, or urinal?: A Lot Help from another person bathing (including washing, rinsing, drying)?: A Lot Help from another person to put on and taking off regular upper body clothing?: A Lot Help from another person to put on and taking off regular lower body clothing?: Total 6 Click Score: 15    End of Session Equipment Utilized During Treatment: Other (comment)(sliding board)  OT Visit Diagnosis: Unsteadiness on feet (R26.81);Muscle weakness (generalized) (M62.81)   Activity Tolerance Patient tolerated treatment well   Patient Left in chair;with call bell/phone within reach   Nurse Communication Mobility status        Time: 4917-9150 OT Time Calculation (min): 22 min  Charges: OT General Charges $OT Visit: 1 Visit OT Treatments $Self Care/Home Management : 8-22 mins  Tyrone Schimke, OT Acute Rehabilitation Services Pager: [phone] Office: [phone]    [name] 05/17/2019, 4:14 PM

## 2019-05-17 NOTE — Plan of Care (Signed)
Progressing towards goals

## 2019-05-18 MED ORDER — POLYETHYLENE GLYCOL 3350 17 G PO PACK
17.0000 g | PACK | Freq: Every day | ORAL | Status: DC | PRN
  Administered 2019-05-18 – 2019-05-21 (×2): 17 g via ORAL
  Filled 2019-05-18: qty 1

## 2019-05-18 NOTE — Progress Notes (Signed)
Physical Therapy Treatment Patient Details Name: Nicolas [name] MRN: [id number] DOB: 1966/11/07 Today's Date: 05/18/2019    History of Present Illness 53 yo male s/p T5-6 T 11-12 L3-4 laminectomy. He had a previous cervical fusion erlier this year and a lumbar surgery in 2004. PMH pulmopnary embolism, spinal stenosis, HTN, depression, arthritis DMII    PT Comments    Patient is motivated to participate in therapy. He continues to be limited by pain. He was able to use the slide board to transfer to the chair with mod a. He was fatigued after. He was able to complete there-ex. Therapy reviewed quad sets, glut sets, and ankle pumps to perform while he is up in a chair or while he is lying in bed.   Follow Up Recommendations  CIR     Equipment Recommendations  None recommended by PT    Recommendations for Other Services       Precautions / Restrictions Precautions Precautions: Back Restrictions Weight Bearing Restrictions: No    Mobility  Bed Mobility Overal bed mobility: Needs Assistance Bed Mobility: Rolling;Sidelying to Sit Rolling: Min assist Sidelying to sit: Mod assist Supine to sit: Mod assist Sit to supine: Mod assist;+2 for physical assistance Sit to sidelying: Min assist General bed mobility comments: continues to require mod a to sit up at theedg of th bed. Increased pain with transfer. Once to the edge of the bed only supervision required   Transfers Overall transfer level: Needs assistance Equipment used: Sliding board Transfers: Sit to/from Stand;Lateral/Scoot Transfers          Lateral/Scoot Transfers: Mod assist;+2 safety/equipment;+2 physical assistance General transfer comment: able to use the slide board weith mod a fro strength and balance to get over. Patient was fatigued after the transfer.   Ambulation/Gait                 Stairs             Wheelchair Mobility    Modified Rankin (Stroke Patients Only)       Balance  Overall balance assessment: Needs assistance Sitting-balance support: No upper extremity supported;Feet supported;Single extremity supported;Bilateral upper extremity supported Sitting balance-Leahy Scale: Good Sitting balance - Comments: improved sitting balance today                                     Cognition Arousal/Alertness: Awake/alert Behavior During Therapy: WFL for tasks assessed/performed Overall Cognitive Status: Within Functional Limits for tasks assessed                                        Exercises General Exercises - Lower Extremity Ankle Circles/Pumps: 20 reps;Both Quad Sets: 15 reps;Both Gluteal Sets: 15 reps;Both Long Arc Quad: Other reps (comment)(2x7 each leg more difficulty with the right )    General Comments        Pertinent Vitals/Pain Pain Assessment: Faces Faces Pain Scale: Hurts little more Pain Location: back Pain Descriptors / Indicators: Aching Pain Intervention(s): Limited activity within patient's tolerance;Monitored during session    Home Living                      Prior Function            PT Goals (current goals can now be found in the care plan section)  Acute Rehab PT Goals Patient Stated Goal: to get stronger PT Goal Formulation: With patient Time For Goal Achievement: 05/22/19 Potential to Achieve Goals: Good Progress towards PT goals: Progressing toward goals    Frequency    Min 3X/week      PT Plan Current plan remains appropriate    Co-evaluation              AM-PAC PT "6 Clicks" Mobility   Outcome Measure  Help needed turning from your back to your side while in a flat bed without using bedrails?: A Lot Help needed moving from lying on your back to sitting on the side of a flat bed without using bedrails?: A Lot Help needed moving to and from a bed to a chair (including a wheelchair)?: Total Help needed standing up from a chair using your arms (e.g., wheelchair  or bedside chair)?: Total Help needed to walk in hospital room?: Total Help needed climbing 3-5 steps with a railing? : Total 6 Click Score: 8    End of Session Equipment Utilized During Treatment: Gait belt Activity Tolerance: Patient limited by pain Patient left: with call bell/phone within reach;in bed;with nursing/sitter in room Nurse Communication: Mobility status PT Visit Diagnosis: Other abnormalities of gait and mobility (R26.89);Muscle weakness (generalized) (M62.81);Pain Pain - part of body: (testicles )     Time: 5993-5701 PT Time Calculation (min) (ACUTE ONLY): 17 min  Charges:  $Therapeutic Activity: 8-22 mins                      Carney Living PT DPT  05/18/2019, 9:51 AM

## 2019-05-18 NOTE — Plan of Care (Signed)
  Problem: Activity: Goal: Risk for activity intolerance will decrease Outcome: Progressing   

## 2019-05-18 NOTE — Progress Notes (Signed)
Inpatient Rehab Admissions:  Inpatient Rehab Consult received.  I met with pt at the bedside for rehabilitation assessment and to discuss goals and expectations of an inpatient rehab admission. Pt would like to pursue CIR at this time. AC has confirmed DC support and has initiated insurance approval for possible admit.   Will follow up once there is a determination.   Jhonnie Garner, OTR/L  Rehab Admissions Coordinator  [phone] 05/18/2019 5:32 PM

## 2019-05-18 NOTE — Care Management Important Message (Signed)
Important Message  Patient Details  Name: Nicolas [name] MRN: [id number] Date of Birth: [birth year]   Medicare Important Message Given:  Yes     Orbie Pyo 05/18/2019, 2:16 PM

## 2019-05-18 NOTE — Progress Notes (Signed)
Subjective: The patient is alert and pleasant.  He is in no apparent distress.  He continues to have some lower abdominal/perineal pain but is much better.  He is interested in rehab.  Objective: Vital signs in last 24 hours: Temp:  [97.7 F (36.5 C)-98.7 F (37.1 C)] 98.1 F (36.7 C) (06/22 1213) Pulse Rate:  [83-93] 84 (06/22 1213) Resp:  [17-20] 18 (06/22 1213) BP: (108-155)/(31-79) 141/72 (06/22 1213) SpO2:  [96 %-100 %] 99 % (06/22 1213) Estimated body mass index is 44.15 kg/m as calculated from the following:   Height as of this encounter: 5\' 10"  (1.778 m).   Weight as of this encounter: 139.6 kg.   Intake/Output from previous day: 06/21 0701 - 06/22 0700 In: 722 [P.O.:722] Out: 1425 [Urine:1425] Intake/Output this shift: Total I/O In: 240 [P.O.:240] Out: 200 [Urine:200]  Physical exam the patient is alert and oriented x3.  His wounds are healing well.  He continues to be weak in his right greater than left lower extremity.  Lab Results: No results for input(s): WBC, HGB, HCT, PLT in the last 72 hours. BMET No results for input(s): NA, K, CL, CO2, GLUCOSE, BUN, CREATININE, CALCIUM in the last 72 hours.  Studies/Results: No results found.  Assessment/Plan: Postop day #4: Think the patient would benefit from inpatient rehab.  Arrangements are being made.  Coagulation: The patient has a history of DVTs.  We will need to restart his Coumadin.  I would like to wait until tomorrow to decrease the chance of postoperative epidural hematoma.  LOS: 4 days     Nicolas [name] 05/18/2019, 1:00 PM

## 2019-05-18 NOTE — PMR Pre-admission (Addendum)
PMR Admission Coordinator Pre-Admission Assessment  Patient: Nicolas [name] is an 53 y.o., male MRN: [id number] DOB: 1965-12-06 Height: _0  (177.8 cm) Weight: (!) 139.6 kg  Insurance Information HMO: yes    PPO:      PCP:      IPA:      80/20:      OTHER:  PRIMARY: [employer]      Policy#: [id number]      Subscriber: patient CM Name: Nicolas [name]      Phone#: [phone]     Fax#: [phone]  Pre-Cert#: [id number]      Employer:  Nicolas [name] provided by Nicolas [name] for admit to CIR on 05/21/19. Weekly updates are due to Nicolas [name] (f): [phone] (p): [phone] [id number] Benefits:  Phone #: online     Name: [email or web address]  Eff. Date: 11/26/18 still active     Deduct: $0      Out of Pocket Max: %5,400 ($1,928.89)      Life Max: NA CIR: $295/day co pay for days 1-6, $0/day co-pay for days 7+      SNF: $0/day co-pay for days 1-20, $178/day co-pay for days 21-100, limited to 100 days/cal yr Outpatient: visits limited by medical necessity     Co-Pay: $10-40/visit co-pay pending service Home Health: 100%      Co-Pay: 0% DME: 80%     Co-Pay: 20% Providers:  SECONDARY:       Policy#:       Subscriber:  CM Name:       Phone#:      Fax#:  Pre-Cert#:       Employer:  Benefits:  Phone #:      Name:  Eff. Date:      Deduct:       Out of Pocket Max:       Life Max:  CIR:       SNF:  Outpatient:      Co-Pay:  Home Health:       Co-Pay:  DME:      Co-Pay:   Medicaid Application Date:       Case Manager:  Disability Application Date:       Case Worker:   The "Data Collection Information Summary" for patients in Inpatient Rehabilitation Facilities with attached "Privacy Act [place] Records" was provided and verbally reviewed with: Patient  Emergency Contact Information Contact Information    Name Relation Home Work Mobile   Nicolas [name] Significant other [phone]  [phone]   [facility] Daughter [phone]     Nicolas [name] Father   [phone]       Current Medical History  Patient Admitting Diagnosis: thoracic myelopathy   History of Present Illness: Nicolas [name] is a 53 year old male history of hypertension, clotting disorder with history of PE/DVT on chronic Coumadin followed by [name] [facility] as well as cervical myelopathy with cervical disc surgery x2 and previous thoracic laminectomy.   Presented 05/14/2019 with progressive lower extremity weakness right greater than left.  X-rays and imaging demonstrated thoracic and lumbar spinal stenosis with myelopathy.  Underwent T5-6 laminectomy, T11-12 laminectomy through a separate incision, L3-4 laminectomy through a separate incision 05/14/2019 per Dr. Arnoldo Morale.  Hospital course pain management.  No back brace required.  Chronic Coumadin has been resumed postoperatively.  Therapy evaluations completed and patient is to be admitted for a comprehensive rehab program on 05/21/19.     Patient's medical record from [facility] has been reviewed by the rehabilitation admission  coordinator and physician.  Past Medical History  Past Medical History:  Diagnosis Date  . Arthritis   . Clotting disorder ([place])    pulmonary embolus, DVT  . Depression   . Diabetes mellitus without complication (HCC)    no meds, watching diet  . DVT (deep venous thrombosis) ([place])    Summer 2012  . Hypertension   . Neuromuscular disorder ([place])    spinal stenosis.- pt walks with a walker short [name]  . Peripheral vascular disease ([place])   . PONV (postoperative nausea and vomiting)   . Pulmonary embolism ([place])    x 3 in 2005, 2008, 2010  . Sleep apnea    does not wear a c-pap  . Spinal stenosis     Family History   family history includes Cancer (age of onset: 22) in his mother; Diabetes in his brother and father; Hypertension in his brother, brother, and father; Prostate cancer in his father.  Prior Rehab/Hospitalizations Has the patient had prior rehab or hospitalizations prior  to admission? Yes  Has the patient had major surgery during 100 days prior to admission? Yes   Current Medications  Current Facility-Administered Medications:  .  0.9 %  sodium chloride infusion, 250 mL, Intravenous, Continuous, [name] Pies, MD, Last Rate: 1 mL/hr at 05/15/19 0002, 250 mL at 05/15/19 0002 .  acetaminophen (TYLENOL) tablet 650 mg, 650 mg, Oral, Q4H PRN **OR** acetaminophen (TYLENOL) suppository 650 mg, 650 mg, Rectal, Q4H PRN, [name] Pies, MD .  bisacodyl (DULCOLAX) suppository 10 mg, 10 mg, Rectal, Daily PRN, [name] Pies, MD .  bupivacaine liposome (EXPAREL) 1.3 % injection 266 mg, 20 mL, Infiltration, Once, [name] Pies, MD .  cyclobenzaprine (FLEXERIL) tablet 10 mg, 10 mg, Oral, TID PRN, [name] Pies, MD, 10 mg at 05/21/19 4944 .  docusate sodium (COLACE) capsule 100 mg, 100 mg, Oral, BID, [name] Pies, MD, 100 mg at 05/21/19 9675 .  furosemide (LASIX) tablet 40 mg, 40 mg, Oral, Daily, [name] Pies, MD, 40 mg at 05/21/19 0814 .  gabapentin (NEURONTIN) capsule 300 mg, 300 mg, Oral, TID, [name] Pies, MD, 300 mg at 05/21/19 0813 .  menthol-cetylpyridinium (CEPACOL) lozenge 3 mg, 1 lozenge, Oral, PRN **OR** phenol (CHLORASEPTIC) mouth spray 1 spray, 1 spray, Mouth/Throat, PRN, [name] Pies, MD .  metoprolol succinate (TOPROL-XL) 24 hr tablet 50 mg, 50 mg, Oral, Enid Derry, MD, 50 mg at 05/21/19 0610 .  morphine 4 MG/ML injection 4 mg, 4 mg, Intravenous, Q2H PRN, [name] Pies, MD, 4 mg at 05/17/19 0424 .  ondansetron (ZOFRAN) tablet 4 mg, 4 mg, Oral, Q6H PRN **OR** ondansetron (ZOFRAN) injection 4 mg, 4 mg, Intravenous, Q6H PRN, [name] Pies, MD, 4 mg at 05/14/19 2252 .  oxyCODONE (Oxy IR/ROXICODONE) immediate release tablet 10 mg, 10 mg, Oral, Q3H PRN, [name] Pies, MD, 10 mg at 05/21/19 0814 .  oxyCODONE (Oxy IR/ROXICODONE) immediate release tablet 5 mg, 5 mg, Oral, Q3H PRN, [name] Pies, MD, 5 mg at 05/15/19  1617 .  polyethylene glycol (MIRALAX / GLYCOLAX) packet 17 g, 17 g, Oral, Daily PRN, [name] Pies, MD, 17 g at 05/21/19 0815 .  potassium chloride SA (K-DUR) CR tablet 20 mEq, 20 mEq, Oral, Manya Silvas, MD, 20 mEq at 05/21/19 0814 .  sodium chloride flush (NS) 0.9 % injection 3 mL, 3 mL, Intravenous, Q12H, [name] Pies, MD, 3 mL at 05/20/19 2134 .  sodium chloride flush (NS) 0.9 % injection 3 mL, 3 mL, Intravenous, PRN, [name] Pies, MD, 3 mL at 05/16/19  1130 .  sodium phosphate (FLEET) 7-19 GM/118ML enema 1 enema, 1 enema, Rectal, Daily PRN, [name] Pies, MD, 1 enema at 05/21/19 [phone] .  warfarin (COUMADIN) tablet 5 mg, 5 mg, Oral, ONCE-1800, Bertis Ruddy, RPH .  Warfarin - Pharmacist Dosing Inpatient, , Does not apply, q1800, Dang, Thuy D, RPH  Patients Current Diet:  Diet Order            Diet - low sodium heart healthy        Diet - low sodium heart healthy        Diet regular Room service appropriate? Yes; Fluid consistency: Thin  Diet effective now              Precautions / Restrictions Precautions Precautions: Back, Fall Precaution Comments: revirewed BLT percautions  Restrictions Weight Bearing Restrictions: No   Has the patient had 2 or more falls or a fall with injury in the past year? No  Prior Activity Level Limited Community (1-2x/wk): not working or driving PTA; pt did use RW for short [name] for exercise and a powerchair for mobilization for most activities. Pt did get out of the house daily per his report  Prior Functional Level Self Care: Did the patient need help bathing, dressing, using the toilet or eating? Independent  Indoor Mobility: Did the patient need assistance with walking from room to room (with or without device)? Independent  Stairs: Did the patient need assistance with internal or external stairs (with or without device)? Unknown  Functional Cognition: Did the patient need help planning regular tasks such as  shopping or remembering to take medications? [place] / Equipment Home Assistive Devices/Equipment: Wheelchair, Environmental consultant (specify type), Cane (specify quad or straight), Blood pressure cuff, Tub transfer bench Home Equipment: Walker - 2 wheels, Tub bench, Wheelchair - power, Hospital bed, Adaptive equipment  Prior Device Use: Indicate devices/aids used by the patient prior to current illness, exacerbation or injury? Motorized wheelchair or scooter and Barista  Overall Cognitive Status: Within Functional Limits for tasks assessed Orientation Level: Oriented X4    Extremity Assessment (includes Sensation/Coordination)  Upper Extremity Assessment: Generalized weakness  Lower Extremity Assessment: Defer to PT evaluation RLE Deficits / Details: 2+/5 ankle DF , LAQ 3/5  LLE Deficits / Details: 3+/5 knee extension    ADLs  Overall ADL's : Needs assistance/impaired Eating/Feeding: Modified independent Grooming: Modified independent Upper Body Bathing: Minimal assistance Lower Body Bathing: Maximal assistance, Bed level Upper Body Dressing : Minimal assistance Lower Body Dressing: +2 for physical assistance, Sit to/from stand Toilet Transfer: Moderate assistance, Transfer board, RW, Requires drop arm Toilet Transfer Details (indicate cue type and reason): simulated with drop arm recliner General ADL Comments: Sliding board transfer from EOB to drop arm recliner due to BLE (R>L) weakness. Assist to block RLE and facilitate boosting hips using pad. Extra time and effort.     Mobility  Overal bed mobility: Needs Assistance Bed Mobility: Rolling, Sidelying to Sit Rolling: Min assist Sidelying to sit: Mod assist Supine to sit: Mod assist Sit to supine: Mod assist, +2 for physical assistance Sit to sidelying: Min assist General bed mobility comments: Mod a to edge of bed today. Increased assist required to sit up.     Transfers   Overall transfer level: Needs assistance Equipment used: Sliding board Transfer via Lift Equipment: Stedy Transfers: Sit to/from Stand Sit to Stand: +2 physical assistance, Max assist  Lateral/Scoot Transfers: Max assist General transfer comment: had to  stop the first slide board transfer and re-adjust. Max a on the second trial to assist with strength and get him back into the chair. Did not perfrom sit to stands from the chair because of +1 help and a lower surface.     Ambulation / Gait / Stairs / Office manager / Balance Dynamic Sitting Balance Sitting balance - Comments: improved sitting balance today  Balance Overall balance assessment: Needs assistance Sitting-balance support: No upper extremity supported, Feet supported, Single extremity supported, Bilateral upper extremity supported Sitting balance-Leahy Scale: Good Sitting balance - Comments: improved sitting balance today  Standing balance support: Bilateral upper extremity supported Standing balance-Leahy Scale: Poor Standing balance comment: min-modA for steadying in standing    Special needs/care consideration BiPAP/CPAP : no CPM : no Continuous Drip IV : no Dialysis : no        Days : no Life Vest : no Oxygen : no Special Bed no Trach Size : no Wound Vac (area) : no      Location : no Skin : surgical incision to back                          Bowel mgmt: continent, last BM 05/14/19 Bladder mgmt: continent Diabetic mgmt: yes Behavioral consideration : no Chemo/radiation : no   Previous Home Environment (from acute therapy documentation) Living Arrangements: Children Available Help at Discharge: Family, Available PRN/intermittently Type of Home: House Home Layout: One level Home Access: Ramped entrance Bathroom Shower/Tub: Chiropodist: Standard Home Care Services: No Additional Comments: alone during the day  Discharge Living Setting Plans for Discharge Living  Setting: Patient's home, Lives with (comment)(son (63 yo)) Type of Home at Discharge: House Discharge Home Layout: One level Discharge Home Access: [place] entrance Discharge Bathroom Shower/Tub: Tub/shower unit(tub transfer bench) Discharge Bathroom Toilet: Standard Discharge Bathroom Accessibility: Yes How Accessible: Accessible via walker Does the patient have any problems obtaining your medications?: No  Social/Family/Support Systems Patient Roles: Other (Comment)(parent to 55 yo ) Contact Information: son: Brandan Robicheaux ([phone]) Anticipated Caregiver: son Anticipated Caregiver's Contact Information: see above Ability/Limitations of Caregiver: Min A Caregiver Availability: Other (Comment)(son works nights (available daytime); daughter close by) Discharge Plan Discussed with Primary Caregiver: Yes Is Caregiver In Agreement with Plan?: Yes Does Caregiver/Family have Issues with Lodging/Transportation while Pt is in Rehab?: No  Goals/Additional Needs Patient/Family Goal for Rehab: PT/OT: Min A; SLP: NA Expected length of stay: 16-20 days Cultural Considerations: NA Dietary Needs: regular diet, thin liquids Equipment Needs: TBD Pt/Family Agrees to Admission and willing to participate: Yes Program Orientation Provided & Reviewed with Pt/Caregiver Including Roles  & Responsibilities: Yes(pt and son)  Barriers to Discharge: Home environment access/layout  Barriers to Discharge Comments: bathroom not accessible to powerchair or WC; son works at nighttime   Decrease burden of Care through IP rehab admission: NA  Possible need for SNF placement upon discharge: Not anticipated; pt has good social support at DC and has good prognosis for further progress through CIR.   Patient Condition: I have reviewed medical records from [facility], spoken with pt, and son. I met with patient at the bedside for inpatient rehabilitation assessment.  Patient will benefit from ongoing PT  and OT, can actively participate in 3 hours of therapy a day 5 days of the week, and can make measurable gains during the admission.  Patient will also benefit from the coordinated team  approach during an Inpatient Acute Rehabilitation admission.  The patient will receive intensive therapy as well as Rehabilitation physician, nursing, social worker, and care management interventions.  Due to bladder management, bowel management, safety, skin/wound care, disease management, medication administration, pain management and patient education the patient requires 24 hour a day rehabilitation nursing.  The patient is currently Mod A for lateral scoot transfers mobility and mod/Total A for basic ADLs.  Discharge setting and therapy post discharge at home with home health is anticipated.  Patient has agreed to participate in the Acute Inpatient Rehabilitation Program and will admit to CIR.Marland Kitchen  Preadmission Screen Completed By:  Jhonnie Garner, 05/21/2019 12:20 PM ______________________________________________________________________   Discussed status with Dr. Letta Pate on 05/21/19 at 12:19PM and received approval for admission today.  Admission Coordinator:  Jhonnie Garner, OT, time 12:19PM/Date 05/21/19.   Assessment/Plan: Diagnosis:Cervical myelopathy 1. Does the need for close, 24 hr/day Medical supervision in concert with the patient's rehab needs make it unreasonable for this patient to be served in a less intensive setting? Yes 2. Co-Morbidities requiring supervision/potential complications: Hypercoagulable state with hx DVT and PE on chronic coumadin, HTN 3. Due to bladder management, bowel management, safety, skin/wound care, disease management, medication administration, pain management and patient education, does the patient require 24 hr/day rehab nursing? Yes 4. Does the patient require coordinated care of a physician, rehab nurse, PT (1-2 hrs/day, 5 days/week) and OT (1-2 hrs/day, 5 days/week) to address  physical and functional deficits in the context of the above medical diagnosis(es)? Yes Addressing deficits in the following areas: balance, endurance, locomotion, strength, transferring, bowel/bladder control, bathing, dressing, feeding, grooming, toileting and psychosocial support 5. Can the patient actively participate in an intensive therapy program of at least 3 hrs of therapy 5 days a week? Yes 6. The potential for patient to make measurable gains while on inpatient rehab is good 7. Anticipated functional outcomes upon discharge from inpatients are: min assist PT, min assist OT, n/a SLP 8. Estimated rehab length of stay to reach the above functional goals is: 16-20d 9. Anticipated D/C setting: Home 10. Anticipated post D/C treatments: [place] therapy 11. Overall Rehab/Functional Prognosis: good  MD Signature: [name] M.D. [facility] FAAPM&R (Sports Med, Neuromuscular Med) Diplomate Am Board of Electrodiagnostic Med

## 2019-05-19 MED ORDER — MAGNESIUM CITRATE PO SOLN
1.0000 | Freq: Once | ORAL | Status: AC
  Administered 2019-05-19: 10:00:00 1 via ORAL
  Filled 2019-05-19: qty 296

## 2019-05-19 MED ORDER — WARFARIN - PHARMACIST DOSING INPATIENT
Freq: Every day | Status: DC
  Administered 2019-05-20: 16:00:00

## 2019-05-19 MED ORDER — WARFARIN SODIUM 7.5 MG PO TABS
15.0000 mg | ORAL_TABLET | Freq: Once | ORAL | Status: AC
  Administered 2019-05-19: 17:00:00 15 mg via ORAL
  Filled 2019-05-19: qty 2

## 2019-05-19 NOTE — Progress Notes (Signed)
Subjective:  The patient is alert and pleasant.  He is concerned about his Coumadin and would like to resume it.  He is awaiting rehab placement.  He complains of constipation and requested  Magnesium citrate.  Objective: Vital signs in last 24 hours: Temp:  [97.7 F (36.5 C)-98.6 F (37 C)] 98 F (36.7 C) (06/23 0418) Pulse Rate:  [74-92] 74 (06/23 0605) Resp:  [17-18] 18 (06/23 0418) BP: (121-155)/(53-79) 138/72 (06/23 0605) SpO2:  [95 %-100 %] 95 % (06/23 0418) Estimated body mass index is 44.15 kg/m as calculated from the following:   Height as of this encounter: 5\' 10"  (1.778 m).   Weight as of this encounter: 139.6 kg.   Intake/Output from previous day: 06/22 0701 - 06/23 0700 In: 240 [P.O.:240] Out: 950 [Urine:950] Intake/Output this shift: No intake/output data recorded.  Physical exam   The patient is alert and pleasant.  His motor strength is unchanged.  He  Is weaker in his right leg and left.  Lab Results: No results for input(s): WBC, HGB, HCT, PLT in the last 72 hours. BMET No results for input(s): NA, K, CL, CO2, GLUCOSE, BUN, CREATININE, CALCIUM in the last 72 hours.  Studies/Results: No results found.  Assessment/Plan:   Postop day 5.: We will resume his Coumadin.  We are awaiting rehab placement.  LOS: 5 days     Ophelia Charter 05/19/2019, 7:57 AM

## 2019-05-19 NOTE — Plan of Care (Signed)
  Problem: Education: Goal: Required Educational Video(s) Outcome: Progressing   Problem: Clinical Measurements: Goal: Ability to maintain clinical measurements within normal limits will improve Outcome: Progressing Goal: Postoperative complications will be avoided or minimized Outcome: Progressing   Problem: Skin Integrity: Goal: Demonstration of wound healing without infection will improve Outcome: Progressing   Problem: Education: Goal: Knowledge of General Education information will improve Description: Including pain rating scale, medication(s)/side effects and non-pharmacologic comfort measures Outcome: Progressing   Problem: Health Behavior/Discharge Planning: Goal: Ability to manage health-related needs will improve Outcome: Progressing   Problem: Clinical Measurements: Goal: Ability to maintain clinical measurements within normal limits will improve Outcome: Progressing Goal: Will remain free from infection Outcome: Progressing Goal: Diagnostic test results will improve Outcome: Progressing Goal: Respiratory complications will improve Outcome: Progressing Goal: Cardiovascular complication will be avoided Outcome: Progressing   Problem: Activity: Goal: Risk for activity intolerance will decrease Outcome: Progressing   Problem: Nutrition: Goal: Adequate nutrition will be maintained Outcome: Progressing   Problem: Coping: Goal: Level of anxiety will decrease Outcome: Progressing   Problem: Elimination: Goal: Will not experience complications related to bowel motility Outcome: Progressing Goal: Will not experience complications related to urinary retention Outcome: Progressing   Problem: Pain Managment: Goal: General experience of comfort will improve Outcome: Progressing   Problem: Safety: Goal: Ability to remain free from injury will improve Outcome: Progressing   Problem: Skin Integrity: Goal: Risk for impaired skin integrity will  decrease Outcome: Progressing   Ival Bible, BSN, RN

## 2019-05-19 NOTE — Discharge Instructions (Signed)

## 2019-05-19 NOTE — Progress Notes (Addendum)
Inpatient Rehabilitation-Admissions Coordinator   [facility] has received an initial denial for CIR request. AC has left voicemail with Dr. Arnoldo Morale to see if he would like to complete a peer to peer conference in an attempt to overturn the denial. [facility] will follow up once plan has been determined.   Please call if questions.   Jhonnie Garner, OTR/L  Rehab Admissions Coordinator  [phone] 05/19/2019 4:12 PM  ADDENDUM 5:36PM: PM&R MD, Dr Naaman Plummer, has agreed to complete peer to peer. Arranged peer to peer conference call. Will update once there is a determination. (Pt notified of initial denial and appreciative of chance to overturn decision).   Jhonnie Garner, OTR/L  Rehab Admissions Coordinator  [phone] 05/19/2019 5:37 PM

## 2019-05-19 NOTE — Progress Notes (Signed)
ANTICOAGULATION CONSULT NOTE - Initial Consult  Pharmacy Consult:  Coumadin Indication:  History of recurrent VTEs  Allergies  Allergen Reactions  . Ace Inhibitors Other (See Comments)    cough  . Lisinopril Other (See Comments)    cough    Patient Measurements: Height: 5\' 10"  (177.8 cm) Weight: (!) 307 lb 11.2 oz (139.6 kg) IBW/kg (Calculated) : 73  Vital Signs: Temp: 98.1 F (36.7 C) (06/23 0802) Temp Source: Oral (06/23 0802) BP: 145/77 (06/23 0802) Pulse Rate: 89 (06/23 0802)  Labs: No results for input(s): HGB, HCT, PLT, APTT, LABPROT, INR, HEPARINUNFRC, HEPRLOWMOCWT, CREATININE, CKTOTAL, CKMB, TROPONINI in the last 72 hours.  Estimated Creatinine Clearance: 152.2 mL/min (by C-G formula based on SCr of 0.74 mg/dL).   Medical History: Past Medical History:  Diagnosis Date  . Arthritis   . Clotting disorder ([place])    pulmonary embolus, DVT  . Depression   . Diabetes mellitus without complication (HCC)    no meds, watching diet  . DVT (deep venous thrombosis) ([place])    Summer 2012  . Hypertension   . Neuromuscular disorder ([place])    spinal stenosis.- pt walks with a walker short distance  . Peripheral vascular disease ([place])   . PONV (postoperative nausea and vomiting)   . Pulmonary embolism ([place])    x 3 in 2005, 2008, 2010  . Sleep apnea    does not wear a c-pap  . Spinal stenosis       Assessment: 58 YOM with progressive lower extremity weakness s/p laminectomies on 05/14/19.  Pharmacy consulted to resume Coumadin from PTA for history of recurrent VTEs.  Last INR sub-therapeutic at 1.  No bleeding reported.  Home Coumadin dose: 5mg  daily except 10mg  on Tues/Thurs/Sun  Goal of Therapy:  INR 2-3 Monitor platelets by anticoagulation protocol: Yes   Plan:   Coumadin 15mg  PO today Daily PT / INR MD to advise whether bridging is indicated Consider starting SSI for elevated CBGs  Elinora Weigand D. Mina Marble, PharmD, BCPS, [place] 05/19/2019, 8:22 AM

## 2019-05-19 NOTE — Plan of Care (Signed)
  Problem: Nutrition: Goal: Adequate nutrition will be maintained Outcome: Progressing   

## 2019-05-19 NOTE — Progress Notes (Signed)
Occupational Therapy Treatment Patient Details Name: Nicolas [name] MRN: [id number] DOB: Jun 11, 1966 Today's Date: 05/19/2019    History of present illness 53 yo male s/p T5-6 T 11-12 L3-4 laminectomy. He had a previous cervical fusion erlier this year and a lumbar surgery in 2004. PMH pulmopnary embolism, spinal stenosis, HTN, depression, arthritis DMII   OT comments  Treatment session with focus on sit > stand and increased standing balance/tolerance.  Pt completed bed mobility with min-mod assist to sit to EOB.  Engaged in sit > stand x3 with focus on anterior weight shifting and pushing up through BLE. +2 for sit > stand, blocking at Rt knee for stability when in standing. Pt tolerated standing upright for ~10 seconds with UE support on RW.  Discussed home setup and PLOF with pt reporting short distance ambulation with RW and having tub bench at home.  Pt will most likely require drop arm BSC for toileting needs at home.  Pt will continue to benefit from OT acutely to prepare for next venue of care.  Follow Up Recommendations  CIR    Equipment Recommendations  3 in 1 bedside commode;Wheelchair (measurements OT);Wheelchair cushion (measurements OT)       Precautions / Restrictions Precautions Precautions: Back;Fall       Mobility Bed Mobility Overal bed mobility: Needs Assistance Bed Mobility: Rolling;Sidelying to Sit Rolling: Min assist Sidelying to sit: Mod assist Supine to sit: Mod assist Sit to supine: Mod assist;+2 for physical assistance Sit to sidelying: Min assist    Transfers Overall transfer level: Needs assistance   Transfers: Sit to/from Stand Sit to Stand: Mod assist;+2 physical assistance                  ADL either performed or assessed with clinical judgement   ADL Overall ADL's : Needs assistance/impaired Eating/Feeding: Modified independent   Grooming: Modified independent   Upper Body Bathing: Minimal assistance   Lower Body Bathing:  Maximal assistance;Bed level   Upper Body Dressing : Minimal assistance   Lower Body Dressing: +2 for physical assistance;Sit to/from stand                                 Cognition Arousal/Alertness: Awake/alert Behavior During Therapy: WFL for tasks assessed/performed Overall Cognitive Status: Within Functional Limits for tasks assessed                                                     Pertinent Vitals/ Pain       Pain Assessment: Faces Faces Pain Scale: Hurts a little bit Pain Location: back Pain Descriptors / Indicators: Aching Pain Intervention(s): Limited activity within patient's tolerance;Monitored during session         Frequency  Min 3X/week        Progress Toward Goals  OT Goals(current goals can now be found in the care plan section)  Progress towards OT goals: Progressing toward goals  Acute Rehab OT Goals Patient Stated Goal: to get stronger OT Goal Formulation: With patient Time For Goal Achievement: 05/29/19 Potential to Achieve Goals: Good  Plan Discharge plan remains appropriate    Co-evaluation    PT/OT/SLP Co-Evaluation/Treatment: Yes Reason for Co-Treatment: Complexity of the patient's impairments (multi-system involvement);For patient/therapist safety;To address functional/ADL transfers   OT goals addressed  during session: ADL's and self-care;Strengthening/ROM      AM-PAC OT "6 Clicks" Daily Activity     Outcome Measure   Help from another person eating meals?: None Help from another person taking care of personal grooming?: None Help from another person toileting, which includes using toliet, bedpan, or urinal?: A Lot Help from another person bathing (including washing, rinsing, drying)?: A Lot Help from another person to put on and taking off regular upper body clothing?: A Lot Help from another person to put on and taking off regular lower body clothing?: Total 6 Click Score: 15    End of  Session Equipment Utilized During Treatment: Gait belt;Rolling walker  OT Visit Diagnosis: Unsteadiness on feet (R26.81);Muscle weakness (generalized) (M62.81)   Activity Tolerance Patient tolerated treatment well;No increased pain   Patient Left in bed;with call bell/phone within reach;with bed alarm set   Nurse Communication Mobility status        Time: 4315-4008 OT Time Calculation (min): 23 min  Charges: OT General Charges $OT Visit: 1 Visit OT Treatments $Self Care/Home Management : 8-22 mins    Simonne Come, [phone] 05/19/2019, 1:30 PM

## 2019-05-19 NOTE — Progress Notes (Signed)
Physical Therapy Treatment Patient Details Name: Nicolas [name] MRN: [id number] DOB: 05/25/1966 Today's Date: 05/19/2019    History of Present Illness 53 yo male s/p T5-6 T 11-12 L3-4 laminectomy. He had a previous cervical fusion erlier this year and a lumbar surgery in 2004. PMH pulmopnary embolism, spinal stenosis, HTN, depression, arthritis DMII    PT Comments    Patient did well today. He was able to stand 4x with therapy. Each trial he was able to stand for a little less. He required cuing to breath. He had mild syncope after the third trial. He was not moved to a chair today per nursing request. He is highly motivated and will be a great candidate for CIR.    Follow Up Recommendations  CIR     Equipment Recommendations  None recommended by PT    Recommendations for Other Services Rehab consult     Precautions / Restrictions Precautions Precautions: Back;Fall    Mobility  Bed Mobility Overal bed mobility: Needs Assistance Bed Mobility: Rolling;Sidelying to Sit Rolling: Min assist Sidelying to sit: Mod assist Supine to sit: Min assist Sit to supine: Mod assist;+2 for physical assistance Sit to sidelying: Min assist General bed mobility comments: improved ability to get in and out of bed   Transfers Overall transfer level: Needs assistance Equipment used: Sliding board Transfers: Sit to/from Stand Sit to Stand: +2 physical assistance;Max assist         General transfer comment: Sit to stand 4x with PT/OT. OT perfromed a right knee block   Ambulation/Gait                 Stairs             Wheelchair Mobility    Modified Rankin (Stroke Patients Only)       Balance Overall balance assessment: Needs assistance Sitting-balance support: No upper extremity supported;Feet supported;Single extremity supported;Bilateral upper extremity supported Sitting balance-Leahy Scale: Good Sitting balance - Comments: improved sitting balance today                                     Cognition Arousal/Alertness: Awake/alert Behavior During Therapy: WFL for tasks assessed/performed Overall Cognitive Status: Within Functional Limits for tasks assessed                                        Exercises Other Exercises Other Exercises: leg press 2x10  Other Exercises: hip abduction with resistance x15  Other Exercises: hip adduction with resitance x20 Other Exercises: heel slidex10     General Comments        Pertinent Vitals/Pain Pain Assessment: Faces Faces Pain Scale: Hurts a little bit Pain Location: back Pain Descriptors / Indicators: Aching Pain Intervention(s): Limited activity within patient's tolerance    Home Living                      Prior Function            PT Goals (current goals can now be found in the care plan section) Acute Rehab PT Goals Patient Stated Goal: to get stronger PT Goal Formulation: With patient Time For Goal Achievement: 05/22/19 Potential to Achieve Goals: Good Progress towards PT goals: Progressing toward goals    Frequency    Min 3X/week  PT Plan Current plan remains appropriate    Co-evaluation PT/OT/SLP Co-Evaluation/Treatment: Yes Reason for Co-Treatment: Complexity of the patient's impairments (multi-system involvement);Necessary to address cognition/behavior during functional activity;For patient/therapist safety;To address functional/ADL transfers PT goals addressed during session: Mobility/safety with mobility;Balance;Proper use of DME;Strengthening/ROM OT goals addressed during session: ADL's and self-care;Strengthening/ROM      AM-PAC PT "6 Clicks" Mobility   Outcome Measure  Help needed turning from your back to your side while in a flat bed without using bedrails?: A Lot Help needed moving from lying on your back to sitting on the side of a flat bed without using bedrails?: A Lot Help needed moving to and from a bed to a  chair (including a wheelchair)?: Total Help needed standing up from a chair using your arms (e.g., wheelchair or bedside chair)?: Total Help needed to walk in hospital room?: Total Help needed climbing 3-5 steps with a railing? : Total 6 Click Score: 8    End of Session Equipment Utilized During Treatment: Gait belt Activity Tolerance: Patient limited by pain Patient left: with call bell/phone within reach;in bed;with nursing/sitter in room Nurse Communication: Mobility status PT Visit Diagnosis: Other abnormalities of gait and mobility (R26.89);Muscle weakness (generalized) (M62.81);Pain Pain - Right/Left: Right Pain - part of body: Shoulder     Time: 6967-8938 PT Time Calculation (min) (ACUTE ONLY): 23 min  Charges:  $Therapeutic Activity: 8-22 mins                        Carney Living  PT DPT  05/19/2019, 2:22 PM

## 2019-05-20 ENCOUNTER — Other Ambulatory Visit: Payer: Self-pay | Admitting: Cardiovascular Disease

## 2019-05-20 LAB — PROTIME-INR
INR: 1.1 (ref 0.8–1.2)
Prothrombin Time: 14.4 seconds (ref 11.4–15.2)

## 2019-05-20 MED ORDER — WARFARIN SODIUM 5 MG PO TABS
10.0000 mg | ORAL_TABLET | Freq: Once | ORAL | Status: DC
  Administered 2019-05-20: 16:00:00 10 mg via ORAL
  Filled 2019-05-20: qty 2

## 2019-05-20 NOTE — Progress Notes (Signed)
ANTICOAGULATION CONSULT NOTE   Pharmacy Consult:  Coumadin Indication:  History of recurrent VTEs  Allergies  Allergen Reactions  . Ace Inhibitors Other (See Comments)    cough  . Lisinopril Other (See Comments)    cough    Patient Measurements: Height: 5\' 10"  (177.8 cm) Weight: (!) 307 lb 11.2 oz (139.6 kg) IBW/kg (Calculated) : 73  Vital Signs: Temp: 97.8 F (36.6 C) (06/24 0757) Temp Source: Oral (06/24 0757) BP: 147/81 (06/24 0757) Pulse Rate: 105 (06/24 0757)  Labs: Recent Labs    05/20/19 0513  LABPROT 14.4  INR 1.1    Estimated Creatinine Clearance: 152.2 mL/min (by C-G formula based on SCr of 0.74 mg/dL).   Medical History: Past Medical History:  Diagnosis Date  . Arthritis   . Clotting disorder ([place])    pulmonary embolus, DVT  . Depression   . Diabetes mellitus without complication ([facility])    no meds, watching diet  . DVT (deep venous thrombosis) ([place])    Summer 2012  . Hypertension   . Neuromuscular disorder ([place])    spinal stenosis.- pt walks with a walker short distance  . Peripheral vascular disease ([place])   . PONV (postoperative nausea and vomiting)   . Pulmonary embolism ([place])    x 3 in 2005, 2008, 2010  . Sleep apnea    does not wear a c-pap  . Spinal stenosis     Assessment: 74 YOM with progressive lower extremity weakness s/p laminectomies on 05/14/19.  Pharmacy consulted to resume Coumadin from PTA for history of recurrent VTEs.  Last INR sub-therapeutic at 1.   INR today came back subtherapeutic at 1.1. Warfarin restarted 6/23. No bleeding reported.   Home Coumadin dose: 5mg  daily except 10mg  on Tues/Thurs/Sun  Goal of Therapy:  INR 2-3 Monitor platelets by anticoagulation protocol: Yes   Plan:   Coumadin 10 mg PO today  Daily PT / INR MD to advise whether bridging is indicated Consider starting SSI for elevated CBGs  Antonietta Jewel, PharmD, BCCCP Clinical Pharmacist  Pager: [phone] Phone: [phone] 05/20/2019, 9:15 AM

## 2019-05-20 NOTE — Progress Notes (Signed)
Physical Therapy Treatment Patient Details Name: Nicolas [name] MRN: [id number] DOB: 09/05/1966 Today's Date: 05/20/2019    History of Present Illness 53 yo male s/p T5-6 T 11-12 L3-4 laminectomy. He had a previous cervical fusion erlier this year and a lumbar surgery in 2004. PMH pulmopnary embolism, spinal stenosis, HTN, depression, arthritis DMII    PT Comments    Patient required a little more help to the chair with the sideboard today.  He has been in the bed since before yesterday 2nd to taking a laxative. He was motivated to get out of the bed. He tolerated exercises well. He remains a great candidate form CIR.   Follow Up Recommendations  CIR     Equipment Recommendations  None recommended by PT    Recommendations for Other Services Rehab consult     Precautions / Restrictions Precautions Precautions: Back;Fall Restrictions Weight Bearing Restrictions: No    Mobility  Bed Mobility Overal bed mobility: Needs Assistance Bed Mobility: Rolling;Sidelying to Sit     Supine to sit: Mod assist Sit to supine: Mod assist;+2 for physical assistance   General bed mobility comments: Mod a to edge of bed today. Increased assist required to sit up.   Transfers Overall transfer level: Needs assistance Equipment used: Sliding board Transfers: Sit to/from Stand          Lateral/Scoot Transfers: Max assist General transfer comment: had to stop the first slide board transfer and re-adjust. Max a on the second trial to assist with strength and get him back into the chair. Did not perfrom sit to stands from the chair because of +1 help and a lower surface.   Ambulation/Gait                 Stairs             Wheelchair Mobility    Modified Rankin (Stroke Patients Only)       Balance Overall balance assessment: Needs assistance Sitting-balance support: No upper extremity supported;Feet supported;Single extremity supported;Bilateral upper extremity  supported Sitting balance-Leahy Scale: Good Sitting balance - Comments: improved sitting balance today                                     Cognition Arousal/Alertness: Awake/alert Behavior During Therapy: WFL for tasks assessed/performed Overall Cognitive Status: Within Functional Limits for tasks assessed                                        Exercises General Exercises - Lower Extremity Quad Sets: 15 reps Gluteal Sets: 15 reps Other Exercises Other Exercises: leg press 2x10  Other Exercises: hip abduction with resistance x15  Other Exercises: hip adduction with resitance x20 Other Exercises: heel slidex10     General Comments        Pertinent Vitals/Pain Pain Assessment: 0-10 Pain Score: 6  Pain Location: back Pain Descriptors / Indicators: Aching Pain Intervention(s): Limited activity within patient's tolerance;Monitored during session;Repositioned    Home Living                      Prior Function            PT Goals (current goals can now be found in the care plan section) Acute Rehab PT Goals Patient Stated Goal: to get stronger PT Goal  Formulation: With patient Time For Goal Achievement: 05/22/19 Potential to Achieve Goals: Good Progress towards PT goals: Progressing toward goals    Frequency    Min 3X/week      PT Plan Current plan remains appropriate    Co-evaluation              AM-PAC PT "6 Clicks" Mobility   Outcome Measure  Help needed turning from your back to your side while in a flat bed without using bedrails?: A Lot Help needed moving from lying on your back to sitting on the side of a flat bed without using bedrails?: A Lot Help needed moving to and from a bed to a chair (including a wheelchair)?: Total Help needed standing up from a chair using your arms (e.g., wheelchair or bedside chair)?: Total Help needed to walk in hospital room?: Total Help needed climbing 3-5 steps with a  railing? : Total 6 Click Score: 8    End of Session Equipment Utilized During Treatment: Gait belt Activity Tolerance: Patient limited by pain Patient left: with call bell/phone within reach;in bed;with nursing/sitter in room Nurse Communication: Mobility status PT Visit Diagnosis: Other abnormalities of gait and mobility (R26.89);Muscle weakness (generalized) (M62.81);Pain Pain - Right/Left: Right Pain - part of body: Shoulder     Time: 3007-6226 PT Time Calculation (min) (ACUTE ONLY): 24 min  Charges:  $Therapeutic Exercise: 8-22 mins $Therapeutic Activity: 8-22 mins                       Carney Living PT DPT  05/20/2019, 12:12 PM

## 2019-05-20 NOTE — Consult Note (Signed)
   [facility] CM Inpatient Consult   05/20/2019  Nicolas [name] Jan 24, 1966 [id number]   Chart reviewed for needs.  Patient with a HX with [facility] in the past. Chart review reveals patient is being recommended for inpatient rehab [CIR].  Patient with [facility] is eligible for post rehab follow up if needed.  Will follow for disposition.  Please refer or for questions, contact:  Natividad Brood, RN BSN [facility] Liaison  [phone] business mobile phone Toll free office [phone]  Fax number: [phone] [email or web address]

## 2019-05-20 NOTE — Plan of Care (Signed)
  Problem: Education: Goal: Required Educational Video(s) Outcome: Progressing   Problem: Clinical Measurements: Goal: Ability to maintain clinical measurements within normal limits will improve Outcome: Progressing Goal: Postoperative complications will be avoided or minimized Outcome: Progressing   Problem: Skin Integrity: Goal: Demonstration of wound healing without infection will improve Outcome: Progressing   Problem: Education: Goal: Knowledge of General Education information will improve Description: Including pain rating scale, medication(s)/side effects and non-pharmacologic comfort measures Outcome: Progressing   Problem: Health Behavior/Discharge Planning: Goal: Ability to manage health-related needs will improve Outcome: Progressing   Problem: Clinical Measurements: Goal: Ability to maintain clinical measurements within normal limits will improve Outcome: Progressing Goal: Will remain free from infection Outcome: Progressing Goal: Diagnostic test results will improve Outcome: Progressing Goal: Respiratory complications will improve Outcome: Progressing Goal: Cardiovascular complication will be avoided Outcome: Progressing   Problem: Activity: Goal: Risk for activity intolerance will decrease Outcome: Progressing   Problem: Nutrition: Goal: Adequate nutrition will be maintained Outcome: Progressing   Problem: Coping: Goal: Level of anxiety will decrease Outcome: Progressing   Problem: Elimination: Goal: Will not experience complications related to bowel motility Outcome: Progressing Goal: Will not experience complications related to urinary retention Outcome: Progressing   Problem: Pain Managment: Goal: General experience of comfort will improve Outcome: Progressing   Problem: Safety: Goal: Ability to remain free from injury will improve Outcome: Progressing   Problem: Skin Integrity: Goal: Risk for impaired skin integrity will  decrease Outcome: Progressing   Ival Bible, BSN, RN

## 2019-05-20 NOTE — Progress Notes (Signed)
   Providing Compassionate, Quality Care - Together   Subjective: Patient reports no issues overnight. Pain is adequately controlled with current medication regimen.Patient feels like his sensation is improving.  Objective: Vital signs in last 24 hours: Temp:  [97.8 F (36.6 C)-98.3 F (36.8 C)] 97.8 F (36.6 C) (06/24 0757) Pulse Rate:  [97-106] 105 (06/24 0757) Resp:  [16-20] 18 (06/24 0757) BP: (100-147)/(57-83) 147/81 (06/24 0757) SpO2:  [96 %-100 %] 96 % (06/24 0757)  Intake/Output from previous day: 06/23 0701 - 06/24 0700 In: 240 [P.O.:240] Out: 750 [Urine:750] Intake/Output this shift: Total I/O In: -  Out: 350 [Urine:350]  Alert and oriented x 4 PERRLA MAE, Strength 5/5 BUE, 3/5 BLE Incisions are clean, dry, and intact; Covered with steri strips  Lab Results: No results for input(s): WBC, HGB, HCT, PLT in the last 72 hours. BMET No results for input(s): NA, K, CL, CO2, GLUCOSE, BUN, CREATININE, CALCIUM in the last 72 hours.  Studies/Results: No results found.  Assessment/Plan: Patient is 6 days s/p T5-6, T11-12, and L3-4 laminectomies through three separate incisions. He has done well post operatively. He has worked with therapies who are recommending CIR. Initial request was denied. Dr. Naaman Plummer to conduct a peer to peer.   LOS: 6 days    -Continue to mobilize   Viona Gilmore, DNP, AGNP-C Nurse Practitioner  [facility] [place] [address], [place], [place], [place] [address] P: [phone]    F: [phone]  05/20/2019, 10:11 AM

## 2019-05-21 ENCOUNTER — Other Ambulatory Visit: Payer: Self-pay

## 2019-05-21 ENCOUNTER — Inpatient Hospital Stay (HOSPITAL_COMMUNITY)
Admission: RE | Admit: 2019-05-21 | Discharge: 2019-06-03 | DRG: 559 | Disposition: A | Discharge status: Home Health | Payer: Medicare HMO | Source: Intra-hospital | Attending: Physical Medicine & Rehabilitation | Admitting: Physical Medicine & Rehabilitation

## 2019-05-21 ENCOUNTER — Encounter (HOSPITAL_COMMUNITY): Payer: Self-pay | Admitting: *Deleted

## 2019-05-21 DIAGNOSIS — R7309 Other abnormal glucose: Secondary | ICD-10-CM

## 2019-05-21 DIAGNOSIS — Z87891 Personal history of nicotine dependence: Secondary | ICD-10-CM

## 2019-05-21 DIAGNOSIS — K592 Neurogenic bowel, not elsewhere classified: Secondary | ICD-10-CM | POA: Diagnosis not present

## 2019-05-21 DIAGNOSIS — I10 Essential (primary) hypertension: Secondary | ICD-10-CM | POA: Diagnosis not present

## 2019-05-21 DIAGNOSIS — Z7901 Long term (current) use of anticoagulants: Secondary | ICD-10-CM

## 2019-05-21 DIAGNOSIS — K5903 Drug induced constipation: Secondary | ICD-10-CM

## 2019-05-21 DIAGNOSIS — T50905A Adverse effect of unspecified drugs, medicaments and biological substances, initial encounter: Secondary | ICD-10-CM | POA: Diagnosis not present

## 2019-05-21 DIAGNOSIS — E669 Obesity, unspecified: Secondary | ICD-10-CM

## 2019-05-21 DIAGNOSIS — Z86711 Personal history of pulmonary embolism: Secondary | ICD-10-CM | POA: Diagnosis not present

## 2019-05-21 DIAGNOSIS — D72829 Elevated white blood cell count, unspecified: Secondary | ICD-10-CM | POA: Diagnosis present

## 2019-05-21 DIAGNOSIS — M549 Dorsalgia, unspecified: Secondary | ICD-10-CM | POA: Diagnosis not present

## 2019-05-21 DIAGNOSIS — Z6841 Body Mass Index (BMI) 40.0 and over, adult: Secondary | ICD-10-CM

## 2019-05-21 DIAGNOSIS — Z86718 Personal history of other venous thrombosis and embolism: Secondary | ICD-10-CM | POA: Diagnosis not present

## 2019-05-21 DIAGNOSIS — Z79899 Other long term (current) drug therapy: Secondary | ICD-10-CM

## 2019-05-21 DIAGNOSIS — E1169 Type 2 diabetes mellitus with other specified complication: Secondary | ICD-10-CM

## 2019-05-21 DIAGNOSIS — G8250 Quadriplegia, unspecified: Secondary | ICD-10-CM | POA: Diagnosis not present

## 2019-05-21 DIAGNOSIS — Z833 Family history of diabetes mellitus: Secondary | ICD-10-CM | POA: Diagnosis not present

## 2019-05-21 DIAGNOSIS — E1151 Type 2 diabetes mellitus with diabetic peripheral angiopathy without gangrene: Secondary | ICD-10-CM | POA: Diagnosis present

## 2019-05-21 DIAGNOSIS — M4714 Other spondylosis with myelopathy, thoracic region: Secondary | ICD-10-CM | POA: Diagnosis not present

## 2019-05-21 DIAGNOSIS — E871 Hypo-osmolality and hyponatremia: Secondary | ICD-10-CM

## 2019-05-21 DIAGNOSIS — K5900 Constipation, unspecified: Secondary | ICD-10-CM

## 2019-05-21 DIAGNOSIS — Z4789 Encounter for other orthopedic aftercare: Principal | ICD-10-CM

## 2019-05-21 DIAGNOSIS — G8220 Paraplegia, unspecified: Secondary | ICD-10-CM

## 2019-05-21 DIAGNOSIS — I2782 Chronic pulmonary embolism: Secondary | ICD-10-CM | POA: Diagnosis not present

## 2019-05-21 LAB — PROTIME-INR
INR: 1.3 — ABNORMAL HIGH (ref 0.8–1.2)
Prothrombin Time: 16 seconds — ABNORMAL HIGH (ref 11.4–15.2)

## 2019-05-21 MED ORDER — ONDANSETRON HCL 4 MG/2ML IJ SOLN: 4.0000 mg | Freq: Four times a day (QID) | INTRAMUSCULAR | Status: DC | PRN

## 2019-05-21 MED ORDER — POLYETHYLENE GLYCOL 3350 17 G PO PACK: 17.0000 g | PACK | Freq: Every day | ORAL | Status: DC | PRN

## 2019-05-21 MED ORDER — ACETAMINOPHEN 325 MG PO TABS
650.0000 mg | ORAL_TABLET | ORAL | Status: DC | PRN
  Administered 2019-06-01: 650 mg via ORAL
  Filled 2019-05-21: qty 2

## 2019-05-21 MED ORDER — DOCUSATE SODIUM 100 MG PO CAPS: 100.0000 mg | ORAL_CAPSULE | Freq: Two times a day (BID) | ORAL | 0 refills | Status: DC

## 2019-05-21 MED ORDER — OXYCODONE HCL 5 MG PO TABS
5.0000 mg | ORAL_TABLET | ORAL | Status: DC | PRN
  Administered 2019-05-22 – 2019-05-30 (×6): 5 mg via ORAL
  Filled 2019-05-21 (×9): qty 1

## 2019-05-21 MED ORDER — CYCLOBENZAPRINE HCL 10 MG PO TABS
10.0000 mg | ORAL_TABLET | Freq: Three times a day (TID) | ORAL | Status: DC | PRN
  Administered 2019-05-28 – 2019-06-03 (×4): 10 mg via ORAL
  Filled 2019-05-21 (×5): qty 1

## 2019-05-21 MED ORDER — WARFARIN - PHARMACIST DOSING INPATIENT
Freq: Every day | Status: DC
  Administered 2019-05-23 – 2019-05-27 (×3)

## 2019-05-21 MED ORDER — FUROSEMIDE 40 MG PO TABS
40.0000 mg | ORAL_TABLET | Freq: Every day | ORAL | Status: DC
  Administered 2019-05-22 – 2019-06-03 (×13): 40 mg via ORAL
  Filled 2019-05-21 (×13): qty 1

## 2019-05-21 MED ORDER — WARFARIN SODIUM 5 MG PO TABS
10.0000 mg | ORAL_TABLET | Freq: Once | ORAL | Status: AC
  Filled 2019-05-21: qty 2

## 2019-05-21 MED ORDER — CYCLOBENZAPRINE HCL 10 MG PO TABS: 10.0000 mg | ORAL_TABLET | Freq: Three times a day (TID) | ORAL | 0 refills | Status: DC | PRN

## 2019-05-21 MED ORDER — METOPROLOL SUCCINATE ER 50 MG PO TB24
50.0000 mg | ORAL_TABLET | ORAL | Status: DC
  Administered 2019-05-22 – 2019-06-03 (×13): 50 mg via ORAL
  Filled 2019-05-21 (×13): qty 1

## 2019-05-21 MED ORDER — GABAPENTIN 300 MG PO CAPS
300.0000 mg | ORAL_CAPSULE | Freq: Three times a day (TID) | ORAL | Status: DC
  Administered 2019-05-21 – 2019-06-03 (×38): 300 mg via ORAL
  Filled 2019-05-21 (×38): qty 1

## 2019-05-21 MED ORDER — BISACODYL 10 MG RE SUPP
10.0000 mg | Freq: Every day | RECTAL | Status: DC | PRN
  Administered 2019-05-22 – 2019-05-26 (×2): 10 mg via RECTAL
  Filled 2019-05-21 (×2): qty 1

## 2019-05-21 MED ORDER — ONDANSETRON HCL 4 MG PO TABS: 4.0000 mg | ORAL_TABLET | Freq: Four times a day (QID) | ORAL | Status: DC | PRN

## 2019-05-21 MED ORDER — POTASSIUM CHLORIDE CRYS ER 20 MEQ PO TBCR
20.0000 meq | EXTENDED_RELEASE_TABLET | ORAL | Status: DC
  Administered 2019-05-23 – 2019-06-02 (×6): 20 meq via ORAL
  Filled 2019-05-21 (×7): qty 1

## 2019-05-21 MED ORDER — OXYCODONE HCL 10 MG PO TABS: 10.0000 mg | ORAL_TABLET | ORAL | 0 refills | Status: DC | PRN

## 2019-05-21 MED ORDER — BISACODYL 10 MG RE SUPP: 10.0000 mg | Freq: Every day | RECTAL | Status: DC | PRN

## 2019-05-21 MED ORDER — WARFARIN SODIUM 5 MG PO TABS
5.0000 mg | ORAL_TABLET | Freq: Once | ORAL | Status: AC
  Administered 2019-05-21: 5 mg via ORAL
  Filled 2019-05-21: qty 1

## 2019-05-21 MED ORDER — ACETAMINOPHEN 650 MG RE SUPP: 650.0000 mg | RECTAL | Status: DC | PRN

## 2019-05-21 MED ORDER — FLEET ENEMA 7-19 GM/118ML RE ENEM
1.0000 | ENEMA | Freq: Every day | RECTAL | Status: DC | PRN
  Administered 2019-05-21: 10:00:00 1 via RECTAL
  Filled 2019-05-21: qty 1

## 2019-05-21 MED ORDER — SENNOSIDES-DOCUSATE SODIUM 8.6-50 MG PO TABS
2.0000 | ORAL_TABLET | Freq: Two times a day (BID) | ORAL | Status: DC
  Administered 2019-05-21 – 2019-06-03 (×26): 2 via ORAL
  Filled 2019-05-21 (×26): qty 2

## 2019-05-21 MED ORDER — SENNOSIDES-DOCUSATE SODIUM 8.6-50 MG PO TABS: 1.0000 | ORAL_TABLET | Freq: Two times a day (BID) | ORAL | Status: DC

## 2019-05-21 MED ORDER — OXYCODONE HCL 5 MG PO TABS
10.0000 mg | ORAL_TABLET | ORAL | Status: DC | PRN
  Administered 2019-05-21 – 2019-06-03 (×43): 10 mg via ORAL
  Filled 2019-05-21 (×43): qty 2

## 2019-05-21 MED ORDER — SORBITOL 70 % SOLN
30.0000 mL | Freq: Every day | Status: DC | PRN
  Administered 2019-05-24 – 2019-05-31 (×3): 30 mL via ORAL
  Filled 2019-05-21 (×3): qty 30

## 2019-05-21 MED ORDER — GABAPENTIN 300 MG PO CAPS: 300.0000 mg | ORAL_CAPSULE | Freq: Three times a day (TID) | ORAL | 1 refills | Status: DC

## 2019-05-21 NOTE — H&P (Signed)
Physical Medicine and Rehabilitation Admission H&P     Chief complaint: Back pain HPI: Nicolas [name] is a 53 year old right-handed male history of hypertension, clotting disorder with history of PE/DVT on chronic Coumadin followed by [facility] as well as cervical myelopathy with cervical disc surgery x2 and previous thoracic laminectomy.  Per chart review patient lives with 74 year old son.  He used a walker prior to admission.  He does have a lift chair.  1 level home with ramped entrance.  Son can assist as needed.  He also has a daughter in the area that works.  Presented 05/14/2019 with progressive lower extremity weakness right greater than left.  X-rays and imaging demonstrated thoracic and lumbar spinal stenosis with myelopathy.  Underwent T5-6 laminectomy, T11-12 laminectomy through a separate incision, L3-4 laminectomy through a separate incision 05/14/2019 per Dr. Arnoldo Morale.  Hospital course pain management.  No back brace required.  Chronic Coumadin has been resumed postoperatively.  Therapy evaluations completed and patient was admitted for a comprehensive rehab program.   Review of Systems  Constitutional: Positive for diaphoresis. Negative for chills and fever.  HENT: Negative for hearing loss.   Eyes: Negative for blurred vision and double vision.  Respiratory: Negative for cough and shortness of breath.   Cardiovascular: Positive for leg swelling. Negative for chest pain and palpitations.  Gastrointestinal: Positive for constipation. Negative for heartburn, nausea and vomiting.  Genitourinary: Positive for urgency. Negative for dysuria, flank pain and hematuria.  Musculoskeletal: Positive for back pain, joint pain and myalgias.  Skin: Negative for rash.  Neurological: Positive for focal weakness.  Psychiatric/Behavioral: Positive for depression. The patient has insomnia.   All other systems reviewed and are negative.       Past Medical History:  Diagnosis Date  .  Arthritis    . Clotting disorder ([place])      pulmonary embolus, DVT  . Depression    . Diabetes mellitus without complication ([facility])      no meds, watching diet  . DVT (deep venous thrombosis) ([place])      Summer 2012  . Hypertension    . Neuromuscular disorder ([employer])      spinal stenosis.- pt walks with a walker short distance  . Peripheral vascular disease ([place])    . PONV (postoperative nausea and vomiting)    . Pulmonary embolism ([place])      x 3 in 2005, 2008, 2010  . Sleep apnea      does not wear a c-pap  . Spinal stenosis          Past Surgical History:  Procedure Laterality Date  . ANTERIOR CERVICAL CORPECTOMY N/A 01/26/2019    Procedure: Cervical Four Corpectomy with Cervical Three-Four, Cervical Four-Five interbody fusion, prosthesis, explore old fusion, possible removal of old plate;  Surgeon: [name], MD;  Location: [facility];  Service: Neurosurgery;  Laterality: N/A;  anterior approach  . CHOLECYSTECTOMY N/A 05/24/2014    Procedure: LAPAROSCOPIC CHOLECYSTECTOMY;  Surgeon: Zenovia Jarred, MD;  Location: [facility];  Service: General;  Laterality: N/A;  . ERCP N/A 05/25/2014    Procedure: ENDOSCOPIC RETROGRADE CHOLANGIOPANCREATOGRAPHY (ERCP);  Surgeon: Beryle Beams, MD;  Location: [facility];  Service: Endoscopy;  Laterality: N/A;  . ERCP N/A 07/16/2014    Procedure: ENDOSCOPIC RETROGRADE CHOLANGIOPANCREATOGRAPHY (ERCP);  Surgeon: Beryle Beams, MD;  Location: [facility];  Service: Endoscopy;  Laterality: N/A;  . ivp filter   jan 2012  . LUMBAR LAMINECTOMY/DECOMPRESSION MICRODISCECTOMY N/A 05/14/2019  Procedure: LAMINECTOMY AND FORAMINOTOMY THORACIC FIVE- THORACIC SIX, THORACIC ELEVEN- THORACIC THORACIC, LUMBAR THREE- LUMBAR FOUR;  Surgeon: [name], MD;  Location: [facility];  Service: Neurosurgery;  Laterality: N/A;  posterior  . SPINE SURGERY   11/30/11    Total 6 back surgeries  . SPINE SURGERY   [id number]    Dr. Cathren Laine in [place], [place]  . [place]         08/2003, cervial spine 2005, 2010 lower back, 2011 lower back, 2012 & 2013  . UPPER GASTROINTESTINAL ENDOSCOPY      . WISDOM TOOTH EXTRACTION        2 removed        Family History  Problem Relation Age of Onset  . Cancer Mother 45        Lung  . Diabetes Father    . Prostate cancer Father    . Hypertension Father    . Hypertension Brother    . Hypertension Brother    . Diabetes Brother    . Heart attack Neg Hx    . Stroke Neg Hx    . Colon cancer Neg Hx    . Esophageal cancer Neg Hx    . Pancreatic cancer Neg Hx    . Rectal cancer Neg Hx    . Stomach cancer Neg Hx     Social History:  reports that he quit smoking about 18 years ago. His smoking use included cigarettes. He has a 15.00 pack-year smoking history. He has never used smokeless tobacco. He reports current alcohol use of about 1.0 standard drinks of alcohol per week. He reports current drug use. Drug: Marijuana. Allergies:       Allergies  Allergen Reactions  . Ace Inhibitors Other (See Comments)      cough  . Lisinopril Other (See Comments)      cough         Medications Prior to Admission  Medication Sig Dispense Refill  . enoxaparin (LOVENOX) 150 MG/ML injection Inject 1 mL (150 mg total) into the skin every 12 (twelve) hours. 20 Syringe 1  . furosemide (LASIX) 40 MG tablet Take 1 tablet (40 mg total) by mouth daily. 90 tablet 3  . metoprolol succinate (TOPROL-XL) 50 MG 24 hr tablet TAKE 1 TABLET EVERY EVENING WITH OR IMMEDIATELY FOLLOWING A MEAL (Patient taking differently: Take 50 mg by mouth every morning. ) 90 tablet 0  . Multiple Vitamin (MULTIVITAMIN WITH MINERALS) TABS tablet Take 1 tablet by mouth daily. Reported on 11/09/2015      . warfarin (COUMADIN) 10 MG tablet TAKE 1/2 TO 1 TABLET DAILY AS DIRECTED BY COUMADIN CLINIC (Patient taking differently: Take 5-10 mg by mouth See admin instructions. Take 1/2 to 1 tablet daily as directed by coumadin clinic;  Sun, Tues, Thurs, 10 mg, and all other days is 5  mg) 80 tablet 0  . potassium chloride SA (K-DUR,KLOR-CON) 20 MEQ tablet Take 1 tablet (20 mEq total) by mouth as needed (with lasix). (Patient taking differently: Take 20 mEq by mouth every other day. ) 90 tablet 3     Drug Regimen Review Drug regimen was reviewed and remains appropriate with no significant issues identified   Home: Home Living Family/patient expects to be discharged to:: Private residence Living Arrangements: Children Available Help at Discharge: Family, Available PRN/intermittently Type of Home: House Home Access: Ramped entrance Home Layout: One level Bathroom Shower/Tub: Chiropodist: Standard Home Equipment: Environmental consultant - 2 wheels, Tub bench, Wheelchair - power,  Hospital bed, Adaptive equipment Adaptive Equipment: Reacher Additional Comments: alone during the day   Functional History: Prior Function Level of Independence: Needs assistance Gait / Transfers Assistance Needed: only walks in home with RW, uses power chair in community ADL's / Homemaking Assistance Needed: pt does the cooking/washing dishes from power chair that lifts him up 4' Comments: Patient reports he can walk 50-60' at a time    Functional Status:  Mobility: Bed Mobility Overal bed mobility: Needs Assistance Bed Mobility: Rolling, Sidelying to Sit Rolling: Min assist Sidelying to sit: Mod assist Supine to sit: Mod assist Sit to supine: Mod assist, +2 for physical assistance Sit to sidelying: Min assist General bed mobility comments: Mod a to edge of bed today. Increased assist required to sit up.  Transfers Overall transfer level: Needs assistance Equipment used: Sliding board Transfer via Lift Equipment: Stedy Transfers: Sit to/from Stand Sit to Stand: +2 physical assistance, Max assist  Lateral/Scoot Transfers: Max assist General transfer comment: had to stop the first slide board transfer and re-adjust. Max a on the second trial to assist with strength and get him  back into the chair. Did not perfrom sit to stands from the chair because of +1 help and a lower surface.        ADL: ADL Overall ADL's : Needs assistance/impaired Eating/Feeding: Modified independent Grooming: Modified independent Upper Body Bathing: Minimal assistance Lower Body Bathing: Maximal assistance, Bed level Upper Body Dressing : Minimal assistance Lower Body Dressing: +2 for physical assistance, Sit to/from stand Toilet Transfer: Moderate assistance, Transfer board, RW, Requires drop arm Toilet Transfer Details (indicate cue type and reason): simulated with drop arm recliner General ADL Comments: Sliding board transfer from EOB to drop arm recliner due to BLE (R>L) weakness. Assist to block RLE and facilitate boosting hips using pad. Extra time and effort.    Cognition: Cognition Overall Cognitive Status: Within Functional Limits for tasks assessed Orientation Level: Oriented X4 Cognition Arousal/Alertness: Awake/alert Behavior During Therapy: WFL for tasks assessed/performed Overall Cognitive Status: Within Functional Limits for tasks assessed   Physical Exam: Blood pressure (!) 137/58, pulse (!) 116, temperature 98.2 F (36.8 C), temperature source Oral, resp. rate 18, height 5\' 10"  (1.778 m), weight (!) 139.6 kg, SpO2 94 %. Physical Exam  Neurological:  Patient is alert pleasant cooperative with exam.  Oriented to person place and time.  He is a good medical historian.  Follows full commands  Skin:  Incision is dressed clean and dry    General: No acute distress Mood and affect are appropriate Heart: Regular rate and rhythm no rubs murmurs or extra sounds Lungs: Clear to auscultation, breathing unlabored, no rales or wheezes Abdomen: Positive bowel sounds, soft nontender to palpation, nondistended Extremities: No clubbing, cyanosis, or edema Skin: No evidence of breakdown, no evidence of rash Neurologic: Cranial nerves II through XII intact, motor strength is  5/5 in bilateral deltoid, bicep, tricep, grip,3- hip flexor, knee extensors, ankle dorsiflexor and plantar flexor Sensory exam normal sensation to sensory paresthesias below the xiphoid process including both legs.  Absent sensation left little toe otherwise can localize  Musculoskeletal: Full range of motion in all 4 extremities. No joint swelling   Lab Results Last 48 Hours        Results for orders placed or performed during the hospital encounter of 05/14/19 (from the past 48 hour(s))  Protime-INR     Status: None    Collection Time: 05/20/19  5:13 AM  Result Value Ref Range  Prothrombin Time 14.4 11.4 - 15.2 seconds    INR 1.1 0.8 - 1.2      Comment: (NOTE) INR goal varies based on device and disease states. Performed at [facility], [place] [address], [place], [place] [address]    Protime-INR     Status: Abnormal    Collection Time: 05/21/19  4:37 AM  Result Value Ref Range    Prothrombin Time 16.0 (H) 11.4 - 15.2 seconds    INR 1.3 (H) 0.8 - 1.2      Comment: (NOTE) INR goal varies based on device and disease states. Performed at [facility], [place] [address], [place], [place] [address]       Imaging Results (Last 48 hours)  No results found.           Medical Problem List and Plan: 1.  Progressive quadriparesis secondary to thoracic lumbar myelopathy status post T5-6 laminectomy, T11-12 laminectomy, L3-4 laminectomy 05/14/2019 as well as history of prior cervical discectomy and thoracic laminectomy in the past.  No brace required 2.  Antithrombotics: -DVT/anticoagulation: SCDs.  Chronic Coumadin resumed             -antiplatelet therapy: N/A 3. Pain Management: Neurontin 300 mg 3 times daily, Flexeril and oxycodone as needed 4. Mood: Provide emotional support             -antipsychotic agents: N/A 5. Neuropsych: This patient is capable of making decisions on his own behalf. 6. Skin/Wound Care: Routine skin checks 7. Fluids/Electrolytes/Nutrition:  Routine in and outs with follow-up chemistries 8.  History of clotting disorder pulmonary emboli DVT.  Discussed with neurosurgery when to resume back chronic Coumadin.  Home 9.  Hypertension.  Toprol-XL 50 mg daily, Lasix 40 mg daily. 10.  Constipation.  Laxative assistance      Post Admission Physician Evaluation: 1. Functional deficits secondary  to paraparesis, thoracic myelopathy. 2. Patient admitted to receive collaborative, interdisciplinary care between the physiatrist, rehab nursing staff, and therapy team. 3. Patient's level of medical complexity and substantial therapy needs in context of that medical necessity cannot be provided at a lesser intensity of care. 4. Patient has experienced substantial functional loss from his/her baseline.Judging by the patient's diagnosis, physical exam, and functional history, the patient has potential for functional progress which will result in measurable gains while on inpatient rehab.  These gains will be of substantial and practical use upon discharge in facilitating mobility and self-care at the household level. 5. Physiatrist will provide 24 hour management of medical needs as well as oversight of the therapy plan/treatment and provide guidance as appropriate regarding the interaction of the two. 6. 24 hour rehab nursing will assist in the management of  bladder management, bowel management, safety, skin/wound care, disease management, medication administration, pain management and patient education  and help integrate therapy concepts, techniques,education, etc. 7. PT will assess and treat for:pre gait, gait training, endurance , safety, equipment, neuromuscular re education  .  Goals are: minimal assist. 8. OT will assess and treat for   ADLs, Cognitive perceptual skills, Neuromuscular re education, safety, endurance, equipment  .  Goals are: minimal assist.  9. SLP will assess and treat for  .  Goals are: N/A. 10. Case Management and Social Worker  will assess and treat for psychological issues and discharge planning. 11. Team conference will be held weekly to assess progress toward goals and to determine barriers to discharge. 12.  Patient will receive at least 3 hours of  therapy per day at least 5 days per week. 13. ELOS and Prognosis: 16-20d good  "I have personally performed a face to face diagnostic evaluation of this patient.  Additionally, I have reviewed and concur with the physician assistant's documentation above."  [name] M.D. [facility] FAAPM&R (Sports Med, Neuromuscular Med) Diplomate Am Board of Electrodiagnostic Med     Elizabeth Sauer 05/21/2019

## 2019-05-21 NOTE — TOC Transition Note (Signed)
Transition of Care [facility]) - CM/SW Discharge Note   Patient Details  Name: Nicolas [name] MRN: [id number] Date of Birth: 26-Apr-1966  [facility]) CM/SW Contact:  Pollie Friar, RN Phone Number: 05/21/2019, 11:48 AM   Clinical Narrative:    Pt discharging to CIR today. TOC signing off.   Final next level of care: IP Rehab Facility Barriers to Discharge: No Barriers Identified   Patient Goals and CMS Choice        Discharge Placement                       Discharge Plan and Services                                     Social Determinants of Health (SDOH) Interventions     Readmission Risk Interventions No flowsheet data found.

## 2019-05-21 NOTE — Progress Notes (Signed)
Nicolas Blake, MD  Physician  Physical Medicine and Rehabilitation  PMR Pre-admission  Addendum  Date of Service:  05/18/2019 5:09 PM      Related encounter: Admission (Discharged) from 05/14/2019 in [facility]         PMR Admission Coordinator Pre-Admission Assessment  Patient: Nicolas [name] is an 53 y.o., male MRN: [id number] DOB: Mar 28, 1966 Height: '5\' 10"'  (177.8 cm) Weight: (!) 139.6 kg  Insurance Information HMO: yes    PPO:      PCP:      IPA:      80/20:      OTHER:  PRIMARY: [employer]      Policy#: [id number]      Subscriber: patient CM Name: [name]      Phone#: [phone] x [phone]     Fax#: [phone]  Pre-Cert#: [id number]      Employer:  [name] provided by [name] for admit to CIR on 05/21/19. Weekly updates are due to [name] (f): [phone] (p): [phone] [id number] Benefits:  Phone #: online     Name: [email or web address]  Eff. Date: 11/26/18 still active     Deduct: $0      Out of Pocket Max: %5,400 ($1,928.89)      Life Max: NA CIR: $295/day co pay for days 1-6, $0/day co-pay for days 7+      SNF: $0/day co-pay for days 1-20, $178/day co-pay for days 21-100, limited to 100 days/cal yr Outpatient: visits limited by medical necessity     Co-Pay: $10-40/visit co-pay pending service Home Health: 100%      Co-Pay: 0% DME: 80%     Co-Pay: 20% Providers:  SECONDARY:       Policy#:       Subscriber:  CM Name:       Phone#:      Fax#:  Pre-Cert#:       Employer:  Benefits:  Phone #:      Name:  Eff. Date:      Deduct:       Out of Pocket Max:       Life Max:  CIR:       SNF:  Outpatient:      Co-Pay:  Home Health:       Co-Pay:  DME:      Co-Pay:   Medicaid Application Date:       Case Manager:  Disability Application Date:       Case Worker:   The "Data Collection Information Summary" for patients in Inpatient Rehabilitation Facilities with attached "Privacy Act [place] Records" was provided and verbally  reviewed with: Patient  Emergency Contact Information         Contact Information    Name Relation Home Work Mobile   Nicolas [name] Significant other [phone]  [phone]   [facility] Daughter [phone]     Nicolas [name] Father   [phone]      Current Medical History  Patient Admitting Diagnosis: thoracic myelopathy   History of Present Illness: Nicolas [name] is a 53 year old male history of hypertension, clotting disorder with history of PE/DVT on chronic Coumadin followed by [facility] as well as cervical myelopathy with cervical disc surgery x2 and previous thoracic laminectomy.  Presented 05/14/2019 with progressive lower extremity weakness right greater than left. X-rays and imaging demonstrated thoracic and lumbar spinal stenosis with myelopathy. Underwent T5-6 laminectomy, T11-12 laminectomy through a separate incision, L3-4 laminectomy through a separate incision 05/14/2019 per Dr.  Jenkins. Hospital course pain management. No back brace required. Chronic Coumadin has been resumed postoperatively.Therapy evaluations completed and patient is to be admitted for a comprehensive rehab program on 05/21/19.     Patient's medical record from [facility] has been reviewed by the rehabilitation admission coordinator and physician.  Past Medical History      Past Medical History:  Diagnosis Date  . Arthritis   . Clotting disorder ([place])    pulmonary embolus, DVT  . Depression   . Diabetes mellitus without complication (HCC)    no meds, watching diet  . DVT (deep venous thrombosis) ([place])    Summer 2012  . Hypertension   . Neuromuscular disorder ([place])    spinal stenosis.- pt walks with a walker short distance  . Peripheral vascular disease ([place])   . PONV (postoperative nausea and vomiting)   . Pulmonary embolism ([place])    x 3 in 2005, 2008, 2010  . Sleep apnea    does not wear a c-pap  . Spinal  stenosis     Family History   family history includes Cancer (age of onset: 62) in his mother; Diabetes in his brother and father; Hypertension in his brother, brother, and father; Prostate cancer in his father.  Prior Rehab/Hospitalizations Has the patient had prior rehab or hospitalizations prior to admission? Yes  Has the patient had major surgery during 100 days prior to admission? Yes             Current Medications  Current Facility-Administered Medications:  .  0.9 %  sodium chloride infusion, 250 mL, Intravenous, Continuous, [name], MD, Last Rate: 1 mL/hr at 05/15/19 0002, 250 mL at 05/15/19 0002 .  acetaminophen (TYLENOL) tablet 650 mg, 650 mg, Oral, Q4H PRN **OR** acetaminophen (TYLENOL) suppository 650 mg, 650 mg, Rectal, Q4H PRN, [name], MD .  bisacodyl (DULCOLAX) suppository 10 mg, 10 mg, Rectal, Daily PRN, [name], MD .  bupivacaine liposome (EXPAREL) 1.3 % injection 266 mg, 20 mL, Infiltration, Once, [name], MD .  cyclobenzaprine (FLEXERIL) tablet 10 mg, 10 mg, Oral, TID PRN, [name], MD, 10 mg at 05/21/19 4801 .  docusate sodium (COLACE) capsule 100 mg, 100 mg, Oral, BID, [name], MD, 100 mg at 05/21/19 6553 .  furosemide (LASIX) tablet 40 mg, 40 mg, Oral, Daily, [name], MD, 40 mg at 05/21/19 0814 .  gabapentin (NEURONTIN) capsule 300 mg, 300 mg, Oral, TID, [name], MD, 300 mg at 05/21/19 0813 .  menthol-cetylpyridinium (CEPACOL) lozenge 3 mg, 1 lozenge, Oral, PRN **OR** phenol (CHLORASEPTIC) mouth spray 1 spray, 1 spray, Mouth/Throat, PRN, [name], MD .  metoprolol succinate (TOPROL-XL) 24 hr tablet 50 mg, 50 mg, Oral, Enid Derry, MD, 50 mg at 05/21/19 0610 .  morphine 4 MG/ML injection 4 mg, 4 mg, Intravenous, Q2H PRN, [name], MD, 4 mg at 05/17/19 0424 .  ondansetron (ZOFRAN) tablet 4 mg, 4 mg, Oral, Q6H PRN **OR** ondansetron (ZOFRAN) injection 4 mg, 4 mg,  Intravenous, Q6H PRN, [name], MD, 4 mg at 05/14/19 2252 .  oxyCODONE (Oxy IR/ROXICODONE) immediate release tablet 10 mg, 10 mg, Oral, Q3H PRN, [name], MD, 10 mg at 05/21/19 0814 .  oxyCODONE (Oxy IR/ROXICODONE) immediate release tablet 5 mg, 5 mg, Oral, Q3H PRN, [name], MD, 5 mg at 05/15/19 1617 .  polyethylene glycol (MIRALAX / GLYCOLAX) packet 17 g, 17 g, Oral, Daily PRN, [name], MD, 17 g at 05/21/19 0815 .  potassium chloride  SA (K-DUR) CR tablet 20 mEq, 20 mEq, Oral, Manya Silvas, MD, 20 mEq at 05/21/19 0814 .  sodium chloride flush (NS) 0.9 % injection 3 mL, 3 mL, Intravenous, Q12H, [name], MD, 3 mL at 05/20/19 2134 .  sodium chloride flush (NS) 0.9 % injection 3 mL, 3 mL, Intravenous, PRN, [name], MD, 3 mL at 05/16/19 1130 .  sodium phosphate (FLEET) 7-19 GM/118ML enema 1 enema, 1 enema, Rectal, Daily PRN, [name], MD, 1 enema at 05/21/19 [phone] .  warfarin (COUMADIN) tablet 5 mg, 5 mg, Oral, ONCE-1800, Bertis Ruddy, RPH .  Warfarin - Pharmacist Dosing Inpatient, , Does not apply, q1800, Dang, Thuy D, RPH  Patients Current Diet:     Diet Order                  Diet - low sodium heart healthy         Diet - low sodium heart healthy         Diet regular Room service appropriate? Yes; Fluid consistency: Thin  Diet effective now               Precautions / Restrictions Precautions Precautions: Back, Fall Precaution Comments: revirewed BLT percautions  Restrictions Weight Bearing Restrictions: No   Has the patient had 2 or more falls or a fall with injury in the past year? No  Prior Activity Level Limited Community (1-2x/wk): not working or driving PTA; pt did use RW for short distance for exercise and a powerchair for mobilization for most activities. Pt did get out of the house daily per his report  Prior Functional Level Self Care: Did the patient need help bathing, dressing,  using the toilet or eating? Independent  Indoor Mobility: Did the patient need assistance with walking from room to room (with or without device)? Independent  Stairs: Did the patient need assistance with internal or external stairs (with or without device)? Unknown  Functional Cognition: Did the patient need help planning regular tasks such as shopping or remembering to take medications? [place] / Equipment Home Assistive Devices/Equipment: Wheelchair, Environmental consultant (specify type), Cane (specify quad or straight), Blood pressure cuff, Tub transfer bench Home Equipment: Walker - 2 wheels, Tub bench, Wheelchair - power, Hospital bed, Adaptive equipment  Prior Device Use: Indicate devices/aids used by the patient prior to current illness, exacerbation or injury? Motorized wheelchair or scooter and Barista  Overall Cognitive Status: Within Functional Limits for tasks assessed Orientation Level: Oriented X4    Extremity Assessment (includes Sensation/Coordination)  Upper Extremity Assessment: Generalized weakness  Lower Extremity Assessment: Defer to PT evaluation RLE Deficits / Details: 2+/5 ankle DF , LAQ 3/5  LLE Deficits / Details: 3+/5 knee extension    ADLs  Overall ADL's : Needs assistance/impaired Eating/Feeding: Modified independent Grooming: Modified independent Upper Body Bathing: Minimal assistance Lower Body Bathing: Maximal assistance, Bed level Upper Body Dressing : Minimal assistance Lower Body Dressing: +2 for physical assistance, Sit to/from stand Toilet Transfer: Moderate assistance, Transfer board, RW, Requires drop arm Toilet Transfer Details (indicate cue type and reason): simulated with drop arm recliner General ADL Comments: Sliding board transfer from EOB to drop arm recliner due to BLE (R>L) weakness. Assist to block RLE and facilitate boosting hips using pad. Extra time and effort.      Mobility  Overal bed mobility: Needs Assistance Bed Mobility: Rolling, Sidelying to Sit Rolling: Min assist Sidelying to sit: Mod assist Supine to sit:  Mod assist Sit to supine: Mod assist, +2 for physical assistance Sit to sidelying: Min assist General bed mobility comments: Mod a to edge of bed today. Increased assist required to sit up.     Transfers  Overall transfer level: Needs assistance Equipment used: Sliding board Transfer via Lift Equipment: Stedy Transfers: Sit to/from Stand Sit to Stand: +2 physical assistance, Max assist  Lateral/Scoot Transfers: Max assist General transfer comment: had to stop the first slide board transfer and re-adjust. Max a on the second trial to assist with strength and get him back into the chair. Did not perfrom sit to stands from the chair because of +1 help and a lower surface.     Ambulation / Gait / Stairs / Office manager / Balance Dynamic Sitting Balance Sitting balance - Comments: improved sitting balance today  Balance Overall balance assessment: Needs assistance Sitting-balance support: No upper extremity supported, Feet supported, Single extremity supported, Bilateral upper extremity supported Sitting balance-Leahy Scale: Good Sitting balance - Comments: improved sitting balance today  Standing balance support: Bilateral upper extremity supported Standing balance-Leahy Scale: Poor Standing balance comment: min-modA for steadying in standing    Special needs/care consideration BiPAP/CPAP : no CPM : no Continuous Drip IV : no Dialysis : no        Days : no Life Vest : no Oxygen : no Special Bed no Trach Size : no Wound Vac (area) : no      Location : no Skin : surgical incision to back                          Bowel mgmt: continent, last BM 05/14/19 Bladder mgmt: continent Diabetic mgmt: yes Behavioral consideration : no Chemo/radiation : no   Previous Home Environment (from acute therapy  documentation) Living Arrangements: Children Available Help at Discharge: Family, Available PRN/intermittently Type of Home: House Home Layout: One level Home Access: Ramped entrance Bathroom Shower/Tub: Chiropodist: Standard Home Care Services: No Additional Comments: alone during the day  Discharge Living Setting Plans for Discharge Living Setting: Patient's home, Lives with (comment)(son (19 yo)) Type of Home at Discharge: House Discharge Home Layout: One level Discharge Home Access: Leon entrance Discharge Bathroom Shower/Tub: Tub/shower unit(tub transfer bench) Discharge Bathroom Toilet: Standard Discharge Bathroom Accessibility: Yes How Accessible: Accessible via walker Does the patient have any problems obtaining your medications?: No  Social/Family/Support Systems Patient Roles: Other (Comment)(parent to 89 yo ) Contact Information: son: Roarke Marciano ([phone]) Anticipated Caregiver: son Anticipated Caregiver's Contact Information: see above Ability/Limitations of Caregiver: Min A Caregiver Availability: Other (Comment)(son works nights (available daytime); daughter close by) Discharge Plan Discussed with Primary Caregiver: Yes Is Caregiver In Agreement with Plan?: Yes Does Caregiver/Family have Issues with Lodging/Transportation while Pt is in Rehab?: No  Goals/Additional Needs Patient/Family Goal for Rehab: PT/OT: Min A; SLP: NA Expected length of stay: 16-20 days Cultural Considerations: NA Dietary Needs: regular diet, thin liquids Equipment Needs: TBD Pt/Family Agrees to Admission and willing to participate: Yes Program Orientation Provided & Reviewed with Pt/Caregiver Including Roles  & Responsibilities: Yes(pt and son)  Barriers to Discharge: Home environment access/layout  Barriers to Discharge Comments: bathroom not accessible to powerchair or WC; son works at nighttime   Decrease burden of Care through IP rehab admission: NA   Possible need for SNF placement upon discharge: Not anticipated; pt has good social support at DC and has good prognosis for  further progress through CIR.   Patient Condition: I have reviewed medical records from [facility], spoken with pt, and son. I met with patient at the bedside for inpatient rehabilitation assessment.  Patient will benefit from ongoing PT and OT, can actively participate in 3 hours of therapy a day 5 days of the week, and can make measurable gains during the admission.  Patient will also benefit from the coordinated team approach during an Inpatient Acute Rehabilitation admission.  The patient will receive intensive therapy as well as Rehabilitation physician, nursing, social worker, and care management interventions.  Due to bladder management, bowel management, safety, skin/wound care, disease management, medication administration, pain management and patient education the patient requires 24 hour a day rehabilitation nursing.  The patient is currently Mod A for lateral scoot transfers mobility and mod/Total A for basic ADLs.  Discharge setting and therapy post discharge at home with home health is anticipated.  Patient has agreed to participate in the Acute Inpatient Rehabilitation Program and will admit to CIR.Marland Kitchen  Preadmission Screen Completed By:  Jhonnie Garner, 05/21/2019 12:20 PM ______________________________________________________________________   Discussed status with Dr. Letta Pate on 05/21/19 at 12:19PM and received approval for admission today.  Admission Coordinator:  Jhonnie Garner, OT, time 12:19PM/Date 05/21/19.   Assessment/Plan: Diagnosis:Cervical myelopathy 1. Does the need for close, 24 hr/day Medical supervision in concert with the patient's rehab needs make it unreasonable for this patient to be served in a less intensive setting? Yes 2. Co-Morbidities requiring supervision/potential complications: Hypercoagulable state with hx DVT and PE on  chronic coumadin, HTN 3. Due to bladder management, bowel management, safety, skin/wound care, disease management, medication administration, pain management and patient education, does the patient require 24 hr/day rehab nursing? Yes 4. Does the patient require coordinated care of a physician, rehab nurse, PT (1-2 hrs/day, 5 days/week) and OT (1-2 hrs/day, 5 days/week) to address physical and functional deficits in the context of the above medical diagnosis(es)? Yes Addressing deficits in the following areas: balance, endurance, locomotion, strength, transferring, bowel/bladder control, bathing, dressing, feeding, grooming, toileting and psychosocial support 5. Can the patient actively participate in an intensive therapy program of at least 3 hrs of therapy 5 days a week? Yes 6. The potential for patient to make measurable gains while on inpatient rehab is good 7. Anticipated functional outcomes upon discharge from inpatients are: min assist PT, min assist OT, n/a SLP 8. Estimated rehab length of stay to reach the above functional goals is: 16-20d 9. Anticipated D/C setting: Home 10. Anticipated post D/C treatments: [place] therapy 11. Overall Rehab/Functional Prognosis: good  MD Signature: Nicolas [name] M.D. [facility] FAAPM&R (Sports Med, Neuromuscular Med) Diplomate Am Board of Electrodiagnostic Med       Revision History

## 2019-05-21 NOTE — Progress Notes (Signed)
ANTICOAGULATION CONSULT NOTE   Pharmacy Consult:  Coumadin Indication:  History of recurrent VTEs  Allergies  Allergen Reactions  . Ace Inhibitors Other (See Comments)    cough  . Lisinopril Other (See Comments)    cough    Patient Measurements: Height: 5\' 10"  (177.8 cm) Weight: (!) 307 lb 11.2 oz (139.6 kg) IBW/kg (Calculated) : 73  Vital Signs: Temp: 98.2 F (36.8 C) (06/25 0357) Temp Source: Oral (06/25 0357) BP: 132/90 (06/25 1423) Pulse Rate: 115 (06/25 0608)  Labs: Recent Labs    05/20/19 0513 05/21/19 0437  LABPROT 14.4 16.0*  INR 1.1 1.3*    Estimated Creatinine Clearance: 152.2 mL/min (by C-G formula based on SCr of 0.74 mg/dL).   Medical History: Past Medical History:  Diagnosis Date  . Arthritis   . Clotting disorder ([place])    pulmonary embolus, DVT  . Depression   . Diabetes mellitus without complication (HCC)    no meds, watching diet  . DVT (deep venous thrombosis) ([place])    Summer 2012  . Hypertension   . Neuromuscular disorder ([place])    spinal stenosis.- pt walks with a walker short distance  . Peripheral vascular disease ([place])   . PONV (postoperative nausea and vomiting)   . Pulmonary embolism ([place])    x 3 in 2005, 2008, 2010  . Sleep apnea    does not wear a c-pap  . Spinal stenosis     Assessment: 61 YOM with progressive lower extremity weakness s/p laminectomies on 05/14/19.  Pharmacy consulted to resume Coumadin from PTA for history of recurrent VTEs.  Last INR sub-therapeutic at 1.   INR subtherapeutic at 1.3 but uptrend from 1.1 yesterday s/p restart on 6/23  Home Coumadin dose: 5mg  daily except 10mg  on Tues/Thurs/Sun  Goal of Therapy:  INR 2-3 Monitor platelets by anticoagulation protocol: Yes   Plan:   Coumadin 5mg  PO x 1 tonight Daily INR, s/s bleeding  Bertis Ruddy, PharmD Clinical Pharmacist Please check AMION for all [place] numbers 05/21/2019 7:27 AM

## 2019-05-21 NOTE — Plan of Care (Signed)
  Problem: Pain Managment: Goal: General experience of comfort will improve Outcome: Progressing   

## 2019-05-21 NOTE — Progress Notes (Signed)
Patient arrived from 3W09 with RN and NT. Patient belongings brought with patient. Patient oriented to room, rehab schedule, rehab process, fall prevention plan, safety plan, and health resource notebook with verbal understanding. Patient received pain medication PTA and rates pain 5/10. Patient is resting comfortably in bed with call bell at side and dinner has been ordered.

## 2019-05-21 NOTE — Progress Notes (Signed)
Subjective: The patient is alert and pleasant.  He is in no apparent distress.  He is awaiting rehab placement.  Complains of constipation.  Objective: Vital signs in last 24 hours: Temp:  [98.2 F (36.8 C)-99.8 F (37.7 C)] 98.3 F (36.8 C) (06/25 0809) Pulse Rate:  [107-121] 121 (06/25 0809) Resp:  [18] 18 (06/25 0809) BP: (105-157)/(58-90) 157/88 (06/25 0809) SpO2:  [94 %-96 %] 95 % (06/25 0809) Estimated body mass index is 44.15 kg/m as calculated from the following:   Height as of this encounter: 5\' 10"  (1.778 m).   Weight as of this encounter: 139.6 kg.   Intake/Output from previous day: 06/24 0701 - 06/25 0700 In: -  Out: 850 [Urine:850] Intake/Output this shift: No intake/output data recorded.  Physical exam the patient is alert and pleasant.  He remains quadriparetic.  He is weakness in his right lower extremity without change.  Lab Results: No results for input(s): WBC, HGB, HCT, PLT in the last 72 hours. BMET No results for input(s): NA, K, CL, CO2, GLUCOSE, BUN, CREATININE, CALCIUM in the last 72 hours.  Studies/Results: No results found.  Assessment/Plan: Cervical myelopathy, thoracic myelopathy, lumbar stenosis with neurogenic claudication: The patient is slowly recovering from his surgery.  I think he would benefit from inpatient rehab.  We are awaiting approval.  Constipation: I will add a suppository and enema.  LOS: 7 days     Ophelia Charter 05/21/2019, 8:14 AM

## 2019-05-21 NOTE — Plan of Care (Signed)
  Problem: Consults Goal: RH SPINAL CORD INJURY PATIENT EDUCATION Description:  See Patient Education module for education specifics.  Outcome: Progressing Goal: Skin Care Protocol Initiated - if Braden Score 18 or less Description: If consults are not indicated, leave blank or document N/A Outcome: Progressing   Problem: RH SKIN INTEGRITY Goal: RH STG SKIN FREE OF INFECTION/BREAKDOWN Description: Skin to remain free from infection and breakdown with mod assist. Outcome: Progressing Goal: RH STG MAINTAIN SKIN INTEGRITY WITH ASSISTANCE Description: STG Maintain Skin Integrity With mod Assistance. Outcome: Progressing   Problem: RH SAFETY Goal: RH STG ADHERE TO SAFETY PRECAUTIONS W/ASSISTANCE/DEVICE Description: STG Adhere to Safety Precautions With mod Assistance and appropriate assistive Device. Outcome: Progressing

## 2019-05-21 NOTE — Progress Notes (Signed)
Inpatient Rehabilitation-Admissions Coordinator   Peer to peer was overturned with pt now approved for admit to CIR today. AC has contacted Dr. Arnoldo Morale who has given medical clearance for admit to CIR. AC will update RN, SW/CM on plan.   Please call if questions.   Jhonnie Garner, OTR/L  Rehab Admissions Coordinator  [phone] 05/21/2019 11:28 AM

## 2019-05-21 NOTE — Discharge Summary (Signed)
Physician Discharge Summary  Patient ID: Nicolas [name] MRN: [id number] DOB/AGE: [birth year] 53 y.o.  Admit date: 05/14/2019 Discharge date: 05/21/2019  Admission Diagnoses: Cervical myelopathy, thoracic myelopathy, lumbar spinal stenosis with neurogenic claudication  Discharge Diagnoses: The same Active Problems:   Thoracic myelopathy   Discharged Condition: fair  Hospital Course: I performed a T4-5, T11-12 and L3-4 laminectomy on the patient on 05/14/2019.  The patient is quadriparetic chronically.  Postoperatively he complained of back pain.  He was worked up with a CT of the thoracic and lumbar spine which did not demonstrate any epidural hematoma.  He had some groin pain postoperatively which was treated with Neurontin.  He also complained of constipation which was treated with stool softeners, suppositories and as needed enema.  We had PT and OT see the patient.  Arrangements were made for inpatient rehab.  He was transferred on 05/21/2019.  Consults: PT, OT, rehab, care management Significant Diagnostic Studies: Thoracic and lumbar CT Treatments: T4-5, T11-12 and L3-4 laminectomy Discharge Exam: Blood pressure (!) 157/88, pulse (!) 121, temperature 98.3 F (36.8 C), temperature source Oral, resp. rate 18, height 5\' 10"  (1.778 m), weight (!) 139.6 kg, SpO2 95 %. The patient is   alert and pleasant.  He is paraparetic with right greater left lower extremity strength like he was preop.  Disposition: Inpatient rehab  Discharge Instructions    Call MD for:  difficulty breathing, headache or visual disturbances   Complete by: As directed    Call MD for:  extreme fatigue   Complete by: As directed    Call MD for:  hives   Complete by: As directed    Call MD for:  persistant dizziness or light-headedness   Complete by: As directed    Call MD for:  persistant nausea and vomiting   Complete by: As directed    Call MD for:  redness, tenderness, or signs of infection (pain, swelling,  redness, odor or green/yellow discharge around incision site)   Complete by: As directed    Call MD for:  severe uncontrolled pain   Complete by: As directed    Call MD for:  temperature >100.4   Complete by: As directed    Diet - low sodium heart healthy   Complete by: As directed    Discharge instructions   Complete by: As directed    Call [phone] for a followup appointment. Take a stool softener while you are using pain medications.   Driving Restrictions   Complete by: As directed    Do not drive for 2 weeks.   Increase activity slowly   Complete by: As directed    Lifting restrictions   Complete by: As directed    Do not lift more than 5 pounds. No excessive bending or twisting.   May shower / Bathe   Complete by: As directed    Remove the dressing for 3 days after surgery.  You may shower, but leave the incision alone.   No dressing needed   Complete by: As directed      Allergies as of 05/21/2019      Reactions   Ace Inhibitors Other (See Comments)   cough   Lisinopril Other (See Comments)   cough      Medication List    STOP taking these medications   enoxaparin 150 MG/ML injection Commonly known as: Lovenox     TAKE these medications   cyclobenzaprine 10 MG tablet Commonly known as: FLEXERIL Take 1 tablet (10  mg total) by mouth 3 (three) times daily as needed for muscle spasms.   docusate sodium 100 MG capsule Commonly known as: COLACE Take 1 capsule (100 mg total) by mouth 2 (two) times daily.   furosemide 40 MG tablet Commonly known as: LASIX Take 1 tablet (40 mg total) by mouth daily.   gabapentin 300 MG capsule Commonly known as: NEURONTIN Take 1 capsule (300 mg total) by mouth 3 (three) times daily.   metoprolol succinate 50 MG 24 hr tablet Commonly known as: TOPROL-XL TAKE 1 TABLET EVERY EVENING WITH OR IMMEDIATELY FOLLOWING A MEAL What changed: See the new instructions.   multivitamin with minerals Tabs tablet Take 1 tablet by mouth  daily. Reported on 11/09/2015   Oxycodone HCl 10 MG Tabs Take 1 tablet (10 mg total) by mouth every 4 (four) hours as needed for severe pain ((score 7 to 10)).   potassium chloride SA 20 MEQ tablet Commonly known as: K-DUR Take 1 tablet (20 mEq total) by mouth as needed (with lasix). What changed: when to take this   warfarin 10 MG tablet Commonly known as: COUMADIN Take as directed. If you are unsure how to take this medication, talk to your nurse or doctor. Original instructions: TAKE 1/2 TO 1 TABLET DAILY AS DIRECTED BY COUMADIN CLINIC What changed: See the new instructions.        Signed: Ophelia Charter 05/21/2019, 11:40 AM

## 2019-05-21 NOTE — Plan of Care (Signed)
  Problem: Education: Goal: Required Educational Video(s) Outcome: Progressing   Problem: Clinical Measurements: Goal: Ability to maintain clinical measurements within normal limits will improve Outcome: Progressing Goal: Postoperative complications will be avoided or minimized Outcome: Progressing   Problem: Skin Integrity: Goal: Demonstration of wound healing without infection will improve Outcome: Progressing   Problem: Education: Goal: Knowledge of General Education information will improve Description: Including pain rating scale, medication(s)/side effects and non-pharmacologic comfort measures Outcome: Progressing   Problem: Health Behavior/Discharge Planning: Goal: Ability to manage health-related needs will improve Outcome: Progressing   Problem: Clinical Measurements: Goal: Ability to maintain clinical measurements within normal limits will improve Outcome: Progressing Goal: Will remain free from infection Outcome: Progressing Goal: Diagnostic test results will improve Outcome: Progressing Goal: Respiratory complications will improve Outcome: Progressing Goal: Cardiovascular complication will be avoided Outcome: Progressing   Problem: Activity: Goal: Risk for activity intolerance will decrease Outcome: Progressing   Problem: Nutrition: Goal: Adequate nutrition will be maintained Outcome: Progressing   Problem: Coping: Goal: Level of anxiety will decrease Outcome: Progressing   Problem: Elimination: Goal: Will not experience complications related to bowel motility Outcome: Progressing Goal: Will not experience complications related to urinary retention Outcome: Progressing   Problem: Pain Managment: Goal: General experience of comfort will improve Outcome: Progressing   Problem: Safety: Goal: Ability to remain free from injury will improve Outcome: Progressing   Problem: Skin Integrity: Goal: Risk for impaired skin integrity will  decrease Outcome: Progressing   Ival Bible, BSN, RN

## 2019-05-21 NOTE — Plan of Care (Signed)
  Problem: Activity: Goal: Risk for activity intolerance will decrease Outcome: Progressing   

## 2019-05-21 NOTE — Progress Notes (Signed)
PT Cancellation Note  Patient Details Name: Nicolas [name] MRN: [id number] DOB: 19-Aug-1966   Cancelled Treatment:     Patient reported he was to discharge to inpatient rehab at any time now.    Carney Living PT DPT  05/21/2019, 4:14 PM

## 2019-05-22 ENCOUNTER — Inpatient Hospital Stay (HOSPITAL_COMMUNITY): Payer: Medicare HMO | Admitting: Physical Therapy

## 2019-05-22 ENCOUNTER — Inpatient Hospital Stay (HOSPITAL_COMMUNITY): Payer: Medicare HMO | Admitting: Occupational Therapy

## 2019-05-22 DIAGNOSIS — K592 Neurogenic bowel, not elsewhere classified: Secondary | ICD-10-CM

## 2019-05-22 DIAGNOSIS — I2782 Chronic pulmonary embolism: Secondary | ICD-10-CM

## 2019-05-22 DIAGNOSIS — I10 Essential (primary) hypertension: Secondary | ICD-10-CM

## 2019-05-22 LAB — COMPREHENSIVE METABOLIC PANEL
ALT: 49 U/L — ABNORMAL HIGH (ref 0–44)
AST: 31 U/L (ref 15–41)
Albumin: 3.2 g/dL — ABNORMAL LOW (ref 3.5–5.0)
Alkaline Phosphatase: 75 U/L (ref 38–126)
Anion gap: 11 (ref 5–15)
BUN: 13 mg/dL (ref 6–20)
CO2: 28 mmol/L (ref 22–32)
Calcium: 8.9 mg/dL (ref 8.9–10.3)
Chloride: 95 mmol/L — ABNORMAL LOW (ref 98–111)
Creatinine, Ser: 0.9 mg/dL (ref 0.61–1.24)
GFR calc Af Amer: >60 mL/min (ref >60)
GFR calc non Af Amer: >60 mL/min (ref >60)
Glucose, Bld: 192 mg/dL — ABNORMAL HIGH (ref 70–99)
Potassium: 4.9 mmol/L (ref 3.5–5.1)
Sodium: 134 mmol/L — ABNORMAL LOW (ref 135–145)
Total Bilirubin: 0.9 mg/dL (ref 0.3–1.2)
Total Protein: 6.8 g/dL (ref 6.5–8.1)

## 2019-05-22 LAB — URINALYSIS, COMPLETE (UACMP) WITH MICROSCOPIC
Bacteria, UA: NONE SEEN
Bilirubin Urine: NEGATIVE
Glucose, UA: NEGATIVE mg/dL
Hgb urine dipstick: NEGATIVE
Ketones, ur: 20 mg/dL — AB
Leukocytes,Ua: NEGATIVE
Nitrite: NEGATIVE
Protein, ur: 30 mg/dL — AB
Specific Gravity, Urine: 1.027 (ref 1.005–1.030)
pH: 5 (ref 5.0–8.0)

## 2019-05-22 LAB — CBC WITH DIFFERENTIAL/PLATELET
Abs Immature Granulocytes: 0.07 10*3/uL (ref 0.00–0.07)
Basophils Absolute: 0.1 10*3/uL (ref 0.0–0.1)
Basophils Relative: 1 %
Eosinophils Absolute: 0.2 10*3/uL (ref 0.0–0.5)
Eosinophils Relative: 2 %
HCT: 45.5 % (ref 39.0–52.0)
Hemoglobin: 14.6 g/dL (ref 13.0–17.0)
Immature Granulocytes: 1 %
Lymphocytes Relative: 15 %
Lymphs Abs: 1.9 10*3/uL (ref 0.7–4.0)
MCH: 29.8 pg (ref 26.0–34.0)
MCHC: 32.1 g/dL (ref 30.0–36.0)
MCV: 92.9 fL (ref 80.0–100.0)
Monocytes Absolute: 1.9 10*3/uL — ABNORMAL HIGH (ref 0.1–1.0)
Monocytes Relative: 14 %
Neutro Abs: 8.7 10*3/uL — ABNORMAL HIGH (ref 1.7–7.7)
Neutrophils Relative %: 67 %
Platelets: 163 10*3/uL (ref 150–400)
RBC: 4.9 MIL/uL (ref 4.22–5.81)
RDW: 14.1 % (ref 11.5–15.5)
WBC: 12.9 10*3/uL — ABNORMAL HIGH (ref 4.0–10.5)
nRBC: 0 % (ref 0.0–0.2)

## 2019-05-22 LAB — PROTIME-INR
INR: 1.6 — ABNORMAL HIGH (ref 0.8–1.2)
Prothrombin Time: 18.6 seconds — ABNORMAL HIGH (ref 11.4–15.2)

## 2019-05-22 MED ORDER — WARFARIN SODIUM 7.5 MG PO TABS
7.5000 mg | ORAL_TABLET | Freq: Once | ORAL | Status: AC
  Administered 2019-05-22: 7.5 mg via ORAL
  Filled 2019-05-22: qty 1

## 2019-05-22 NOTE — Progress Notes (Signed)
ANTICOAGULATION CONSULT NOTE   Pharmacy Consult:  Coumadin Indication:  History of recurrent VTEs  Allergies  Allergen Reactions  . Ace Inhibitors Other (See Comments)    cough  . Lisinopril Other (See Comments)    cough   Patient Measurements: Height: 5\' 10"  (177.8 cm) Weight: (!) 300 lb 11.3 oz (136.4 kg) IBW/kg (Calculated) : 73  Vital Signs: Temp: 98.2 F (36.8 C) (06/26 0625) Temp Source: Oral (06/26 0625) BP: 129/77 (06/26 0636) Pulse Rate: 114 (06/26 0636)  Labs: Recent Labs    05/20/19 0513 05/21/19 0437 05/22/19 0659  HGB  --   --  14.6  HCT  --   --  45.5  PLT  --   --  163  LABPROT 14.4 16.0* 18.6*  INR 1.1 1.3* 1.6*  CREATININE  --   --  0.90   Estimated Creatinine Clearance: 133.6 mL/min (by C-G formula based on SCr of 0.9 mg/dL).  Medical History: Past Medical History:  Diagnosis Date  . Arthritis   . Clotting disorder ([place])    pulmonary embolus, DVT  . Depression   . Diabetes mellitus without complication (HCC)    no meds, watching diet  . DVT (deep venous thrombosis) ([place])    Summer 2012  . Hypertension   . Neuromuscular disorder ([place])    spinal stenosis.- pt walks with a walker short distance  . Peripheral vascular disease ([place])   . PONV (postoperative nausea and vomiting)   . Pulmonary embolism ([place])    x 3 in 2005, 2008, 2010  . Sleep apnea    does not wear a c-pap  . Spinal stenosis    Assessment: 28 YOM with progressive lower extremity weakness s/p laminectomies on 05/14/19.  Pharmacy consulted to resume Coumadin from PTA for history of recurrent VTEs. Admit INR 6/18 was 1.0 Home Coumadin dose: 5mg  daily except 10mg  on Tues/Thurs/Sun  Warfarin resumed 6/23 with 15mg  > 10mg  > 5mg   MD had changed 6/25 Warfarin to 10mg  but 5mg  order not discontinued > 5mg  given  Goal of Therapy:  INR 2-3 Monitor platelets by anticoagulation protocol: Yes   Plan:   Warfarin 7.5mg  today at 1800 Daily INR, s/s bleeding  Minda Ditto  PharmD Clinical Pharmacist [phone] Please check AMION for all [facility] numbers 05/22/2019 9:31 AM

## 2019-05-22 NOTE — Progress Notes (Signed)
Occupational Therapy Session Note  Patient Details  Name: Nicolas [name] MRN: [id number] Date of Birth: 21-Apr-1966  Today's Date: 05/22/2019 OT Individual Time: 1400-1500 OT Individual Time Calculation (min): 60 min    Short Term Goals: Week 1:  OT Short Term Goal 1 (Week 1): patient will complete bed mobility with CS OT Short Term Goal 2 (Week 1): patient will complete sit pivot transfers with CS OT Short Term Goal 3 (Week 1): patient will complete LB dressing with min A using ADs  Skilled Therapeutic Interventions/Progress Updates:  Treatment session with focus on bathing and dressing.  Pt received supine in bed reporting having small BM, but still feeling full and somewhat nauseous however wanting to engage in therapy session.  Pt agreeable to bathing and dressing at EOB.  Completed bed mobility with min-mod assist while demonstrating good awareness of back precautions.  Engaged in bathing seated EOB with setup for items.  Pt able to complete bathing with modified circle sit position, even threading BLE in to pants.  Required mod assist +2 for sit > stand with UE on RW to allow for pulling pants over hips.  Pt reports he would typically stand to pull pants over hips or could complete with lateral leans when not having a great day.  Pt reports having trapeze bar over bed to assist with mobility.  Pt would benefit from reaching for LB dressing; reports having a reacher, sock aid, and shoe horn at home.  Discussed bathroom transfers with recommendation for use of BSC over toilet to allow for elevated seat height as well as arm rests for leverage during sit <> stand.  Pt in agreement. Therapist obtained wide drop arm BSC and placed over toilet.  Pt returned to semi-reclined in bed and left with all needs in reach.  Therapy Documentation Precautions:  Precautions Precautions: Back, Fall Precaution Comments: back precautions reviewed Restrictions Weight Bearing Restrictions: No Pain: Pain  Assessment Pain Scale: 0-10 Pain Score: 6  Pain Type: Acute pain Pain Location: Abdomen Pain Orientation: Lower Pain Descriptors / Indicators: Pressure Pain Onset: Progressive Pain Intervention(s): Repositioned   Therapy/Group: Individual Therapy  Simonne Come 05/22/2019, 3:14 PM

## 2019-05-22 NOTE — Evaluation (Signed)
Physical Therapy Assessment and Plan  Patient Details  Name: Nicolas [name] MRN: [id number] Date of Birth: [birth year]  PT Diagnosis: Difficulty walking, Impaired sensation and Muscle weakness Rehab Potential: Excellent ELOS: 10-12 days   Today's Date: 05/22/2019 PT Individual Time: 0800-0905 PT Individual Time Calculation (min): 65 min    Problem List:  Patient Active Problem List   Diagnosis Date Noted  . Thoracic myelopathy 05/14/2019  . Cervical spondylosis with myelopathy 01/26/2019  . Class 3 severe obesity due to excess calories with serious comorbidity and body mass index (BMI) of 40.0 to 44.9 in adult ([place]) 10/10/2017  . Prediabetes 10/10/2017  . Debility 10/10/2017  . Able to mobilize using indoor motorized wheelchair 10/10/2017  . Frequent falls 02/01/2017  . At high risk for falls 03/12/2016  . Hypoxemia 05/23/2014  . Chronic anticoagulation 05/21/2014  . Spinal stenosis 12/02/2013  . Venous stasis dermatitis 09/26/2012  . Encounter for long-term (current) use of anticoagulants 03/26/2012  . Spinal stenosis of lumbar region 03/06/2012  . HTN (hypertension) 03/06/2012  . Hx pulmonary embolism 03/06/2012  . Lower extremity edema 03/06/2012    Past Medical History:  Past Medical History:  Diagnosis Date  . Arthritis   . Clotting disorder ([place])    pulmonary embolus, DVT  . Depression   . Diabetes mellitus without complication (HCC)    no meds, watching diet  . DVT (deep venous thrombosis) ([place])    Summer 2012  . Hypertension   . Neuromuscular disorder ([place])    spinal stenosis.- pt walks with a walker short distance  . Peripheral vascular disease ([place])   . PONV (postoperative nausea and vomiting)   . Pulmonary embolism ([place])    x 3 in 2005, 2008, 2010  . Sleep apnea    does not wear a c-pap  . Spinal stenosis    Past Surgical History:  Past Surgical History:  Procedure Laterality Date  . ANTERIOR CERVICAL CORPECTOMY N/A 01/26/2019   Procedure: Cervical  Four Corpectomy with Cervical Three-Four, Cervical Four-Five interbody fusion, prosthesis, explore old fusion, possible removal of old plate;  Surgeon: [name], MD;  Location: [facility];  Service: Neurosurgery;  Laterality: N/A;  anterior approach  . CHOLECYSTECTOMY N/A 05/24/2014   Procedure: LAPAROSCOPIC CHOLECYSTECTOMY;  Surgeon: Zenovia Jarred, MD;  Location: [facility];  Service: General;  Laterality: N/A;  . ERCP N/A 05/25/2014   Procedure: ENDOSCOPIC RETROGRADE CHOLANGIOPANCREATOGRAPHY (ERCP);  Surgeon: Beryle Beams, MD;  Location: [facility];  Service: Endoscopy;  Laterality: N/A;  . ERCP N/A 07/16/2014   Procedure: ENDOSCOPIC RETROGRADE CHOLANGIOPANCREATOGRAPHY (ERCP);  Surgeon: Beryle Beams, MD;  Location: [facility];  Service: Endoscopy;  Laterality: N/A;  . ivp filter  jan 2012  . LUMBAR LAMINECTOMY/DECOMPRESSION MICRODISCECTOMY N/A 05/14/2019   Procedure: LAMINECTOMY AND FORAMINOTOMY THORACIC FIVE- THORACIC SIX, THORACIC ELEVEN- THORACIC THORACIC, LUMBAR THREE- LUMBAR FOUR;  Surgeon: [name], MD;  Location: [facility];  Service: Neurosurgery;  Laterality: N/A;  posterior  . SPINE SURGERY  11/30/11   Total 6 back surgeries  . SPINE SURGERY  28/4132   Dr. Cathren Laine in [place], [place]  . [place]     08/2003, cervial spine 2005, 2010 lower back, 2011 lower back, 2012 & 2013  . UPPER GASTROINTESTINAL ENDOSCOPY    . WISDOM TOOTH EXTRACTION     2 removed    Assessment & Plan Clinical Impression:  Nicolas [name] is a 53 year old right-handed male history of hypertension, clotting disorder with history of PE/DVT on chronic Coumadin  followed by [facility] health heart care as well as cervical myelopathy with cervical disc surgery x2 and previous thoracic laminectomy. Per chart review patient lives with 34 year old son. He used a walker prior to admission. He does have a lift chair. 1 level home with ramped entrance. Son can assist as needed. He also has a daughter in the area that  works. Presented 05/14/2019 with progressive lower extremity weakness right greater than left. X-rays and imaging demonstrated thoracic and lumbar spinal stenosis with myelopathy. Underwent T5-6 laminectomy, T11-12 laminectomy through a separate incision, L3-4 laminectomy through a separate incision 05/14/2019 per Dr. Arnoldo Morale. Hospital course pain management. No back brace required. Chronic Coumadin has been resumed postoperatively.Therapy evaluations completed and patient was admitted for a comprehensive rehab program. Patient transferred to CIR on 05/21/2019 .   Patient currently requires mod with mobility secondary to muscle weakness, abnormal tone and unbalanced muscle activation and decreased sitting balance, decreased postural control and decreased balance strategies.  Prior to hospitalization, patient was modified independent  with mobility and lived with Son in a House home.  Home access is  Ramped entrance.  Patient will benefit from skilled PT intervention to maximize safe functional mobility, minimize fall risk and decrease caregiver burden for planned discharge home with intermittent assist.  Anticipate patient will benefit from follow up [facility] at discharge.  PT - End of Session Activity Tolerance: Tolerates 30+ min activity without fatigue Endurance Deficit: No Endurance Deficit Description: fatigue with unsupported sitting activities PT Assessment Rehab Potential (ACUTE/IP ONLY): Excellent PT Barriers to Discharge: Decreased caregiver support;Medical stability PT Plan PT Intensity: Minimum of 1-2 x/day ,45 to 90 minutes PT Frequency: 5 out of 7 days PT Duration Estimated Length of Stay: 10-12 days PT Treatment/Interventions: Ambulation/gait training;Balance/vestibular training;Community reintegration;Discharge planning;Disease management/prevention;DME/adaptive equipment instruction;Functional mobility training;Functional electrical stimulation;Neuromuscular re-education;Pain  management;Patient/family education;Psychosocial support;Splinting/orthotics;Therapeutic Activities;Therapeutic Exercise;UE/LE Strength taining/ROM;UE/LE Coordination activities;Wheelchair propulsion/positioning PT Transfers Anticipated Outcome(s): Supervision to min A overall PT Locomotion Anticipated Outcome(s): mod I at w/c level PT Recommendation Recommendations for Other Services: Neuropsych consult;Therapeutic Recreation consult Therapeutic Recreation Interventions: Stress management Follow Up Recommendations: Home health PT Patient destination: Home Equipment Recommended: To be determined Equipment Details: pt already owns majority of necessary equipment  Skilled Therapeutic Intervention Evaluation completed (see details above and below) with education on PT POC and goals and individual treatment initiated with focus on functional transfer assessment and orientation to purpose of rehab, schedule, etc. Pt received semi-reclined in bed, agreeable to PT session. See pain details below, pt reports he has not had a bowel movement since surgery and feels abdominal pain and pressure. Rolling L/R with mod A and use of bedrails to don brief and pants dependently. Supine to sitting EOB with mod A. Pt initially unsteady with sitting balance requiring BUE support to maintain sitting balance with close SBA. Setup A to change shirt while seated EOB. Slide board transfer bed to w/c with mod A. Pt is setup A to brush teeth while seated at sink. Pt's incisions along his back begin to drain once in seated position, RN notified and able to provide dressing for his incisions. Manual w/c propulsion x 150 ft with use of BUE. Pt agreeable to stay seated in w/c at end of session. Pt left seated in w/c in room with quick release belt and chair alarm in place, needs in reach.  PT Evaluation Precautions/Restrictions Precautions Precautions: Back;Fall Precaution Comments: revirewed BLT percautions   Restrictions Weight Bearing Restrictions: No Pain Pain Assessment Pain Scale: 0-10 Pain Score: 6  Pain Location: Abdomen Pain Orientation: Lower Pain Descriptors / Indicators: Pressure Pain Intervention(s): Repositioned Home Living/Prior Functioning Home Living Available Help at Discharge: Family;Available PRN/intermittently Type of Home: House Home Access: Ramped entrance Home Layout: One level Bathroom Shower/Tub: Chiropodist: Standard Additional Comments: alone during the day  Lives With: Son Prior Function Level of Independence: Independent with gait;Independent with transfers;Requires assistive device for independence  Able to Take Stairs?: No Driving: No Vocation: On disability Vision/Perception  Vision - History Baseline Vision: No visual deficits Vision - Assessment Eye Alignment: Within Functional Limits Ocular Range of Motion: Within Functional Limits Alignment/Gaze Preference: Within Defined Limits Tracking/Visual Pursuits: Able to track stimulus in all quads without difficulty Saccades: Within functional limits Convergence: Within functional limits Perception Perception: Within Functional Limits Praxis Praxis: Intact  Cognition Overall Cognitive Status: Within Functional Limits for tasks assessed Arousal/Alertness: Awake/alert Orientation Level: Oriented X4 Attention: Focused Focused Attention: Appears intact Memory: Appears intact Awareness: Appears intact Problem Solving: Appears intact Safety/Judgment: Appears intact Sensation Sensation Light Touch: Impaired Detail Light Touch Impaired Details: Impaired RLE;Impaired LLE(proximal > distal) Proprioception: Impaired Detail Proprioception Impaired Details: Impaired RLE;Impaired LLE Additional Comments: UE sensation intact Coordination Gross Motor Movements are Fluid and Coordinated: No Fine Motor Movements are Fluid and Coordinated: Yes Coordination and Movement Description:  impaired 2/2 quadraparesis Finger Nose Finger Test: WNL Motor  Motor Motor: Abnormal tone;Other (comment)(quadraparesis) Motor - Skilled Clinical Observations: impaired 2/2 quadraparesis LE>UE  Mobility Bed Mobility Bed Mobility: Rolling Right;Rolling Left;Supine to Sit;Sit to Supine Rolling Right: Minimal Assistance - Patient > 75% Rolling Left: Minimal Assistance - Patient > 75% Right Sidelying to Sit: Moderate Assistance - Patient 50-74% Supine to Sit: Moderate Assistance - Patient 50-74% Sit to Supine: Moderate Assistance - Patient 50-74% Sit to Sidelying Right: Moderate Assistance - Patient 50-74% Transfers Transfers: Lateral/Scoot Transfers Lateral/Scoot Transfers: Moderate Assistance - Patient 50-74% Transfer (Assistive device): Other (Comment)(slide board) Artist / Additional Locomotion Stairs: No Architect: Yes Wheelchair Assistance: Chartered loss adjuster: Both upper extremities Wheelchair Parts Management: Needs assistance  Trunk/Postural Assessment  Cervical Assessment Cervical Assessment: Exceptions to WFL(forward head) Thoracic Assessment Thoracic Assessment: Exceptions to WFL(rounded shoulders; back precautions) Lumbar Assessment Lumbar Assessment: Exceptions to WFL(posterior pelvic tilt; back precautions) Postural Control Postural Control: Deficits on evaluation Trunk Control: impaired, UE support for sitting balance Righting Reactions: delayed Protective Responses: impaired Postural Limitations: impaired  Balance Balance Balance Assessed: Yes Static Sitting Balance Static Sitting - Balance Support: Bilateral upper extremity supported;Feet supported Static Sitting - Level of Assistance: 5: Stand by assistance Dynamic Sitting Balance Dynamic Sitting - Balance Support: No upper extremity supported;Feet supported;During functional activity Dynamic Sitting - Level of Assistance: 3: Mod  assist Extremity Assessment LUE Assessment LUE Assessment: Within Functional Limits General Strength Comments: 5/5 RLE Assessment RLE Assessment: Exceptions to [facility] Passive Range of Motion (PROM) Comments: WFL General Strength Comments: impaired, see below RLE Strength Right Hip Flexion: 2/5 Right Knee Flexion: 3+/5 Right Knee Extension: 3+/5 Right Ankle Dorsiflexion: 1/5 LLE Assessment LLE Assessment: Exceptions to [facility] Passive Range of Motion (PROM) Comments: WFL General Strength Comments: impaired, see below LLE Strength Left Hip Flexion: 4/5 Left Knee Flexion: 4/5 Left Knee Extension: 4/5 Left Ankle Dorsiflexion: 3/5    Refer to Care Plan for Long Term Goals  Recommendations for other services: Neuropsych and Therapeutic Recreation  Stress management  Discharge Criteria: Patient will be discharged from PT if patient refuses treatment 3 consecutive times without medical reason, if treatment goals not  met, if there is a change in medical status, if patient makes no progress towards goals or if patient is discharged from hospital.  The above assessment, treatment plan, treatment alternatives and goals were discussed and mutually agreed upon: by patient   Excell Seltzer, PT, DPT 05/22/2019, 3:24 PM

## 2019-05-22 NOTE — Evaluation (Signed)
Occupational Therapy Assessment and Plan  Patient Details  Name: AJ CRUNKLETON MRN: [id number] Date of Birth: 1966-10-26  OT Diagnosis: lumbago (low back pain), muscle weakness (generalized) and paraplegia at level T5 Rehab Potential: Rehab Potential (ACUTE ONLY): Good ELOS: 10-12 days   Today's Date: 05/22/2019 OT Individual Time: 1100-1200 OT Individual Time Calculation (min): 60 min     Problem List:  Patient Active Problem List   Diagnosis Date Noted  . Thoracic myelopathy 05/14/2019  . Cervical spondylosis with myelopathy 01/26/2019  . Class 3 severe obesity due to excess calories with serious comorbidity and body mass index (BMI) of 40.0 to 44.9 in adult ([place]) 10/10/2017  . Prediabetes 10/10/2017  . Debility 10/10/2017  . Able to mobilize using indoor motorized wheelchair 10/10/2017  . Frequent falls 02/01/2017  . At high risk for falls 03/12/2016  . Hypoxemia 05/23/2014  . Chronic anticoagulation 05/21/2014  . Spinal stenosis 12/02/2013  . Venous stasis dermatitis 09/26/2012  . Encounter for long-term (current) use of anticoagulants 03/26/2012  . Spinal stenosis of lumbar region 03/06/2012  . HTN (hypertension) 03/06/2012  . Hx pulmonary embolism 03/06/2012  . Lower extremity edema 03/06/2012    Past Medical History:  Past Medical History:  Diagnosis Date  . Arthritis   . Clotting disorder ([place])    pulmonary embolus, DVT  . Depression   . Diabetes mellitus without complication (HCC)    no meds, watching diet  . DVT (deep venous thrombosis) ([place])    Summer 2012  . Hypertension   . Neuromuscular disorder ([place])    spinal stenosis.- pt walks with a walker short distance  . Peripheral vascular disease ([place])   . PONV (postoperative nausea and vomiting)   . Pulmonary embolism ([place])    x 3 in 2005, 2008, 2010  . Sleep apnea    does not wear a c-pap  . Spinal stenosis    Past Surgical History:  Past Surgical History:  Procedure Laterality Date  . ANTERIOR  CERVICAL CORPECTOMY N/A 01/26/2019   Procedure: Cervical Four Corpectomy with Cervical Three-Four, Cervical Four-Five interbody fusion, prosthesis, explore old fusion, possible removal of old plate;  Surgeon: [name], MD;  Location: [facility];  Service: Neurosurgery;  Laterality: N/A;  anterior approach  . CHOLECYSTECTOMY N/A 05/24/2014   Procedure: LAPAROSCOPIC CHOLECYSTECTOMY;  Surgeon: Zenovia Jarred, MD;  Location: [facility];  Service: General;  Laterality: N/A;  . ERCP N/A 05/25/2014   Procedure: ENDOSCOPIC RETROGRADE CHOLANGIOPANCREATOGRAPHY (ERCP);  Surgeon: Beryle Beams, MD;  Location: [facility];  Service: Endoscopy;  Laterality: N/A;  . ERCP N/A 07/16/2014   Procedure: ENDOSCOPIC RETROGRADE CHOLANGIOPANCREATOGRAPHY (ERCP);  Surgeon: Beryle Beams, MD;  Location: [facility];  Service: Endoscopy;  Laterality: N/A;  . ivp filter  jan 2012  . LUMBAR LAMINECTOMY/DECOMPRESSION MICRODISCECTOMY N/A 05/14/2019   Procedure: LAMINECTOMY AND FORAMINOTOMY THORACIC FIVE- THORACIC SIX, THORACIC ELEVEN- THORACIC THORACIC, LUMBAR THREE- LUMBAR FOUR;  Surgeon: [name], MD;  Location: [facility];  Service: Neurosurgery;  Laterality: N/A;  posterior  . SPINE SURGERY  11/30/11   Total 6 back surgeries  . SPINE SURGERY  97/3532   Dr. Cathren Laine in [place], [place]  . [place]     08/2003, cervial spine 2005, 2010 lower back, 2011 lower back, 2012 & 2013  . UPPER GASTROINTESTINAL ENDOSCOPY    . WISDOM TOOTH EXTRACTION     2 removed    Assessment & Plan Clinical Impression: Patient is a 53 y.o. year old male with history of  hypertension, clotting disorder with history of PE/DVT on chronic Coumadin followed by [facility] as well as cervical myelopathy with cervical disc surgery x2 and previous thoracic laminectomy. Per chart review patient lives with 44 year old son. He used a walker prior to admission. He does have a lift chair. 1 level home with ramped entrance. Son can assist as  needed. He also has a daughter in the area that works. Presented 05/14/2019 with progressive lower extremity weakness right greater than left. X-rays and imaging demonstrated thoracic and lumbar spinal stenosis with myelopathy. Underwent T5-6 laminectomy, T11-12 laminectomy through a separate incision, L3-4 laminectomy through a separate incision 05/14/2019 per Dr. Arnoldo Morale. Hospital course pain management. No back brace required. Chronic Coumadin has been resumed postoperatively.  Patient transferred to CIR on 05/21/2019 .    Patient currently requires mod with basic self-care skills secondary to muscle weakness, muscle joint tightness and muscle paralysis and decreased sitting balance, decreased standing balance, decreased postural control and decreased balance strategies.  Prior to hospitalization, patient could complete adl with modified independent .  Patient will benefit from skilled intervention to decrease level of assist with basic self-care skills and increase independence with basic self-care skills prior to discharge home with care partner.  Anticipate patient will require intermittent supervision and minimal physical assistance and follow up home health.  OT - End of Session Activity Tolerance: Tolerates 10 - 20 min activity with multiple rests Endurance Deficit: Yes Endurance Deficit Description: fatigue with unsupported sitting activities OT Assessment Rehab Potential (ACUTE ONLY): Good OT Patient demonstrates impairments in the following area(s): Balance;Endurance;Motor;Pain OT Basic ADL's Functional Problem(s): Grooming;Bathing;Dressing;Toileting OT Advanced ADL's Functional Problem(s): Simple Meal Preparation;Laundry;Light Housekeeping OT Transfers Functional Problem(s): Toilet;Tub/Shower OT Additional Impairment(s): None OT Plan OT Intensity: Minimum of 1-2 x/day, 45 to 90 minutes OT Frequency: 5 out of 7 days OT Duration/Estimated Length of Stay: 10-12 days OT  Treatment/Interventions: Balance/vestibular training;Self Care/advanced ADL retraining;Therapeutic Exercise;DME/adaptive equipment instruction;Pain management;Community reintegration;Patient/family education;Discharge planning;Functional mobility training;Therapeutic Activities OT Self Feeding Anticipated Outcome(s): independent OT Basic Self-Care Anticipated Outcome(s): set up/min A OT Toileting Anticipated Outcome(s): set up/min A OT Bathroom Transfers Anticipated Outcome(s): set up/min A OT Recommendation Patient destination: Home Follow Up Recommendations: Home health OT Equipment Recommended: 3 in 1 bedside comode   Skilled Therapeutic Intervention Patient in bed.  He is pleasant and cooperative.  C/o nausea and dizziness at times during session.  He reports that he has been unable to move his bowels since last week.  OT evaluation completed as documented below.  He was unable to tolerate transfer OOB due to nausea but was able to sit edge of bed for mobility and conditioning activities for 20 minutes with CS.  Reviewed role of OT, pain mgmt, DME, scheduling, plan of care and goals for therapy.  Completed UB and LB stretching and ROM activities in supine position in bed with moderate difficulty.  He is resting in bed at close of session with call bell in reach and bed alarm set.    OT Evaluation Precautions/Restrictions  Precautions Precautions: Back;Fall Precaution Comments: back precautions reviewed Restrictions Weight Bearing Restrictions: No General Chart Reviewed: Yes Vital Signs  Pain Pain Assessment Pain Scale: 0-10 Pain Score: 6  Pain Type: Acute pain Pain Location: Abdomen Pain Orientation: Lower Pain Descriptors / Indicators: Pressure Pain Frequency: Constant Pain Onset: Progressive Pain Intervention(s): Repositioned Home Living/Prior Functioning Home Living Family/patient expects to be discharged to:: Private residence Living Arrangements: Children Available  Help at Discharge: Family, Available PRN/intermittently Type  of Home: House Home Access: Ramped entrance Home Layout: One level Bathroom Shower/Tub: Chiropodist: Standard Additional Comments: alone during the day  Lives With: Son Prior Function Level of Independence: Independent with gait, Independent with transfers, Requires assistive device for independence  Able to Take Stairs?: No Driving: No Vocation: On disability ADL ADL Eating: Independent Where Assessed-Eating: Bed level Grooming: Setup Where Assessed-Grooming: Bed level Upper Body Dressing: Setup Where Assessed-Upper Body Dressing: Edge of bed Lower Body Dressing: Maximal assistance Where Assessed-Lower Body Dressing: Bed level ADL Comments: dizziness and nausea limited ability to complete activities during am session Vision Baseline Vision/History: No visual deficits Patient Visual Report: No change from baseline Vision Assessment?: Yes Eye Alignment: Within Functional Limits Ocular Range of Motion: Within Functional Limits Alignment/Gaze Preference: Within Defined Limits Tracking/Visual Pursuits: Able to track stimulus in all quads without difficulty Saccades: Within functional limits Convergence: Within functional limits Visual Fields: No apparent deficits Perception  Perception: Within Functional Limits Praxis Praxis: Intact Cognition Overall Cognitive Status: Within Functional Limits for tasks assessed Arousal/Alertness: Awake/alert Orientation Level: Person;Place;Situation Person: Oriented Place: Oriented Situation: Oriented Year: 2020 Month: June Day of Week: Correct Memory: Appears intact Immediate Memory Recall: Sock;Blue;Bed Memory Recall Sock: Without Cue Memory Recall Blue: Without Cue Memory Recall Bed: Without Cue Attention: Focused Focused Attention: Appears intact Awareness: Appears intact Problem Solving: Appears intact Safety/Judgment: Appears  intact Sensation Sensation Light Touch: Impaired Detail Light Touch Impaired Details: Impaired RLE;Impaired LLE(proximal > distal) Proprioception: Impaired Detail Proprioception Impaired Details: Impaired RLE;Impaired LLE Additional Comments: UE sensation intact Coordination Gross Motor Movements are Fluid and Coordinated: No Fine Motor Movements are Fluid and Coordinated: Yes Coordination and Movement Description: impaired 2/2 quadraparesis Finger Nose Finger Test: WNL Motor  Motor Motor: Abnormal tone;Other (comment)(quadraparesis) Motor - Skilled Clinical Observations: weakness bilateral LEs Mobility  Bed Mobility Bed Mobility: Rolling Right;Rolling Left;Right Sidelying to Sit;Sit to Sidelying Right Rolling Right: Independent with assistive device Rolling Left: Independent with assistive device Right Sidelying to Sit: Moderate Assistance - Patient 50-74% Supine to Sit: Moderate Assistance - Patient 50-74% Sit to Supine: Moderate Assistance - Patient 50-74% Sit to Sidelying Right: Moderate Assistance - Patient 50-74%  Trunk/Postural Assessment  Cervical Assessment Cervical Assessment: Exceptions to WFL(forward head) Thoracic Assessment Thoracic Assessment: Exceptions to WFL(rounded shoulders; back precautions) Lumbar Assessment Lumbar Assessment: Exceptions to WFL(posterior pelvic tilt; back precautions) Postural Control Postural Control: Deficits on evaluation Trunk Control: impaired, UE support for sitting balance Righting Reactions: delayed Protective Responses: impaired Postural Limitations: impaired  Balance Balance Balance Assessed: Yes Static Sitting Balance Static Sitting - Balance Support: Feet supported Static Sitting - Level of Assistance: 5: Stand by assistance Dynamic Sitting Balance Dynamic Sitting - Balance Support: Feet unsupported Dynamic Sitting - Level of Assistance: 4: Min assist Extremity/Trunk Assessment RUE Assessment RUE Assessment: Within  Functional Limits General Strength Comments: 5/5 LUE Assessment LUE Assessment: Within Functional Limits General Strength Comments: 5/5     Refer to Care Plan for Long Term Goals  Recommendations for other services: None    Discharge Criteria: Patient will be discharged from OT if patient refuses treatment 3 consecutive times without medical reason, if treatment goals not met, if there is a change in medical status, if patient makes no progress towards goals or if patient is discharged from hospital.  The above assessment, treatment plan, treatment alternatives and goals were discussed and mutually agreed upon: by patient  Carlos Levering 05/22/2019, 12:29 PM

## 2019-05-22 NOTE — Progress Notes (Signed)
Patient information reviewed and entered into [facility] System by Becky Mileah Hemmer, PPS coordinator. Information including medical coding, function ability, and quality indicators will be reviewed and updated through discharge.   

## 2019-05-22 NOTE — Progress Notes (Signed)
Patient slept well throughout the night. Medicated x2 with oxycodone 10 mg po for c/o back pain and bilateral groin pain-effective. Pt given dulcolax 5 mg suppository this am for no bm since 6/18 per report.

## 2019-05-22 NOTE — Plan of Care (Signed)
  Problem: Consults Goal: RH SPINAL CORD INJURY PATIENT EDUCATION Description:  See Patient Education module for education specifics.  Outcome: Progressing   Problem: RH SKIN INTEGRITY Goal: RH STG MAINTAIN SKIN INTEGRITY WITH ASSISTANCE Description: STG Maintain Skin Integrity With mod Assistance. Outcome: Progressing   Problem: RH SAFETY Goal: RH STG ADHERE TO SAFETY PRECAUTIONS W/ASSISTANCE/DEVICE Description: STG Adhere to Safety Precautions With mod Assistance and appropriate assistive Device. Outcome: Progressing   Problem: RH PAIN MANAGEMENT Goal: RH STG PAIN MANAGED AT OR BELOW PT'S PAIN GOAL Description: <3 on a 0-10 pain scale. Outcome: Not Progressing   Problem: RH KNOWLEDGE DEFICIT SCI Goal: RH STG INCREASE KNOWLEDGE OF SELF CARE AFTER SCI Outcome: Progressing

## 2019-05-22 NOTE — Progress Notes (Addendum)
[facility] PHYSICAL MEDICINE & REHABILITATION PROGRESS NOTE   Subjective/Complaints: Had a good night. Pain under fair control. Emptied bowels.   ROS: Patient denies fever, rash, sore throat, blurred vision, nausea, vomiting, diarrhea, cough, shortness of breath or chest pain, joint or back pain, headache, or mood change.    Objective:   No results found. Recent Labs    05/22/19 0659  WBC 12.9*  HGB 14.6  HCT 45.5  PLT 163   No results for input(s): NA, K, CL, CO2, GLUCOSE, BUN, CREATININE, CALCIUM in the last 72 hours.  Intake/Output Summary (Last 24 hours) at 05/22/2019 0833 Last data filed at 05/22/2019 2841 Gross per 24 hour  Intake 120 ml  Output 300 ml  Net -180 ml     Physical Exam: Vital Signs Blood pressure 129/77, pulse (!) 114, temperature 98.2 F (36.8 C), temperature source Oral, resp. rate 20, height 5\' 10"  (1.778 m), weight (!) 136.4 kg, SpO2 96 %.  Neurological: Constitutional: No distress . Vital signs reviewed. obese HEENT: EOMI, oral membranes moist Neck: supple Cardiovascular: tachy without murmur. No JVD    Respiratory: CTA Bilaterally without wheezes or rales. Normal effort    GI: BS +, non-tender, non-distended  Extremities: No clubbing, cyanosis, or edema Skin: lower back incision with sl separation, steristrips gone Neurologic: Cranial nerves II through XII intact, motor strength is 5/5 in bilateral deltoid, bicep, tricep, grip,3. RLE: 2/5 HF, KE, APF, 1/5 APF. LLE 3/5. Decreased LT, pain R>L LE below mid abdomen.  Musculoskeletal: Full range of motion in all 4 extremities. No joint swelling Psych: pleasant   Assessment/Plan: 1. Functional deficits secondary to thoracic myelopathy which require 3+ hours per day of interdisciplinary therapy in a comprehensive inpatient rehab setting.  Physiatrist is providing close team supervision and 24 hour management of active medical problems listed below.  Physiatrist and rehab team continue to  assess barriers to discharge/monitor patient progress toward functional and medical goals  Care Tool:  Bathing              Bathing assist       Upper Body Dressing/Undressing Upper body dressing   What is the patient wearing?: Pull over shirt    Upper body assist Assist Level: Minimal Assistance - Patient > 75%    Lower Body Dressing/Undressing Lower body dressing      What is the patient wearing?: Underwear/pull up     Lower body assist Assist for lower body dressing: Minimal Assistance - Patient > 75%     Toileting Toileting    Toileting assist Assist for toileting: Minimal Assistance - Patient > 75%     Transfers Chair/bed transfer  Transfers assist  Chair/bed transfer activity did not occur: N/A        Locomotion Ambulation   Ambulation assist              Walk 10 feet activity   Assist           Walk 50 feet activity   Assist           Walk 150 feet activity   Assist           Walk 10 feet on uneven surface  activity   Assist           Wheelchair     Assist               Wheelchair 50 feet with 2 turns activity    Assist  Wheelchair 150 feet activity     Assist          Medical Problem List and Plan:  1.Progressive quadriparesissecondary to thoracic lumbar myelopathy status post T5-6 laminectomy, T11-12 laminectomy, L3-4 laminectomy 05/14/2019 as well as history of prior cervical discectomy and thoracic laminectomy in the past. No brace required  -Patient is beginning CIR therapies today including PT and OT  2. Antithrombotics: -DVT/anticoagulation:SCDs.Chronic Coumadin resumed -antiplatelet therapy: N/A 3. Pain Management:Neurontin 300 mg 3 times daily, Flexeril and oxycodone as needed 4. Mood:Provide emotional support -antipsychotic agents: N/A 5. Neuropsych: This patientiscapable of making decisions on hisown behalf. 6.  Skin/Wound Care:Routine skin checks 7. Fluids/Electrolytes/Nutrition:encourage PO  -I personally reviewed the patient's labs today.   8.History of clotting disorder pulmonary emboli DVT. Discussed with neurosurgery re: resumption of chronic Coumadin.   9.Hypertension. Toprol-XL 50 mg daily, Lasix 40 mg daily. 10. Constipation/neurogenic bowel:. moved bowels yesterday  -regular bowel program 11. Low grade temp, leukocytosis:  -check UA, UCX  -IS  -check PVR's    LOS: 1 days A FACE TO FACE EVALUATION WAS PERFORMED  Meredith Staggers 05/22/2019, 8:33 AM

## 2019-05-23 ENCOUNTER — Inpatient Hospital Stay (HOSPITAL_COMMUNITY): Payer: Medicare HMO | Admitting: Physical Therapy

## 2019-05-23 ENCOUNTER — Inpatient Hospital Stay (HOSPITAL_COMMUNITY): Payer: Medicare HMO | Admitting: Occupational Therapy

## 2019-05-23 LAB — PROTIME-INR
INR: 1.7 — ABNORMAL HIGH (ref 0.8–1.2)
Prothrombin Time: 19.5 seconds — ABNORMAL HIGH (ref 11.4–15.2)

## 2019-05-23 LAB — URINE CULTURE

## 2019-05-23 MED ORDER — WARFARIN SODIUM 5 MG PO TABS
10.0000 mg | ORAL_TABLET | Freq: Once | ORAL | Status: AC
  Administered 2019-05-23: 10 mg via ORAL
  Filled 2019-05-23: qty 2

## 2019-05-23 NOTE — Progress Notes (Signed)
Occupational Therapy Session Note  Patient Details  Name: Nicolas [name] MRN: [id number] Date of Birth: 11/02/1966  Today's Date: 05/23/2019 OT Individual Time: 7867-5449 OT Individual Time Calculation (min): 46 min    Short Term Goals: Week 1:  OT Short Term Goal 1 (Week 1): patient will complete bed mobility with CS OT Short Term Goal 2 (Week 1): patient will complete sit pivot transfers with CS OT Short Term Goal 3 (Week 1): patient will complete LB dressing with min A using ADs  Skilled Therapeutic Interventions/Progress Updates:    Pt worked on shower during session today.  Max assist for sit to stand from the wheelchair with use of the Stedy to transfer into the shower.  Max assist for removal of LB clothing, shoes, and TEDs secondary to not having reacher available.  Therapist applied, shower guard coverings to his back incisions prior to shower as well.  Pt completed all bathing in sitting with use of the LH sponge for washing his lower legs and feet, all with supervision.  Therapist assisted with washing buttocks once pt was transferred out of the shower with the [facility] and at bedside.  Pt reports at home he uses a toilet aide to assist with washing his buttocks as well for toileting tasks.  He states that he will have his son bring this in when he is back in town soon.  Pt wanted to donn hospital gown and return to bed after completion of shower secondary to not having any further therapy.  Mod assist to transition to supine from sitting.  Pt left with call button and phone in reach with bed alarm in place.    Therapy Documentation Precautions:  Precautions Precautions: Fall, Back Precaution Comments: revirewed BLT percautions  Restrictions Weight Bearing Restrictions: No  Pain: Pain Assessment Pain Scale: Faces Pain Score: Asleep Faces Pain Scale: Hurts a little bit Pain Type: Surgical pain Pain Location: Back Pain Orientation: Mid Pain Descriptors / Indicators:  Discomfort Pain Frequency: Constant Pain Onset: With Activity Pain Intervention(s): Repositioned;Emotional support ADL: See Care Tool Section for some details of ADL  Therapy/Group: Individual Therapy  Jiovanny Burdell OTR/L 05/23/2019, 4:07 PM

## 2019-05-23 NOTE — Progress Notes (Signed)
Physical Therapy Session Note  Patient Details  Name: Nicolas [name] MRN: [id number] Date of Birth: 01-30-1966  Today's Date: 05/23/2019 PT Individual Time: 2774-1287 PT Individual Time Calculation (min): 80 min   Short Term Goals: Week 1:  PT Short Term Goal 1 (Week 1): Pt will complete least restrictive transfer with min A PT Short Term Goal 2 (Week 1): Pt will initiate standing PT Short Term Goal 3 (Week 1): Pt will complete bed mobility with min A  Skilled Therapeutic Interventions/Progress Updates:    Pt received sitting in recliner and agreeable to therapy session. Sit<>stand recliner/w/c<>RW with mod/max assist for lifting with pt demonstrating heavy reliance on B UE support. Stand pivot transfer recliner>w/c using RW with mod assist for balance and pt relying significantly on B UE support via RW to maintain upright - demonstrates significant difficulty lifting LEs to step/reposition while turning. Transported to/from gym in w/c. Stand pivot transfer w/c>EOM using RW with min/mod assist for balance. Performed 3x sit<>stand EOM<>RW with mirror feedback and cuing for increased use of B LEs and decreased reliance on B UE support; however, pt continues to demonstrate significant difficulty due to B LE paresis. Sit>supine with min assist for B LE management.  Performed the following B LE exercises focusing on strength and NMR:  - 3x10 Supine hip flexion with assist for lifting/lowering and cuing for breathing - supine hip flexor stretch 2x32min each LE - pt educated on importance of lying gradually more flat in bed to perform passive hip flexor stretch outside of therapy sessions - supine short arc quads 3x10 reps each LE cuing for full knee extension, including active ankle DF as well as improved eccentric control - supine bridging 2x10 with manual facilitation for increased B LE weightbearing pt denies LBP during this exercise but towards end reports increasing tightness in low back Patient  demonstrates significantly decreased B LE strength with increased difficulty performing all of the above exercises.  Supine>sit with mod assist for trunk upright. Stand pivot transfer EOM>w/c using RW with mod/max assist for lifting and mod assist for balance while turning. B UE w/c propulsion ~57ft with supervision. Transported dependently remainder of distance for time management. Sit<>stand in standing frame. Performed mini-squats in standing frame with harness loosened for increased B LE muscle activation. Pt requesting to return to room to urinate. Transported back in w/c and pt continent of bladder in urinal. Pt left sitting in w/c with needs in reach and seat belt alarm on.   Therapy Documentation Precautions:  Precautions Precautions: Back, Fall Precaution Comments: revirewed BLT percautions  Restrictions Weight Bearing Restrictions: No  Pain: Denies pain during session.   Therapy/Group: Individual Therapy  Tawana Scale, PT, DPT 05/23/2019, 7:57 AM

## 2019-05-23 NOTE — Progress Notes (Signed)
Physical Therapy Session Note  Patient Details  Name: Nicolas [name] MRN: [id number] Date of Birth: 1966-11-26  Today's Date: 05/23/2019 PT Individual Time: 0807-0900 PT Individual Time Calculation (min): 53 min   Short Term Goals: Week 1:  PT Short Term Goal 1 (Week 1): Pt will complete least restrictive transfer with min A PT Short Term Goal 2 (Week 1): Pt will initiate standing PT Short Term Goal 3 (Week 1): Pt will complete bed mobility with min A  Skilled Therapeutic Interventions/Progress Updates:   Pt received in bed with RN present to give medication and pain medication.  Pt asking if he can shower; will have RN discuss with MD.  Pt willing to participate.  Donned bilat TED hose while pt receiving medication.  Pt noted to be hypersensitive to touch on R upper thigh.  With bed rails and HOB elevated pt performed supine > sit with rolling MOD I.  Assessed vitals in supine and then again sitting EOB due to pt reporting lightheadedness - pt's BP noted to increase sitting EOB.    From EOB pt performed 3 reps sit > stand from elevated bed with min-mod A from therapist to stabilize R knee and assist with trunk control as pt tends to pitch forwards into flexion.  On third stand performed alternating heel lifts and stepping forwards and back to assess stability of LE.  Pt performed stand pivot from bed > w/c with Mod A from therapist to stabilize trunk, R knee and to provide verbal cues for sequencing of pivoting with RW and when pt was in position to sit.    While pt performed oral hygiene at sink therapist rearranged room.  Performed stand pivot from w/c > recliner with max A to stand from lower w/c and mod A to pivot once standing.  Performed one more stand from recliner with max A to place cushion in recliner to bring pt slightly higher.  Pt set up with all items within reach and seat belt alarm set.    Therapy Documentation Precautions:  Precautions Precautions: Back, Fall Precaution  Comments: revirewed BLT percautions  Restrictions Weight Bearing Restrictions: No Pain: Pain Assessment Pain Scale: 0-10 Pain Score: 5  Pain Type: Surgical pain Pain Location: Back Pain Orientation: Lower Pain Descriptors / Indicators: Aching Pain Onset: Progressive Pain Intervention(s): Emotional support   Therapy/Group: Individual Therapy   Rico Junker, PT, DPT 05/23/19    12:22 PM     05/23/2019, 12:21 PM

## 2019-05-23 NOTE — Progress Notes (Signed)
ANTICOAGULATION CONSULT NOTE   Pharmacy Consult:  Coumadin Indication:  History of recurrent VTEs  Allergies  Allergen Reactions  . Ace Inhibitors Other (See Comments)    cough  . Lisinopril Other (See Comments)    cough   Patient Measurements: Height: 5\' 10"  (177.8 cm) Weight: (!) 300 lb 11.3 oz (136.4 kg) IBW/kg (Calculated) : 73  Vital Signs: Temp: 98 F (36.7 C) (06/27 0316) Temp Source: Oral (06/27 0316) BP: 119/68 (06/27 0316) Pulse Rate: 105 (06/27 0316)  Labs: Recent Labs    05/21/19 0437 05/22/19 0659 05/23/19 0538  HGB  --  14.6  --   HCT  --  45.5  --   PLT  --  163  --   LABPROT 16.0* 18.6* 19.5*  INR 1.3* 1.6* 1.7*  CREATININE  --  0.90  --    Estimated Creatinine Clearance: 133.6 mL/min (by C-G formula based on SCr of 0.9 mg/dL).  Medical History: Past Medical History:  Diagnosis Date  . Arthritis   . Clotting disorder ([place])    pulmonary embolus, DVT  . Depression   . Diabetes mellitus without complication (HCC)    no meds, watching diet  . DVT (deep venous thrombosis) ([place])    Summer 2012  . Hypertension   . Neuromuscular disorder ([place])    spinal stenosis.- pt walks with a walker short distance  . Peripheral vascular disease ([place])   . PONV (postoperative nausea and vomiting)   . Pulmonary embolism ([place])    x 3 in 2005, 2008, 2010  . Sleep apnea    does not wear a c-pap  . Spinal stenosis    Assessment: 75 YOM with progressive lower extremity weakness s/p laminectomies on 05/14/19.  Pharmacy consulted to resume Coumadin from PTA for history of recurrent VTEs. Admit INR 6/18 was 1.0 Home Coumadin dose: 5mg  daily except 10mg  on Tues/Thurs/Sun  Warfarin resumed 6/23 with 15mg  > 10mg  > 5mg  > 7.5mg  MD had changed 6/25 Warfarin to 10mg  but 5mg  order not discontinued > 5mg  given  Goal of Therapy:  INR 2-3 Monitor platelets by anticoagulation protocol: Yes   Today, 05/23/2019 INR 1.7 this am, no bridging added yet, INR increasing 6/26 CBC:  Hgb 14.6, Plt 163 No s/s bleed   Plan:   Warfarin 10mg  today at 1800 Daily INR, monitor for s/s bleeding  Minda Ditto PharmD Clinical Pharmacist [phone] Please check AMION for all [place] numbers 05/23/2019 9:15 AM

## 2019-05-23 NOTE — Progress Notes (Signed)
[facility] PHYSICAL MEDICINE & REHABILITATION PROGRESS NOTE   Subjective/Complaints: No new issues. Had a good day with therapies. Pain controlled  ROS: Patient denies fever, rash, sore throat, blurred vision, nausea, vomiting, diarrhea, cough, shortness of breath or chest pain, joint or back pain, headache, or mood change.   Objective:   No results found. Recent Labs    05/22/19 0659  WBC 12.9*  HGB 14.6  HCT 45.5  PLT 163   Recent Labs    05/22/19 0659  NA 134*  K 4.9  CL 95*  CO2 28  GLUCOSE 192*  BUN 13  CREATININE 0.90  CALCIUM 8.9    Intake/Output Summary (Last 24 hours) at 05/23/2019 1001 Last data filed at 05/23/2019 0735 Gross per 24 hour  Intake 268 ml  Output 800 ml  Net -532 ml     Physical Exam: Vital Signs Blood pressure 119/68, pulse (!) 105, temperature 98 F (36.7 C), temperature source Oral, resp. rate 19, height 5\' 10"  (1.778 m), weight (!) 136.4 kg, SpO2 98 %.  Constitutional: No distress . Vital signs reviewed. HEENT: EOMI, oral membranes moist Neck: supple Cardiovascular: RRR without murmur. No JVD    Respiratory: CTA Bilaterally without wheezes or rales. Normal effort    GI: BS +, non-tender, non-distended  Extremities: No clubbing, cyanosis, or edema Skin: lower back incision with steristrips in place Neurologic: Cranial nerves II through XII intact, motor strength is 5/5 in bilateral deltoid, bicep, tricep, grip,3. RLE: 2/5 HF, KE, APF, 1/5 APF. LLE 3/5. Decreased LT, pain R>L LE below mid abdomen. Stable exam today Musculoskeletal: Full range of motion in all 4 extremities. No joint swelling Psych: pleasant   Assessment/Plan: 1. Functional deficits secondary to thoracic myelopathy which require 3+ hours per day of interdisciplinary therapy in a comprehensive inpatient rehab setting.  Physiatrist is providing close team supervision and 24 hour management of active medical problems listed below.  Physiatrist and rehab team continue  to assess barriers to discharge/monitor patient progress toward functional and medical goals  Care Tool:  Bathing    Body parts bathed by patient: Right arm, Left arm, Chest, Abdomen, Right upper leg, Left upper leg, Face   Body parts bathed by helper: Front perineal area, Buttocks, Right lower leg, Left lower leg     Bathing assist Assist Level: 2 Helpers     Upper Body Dressing/Undressing Upper body dressing   What is the patient wearing?: Pull over shirt    Upper body assist Assist Level: Set up assist    Lower Body Dressing/Undressing Lower body dressing      What is the patient wearing?: Pants, Incontinence brief     Lower body assist Assist for lower body dressing: 2 Helpers     Toileting Toileting    Toileting assist Assist for toileting: Total Assistance - Patient < 25%     Transfers Chair/bed transfer  Transfers assist  Chair/bed transfer activity did not occur: N/A  Chair/bed transfer assist level: Moderate Assistance - Patient 50 - 74%(slide board)     Locomotion Ambulation   Ambulation assist   Ambulation activity did not occur: Safety/medical concerns          Walk 10 feet activity   Assist  Walk 10 feet activity did not occur: Safety/medical concerns        Walk 50 feet activity   Assist Walk 50 feet with 2 turns activity did not occur: Safety/medical concerns         Walk 150  feet activity   Assist Walk 150 feet activity did not occur: Safety/medical concerns         Walk 10 feet on uneven surface  activity   Assist Walk 10 feet on uneven surfaces activity did not occur: Safety/medical concerns         Wheelchair     Assist Will patient use wheelchair at discharge?: Yes Type of Wheelchair: Manual(pt owns both, uses power w/c mainly)    Wheelchair assist level: Supervision/Verbal cueing Max wheelchair distance: 150'    Wheelchair 50 feet with 2 turns activity    Assist        Assist  Level: Supervision/Verbal cueing   Wheelchair 150 feet activity     Assist     Assist Level: Supervision/Verbal cueing    Medical Problem List and Plan:  1.Progressive quadriparesissecondary to thoracic lumbar myelopathy status post T5-6 laminectomy, T11-12 laminectomy, L3-4 laminectomy 05/14/2019 as well as history of prior cervical discectomy and thoracic laminectomy in the past. No brace required  -continue CIR therapies today including PT and OT  2. Antithrombotics: -DVT/anticoagulation:SCDs.Chronic Coumadin resumed -antiplatelet therapy: N/A 3. Pain Management:Neurontin 300 mg 3 times daily, Flexeril and oxycodone as needed 4. Mood:Provide emotional support -antipsychotic agents: N/A 5. Neuropsych: This patientiscapable of making decisions on hisown behalf. 6. Skin/Wound Care:Routine skin checks 7. Fluids/Electrolytes/Nutrition:encourage PO     8.History of clotting disorder pulmonary emboli DVT. coumadin resumed 9.Hypertension. Toprol-XL 50 mg daily, Lasix 40 mg daily. 10. Constipation/neurogenic bowel:. moved bowels   -regular bowel program initiated 11. Low grade temp, leukocytosis:  -UA negative, ucx negative  -IS  -check PVR's (not done)    LOS: 2 days A FACE TO FACE EVALUATION WAS PERFORMED  Meredith Staggers 05/23/2019, 10:01 AM

## 2019-05-24 ENCOUNTER — Inpatient Hospital Stay (HOSPITAL_COMMUNITY): Payer: Medicare HMO | Admitting: Physical Therapy

## 2019-05-24 LAB — PROTIME-INR
INR: 1.9 — ABNORMAL HIGH (ref 0.8–1.2)
Prothrombin Time: 21.6 seconds — ABNORMAL HIGH (ref 11.4–15.2)

## 2019-05-24 MED ORDER — WARFARIN SODIUM 5 MG PO TABS
10.0000 mg | ORAL_TABLET | Freq: Once | ORAL | Status: AC
  Administered 2019-05-24: 10 mg via ORAL
  Filled 2019-05-24: qty 2

## 2019-05-24 NOTE — Progress Notes (Signed)
ANTICOAGULATION CONSULT NOTE   Pharmacy Consult:  Coumadin Indication:  History of recurrent VTEs  Allergies  Allergen Reactions  . Ace Inhibitors Other (See Comments)    cough  . Lisinopril Other (See Comments)    cough   Patient Measurements: Height: 5\' 10"  (177.8 cm) Weight: (!) 300 lb 11.3 oz (136.4 kg) IBW/kg (Calculated) : 73  Vital Signs: Temp: 98.4 F (36.9 C) (06/28 0422) Temp Source: Oral (06/28 0422) BP: 119/75 (06/28 0422) Pulse Rate: 99 (06/28 0422)  Labs: Recent Labs    05/22/19 0659 05/23/19 0538 05/24/19 0750  HGB 14.6  --   --   HCT 45.5  --   --   PLT 163  --   --   LABPROT 18.6* 19.5* 21.6*  INR 1.6* 1.7* 1.9*  CREATININE 0.90  --   --    Estimated Creatinine Clearance: 133.6 mL/min (by C-G formula based on SCr of 0.9 mg/dL).  Medical History: Past Medical History:  Diagnosis Date  . Arthritis   . Clotting disorder ([place])    pulmonary embolus, DVT  . Depression   . Diabetes mellitus without complication (HCC)    no meds, watching diet  . DVT (deep venous thrombosis) ([place])    Summer 2012  . Hypertension   . Neuromuscular disorder ([place])    spinal stenosis.- pt walks with a walker short distance  . Peripheral vascular disease ([place])   . PONV (postoperative nausea and vomiting)   . Pulmonary embolism ([place])    x 3 in 2005, 2008, 2010  . Sleep apnea    does not wear a c-pap  . Spinal stenosis    Assessment: 85 YOM with progressive lower extremity weakness s/p laminectomies on 05/14/19.  Pharmacy consulted to resume Coumadin from PTA for history of recurrent VTEs. Admit INR 6/18 was 1.0 Home Coumadin dose: 5mg  daily except 10mg  on Tues/Thurs/Sun  Warfarin resumed 6/23 with 15,10,5,7.5,10 mg MD had changed 6/25 Warfarin to 10mg  but 5mg  order not discontinued > 5mg  given  Goal of Therapy:  INR 2-3 Monitor platelets by anticoagulation protocol: Yes   Today, 05/24/2019 INR 1.9 this am, no bridging added, INR increasing 6/26 CBC: Hgb 14.6,  Plt 163 No s/s bleed   Plan:   Warfarin 10mg  today at 1800 CBC am Daily INR, monitor for s/s bleeding  Minda Ditto PharmD Clinical Pharmacist [phone] Please check AMION for all [place] numbers 05/24/2019 9:10 AM

## 2019-05-24 NOTE — Progress Notes (Signed)
[facility] PHYSICAL MEDICINE & REHABILITATION PROGRESS NOTE   Subjective/Complaints: No new complaints. Had a good night. Denies pain. Emptying bladder without issues so far this weekend  ROS: Patient denies fever, rash, sore throat, blurred vision, nausea, vomiting, diarrhea, cough, shortness of breath or chest pain, joint or back pain, headache, or mood change.   Objective:   No results found. Recent Labs    05/22/19 0659  WBC 12.9*  HGB 14.6  HCT 45.5  PLT 163   Recent Labs    05/22/19 0659  NA 134*  K 4.9  CL 95*  CO2 28  GLUCOSE 192*  BUN 13  CREATININE 0.90  CALCIUM 8.9    Intake/Output Summary (Last 24 hours) at 05/24/2019 0935 Last data filed at 05/24/2019 6269 Gross per 24 hour  Intake 360 ml  Output 500 ml  Net -140 ml     Physical Exam: Vital Signs Blood pressure 119/75, pulse 99, temperature 98.4 F (36.9 C), temperature source Oral, resp. rate 19, height 5\' 10"  (1.778 m), weight (!) 136.4 kg, SpO2 95 %.  Constitutional: No distress . Vital signs reviewed. HEENT: EOMI, oral membranes moist Neck: supple Cardiovascular: RRR without murmur. No JVD    Respiratory: CTA Bilaterally without wheezes or rales. Normal effort    GI: BS +, non-tender, non-distended  Extremities: No clubbing, cyanosis, or edema Skin: lower back incision with steristrips in place Neurologic: Cranial nerves II through XII intact, motor strength is 5/5 in bilateral deltoid, bicep, tricep, grip,3. RLE: 2/5 HF, KE, APF, 1/5 APF. LLE 3/5. Decreased LT, pain R>L LE below mid abdomen. Motor/sensory exam stable Musculoskeletal: Full range of motion in all 4 extremities. No joint swelling Psych: pleasant   Assessment/Plan: 1. Functional deficits secondary to thoracic myelopathy which require 3+ hours per day of interdisciplinary therapy in a comprehensive inpatient rehab setting.  Physiatrist is providing close team supervision and 24 hour management of active medical problems listed  below.  Physiatrist and rehab team continue to assess barriers to discharge/monitor patient progress toward functional and medical goals  Care Tool:  Bathing    Body parts bathed by patient: Right arm, Left arm, Chest, Abdomen, Right upper leg, Left upper leg, Face, Front perineal area, Left lower leg, Right lower leg   Body parts bathed by helper: Buttocks     Bathing assist Assist Level: Maximal Assistance - Patient 24 - 49%     Upper Body Dressing/Undressing Upper body dressing   What is the patient wearing?: Hospital gown only    Upper body assist Assist Level: Set up assist    Lower Body Dressing/Undressing Lower body dressing      What is the patient wearing?: Pants, Incontinence brief     Lower body assist Assist for lower body dressing: 2 Helpers     Toileting Toileting    Toileting assist Assist for toileting: Total Assistance - Patient < 25%     Transfers Chair/bed transfer  Transfers assist  Chair/bed transfer activity did not occur: N/A  Chair/bed transfer assist level: Dependent - Patient 0%([place])     Locomotion Ambulation   Ambulation assist   Ambulation activity did not occur: Safety/medical concerns          Walk 10 feet activity   Assist  Walk 10 feet activity did not occur: Safety/medical concerns        Walk 50 feet activity   Assist Walk 50 feet with 2 turns activity did not occur: Safety/medical concerns  Walk 150 feet activity   Assist Walk 150 feet activity did not occur: Safety/medical concerns         Walk 10 feet on uneven surface  activity   Assist Walk 10 feet on uneven surfaces activity did not occur: Safety/medical concerns         Wheelchair     Assist Will patient use wheelchair at discharge?: Yes Type of Wheelchair: Manual    Wheelchair assist level: Set up assist, Supervision/Verbal cueing Max wheelchair distance: 65ft    Wheelchair 50 feet with 2 turns  activity    Assist        Assist Level: Supervision/Verbal cueing   Wheelchair 150 feet activity     Assist     Assist Level: Supervision/Verbal cueing    Medical Problem List and Plan:  1.Progressive quadriparesissecondary to thoracic lumbar myelopathy status post T5-6 laminectomy, T11-12 laminectomy, L3-4 laminectomy 05/14/2019 as well as history of prior cervical discectomy and thoracic laminectomy in the past. No brace required  -continue CIR therapies today including PT and OT  2. Antithrombotics: -DVT/anticoagulation:SCDs.Chronic Coumadin resumed 1.9 -antiplatelet therapy: N/A 3. Pain Management:Neurontin 300 mg 3 times daily, Flexeril and oxycodone as needed 4. Mood:Provide emotional support -antipsychotic agents: N/A 5. Neuropsych: This patientiscapable of making decisions on hisown behalf. 6. Skin/Wound Care:Routine skin checks. steristrips to back incisions 7. Fluids/Electrolytes/Nutrition:encourage PO     8.History of clotting disorder pulmonary emboli DVT. coumadin resumed 9.Hypertension. Toprol-XL 50 mg daily, Lasix 40 mg daily.  -controlled at present 10. Constipation/neurogenic bowel:. moved bowels   -regular bowel program initiated 11. Low grade temp, leukocytosis: afebrile  -UA negative, ucx negative. Emptying well at present  -IS    LOS: 3 days A FACE TO [place] 05/24/2019, 9:35 AM

## 2019-05-24 NOTE — Progress Notes (Signed)
Physical Therapy Session Note  Patient Details  Name: Nicolas [name] MRN: [id number] Date of Birth: Oct 09, 1966  Today's Date: 05/24/2019 PT Individual Time: 0930-1030 PT Individual Time Calculation (min): 60 min   Short Term Goals: Week 1:  PT Short Term Goal 1 (Week 1): Pt will complete least restrictive transfer with min A PT Short Term Goal 2 (Week 1): Pt will initiate standing PT Short Term Goal 2 - Progress (Week 1): Met PT Short Term Goal 3 (Week 1): Pt will complete bed mobility with min A  Skilled Therapeutic Interventions/Progress Updates:   pt in bed agreeable to therapy. Supine to sit with use of bedrail and increased time, supervision.  Sit <> stand to don pants requiring 2 attempts and mod A from elevated bed.  Stand pivot transfer with RW to w/c with mod A.  Seated kinetron with pt holding kinetron arm rests to promote upright posture, pedals angled to promote glute and HS activation. Pt able to propel 5 x 1 minute with 60-90 seconds rest in between sets.  Pt propels w/c throughout unit supervision, min A for parts management. Scooting transfers to level surfaces blocked practice with pt able to perform with min A.  Attempt sit <> stand training from elevated surface, pt's LEs fatigued, unable to come to a full stand.  Seated AAROM for bilat HS, quad and hip abd/add activation with tactile cues.  Pt left in w/c with all needs at hand.   Therapy Documentation Precautions:  Precautions Precautions: Fall, Back Precaution Comments: revirewed BLT percautions  Restrictions Weight Bearing Restrictions: No Pain: Pt with no c/o pain during session   Therapy/Group: Individual Therapy  Jerral Mccauley 05/24/2019, 11:45 AM

## 2019-05-24 NOTE — IPOC Note (Signed)
Overall Plan of Care [facility]) Patient Details Name: KASPIAN MUCCIO MRN: [id number] DOB: 29-Aug-1966  Admitting Diagnosis: <principal problem not specified>  Hospital Problems: Active Problems:   Thoracic myelopathy     Functional Problem List: Nursing Edema, Endurance, Medication Management, Pain, Safety, Sensory, Skin Integrity  PT Balance, Motor, Safety, Sensory, Skin Integrity  OT Balance, Endurance, Motor, Pain  SLP    TR         Basic ADL's: OT Grooming, Bathing, Dressing, Toileting     Advanced  ADL's: OT Simple Meal Preparation, Laundry, Light Housekeeping     Transfers: PT Bed Mobility, Bed to Chair, Car, Furniture, Futures trader, Metallurgist: PT Ambulation, Emergency planning/management officer, Stairs     Additional Impairments: OT None  SLP        TR      Anticipated Outcomes Item Anticipated Outcome  Self Feeding independent  Swallowing      Basic self-care  set up/min A  Toileting  set up/min A   Bathroom Transfers set up/min A  Bowel/Bladder  Supervision  Transfers  Supervision to min A overall  Locomotion  mod I at w/c level  Communication     Cognition     Pain  <3 on a 0-10 pain scale  Safety/Judgment  min assist   Therapy Plan: PT Intensity: Minimum of 1-2 x/day ,45 to 90 minutes PT Frequency: 5 out of 7 days PT Duration Estimated Length of Stay: 10-12 days OT Intensity: Minimum of 1-2 x/day, 45 to 90 minutes OT Frequency: 5 out of 7 days OT Duration/Estimated Length of Stay: 10-12 days     Due to the current state of emergency, patients may not be receiving their 3-hours of Medicare-mandated therapy.   Team Interventions: Nursing Interventions Patient/Family Education, Pain Management, Medication Management, Discharge Planning, Skin Care/Wound Management, Disease Management/Prevention  PT interventions Ambulation/gait training, Training and development officer, Community reintegration, Discharge planning, Disease  management/prevention, DME/adaptive equipment instruction, Functional mobility training, Functional electrical stimulation, Neuromuscular re-education, Pain management, Patient/family education, Psychosocial support, Splinting/orthotics, Therapeutic Activities, Therapeutic Exercise, UE/LE Strength taining/ROM, UE/LE Coordination activities, Wheelchair propulsion/positioning  OT Interventions Balance/vestibular training, Self Care/advanced ADL retraining, Therapeutic Exercise, DME/adaptive equipment instruction, Pain management, Community reintegration, Patient/family education, Discharge planning, Functional mobility training, Therapeutic Activities  SLP Interventions    TR Interventions    SW/CM Interventions Discharge Planning, Psychosocial Support, Patient/Family Education   Barriers to Discharge MD  Medical stability  Nursing Inaccessible home environment, Decreased caregiver support, Medical stability, Home environment access/layout, Wound Care, Lack of/limited family support, Medication compliance    PT Decreased caregiver support, Medical stability    OT      SLP      SW       Team Discharge Planning: Destination: PT-Home ,OT- Home , SLP-  Projected Follow-up: PT-Home health PT, OT-  Home health OT, SLP-  Projected Equipment Needs: PT-To be determined, OT- 3 in 1 bedside comode, SLP-  Equipment Details: PT-pt already owns majority of necessary equipment, OT-  Patient/family involved in discharge planning: PT- Patient,  OT-Patient, SLP-   MD ELOS: 10-12 days Medical Rehab Prognosis:  Excellent Assessment: The patient has been admitted for CIR therapies with the diagnosis of thoracic myelopathy. The team will be addressing functional mobility, strength, stamina, balance, safety, adaptive techniques and equipment, self-care, bowel and bladder mgt, patient and caregiver education, NMR, w/c use, spinal precautions. Goals have been set at set up to min assist with self-care and  supervision to min  assist with transfers and mod I with w/c mobility.   Due to the current state of emergency, patients may not be receiving their 3 hours per day of Medicare-mandated therapy.    Meredith Staggers, MD, FAAPMR      See Team Conference Notes for weekly updates to the plan of care

## 2019-05-25 ENCOUNTER — Inpatient Hospital Stay (HOSPITAL_COMMUNITY): Payer: Medicare HMO | Admitting: Occupational Therapy

## 2019-05-25 ENCOUNTER — Inpatient Hospital Stay (HOSPITAL_COMMUNITY): Payer: Medicare HMO | Admitting: Physical Therapy

## 2019-05-25 LAB — CBC
HCT: 41.5 % (ref 39.0–52.0)
Hemoglobin: 13.4 g/dL (ref 13.0–17.0)
MCH: 29.9 pg (ref 26.0–34.0)
MCHC: 32.3 g/dL (ref 30.0–36.0)
MCV: 92.6 fL (ref 80.0–100.0)
Platelets: 272 10*3/uL (ref 150–400)
RBC: 4.48 MIL/uL (ref 4.22–5.81)
RDW: 13.9 % (ref 11.5–15.5)
WBC: 9.6 10*3/uL (ref 4.0–10.5)
nRBC: 0 % (ref 0.0–0.2)

## 2019-05-25 LAB — GLUCOSE, CAPILLARY
Glucose-Capillary: 173 mg/dL — ABNORMAL HIGH (ref 70–99)
Glucose-Capillary: 180 mg/dL — ABNORMAL HIGH (ref 70–99)
Glucose-Capillary: 179 mg/dL — ABNORMAL HIGH (ref 70–99)
Glucose-Capillary: 173 mg/dL — ABNORMAL HIGH (ref 70–99)

## 2019-05-25 LAB — PROTIME-INR
INR: 2.2 — ABNORMAL HIGH (ref 0.8–1.2)
Prothrombin Time: 23.8 seconds — ABNORMAL HIGH (ref 11.4–15.2)

## 2019-05-25 MED ORDER — WARFARIN SODIUM 5 MG PO TABS
5.0000 mg | ORAL_TABLET | Freq: Once | ORAL | Status: AC
  Administered 2019-05-25: 5 mg via ORAL
  Filled 2019-05-25: qty 1

## 2019-05-25 MED ORDER — WARFARIN SODIUM 7.5 MG PO TABS: 7.5000 mg | ORAL_TABLET | Freq: Once | ORAL | Status: DC

## 2019-05-25 NOTE — Progress Notes (Signed)
ANTICOAGULATION CONSULT NOTE   Pharmacy Consult: Warfarin  Indication:  History of recurrent VTE  Allergies  Allergen Reactions  . Ace Inhibitors Other (See Comments)    cough  . Lisinopril Other (See Comments)    cough   Patient Measurements: Height: 5\' 10"  (177.8 cm) Weight: (!) 300 lb 11.3 oz (136.4 kg) IBW/kg (Calculated) : 73  Vital Signs: Temp: 98.2 F (36.8 C) (06/29 1327) Temp Source: Oral (06/29 0512) BP: 151/81 (06/29 1327) Pulse Rate: 109 (06/29 1327)  Labs: Recent Labs    05/23/19 0538 05/24/19 0750 05/25/19 0720  HGB  --   --  13.4  HCT  --   --  41.5  PLT  --   --  272  LABPROT 19.5* 21.6* 23.8*  INR 1.7* 1.9* 2.2*   Estimated Creatinine Clearance: 133.6 mL/min (by C-G formula based on SCr of 0.9 mg/dL).  Medical History: Past Medical History:  Diagnosis Date  . Arthritis   . Clotting disorder ([place])    pulmonary embolus, DVT  . Depression   . Diabetes mellitus without complication (HCC)    no meds, watching diet  . DVT (deep venous thrombosis) ([place])    Summer 2012  . Hypertension   . Neuromuscular disorder ([place])    spinal stenosis.- pt walks with a walker short distance  . Peripheral vascular disease ([place])   . PONV (postoperative nausea and vomiting)   . Pulmonary embolism ([employer])    x 3 in 2005, 2008, 2010  . Sleep apnea    does not wear a c-pap  . Spinal stenosis    Assessment: 22 YOM with progressive lower extremity weakness s/p laminectomies on 05/14/19. Admit INR 6/18 was 1.0 (had been instructed by anticoagulation clinic to stop warfarin on 6/12, bridge with enoxaparin). Pharmacy consulted to resume warfarin on 6/23 from PTA for history of recurrent VTE.   Home Warfarin dose: 5mg  daily except 10mg  on Tues/Thurs/Sun  Goal of Therapy:  INR 2-3 Monitor platelets by anticoagulation protocol: Yes   Today, 05/25/2019 INR 2.2, now therapeutic  CBC: H/H, Pltc WNL  No bleeding reported    Plan:   Warfarin 5mg  PO x 1 today at  1800 Daily PT/INR Monitor CBC and for s/sx of bleeding   Lindell Spar, PharmD, BCPS Clinical Pharmacist Please check AMION for all [place] numbers 05/25/2019 3:06 PM

## 2019-05-25 NOTE — Progress Notes (Signed)
Occupational Therapy Session Note  Patient Details  Name: Nicolas [name] MRN: [id number] Date of Birth: 01-Aug-1966  Today's Date: 05/25/2019 OT Individual Time: 1000-1100 and 1345-1455 OT Individual Time Calculation (min): 60 min and 70 min   Short Term Goals: Week 1:  OT Short Term Goal 1 (Week 1): patient will complete bed mobility with CS OT Short Term Goal 2 (Week 1): patient will complete sit pivot transfers with CS OT Short Term Goal 3 (Week 1): patient will complete LB dressing with min A using ADs  Skilled Therapeutic Interventions/Progress Updates:    Session One: Pt seen for OT ADL bathing/dressing session. Pt sitting up in w/c upon arrival, agreeable to tx session and denying pain. Pt agreeable to showering this session, back dressings covered prior to shower.  Attempted stand/squat pivot transfer to shower, however, due to small size of bathroom and pt's body habitus, set-up did not allow for this. Therefore, used STEDY to transfer to [facility] in shower, pt powering up into standing with Min A, heavy reliance on UEs in order to power into standing. He bathed seated on BSC in shower with supervision/ set-up. LH sponge to wash LEs. He returned to w/c to dress, set-up assist to dress UB. He used reacher to thread B LEs into pants with supervision. He stood at [employer] with mod A and assist to [employer] equipment. He was able to assist with pulling pants up, maintaining one UE support at all times. Total A to don TED hose and shoes.  Used STEDY to transfer to recliner, w/c cushion placed in recliner in order to allow for higher seating surface and comfort. Pt left seated in recliner at end of session, all needs in reach.   Session Two: Pt seen for OT session focusing on functional standing balance/endurance, sit>stand, and LE strengthening in prep for functional tasks. Pt in supine upon arrival, denying pain and agreeable to tx session. He transferred to sitting EOB with min A using hospital bed  functions and VCs for log rolling technique. Throughout session, he completed stand/step pivot transfers with RW, min A overall with VCs for technique and heavy reliance on UEs. He self propelled w/c throughout unit mod I for UE strengthening. Seated on EOM, completed x10 sit>stand from highly elevated EOM with emphasis on LE control, limiting UE reliance to power into standing and to maintain standing position. Seated rest breaks required throughout.  Completed dynamic standing activity, matching playing cards to board placed in front of him, required to alternate UE support. Min-mod A for standing balance without UE support and urgent seated rest breaks required. Pt tolerating ~15 seconds each standing trial before requiring seated rest break.  Pt transitioned to supine on mat. Completed x10 "clam shell" exercises, and x10 heel slides on R/L, assist required for R LE during exercises and VCs for controlled breathing and movements with exercises. Pt returned to sitting EOM and transitioned to w/c. Requested to return to supine at end of session, left with all needs in reach and bed alarm on.  Throughout session discussed OT goals, pt goals, PLOF, modified ADLs/IADLs and d/c planning.   Therapy Documentation Precautions:  Precautions Precautions: Fall, Back Precaution Comments: revirewed BLT percautions  Restrictions Weight Bearing Restrictions: No   Therapy/Group: Individual Therapy  Jerriyah Louis L 05/25/2019, 7:11 AM

## 2019-05-25 NOTE — Plan of Care (Signed)
Added short distance gait goal due to progress

## 2019-05-25 NOTE — Progress Notes (Signed)
Nicolas [name] PHYSICAL MEDICINE & REHABILITATION PROGRESS NOTE   Subjective/Complaints: Slept well. Pain seems controlled  ROS: Patient denies fever, rash, sore throat, blurred vision, nausea, vomiting, diarrhea, cough, shortness of breath or chest pain, joint or back pain, headache, or mood change.    Objective:   No results found. Recent Labs    05/25/19 0720  WBC 9.6  HGB 13.4  HCT 41.5  PLT 272   No results for input(s): NA, K, CL, CO2, GLUCOSE, BUN, CREATININE, CALCIUM in the last 72 hours.  Intake/Output Summary (Last 24 hours) at 05/25/2019 1004 Last data filed at 05/25/2019 0900 Gross per 24 hour  Intake 480 ml  Output 350 ml  Net 130 ml     Physical Exam: Vital Signs Blood pressure (!) 142/93, pulse 100, temperature 98.2 F (36.8 C), temperature source Oral, resp. rate 14, height 5\' 10"  (1.778 m), weight (!) 136.4 kg, SpO2 97 %.  Constitutional: No distress . Vital signs reviewed. HEENT: EOMI, oral membranes moist Neck: supple Cardiovascular: RRR without murmur. No JVD    Respiratory: CTA Bilaterally without wheezes or rales. Normal effort    GI: BS +, non-tender, non-distended  Extremities: No clubbing, cyanosis, or edema Skin: lower back incision with steristrips in place Neurologic: Cranial nerves II through XII intact, motor strength is 5/5 in bilateral deltoid, bicep, tricep, grip,3+. RLE: 2+/5 HF, KE, APF, 1/5 APF. LLE 3/5. Decreased LT, pain R>L LE below mid abdomen.   Musculoskeletal: Full range of motion in all 4 extremities. No joint swelling Psych: pleasant   Assessment/Plan: 1. Functional deficits secondary to thoracic myelopathy which require 3+ hours per day of interdisciplinary therapy in a comprehensive inpatient rehab setting.  Physiatrist is providing close team supervision and 24 hour management of active medical problems listed below.  Physiatrist and rehab team continue to assess barriers to discharge/monitor patient progress toward  functional and medical goals  Care Tool:  Bathing    Body parts bathed by patient: Right arm, Left arm, Chest, Abdomen, Right upper leg, Left upper leg, Face, Front perineal area, Left lower leg, Right lower leg   Body parts bathed by helper: Buttocks     Bathing assist Assist Level: Maximal Assistance - Patient 24 - 49%     Upper Body Dressing/Undressing Upper body dressing   What is the patient wearing?: Hospital gown only    Upper body assist Assist Level: Set up assist    Lower Body Dressing/Undressing Lower body dressing      What is the patient wearing?: Pants, Incontinence brief     Lower body assist Assist for lower body dressing: 2 Helpers     Toileting Toileting    Toileting assist Assist for toileting: Independent with assistive device Assistive Device Comment: (Urinal)   Transfers Chair/bed transfer  Transfers assist  Chair/bed transfer activity did not occur: N/A  Chair/bed transfer assist level: Moderate Assistance - Patient 50 - 74%     Locomotion Ambulation   Ambulation assist   Ambulation activity did not occur: Safety/medical concerns          Walk 10 feet activity   Assist  Walk 10 feet activity did not occur: Safety/medical concerns        Walk 50 feet activity   Assist Walk 50 feet with 2 turns activity did not occur: Safety/medical concerns         Walk 150 feet activity   Assist Walk 150 feet activity did not occur: Safety/medical concerns  Walk 10 feet on uneven surface  activity   Assist Walk 10 feet on uneven surfaces activity did not occur: Safety/medical concerns         Wheelchair     Assist Will patient use wheelchair at discharge?: Yes Type of Wheelchair: Manual    Wheelchair assist level: Supervision/Verbal cueing Max wheelchair distance: 200'    Wheelchair 50 feet with 2 turns activity    Assist        Assist Level: Supervision/Verbal cueing   Wheelchair 150  feet activity     Assist     Assist Level: Supervision/Verbal cueing    Medical Problem List and Plan:  1.Progressive quadriparesissecondary to thoracic lumbar myelopathy status post T5-6 laminectomy, T11-12 laminectomy, L3-4 laminectomy 05/14/2019 as well as history of prior cervical discectomy and thoracic laminectomy in the past. No brace required  -continue CIR therapies today including PT and OT  2. Antithrombotics: -DVT/anticoagulation:SCDs.Chronic Coumadin resumed   -antiplatelet therapy: N/A 3. Pain Management:Neurontin 300 mg 3 times daily, Flexeril and oxycodone as needed 4. Mood:Provide emotional support -antipsychotic agents: N/A 5. Neuropsych: This patientiscapable of making decisions on hisown behalf. 6. Skin/Wound Care:Routine skin checks. steristrips to back incisions 7. Fluids/Electrolytes/Nutrition:encourage PO     8.History of clotting disorder pulmonary emboli DVT. coumadin resumed 9.Hypertension. Toprol-XL 50 mg daily, Lasix 40 mg daily.  -DBP elevated  -consider med adjustment if persistently elevated 10. Constipation/neurogenic bowel:. moved bowels   -regular bowel program initiated 11. Low grade temp, leukocytosis: afebrile  -UA negative, ucx negative. Emptying well at present  -continue IS, OOB    LOS: 4 days A FACE TO FACE EVALUATION WAS PERFORMED  Meredith Staggers 05/25/2019, 10:04 AM

## 2019-05-25 NOTE — Progress Notes (Signed)
Physical Therapy Session Note  Patient Details  Name: BION TODOROV MRN: [id number] Date of Birth: 1966/06/13  Today's Date: 05/25/2019 PT Individual Time: 0800-0900 PT Individual Time Calculation (min): 60 min   Short Term Goals: Week 1:  PT Short Term Goal 1 (Week 1): Pt will complete least restrictive transfer with min A PT Short Term Goal 2 (Week 1): Pt will initiate standing PT Short Term Goal 2 - Progress (Week 1): Met PT Short Term Goal 3 (Week 1): Pt will complete bed mobility with min A  Skilled Therapeutic Interventions/Progress Updates:    Pt received seated in bed, agreeable to PT session. No complaints of pain. Dependent to don TED hose while seated in bed. Semi-reclined to sitting EOB with Supervision and use of bedrails. Dependent to don shoes while seated EOB. Sit to stand with max A to RW, dependent to pull up pants over bottom while standing with RW. Stand pivot transfer bed to w/c with mod and RW. Manual w/c propulsion x 200 ft with use of BUE and Supervision. Stand pivot transfer w/c to/from mat table with RW and mod A. Sit to stand x 5 reps to RW with mod A, focus on anterior weight shift and decreased BUE support in standing. Pt continues to rely heavily on BUE in standing due to LE weakness. Pt is able to take one step forward/back x 5 reps with RW and mod A with use of mirror for visual feedback due to decreased BLE proprioception. Alt L/R 1" step taps x 5 reps with RW and mod A, assist to lift RLE onto step. Pt fatigues quickly with standing activity and requires frequent seated rest breaks throughout session. Pt left seated in w/c in room with needs in reach at end of session.  Therapy Documentation Precautions:  Precautions Precautions: Fall, Back Precaution Comments: revirewed BLT percautions  Restrictions Weight Bearing Restrictions: No    Therapy/Group: Individual Therapy   Excell Seltzer, PT, DPT  05/25/2019, 9:46 AM

## 2019-05-25 NOTE — Progress Notes (Signed)
Social Work Assessment and Plan   Patient Details  Name: Nicolas [name] MRN: [id number] Date of Birth: 09-14-1966  Today's Date: 05/22/2019  Problem List:  Patient Active Problem List   Diagnosis Date Noted  . Thoracic myelopathy 05/14/2019  . Cervical spondylosis with myelopathy 01/26/2019  . Class 3 severe obesity due to excess calories with serious comorbidity and body mass index (BMI) of 40.0 to 44.9 in adult ([place]) 10/10/2017  . Prediabetes 10/10/2017  . Debility 10/10/2017  . Able to mobilize using indoor motorized wheelchair 10/10/2017  . Frequent falls 02/01/2017  . At high risk for falls 03/12/2016  . Hypoxemia 05/23/2014  . Chronic anticoagulation 05/21/2014  . Spinal stenosis 12/02/2013  . Venous stasis dermatitis 09/26/2012  . Encounter for long-term (current) use of anticoagulants 03/26/2012  . Spinal stenosis of lumbar region 03/06/2012  . HTN (hypertension) 03/06/2012  . Hx pulmonary embolism 03/06/2012  . Lower extremity edema 03/06/2012   Past Medical History:  Past Medical History:  Diagnosis Date  . Arthritis   . Clotting disorder ([place])    pulmonary embolus, DVT  . Depression   . Diabetes mellitus without complication (HCC)    no meds, watching diet  . DVT (deep venous thrombosis) ([place])    Summer 2012  . Hypertension   . Neuromuscular disorder ([place])    spinal stenosis.- pt walks with a walker short distance  . Peripheral vascular disease ([place])   . PONV (postoperative nausea and vomiting)   . Pulmonary embolism ([place])    x 3 in 2005, 2008, 2010  . Sleep apnea    does not wear a c-pap  . Spinal stenosis    Past Surgical History:  Past Surgical History:  Procedure Laterality Date  . ANTERIOR CERVICAL CORPECTOMY N/A 01/26/2019   Procedure: Cervical Four Corpectomy with Cervical Three-Four, Cervical Four-Five interbody fusion, prosthesis, explore old fusion, possible removal of old plate;  Surgeon: [name], MD;  Location: [place];  Service:  Neurosurgery;  Laterality: N/A;  anterior approach  . CHOLECYSTECTOMY N/A 05/24/2014   Procedure: LAPAROSCOPIC CHOLECYSTECTOMY;  Surgeon: Zenovia Jarred, MD;  Location: [place];  Service: General;  Laterality: N/A;  . ERCP N/A 05/25/2014   Procedure: ENDOSCOPIC RETROGRADE CHOLANGIOPANCREATOGRAPHY (ERCP);  Surgeon: Beryle Beams, MD;  Location: [place];  Service: Endoscopy;  Laterality: N/A;  . ERCP N/A 07/16/2014   Procedure: ENDOSCOPIC RETROGRADE CHOLANGIOPANCREATOGRAPHY (ERCP);  Surgeon: Beryle Beams, MD;  Location: [facility];  Service: Endoscopy;  Laterality: N/A;  . ivp filter  jan 2012  . LUMBAR LAMINECTOMY/DECOMPRESSION MICRODISCECTOMY N/A 05/14/2019   Procedure: LAMINECTOMY AND FORAMINOTOMY THORACIC FIVE- THORACIC SIX, THORACIC ELEVEN- THORACIC THORACIC, LUMBAR THREE- LUMBAR FOUR;  Surgeon: [name], MD;  Location: [facility];  Service: Neurosurgery;  Laterality: N/A;  posterior  . SPINE SURGERY  11/30/11   Total 6 back surgeries  . SPINE SURGERY  27/7412   Dr. Cathren Laine in [place], [place]  . [place]     08/2003, cervial spine 2005, 2010 lower back, 2011 lower back, 2012 & 2013  . UPPER GASTROINTESTINAL ENDOSCOPY    . WISDOM TOOTH EXTRACTION     2 removed   Social History:  reports that he quit smoking about 18 years ago. His smoking use included cigarettes. He has a 15.00 pack-year smoking history. He has never used smokeless tobacco. He reports current alcohol use of about 1.0 standard drinks of alcohol per week. He reports current drug use. Drug: Marijuana.  Family / [place] Marital  Status: Single Patient Roles: Parent Children: son, Rollen Brooke Bonito.) @ [phone] (53 yo and still living at home with pt);  daughter, Briggs Edelen (local) @ [phone] Other Supports: father, Darsh Vandevoort (local) @ [phone] Anticipated Caregiver: son Ability/Limitations of Caregiver: Min A;  son works only 4-5 hours in the evening - home by midnight. Caregiver Availability:  Intermittent Family Dynamics: Pt describes children and other family as supportive. Quickly denies any concerns about assistance available at discharge.  Social History Preferred language: English Religion: Other Cultural Background: NA Read: Yes Write: Yes Employment Status: Disabled Public relations account executive Issues: None Guardian/Conservator: None - per MD, pt is capable of making decisions on his own behalf.   Abuse/Neglect Abuse/Neglect Assessment Can Be Completed: Yes Physical Abuse: Denies Verbal Abuse: Denies Sexual Abuse: Denies Exploitation of patient/patient's resources: Denies Self-Neglect: Denies  Emotional Status Pt's affect, behavior and adjustment status: Pt very pleasant and completes assessment interview without any difficulty.  He is hopeful he can rebuild his strength on CIR.  Feels very comfortable with the amount of assistance he has at home.  Denies any significant emotional distress, however, will monitor. Recent Psychosocial Issues: None Psychiatric History: None Substance Abuse History: None  Patient / Family Perceptions, Expectations & Goals Pt/Family understanding of illness & functional limitations: Pt and family with good, general understanding of surgery performed and of current functional limitations/ need for CIR. Premorbid pt/family roles/activities: Pt was independent overall using rolling walker and power w/c (mostly in community) Anticipated changes in roles/activities/participation: Little change anticipated; son prepared to be primary caregiver. Pt/family expectations/goals: "I just need to get my strength back."  [employer]: None Premorbid Home Care/DME Agencies: None Transportation available at discharge: yes  Discharge Planning Living Arrangements: Children Support Systems: Children, Parent, Other relatives, Friends/neighbors Type of Residence: Private residence Insurance Resources: Chartered certified accountant  Resources: SSD Financial Screen Referred: No Living Expenses: Own Money Management: Patient Does the patient have any problems obtaining your medications?: No Home Management: pt and son share responsibilities Patient/Family Preliminary Plans: Pt to return home with son as primary support person. Social Work Anticipated Follow Up Needs: HH/OP Expected length of stay: 10-12 days  Clinical Impression Very pleasant gentleman here following back surgery (has had several prior surgeries) and motivated for therapies.  Living with his 30 y.o son who will be his primary caregiver.  Pt denies any emotional distress.  Will follow for support and d/c planning needs.  Filomena Pokorney 05/22/2019, 4:22 PM

## 2019-05-26 ENCOUNTER — Inpatient Hospital Stay (HOSPITAL_COMMUNITY): Payer: Medicare HMO | Admitting: Physical Therapy

## 2019-05-26 ENCOUNTER — Inpatient Hospital Stay (HOSPITAL_COMMUNITY): Payer: Medicare HMO | Admitting: Occupational Therapy

## 2019-05-26 ENCOUNTER — Inpatient Hospital Stay (HOSPITAL_COMMUNITY): Payer: Medicare HMO

## 2019-05-26 LAB — GLUCOSE, CAPILLARY
Glucose-Capillary: 169 mg/dL — ABNORMAL HIGH (ref 70–99)
Glucose-Capillary: 174 mg/dL — ABNORMAL HIGH (ref 70–99)
Glucose-Capillary: 138 mg/dL — ABNORMAL HIGH (ref 70–99)
Glucose-Capillary: 157 mg/dL — ABNORMAL HIGH (ref 70–99)

## 2019-05-26 LAB — PROTIME-INR
INR: 2.5 — ABNORMAL HIGH (ref 0.8–1.2)
Prothrombin Time: 26.4 seconds — ABNORMAL HIGH (ref 11.4–15.2)

## 2019-05-26 MED ORDER — WARFARIN SODIUM 5 MG PO TABS
5.0000 mg | ORAL_TABLET | Freq: Once | ORAL | Status: AC
  Administered 2019-05-26: 5 mg via ORAL
  Filled 2019-05-26: qty 1

## 2019-05-26 NOTE — Progress Notes (Signed)
Physical Therapy Session Note  Patient Details  Name: Nicolas [name] MRN: [id number] Date of Birth: 1966/01/26  Today's Date: 05/26/2019 PT Individual Time: 0830-0930 PT Individual Time Calculation (min): 60 min   Short Term Goals: Week 1:  PT Short Term Goal 1 (Week 1): Pt will complete least restrictive transfer with min A PT Short Term Goal 2 (Week 1): Pt will initiate standing PT Short Term Goal 2 - Progress (Week 1): Met PT Short Term Goal 3 (Week 1): Pt will complete bed mobility with min A  Skilled Therapeutic Interventions/Progress Updates:    Bed mobility to come to EOB with supervision with cues for back precautions and technique. Total assist to don Tedhose while seated EOB. Set up for upper body dressing. Using reacher, pt able to thread both legs through the shorts and requires min assist in standing to pull up pants. Mod assist for sit <> stands with RW from EOB with cues for hand placement and technique. In standing, pt able to pull up L side of pants but when attempting to take R hand off RW, unable to maintain balance and reports increased pain in back, so PT assisted with pulling up pants on R. Multiple sit <> stands with RW throughout session with overall mod assist and cues for hand placement and technique to facilitate good transitional movements and anterior weightshift. Performed stand step transfer with RW to w/c with mod assist overall. At sink, pt performed oral hygiene independently prior to leaving room. Goal to work on gait training and best device. Attempted both slings for maxi sky (first too small) and second due to the angle of pull, pt was unsuccesful with sit <> stand and incorrect leverage from sling straps and pull from harness. Moved on to trial the Lite Gait which was successful with sit <> stand but due to battery dying and pt fatigue at this point, did not get to attempt gait this session. Discussed with primary PT. Pt able to perform lateran leans and partial  w/c pushups to aid with donning and doffing of various slings. W/c propulsion for functional UE strengthening and endurance at supervision level x 150'.  Therapy Documentation Precautions:  Precautions Precautions: Fall, Back Precaution Comments: revirewed BLT percautions  Restrictions Weight Bearing Restrictions: No    Pain: Pain Assessment Pain Scale: 0-10 Pain Score: 7  Pain Type: Acute pain Pain Location: Back Pain Descriptors / Indicators: Aching Pain Onset: Gradual Pain Intervention(s): Medication (See eMAR)       Therapy/Group: Individual Therapy  Canary Brim Ivory Broad, PT, DPT, CBIS  05/26/2019, 9:40 AM

## 2019-05-26 NOTE — Progress Notes (Signed)
Physical Therapy Session Note  Patient Details  Name: Nicolas [name] MRN: [id number] Date of Birth: 21-Jan-1966  Today's Date: 05/26/2019 PT Individual Time: 1300-1400 PT Individual Time Calculation (min): 60 min   Short Term Goals: Week 1:  PT Short Term Goal 1 (Week 1): Pt will complete least restrictive transfer with min A PT Short Term Goal 2 (Week 1): Pt will initiate standing PT Short Term Goal 2 - Progress (Week 1): Met PT Short Term Goal 3 (Week 1): Pt will complete bed mobility with min A  Skilled Therapeutic Interventions/Progress Updates:    Pt received seated in w/c in room, agreeable to PT session. No complaints of pain. Manual w/c propulsion x 150 ft with use of BUE and Supervision. Provided pt with different 18x20 w/c due to current chair pulling to the R. Pt performs multiple stand pivot transfers with RW and min A throughout session. Ambulation 2 x 15 ft with RW and min to mod A with close w/c follow for safety. Pt has no instances of BLE buckling with gait but does rely heavily on use of BUE on RW. Standing balance with one UE on RW while throwing horseshoes, min A for standing balance. Pt tends to flex hips and trunk with onset of fatigue, able to correct with tactile and verbal cueing. Alt L/R 1" step taps with RW and min A for balance, improved ability to lift and control RLE. Pt requests to return to bed at end of session. Stand pivot transfer w/c to bed with min A and RW. Sit to supine Supervision. Pt left seated in bed with needs in reach, bed alarm in place at end of session.  Therapy Documentation Precautions:  Precautions Precautions: Fall, Back Precaution Comments: revirewed BLT percautions  Restrictions Weight Bearing Restrictions: No    Therapy/Group: Individual Therapy   Excell Seltzer, PT, DPT  05/26/2019, 4:13 PM

## 2019-05-26 NOTE — Progress Notes (Signed)
Occupational Therapy Session Note  Patient Details  Name: Nicolas [name] MRN: [id number] Date of Birth: [birth year]  Today's Date: 05/26/2019 OT Individual Time: 1100-1155 OT Individual Time Calculation (min): 55 min    Short Term Goals: Week 1:  OT Short Term Goal 1 (Week 1): patient will complete bed mobility with CS OT Short Term Goal 2 (Week 1): patient will complete sit pivot transfers with CS OT Short Term Goal 3 (Week 1): patient will complete LB dressing with min A using ADs  Skilled Therapeutic Interventions/Progress Updates:    Pt seen for OT session focusing on functional standing balance/endurance in prep for functional standing tasks and functional transfers. Pt sitting up in w/c upon arrival, agreeable to tx session and denying pain. Pt requesting to work on "leg strengthening" and denying bathing/dressing this session.  He self propelled w/c throughout session mod I with intermittent rest breaks required. He completed stand pivot transfers with RW throughout session with min A.  Sit>stand from EOM with CGA. Completed dynamic reaching activity from standing position with emphasis on upright standing posture and lessening UE reliance when in standing. Pt completed task with min A for balance, alternating UE support on RW. Pt standing ~45 seconds each trial before requiring seated rest break, completed x3 trials.  From standing position, completed toe tap activity having pt step on specific targets while standing at RW. Pt demonstrates good LE control, no ataxia noted. VCs to lessen UE reliance on RW during stands. Completed x4 trials with seated rest breaks btwn trials.  Pt transitioned back to w/c in same manner as described above. Back in  Pt's room, completed toilet transfer w/c>standard toilet. Used grab bar to assist to toilet, however, would be safer with B UE support during transfer. Therefore, used RW to return to w/c. Each transfer completed at overall min A with increased  time. Safety plan in pt's room changed from STEDY to RW due to pt progress. Pt opting to stay sitting up in w/c at end of session, all needs in reach.   Therapy Documentation Precautions:  Precautions Precautions: Fall, Back Precaution Comments: revirewed BLT percautions  Restrictions Weight Bearing Restrictions: No   Therapy/Group: Individual Therapy  Tian Mcmurtrey L 05/26/2019, 8:08 AM

## 2019-05-26 NOTE — Progress Notes (Addendum)
[facility] PHYSICAL MEDICINE & REHABILITATION PROGRESS NOTE   Subjective/Complaints: Pt without complaints. Slept well. Pain improving. Making progress with gait  ROS: Patient denies fever, rash, sore throat, blurred vision, nausea, vomiting, diarrhea, cough, shortness of breath or chest pain,  headache, or mood change.   Objective:   No results found. Recent Labs    05/25/19 0720  WBC 9.6  HGB 13.4  HCT 41.5  PLT 272   No results for input(s): NA, K, CL, CO2, GLUCOSE, BUN, CREATININE, CALCIUM in the last 72 hours.  Intake/Output Summary (Last 24 hours) at 05/26/2019 0852 Last data filed at 05/26/2019 0700 Gross per 24 hour  Intake 240 ml  Output 490 ml  Net -250 ml     Physical Exam: Vital Signs Blood pressure 130/87, pulse 94, temperature 98.3 F (36.8 C), temperature source Oral, resp. rate 16, height 5\' 10"  (1.778 m), weight (!) 136.4 kg, SpO2 97 %.  Constitutional: No distress . Vital signs reviewed. HEENT: EOMI, oral membranes moist Neck: supple Cardiovascular: RRR without murmur. No JVD    Respiratory: CTA Bilaterally without wheezes or rales. Normal effort    GI: BS +, non-tender, non-distended   Extremities: No clubbing, cyanosis, or edema Skin: lower back incision with minimal s/s discharge Neurologic: Cranial nerves II through XII intact, motor strength is 5/5 in bilateral deltoid, bicep, tricep, grip,3+. RLE: 2+/5 HF, KE, APF, 1/5 APF. LLE 3/5. Decreased LT, pain R>L LE below mid abdomen.   Musculoskeletal: Full range of motion in all 4 extremities. No joint swelling Psych: pleasant   Assessment/Plan: 1. Functional deficits secondary to thoracic myelopathy which require 3+ hours per day of interdisciplinary therapy in a comprehensive inpatient rehab setting.  Physiatrist is providing close team supervision and 24 hour management of active medical problems listed below.  Physiatrist and rehab team continue to assess barriers to discharge/monitor patient  progress toward functional and medical goals  Care Tool:  Bathing    Body parts bathed by patient: Right arm, Left arm, Chest, Abdomen, Right upper leg, Left upper leg, Face, Front perineal area, Left lower leg, Right lower leg, Buttocks   Body parts bathed by helper: Buttocks     Bathing assist Assist Level: Supervision/Verbal cueing(LH Sponge)     Upper Body Dressing/Undressing Upper body dressing   What is the patient wearing?: Pull over shirt    Upper body assist Assist Level: Set up assist    Lower Body Dressing/Undressing Lower body dressing      What is the patient wearing?: Pants     Lower body assist Assist for lower body dressing: Minimal Assistance - Patient > 75%     Toileting Toileting    Toileting assist Assist for toileting: Independent with assistive device Assistive Device Comment: (Urinal)   Transfers Chair/bed transfer  Transfers assist     Chair/bed transfer assist level: Moderate Assistance - Patient 50 - 74%     Locomotion Ambulation   Ambulation assist   Ambulation activity did not occur: Safety/medical concerns          Walk 10 feet activity   Assist  Walk 10 feet activity did not occur: Safety/medical concerns        Walk 50 feet activity   Assist Walk 50 feet with 2 turns activity did not occur: Safety/medical concerns         Walk 150 feet activity   Assist Walk 150 feet activity did not occur: Safety/medical concerns  Walk 10 feet on uneven surface  activity   Assist Walk 10 feet on uneven surfaces activity did not occur: Safety/medical concerns         Wheelchair     Assist Will patient use wheelchair at discharge?: Yes Type of Wheelchair: Manual    Wheelchair assist level: Supervision/Verbal cueing Max wheelchair distance: 200'    Wheelchair 50 feet with 2 turns activity    Assist        Assist Level: Supervision/Verbal cueing   Wheelchair 150 feet activity      Assist     Assist Level: Supervision/Verbal cueing    Medical Problem List and Plan:  1.Progressive quadriparesissecondary to thoracic lumbar myelopathy status post T5-6 laminectomy, T11-12 laminectomy, L3-4 laminectomy 05/14/2019 as well as history of prior cervical discectomy and thoracic laminectomy in the past. No brace required  -continue CIR therapies today including PT and OT   -team conf 2. Antithrombotics: -DVT/anticoagulation:SCDs.Chronic Coumadin resumed   -antiplatelet therapy: N/A 3. Pain Management:Neurontin 300 mg 3 times daily, Flexeril and oxycodone as needed 4. Mood:Provide emotional support -antipsychotic agents: N/A 5. Neuropsych: This patientiscapable of making decisions on hisown behalf. 6. Skin/Wound Care:lower incision still with drainage  -continue dressing 7. Fluids/Electrolytes/Nutrition:encourage PO     8.History of clotting disorder pulmonary emboli DVT. coumadin resumed 9.Hypertension. Toprol-XL 50 mg daily, Lasix 40 mg daily.  -DBP improved today  -consider med adjustment if persistently elevated 10. Constipation/neurogenic bowel:. moved bowels   -regular bowel program initiated 11. Low grade temp, leukocytosis: afebrile, leukocytosis resolved  -UA negative, ucx negative. Emptying well at present 12. Morbid Obesity      LOS: 5 days A FACE TO FACE EVALUATION WAS PERFORMED  Nicolas [name] 05/26/2019, 8:52 AM

## 2019-05-26 NOTE — Progress Notes (Signed)
ANTICOAGULATION CONSULT NOTE - Follow Up Consult  Pharmacy Consult for Warfarin Indication: h/o recurrent VTE   Allergies  Allergen Reactions  . Ace Inhibitors Other (See Comments)    cough  . Lisinopril Other (See Comments)    cough    Patient Measurements: Height: 5\' 10"  (177.8 cm) Weight: (!) 300 lb 11.3 oz (136.4 kg) IBW/kg (Calculated) : 73   Vital Signs: Temp: 98.3 F (36.8 C) (06/30 0503) Temp Source: Oral (06/30 0503) BP: 130/87 (06/30 0503) Pulse Rate: 94 (06/30 0503)  Labs: Recent Labs    05/24/19 0750 05/25/19 0720 05/26/19 0627  HGB  --  13.4  --   HCT  --  41.5  --   PLT  --  272  --   LABPROT 21.6* 23.8* 26.4*  INR 1.9* 2.2* 2.5*    Estimated Creatinine Clearance: 133.6 mL/min (by C-G formula based on SCr of 0.9 mg/dL).   Assessment:  Anticoag: Coumadin for hx recurrent VTE. INR 2.2>2.5. Hgb 13.4. WNL. Plts WNL. *home dose: 5mg  daily except 10mg  on Tues/Thurs/Sun  Goal of Therapy:  INR 2-3 Monitor platelets by anticoagulation protocol: Yes   Plan:  Coumadin 5mg  today. Could likely resume home regimen. Daily PT/INR  Walker Sitar S. Alford Highland, PharmD, [place] Clinical Staff Pharmacist Eilene Ghazi Stillinger 05/26/2019,1:34 PM

## 2019-05-26 NOTE — Care Management (Signed)
[facility] Individual Statement of Services  Patient Name:  Nicolas [name]  Date:  05/26/2019  Welcome to the [facility].  Our goal is to provide you with an individualized program based on your diagnosis and situation, designed to meet your specific needs.  With this comprehensive rehabilitation program, you will be expected to participate in at least 3 hours of rehabilitation therapies Monday-Friday, with modified therapy programming on the weekends.  Your rehabilitation program will include the following services:  Physical Therapy (PT), Occupational Therapy (OT), 24 hour per day rehabilitation nursing, Therapeutic Recreaction (TR), Neuropsychology, Case Management (Social Worker), Rehabilitation Medicine, Nutrition Services and Pharmacy Services  Weekly team conferences will be held on Tuesdays to discuss your progress.  Your Social Worker will talk with you frequently to get your input and to update you on team discussions.  Team conferences with you and your family in attendance may also be held.  Expected length of stay: 10-12 days   Overall anticipated outcome: contact guard  Depending on your progress and recovery, your program may change. Your Social Worker will coordinate services and will keep you informed of any changes. Your Social Worker's name and contact numbers are listed  below.  The following services may also be recommended but are not provided by the [facility] will be made to provide these services after discharge if needed.  Arrangements include referral to agencies that provide these services.  Your insurance has been verified to be:  [employer] Your primary doctor is:  [name]  Pertinent information will be shared with your doctor and your insurance company.  Social Worker:  [place], [place] or ([phone]   Information discussed with and copy given to patient by: Lennart Pall, 05/26/2019, 11:29 AM

## 2019-05-26 NOTE — Plan of Care (Signed)
  Problem: Consults Goal: RH SPINAL CORD INJURY PATIENT EDUCATION Description:  See Patient Education module for education specifics.  Outcome: Progressing Goal: Skin Care Protocol Initiated - if Braden Score 18 or less Description: If consults are not indicated, leave blank or document N/A Outcome: Progressing   Problem: RH SKIN INTEGRITY Goal: RH STG SKIN FREE OF INFECTION/BREAKDOWN Description: Skin to remain free from infection and breakdown with mod assist. Outcome: Progressing Goal: RH STG MAINTAIN SKIN INTEGRITY WITH ASSISTANCE Description: STG Maintain Skin Integrity With mod Assistance. Outcome: Progressing Goal: RH STG ABLE TO PERFORM INCISION/WOUND CARE W/ASSISTANCE Description: STG Able To Perform Incision/Wound Care With mod Assistance. Outcome: Progressing   Problem: RH SAFETY Goal: RH STG ADHERE TO SAFETY PRECAUTIONS W/ASSISTANCE/DEVICE Description: STG Adhere to Safety Precautions With mod Assistance and appropriate assistive Device. Outcome: Progressing   Problem: RH PAIN MANAGEMENT Goal: RH STG PAIN MANAGED AT OR BELOW PT'S PAIN GOAL Description: <3 on a 0-10 pain scale. Outcome: Progressing   Problem: RH KNOWLEDGE DEFICIT SCI Goal: RH STG INCREASE KNOWLEDGE OF SELF CARE AFTER SCI Outcome: Progressing

## 2019-05-27 ENCOUNTER — Inpatient Hospital Stay (HOSPITAL_COMMUNITY): Payer: Medicare HMO | Admitting: Occupational Therapy

## 2019-05-27 ENCOUNTER — Inpatient Hospital Stay (HOSPITAL_COMMUNITY): Payer: Medicare HMO | Admitting: Physical Therapy

## 2019-05-27 LAB — GLUCOSE, CAPILLARY
Glucose-Capillary: 171 mg/dL — ABNORMAL HIGH (ref 70–99)
Glucose-Capillary: 172 mg/dL — ABNORMAL HIGH (ref 70–99)
Glucose-Capillary: 199 mg/dL — ABNORMAL HIGH (ref 70–99)

## 2019-05-27 LAB — PROTIME-INR
INR: 2.6 — ABNORMAL HIGH (ref 0.8–1.2)
Prothrombin Time: 27.1 seconds — ABNORMAL HIGH (ref 11.4–15.2)

## 2019-05-27 MED ORDER — WARFARIN SODIUM 5 MG PO TABS
10.0000 mg | ORAL_TABLET | ORAL | Status: DC
  Administered 2019-05-28 – 2019-05-31 (×2): 10 mg via ORAL
  Filled 2019-05-27 (×2): qty 2

## 2019-05-27 MED ORDER — WARFARIN SODIUM 5 MG PO TABS
5.0000 mg | ORAL_TABLET | ORAL | Status: DC
  Administered 2019-05-27 – 2019-05-30 (×3): 5 mg via ORAL
  Filled 2019-05-27 (×3): qty 1

## 2019-05-27 MED ORDER — SORBITOL 70 % SOLN
60.0000 mL | Status: AC
  Administered 2019-05-27: 60 mL via ORAL
  Filled 2019-05-27: qty 60

## 2019-05-27 NOTE — Progress Notes (Addendum)
ANTICOAGULATION CONSULT NOTE - Follow Up Consult  Pharmacy Consult for Warfarin Indication: h/o recurrent VTE   Allergies  Allergen Reactions  . Ace Inhibitors Other (See Comments)    cough  . Lisinopril Other (See Comments)    cough    Patient Measurements: Height: 5\' 10"  (177.8 cm) Weight: (!) 300 lb 11.3 oz (136.4 kg) IBW/kg (Calculated) : 73   Vital Signs: Temp: 98.2 F (36.8 C) (07/01 0531) BP: 149/85 (07/01 0531) Pulse Rate: 96 (07/01 0531)  Labs: Recent Labs    05/25/19 0720 05/26/19 0627 05/27/19 0616  HGB 13.4  --   --   HCT 41.5  --   --   PLT 272  --   --   LABPROT 23.8* 26.4* 27.1*  INR 2.2* 2.5* 2.6*    Estimated Creatinine Clearance: 133.6 mL/min (by C-G formula based on SCr of 0.9 mg/dL).   Assessment:   Anticoag: Coumadin for hx recurrent VTE. INR 2.6. Hgb 13.4. WNL. Plts WNL. *home dose: 5mg  daily except 10mg  on Tues/Thurs/Sun  Goal of Therapy:  INR 2-3 Monitor platelets by anticoagulation protocol: Yes   Plan:  Coumadin 5mg  daily except 10mg  on Tues/Thurs/Sun MWF PT/INR  Nicolas [name] S. Alford Highland, PharmD, [place] Clinical Staff Pharmacist Eilene Ghazi Stillinger 05/27/2019,1:10 PM

## 2019-05-27 NOTE — Progress Notes (Signed)
Orthopedic Tech Progress Note Patient Details:  Nicolas [name] 02/15/1966 [id number]  Ortho Devices Type of Ortho Device: Prafo boot/shoe Ortho Device/Splint Location: LRE Ortho Device/Splint Interventions: Adjustment, Application   Post Interventions Patient Tolerated: Well Instructions Provided: Care of device, Adjustment of device   Janit Pagan 05/27/2019, 9:21 AM

## 2019-05-27 NOTE — Progress Notes (Signed)
[facility] PHYSICAL MEDICINE & REHABILITATION PROGRESS NOTE   Subjective/Complaints:   Had a good night. Happy with therapy progress. Is constipated.    ROS: Patient denies fever, rash, sore throat, blurred vision, nausea, vomiting, diarrhea, cough, shortness of breath or chest pain,  headache, or mood change.   Objective:   No results found. Recent Labs    05/25/19 0720  WBC 9.6  HGB 13.4  HCT 41.5  PLT 272   No results for input(s): NA, K, CL, CO2, GLUCOSE, BUN, CREATININE, CALCIUM in the last 72 hours.  Intake/Output Summary (Last 24 hours) at 05/27/2019 0823 Last data filed at 05/26/2019 2317 Gross per 24 hour  Intake 120 ml  Output 250 ml  Net -130 ml     Physical Exam: Vital Signs Blood pressure (!) 149/85, pulse 96, temperature 98.2 F (36.8 C), resp. rate 18, height 5\' 10"  (1.778 m), weight (!) 136.4 kg, SpO2 96 %.  Constitutional: No distress . Vital signs reviewed. HEENT: EOMI, oral membranes moist Neck: supple Cardiovascular: RRR without murmur. No JVD    Respiratory: CTA Bilaterally without wheezes or rales. Normal effort    GI: BS +, non-tender, non-distended  Extremities: No clubbing, cyanosis, or edema Skin: lower back incision with little dc Neurologic: Cranial nerves II through XII intact, motor strength is 5/5 in bilateral deltoid, bicep, tricep, grip,3+. RLE: 2+/5 HF, KE, APF, 1/5 APF. LLE 3/5. Decreased LT, pain R>L LE below mid abdomen.   Musculoskeletal: right heel cord tight Psych: pleasant   Assessment/Plan: 1. Functional deficits secondary to thoracic myelopathy which require 3+ hours per day of interdisciplinary therapy in a comprehensive inpatient rehab setting.  Physiatrist is providing close team supervision and 24 hour management of active medical problems listed below.  Physiatrist and rehab team continue to assess barriers to discharge/monitor patient progress toward functional and medical goals  Care Tool:  Bathing    Body parts  bathed by patient: Right arm, Left arm, Chest, Abdomen, Right upper leg, Left upper leg, Face, Front perineal area, Left lower leg, Right lower leg, Buttocks   Body parts bathed by helper: Buttocks     Bathing assist Assist Level: Supervision/Verbal cueing(LH Sponge)     Upper Body Dressing/Undressing Upper body dressing   What is the patient wearing?: Pull over shirt    Upper body assist Assist Level: Set up assist    Lower Body Dressing/Undressing Lower body dressing      What is the patient wearing?: Pants     Lower body assist Assist for lower body dressing: Minimal Assistance - Patient > 75%     Toileting Toileting    Toileting assist Assist for toileting: Independent with assistive device Assistive Device Comment: (Urinal)   Transfers Chair/bed transfer  Transfers assist     Chair/bed transfer assist level: Minimal Assistance - Patient > 75%     Locomotion Ambulation   Ambulation assist   Ambulation activity did not occur: Safety/medical concerns  Assist level: 2 helpers Assistive device: Walker-rolling Max distance: 15'   Walk 10 feet activity   Assist  Walk 10 feet activity did not occur: Safety/medical concerns  Assist level: 2 helpers Assistive device: Walker-rolling   Walk 50 feet activity   Assist Walk 50 feet with 2 turns activity did not occur: Safety/medical concerns         Walk 150 feet activity   Assist Walk 150 feet activity did not occur: Safety/medical concerns         Walk 10  feet on uneven surface  activity   Assist Walk 10 feet on uneven surfaces activity did not occur: Safety/medical concerns         Wheelchair     Assist Will patient use wheelchair at discharge?: Yes Type of Wheelchair: Manual    Wheelchair assist level: Supervision/Verbal cueing Max wheelchair distance: 150'    Wheelchair 50 feet with 2 turns activity    Assist        Assist Level: Supervision/Verbal cueing    Wheelchair 150 feet activity     Assist     Assist Level: Supervision/Verbal cueing    Medical Problem List and Plan:  1.Progressive quadriparesissecondary to thoracic lumbar myelopathy status post T5-6 laminectomy, T11-12 laminectomy, L3-4 laminectomy 05/14/2019 as well as history of prior cervical discectomy and thoracic laminectomy in the past. No brace required  -continue CIR therapies today including PT and OT    -order PRAFO RLE to help stretch heel cord 2. Antithrombotics: -DVT/anticoagulation:SCDs.Chronic Coumadin resumed   -antiplatelet therapy: N/A 3. Pain Management:Neurontin 300 mg 3 times daily, Flexeril and oxycodone as needed 4. Mood:Provide emotional support -antipsychotic agents: N/A 5. Neuropsych: This patientiscapable of making decisions on hisown behalf. 6. Skin/Wound Care:lower incision still with drainage  -continue dressing to keep dry 7. Fluids/Electrolytes/Nutrition:encourage PO     8.History of clotting disorder pulmonary emboli DVT. coumadin resumed 9.Hypertension. Toprol-XL 50 mg daily, Lasix 40 mg daily.  -DBP improved today  -consider med adjustment if persistently elevated 10. Constipation/neurogenic bowel:. sorbitol and sse if needed today  -increase his standard bowel program 11. Low grade temp, leukocytosis: afebrile, leukocytosis resolved  -UA negative, ucx negative. Emptying well at present 12. Morbid Obesity      LOS: 6 days A FACE TO FACE EVALUATION WAS PERFORMED  Meredith Staggers 05/27/2019, 8:23 AM

## 2019-05-27 NOTE — Progress Notes (Signed)
Occupational Therapy Session Note  Patient Details  Name: Nicolas [name] MRN: [id number] Date of Birth: 02-Jun-1966  Today's Date: 05/27/2019 OT Individual Time: 1400-1500 OT Individual Time Calculation (min): 60 min    Short Term Goals: Week 1:  OT Short Term Goal 1 (Week 1): patient will complete bed mobility with CS OT Short Term Goal 2 (Week 1): patient will complete sit pivot transfers with CS OT Short Term Goal 3 (Week 1): patient will complete LB dressing with min A using ADs  Skilled Therapeutic Interventions/Progress Updates:    Pt seen for OT session focusing on sit>stand, functional standing balance/endurance, and functional transfers. Pt in supine upon arrival, voices "feeling better" compared to AM and willing to cont with therapy without intervention. He transferred to sitting EOB with supervision using hospital bed functions. Throught session, he completed stand pivot transfers with RW and CGA, assist to stabilze equipment due to heavy reliance on UEs.  He self propelled w/c throughout unit mod I. In ADL apartment, he completed sit>stand from w/c at kitchen counter. Pt reports at home, he used power w/c primarily in kitchen, however, will stand without AD at kitchen counter in order to obtain items or to work on standing endurance. He completed multiple sit>stands intially at sink progressing to flat counter top with min A fading to CGA. Pt able to retrieve and replace items from overhead cabinet with close supervision. He then completed hand hygiene in standing, able to alternate UE support. Pt able to tolerate standing ~30-45 seconds before requiring seated rest break. Following 4th standing trial, pt voiced urgent need to void. Returned to room and completed stand pivot to toilet. Continent BM, assist for clothing management 2/2 urgency. Pt able to use toilet aid to complete hygiene with set-up assist. He returned to w/c with min A and completed hand hygiene from w/c level mod I. Pt  requesting to use Kinetron at end of session, completed 5 minutes from w/c level with intermittent self-initiated rest breaks throughout. Pt returned to room at end of session, desiring to stay up in w/c. Left seated with all needs in reach. Throughout session, education provided regarding modified ADL/IADLs, energy conservation, incorporating exercises into functional tasks and d/c planning.   Therapy Documentation Precautions:  Precautions Precautions: Fall, Back Precaution Comments: revirewed BLT percautions  Restrictions Weight Bearing Restrictions: No   Therapy/Group: Individual Therapy  Kyleigha Markert L 05/27/2019, 7:10 AM

## 2019-05-27 NOTE — Plan of Care (Signed)
  Problem: Consults Goal: RH SPINAL CORD INJURY PATIENT EDUCATION Description:  See Patient Education module for education specifics.  Outcome: Progressing   Problem: RH SKIN INTEGRITY Goal: RH STG MAINTAIN SKIN INTEGRITY WITH ASSISTANCE Description: STG Maintain Skin Integrity With mod Assistance. Outcome: Progressing   Problem: RH SAFETY Goal: RH STG ADHERE TO SAFETY PRECAUTIONS W/ASSISTANCE/DEVICE Description: STG Adhere to Safety Precautions With mod Assistance and appropriate assistive Device. Outcome: Progressing   Problem: RH PAIN MANAGEMENT Goal: RH STG PAIN MANAGED AT OR BELOW PT'S PAIN GOAL Description: <3 on a 0-10 pain scale. Outcome: Not Progressing   Problem: RH KNOWLEDGE DEFICIT SCI Goal: RH STG INCREASE KNOWLEDGE OF SELF CARE AFTER SCI Outcome: Progressing

## 2019-05-27 NOTE — Patient Care Conference (Signed)
Inpatient RehabilitationTeam Conference and Plan of Care Update Date: 05/26/2019   Time: 2:05 PM    Patient Name: Nicolas [name]      Medical Record Number: [id number]  Date of Birth: [birth year] Sex: Male         Room/Bed: 1B51W/2H85I-77 Payor Info: Payor: [employer] MEDICARE / Plan: [employer] MEDICARE HMO / Product Type: *No Product type* /    Admitting Diagnosis: 1. SCI team [facility]SC[facility], thoracic myelopathy, 16-18 days  Admit Date/Time:  05/21/2019  5:35 PM Admission Comments: No comment available   Primary Diagnosis:  <principal problem not specified> Principal Problem: <principal problem not specified>  Patient Active Problem List   Diagnosis Date Noted  . Thoracic myelopathy 05/14/2019  . Cervical spondylosis with myelopathy 01/26/2019  . Class 3 severe obesity due to excess calories with serious comorbidity and body mass index (BMI) of 40.0 to 44.9 in adult ([place]) 10/10/2017  . Prediabetes 10/10/2017  . Debility 10/10/2017  . Able to mobilize using indoor motorized wheelchair 10/10/2017  . Frequent falls 02/01/2017  . At high risk for falls 03/12/2016  . Hypoxemia 05/23/2014  . Chronic anticoagulation 05/21/2014  . Spinal stenosis 12/02/2013  . Venous stasis dermatitis 09/26/2012  . Encounter for long-term (current) use of anticoagulants 03/26/2012  . Spinal stenosis of lumbar region 03/06/2012  . HTN (hypertension) 03/06/2012  . Hx pulmonary embolism 03/06/2012  . Lower extremity edema 03/06/2012    Expected Discharge Date: Expected Discharge Date: 06/03/19  Team Members Present: Physician leading conference: Dr. Alger Simons Social Worker Present: Lennart Pall, LCSW Nurse Present: Dwaine Gale, RN PT Present: Excell Seltzer, PT OT Present: Amy Rounds, OT SLP Present: Stormy Fabian, SLP PPS Coordinator present : Gunnar Fusi, SLP     Current Status/Progress Goal Weekly Team Focus  Medical   acute on chronic myelopathy, s/p multi-part laminectomy. neurogenic bowel  improve  functional mobilit  wound care, bowel mgt, pain control   Bowel/Bladder   continent bowel/bladder. LBM 05/23/2019  Remain continent b/b  assess toileting needs qshift/prn   Swallow/Nutrition/ Hydration             ADL's   Mod A stand pivot transfers, mod A LB dressing, supervision UB/LB bathing using AE, mod A toileting  CGA standing balance and shower transfers, supervision toileting and toilet transfers, mod I ADLs from w/c level  Functional transfers, functional standing balance/endurance, ADL/IADL re-training   Mobility   Supervision bed mobility, mod to max A sit to stand and SPT with RW, Supervision w/c mobility, able to take a few steps with RW mod A  mod I at w/c level, Supervision transfers, min A short distance gait with RW  BLE NMR, standing, initiating gait   Communication             Safety/Cognition/ Behavioral Observations            Pain   5/10 mid lower back pain. Oxycodone given at 2348  2/10  assess pain qshift/prn, medicate prn   Skin   surgical incision back, abd pad dressings in place,  remain intact, free from infection and breakdown  assess surgical incision q shift & prn    Rehab Goals Patient on target to meet rehab goals: Yes *See Care Plan and progress notes for long and short-term goals.     Barriers to Discharge  Current Status/Progress Possible Resolutions Date Resolved   Physician    Medical stability        see medical progress list  Nursing                  PT                    OT                  SLP                SW                Discharge Planning/Teaching Needs:  Pt to d/c home with 38 yo son who can provide close to 24/7 assistance.  Other family in area can also assist if pt requires 24/7.  Teaching needs TBD   Team Discussion:  Chronic myelopathy; multi level lami.  Medically doing well.  Min assist stand-pivot and min b/d.  Min tfs with rw and amb 15' min/ mod.  Plan to upgrade several goals.  Mod ind overall  expected.  Revisions to Treatment Plan:  NA    Continued Need for Acute Rehabilitation Level of Care: The patient requires daily medical management by a physician with specialized training in physical medicine and rehabilitation for the following conditions: Daily direction of a multidisciplinary physical rehabilitation program to ensure safe treatment while eliciting the highest outcome that is of practical value to the patient.: Yes Daily medical management of patient stability for increased activity during participation in an intensive rehabilitation regime.: Yes Daily analysis of laboratory values and/or radiology reports with any subsequent need for medication adjustment of medical intervention for : Neurological problems;Post surgical problems;Wound care problems   I attest that I was present, lead the team conference, and concur with the assessment and plan of the team.   Lennart Pall 05/27/2019, 12:28 PM   Team conference was held via web/ teleconference due to [place] - 19

## 2019-05-27 NOTE — Progress Notes (Signed)
Social Work Patient ID: Nicolas [name], male   DOB: Apr 18, 1966, 53 y.o.   MRN: [id number]  Met with pt yesterday to review team conference.  Pt aware and agreeable with targeted d/c date of 7/8 and mod ind goals overall.  Feels he is making good progress and denies any concerns at this time.  May need to plan for family ed next week - will await recs from therapies.  Argusta Mcgann, LCSW

## 2019-05-27 NOTE — Progress Notes (Signed)
Occupational Therapy Session Note  Patient Details  Name: Nicolas [name] MRN: [id number] Date of Birth: 08-23-1966  Today's Date: 05/27/2019 OT Individual Time: 6659-9357 OT Individual Time Calculation (min): 60 min    Short Term Goals: Week 1:  OT Short Term Goal 1 (Week 1): patient will complete bed mobility with CS OT Short Term Goal 2 (Week 1): patient will complete sit pivot transfers with CS OT Short Term Goal 3 (Week 1): patient will complete LB dressing with min A using ADs  Skilled Therapeutic Interventions/Progress Updates:    Treatment session with focus on ADL retraining and functional transfers. Pt received supine in bed reporting feeling "like crap" however agreeable to attempt therapy session.  Discussed pain in back, notified RN who provided pain meds during session, and various strategies to decrease pain other than pain meds.  Pt agreeable to shower.  Pt completed bed mobility supervision and completed stand step transfer to w/c Min assist with RW.  Pt completed stand step transfers with RW w/c <> tub bench.  Completed bathing with supervision with use of long handled sponge and lateral leans to wash buttocks.  Engaged in dressing from EOB with pt utilizing modified circle sitting and use of reacher when donning shorts and even donning TEDS and shoes this session.  Min assist sit > stand when pulling pants over hips, cues to alternate UE support when pulling up pants.  Pt reports pain decreasing from 8 to 5 post shower and pain meds.  Returned to supine and left with all needs in reach.  Therapy Documentation Precautions:  Precautions Precautions: Fall, Back Precaution Comments: revirewed BLT percautions  Restrictions Weight Bearing Restrictions: No Pain: Pain Assessment Pain Scale: 0-10 Pain Score: 8  Pain Type: Surgical pain Pain Location: Back Pain Orientation: Lower Pain Descriptors / Indicators: Aching;Discomfort Pain Frequency: Intermittent Pain Onset:  Gradual Pain Intervention(s): Medication (See eMAR);Emotional support   Therapy/Group: Individual Therapy  Simonne Come 05/27/2019, 12:31 PM

## 2019-05-27 NOTE — Progress Notes (Signed)
Physical Therapy Session Note  Patient Details  Name: Nicolas [name] MRN: [id number] Date of Birth: 03/28/1966  Today's Date: 05/27/2019 PT Individual Time: 0800-0900 PT Individual Time Calculation (min): 60 min   Short Term Goals: Week 1:  PT Short Term Goal 1 (Week 1): Pt will complete least restrictive transfer with min A PT Short Term Goal 2 (Week 1): Pt will initiate standing PT Short Term Goal 2 - Progress (Week 1): Met PT Short Term Goal 3 (Week 1): Pt will complete bed mobility with min A  Skilled Therapeutic Interventions/Progress Updates:    Pt received seated in bed, agreeable to PT session. No complaints of pain at rest but does have onset of low back and abdominal pain once transferring, believes it is related to sidelying position he slept in last night. Pain increases throughout session and pt then attributes it to being constipated. Pt is Supervision for bed mobility with use of bedrail and HOB elevated. Sit to stand with min A to RW, mod A to pull up pants in standing and setup A to don shirt while seated EOB. Stand pivot transfer bed to w/c with RW and CGA. Manual w/c propulsion x 150 ft with use of BUE at mod I, independent for management of w/c parts. Sit to stand x 3 reps to RW with min A with focus on decreased BUE support in standing. Pt has ongoing back pain with transfers. Sit to supine Supervision, attempt to have pt perform supine therex but unable to tolerate supine positioning on therapy mat. Returned to sitting with Supervision. Pt reports urge to have a BM. Toilet transfer with RW and mod A, min A for clothing management. Pt unsuccessful with BM and only able to produce gas. Assisted pt back to bed at end of session, min A for SPT. Sit to supine Supervision. Provided hot pack to low back for pain relief, RN notified of symptoms and that pt unable to have a BM. Pt left semi-reclined in bed with needs in reach, bed alarm in place at end of session.  Therapy  Documentation Precautions:  Precautions Precautions: Fall, Back Precaution Comments: revirewed BLT percautions  Restrictions Weight Bearing Restrictions: No    Therapy/Group: Individual Therapy   Excell Seltzer, PT, DPT  05/27/2019, 12:08 PM

## 2019-05-28 ENCOUNTER — Inpatient Hospital Stay (HOSPITAL_COMMUNITY): Payer: Medicare HMO

## 2019-05-28 ENCOUNTER — Inpatient Hospital Stay (HOSPITAL_COMMUNITY): Payer: Medicare HMO | Admitting: Occupational Therapy

## 2019-05-28 ENCOUNTER — Inpatient Hospital Stay (HOSPITAL_COMMUNITY): Payer: Medicare HMO | Admitting: Physical Therapy

## 2019-05-28 LAB — GLUCOSE, CAPILLARY
Glucose-Capillary: 157 mg/dL — ABNORMAL HIGH (ref 70–99)
Glucose-Capillary: 150 mg/dL — ABNORMAL HIGH (ref 70–99)
Glucose-Capillary: 160 mg/dL — ABNORMAL HIGH (ref 70–99)
Glucose-Capillary: 150 mg/dL — ABNORMAL HIGH (ref 70–99)
Glucose-Capillary: 198 mg/dL — ABNORMAL HIGH (ref 70–99)

## 2019-05-28 NOTE — Progress Notes (Signed)
Occupational Therapy Session Note  Patient Details  Name: Nicolas [name] MRN: [id number] Date of Birth: 1966-10-14  Today's Date: 05/28/2019 OT Individual Time: 0321-2248 OT Individual Time Calculation (min): 70 min    Short Term Goals: Week 1:  OT Short Term Goal 1 (Week 1): patient will complete bed mobility with CS OT Short Term Goal 2 (Week 1): patient will complete sit pivot transfers with CS OT Short Term Goal 3 (Week 1): patient will complete LB dressing with min A using ADs Week 2:     Skilled Therapeutic Interventions/Progress Updates:    1:1 SElf care retraining at shower level. Pt able to perform bed mobility to come to EOB with supervision with bed rail. Pt reports using a trapeze bar at home to pull up on as needed. Pt performed min A transfers with regular RW today ( compared to wide RW- was not available for session) bed to w/c to and from shower bench- facing outward.  Pt continues to use toilet aid he was using prior to admission to wash bottom in shower with later leans.  Dressed sit to stand at w/c level with RW with min A for standing balance and max A for donning shoes and TEDS.   Pt self propelled to dayroom for requested strengthening of LEs on kinetron. Pt perform 1 min with harder intensity, rest, 30 seconds of easy intensity, rest and then hard intensity for 1 min - repeating cycle three more times.   Therapy Documentation Precautions:  Precautions Precautions: Fall, Back Precaution Comments: revirewed BLT percautions  Restrictions Weight Bearing Restrictions: No General:   Vital Signs: Therapy Vitals Temp: 97.8 F (36.6 C) Pulse Rate: 96 Resp: 18 BP: (!) 146/78 Patient Position (if appropriate): Lying Oxygen Therapy SpO2: 95 % O2 Device: Room Air Pain: No c/o this session and reported he has premedicated  Therapy/Group: Individual Therapy  Willeen Cass [facility] 05/28/2019, 9:29 AM

## 2019-05-28 NOTE — Progress Notes (Signed)
[place] PHYSICAL MEDICINE & REHABILITATION PROGRESS NOTE   Subjective/Complaints:  had a good night. Had bms yesterday  ROS: Patient denies fever, rash, sore throat, blurred vision, nausea, vomiting, diarrhea, cough, shortness of breath or chest pain, joint or back pain, headache, or mood change.   Objective:   No results found. No results for input(s): WBC, HGB, HCT, PLT in the last 72 hours. No results for input(s): NA, K, CL, CO2, GLUCOSE, BUN, CREATININE, CALCIUM in the last 72 hours.  Intake/Output Summary (Last 24 hours) at 05/28/2019 0951 Last data filed at 05/27/2019 2100 Gross per 24 hour  Intake 240 ml  Output 650 ml  Net -410 ml     Physical Exam: Vital Signs Blood pressure (!) 146/78, pulse 96, temperature 97.8 F (36.6 C), resp. rate 18, height 5\' 10"  (1.778 m), weight (!) 136.4 kg, SpO2 95 %.  Constitutional: No distress . Vital signs reviewed. HEENT: EOMI, oral membranes moist Neck: supple Cardiovascular: RRR without murmur. No JVD    Respiratory: CTA Bilaterally without wheezes or rales. Normal effort    GI: BS +, non-tender, non-distended  Extremities: No clubbing, cyanosis, or edema Skin: lower back incision drying up Neurologic: Cranial nerves II through XII intact, motor strength is 5/5 in bilateral deltoid, bicep, tricep, grip,3+. RLE: 2+/5 HF, KE, APF, 1/5 APF. LLE 3/5. Decreased LT, pain R>L LE below mid abdomen.   Musculoskeletal: right heel cord still tight Psych: pleasant   Assessment/Plan: 1. Functional deficits secondary to thoracic myelopathy which require 3+ hours per day of interdisciplinary therapy in a comprehensive inpatient rehab setting.  Physiatrist is providing close team supervision and 24 hour management of active medical problems listed below.  Physiatrist and rehab team continue to assess barriers to discharge/monitor patient progress toward functional and medical goals  Care Tool:  Bathing    Body parts bathed by patient:  Right arm, Left arm, Chest, Abdomen, Right upper leg, Left upper leg, Face, Front perineal area, Left lower leg, Right lower leg, Buttocks   Body parts bathed by helper: Buttocks     Bathing assist Assist Level: Set up assist     Upper Body Dressing/Undressing Upper body dressing   What is the patient wearing?: Pull over shirt    Upper body assist Assist Level: Set up assist    Lower Body Dressing/Undressing Lower body dressing      What is the patient wearing?: Pants     Lower body assist Assist for lower body dressing: Minimal Assistance - Patient > 75%     Toileting Toileting    Toileting assist Assist for toileting: Minimal Assistance - Patient > 75% Assistive Device Comment: (Urinal)   Transfers Chair/bed transfer  Transfers assist     Chair/bed transfer assist level: Minimal Assistance - Patient > 75%     Locomotion Ambulation   Ambulation assist   Ambulation activity did not occur: Safety/medical concerns  Assist level: 2 helpers Assistive device: Walker-rolling Max distance: 15'   Walk 10 feet activity   Assist  Walk 10 feet activity did not occur: Safety/medical concerns  Assist level: 2 helpers Assistive device: Walker-rolling   Walk 50 feet activity   Assist Walk 50 feet with 2 turns activity did not occur: Safety/medical concerns         Walk 150 feet activity   Assist Walk 150 feet activity did not occur: Safety/medical concerns         Walk 10 feet on uneven surface  activity   Assist  Walk 10 feet on uneven surfaces activity did not occur: Safety/medical concerns         Wheelchair     Assist Will patient use wheelchair at discharge?: Yes Type of Wheelchair: Manual    Wheelchair assist level: Independent Max wheelchair distance: 150'    Wheelchair 50 feet with 2 turns activity    Assist        Assist Level: Independent   Wheelchair 150 feet activity     Assist     Assist Level:  Independent    Medical Problem List and Plan:  1.Progressive quadriparesissecondary to thoracic lumbar myelopathy status post T5-6 laminectomy, T11-12 laminectomy, L3-4 laminectomy 05/14/2019 as well as history of prior cervical discectomy and thoracic laminectomy in the past. No brace required  -continue CIR therapies today including PT and OT    - PRAFO RLE to help stretch heel cord 2. Antithrombotics: -DVT/anticoagulation:SCDs.Chronic Coumadin resumed   -antiplatelet therapy: N/A 3. Pain Management:Neurontin 300 mg 3 times daily, Flexeril and oxycodone as needed 4. Mood:Provide emotional support -antipsychotic agents: N/A 5. Neuropsych: This patientiscapable of making decisions on hisown behalf. 6. Skin/Wound Care:lower incision still with drainage  -continue dressing to keep dry 7. Fluids/Electrolytes/Nutrition:encourage PO     8.History of clotting disorder pulmonary emboli DVT. coumadin resumed 9.Hypertension. Toprol-XL 50 mg daily, Lasix 40 mg daily.  -DBP improved    -no changes at present 10. Constipation/neurogenic bowel:. had bm yesterday  -increased his standard bowel program 11. Low grade temp, leukocytosis: afebrile, leukocytosis resolved  -UA negative, ucx negative.   12. Morbid Obesity      LOS: 7 days A FACE TO FACE EVALUATION WAS PERFORMED  Meredith Staggers 05/28/2019, 9:51 AM

## 2019-05-28 NOTE — Progress Notes (Signed)
Occupational Therapy Session Note  Patient Details  Name: Nicolas [name] MRN: [id number] Date of Birth: 02-12-1966  Today's Date: 05/28/2019 OT Individual Time: 1230-1313 OT Individual Time Calculation (min): 43 min    Short Term Goals: Week 1:  OT Short Term Goal 1 (Week 1): patient will complete bed mobility with CS OT Short Term Goal 2 (Week 1): patient will complete sit pivot transfers with CS OT Short Term Goal 3 (Week 1): patient will complete LB dressing with min A using ADs  Skilled Therapeutic Interventions/Progress Updates:    1;1. Pt received in w/c ready to go with premedicated back pain. Pt completes w/c mobility to/from all tx destinations with MOD I. Pt completes ambulatory TTB transfer with CGA for RW ambulation and uses grab bars for leverage to A with bringing BLE over edge of tub. Pt with TTB at home but no grab bars therefore would benefit from further practice. OT provides pt with reacher and walker bag and applies to walker. Edu on energy conservation while completing 2x15 BUE therex with 8# dowel rod for BUE strengthening required for ADLs and transfer. Exited session with pt seated in w/c, call light in reach and all needs met.  Therapy Documentation Precautions:  Precautions Precautions: Fall, Back Precaution Comments: revirewed BLT percautions  Restrictions Weight Bearing Restrictions: No General:   Vital Signs:  Pain: Pain Assessment Pain Scale: 0-10 Pain Score: 7  Pain Type: Surgical pain Pain Location: Back Pain Orientation: Lower Pain Radiating Towards: legs Pain Descriptors / Indicators: Aching;Constant;Discomfort Pain Frequency: Constant Pain Onset: On-going Patients Stated Pain Goal: 3 Pain Intervention(s): Medication (See eMAR) ADL: ADL Eating: Independent Where Assessed-Eating: Bed level Grooming: Setup Where Assessed-Grooming: Bed level Upper Body Dressing: Setup Where Assessed-Upper Body Dressing: Edge of bed Lower Body Dressing:  Maximal assistance Where Assessed-Lower Body Dressing: Bed level ADL Comments: dizziness and nausea limited ability to complete activities during am session Vision   Perception    Praxis   Exercises:   Other Treatments:     Therapy/Group: Individual Therapy  Tonny Branch 05/28/2019, 12:50 PM

## 2019-05-28 NOTE — Progress Notes (Signed)
Physical Therapy Session Note  Patient Details  Name: Nicolas [name] MRN: [id number] Date of Birth: 02-13-1966  Today's Date: 05/28/2019 PT Individual Time: 1430-1530 PT Individual Time Calculation (min): 60 min   Short Term Goals: Week 1:  PT Short Term Goal 1 (Week 1): Pt will complete least restrictive transfer with min A PT Short Term Goal 2 (Week 1): Pt will initiate standing PT Short Term Goal 2 - Progress (Week 1): Met PT Short Term Goal 3 (Week 1): Pt will complete bed mobility with min A  Skilled Therapeutic Interventions/Progress Updates:    Pt received seated in w/c in room, agreeable to PT session. No complaints of pain. W/c mobility independently. Sit to stand with min A to RW. Ambulation 2 x 17' with RW and mod A with close w/c follow for safety. Pt exhibits some R ankle instability with gait with inversion noted in stance phase. Will assess bracing options next session. Ascend/descend one 3" step with B handrails and mod A for balance for BLE strengthening, circumduction of RLE noted. Standing BLE strengthening exercises: marches, hip abd x 10 reps each with fair ability to perform marches against gravity due to R hip flexor weakness. Pt reports tightness in B hip flexors. Semi-reclined hip flexor stretch on therapy wedge 3 x 60 sec each. Sit to supine Supervision. Supine BLE strengthening exercises: heel slides and hip abd x 10 reps each. With use of a sheet pt is able to perform AAROM heel slides on RLE. Pt left semi-reclined in bed with needs in reach, bed alarm in place at end of session.  Therapy Documentation Precautions:  Precautions Precautions: Fall, Back Precaution Comments: revirewed BLT percautions  Restrictions Weight Bearing Restrictions: No    Therapy/Group: Individual Therapy   Excell Seltzer, PT, DPT  05/28/2019, 5:01 PM

## 2019-05-29 ENCOUNTER — Inpatient Hospital Stay (HOSPITAL_COMMUNITY): Payer: Medicare HMO | Admitting: Occupational Therapy

## 2019-05-29 ENCOUNTER — Inpatient Hospital Stay (HOSPITAL_COMMUNITY): Payer: Medicare HMO | Admitting: Physical Therapy

## 2019-05-29 LAB — GLUCOSE, CAPILLARY
Glucose-Capillary: 146 mg/dL — ABNORMAL HIGH (ref 70–99)
Glucose-Capillary: 160 mg/dL — ABNORMAL HIGH (ref 70–99)
Glucose-Capillary: 140 mg/dL — ABNORMAL HIGH (ref 70–99)
Glucose-Capillary: 196 mg/dL — ABNORMAL HIGH (ref 70–99)

## 2019-05-29 LAB — PROTIME-INR
INR: 2.7 — ABNORMAL HIGH (ref 0.8–1.2)
Prothrombin Time: 28.6 seconds — ABNORMAL HIGH (ref 11.4–15.2)

## 2019-05-29 NOTE — Progress Notes (Signed)
Occupational Therapy Weekly Progress Note  Patient Details  Name: Nicolas [name] MRN: [id number] Date of Birth: 01-02-1966  Beginning of progress report period: May 24, 2019 End of progress report period: May 29, 2019  Today's Date: 05/29/2019 OT Individual Time: 1100-1155 and 1353-1448 OT Individual Time Calculation (min): 55 min and 55 min   Patient has met 2 of 3 short term goals. Pt requires increased assist to don shoes as part of LB dressing and requires steadying assist as well when standing to pull up pants.    Pt is making excellent progress towards OT goals. He is completing functional transfers via stand pivot transfer with RW and CGA. He uses AE for LB bathing/dressing and toileting tasks and completing all tasks at overall steadying assist level.   Patient continues to demonstrate the following deficits: muscle weakness, muscle joint tightness and muscle paralysis and decreased sitting balance, decreased standing balance, decreased postural control and decreased balance strategies and therefore will continue to benefit from skilled OT intervention to enhance overall performance with BADL, iADL and Reduce care partner burden.  Patient progressing toward long term goals..  Plan of care revisions: Goals upgraded to mod I overall. Supervision tub/shower transfer. See POC for goal details. .  OT Short Term Goals Week 1:  OT Short Term Goal 1 (Week 1): patient will complete bed mobility with CS OT Short Term Goal 1 - Progress (Week 1): Met OT Short Term Goal 2 (Week 1): patient will complete sit pivot transfers with CS OT Short Term Goal 2 - Progress (Week 1): Met OT Short Term Goal 3 (Week 1): patient will complete LB dressing with min A using ADs OT Short Term Goal 3 - Progress (Week 1): Progressing toward goal Week 2:  OT Short Term Goal 1 (Week 2): STG=LTG due to LOS  Skilled Therapeutic Interventions/Progress Updates:    Session One: Pt seen for OT session focusing on  functional standing balance/endurance and UE strengthening in prep for functional tasks. Pt sitting upirhgt in w/c upon arrival, agreeable to tx session and denying pain. He declined bathing/dressing session this AM. He self propelled w/c throughout unit mod I.  Sit>stand and stand pivot transfers completed throughout session with close supervision and assist to stabilize equipment.  Standing from EOM with RW, completed dynamic standing activity, tossing horseshoes to target with emphasis on weight shift, crossing midline and alternating UE support. Completed with close supervision and increased time/ rest breaks required due to decreased functional activity tolerance. Practiced ambulating with RW to gather horseshoes using reacher to obtain items. Pt able to self initiate need for rest break during standing activity demonstrating excellent safety awareness. Ambulation trials focusing on sharp turns In simulation of home environment. Completed with close supervision and VCs for RW management. Extended seated rest break required before returning to mat. ~40f ambulation each trial. Education/discussion regarding incorporating standing/ short distance ambulation into ADL/IADL tasks at d/c as pt primarily uses power w/c within home. Pt reports doing this PTA as well.  Completed UE strengthening as follows, x3 sets of 10 of each exercise using #4 dowel rod.   Overhead press  Chest press  Bicep curl Pt self propelled w/c back to room at end of session. Agreeable to staying up in w/c at end of session, all needs in reach.   Session Two: Pt seen for OT session focusing on functional transfers and activity tolerance. Pt sitting up in w/c upon arrival, complaints of fatigue, but willing to participate in therapy  as able. He self propelled w/c to ADL apartment mod I. Addresed tub transfer bench again as practiced in yesterday's session. Pt ambulated into bathroom with CGA using RW. Completed x2 trials to  tub/shower transfer utilizing heavy duty tub transfer bench. Pt initially required min A for management of LEs over tub wall, however, on second trial pt able to manage LEs independently. VCs provided throughout for technique and problem solving accessibility. Rest breaks required btwn trials with pt ambulating in/out of bathroom with each trial. In therapy gym, completed x 2 sets of 5 minutes each SCI FIT arm cycle on level 3 for UE strengthening and overall activity tolerance. 2 minute rest break provided btwn trials.  Pt returned to room in w/c at end of session. He ambulated from doorway back to bed with close supervision.Pt returned to supine with supervision, left with all needs in reach and NT present obtaining vitals. Pt provided with information regarding ordering heavy duty tub transfer bench as cheaper to order online vs hospital ordering it for him.    Therapy Documentation Precautions:  Precautions Precautions: Fall, Back Precaution Comments: revirewed BLT percautions  Restrictions Weight Bearing Restrictions: No   Therapy/Group: Individual Therapy  Toia Micale L 05/29/2019, 6:50 AM

## 2019-05-29 NOTE — Progress Notes (Signed)
Orthopedic Tech Progress Note Patient Details:  PARAS KREIDER 1966/08/04 [id number]  Patient ID: [name], male   DOB: 1966-06-01, 53 y.o.   MRN: [id number] I called [employer] around 10 am and put in the order. They said that unless the pt was going home they wouldn't come till Monday. Then they took my info and the pt info and said that they would call back they did not.  Karolee Stamps 05/29/2019, 6:06 PM

## 2019-05-29 NOTE — Plan of Care (Signed)
Goals upgraded to mod I overall. See POC for goal details. Robynne Roat, OTR/L

## 2019-05-29 NOTE — Progress Notes (Signed)
[place] PHYSICAL MEDICINE & REHABILITATION PROGRESS NOTE   Subjective/Complaints:  no new issues. Pain under control  ROS: Patient denies fever, rash, sore throat, blurred vision, nausea, vomiting, diarrhea, cough, shortness of breath or chest pain,  headache, or mood change.   Objective:   No results found. No results for input(s): WBC, HGB, HCT, PLT in the last 72 hours. No results for input(s): NA, K, CL, CO2, GLUCOSE, BUN, CREATININE, CALCIUM in the last 72 hours.  Intake/Output Summary (Last 24 hours) at 05/29/2019 1004 Last data filed at 05/29/2019 0924 Gross per 24 hour  Intake 820 ml  Output 650 ml  Net 170 ml     Physical Exam: Vital Signs Blood pressure 130/80, pulse 94, temperature 98.4 F (36.9 C), temperature source Oral, resp. rate 20, height 5\' 10"  (1.778 m), weight (!) 136.4 kg, SpO2 100 %.  Constitutional: No distress . Vital signs reviewed. HEENT: EOMI, oral membranes moist Neck: supple Cardiovascular: RRR without murmur. No JVD    Respiratory: CTA Bilaterally without wheezes or rales. Normal effort    GI: BS +, non-tender, non-distended  Extremities: No clubbing, cyanosis, or edema  Skin: lower back incision with minimal serosang drainage Neurologic: Cranial nerves II through XII intact, motor strength is 5/5 in bilateral deltoid, bicep, tricep, grip,3+. RLE: 2+/5 HF, KE, APF, 1/5 APF. LLE 3/5. Decreased LT, pain R>L LE below mid abdomen.   Musculoskeletal: right heel cord can be ranged to almost 90 deg Psych: pleasant   Assessment/Plan: 1. Functional deficits secondary to thoracic myelopathy which require 3+ hours per day of interdisciplinary therapy in a comprehensive inpatient rehab setting.  Physiatrist is providing close team supervision and 24 hour management of active medical problems listed below.  Physiatrist and rehab team continue to assess barriers to discharge/monitor patient progress toward functional and medical goals  Care  Tool:  Bathing    Body parts bathed by patient: Right arm, Left arm, Chest, Abdomen, Right upper leg, Left upper leg, Face, Front perineal area, Left lower leg, Right lower leg, Buttocks   Body parts bathed by helper: Buttocks     Bathing assist Assist Level: Set up assist     Upper Body Dressing/Undressing Upper body dressing   What is the patient wearing?: Pull over shirt    Upper body assist Assist Level: Set up assist    Lower Body Dressing/Undressing Lower body dressing      What is the patient wearing?: Pants     Lower body assist Assist for lower body dressing: Contact Guard/Touching assist     Toileting Toileting    Toileting assist Assist for toileting: Minimal Assistance - Patient > 75% Assistive Device Comment: (Urinal)   Transfers Chair/bed transfer  Transfers assist     Chair/bed transfer assist level: Minimal Assistance - Patient > 75%     Locomotion Ambulation   Ambulation assist   Ambulation activity did not occur: Safety/medical concerns  Assist level: Moderate Assistance - Patient 50 - 74% Assistive device: Walker-rolling Max distance: 17'   Walk 10 feet activity   Assist  Walk 10 feet activity did not occur: Safety/medical concerns  Assist level: Moderate Assistance - Patient - 50 - 74% Assistive device: Walker-rolling   Walk 50 feet activity   Assist Walk 50 feet with 2 turns activity did not occur: Safety/medical concerns         Walk 150 feet activity   Assist Walk 150 feet activity did not occur: Safety/medical concerns  Walk 10 feet on uneven surface  activity   Assist Walk 10 feet on uneven surfaces activity did not occur: Safety/medical concerns         Wheelchair     Assist Will patient use wheelchair at discharge?: Yes Type of Wheelchair: Manual    Wheelchair assist level: Independent Max wheelchair distance: 150'    Wheelchair 50 feet with 2 turns activity    Assist         Assist Level: Independent   Wheelchair 150 feet activity     Assist     Assist Level: Independent    Medical Problem List and Plan:  1.Progressive quadriparesissecondary to thoracic lumbar myelopathy status post T5-6 laminectomy, T11-12 laminectomy, L3-4 laminectomy 05/14/2019 as well as history of prior cervical discectomy and thoracic laminectomy in the past. No brace required  --Continue CIR therapies including PT, OT    - PRAFO RLE to help stretch heel cord 2. Antithrombotics: -DVT/anticoagulation:SCDs.Chronic Coumadin resumed   -antiplatelet therapy: N/A 3. Pain Management:Neurontin 300 mg 3 times daily, Flexeril and oxycodone as needed 4. Mood:Provide emotional support -antipsychotic agents: N/A 5. Neuropsych: This patientiscapable of making decisions on hisown behalf. 6. Skin/Wound Care:lower incision with decreased drainage  -continue dressing to keep dry 7. Fluids/Electrolytes/Nutrition:encourage PO     8.History of clotting disorder pulmonary emboli DVT. coumadin resumed 9.Hypertension. Toprol-XL 50 mg daily, Lasix 40 mg daily.  -DBP improved    -continue current regimen 10. Constipation/neurogenic bowel:. moving bowels  -continue with bowel program 11. Low grade temp, leukocytosis: afebrile, leukocytosis resolved  -UA negative, ucx negative.   12. Morbid Obesity      LOS: 8 days A FACE TO FACE EVALUATION WAS PERFORMED  Meredith Staggers 05/29/2019, 10:04 AM

## 2019-05-29 NOTE — Progress Notes (Signed)
Physical Therapy Weekly Progress Note  Patient Details  Name: Nicolas [name] MRN: [id number] Date of Birth: 1966-02-19  Beginning of progress report period: May 22, 2019 End of progress report period: May 29, 2019  Today's Date: 05/29/2019 PT Individual Time: 0900-1000 PT Individual Time Calculation (min): 60 min   Patient has met 3 of 3 short term goals.  Pt is at Supervision level for bed mobility with use of bedrails (pt owns hospital bed at home), is Supervision to [facility] for sit to stand and stand pivot transfer with RW, and is able to ambulate up to 20' with RW and min A. Pt continues to make great progress and exhibits good motivation and participation on therapy sessions.  Patient continues to demonstrate the following deficits muscle weakness, abnormal tone and unbalanced muscle activation and decreased standing balance, decreased postural control and decreased balance strategies and therefore will continue to benefit from skilled PT intervention to increase functional independence with mobility.  Patient progressing toward long term goals..  Plan of care revisions: Upgraded transfer goals to mod I, car transfer goal to setup A, and gait goal of 20' with RW and Supervision..  PT Short Term Goals Week 1:  PT Short Term Goal 1 (Week 1): Pt will complete least restrictive transfer with min A PT Short Term Goal 1 - Progress (Week 1): Met PT Short Term Goal 2 (Week 1): Pt will initiate standing PT Short Term Goal 2 - Progress (Week 1): Met PT Short Term Goal 3 (Week 1): Pt will complete bed mobility with min A PT Short Term Goal 3 - Progress (Week 1): Met Week 2:  PT Short Term Goal 1 (Week 2): =LTG due to ELOS  Skilled Therapeutic Interventions/Progress Updates:    Pt received seated in bed, agreeable to PT session. No complaints of pain. Bed mobility Supervision with use of bed features. Pt is setup A to don shirt while seated EOB, dependent to don TED hose and shoes for time  conservation. Sit to stand with Supervision to RW, min A for standing balance while pulling up pants. Stand pivot transfer bed to w/c with CGA and RW. Ambulation x 15', x 20' with RW and min A, ongoing R ankle instability and inversion noted with gait. Trial gait with DF and eversion assist wrap x 15 ft with min A and RW. Pt exhibits improved eversion but some ankle instability remains, will consult orthotist for best bracing for pt. Nustep level 5 x 5 min with use of B UE/LE with focus on LE>UE for strengthening. Pt requires several rest breaks on Nustep and reports feeling fatigued following exercise. Toilet transfer with CGA and use of grab bars and RW. Pt left seated in w/c in room with needs in reach at end of session.  Therapy Documentation Precautions:  Precautions Precautions: Fall, Back Precaution Comments: revirewed BLT percautions  Restrictions Weight Bearing Restrictions: No   Therapy/Group: Individual Therapy   Excell Seltzer, PT, DPT 05/29/2019, 12:22 PM

## 2019-05-29 NOTE — Plan of Care (Signed)
Upgraded transfer goals to mod I, car transfer and mobility goals to setup A and Supervision due to progress

## 2019-05-29 NOTE — Progress Notes (Signed)
ANTICOAGULATION CONSULT NOTE - Follow Up Consult  Pharmacy Consult for Warfarin Indication: h/o recurrent VTE   Allergies  Allergen Reactions  . Ace Inhibitors Other (See Comments)    cough  . Lisinopril Other (See Comments)    cough    Patient Measurements: Height: 5\' 10"  (177.8 cm) Weight: (!) 300 lb 11.3 oz (136.4 kg) IBW/kg (Calculated) : 73   Vital Signs: Temp: 98.4 F (36.9 C) (07/03 0509) Temp Source: Oral (07/03 0509) BP: 130/80 (07/03 0509) Pulse Rate: 94 (07/03 0509)  Labs: Recent Labs    05/27/19 0616 05/29/19 0502  LABPROT 27.1* 28.6*  INR 2.6* 2.7*    Estimated Creatinine Clearance: 133.6 mL/min (by C-G formula based on SCr of 0.9 mg/dL).   Assessment: 79 YOM with progressive lower extremity weakness s/p laminectomies on 05/14/19. Pharmacy consulted to resume warfarin on 6/23 from PTA for history of recurrent VTE.   Home Warfarin dose: 5mg  daily except 10mg  on Tues/Thurs/Sun  INR = 2.7 today, remains therapeutic. CBC stable (on 6/29).   Goal of Therapy:  INR 2-3 Monitor platelets by anticoagulation protocol: Yes   Plan:   Continue home warfarin regimen of 5mg  daily except 10mg  on Tues/Thurs/Sun  PT/INR on Mon/Wed/Fri  Monitor closely for s/sx of bleeding   Lindell Spar, PharmD, BCPS Clinical Pharmacist  05/29/2019 11:35 AM

## 2019-05-30 ENCOUNTER — Inpatient Hospital Stay (HOSPITAL_COMMUNITY): Payer: Medicare HMO

## 2019-05-30 LAB — GLUCOSE, CAPILLARY
Glucose-Capillary: 149 mg/dL — ABNORMAL HIGH (ref 70–99)
Glucose-Capillary: 163 mg/dL — ABNORMAL HIGH (ref 70–99)
Glucose-Capillary: 151 mg/dL — ABNORMAL HIGH (ref 70–99)
Glucose-Capillary: 156 mg/dL — ABNORMAL HIGH (ref 70–99)

## 2019-05-30 MED ORDER — POLYETHYLENE GLYCOL 3350 17 G PO PACK
17.0000 g | PACK | Freq: Two times a day (BID) | ORAL | Status: DC
  Administered 2019-05-30 – 2019-06-03 (×9): 17 g via ORAL
  Filled 2019-05-30 (×9): qty 1

## 2019-05-30 NOTE — Progress Notes (Signed)
[place] PHYSICAL MEDICINE & REHABILITATION PROGRESS NOTE   Subjective/Complaints: Patient seen sitting up in bed this morning.  He states he slept well overnight.  He notes constipation.  ROS: + Constipation.  Denies CP, shortness of breath, nausea, vomiting, diarrhea.  Objective:   No results found. No results for input(s): WBC, HGB, HCT, PLT in the last 72 hours. No results for input(s): NA, K, CL, CO2, GLUCOSE, BUN, CREATININE, CALCIUM in the last 72 hours.  Intake/Output Summary (Last 24 hours) at 05/30/2019 1055 Last data filed at 05/30/2019 1052 Gross per 24 hour  Intake 1016 ml  Output 1300 ml  Net -284 ml     Physical Exam: Vital Signs Blood pressure 136/71, pulse 85, temperature 98.1 F (36.7 C), temperature source Oral, resp. rate 16, height 5\' 10"  (1.778 m), weight (!) 136.4 kg, SpO2 95 %. Constitutional: No distress . Vital signs reviewed. HENT: Normocephalic.  Atraumatic. Eyes: EOMI. No discharge. Cardiovascular: No JVD. Respiratory: Normal effort. GI: Non-distended. Musc: No edema or tenderness in extremities. Skin: lower back incision with minimal serosang drainage Neurologic: Alert and oriented Motor: Bilateral upper extremities: 5/5 proximal distal Right lower extremity: Hip flexion, knee extension 3/5, ankle dorsiflexion 1+/5 LLE 4-/5 proximal to distal  Psych: Normal mood.  Normal behavior.  Assessment/Plan: 1. Functional deficits secondary to thoracic myelopathy which require 3+ hours per day of interdisciplinary therapy in a comprehensive inpatient rehab setting.  Physiatrist is providing close team supervision and 24 hour management of active medical problems listed below.  Physiatrist and rehab team continue to assess barriers to discharge/monitor patient progress toward functional and medical goals  Care Tool:  Bathing    Body parts bathed by patient: Right arm, Left arm, Chest, Abdomen, Right upper leg, Left upper leg, Face, Front perineal  area, Left lower leg, Right lower leg, Buttocks   Body parts bathed by helper: Buttocks     Bathing assist Assist Level: Set up assist     Upper Body Dressing/Undressing Upper body dressing   What is the patient wearing?: Pull over shirt    Upper body assist Assist Level: Set up assist    Lower Body Dressing/Undressing Lower body dressing      What is the patient wearing?: Pants     Lower body assist Assist for lower body dressing: Contact Guard/Touching assist     Toileting Toileting    Toileting assist Assist for toileting: Minimal Assistance - Patient > 75% Assistive Device Comment: (Urinal)   Transfers Chair/bed transfer  Transfers assist     Chair/bed transfer assist level: Contact Guard/Touching assist     Locomotion Ambulation   Ambulation assist   Ambulation activity did not occur: Safety/medical concerns  Assist level: Minimal Assistance - Patient > 75% Assistive device: Walker-rolling Max distance: 20'   Walk 10 feet activity   Assist  Walk 10 feet activity did not occur: Safety/medical concerns  Assist level: Minimal Assistance - Patient > 75% Assistive device: Walker-rolling   Walk 50 feet activity   Assist Walk 50 feet with 2 turns activity did not occur: Safety/medical concerns         Walk 150 feet activity   Assist Walk 150 feet activity did not occur: Safety/medical concerns         Walk 10 feet on uneven surface  activity   Assist Walk 10 feet on uneven surfaces activity did not occur: Safety/medical concerns         Wheelchair     Assist Will patient  use wheelchair at discharge?: Yes Type of Wheelchair: Manual    Wheelchair assist level: Independent Max wheelchair distance: 150'    Wheelchair 50 feet with 2 turns activity    Assist        Assist Level: Independent   Wheelchair 150 feet activity     Assist     Assist Level: Independent    Medical Problem List and Plan:   1.Progressive quadriparesissecondary to thoracic lumbar myelopathy status post T5-6 laminectomy, T11-12 laminectomy, L3-4 laminectomy 05/14/2019 as well as history of prior cervical discectomy and thoracic laminectomy in the past. No brace required  Continue CIR  Notes reviewed- thoracic myelopathy status post decompression, labs personally reviewed   PRAFO RLE to help stretch heel cord 2. Antithrombotics: -DVT/anticoagulation:SCDs.Chronic Coumadin resumed    INR therapeutic on 7/3 -antiplatelet therapy: N/A 3. Pain Management:Neurontin 300 mg 3 times daily, Flexeril and oxycodone as needed 4. Mood:Provide emotional support -antipsychotic agents: N/A 5. Neuropsych: This patientis capable of making decisions on hisown behalf. 6. Skin/Wound Care:  -continue dressing  7. Fluids/Electrolytes/Nutrition:encourage PO 8.History of clotting disorder pulmonary emboli DVT. coumadin resumed 9.Hypertension. Toprol-XL 50 mg daily, Lasix 40 mg daily.  -continue current regimen  Controlled on 7/4 10. Constipation/neurogenic bowel:. moving bowels  -continue with bowel program 11. Morbid Obesity: Encouraged weight loss 12.  Diabetes mellitus type 2  Labile on 7/4 13.  Hyponatremia  Sodium 134 on 6/26, labs ordered for Monday  Continue to monitor    14.  Drug-induced constipation  Bowel regimen increased on 7/4  LOS: 9 days A FACE TO FACE EVALUATION WAS PERFORMED   Lorie Phenix 05/30/2019, 10:55 AM

## 2019-05-31 ENCOUNTER — Inpatient Hospital Stay (HOSPITAL_COMMUNITY): Payer: Medicare HMO

## 2019-05-31 DIAGNOSIS — E871 Hypo-osmolality and hyponatremia: Secondary | ICD-10-CM

## 2019-05-31 DIAGNOSIS — R7309 Other abnormal glucose: Secondary | ICD-10-CM

## 2019-05-31 DIAGNOSIS — K5903 Drug induced constipation: Secondary | ICD-10-CM

## 2019-05-31 DIAGNOSIS — Z7901 Long term (current) use of anticoagulants: Secondary | ICD-10-CM

## 2019-05-31 DIAGNOSIS — E1169 Type 2 diabetes mellitus with other specified complication: Secondary | ICD-10-CM

## 2019-05-31 DIAGNOSIS — E669 Obesity, unspecified: Secondary | ICD-10-CM

## 2019-05-31 LAB — GLUCOSE, CAPILLARY
Glucose-Capillary: 136 mg/dL — ABNORMAL HIGH (ref 70–99)
Glucose-Capillary: 161 mg/dL — ABNORMAL HIGH (ref 70–99)
Glucose-Capillary: 140 mg/dL — ABNORMAL HIGH (ref 70–99)
Glucose-Capillary: 179 mg/dL — ABNORMAL HIGH (ref 70–99)

## 2019-05-31 MED ORDER — MAGNESIUM HYDROXIDE 400 MG/5ML PO SUSP
30.0000 mL | Freq: Once | ORAL | Status: AC
  Administered 2019-05-31: 30 mL via ORAL
  Filled 2019-05-31: qty 30

## 2019-05-31 NOTE — Progress Notes (Signed)
Physical Therapy Session Note  Patient Details  Name: Nicolas [name] MRN: [id number] Date of Birth: [birth year]  Today's Date: 05/31/2019 PT Individual Time: 1025-1135 PT Individual Time Calculation (min): 70 min   Short Term Goals: Week 2:  PT Short Term Goal 1 (Week 2): =LTG due to ELOS  Skilled Therapeutic Interventions/Progress Updates:    Pt requesting to work on shower during this session. Functional bed mobility with rails at modified independent level. Performed various transfers during session including bed -> w/c, w/c <> toilet, and toilet> tub bench > w/c with RW with overall CGA for balance and anterior weightshift during sit <> stands. Pt performed hygiene for toileting at modified independent level. Short distance gait to transfer to tub bench with RW and set up for shower. Performed bathing at modified independent level using AE for independence.Transferred back to w/c with CGA with RW. Performed dressing at w/c level mod I for shirt and min assist for pants due to assist for balance when pulling up. Pt able to thread pants using reacher for independence. PT assisted with Tedhose and shoes. Oral hygiene at sink independent. Mod I w/c mobility on unit with good technique and efficient propulsion. Seated Kinetron on resistance 50 cm/sec x 3 reps of 1 min intervals with rest breaks between sets for BLE strengthening. Pt making excellent progress.   Therapy Documentation Precautions:  Precautions Precautions: Fall, Back Precaution Comments: revirewed BLT percautions  Restrictions Weight Bearing Restrictions: No Pain: No complaints.    Therapy/Group: Individual Therapy  Canary Brim Ivory Broad, PT, DPT, CBIS  05/31/2019, 11:38 AM

## 2019-05-31 NOTE — Progress Notes (Signed)
[facility] PHYSICAL MEDICINE & REHABILITATION PROGRESS NOTE   Subjective/Complaints: Patient seen sitting up in bed this morning.  He states he slept well overnight.  He states he still constipated.  He states he forgot to wear his PRAFO overnight.  ROS: + Constipation.  Denies CP, shortness of breath, nausea, vomiting, diarrhea.  Objective:   No results found. No results for input(s): WBC, HGB, HCT, PLT in the last 72 hours. No results for input(s): NA, K, CL, CO2, GLUCOSE, BUN, CREATININE, CALCIUM in the last 72 hours.  Intake/Output Summary (Last 24 hours) at 05/31/2019 1023 Last data filed at 05/31/2019 0730 Gross per 24 hour  Intake 684 ml  Output 550 ml  Net 134 ml     Physical Exam: Vital Signs Blood pressure 134/78, pulse 86, temperature 98.1 F (36.7 C), resp. rate 16, height 5\' 10"  (1.778 m), weight (!) 136.4 kg, SpO2 98 %. Constitutional: No distress . Vital signs reviewed. HENT: Normocephalic.  Atraumatic. Eyes: EOMI.  No discharge. Cardiovascular: No JVD. Respiratory: Normal effort. GI: Non-distended. Musc: No edema or tenderness in extremities. Neurologic: Alert and oriented Motor: Bilateral upper extremities: 5/5 proximal distal Right lower extremity: Hip flexion, knee extension 3+/5, ankle dorsiflexion 2/5 LLE 4-/5 proximal to distal, unchanged Psych: Normal mood.  Normal behavior.  Assessment/Plan: 1. Functional deficits secondary to thoracic myelopathy which require 3+ hours per day of interdisciplinary therapy in a comprehensive inpatient rehab setting.  Physiatrist is providing close team supervision and 24 hour management of active medical problems listed below.  Physiatrist and rehab team continue to assess barriers to discharge/monitor patient progress toward functional and medical goals  Care Tool:  Bathing    Body parts bathed by patient: Right arm, Left arm, Chest, Abdomen, Right upper leg, Left upper leg, Face, Front perineal area, Left lower  leg, Right lower leg, Buttocks   Body parts bathed by helper: Buttocks     Bathing assist Assist Level: Set up assist     Upper Body Dressing/Undressing Upper body dressing   What is the patient wearing?: Pull over shirt    Upper body assist Assist Level: Set up assist    Lower Body Dressing/Undressing Lower body dressing      What is the patient wearing?: Pants     Lower body assist Assist for lower body dressing: Contact Guard/Touching assist     Toileting Toileting    Toileting assist Assist for toileting: Minimal Assistance - Patient > 75% Assistive Device Comment: (Urinal)   Transfers Chair/bed transfer  Transfers assist     Chair/bed transfer assist level: Contact Guard/Touching assist     Locomotion Ambulation   Ambulation assist   Ambulation activity did not occur: Safety/medical concerns  Assist level: Minimal Assistance - Patient > 75% Assistive device: Walker-rolling Max distance: 20'   Walk 10 feet activity   Assist  Walk 10 feet activity did not occur: Safety/medical concerns  Assist level: Minimal Assistance - Patient > 75% Assistive device: Walker-rolling   Walk 50 feet activity   Assist Walk 50 feet with 2 turns activity did not occur: Safety/medical concerns         Walk 150 feet activity   Assist Walk 150 feet activity did not occur: Safety/medical concerns         Walk 10 feet on uneven surface  activity   Assist Walk 10 feet on uneven surfaces activity did not occur: Safety/medical concerns         Wheelchair     Assist  Will patient use wheelchair at discharge?: Yes Type of Wheelchair: Manual    Wheelchair assist level: Independent Max wheelchair distance: 150'    Wheelchair 50 feet with 2 turns activity    Assist        Assist Level: Independent   Wheelchair 150 feet activity     Assist     Assist Level: Independent    Medical Problem List and Plan:  1.Progressive  quadriparesissecondary to thoracic lumbar myelopathy status post T5-6 laminectomy, T11-12 laminectomy, L3-4 laminectomy 05/14/2019 as well as history of prior cervical discectomy and thoracic laminectomy in the past. No brace required  Continue CIR   PRAFO RLE to help stretch heel cord 2. Antithrombotics: -DVT/anticoagulation:SCDs.Chronic Coumadin resumed    INR therapeutic on 7/3, no repeat values since -antiplatelet therapy: N/A 3. Pain Management:Neurontin 300 mg 3 times daily, Flexeril and oxycodone as needed 4. Mood:Provide emotional support -antipsychotic agents: N/A 5. Neuropsych: This patientis capable of making decisions on hisown behalf. 6. Skin/Wound Care:  -continue dressing  7. Fluids/Electrolytes/Nutrition:encourage PO 8.History of clotting disorder pulmonary emboli DVT. coumadin resumed 9.Hypertension. Toprol-XL 50 mg daily, Lasix 40 mg daily.  -continue current regimen  Controlled on 7/5 10. Morbid Obesity: Encouraged weight loss 11.  Diabetes mellitus type 2  Labile on 7/5 12.  Hyponatremia  Sodium 134 on 6/26, labs ordered for tomorrow  Continue to monitor    13.  Drug-induced constipation  Bowel regimen increased on 7/4, increased again on 7/5  LOS: 10 days A FACE TO FACE EVALUATION WAS PERFORMED  Laqueena Hinchey Lorie Phenix 05/31/2019, 10:23 AM

## 2019-06-01 ENCOUNTER — Inpatient Hospital Stay (HOSPITAL_COMMUNITY): Payer: Medicare HMO | Admitting: Physical Therapy

## 2019-06-01 ENCOUNTER — Inpatient Hospital Stay (HOSPITAL_COMMUNITY): Payer: Medicare HMO | Admitting: Occupational Therapy

## 2019-06-01 LAB — GLUCOSE, CAPILLARY
Glucose-Capillary: 145 mg/dL — ABNORMAL HIGH (ref 70–99)
Glucose-Capillary: 127 mg/dL — ABNORMAL HIGH (ref 70–99)
Glucose-Capillary: 127 mg/dL — ABNORMAL HIGH (ref 70–99)
Glucose-Capillary: 169 mg/dL — ABNORMAL HIGH (ref 70–99)

## 2019-06-01 LAB — PROTIME-INR
INR: 3.3 — ABNORMAL HIGH (ref 0.8–1.2)
Prothrombin Time: 32.9 seconds — ABNORMAL HIGH (ref 11.4–15.2)

## 2019-06-01 LAB — CBC
HCT: 41.5 % (ref 39.0–52.0)
Hemoglobin: 13.2 g/dL (ref 13.0–17.0)
MCH: 29.7 pg (ref 26.0–34.0)
MCHC: 31.8 g/dL (ref 30.0–36.0)
MCV: 93.3 fL (ref 80.0–100.0)
Platelets: 326 10*3/uL (ref 150–400)
RBC: 4.45 MIL/uL (ref 4.22–5.81)
RDW: 14 % (ref 11.5–15.5)
WBC: 7.4 10*3/uL (ref 4.0–10.5)
nRBC: 0 % (ref 0.0–0.2)

## 2019-06-01 MED ORDER — WARFARIN SODIUM 5 MG PO TABS: 10.0000 mg | ORAL_TABLET | ORAL | Status: DC

## 2019-06-01 MED ORDER — WARFARIN SODIUM 5 MG PO TABS
5.0000 mg | ORAL_TABLET | ORAL | Status: DC
  Administered 2019-06-01 – 2019-06-02 (×2): 5 mg via ORAL
  Filled 2019-06-01 (×2): qty 1

## 2019-06-01 NOTE — Progress Notes (Signed)
Physical Therapy Session Note  Patient Details  Name: Nicolas [name] MRN: [id number] Date of Birth: 10-20-1966  Today's Date: 06/01/2019 PT Individual Time: 0825-0921 PT Individual Time Calculation (min): 56 min   Short Term Goals: Week 2:  PT Short Term Goal 1 (Week 2): =LTG due to ELOS  Skilled Therapeutic Interventions/Progress Updates:    pt rec'd in bed agreeable to therapy. Pt performs bed mobility with supervision.  Stand pivot transfers with min A throughout session.  Pt performs standing balance with 1 UE support for horseshoe toss with focus on LE strength and endurance 4 x 2 minutes with min A.  Side stepping with RW 10' x 4 with min A for wt shifting and balance.  Sit <> stand from decreasing height surfaces with 1 UE support to focus on LE strengthening with improving endurance and control noted.  kinetron 3 x 30 reps for LE strength and endurance.  Pt left in w/c with all needs at hand.  Therapy Documentation Precautions:  Precautions Precautions: Fall, Back Precaution Comments: revirewed BLT percautions  Restrictions Weight Bearing Restrictions: No Pain: No c/o pain   Therapy/Group: Individual Therapy  Tadao Emig 06/01/2019, 9:22 AM

## 2019-06-01 NOTE — Progress Notes (Signed)
Physical Therapy Session Note  Patient Details  Name: Nicolas [name] MRN: [id number] Date of Birth: 08/28/1966  Today's Date: 06/01/2019 PT Individual Time: 1230-1330 PT Individual Time Calculation (min): 60 min   Short Term Goals: Week 2:  PT Short Term Goal 1 (Week 2): =LTG due to ELOS  Skilled Therapeutic Interventions/Progress Updates:    Pt received seated in w/c in room, agreeable to PT session. No complaints of pain. Manual w/c propulsion x 150 ft with use of BUE at mod I level. Standing balance while transferring laundry from washer to dryer with use of reacher, CGA for balance. Provided handout of seated and supine BLE strengthening therex. Reviewed exercises with modifications as needed. Pt demos good understanding and performance of therex. Stand pivot transfer w/c to mat table with CGA and RW. Standing alt L/R target taps with CGA and RW for balance to focus on BLE coordination training. Pt reports legs are fatigued following standing activity and declines further standing activity this session. Seated BLE strengthening therex with 5# weighted dowel: biceps curls, tricep ext, chest press. Pt left seated in w/c in room with needs in reach at end of session.  Therapy Documentation Precautions:  Precautions Precautions: Fall, Back Precaution Comments: revirewed BLT percautions  Restrictions Weight Bearing Restrictions: No    Therapy/Group: Individual Therapy   Excell Seltzer, PT, DPT  06/01/2019, 1:47 PM

## 2019-06-01 NOTE — Discharge Summary (Signed)
Physician Discharge Summary  Patient ID: FALON FLINCHUM MRN: [id number] DOB/AGE: [birth year] 53 y.o.  Admit date: 05/21/2019 Discharge date: 06/03/2019  Discharge Diagnoses:  Active Problems:   Thoracic myelopathy   Drug induced constipation   Hyponatremia   Labile blood glucose   Diabetes mellitus type 2 in obese (HCC)   Morbid obesity (HCC) DVT prophylaxis History of clotting disorder pulmonary emboli with DVT  Discharged Condition: Stable  Significant Diagnostic Studies: Dg Thoracolumabar Spine  Result Date: 05/14/2019 CLINICAL DATA:  Intraoperative localization for decompressive laminectomy at T5-6, T11-12, and L3-4. EXAM: THORACOLUMBAR SPINE 1V COMPARISON:  Prior MRI from 01/14/2019 FINDINGS: Three spot PA views of the thoracolumbar spine demonstrate surgical markers at the T5-6, T11-12, and L3-4 levels. Examination used for surgical localization purposes. IMPRESSION: Intraoperative localization, as above.  She Electronically Signed   By: Jeannine Boga M.D.   On: 05/14/2019 20:03   Ct Thoracic Spine Wo Contrast  Result Date: 05/14/2019 CLINICAL DATA:  Thoracic laminectomy today. Severe postoperative pain. EXAM: CT THORACIC SPINE WITHOUT CONTRAST TECHNIQUE: Multidetector CT images of the thoracic were obtained using the standard protocol without intravenous contrast. COMPARISON:  MRI thoracic spine 01/14/2019 FINDINGS: Alignment: Normal Vertebrae: No vertebral fracture or mass. Paraspinal and other soft tissues: Postop laminectomy at T4 and T5 with gas in the soft tissues as expected. Postop laminectomy T10-11 and T11-T12 with gas in the soft tissues as expected. No postoperative hematoma or fluid collection. Visualized lungs clear without infiltrate or effusion. Disc levels: C7-T1: Bilateral facet degeneration with mild foraminal narrowing bilaterally and mild spinal stenosis T1-2: Moderate foraminal stenosis bilaterally due to facet hypertrophy and vertebral spurring. Mild spinal  stenosis T2-3: Severe facet degeneration and severe foraminal encroachment bilaterally. Spinal canal not significantly narrowed T3-4: Severe facet hypertrophy bilaterally. Severe foraminal encroachment on the right and mild foraminal encroachment on the left. T4-5: Posterior decompression. Severe foraminal encroachment on the right due to facet hypertrophy. Mild left foraminal stenosis. T5-6: Posterior decompression without significant spinal stenosis. Severe right foraminal stenosis and moderate left foraminal encroachment due to facet hypertrophy. T6-7: Severe facet degeneration. Complete obliteration of the right foramen and severe left foraminal stenosis. Moderate to severe spinal stenosis. T7-8: Severe facet hypertrophy. Severe right foraminal encroachment and moderate to severe left foraminal encroachment. Moderate spinal stenosis. T8-9: Chronic laminectomy with bony overgrowth of the facets. Severe foraminal encroachment bilaterally. Mild spinal stenosis. T9-10: Chronic laminectomy with bony overgrowth the posterior elements. Severe facet hypertrophy bilaterally with severe foraminal stenosis bilaterally T10-11: Recent laminectomy with gas in the soft tissues. Severe facet hypertrophy causing severe foraminal encroachment bilaterally. T11-12: Recent laminectomy with adequate decompression of the canal. Severe facet hypertrophy causing severe foraminal encroachment bilaterally. T12-L1: Severe facet degeneration bilaterally. Moderate foraminal stenosis bilaterally. IMPRESSION: 1. Postop laminectomy T4 and T5 with gas in the soft tissues as expected. No hematoma 2. Postop laminectomy T10-11 and T12-L1 with adequate decompression of the canal. No postoperative hematoma. 3. Severe diffuse facet degeneration throughout the thoracic spine with marked bony overgrowth of the facets well as diffuse disc disease causing severe foraminal encroachment at multiple levels throughout the thoracic spine Electronically Signed    By: Franchot Gallo M.D.   On: 05/14/2019 20:45   Ct Lumbar Spine Wo Contrast  Result Date: 05/14/2019 CLINICAL DATA:  Thoracic and lumbar laminectomy today. Severe postoperative pain EXAM: CT LUMBAR SPINE WITHOUT CONTRAST TECHNIQUE: Multidetector CT imaging of the lumbar spine was performed without intravenous contrast administration. Multiplanar CT image reconstructions were also generated. COMPARISON:  Lumbar radiographs 12/16/2018, MRI 12/01/2018 FINDINGS: Segmentation: Normal Alignment: Normal Vertebrae: Negative for fracture or mass. Paraspinal and other soft tissues: IVC filter in good position. Abdominal aorta normal. No paraspinous mass or fluid collection. Gas in soft tissues from laminectomy L2-3 and L3-4 today. Disc levels: L1-2: Marked facet hypertrophy bilaterally causing mild spinal stenosis. Moderate to severe foraminal stenosis bilaterally due to bony overgrowth L2-3: Postop laminectomy with adequate decompression of the spinal canal. Moderate foraminal stenosis bilaterally due to vertebral spurring and bony overgrowth of the facet joints bilaterally. L3-4: Decompressive laminectomy. Spinal canal adequately decompressed above the disc space however at and below the disc space there is marked facet hypertrophy causing moderate spinal stenosis. Severe subarticular and foraminal stenosis bilaterally due to facet hypertrophy. L4-5: Remote laminectomy. Spinal canal adequate the decompressed. Severe subarticular stenosis bilaterally due to facet hypertrophy and disc bulging L5-S1: Severe facet hypertrophy bilaterally. Severe subarticular stenosis bilaterally. Severe foraminal stenosis bilaterally. IMPRESSION: 1. Lumbar laminectomy at L2-3 3 and L3-4 today. Gas in the soft tissues as expected. No fluid collection or hematoma. 2. Severe degenerative changes throughout the lumbar spine with prominent bony overgrowth of the facet joints diffusely causing extensive subarticular and foraminal stenosis  throughout the lumbar spine. 3. Remote laminectomy L4-5. Electronically Signed   By: Franchot Gallo M.D.   On: 05/14/2019 20:34   Dg C-arm 1-60 Min  Result Date: 05/14/2019 CLINICAL DATA:  Intraoperative localization for decompressive laminectomy at T5-6, T11-12, and L3-4. EXAM: THORACOLUMBAR SPINE 1V COMPARISON:  Prior MRI from 01/14/2019 FINDINGS: Three spot PA views of the thoracolumbar spine demonstrate surgical markers at the T5-6, T11-12, and L3-4 levels. Examination used for surgical localization purposes. IMPRESSION: Intraoperative localization, as above.  She Electronically Signed   By: Jeannine Boga M.D.   On: 05/14/2019 20:03    Labs:  Basic Metabolic Panel: No results for input(s): NA, K, CL, CO2, GLUCOSE, BUN, CREATININE, CALCIUM, MG, PHOS in the last 168 hours.  CBC: Recent Labs  Lab 06/01/19 0603  WBC 7.4  HGB 13.2  HCT 41.5  MCV 93.3  PLT 326    CBG: Recent Labs  Lab 06/01/19 2106 06/02/19 0637 06/02/19 1207 06/02/19 1721 06/02/19 2121  GLUCAP 169* 151* 147* 119* 201*   Family history.  Mother with lung cancer.  Father with diabetes and prostate cancer as well as hypertension.  Brother with hypertension and diabetes.  Negative colon cancer or pancreatic cancer  Brief HPI:   Nicolas [name] is a 53 year old right-handed male history of hypertension, clotting disorder with pulmonary emboli DVT maintained on chronic Coumadin followed by [facility] as well as cervical myelopathy with cervical disc surgery x2 and previous thoracic laminectomy.  Lives with 41 year old son used a walker prior to admission.  He has a daughter in the area.  Presented 05/14/2019 with progressive lower extremity weakness right greater than left.  X-rays and imaging demonstrated thoracic and lumbar spinal stenosis with myelopathy.  Underwent T5-6 laminectomy, T11-12 laminectomy through a separate incision, L3-4 laminotomy through a separate incision 05/14/2019 per Dr. Arnoldo Morale.   Hospital course pain management.  No back brace required.  Chronic Coumadin resumed.  Patient was admitted for a comprehensive rehab program  Hospital Course: RAUNAK ANTUNA was admitted to rehab 05/21/2019 for inpatient therapies to consist of PT, ST and OT at least three hours five days a week. Past admission physiatrist, therapy team and rehab RN have worked together to provide customized collaborative inpatient rehab.  Pertaining to patient thoracic  lumbar myelopathy had undergone multilevel thoracic as well as lumbar laminectomy 05/14/2019 per Dr. Arnoldo Morale.  No back brace required.  PRAFO right lower extremity to help with stretching of heel cord.  Patient remained on chronic Coumadin no bleeding episodes he would follow-up with [facility] for ongoing Coumadin needs.  Pain management use of scheduled Neurontin as well as Flexeril and oxycodone as needed.  Blood pressures controlled Toprol and Lasix he would follow-up outpatient.  Physical exam.  Blood pressure 137/58 pulse 116 temperature 98.2 respirations 18 oxygen saturation 94% room air Neurological.  Alert pleasant cooperative good medical historian General.  No acute distress mood and affect appropriate Heart.  Regular rate rhythm no rubs murmurs or extra sounds Lungs.  Clear to auscultation breathing unlabored no rales or wheezes Abdomen.  Positive bowel sounds soft nontender to palpation nondistended Extremities no clubbing cyanosis or edema Neurological.  Cranial nerves II through XII intact motor strength 5 out of 5 bilateral deltoid, bicep, tricep, grip, 3- hip flexors, knee extensors, ankle dorsi and plantar flexion.  Rehab course: During patient's stay in rehab weekly team conferences were held to monitor patient's progress, set goals and discuss barriers to discharge. At admission, patient required max assist sit to stand, max assist lateral scoot transfers, moderate assist supine to sit, moderate assist side-lying to sitting.   Minimal assist upper body bathing max assist lower body bathing minimal assist upper body dressing moderate assist toilet transfers  He  has had improvement in activity tolerance, balance, postural control as well as ability to compensate for deficits. He has had improvement in functional use RUE/LUE  and RLE/LLE as well as improvement in awareness.  Manual wheelchair propulsion 150 feet modified independent level.  Standing balance while transferring laundry from washer dryer with use of a reacher contact-guard assist for balance.  Demonstrates good understanding and performance of therex.  Stand pivot transfers wheelchair to mat table contact-guard.  Short distance ambulation with rolling walker.  Completes obstacle course of ambulating weaving through cones and simulation of more functional realistic environment.  Gather his belongings for ADLs.  Family teaching completed plan discharge to home       Disposition: Discharge disposition: 01-Home or Self Care     Discharge to home   Diet: Regular  Special Instructions: No driving smoking or alcohol  Home health nurse to check INR on Friday, 06/05/2019 results to [facility] heart care Coumadin clinic phone [phone] fax [phone]  Medications at discharge. 1.  Tylenol as needed 2.  Dulcolax suppository daily as needed moderate constipation 3.  Flexeril 10 mg 3 times daily as needed 4.  Lasix 40 mg p.o. daily 5.  Toprol-XL 50 mg every morning 6.  Oxycodone 10 mg every 4 hours as needed pain 7.  MiraLAX twice daily hold for loose stools 8.  Potassium chloride 20 mEq every other day 9.  Senokot S2 tablets twice daily 10.  Coumadin 5 mg daily except 10 mg on Thursdays and Sundays  Discharge Instructions    Ambulatory referral to Physical Medicine Rehab   Complete by: As directed    Moderate complexity follow-up 1 to 2 weeks thoracic myelopathy      Follow-up Information    Meredith Staggers, MD Follow up.   Specialty: Physical  Medicine and Rehabilitation Why: Office to call for appointment Contact information: [address] [place] [address] [phone]        [name], MD Follow up.   Specialty:  Neurosurgery Why: Call for appointment Contact information: [address] [place] [address] [phone]           Signed: Cathlyn Parsons 06/03/2019, 5:36 AM

## 2019-06-01 NOTE — Progress Notes (Signed)
[facility] PHYSICAL MEDICINE & REHABILITATION PROGRESS NOTE   Subjective/Complaints: Patient states he had several large bowel movements yesterday after laxatives.  Discussed with nursing. Labs reviewed.  ROS: - Constipation.  Denies CP, shortness of breath, nausea, vomiting, diarrhea.  Objective:   No results found. Recent Labs    06/01/19 0603  WBC 7.4  HGB 13.2  HCT 41.5  PLT 326   No results for input(s): NA, K, CL, CO2, GLUCOSE, BUN, CREATININE, CALCIUM in the last 72 hours.  Intake/Output Summary (Last 24 hours) at 06/01/2019 0810 Last data filed at 06/01/2019 0750 Gross per 24 hour  Intake 881 ml  Output 575 ml  Net 306 ml     Physical Exam: Vital Signs Blood pressure 119/70, pulse 78, temperature 98.2 F (36.8 C), resp. rate 15, height 5\' 10"  (1.778 m), weight (!) 136.4 kg, SpO2 100 %. Constitutional: No distress . Vital signs reviewed. HENT: Normocephalic.  Atraumatic. Eyes: EOMI.  No discharge. Cardiovascular: No JVD. Respiratory: Normal effort. GI: Non-distended. Musc: No edema or tenderness in extremities. Neurologic: Alert and oriented Motor: Bilateral upper extremities: 5/5 proximal distal Right lower extremity: Hip flexion, knee extension 3+/5, ankle dorsiflexion 2/5 LLE 4-/5 proximal to distal, unchanged Psych: Normal mood.  Normal behavior.  Assessment/Plan: 1. Functional deficits secondary to thoracic myelopathy which require 3+ hours per day of interdisciplinary therapy in a comprehensive inpatient rehab setting.  Physiatrist is providing close team supervision and 24 hour management of active medical problems listed below.  Physiatrist and rehab team continue to assess barriers to discharge/monitor patient progress toward functional and medical goals  Care Tool:  Bathing    Body parts bathed by patient: Right arm, Left arm, Chest, Abdomen, Right upper leg, Left upper leg, Face, Front perineal area, Left lower leg, Right lower leg, Buttocks   Body parts bathed by helper: Buttocks     Bathing assist Assist Level: Independent with assistive device     Upper Body Dressing/Undressing Upper body dressing   What is the patient wearing?: Pull over shirt    Upper body assist Assist Level: Set up assist    Lower Body Dressing/Undressing Lower body dressing      What is the patient wearing?: Pants     Lower body assist Assist for lower body dressing: Contact Guard/Touching assist     Toileting Toileting    Toileting assist Assist for toileting: Minimal Assistance - Patient > 75% Assistive Device Comment: (Urinal)   Transfers Chair/bed transfer  Transfers assist     Chair/bed transfer assist level: (P) Contact Guard/Touching assist     Locomotion Ambulation   Ambulation assist   Ambulation activity did not occur: Safety/medical concerns  Assist level: Minimal Assistance - Patient > 75% Assistive device: Walker-rolling Max distance: 20'   Walk 10 feet activity   Assist  Walk 10 feet activity did not occur: Safety/medical concerns  Assist level: Minimal Assistance - Patient > 75% Assistive device: Walker-rolling   Walk 50 feet activity   Assist Walk 50 feet with 2 turns activity did not occur: Safety/medical concerns         Walk 150 feet activity   Assist Walk 150 feet activity did not occur: Safety/medical concerns         Walk 10 feet on uneven surface  activity   Assist Walk 10 feet on uneven surfaces activity did not occur: Safety/medical concerns         Wheelchair     Assist Will patient use wheelchair at  discharge?: Yes Type of Wheelchair: Manual    Wheelchair assist level: Independent Max wheelchair distance: 150'    Wheelchair 50 feet with 2 turns activity    Assist        Assist Level: Independent   Wheelchair 150 feet activity     Assist     Assist Level: Independent    Medical Problem List and Plan:  1.Progressive  quadriparesissecondary to thoracic lumbar myelopathy status post T5-6 laminectomy, T11-12 laminectomy, L3-4 laminectomy 05/14/2019 as well as history of prior cervical discectomy and thoracic laminectomy in the past. No brace required  Continue CIR PT, OT, team conference in a.m.   PRAFO RLE to help stretch heel cord 2. Antithrombotics: -DVT/anticoagulation:SCDs.Chronic Coumadin resumed    INR therapeutic on 7/3, Hgb normal  -antiplatelet therapy: N/A 3. Pain Management:Neurontin 300 mg 3 times daily, Flexeril and oxycodone as needed 4. Mood:Provide emotional support -antipsychotic agents: N/A 5. Neuropsych: This patientis capable of making decisions on hisown behalf. 6. Skin/Wound Care:  -continue dressing  7. Fluids/Electrolytes/Nutrition:encourage PO 8.History of clotting disorder pulmonary emboli DVT. coumadin resumed 9.Hypertension. Toprol-XL 50 mg daily, Lasix 40 mg daily.  -continue current regimen   Vitals:   05/31/19 1922 06/01/19 0510  BP: 122/68 119/70  Pulse: 84 78  Resp: 18 15  Temp: 98.3 F (36.8 C) 98.2 F (36.8 C)  SpO2: 100% 100%  controlled 7/6 10. Morbid Obesity: Encouraged weight loss 11.  Diabetes mellitus type 2   CBG (last 3)  Recent Labs    05/31/19 1702 05/31/19 2126 06/01/19 0621  GLUCAP 140* 179* 145*  fair control no home meds listed  12.  Hyponatremia  Sodium 134 on 6/26, labs ordered for tomorrow  Continue to monitor    13.  Drug-induced constipation  Bowel regimen increased on 7/4, increased again on 7/5  LOS: 11 days A FACE TO [place] E Dawanda Mapel 06/01/2019, 8:10 AM

## 2019-06-01 NOTE — Progress Notes (Signed)
Occupational Therapy Session Note  Patient Details  Name: Nicolas [name] MRN: [id number] Date of Birth: November 08, 1966  Today's Date: 06/01/2019 OT Individual Time: 1045-1130 and 1419-1434 OT Individual Time Calculation (min): 45 min and 15 min OT Missed Time: 30 min (pt fatigue)   Short Term Goals: Week 2:  OT Short Term Goal 1 (Week 2): STG=LTG due to LOS  Skilled Therapeutic Interventions/Progress Updates:    Session One: Pt seen for OT session focusing on functional mobility and UE strengthening. Pt sitting up in w/c upon arrival. Complaints of constant 5/10 pain in back, reports being up to date on pain medication and denying intervention.  He self propelled w/c throughout unit mod I. Completed laundry task sit>stand using standard washer supervision-mod I level. Reports doing it in this method at home.  In therapy gym, he completed obstacle course of ambulating weaving through cones in simulation of more functional/realistic environment. Completed with supervision x4 trials of ~75ft each trial with seated rest break btwn trials. VCs to lessen UE reliance on RW. Positioned in supine on therapy mat. Completed UE strengthening exercises using #4 dowel rod as follows, x3 sets of 12:  Chest press  Overhead press  Bicep curls  Pt returned to w/c with supervision and self-propelled back to room. Pt left sitting up in w/c at end of session, all needs in reach.   Session Two: Pt seen for OT session focusing on IADL re-training. Pt falling asleep in w/c upon arrival, voiced fatigue from previous tx sessions, requesting to "take it easy" this session. He had laundry in pt laundry facility needing to come out. He self- propelled w/c  Throughout unit mod I. He gathered laundry items from dryer from w/c level using reacher mod I. He folded laundry seated in w/c independently.  Pt returned to room and requested to end session due to fatigue. He ambulated into room with supervision using RW and  transitioned to EOB. Doffed shoes with use of reacher and returned to supine mod I.  Pt left in supine at end of session, all needs in reach and bed alarm on.  Pt missed 30 minutes of tx 2/2 fatigue.   Therapy Documentation Precautions:  Precautions Precautions: Fall, Back Precaution Comments: revirewed BLT percautions  Restrictions Weight Bearing Restrictions: No   Therapy/Group: Individual Therapy  Clotine Heiner L 06/01/2019, 7:10 AM

## 2019-06-01 NOTE — Progress Notes (Signed)
ANTICOAGULATION CONSULT NOTE - Follow Up Consult  Pharmacy Consult for Warfarin Indication: h/o recurrent VTE   Allergies  Allergen Reactions  . Ace Inhibitors Other (See Comments)    cough  . Lisinopril Other (See Comments)    cough    Patient Measurements: Height: 5\' 10"  (177.8 cm) Weight: (!) 300 lb 11.3 oz (136.4 kg) IBW/kg (Calculated) : 73   Vital Signs: Temp: 98.2 F (36.8 C) (07/06 0510) BP: 119/70 (07/06 0510) Pulse Rate: 78 (07/06 0510)  Labs: Recent Labs    06/01/19 0603  HGB 13.2  HCT 41.5  PLT 326  LABPROT 32.9*  INR 3.3*    Estimated Creatinine Clearance: 133.6 mL/min (by C-G formula based on SCr of 0.9 mg/dL).   Assessment: 81 YOM with progressive lower extremity weakness s/p laminectomies on 05/14/19. Pharmacy consulted to resume warfarin on 6/23 from PTA for history of recurrent VTE.   Home Warfarin dose: 5mg  daily except 10mg  on Tues/Thurs/Sun  INR = 3.3 today,  SUPRAtherapeutic. CBC stable (on 7/6).  No bleeding noted.   Goal of Therapy:  INR 2-3 Monitor platelets by anticoagulation protocol: Yes   Plan:    Adjust home warfarin regimen to 5mg  daily except 10mg  on Thurs/Sun (decreasing Tuesday from 10mg  to 5mg )  Continue 5mg  today  PT/INR on Mon/Wed/Fri  Monitor closely for s/sx of bleeding  Riddhi Grether A. Levada Dy, PharmD, [place] Please utilize Amion for appropriate phone number to reach the unit pharmacist ([facility])   05/29/2019 11:35 AM

## 2019-06-02 ENCOUNTER — Inpatient Hospital Stay (HOSPITAL_COMMUNITY): Payer: Medicare HMO | Admitting: Occupational Therapy

## 2019-06-02 ENCOUNTER — Inpatient Hospital Stay (HOSPITAL_COMMUNITY): Payer: Medicare HMO | Admitting: Physical Therapy

## 2019-06-02 LAB — GLUCOSE, CAPILLARY
Glucose-Capillary: 151 mg/dL — ABNORMAL HIGH (ref 70–99)
Glucose-Capillary: 147 mg/dL — ABNORMAL HIGH (ref 70–99)
Glucose-Capillary: 119 mg/dL — ABNORMAL HIGH (ref 70–99)
Glucose-Capillary: 201 mg/dL — ABNORMAL HIGH (ref 70–99)

## 2019-06-02 NOTE — Progress Notes (Signed)
Physical Therapy Discharge Summary  Patient Details  Name: Nicolas [name] MRN: [id number] Date of Birth: 1966/03/06  Today's Date: 06/02/2019 PT Individual Time:1130-1200; 1300-1400 PT Individual Time Calculation (min): 30 min and 60 min    Patient has met 7 of 7 long term goals due to improved activity tolerance, improved balance, improved postural control, increased strength, increased range of motion and ability to compensate for deficits.  Patient to discharge at a wheelchair level Modified Independent.   Patient's care partner is not necessary as pt is at mod I level at discharge.  Reasons goals not met: Pt has met all rehab goals.  Recommendation:  Patient will benefit from ongoing skilled PT services in home health setting to continue to advance safe functional mobility, address ongoing impairments in LE strength, coordination, endurance, balance, safety, and minimize fall risk.  Equipment: No equipment provided. Pt already owns all necessary equipment needed at d/c.  Reasons for discharge: treatment goals met and discharge from hospital  Patient/family agrees with progress made and goals achieved: Yes   Skilled Intervention: Session 1: Pt received seated in w/c in room, agreeable to PT session. No complaints of pain. Manual w/c propulsion mod I x 150', x200'. Independent for management of w/c parts. Demonstration of how to perform a safe car transfer with RW. Pt requests to attempt to perform car transfer how he did it prior to admission. Pt uses car door and w/c armrests for support and demonstrates decreased safety and stability with this transfer without use of RW. Education with pt that it is not safe to use the car door to assist with his balance during the transfer. Demonstrated how to perform squat pivot transfer w/c to/from car if pt wishes to perform transfer without RW. Pt reports he prefers to use RW vs performing squat pivot. Pt is able to perform safe car transfer w/c  to/from car with RW and setup A. Pt left seated in w/c in room with needs in reach at end of session.  Session 2: Pt received seated in w/c in room, agreeable to PT session. No complaints of pain. Sit to stand mod I to RW. Orthotist here from [place] to provide AFO and assess pt's gait with AFO. Ambulation x 30', x40' with RW at mod I level. Pt demos improved RLE clearance as well as improved ankle stability with use of AFO. Pt does exhibit decreased RLE dorsiflexion, education with patient about wearing AFO and PRAFO to improve ankle ROM and prevent contracture. Also educated pt on importance of skin checks to prevent blisters and skin breakdown due to AFO use especially with pt having impaired sensation in LE. Standing BLE coordination: alt L/R target taps with BUE support and mod I for balance. Static standing balance with one UE support on RW and SBA while performing horseshoe toss with alt UE. Pt left seated in w/c in room with needs in reach at end of session.   PT Discharge Precautions/Restrictions Precautions Precautions: Fall;Back Precaution Comments: BLT Restrictions Weight Bearing Restrictions: No Vital Signs Therapy Vitals Temp: 98.4 F (36.9 C) Temp Source: Oral Pulse Rate: 87 Resp: 19 BP: 126/64 Patient Position (if appropriate): Lying Oxygen Therapy SpO2: 100 % O2 Device: Room Air Pain Pain Assessment Pain Scale: 0-10 Pain Score: 7  Pain Type: Surgical pain Pain Location: Back Pain Orientation: Lower Pain Radiating Towards: legs Pain Descriptors / Indicators: Aching;Constant;Discomfort Pain Frequency: Constant Pain Onset: On-going Patients Stated Pain Goal: 3 Pain Intervention(s): Medication (See eMAR) Vision/Perception  Vision -  History Baseline Vision: No visual deficits Perception Perception: Within Functional Limits Praxis Praxis: Intact  Cognition Overall Cognitive Status: Within Functional Limits for tasks assessed Arousal/Alertness:  Awake/alert Orientation Level: Oriented X4 Attention: Focused Focused Attention: Appears intact Memory: Appears intact Awareness: Appears intact Problem Solving: Appears intact Safety/Judgment: Appears intact Sensation Sensation Light Touch: Impaired Detail Light Touch Impaired Details: Impaired RLE;Impaired LLE(proximal > distal; improved since sx) Proprioception: Impaired Detail Proprioception Impaired Details: Impaired RLE;Impaired LLE Coordination Gross Motor Movements are Fluid and Coordinated: No Fine Motor Movements are Fluid and Coordinated: Yes Coordination and Movement Description: impaired 2/2 ataxia and generalized weakness/deconditioning Motor  Motor Motor: Ataxia;Abnormal tone Motor - Discharge Observations: paraparesis and generalized deconditioning  Mobility Bed Mobility Bed Mobility: Rolling Right;Rolling Left;Supine to Sit;Sit to Supine Rolling Right: Independent with assistive device Rolling Left: Independent with assistive device Right Sidelying to Sit: Independent with assistive device Supine to Sit: Independent with assistive device Sit to Supine: Independent with assistive device Sit to Sidelying Right: Independent with assistive device Transfers Transfers: Sit to Stand;Stand Pivot Transfers Sit to Stand: Independent with assistive device Stand Pivot Transfers: Independent with assistive device Transfer (Assistive device): Rolling walker Locomotion  Gait Ambulation: Yes Gait Assistance: Independent with assistive device Gait Distance (Feet): 40 Feet Assistive device: Rolling walker Gait Gait: Yes Gait Pattern: Impaired Gait Pattern: Step-to pattern;Decreased hip/knee flexion - right;Decreased dorsiflexion - right;Right circumduction;Trunk flexed;Wide base of support Ankle - Swing Phase- Impaired Gait Pattern: Excessive inversion - Right Gait velocity: decreased Stairs / Additional Locomotion Stairs: No Wheelchair Mobility Wheelchair Mobility:  Yes Wheelchair Assistance: Independent with Camera operator: Both upper extremities Wheelchair Parts Management: Independent  Trunk/Postural Assessment  Cervical Assessment Cervical Assessment: Exceptions to WFL(forward head) Thoracic Assessment Thoracic Assessment: Exceptions to WFL(back precautions) Lumbar Assessment Lumbar Assessment: Exceptions to WFL(back precautions) Postural Control Postural Control: Within Functional Limits  Balance Balance Balance Assessed: Yes Static Sitting Balance Static Sitting - Balance Support: No upper extremity supported;Feet supported Static Sitting - Level of Assistance: 6: Modified independent (Device/Increase time) Dynamic Sitting Balance Dynamic Sitting - Balance Support: No upper extremity supported;Feet supported;During functional activity Dynamic Sitting - Level of Assistance: 6: Modified independent (Device/Increase time) Sitting balance - Comments: Sitting on tub transfer bench to complete bathing task Static Standing Balance Static Standing - Balance Support: Bilateral upper extremity supported;During functional activity Static Standing - Level of Assistance: 6: Modified independent (Device/Increase time) Static Standing - Comment/# of Minutes: Standing at [employer] Dynamic Standing Balance Dynamic Standing - Balance Support: Bilateral upper extremity supported;During functional activity Dynamic Standing - Level of Assistance: 6: Modified independent (Device/Increase time) Dynamic Standing - Comments: Standing with RW to complete LB clothing management tasks Extremity Assessment  RUE Assessment RUE Assessment: Within Functional Limits LUE Assessment LUE Assessment: Within Functional Limits RLE Assessment RLE Assessment: Exceptions to [facility] Passive Range of Motion (PROM) Comments: WFL General Strength Comments: impaired, see below RLE Strength Right Hip Flexion: 3+/5 Right Knee Flexion: 4/5 Right Knee Extension:  4/5 Right Ankle Dorsiflexion: 1/5 LLE Assessment LLE Assessment: Exceptions to [facility] Passive Range of Motion (PROM) Comments: WFL General Strength Comments: impaired, see below LLE Strength Left Hip Flexion: 4/5 Left Knee Flexion: 4+/5 Left Knee Extension: 5/5 Left Ankle Dorsiflexion: 3/5     Excell Seltzer, PT, DPT 06/02/2019, 3:36 PM

## 2019-06-02 NOTE — Progress Notes (Signed)
[facility] PHYSICAL MEDICINE & REHABILITATION PROGRESS NOTE   Subjective/Complaints: No BMs x2 days but thinks he may have a bowel movement today, no abdominal pain  ROS: - Constipation.  Denies CP, shortness of breath, nausea, vomiting, diarrhea.  Objective:   No results found. Recent Labs    06/01/19 0603  WBC 7.4  HGB 13.2  HCT 41.5  PLT 326   No results for input(s): NA, K, CL, CO2, GLUCOSE, BUN, CREATININE, CALCIUM in the last 72 hours.  Intake/Output Summary (Last 24 hours) at 06/02/2019 1804 Last data filed at 06/02/2019 1300 Gross per 24 hour  Intake 700 ml  Output -  Net 700 ml     Physical Exam: Vital Signs Blood pressure 126/64, pulse 87, temperature 98.4 F (36.9 C), temperature source Oral, resp. rate 19, height 5\' 10"  (1.778 m), weight (!) 136.4 kg, SpO2 100 %. Constitutional: No distress . Vital signs reviewed. HENT: Normocephalic.  Atraumatic. Eyes: EOMI.  No discharge. Cardiovascular: No JVD. Respiratory: Normal effort. GI: Non-distended. Musc: No edema or tenderness in extremities. Neurologic: Alert and oriented Motor: Bilateral upper extremities: 5/5 proximal distal Right lower extremity: Hip flexion, knee extension 3+/5, ankle dorsiflexion 2/5 LLE 4-/5 proximal to distal, unchanged Psych: Normal mood.  Normal behavior.  Assessment/Plan: 1. Functional deficits secondary to thoracic myelopathy which require 3+ hours per day of interdisciplinary therapy in a comprehensive inpatient rehab setting.  Physiatrist is providing close team supervision and 24 hour management of active medical problems listed below.  Physiatrist and rehab team continue to assess barriers to discharge/monitor patient progress toward functional and medical goals  Care Tool:  Bathing    Body parts bathed by patient: Right arm, Left arm, Chest, Abdomen, Right upper leg, Left upper leg, Face, Front perineal area, Left lower leg, Right lower leg, Buttocks   Body parts bathed  by helper: Buttocks     Bathing assist Assist Level: Independent with assistive device     Upper Body Dressing/Undressing Upper body dressing   What is the patient wearing?: Pull over shirt    Upper body assist Assist Level: Independent    Lower Body Dressing/Undressing Lower body dressing      What is the patient wearing?: Pants     Lower body assist Assist for lower body dressing: Independent with assitive device     Toileting Toileting    Toileting assist Assist for toileting: Independent with assistive device Assistive Device Comment: (Urinal)   Transfers Chair/bed transfer  Transfers assist     Chair/bed transfer assist level: Independent with assistive device Chair/bed transfer assistive device: Walker, Armrests   Locomotion Ambulation   Ambulation assist   Ambulation activity did not occur: Safety/medical concerns  Assist level: Independent with assistive device Assistive device: Walker-rolling Max distance: 61ft   Walk 10 feet activity   Assist  Walk 10 feet activity did not occur: Safety/medical concerns  Assist level: Independent with assistive device Assistive device: Walker-rolling   Walk 50 feet activity   Assist Walk 50 feet with 2 turns activity did not occur: Safety/medical concerns  Assist level: Independent with assistive device Assistive device: Walker-rolling    Walk 150 feet activity   Assist Walk 150 feet activity did not occur: Safety/medical concerns         Walk 10 feet on uneven surface  activity   Assist Walk 10 feet on uneven surfaces activity did not occur: Safety/medical concerns         Wheelchair     Assist Will  patient use wheelchair at discharge?: Yes Type of Wheelchair: Manual    Wheelchair assist level: Independent Max wheelchair distance: 200'    Wheelchair 50 feet with 2 turns activity    Assist        Assist Level: Independent   Wheelchair 150 feet activity      Assist     Assist Level: Independent    Medical Problem List and Plan:  1.Progressive quadriparesissecondary to thoracic lumbar myelopathy status post T5-6 laminectomy, T11-12 laminectomy, L3-4 laminectomy 05/14/2019 as well as history of prior cervical discectomy and thoracic laminectomy in the past. No brace required  Continue CIR PT, OT, team conference today   PRAFO RLE to help stretch heel cord 2. Antithrombotics: -DVT/anticoagulation:SCDs.Chronic Coumadin resumed    INR therapeutic on 7/3, Hgb normal  -antiplatelet therapy: N/A 3. Pain Management:Neurontin 300 mg 3 times daily, Flexeril and oxycodone as needed 4. Mood:Provide emotional support -antipsychotic agents: N/A 5. Neuropsych: This patientis capable of making decisions on hisown behalf. 6. Skin/Wound Care:  -continue dressing  7. Fluids/Electrolytes/Nutrition:encourage PO 8.History of clotting disorder pulmonary emboli DVT. coumadin resumed 9.Hypertension. Toprol-XL 50 mg daily, Lasix 40 mg daily.  -continue current regimen   Vitals:   06/02/19 0451 06/02/19 1456  BP: 126/76 126/64  Pulse: 90 87  Resp: 14 19  Temp: 98.7 F (37.1 C) 98.4 F (36.9 C)  SpO2:  100%  controlled 7/7 10. Morbid Obesity: Encouraged weight loss 11.  Diabetes mellitus type 2   CBG (last 3)  Recent Labs    06/01/19 2106 06/02/19 0637 06/02/19 1207  GLUCAP 169* 151* 147*  Continue diet 12.  Hyponatremia  Sodium 134 on 6/26, labs ordered for tomorrow  Continue to monitor    13.  Drug-induced constipation  Bowel regimen increased on 7/4, increased again on 7/5  LOS: 12 days A FACE TO [place] E  06/02/2019, 6:04 PM

## 2019-06-02 NOTE — Progress Notes (Signed)
Occupational Therapy Discharge Summary  Patient Details  Name: Nicolas [name] MRN: [id number] Date of Birth: 05-14-1966   Patient has met 23 of 13 long term goals due to improved activity tolerance, improved balance, postural control and improved coordination.  Patient to discharge at overall Modified Independent level.  Patient lives with adult son who is able to provide PRN assistance. Pt reports being back near PLOF. He uses power w/c within the home, short distance ambulation and standing with RW mod I. Pt with excellent safety awareness and awareness of deficits, able to safely complete and modify ADLs/IADLs as needed.    Recommendation:  Patient will benefit from ongoing skilled OT services in home health setting to continue to advance functional skills in the area of BADL and Reduce care partner burden.  Equipment: Heavy duty BSC  Reasons for discharge: treatment goals met and discharge from hospital  Patient/family agrees with progress made and goals achieved: Yes  OT Discharge Vision Baseline Vision/History: No visual deficits Patient Visual Report: No change from baseline Vision Assessment?: No apparent visual deficits Perception  Perception: Within Functional Limits Praxis Praxis: Intact Cognition Overall Cognitive Status: Within Functional Limits for tasks assessed Arousal/Alertness: Awake/alert Orientation Level: Oriented X4 Focused Attention: Appears intact Memory: Appears intact Awareness: Appears intact Problem Solving: Appears intact Safety/Judgment: Appears intact Sensation Sensation Light Touch Impaired Details: Impaired RLE;Impaired LLE(Proximal>distal) Proprioception Impaired Details: Impaired RLE;Impaired LLE Coordination Gross Motor Movements are Fluid and Coordinated: No Fine Motor Movements are Fluid and Coordinated: Yes Coordination and Movement Description: Imapired 2/2 ataxia and generalized weakness/deconditioning Motor  Motor Motor:  Ataxia Motor - Discharge Observations: Paraparesis and generalized deconditioning Trunk/Postural Assessment  Cervical Assessment Cervical Assessment: Exceptions to WFL(Forward head) Thoracic Assessment Thoracic Assessment: Exceptions to WFL(Back pre-cautions (no brace)) Lumbar Assessment Lumbar Assessment: Exceptions to WFL(Back pre-cautions (no brace)) Postural Control Postural Control: Within Functional Limits  Balance Balance Balance Assessed: Yes Static Sitting Balance Static Sitting - Balance Support: Feet supported;No upper extremity supported Static Sitting - Level of Assistance: 7: Independent Dynamic Sitting Balance Dynamic Sitting - Balance Support: Feet supported;During functional activity Dynamic Sitting - Level of Assistance: 6: Modified independent (Device/Increase time) Sitting balance - Comments: Sitting on tub transfer bench to complete bathing task Static Standing Balance Static Standing - Balance Support: During functional activity;Right upper extremity supported;Left upper extremity supported Static Standing - Level of Assistance: 6: Modified independent (Device/Increase time) Static Standing - Comment/# of Minutes: Standing at [employer] Dynamic Standing Balance Dynamic Standing - Balance Support: During functional activity;Right upper extremity supported;Left upper extremity supported Dynamic Standing - Level of Assistance: 6: Modified independent (Device/Increase time) Dynamic Standing - Comments: Standing with RW to complete LB clothing management tasks Extremity/Trunk Assessment RUE Assessment RUE Assessment: Within Functional Limits LUE Assessment LUE Assessment: Within Functional Limits   Charlette Hennings L 06/02/2019, 12:07 PM

## 2019-06-02 NOTE — Progress Notes (Signed)
Physical Therapy Session Note  Patient Details  Name: Nicolas [name] MRN: [id number] Date of Birth: 1966/11/21  Today's Date: 06/02/2019 PT Individual Time: 1526-1640 PT Individual Time Calculation (min): 74 min   Short Term Goals: Week 2:  PT Short Term Goal 1 (Week 2): =LTG due to ELOS  Skilled Therapeutic Interventions/Progress Updates:    Pt received sitting in w/c and agreeable to therapy session. Stand pivot w/c>EOB using RW mod-I. Pt educated on how to place AFO in shoe correctly and educated on importance of placing sole of shoe on top of AFO once it will fit or to get wider shoes for skin integrity/pressure relief. Pt attempted to don/doff shoe with AFO in place but unable without assistance despite multiple attempts. Sit<>supine mod-I using bed features throughout session as pt will have hospital bed at home. Performed pt's provided HEP with education on proper form/technique of exercises as well as written cues to improve pt independence with exercises in the home. Performed the following exercises 1 set of 10 repetitions on each LE with pt demonstrating increased difficulty performing them with R LE compared to L due to impaired strength: - supine heel slides; educated pt on use of sheet at home to provide assist via B UEs for R LE heel slide - short arch quads; pt demonstrating ability to place towel roll behind knee independently using bed features with cuing on placement - supine quadriceps isometric contraction - multimodal cuing for sustain R LE quad contraction - supine hip abduction - cuing for maintaining hip alignment and avoiding excessive hip external rotation during exercise  - supine gluteal sets - seated long arch quads - seated hamstring curls with level 1 theraband resistance for L LE and no resistance for R LE - pt reports he has therabands that will attach to a door where he can do this exercise at home - seated hip adductor isometric contraction  - seated hip  abduction against level 1 theraband resistance on both LEs with pt reporting prior he was unable to successfully move R LE against the resistance Ambulated 39ft using RW mod-I with w/c follow for safety and pt demonstrating decreased B LE step length, decreased gait speed, and heavy reliance on B UE support. Sit>supine mod-I. Pt left supine in bed with needs in reach and bed alarm on.   Therapy Documentation Precautions:  Precautions Precautions: Fall, Back Precaution Comments: BLT Restrictions Weight Bearing Restrictions: No  Pain: Denies pain during session reporting recent medication administration.   Therapy/Group: Individual Therapy  Tawana Scale, PT, DPT 06/02/2019, 3:49 PM

## 2019-06-02 NOTE — Progress Notes (Signed)
Occupational Therapy Session Note  Patient Details  Name: Nicolas [name] MRN: [id number] Date of Birth: 12-Nov-1966  Today's Date: 06/02/2019 OT Individual Time: 7026-3785 OT Individual Time Calculation (min): 45 min    Short Term Goals: Week 2:  OT Short Term Goal 1 (Week 2): STG=LTG due to LOS  Skilled Therapeutic Interventions/Progress Updates:    Pt seen for OT ADL bathing/dressing session. Pt awake in supine upon arrival, agreeable to tx session. Pt denied pain and reports being pre-medicated prior to tx session. He transferred to sitting EOB mod I using hospital bed functions. Completed stand pivot transfers throughout session mod I with RW. Completed EOB>w/c and w/c>standard toilet. Unable to void on toilet. Completed short distance ambulation, ~38ft to TTB and bathed seated on bench with set-up assist.  He returned to EOB to dress, mod I with assist to don TED hose. Sit>stand to pull pants up and stand pivot back to w/c. Grooming tasks completed from w/c level mod I. Pt left seated in w/c at end of session, all needs in reach.  Pt voices feeling comfortable and confident with planned d/c home tomorrow at mod I level. Education provided regarding DME, continuum of care, and d/c planning.   Therapy Documentation Precautions:  Precautions Precautions: Fall, Back Precaution Comments: revirewed BLT percautions  Restrictions Weight Bearing Restrictions: No   Therapy/Group: Individual Therapy  Alize Acy L 06/02/2019, 7:01 AM

## 2019-06-03 LAB — PROTIME-INR
INR: 3.3 — ABNORMAL HIGH (ref 0.8–1.2)
Prothrombin Time: 33 seconds — ABNORMAL HIGH (ref 11.4–15.2)

## 2019-06-03 LAB — GLUCOSE, CAPILLARY: Glucose-Capillary: 138 mg/dL — ABNORMAL HIGH (ref 70–99)

## 2019-06-03 MED ORDER — GABAPENTIN 300 MG PO CAPS: 300.0000 mg | ORAL_CAPSULE | Freq: Three times a day (TID) | ORAL | 1 refills | Status: DC

## 2019-06-03 MED ORDER — OXYCODONE HCL 10 MG PO TABS: 10.0000 mg | ORAL_TABLET | ORAL | 0 refills | Status: DC | PRN

## 2019-06-03 MED ORDER — CYCLOBENZAPRINE HCL 10 MG PO TABS: 10.0000 mg | ORAL_TABLET | Freq: Three times a day (TID) | ORAL | 0 refills | Status: DC | PRN

## 2019-06-03 MED ORDER — ACETAMINOPHEN 325 MG PO TABS: 650.0000 mg | ORAL_TABLET | ORAL | Status: DC | PRN

## 2019-06-03 MED ORDER — POTASSIUM CHLORIDE CRYS ER 20 MEQ PO TBCR: 20.0000 meq | EXTENDED_RELEASE_TABLET | ORAL | 0 refills | Status: DC

## 2019-06-03 MED ORDER — POLYETHYLENE GLYCOL 3350 17 G PO PACK: 17.0000 g | PACK | Freq: Two times a day (BID) | ORAL | 0 refills | Status: DC

## 2019-06-03 MED ORDER — FUROSEMIDE 40 MG PO TABS: 40.0000 mg | ORAL_TABLET | Freq: Every day | ORAL | 0 refills | Status: DC

## 2019-06-03 MED ORDER — METOPROLOL SUCCINATE ER 50 MG PO TB24: 50.0000 mg | ORAL_TABLET | ORAL | 0 refills | Status: DC

## 2019-06-03 MED ORDER — WARFARIN SODIUM 2.5 MG PO TABS: 2.5000 mg | ORAL_TABLET | Freq: Once | ORAL | Status: DC

## 2019-06-03 MED ORDER — BISACODYL 10 MG RE SUPP: 10.0000 mg | Freq: Every day | RECTAL | 0 refills | Status: DC | PRN

## 2019-06-03 NOTE — Progress Notes (Signed)
Social Work Discharge Note   The overall goal for the admission was met for:   Discharge location: Yes - home with son and other family members able to assist as needed.  Length of Stay: Yes - 13 days  Discharge activity level: Yes - modified independent w/c level and supervision ambulating.  Home/community participation: Yes  Services provided included: MD, RD, PT, OT, RN, Pharmacy and [place]: [employer]  Follow-up services arranged: Home Health: RN, PT, OT via [facility], DME: heavy duty drop arm commode via [facility] and Patient/Family has no preference for HH/DME agencies  Comments (or additional information):     Contact info:  Pt @ [phone]  Patient/Family verbalized understanding of follow-up arrangements: Yes  Individual responsible for coordination of the follow-up plan: pt  Confirmed correct DME delivered: Darrelle Wiberg 06/03/2019    Orpha Dain

## 2019-06-03 NOTE — Progress Notes (Signed)
ANTICOAGULATION CONSULT NOTE - Follow Up Consult  Pharmacy Consult for Warfarin Indication: h/o recurrent VTE   Allergies  Allergen Reactions  . Ace Inhibitors Other (See Comments)    cough  . Lisinopril Other (See Comments)    cough    Patient Measurements: Height: 5\' 10"  (177.8 cm) Weight: (!) 300 lb 11.3 oz (136.4 kg) IBW/kg (Calculated) : 73   Vital Signs: Temp: 98.1 F (36.7 C) (07/08 0603) BP: 134/74 (07/08 0603) Pulse Rate: 87 (07/08 0603)  Labs: Recent Labs    06/01/19 0603 06/03/19 0623  HGB 13.2  --   HCT 41.5  --   PLT 326  --   LABPROT 32.9* 33.0*  INR 3.3* 3.3*    Estimated Creatinine Clearance: 133.6 mL/min (by C-G formula based on SCr of 0.9 mg/dL).   Assessment: 33 YOM with progressive lower extremity weakness s/p laminectomies on 05/14/19. Pharmacy consulted to resume warfarin on 6/23 from PTA for history of recurrent VTE.   Home Warfarin dose: 5mg  daily except 10mg  on Tues/Thurs/Sun  INR = 3.3 again today,  SUPRAtherapeutic. CBC stable (on 7/6).  No bleeding noted.   Goal of Therapy:  INR 2-3 Monitor platelets by anticoagulation protocol: Yes   Plan:    Warfarin 2.5mg  PO today  Change PT/INR to daily for now.   Monitor closely for s/sx of bleeding  Kayah Hecker A. Levada Dy, PharmD, [place] Please utilize Amion for appropriate phone number to reach the unit pharmacist ([place])   05/29/2019 11:35 AM

## 2019-06-03 NOTE — Patient Care Conference (Signed)
Inpatient RehabilitationTeam Conference and Plan of Care Update Date: 06/02/2019   Time: 10:00 AM    Patient Name: Nicolas [name]      Medical Record Number: [id number]  Date of Birth: 10/27/1966 Sex: Male         Room/Bed: 2E26S/3M19Q-22 Payor Info: Payor: [employer] MEDICARE / Plan: [employer] MEDICARE HMO / Product Type: *No Product type* /    Admitting Diagnosis: 1. SCI team NTSCI, thoracic myelopathy, 16-18 days  Admit Date/Time:  05/21/2019  5:35 PM Admission Comments: No comment available   Primary Diagnosis:  <principal problem not specified> Principal Problem: <principal problem not specified>  Patient Active Problem List   Diagnosis Date Noted  . Drug induced constipation   . Hyponatremia   . Labile blood glucose   . Diabetes mellitus type 2 in obese ([place])   . Morbid obesity ([place])   . Thoracic myelopathy 05/14/2019  . Cervical spondylosis with myelopathy 01/26/2019  . Class 3 severe obesity due to excess calories with serious comorbidity and body mass index (BMI) of 40.0 to 44.9 in adult ([place]) 10/10/2017  . Prediabetes 10/10/2017  . Debility 10/10/2017  . Able to mobilize using indoor motorized wheelchair 10/10/2017  . Frequent falls 02/01/2017  . At high risk for falls 03/12/2016  . Hypoxemia 05/23/2014  . Chronic anticoagulation 05/21/2014  . Spinal stenosis 12/02/2013  . Venous stasis dermatitis 09/26/2012  . Encounter for long-term (current) use of anticoagulants 03/26/2012  . Spinal stenosis of lumbar region 03/06/2012  . HTN (hypertension) 03/06/2012  . Hx pulmonary embolism 03/06/2012  . Lower extremity edema 03/06/2012    Expected Discharge Date: Expected Discharge Date: 06/03/19  Team Members Present: Physician leading conference: Dr. Delice Lesch Social Worker Present: Lennart Pall, LCSW Nurse Present: Dorthula Nettles, RN PT Present: Excell Seltzer, PT OT Present: Amy Rounds, OT SLP Present: Charolett Bumpers, SLP PPS Coordinator present : Gunnar Fusi, SLP     Current Status/Progress Goal Weekly Team Focus  Medical             Bowel/Bladder   Continent of Bowel and bladder  Remain continent of bowel and bladder      Swallow/Nutrition/ Hydration             ADL's   Supervision-mod I stand pivot transfers using RW; mod I UB/LB bathing/dressing and toileting task using AE  CGA standing balance and shower transfers, supervision toileting and toilet transfers, mod I ADLs from w/c level  ADL re-training, d/c planning, pt to d/c home tomorrow   Mobility   mod I bed mobility and w/c mobility, Supervision SPT with RW, min A gait 25' RW  mod I at w/c level and with transfers, min A short distance gait with RW  gait training, BLE NMR   Communication             Safety/Cognition/ Behavioral Observations            Pain   8/10 mid lower back pain. Oxycodone 10mg  given at 0045  2/10  assess pain qshift/prn, medicate prn   Skin   surgical incision on lower back. ABD pad daily  dressing in place  remains intact, free from infection and breakdown.  assess surgical incision qshift and prn    Rehab Goals Patient on target to meet rehab goals: Yes *See Care Plan and progress notes for long and short-term goals.     Barriers to Discharge  Current Status/Progress Possible Resolutions Date Resolved   Physician  Nursing                  PT                    OT                  SLP                SW                Discharge Planning/Teaching Needs:  Pt to d/c home with 55 yo son who can provide close to 24/7 assistance.  Other family in area can also assist if pt requires 24/7.  NA   Team Discussion:  Medically stable;  Incisions healing well;  Pain controlled.  Mod independent overall at w/c level. amb >20' with supervision.  Getting brace for right foot.  REady for d/c tomorrow.  Revisions to Treatment Plan:  NA        I attest that I was present, lead the team conference, and concur with the assessment and plan  of the team.   Lennart Pall 06/03/2019, 3:45 PM    Team conference was held via web/ teleconference due to [place] - 19

## 2019-06-03 NOTE — Progress Notes (Signed)
Patient received discharge instructions from Marlowe [place], PA-C with verbal understanding. Patient being discharged to home with patient belongings and family.

## 2019-06-03 NOTE — Discharge Instructions (Signed)
Inpatient Rehab Discharge Instructions  [name] Discharge date and time: No discharge date for patient encounter.   Activities/Precautions/ Functional Status: Activity: activity as tolerated Diet: regular diet Wound Care: keep wound clean and dry Functional status:  ___ No restrictions     ___ Walk up steps independently ___ 24/7 supervision/assistance   ___ Walk up steps with assistance ___ Intermittent supervision/assistance  ___ Bathe/dress independently ___ Walk with walker     _x__ Bathe/dress with assistance ___ Walk Independently    ___ Shower independently ___ Walk with assistance    ___ Shower with assistance ___ No alcohol     ___ Return to work/school ________      COMMUNITY REFERRALS UPON DISCHARGE:    Home Health:   PT     OT     RN                     Agency:  [facility]     Phone: [phone]   Medical Equipment/Items Ordered:  Commode                                                      Agency/Supplier:  [facility] @ [phone]      Special Instructions:  Home health nurse to check INR on 06/05/2019          results to [place] [facility] [facility] [phone] fax [phone]  My questions have been answered and I understand these instructions. I will adhere to these goals and the provided educational materials after my discharge from the hospital.  Patient/Caregiver Signature _______________________________ Date __________  Clinician Signature _______________________________________ Date __________  Please bring this form and your medication list with you to all your follow-up doctor's appointments.

## 2019-06-05 ENCOUNTER — Telehealth: Payer: Self-pay | Admitting: Family Medicine

## 2019-06-05 DIAGNOSIS — G4730 Sleep apnea, unspecified: Secondary | ICD-10-CM | POA: Diagnosis not present

## 2019-06-05 DIAGNOSIS — Z4789 Encounter for other orthopedic aftercare: Secondary | ICD-10-CM | POA: Diagnosis not present

## 2019-06-05 DIAGNOSIS — E1151 Type 2 diabetes mellitus with diabetic peripheral angiopathy without gangrene: Secondary | ICD-10-CM | POA: Diagnosis not present

## 2019-06-05 DIAGNOSIS — M4712 Other spondylosis with myelopathy, cervical region: Secondary | ICD-10-CM | POA: Diagnosis not present

## 2019-06-05 DIAGNOSIS — I10 Essential (primary) hypertension: Secondary | ICD-10-CM | POA: Diagnosis not present

## 2019-06-05 DIAGNOSIS — M48061 Spinal stenosis, lumbar region without neurogenic claudication: Secondary | ICD-10-CM | POA: Diagnosis not present

## 2019-06-05 DIAGNOSIS — M1990 Unspecified osteoarthritis, unspecified site: Secondary | ICD-10-CM | POA: Diagnosis not present

## 2019-06-05 DIAGNOSIS — I872 Venous insufficiency (chronic) (peripheral): Secondary | ICD-10-CM | POA: Diagnosis not present

## 2019-06-05 DIAGNOSIS — F329 Major depressive disorder, single episode, unspecified: Secondary | ICD-10-CM | POA: Diagnosis not present

## 2019-06-05 NOTE — Telephone Encounter (Signed)
Copied from [place] [phone]. Topic: General - Other >> Jun 05, 2019  1:45 PM Leward Quan A wrote: Reason for CRM: Hermenia Fiscal with [facility] called to say that patient INR was 4.2 and Pt was 50.2 he took warfarin (COUMADIN) 10 MG tablet say he took 1/2 at 10 a.m. Olin Hauser can be reached at Ph# [phone]

## 2019-06-09 ENCOUNTER — Telehealth: Payer: Self-pay | Admitting: *Deleted

## 2019-06-09 ENCOUNTER — Telehealth: Payer: Self-pay

## 2019-06-09 DIAGNOSIS — M4712 Other spondylosis with myelopathy, cervical region: Secondary | ICD-10-CM | POA: Diagnosis not present

## 2019-06-09 DIAGNOSIS — Z4789 Encounter for other orthopedic aftercare: Secondary | ICD-10-CM | POA: Diagnosis not present

## 2019-06-09 DIAGNOSIS — F329 Major depressive disorder, single episode, unspecified: Secondary | ICD-10-CM | POA: Diagnosis not present

## 2019-06-09 DIAGNOSIS — I872 Venous insufficiency (chronic) (peripheral): Secondary | ICD-10-CM | POA: Diagnosis not present

## 2019-06-09 DIAGNOSIS — G4730 Sleep apnea, unspecified: Secondary | ICD-10-CM | POA: Diagnosis not present

## 2019-06-09 DIAGNOSIS — M48061 Spinal stenosis, lumbar region without neurogenic claudication: Secondary | ICD-10-CM | POA: Diagnosis not present

## 2019-06-09 DIAGNOSIS — E1151 Type 2 diabetes mellitus with diabetic peripheral angiopathy without gangrene: Secondary | ICD-10-CM | POA: Diagnosis not present

## 2019-06-09 DIAGNOSIS — I10 Essential (primary) hypertension: Secondary | ICD-10-CM | POA: Diagnosis not present

## 2019-06-09 DIAGNOSIS — M1990 Unspecified osteoarthritis, unspecified site: Secondary | ICD-10-CM | POA: Diagnosis not present

## 2019-06-09 NOTE — Telephone Encounter (Signed)
Randy with [facility] and stated that the pt's INR was done last week and called to a Provider and he was unsure who as he did not obtain it but another coworker did, he stated the INR was 4.2 and it must have been called to the pt's Primary Care physician. He stated he went to see the pt today and gathered all this information and got the main number from the pt to call us regarding this. He stated I have waited on the line for 35 minutes and I apologized for that and gave him our number.  He stated the pt has not resumed his Coumadin and wanted to know what he should tell the patient. Advised that since I do not know what the INR is today that the pt should resume at normal dose and that I would like an INR as soon as possible and he would be able to go out tomorrow before 3pm to obtain. Pt is receiving Home Health post a Laminectomy. Faxed orders over to [facility] attention to [place] at [phone]. Will await for Louie Casa to call back tomorrow with INR results.

## 2019-06-09 NOTE — Telephone Encounter (Signed)
[place]  Patient Name: Nicolas [name] DOB: 30-Dec-1965 Appointment Date and Time: 06-15-2019 / 100pm With: Zella Ball first then Dr. Naaman Plummer   Transitional Care Questions   Questions for our staff to ask patients on Transitional care 48 hour phone call:   1. Are you/is patient experiencing any problems since coming home? Are there any questions regarding any aspect of care? NO NEW PROBLEMS  2. Are there any questions regarding medications administration/dosing? Are meds being taken as prescribed? Patient should review meds with caller to confirm NO PROBLEMS WITH MEDS  3. Have there been any falls? NO FALLS  4. Has Home Health been to the house and/or have they contacted you? If not, have you tried to contact them? Can we help you contact them? HOME HEALTH MADE CONTACT  5. Are bowels and bladder emptying properly? Are there any unexpected incontinence issues? If applicable, is patient following bowel/bladder programs? NO BLADDER OR BOWEL PROBLEMS  6. Any fevers, problems with breathing, unexpected pain? NO  7. Are there any skin problems or new areas of breakdown? NO  8. Has the patient/family member arranged specialty MD follow up (ie cardiology/neurology/renal/surgical/etc)? Can we help arrange? YES  9. Does the patient need any other services or support that we can help arrange? NO  10. Are caregivers following through as expected in assisting the patient? YES  11. Has the patient quit smoking, drinking alcohol, or using drugs as recommended? NA    [facility] [address] [phone]

## 2019-06-10 ENCOUNTER — Ambulatory Visit (INDEPENDENT_AMBULATORY_CARE_PROVIDER_SITE_OTHER): Payer: Medicare HMO | Admitting: Cardiovascular Disease

## 2019-06-10 DIAGNOSIS — Z7901 Long term (current) use of anticoagulants: Secondary | ICD-10-CM

## 2019-06-10 DIAGNOSIS — Z86711 Personal history of pulmonary embolism: Secondary | ICD-10-CM

## 2019-06-10 DIAGNOSIS — M1990 Unspecified osteoarthritis, unspecified site: Secondary | ICD-10-CM | POA: Diagnosis not present

## 2019-06-10 DIAGNOSIS — I10 Essential (primary) hypertension: Secondary | ICD-10-CM | POA: Diagnosis not present

## 2019-06-10 DIAGNOSIS — Z4789 Encounter for other orthopedic aftercare: Secondary | ICD-10-CM | POA: Diagnosis not present

## 2019-06-10 DIAGNOSIS — M4712 Other spondylosis with myelopathy, cervical region: Secondary | ICD-10-CM | POA: Diagnosis not present

## 2019-06-10 DIAGNOSIS — R600 Localized edema: Secondary | ICD-10-CM | POA: Diagnosis not present

## 2019-06-10 DIAGNOSIS — E1151 Type 2 diabetes mellitus with diabetic peripheral angiopathy without gangrene: Secondary | ICD-10-CM | POA: Diagnosis not present

## 2019-06-10 DIAGNOSIS — F329 Major depressive disorder, single episode, unspecified: Secondary | ICD-10-CM | POA: Diagnosis not present

## 2019-06-10 DIAGNOSIS — G4730 Sleep apnea, unspecified: Secondary | ICD-10-CM | POA: Diagnosis not present

## 2019-06-10 DIAGNOSIS — M48061 Spinal stenosis, lumbar region without neurogenic claudication: Secondary | ICD-10-CM | POA: Diagnosis not present

## 2019-06-10 DIAGNOSIS — I872 Venous insufficiency (chronic) (peripheral): Secondary | ICD-10-CM | POA: Diagnosis not present

## 2019-06-10 LAB — POCT INR: INR: 2.9 (ref 2.0–3.0)

## 2019-06-10 NOTE — Patient Instructions (Signed)
Description   Spoke with Nicolas [name] and pt and advised to continue taking the dose he has been taking which is 1 tablet daily except 1/2 tablet on Sunday, Tuesday, and Thursday. Recheck INR on Tuesday. Call us with any changes # [phone].

## 2019-06-12 ENCOUNTER — Telehealth: Payer: Self-pay | Admitting: Family Medicine

## 2019-06-12 DIAGNOSIS — Z4789 Encounter for other orthopedic aftercare: Secondary | ICD-10-CM | POA: Diagnosis not present

## 2019-06-12 DIAGNOSIS — M1990 Unspecified osteoarthritis, unspecified site: Secondary | ICD-10-CM | POA: Diagnosis not present

## 2019-06-12 DIAGNOSIS — E1151 Type 2 diabetes mellitus with diabetic peripheral angiopathy without gangrene: Secondary | ICD-10-CM | POA: Diagnosis not present

## 2019-06-12 DIAGNOSIS — M48061 Spinal stenosis, lumbar region without neurogenic claudication: Secondary | ICD-10-CM | POA: Diagnosis not present

## 2019-06-12 DIAGNOSIS — I872 Venous insufficiency (chronic) (peripheral): Secondary | ICD-10-CM | POA: Diagnosis not present

## 2019-06-12 DIAGNOSIS — I10 Essential (primary) hypertension: Secondary | ICD-10-CM | POA: Diagnosis not present

## 2019-06-12 DIAGNOSIS — F329 Major depressive disorder, single episode, unspecified: Secondary | ICD-10-CM | POA: Diagnosis not present

## 2019-06-12 DIAGNOSIS — G4730 Sleep apnea, unspecified: Secondary | ICD-10-CM | POA: Diagnosis not present

## 2019-06-12 DIAGNOSIS — M4712 Other spondylosis with myelopathy, cervical region: Secondary | ICD-10-CM | POA: Diagnosis not present

## 2019-06-12 NOTE — Telephone Encounter (Signed)
[place] calling with [facility] would like verbal for OT 1x4

## 2019-06-15 ENCOUNTER — Encounter: Payer: Self-pay | Admitting: Registered Nurse

## 2019-06-15 ENCOUNTER — Other Ambulatory Visit: Payer: Self-pay | Admitting: Cardiovascular Disease

## 2019-06-15 ENCOUNTER — Encounter: Payer: Medicare HMO | Attending: Registered Nurse | Admitting: Registered Nurse

## 2019-06-15 ENCOUNTER — Other Ambulatory Visit: Payer: Self-pay

## 2019-06-15 VITALS — BP 134/84 | HR 79 | Temp 97.7°F | Ht 70.0 in | Wt 305.0 lb

## 2019-06-15 DIAGNOSIS — E1169 Type 2 diabetes mellitus with other specified complication: Secondary | ICD-10-CM | POA: Diagnosis not present

## 2019-06-15 DIAGNOSIS — M4714 Other spondylosis with myelopathy, thoracic region: Secondary | ICD-10-CM | POA: Diagnosis not present

## 2019-06-15 DIAGNOSIS — Z86711 Personal history of pulmonary embolism: Secondary | ICD-10-CM | POA: Diagnosis not present

## 2019-06-15 DIAGNOSIS — E669 Obesity, unspecified: Secondary | ICD-10-CM | POA: Insufficient documentation

## 2019-06-15 DIAGNOSIS — I10 Essential (primary) hypertension: Secondary | ICD-10-CM | POA: Diagnosis not present

## 2019-06-15 NOTE — Progress Notes (Signed)
Subjective:    Patient ID: Nicolas [name], male    DOB: 1966-07-20, 53 y.o.   MRN: [id number]  HPI: Nicolas [name] is a 53 y.o. male who is here for hospital follow up  appointment for thoracic myelopathy, hypertension, history of pulmonary embolism and type 2 DM in obese. He presented to [facility] on 05/14/2019 with progressive lower extremity weakness R>L.  DG Thoracolumabar Spine:  FINDINGS: Three spot PA views of the thoracolumbar spine demonstrate surgical markers at the T5-6, T11-12, and L3-4 levels. Examination used for surgical localization purposes. CT Thoracic Spine:  IMPRESSION: 1. Postop laminectomy T4 and T5 with gas in the soft tissues as expected. No hematoma 2. Postop laminectomy T10-11 and T12-L1 with adequate decompression of the canal. No postoperative hematoma. 3. Severe diffuse facet degeneration throughout the thoracic spine with marked bony overgrowth of the facets well as diffuse disc disease causing severe foraminal encroachment at multiple levels throughout the thoracic spine CT Lumbar Spine:  IMPRESSION: 1. Lumbar laminectomy at L2-3 3 and L3-4 today. Gas in the soft tissues as expected. No fluid collection or hematoma. 2. Severe degenerative changes throughout the lumbar spine with prominent bony overgrowth of the facet joints diffusely causing extensive subarticular and foraminal stenosis throughout the lumbar spine. 3. Remote laminectomy L4-5.  He underwent: By Dr. Arnoldo Morale on 05/14/2019. LAMINECTOMY AND FORAMINOTOMY THORACIC FIVE- THORACIC SIX, THORACIC ELEVEN- THORACIC THORACIC, LUMBAR THREE- LUMBAR FOUR N/   Nicolas [name] was admitted to inpatient rehabilitation on 05/21/2019 and discharge home on 06/03/2019. He will be receiving outpatient therapy from [facility], this provider called [facility]. They will be calling Nicolas [name] today. He states he has pelvic pain . He rates his pain 4. His current exercise regime is walking with walker in  his home and performing stretching exercises.   Nicolas [name] arrived in motorized wheelchair.   Pain Inventory Average Pain 4 Pain Right Now 4 My pain is burning, stabbing and tingling  In the last 24 hours, has pain interfered with the following? General activity 4 Relation with others 0 Enjoyment of life 7 What TIME of day is your pain at its worst? evening Sleep (in general) Poor  Pain is worse with: other Pain improves with: other Relief from Meds: 0  Mobility use a walker ability to climb steps?  no do you drive?  no use a wheelchair transfers alone  Function disabled: date disabled 2012 I need assistance with the following:  shopping  Neuro/Psych weakness numbness tingling trouble walking spasms  Prior Studies Any changes since last visit?  no  Physicians involved in your care Any changes since last visit?  no   Family History  Problem Relation Age of Onset  . Cancer Mother 51       Lung  . Diabetes Father   . Prostate cancer Father   . Hypertension Father   . Hypertension Brother   . Hypertension Brother   . Diabetes Brother   . Heart attack Neg Hx   . Stroke Neg Hx   . Colon cancer Neg Hx   . Esophageal cancer Neg Hx   . Pancreatic cancer Neg Hx   . Rectal cancer Neg Hx   . Stomach cancer Neg Hx    Social History   Socioeconomic History  . Marital status: Single    Spouse name: Not on file  . Number of children: 3  . Years of education: 10  . Highest education level: 10th grade  Occupational History  . Occupation: Disability  Social Needs  . Financial resource strain: Not hard at all  . Food insecurity    Worry: Never true    Inability: Never true  . Transportation needs    Medical: No    Non-medical: No  Tobacco Use  . Smoking status: Former Smoker    Packs/day: 1.50    Years: 10.00    Pack years: 15.00    Types: Cigarettes    Quit date: 04/26/2001    Years since quitting: 18.1  . Smokeless tobacco: Never Used  Substance  and Sexual Activity  . Alcohol use: Yes    Alcohol/week: 1.0 standard drinks    Types: 1 Standard drinks or equivalent per week    Comment: very little  . Drug use: Yes    Types: Marijuana    Comment: daily  . Sexual activity: Yes  Lifestyle  . Physical activity    Days per week: 6 days    Minutes per session: 50 min  . Stress: Not at all  Relationships  . Social connections    Talks on phone: More than three times a week    Gets together: More than three times a week    Attends religious service: Never    Active member of club or organization: Yes    Attends meetings of clubs or organizations: More than 4 times per year    Relationship status: Never married  Other Topics Concern  . Not on file  Social History Narrative   Patient is single and lives at home, his daughter lives with him.   Patient has three children.   Patient is disabled.   Patient has a 10 grade education.   Patient is right-handed.   Patient drinks 1 or 2 sodas and tea per week.   Past Surgical History:  Procedure Laterality Date  . ANTERIOR CERVICAL CORPECTOMY N/A 01/26/2019   Procedure: Cervical Four Corpectomy with Cervical Three-Four, Cervical Four-Five interbody fusion, prosthesis, explore old fusion, possible removal of old plate;  Surgeon: [name], MD;  Location: [place];  Service: Neurosurgery;  Laterality: N/A;  anterior approach  . CHOLECYSTECTOMY N/A 05/24/2014   Procedure: LAPAROSCOPIC CHOLECYSTECTOMY;  Surgeon: Zenovia Jarred, MD;  Location: [place];  Service: General;  Laterality: N/A;  . ERCP N/A 05/25/2014   Procedure: ENDOSCOPIC RETROGRADE CHOLANGIOPANCREATOGRAPHY (ERCP);  Surgeon: Beryle Beams, MD;  Location: [place];  Service: Endoscopy;  Laterality: N/A;  . ERCP N/A 07/16/2014   Procedure: ENDOSCOPIC RETROGRADE CHOLANGIOPANCREATOGRAPHY (ERCP);  Surgeon: Beryle Beams, MD;  Location: [facility];  Service: Endoscopy;  Laterality: N/A;  . ivp filter  jan 2012  . LUMBAR  LAMINECTOMY/DECOMPRESSION MICRODISCECTOMY N/A 05/14/2019   Procedure: LAMINECTOMY AND FORAMINOTOMY THORACIC FIVE- THORACIC SIX, THORACIC ELEVEN- THORACIC THORACIC, LUMBAR THREE- LUMBAR FOUR;  Surgeon: [name], MD;  Location: [place];  Service: Neurosurgery;  Laterality: N/A;  posterior  . SPINE SURGERY  11/30/11   Total 6 back surgeries  . SPINE SURGERY  [id number]/5638   Dr. Cathren Laine in [place], [place]  . [place]     08/2003, cervial spine 2005, 2010 lower back, 2011 lower back, 2012 & 2013  . UPPER GASTROINTESTINAL ENDOSCOPY    . WISDOM TOOTH EXTRACTION     2 removed   Past Medical History:  Diagnosis Date  . Arthritis   . Clotting disorder ([place])    pulmonary embolus, DVT  . Depression   . Diabetes mellitus without complication ([place])  no meds, watching diet  . DVT (deep venous thrombosis) ([place])    Summer 2012  . Hypertension   . Neuromuscular disorder ([place])    spinal stenosis.- pt walks with a walker short distance  . Peripheral vascular disease ([place])   . PONV (postoperative nausea and vomiting)   . Pulmonary embolism ([place])    x 3 in 2005, 2008, 2010  . Sleep apnea    does not wear a c-pap  . Spinal stenosis    BP 134/84   Pulse 79   Temp 97.7 F (36.5 C)   Ht 5\' 10"  (1.778 m) Comment: reported  Wt (!) 305 lb (138.3 kg) Comment: reported  SpO2 98%   BMI 43.76 kg/m   Opioid Risk Score:   Fall Risk Score:  `1  Depression screen PHQ 2/9  Depression screen [facility] 2/9 06/15/2019 01/12/2019 10/31/2018 10/07/2017 04/04/2017 02/12/2017 12/21/2016  Decreased Interest 2 0 0 0 0 0 0  Down, Depressed, Hopeless 1 0 0 0 0 0 0  PHQ - 2 Score 3 0 0 0 0 0 0  Altered sleeping 3 - - - - - -  Tired, decreased energy 2 - - - - - -  Change in appetite 3 - - - - - -  Feeling bad or failure about yourself  0 - - - - - -  Trouble concentrating 0 - - - - - -  Moving slowly or fidgety/restless 0 - - - - - -  Suicidal thoughts 0 - - - - - -  PHQ-9 Score 11 - - - - - -  Difficult doing  work/chores Not difficult at all - - - - - -  Some recent data might be hidden    Review of Systems  Constitutional: Positive for appetite change.  HENT: Negative.   Eyes: Negative.   Respiratory: Negative.   Cardiovascular: Negative.   Gastrointestinal: Positive for constipation.  Endocrine: Negative.   Genitourinary: Negative.   Musculoskeletal: Positive for gait problem.       Spasms  Skin: Negative.   Allergic/Immunologic: Negative.   Neurological: Positive for weakness and numbness.       Tingling  Hematological: Bruises/bleeds easily.  Psychiatric/Behavioral: Negative.   All other systems reviewed and are negative.      Objective:   Physical Exam Constitutional:      Appearance: Normal appearance. He is obese.  Neck:     Musculoskeletal: Normal range of motion and neck supple.  Cardiovascular:     Rate and Rhythm: Normal rate and regular rhythm.     Pulses: Normal pulses.     Heart sounds: Normal heart sounds.  Pulmonary:     Effort: Pulmonary effort is normal.     Breath sounds: Normal breath sounds.  Musculoskeletal:     Comments: Normal Muscle Bulk and Muscle Testing Reveals:  Upper Extremities:Full  ROM and Muscle Strength 5/5  Lower Extremities: Decreased ROM and Muscle Strength 5/5 Arrived in wheelchair.   Skin:    General: Skin is warm and dry.  Neurological:     Mental Status: He is alert and oriented to person, place, and time.  Psychiatric:        Mood and Affect: Mood normal.        Behavior: Behavior normal.           Assessment & Plan:  1. Thoracic Myelopathy: Continue outpatient therapy with [facility]. Has a scheduled appointment with Dr. Arnoldo Morale. S/P T5-6 Laminectomy, T11-12 Laminectomy.  2. Essential Hypertension: Continue current medication regimen. PCP Following.  3. History of Pulmonary Embolism: Continue Coumadin. Cardiology Following.  4. Type 2 DM in Obese: Continue current medication regimen. PCP Following.   20 minutes  of face to face patient care time was spent during this visit. All questions were encouraged and answered.  F/U with Dr Letta Pate in 4- 6 weeks

## 2019-06-17 ENCOUNTER — Telehealth: Payer: Self-pay

## 2019-06-17 ENCOUNTER — Ambulatory Visit (INDEPENDENT_AMBULATORY_CARE_PROVIDER_SITE_OTHER): Payer: Medicare HMO | Admitting: Interventional Cardiology

## 2019-06-17 DIAGNOSIS — Z86711 Personal history of pulmonary embolism: Secondary | ICD-10-CM

## 2019-06-17 DIAGNOSIS — Z7901 Long term (current) use of anticoagulants: Secondary | ICD-10-CM | POA: Diagnosis not present

## 2019-06-17 DIAGNOSIS — F329 Major depressive disorder, single episode, unspecified: Secondary | ICD-10-CM | POA: Diagnosis not present

## 2019-06-17 DIAGNOSIS — Z4789 Encounter for other orthopedic aftercare: Secondary | ICD-10-CM | POA: Diagnosis not present

## 2019-06-17 DIAGNOSIS — M48061 Spinal stenosis, lumbar region without neurogenic claudication: Secondary | ICD-10-CM | POA: Diagnosis not present

## 2019-06-17 DIAGNOSIS — I872 Venous insufficiency (chronic) (peripheral): Secondary | ICD-10-CM | POA: Diagnosis not present

## 2019-06-17 DIAGNOSIS — R600 Localized edema: Secondary | ICD-10-CM | POA: Diagnosis not present

## 2019-06-17 DIAGNOSIS — M4712 Other spondylosis with myelopathy, cervical region: Secondary | ICD-10-CM | POA: Diagnosis not present

## 2019-06-17 DIAGNOSIS — G4730 Sleep apnea, unspecified: Secondary | ICD-10-CM | POA: Diagnosis not present

## 2019-06-17 DIAGNOSIS — E1151 Type 2 diabetes mellitus with diabetic peripheral angiopathy without gangrene: Secondary | ICD-10-CM | POA: Diagnosis not present

## 2019-06-17 DIAGNOSIS — M1990 Unspecified osteoarthritis, unspecified site: Secondary | ICD-10-CM | POA: Diagnosis not present

## 2019-06-17 DIAGNOSIS — I10 Essential (primary) hypertension: Secondary | ICD-10-CM | POA: Diagnosis not present

## 2019-06-17 LAB — POCT INR: INR: 4.5 — AB (ref 2.0–3.0)

## 2019-06-17 NOTE — Telephone Encounter (Signed)
Mallory, OT from [facility] called requesting orders for [facility] 1wk4. Orders approved and given. Tiffany, RN from [facility] called requesting verbal orders on OT and PT. Mallory states she could take verbal order for PT too, orders approved and given.

## 2019-06-18 DIAGNOSIS — M1990 Unspecified osteoarthritis, unspecified site: Secondary | ICD-10-CM | POA: Diagnosis not present

## 2019-06-18 DIAGNOSIS — Z4789 Encounter for other orthopedic aftercare: Secondary | ICD-10-CM | POA: Diagnosis not present

## 2019-06-18 DIAGNOSIS — G4730 Sleep apnea, unspecified: Secondary | ICD-10-CM | POA: Diagnosis not present

## 2019-06-18 DIAGNOSIS — E1151 Type 2 diabetes mellitus with diabetic peripheral angiopathy without gangrene: Secondary | ICD-10-CM | POA: Diagnosis not present

## 2019-06-18 DIAGNOSIS — M4712 Other spondylosis with myelopathy, cervical region: Secondary | ICD-10-CM | POA: Diagnosis not present

## 2019-06-18 DIAGNOSIS — I10 Essential (primary) hypertension: Secondary | ICD-10-CM | POA: Diagnosis not present

## 2019-06-18 DIAGNOSIS — M48061 Spinal stenosis, lumbar region without neurogenic claudication: Secondary | ICD-10-CM | POA: Diagnosis not present

## 2019-06-18 DIAGNOSIS — I872 Venous insufficiency (chronic) (peripheral): Secondary | ICD-10-CM | POA: Diagnosis not present

## 2019-06-18 DIAGNOSIS — F329 Major depressive disorder, single episode, unspecified: Secondary | ICD-10-CM | POA: Diagnosis not present

## 2019-06-19 DIAGNOSIS — I10 Essential (primary) hypertension: Secondary | ICD-10-CM | POA: Diagnosis not present

## 2019-06-19 DIAGNOSIS — M4712 Other spondylosis with myelopathy, cervical region: Secondary | ICD-10-CM | POA: Diagnosis not present

## 2019-06-19 DIAGNOSIS — M48061 Spinal stenosis, lumbar region without neurogenic claudication: Secondary | ICD-10-CM | POA: Diagnosis not present

## 2019-06-19 DIAGNOSIS — G4730 Sleep apnea, unspecified: Secondary | ICD-10-CM | POA: Diagnosis not present

## 2019-06-19 DIAGNOSIS — E1151 Type 2 diabetes mellitus with diabetic peripheral angiopathy without gangrene: Secondary | ICD-10-CM | POA: Diagnosis not present

## 2019-06-19 DIAGNOSIS — M1990 Unspecified osteoarthritis, unspecified site: Secondary | ICD-10-CM | POA: Diagnosis not present

## 2019-06-19 DIAGNOSIS — I872 Venous insufficiency (chronic) (peripheral): Secondary | ICD-10-CM | POA: Diagnosis not present

## 2019-06-19 DIAGNOSIS — F329 Major depressive disorder, single episode, unspecified: Secondary | ICD-10-CM | POA: Diagnosis not present

## 2019-06-19 DIAGNOSIS — Z4789 Encounter for other orthopedic aftercare: Secondary | ICD-10-CM | POA: Diagnosis not present

## 2019-06-19 NOTE — Telephone Encounter (Signed)
Spoke with Mallory at [facility]. She was informed she will probably need to reach out to Neurosurgery for this VO Dr Brigitte Pulse is no longer here and patient have not been seen since Dr Brigitte Pulse which is no longer here

## 2019-06-22 DIAGNOSIS — G4730 Sleep apnea, unspecified: Secondary | ICD-10-CM | POA: Diagnosis not present

## 2019-06-22 DIAGNOSIS — M4712 Other spondylosis with myelopathy, cervical region: Secondary | ICD-10-CM | POA: Diagnosis not present

## 2019-06-22 DIAGNOSIS — M1990 Unspecified osteoarthritis, unspecified site: Secondary | ICD-10-CM | POA: Diagnosis not present

## 2019-06-22 DIAGNOSIS — Z4789 Encounter for other orthopedic aftercare: Secondary | ICD-10-CM | POA: Diagnosis not present

## 2019-06-22 DIAGNOSIS — F329 Major depressive disorder, single episode, unspecified: Secondary | ICD-10-CM | POA: Diagnosis not present

## 2019-06-22 DIAGNOSIS — I872 Venous insufficiency (chronic) (peripheral): Secondary | ICD-10-CM | POA: Diagnosis not present

## 2019-06-22 DIAGNOSIS — I10 Essential (primary) hypertension: Secondary | ICD-10-CM | POA: Diagnosis not present

## 2019-06-22 DIAGNOSIS — E1151 Type 2 diabetes mellitus with diabetic peripheral angiopathy without gangrene: Secondary | ICD-10-CM | POA: Diagnosis not present

## 2019-06-22 DIAGNOSIS — M48061 Spinal stenosis, lumbar region without neurogenic claudication: Secondary | ICD-10-CM | POA: Diagnosis not present

## 2019-06-23 DIAGNOSIS — I872 Venous insufficiency (chronic) (peripheral): Secondary | ICD-10-CM | POA: Diagnosis not present

## 2019-06-23 DIAGNOSIS — E1151 Type 2 diabetes mellitus with diabetic peripheral angiopathy without gangrene: Secondary | ICD-10-CM | POA: Diagnosis not present

## 2019-06-23 DIAGNOSIS — M1990 Unspecified osteoarthritis, unspecified site: Secondary | ICD-10-CM | POA: Diagnosis not present

## 2019-06-23 DIAGNOSIS — F329 Major depressive disorder, single episode, unspecified: Secondary | ICD-10-CM | POA: Diagnosis not present

## 2019-06-23 DIAGNOSIS — M4712 Other spondylosis with myelopathy, cervical region: Secondary | ICD-10-CM | POA: Diagnosis not present

## 2019-06-23 DIAGNOSIS — M48061 Spinal stenosis, lumbar region without neurogenic claudication: Secondary | ICD-10-CM | POA: Diagnosis not present

## 2019-06-23 DIAGNOSIS — I10 Essential (primary) hypertension: Secondary | ICD-10-CM | POA: Diagnosis not present

## 2019-06-23 DIAGNOSIS — Z4789 Encounter for other orthopedic aftercare: Secondary | ICD-10-CM | POA: Diagnosis not present

## 2019-06-23 DIAGNOSIS — G4730 Sleep apnea, unspecified: Secondary | ICD-10-CM | POA: Diagnosis not present

## 2019-06-24 DIAGNOSIS — Z4789 Encounter for other orthopedic aftercare: Secondary | ICD-10-CM | POA: Diagnosis not present

## 2019-06-24 DIAGNOSIS — M4712 Other spondylosis with myelopathy, cervical region: Secondary | ICD-10-CM | POA: Diagnosis not present

## 2019-06-24 DIAGNOSIS — M48061 Spinal stenosis, lumbar region without neurogenic claudication: Secondary | ICD-10-CM | POA: Diagnosis not present

## 2019-06-24 DIAGNOSIS — F329 Major depressive disorder, single episode, unspecified: Secondary | ICD-10-CM | POA: Diagnosis not present

## 2019-06-24 DIAGNOSIS — M1990 Unspecified osteoarthritis, unspecified site: Secondary | ICD-10-CM | POA: Diagnosis not present

## 2019-06-24 DIAGNOSIS — E1151 Type 2 diabetes mellitus with diabetic peripheral angiopathy without gangrene: Secondary | ICD-10-CM | POA: Diagnosis not present

## 2019-06-24 DIAGNOSIS — I872 Venous insufficiency (chronic) (peripheral): Secondary | ICD-10-CM | POA: Diagnosis not present

## 2019-06-24 DIAGNOSIS — G4730 Sleep apnea, unspecified: Secondary | ICD-10-CM | POA: Diagnosis not present

## 2019-06-24 DIAGNOSIS — I10 Essential (primary) hypertension: Secondary | ICD-10-CM | POA: Diagnosis not present

## 2019-06-24 LAB — PROTIME-INR: INR: 1.9 — AB (ref 0.9–1.1)

## 2019-06-25 ENCOUNTER — Ambulatory Visit (INDEPENDENT_AMBULATORY_CARE_PROVIDER_SITE_OTHER): Payer: Medicare HMO | Admitting: Cardiology

## 2019-06-25 DIAGNOSIS — Z86711 Personal history of pulmonary embolism: Secondary | ICD-10-CM | POA: Diagnosis not present

## 2019-06-25 DIAGNOSIS — Z7901 Long term (current) use of anticoagulants: Secondary | ICD-10-CM | POA: Diagnosis not present

## 2019-06-25 DIAGNOSIS — R600 Localized edema: Secondary | ICD-10-CM

## 2019-06-25 NOTE — Patient Instructions (Signed)
Description   Spoke with Louie Casa RN and advised to take 1.5 tablets today (missed his dose yesterday), then continue taking 1 tablet daily except 1/2 tablet on Sunday, Tuesday, and Thursday. Recheck INR in 1 week. Call us with any changes # [phone].

## 2019-06-26 NOTE — Telephone Encounter (Signed)
Patient is managed in the coumadin clinic.

## 2019-06-29 DIAGNOSIS — G4730 Sleep apnea, unspecified: Secondary | ICD-10-CM | POA: Diagnosis not present

## 2019-06-29 DIAGNOSIS — M4712 Other spondylosis with myelopathy, cervical region: Secondary | ICD-10-CM | POA: Diagnosis not present

## 2019-06-29 DIAGNOSIS — Z4789 Encounter for other orthopedic aftercare: Secondary | ICD-10-CM | POA: Diagnosis not present

## 2019-06-29 DIAGNOSIS — M1990 Unspecified osteoarthritis, unspecified site: Secondary | ICD-10-CM | POA: Diagnosis not present

## 2019-06-29 DIAGNOSIS — I10 Essential (primary) hypertension: Secondary | ICD-10-CM | POA: Diagnosis not present

## 2019-06-29 DIAGNOSIS — I872 Venous insufficiency (chronic) (peripheral): Secondary | ICD-10-CM | POA: Diagnosis not present

## 2019-06-29 DIAGNOSIS — E1151 Type 2 diabetes mellitus with diabetic peripheral angiopathy without gangrene: Secondary | ICD-10-CM | POA: Diagnosis not present

## 2019-06-29 DIAGNOSIS — M48061 Spinal stenosis, lumbar region without neurogenic claudication: Secondary | ICD-10-CM | POA: Diagnosis not present

## 2019-06-29 DIAGNOSIS — F329 Major depressive disorder, single episode, unspecified: Secondary | ICD-10-CM | POA: Diagnosis not present

## 2019-07-01 ENCOUNTER — Ambulatory Visit (INDEPENDENT_AMBULATORY_CARE_PROVIDER_SITE_OTHER): Payer: Medicare HMO | Admitting: Internal Medicine

## 2019-07-01 DIAGNOSIS — Z7901 Long term (current) use of anticoagulants: Secondary | ICD-10-CM

## 2019-07-01 DIAGNOSIS — M48061 Spinal stenosis, lumbar region without neurogenic claudication: Secondary | ICD-10-CM | POA: Diagnosis not present

## 2019-07-01 DIAGNOSIS — F329 Major depressive disorder, single episode, unspecified: Secondary | ICD-10-CM | POA: Diagnosis not present

## 2019-07-01 DIAGNOSIS — M4712 Other spondylosis with myelopathy, cervical region: Secondary | ICD-10-CM | POA: Diagnosis not present

## 2019-07-01 DIAGNOSIS — M1990 Unspecified osteoarthritis, unspecified site: Secondary | ICD-10-CM | POA: Diagnosis not present

## 2019-07-01 DIAGNOSIS — E1151 Type 2 diabetes mellitus with diabetic peripheral angiopathy without gangrene: Secondary | ICD-10-CM | POA: Diagnosis not present

## 2019-07-01 DIAGNOSIS — G4730 Sleep apnea, unspecified: Secondary | ICD-10-CM | POA: Diagnosis not present

## 2019-07-01 DIAGNOSIS — I10 Essential (primary) hypertension: Secondary | ICD-10-CM | POA: Diagnosis not present

## 2019-07-01 DIAGNOSIS — Z86711 Personal history of pulmonary embolism: Secondary | ICD-10-CM

## 2019-07-01 DIAGNOSIS — R600 Localized edema: Secondary | ICD-10-CM

## 2019-07-01 DIAGNOSIS — Z4789 Encounter for other orthopedic aftercare: Secondary | ICD-10-CM | POA: Diagnosis not present

## 2019-07-01 DIAGNOSIS — I872 Venous insufficiency (chronic) (peripheral): Secondary | ICD-10-CM | POA: Diagnosis not present

## 2019-07-01 LAB — POCT INR: INR: 3.7 — AB (ref 2.0–3.0)

## 2019-07-01 NOTE — Patient Instructions (Signed)
Description   Spoke with Louie Casa RN and advised to hold today's dose then continue taking 1 tablet daily except 1/2 tablet on Sunday, Tuesday, and Thursday. Recheck INR in 1 week. Call us with any changes # [phone].

## 2019-07-02 ENCOUNTER — Telehealth: Payer: Self-pay

## 2019-07-02 DIAGNOSIS — F329 Major depressive disorder, single episode, unspecified: Secondary | ICD-10-CM | POA: Diagnosis not present

## 2019-07-02 DIAGNOSIS — E1151 Type 2 diabetes mellitus with diabetic peripheral angiopathy without gangrene: Secondary | ICD-10-CM | POA: Diagnosis not present

## 2019-07-02 DIAGNOSIS — I10 Essential (primary) hypertension: Secondary | ICD-10-CM | POA: Diagnosis not present

## 2019-07-02 DIAGNOSIS — Z4789 Encounter for other orthopedic aftercare: Secondary | ICD-10-CM | POA: Diagnosis not present

## 2019-07-02 DIAGNOSIS — M4712 Other spondylosis with myelopathy, cervical region: Secondary | ICD-10-CM | POA: Diagnosis not present

## 2019-07-02 DIAGNOSIS — M48061 Spinal stenosis, lumbar region without neurogenic claudication: Secondary | ICD-10-CM | POA: Diagnosis not present

## 2019-07-02 DIAGNOSIS — M1990 Unspecified osteoarthritis, unspecified site: Secondary | ICD-10-CM | POA: Diagnosis not present

## 2019-07-02 DIAGNOSIS — I872 Venous insufficiency (chronic) (peripheral): Secondary | ICD-10-CM | POA: Diagnosis not present

## 2019-07-02 DIAGNOSIS — G4730 Sleep apnea, unspecified: Secondary | ICD-10-CM | POA: Diagnosis not present

## 2019-07-02 NOTE — Telephone Encounter (Signed)
Sharyn Lull called requesting an increase on the frequency for HHPT 2wk4, 2wk1. Orders approved and given.

## 2019-07-03 ENCOUNTER — Ambulatory Visit: Payer: Self-pay

## 2019-07-03 NOTE — Telephone Encounter (Signed)
rec'd call from OT with [facility].  Reported pt. Has high BP; BP 186/110 @ 12:40 PM, and 190/110 @ 12:45 PM.  Per the Occupational Therapist, the pt. Denied any headache, dizziness, blurred vision, weakness in extremities, or chest pain/ shortness of breath.  Reported his previous BP readings have been 120's-130's/ 60's.  Per OT, his blood pressure readings have been trending-up, over the past 5 days; BP 140/68 on 8/3; BP 170/84 on 8/5; BP 140/82 on 8/6.  Taking his prescribed medication as ordered.  Call placed to Scheduler at PCP.  No available openings today.  Advised OT that pt. should go to UC today, for evaluation.  Per OT, pt. In agreement to go to UC today, but would like to schedule an office appt. Next week.  Appt. For  F/u  With PCP office on 07/07/19 @ 11:30 AM.  Pt. Agreed with plan.   Reason for Disposition . Systolic BP  >= 546 OR Diastolic >= 270  Answer Assessment - Initial Assessment Questions 1. BLOOD PRESSURE: "What is the blood pressure?" "Did you take at least two measurements 5 minutes apart?"     186/110 @ 12:40 PM, and 190/110 @ 12:45 PM 2. ONSET: "When did you take your blood pressure?"     See above 3. HOW: "How did you obtain the blood pressure?" (e.g., visiting nurse, automatic home BP monitor)     Automatic BP cuff 4. HISTORY: "Do you have a history of high blood pressure?"    yes 5. MEDICATIONS: "Are you taking any medications for blood pressure?" "Have you missed any doses recently?"    On Metoprolol and Furosemide ; has not missed any doses  6. OTHER SYMPTOMS: "Do you have any symptoms?" (e.g., headache, chest pain, blurred vision, difficulty breathing, weakness)     Denied headache, blurred vision, weakness of extremities; no chest pain or shortness of breath  7. PREGNANCY: "Is there any chance you are pregnant?" "When was your last menstrual period?"     N/a  Protocols used: HIGH BLOOD PRESSURE-A-AH

## 2019-07-06 ENCOUNTER — Other Ambulatory Visit: Payer: Self-pay | Admitting: Cardiovascular Disease

## 2019-07-06 DIAGNOSIS — I10 Essential (primary) hypertension: Secondary | ICD-10-CM | POA: Diagnosis not present

## 2019-07-06 DIAGNOSIS — Z4789 Encounter for other orthopedic aftercare: Secondary | ICD-10-CM | POA: Diagnosis not present

## 2019-07-06 DIAGNOSIS — M1990 Unspecified osteoarthritis, unspecified site: Secondary | ICD-10-CM | POA: Diagnosis not present

## 2019-07-06 DIAGNOSIS — M4712 Other spondylosis with myelopathy, cervical region: Secondary | ICD-10-CM | POA: Diagnosis not present

## 2019-07-06 DIAGNOSIS — G4730 Sleep apnea, unspecified: Secondary | ICD-10-CM | POA: Diagnosis not present

## 2019-07-06 DIAGNOSIS — E1151 Type 2 diabetes mellitus with diabetic peripheral angiopathy without gangrene: Secondary | ICD-10-CM | POA: Diagnosis not present

## 2019-07-06 DIAGNOSIS — M48061 Spinal stenosis, lumbar region without neurogenic claudication: Secondary | ICD-10-CM | POA: Diagnosis not present

## 2019-07-06 DIAGNOSIS — I872 Venous insufficiency (chronic) (peripheral): Secondary | ICD-10-CM | POA: Diagnosis not present

## 2019-07-06 DIAGNOSIS — F329 Major depressive disorder, single episode, unspecified: Secondary | ICD-10-CM | POA: Diagnosis not present

## 2019-07-07 ENCOUNTER — Telehealth: Payer: Self-pay | Admitting: Family Medicine

## 2019-07-07 ENCOUNTER — Ambulatory Visit: Payer: Medicare HMO | Admitting: Registered Nurse

## 2019-07-07 NOTE — Telephone Encounter (Signed)
Home Health Verbal Orders - Caller/Agency: [facility]  Callback Number: [phone] Requesting OT/PT/Skilled Nursing/Social Work/Speech Therapy: OT Frequency:  Extension order  1 w 4

## 2019-07-08 ENCOUNTER — Ambulatory Visit (INDEPENDENT_AMBULATORY_CARE_PROVIDER_SITE_OTHER): Payer: Medicare HMO

## 2019-07-08 DIAGNOSIS — M4712 Other spondylosis with myelopathy, cervical region: Secondary | ICD-10-CM | POA: Diagnosis not present

## 2019-07-08 DIAGNOSIS — Z86711 Personal history of pulmonary embolism: Secondary | ICD-10-CM | POA: Diagnosis not present

## 2019-07-08 DIAGNOSIS — Z4789 Encounter for other orthopedic aftercare: Secondary | ICD-10-CM | POA: Diagnosis not present

## 2019-07-08 DIAGNOSIS — I872 Venous insufficiency (chronic) (peripheral): Secondary | ICD-10-CM | POA: Diagnosis not present

## 2019-07-08 DIAGNOSIS — Z6841 Body Mass Index (BMI) 40.0 and over, adult: Secondary | ICD-10-CM

## 2019-07-08 DIAGNOSIS — Z7901 Long term (current) use of anticoagulants: Secondary | ICD-10-CM | POA: Diagnosis not present

## 2019-07-08 DIAGNOSIS — I10 Essential (primary) hypertension: Secondary | ICD-10-CM

## 2019-07-08 DIAGNOSIS — G4730 Sleep apnea, unspecified: Secondary | ICD-10-CM | POA: Diagnosis not present

## 2019-07-08 DIAGNOSIS — E1151 Type 2 diabetes mellitus with diabetic peripheral angiopathy without gangrene: Secondary | ICD-10-CM | POA: Diagnosis not present

## 2019-07-08 DIAGNOSIS — F329 Major depressive disorder, single episode, unspecified: Secondary | ICD-10-CM | POA: Diagnosis not present

## 2019-07-08 DIAGNOSIS — M48061 Spinal stenosis, lumbar region without neurogenic claudication: Secondary | ICD-10-CM | POA: Diagnosis not present

## 2019-07-08 DIAGNOSIS — Z86718 Personal history of other venous thrombosis and embolism: Secondary | ICD-10-CM

## 2019-07-08 DIAGNOSIS — M1990 Unspecified osteoarthritis, unspecified site: Secondary | ICD-10-CM | POA: Diagnosis not present

## 2019-07-08 DIAGNOSIS — Z9181 History of falling: Secondary | ICD-10-CM

## 2019-07-08 DIAGNOSIS — R600 Localized edema: Secondary | ICD-10-CM | POA: Diagnosis not present

## 2019-07-08 LAB — POCT INR: INR: 2.2 (ref 2.0–3.0)

## 2019-07-08 NOTE — Telephone Encounter (Signed)
Mallory calling to check on extension. Requesting a call back today.  Please advise.   [phone]

## 2019-07-08 NOTE — Patient Instructions (Signed)
Description   Spoke with Louie Casa RN and advised to have pt continue on same dosage 1 tablet daily except 1/2 tablet on Sundays, Tuesdays, and Thursdays. Recheck INR in 1 week. Call us with any changes # [phone].

## 2019-07-09 NOTE — Telephone Encounter (Signed)
Spoke with Nicolas [name] an advised per 06/12/2019 telephone note she would need to reach out to Neurosurgery for extension as pt pcp is no longer here and he has not established care with any other provider in our office. Dgaddy, CMA

## 2019-07-13 ENCOUNTER — Telehealth: Payer: Self-pay

## 2019-07-13 ENCOUNTER — Telehealth: Payer: Self-pay | Admitting: Cardiovascular Disease

## 2019-07-13 DIAGNOSIS — M48061 Spinal stenosis, lumbar region without neurogenic claudication: Secondary | ICD-10-CM | POA: Diagnosis not present

## 2019-07-13 DIAGNOSIS — M4712 Other spondylosis with myelopathy, cervical region: Secondary | ICD-10-CM | POA: Diagnosis not present

## 2019-07-13 DIAGNOSIS — F329 Major depressive disorder, single episode, unspecified: Secondary | ICD-10-CM | POA: Diagnosis not present

## 2019-07-13 DIAGNOSIS — I872 Venous insufficiency (chronic) (peripheral): Secondary | ICD-10-CM | POA: Diagnosis not present

## 2019-07-13 DIAGNOSIS — G4730 Sleep apnea, unspecified: Secondary | ICD-10-CM | POA: Diagnosis not present

## 2019-07-13 DIAGNOSIS — E1151 Type 2 diabetes mellitus with diabetic peripheral angiopathy without gangrene: Secondary | ICD-10-CM | POA: Diagnosis not present

## 2019-07-13 DIAGNOSIS — M1990 Unspecified osteoarthritis, unspecified site: Secondary | ICD-10-CM | POA: Diagnosis not present

## 2019-07-13 DIAGNOSIS — Z4789 Encounter for other orthopedic aftercare: Secondary | ICD-10-CM | POA: Diagnosis not present

## 2019-07-13 DIAGNOSIS — I10 Essential (primary) hypertension: Secondary | ICD-10-CM | POA: Diagnosis not present

## 2019-07-13 NOTE — Telephone Encounter (Signed)
New Message    Mallory from [facility] calling for verbal occupational therapy orders as follows:  Once a week for three weeks.  Please call her back to advise.

## 2019-07-13 NOTE — Telephone Encounter (Signed)
Nicolas [name] at [facility] called and left a message on the triage line and stated that she needs a verbal order for the extension of OT 1 time a week for 3 weeks on the patient. Please advise

## 2019-07-13 NOTE — Telephone Encounter (Signed)
Pt is followed by [facility]. OT is for his back issues.  I spoke with [facility] and she will contact that office for reorder.

## 2019-07-13 NOTE — Telephone Encounter (Signed)
Verbal OK given per protocol  

## 2019-07-15 DIAGNOSIS — M4712 Other spondylosis with myelopathy, cervical region: Secondary | ICD-10-CM | POA: Diagnosis not present

## 2019-07-15 DIAGNOSIS — E1151 Type 2 diabetes mellitus with diabetic peripheral angiopathy without gangrene: Secondary | ICD-10-CM | POA: Diagnosis not present

## 2019-07-15 DIAGNOSIS — I872 Venous insufficiency (chronic) (peripheral): Secondary | ICD-10-CM | POA: Diagnosis not present

## 2019-07-15 DIAGNOSIS — F329 Major depressive disorder, single episode, unspecified: Secondary | ICD-10-CM | POA: Diagnosis not present

## 2019-07-15 DIAGNOSIS — Z4789 Encounter for other orthopedic aftercare: Secondary | ICD-10-CM | POA: Diagnosis not present

## 2019-07-15 DIAGNOSIS — G4730 Sleep apnea, unspecified: Secondary | ICD-10-CM | POA: Diagnosis not present

## 2019-07-15 DIAGNOSIS — M1990 Unspecified osteoarthritis, unspecified site: Secondary | ICD-10-CM | POA: Diagnosis not present

## 2019-07-15 DIAGNOSIS — I10 Essential (primary) hypertension: Secondary | ICD-10-CM | POA: Diagnosis not present

## 2019-07-15 DIAGNOSIS — M48061 Spinal stenosis, lumbar region without neurogenic claudication: Secondary | ICD-10-CM | POA: Diagnosis not present

## 2019-07-15 DIAGNOSIS — Z7901 Long term (current) use of anticoagulants: Secondary | ICD-10-CM

## 2019-07-15 DIAGNOSIS — Z86718 Personal history of other venous thrombosis and embolism: Secondary | ICD-10-CM

## 2019-07-15 DIAGNOSIS — Z86711 Personal history of pulmonary embolism: Secondary | ICD-10-CM

## 2019-07-15 DIAGNOSIS — Z6841 Body Mass Index (BMI) 40.0 and over, adult: Secondary | ICD-10-CM

## 2019-07-15 DIAGNOSIS — Z9181 History of falling: Secondary | ICD-10-CM

## 2019-07-15 LAB — POCT INR: INR: 2.4 (ref 2.0–3.0)

## 2019-07-17 DIAGNOSIS — E1151 Type 2 diabetes mellitus with diabetic peripheral angiopathy without gangrene: Secondary | ICD-10-CM | POA: Diagnosis not present

## 2019-07-17 DIAGNOSIS — G4730 Sleep apnea, unspecified: Secondary | ICD-10-CM | POA: Diagnosis not present

## 2019-07-17 DIAGNOSIS — M1990 Unspecified osteoarthritis, unspecified site: Secondary | ICD-10-CM | POA: Diagnosis not present

## 2019-07-17 DIAGNOSIS — I10 Essential (primary) hypertension: Secondary | ICD-10-CM | POA: Diagnosis not present

## 2019-07-17 DIAGNOSIS — F329 Major depressive disorder, single episode, unspecified: Secondary | ICD-10-CM | POA: Diagnosis not present

## 2019-07-17 DIAGNOSIS — Z4789 Encounter for other orthopedic aftercare: Secondary | ICD-10-CM | POA: Diagnosis not present

## 2019-07-17 DIAGNOSIS — M4712 Other spondylosis with myelopathy, cervical region: Secondary | ICD-10-CM | POA: Diagnosis not present

## 2019-07-17 DIAGNOSIS — I872 Venous insufficiency (chronic) (peripheral): Secondary | ICD-10-CM | POA: Diagnosis not present

## 2019-07-17 DIAGNOSIS — M48061 Spinal stenosis, lumbar region without neurogenic claudication: Secondary | ICD-10-CM | POA: Diagnosis not present

## 2019-07-20 ENCOUNTER — Ambulatory Visit (INDEPENDENT_AMBULATORY_CARE_PROVIDER_SITE_OTHER): Payer: Medicare HMO | Admitting: Internal Medicine

## 2019-07-20 DIAGNOSIS — Z7901 Long term (current) use of anticoagulants: Secondary | ICD-10-CM | POA: Diagnosis not present

## 2019-07-20 DIAGNOSIS — R600 Localized edema: Secondary | ICD-10-CM

## 2019-07-20 DIAGNOSIS — Z86711 Personal history of pulmonary embolism: Secondary | ICD-10-CM | POA: Diagnosis not present

## 2019-07-20 NOTE — Patient Instructions (Signed)
Description   Spoke with Louie Casa RN and advised to have pt to continue taking the dose he has been taking which is 1 tablet daily except 1/2 tablet on Sundays, Tuesdays, and Thursdays. Recheck INR in 10 days. Call us with any changes # [phone].

## 2019-07-22 ENCOUNTER — Other Ambulatory Visit: Payer: Self-pay | Admitting: Cardiovascular Disease

## 2019-07-22 DIAGNOSIS — E1151 Type 2 diabetes mellitus with diabetic peripheral angiopathy without gangrene: Secondary | ICD-10-CM | POA: Diagnosis not present

## 2019-07-22 DIAGNOSIS — G4730 Sleep apnea, unspecified: Secondary | ICD-10-CM | POA: Diagnosis not present

## 2019-07-22 DIAGNOSIS — I872 Venous insufficiency (chronic) (peripheral): Secondary | ICD-10-CM | POA: Diagnosis not present

## 2019-07-22 DIAGNOSIS — F329 Major depressive disorder, single episode, unspecified: Secondary | ICD-10-CM | POA: Diagnosis not present

## 2019-07-22 DIAGNOSIS — I10 Essential (primary) hypertension: Secondary | ICD-10-CM | POA: Diagnosis not present

## 2019-07-22 DIAGNOSIS — M4712 Other spondylosis with myelopathy, cervical region: Secondary | ICD-10-CM | POA: Diagnosis not present

## 2019-07-22 DIAGNOSIS — Z4789 Encounter for other orthopedic aftercare: Secondary | ICD-10-CM | POA: Diagnosis not present

## 2019-07-22 DIAGNOSIS — M48061 Spinal stenosis, lumbar region without neurogenic claudication: Secondary | ICD-10-CM | POA: Diagnosis not present

## 2019-07-22 DIAGNOSIS — M1990 Unspecified osteoarthritis, unspecified site: Secondary | ICD-10-CM | POA: Diagnosis not present

## 2019-07-23 ENCOUNTER — Ambulatory Visit: Payer: Medicare HMO | Admitting: Physical Medicine & Rehabilitation

## 2019-07-23 ENCOUNTER — Encounter: Payer: Medicare HMO | Admitting: Physical Medicine & Rehabilitation

## 2019-07-24 DIAGNOSIS — F329 Major depressive disorder, single episode, unspecified: Secondary | ICD-10-CM | POA: Diagnosis not present

## 2019-07-24 DIAGNOSIS — Z4789 Encounter for other orthopedic aftercare: Secondary | ICD-10-CM | POA: Diagnosis not present

## 2019-07-24 DIAGNOSIS — I10 Essential (primary) hypertension: Secondary | ICD-10-CM | POA: Diagnosis not present

## 2019-07-24 DIAGNOSIS — M1990 Unspecified osteoarthritis, unspecified site: Secondary | ICD-10-CM | POA: Diagnosis not present

## 2019-07-24 DIAGNOSIS — M48061 Spinal stenosis, lumbar region without neurogenic claudication: Secondary | ICD-10-CM | POA: Diagnosis not present

## 2019-07-24 DIAGNOSIS — M4712 Other spondylosis with myelopathy, cervical region: Secondary | ICD-10-CM | POA: Diagnosis not present

## 2019-07-24 DIAGNOSIS — I872 Venous insufficiency (chronic) (peripheral): Secondary | ICD-10-CM | POA: Diagnosis not present

## 2019-07-24 DIAGNOSIS — E1151 Type 2 diabetes mellitus with diabetic peripheral angiopathy without gangrene: Secondary | ICD-10-CM | POA: Diagnosis not present

## 2019-07-24 DIAGNOSIS — G4730 Sleep apnea, unspecified: Secondary | ICD-10-CM | POA: Diagnosis not present

## 2019-07-27 DIAGNOSIS — I10 Essential (primary) hypertension: Secondary | ICD-10-CM | POA: Diagnosis not present

## 2019-07-27 DIAGNOSIS — M1990 Unspecified osteoarthritis, unspecified site: Secondary | ICD-10-CM | POA: Diagnosis not present

## 2019-07-27 DIAGNOSIS — F329 Major depressive disorder, single episode, unspecified: Secondary | ICD-10-CM | POA: Diagnosis not present

## 2019-07-27 DIAGNOSIS — I872 Venous insufficiency (chronic) (peripheral): Secondary | ICD-10-CM | POA: Diagnosis not present

## 2019-07-27 DIAGNOSIS — M48061 Spinal stenosis, lumbar region without neurogenic claudication: Secondary | ICD-10-CM | POA: Diagnosis not present

## 2019-07-27 DIAGNOSIS — Z4789 Encounter for other orthopedic aftercare: Secondary | ICD-10-CM | POA: Diagnosis not present

## 2019-07-27 DIAGNOSIS — M4712 Other spondylosis with myelopathy, cervical region: Secondary | ICD-10-CM | POA: Diagnosis not present

## 2019-07-27 DIAGNOSIS — E1151 Type 2 diabetes mellitus with diabetic peripheral angiopathy without gangrene: Secondary | ICD-10-CM | POA: Diagnosis not present

## 2019-07-27 DIAGNOSIS — G4730 Sleep apnea, unspecified: Secondary | ICD-10-CM | POA: Diagnosis not present

## 2019-07-28 DIAGNOSIS — M1990 Unspecified osteoarthritis, unspecified site: Secondary | ICD-10-CM | POA: Diagnosis not present

## 2019-07-28 DIAGNOSIS — Z4789 Encounter for other orthopedic aftercare: Secondary | ICD-10-CM | POA: Diagnosis not present

## 2019-07-28 DIAGNOSIS — M4712 Other spondylosis with myelopathy, cervical region: Secondary | ICD-10-CM | POA: Diagnosis not present

## 2019-07-28 DIAGNOSIS — M48061 Spinal stenosis, lumbar region without neurogenic claudication: Secondary | ICD-10-CM | POA: Diagnosis not present

## 2019-07-28 DIAGNOSIS — E1151 Type 2 diabetes mellitus with diabetic peripheral angiopathy without gangrene: Secondary | ICD-10-CM | POA: Diagnosis not present

## 2019-07-28 DIAGNOSIS — G4730 Sleep apnea, unspecified: Secondary | ICD-10-CM | POA: Diagnosis not present

## 2019-07-28 DIAGNOSIS — I872 Venous insufficiency (chronic) (peripheral): Secondary | ICD-10-CM | POA: Diagnosis not present

## 2019-07-28 DIAGNOSIS — F329 Major depressive disorder, single episode, unspecified: Secondary | ICD-10-CM | POA: Diagnosis not present

## 2019-07-28 DIAGNOSIS — I10 Essential (primary) hypertension: Secondary | ICD-10-CM | POA: Diagnosis not present

## 2019-07-31 ENCOUNTER — Telehealth: Payer: Self-pay | Admitting: *Deleted

## 2019-07-31 DIAGNOSIS — I10 Essential (primary) hypertension: Secondary | ICD-10-CM | POA: Diagnosis not present

## 2019-07-31 DIAGNOSIS — I872 Venous insufficiency (chronic) (peripheral): Secondary | ICD-10-CM | POA: Diagnosis not present

## 2019-07-31 DIAGNOSIS — Z4789 Encounter for other orthopedic aftercare: Secondary | ICD-10-CM | POA: Diagnosis not present

## 2019-07-31 DIAGNOSIS — F329 Major depressive disorder, single episode, unspecified: Secondary | ICD-10-CM | POA: Diagnosis not present

## 2019-07-31 DIAGNOSIS — E1151 Type 2 diabetes mellitus with diabetic peripheral angiopathy without gangrene: Secondary | ICD-10-CM | POA: Diagnosis not present

## 2019-07-31 DIAGNOSIS — G4730 Sleep apnea, unspecified: Secondary | ICD-10-CM | POA: Diagnosis not present

## 2019-07-31 DIAGNOSIS — M48061 Spinal stenosis, lumbar region without neurogenic claudication: Secondary | ICD-10-CM | POA: Diagnosis not present

## 2019-07-31 DIAGNOSIS — M4712 Other spondylosis with myelopathy, cervical region: Secondary | ICD-10-CM | POA: Diagnosis not present

## 2019-07-31 DIAGNOSIS — M1990 Unspecified osteoarthritis, unspecified site: Secondary | ICD-10-CM | POA: Diagnosis not present

## 2019-07-31 NOTE — Telephone Encounter (Signed)
Sharyn Lull from [facility] called to get extension for 2wk2.  He has made a lot of improvement but not quite ready for outpt therapy.  Approval given but told Sharyn Lull to remind him he needs a follow up in our office.

## 2019-08-10 DIAGNOSIS — M47813 Spondylosis without myelopathy or radiculopathy, cervicothoracic region: Secondary | ICD-10-CM | POA: Diagnosis not present

## 2019-08-10 DIAGNOSIS — I872 Venous insufficiency (chronic) (peripheral): Secondary | ICD-10-CM | POA: Diagnosis not present

## 2019-08-10 DIAGNOSIS — M9972 Connective tissue and disc stenosis of intervertebral foramina of thoracic region: Secondary | ICD-10-CM | POA: Diagnosis not present

## 2019-08-10 DIAGNOSIS — M1990 Unspecified osteoarthritis, unspecified site: Secondary | ICD-10-CM | POA: Diagnosis not present

## 2019-08-10 DIAGNOSIS — M47814 Spondylosis without myelopathy or radiculopathy, thoracic region: Secondary | ICD-10-CM | POA: Diagnosis not present

## 2019-08-10 DIAGNOSIS — I10 Essential (primary) hypertension: Secondary | ICD-10-CM | POA: Diagnosis not present

## 2019-08-10 DIAGNOSIS — M4805 Spinal stenosis, thoracolumbar region: Secondary | ICD-10-CM | POA: Diagnosis not present

## 2019-08-10 DIAGNOSIS — G4730 Sleep apnea, unspecified: Secondary | ICD-10-CM | POA: Diagnosis not present

## 2019-08-10 DIAGNOSIS — E1151 Type 2 diabetes mellitus with diabetic peripheral angiopathy without gangrene: Secondary | ICD-10-CM | POA: Diagnosis not present

## 2019-08-11 ENCOUNTER — Telehealth: Payer: Self-pay

## 2019-08-11 NOTE — Telephone Encounter (Signed)
Sharyn Lull, PT/[facility] called requesting a change in frequency per patients request. HHPT 1wk7. Order approved and given and also a reminder that he needs to make an appt.

## 2019-08-17 DIAGNOSIS — Z7901 Long term (current) use of anticoagulants: Secondary | ICD-10-CM

## 2019-08-17 DIAGNOSIS — E1151 Type 2 diabetes mellitus with diabetic peripheral angiopathy without gangrene: Secondary | ICD-10-CM | POA: Diagnosis not present

## 2019-08-17 DIAGNOSIS — Z9181 History of falling: Secondary | ICD-10-CM

## 2019-08-17 DIAGNOSIS — M1990 Unspecified osteoarthritis, unspecified site: Secondary | ICD-10-CM | POA: Diagnosis not present

## 2019-08-17 DIAGNOSIS — G4730 Sleep apnea, unspecified: Secondary | ICD-10-CM | POA: Diagnosis not present

## 2019-08-17 DIAGNOSIS — M9972 Connective tissue and disc stenosis of intervertebral foramina of thoracic region: Secondary | ICD-10-CM | POA: Diagnosis not present

## 2019-08-17 DIAGNOSIS — M47814 Spondylosis without myelopathy or radiculopathy, thoracic region: Secondary | ICD-10-CM | POA: Diagnosis not present

## 2019-08-17 DIAGNOSIS — Z86711 Personal history of pulmonary embolism: Secondary | ICD-10-CM

## 2019-08-17 DIAGNOSIS — I10 Essential (primary) hypertension: Secondary | ICD-10-CM | POA: Diagnosis not present

## 2019-08-17 DIAGNOSIS — E1169 Type 2 diabetes mellitus with other specified complication: Secondary | ICD-10-CM

## 2019-08-17 DIAGNOSIS — M4805 Spinal stenosis, thoracolumbar region: Secondary | ICD-10-CM | POA: Diagnosis not present

## 2019-08-17 DIAGNOSIS — M47813 Spondylosis without myelopathy or radiculopathy, cervicothoracic region: Secondary | ICD-10-CM | POA: Diagnosis not present

## 2019-08-17 DIAGNOSIS — Z86718 Personal history of other venous thrombosis and embolism: Secondary | ICD-10-CM

## 2019-08-17 DIAGNOSIS — F329 Major depressive disorder, single episode, unspecified: Secondary | ICD-10-CM

## 2019-08-17 DIAGNOSIS — Z6841 Body Mass Index (BMI) 40.0 and over, adult: Secondary | ICD-10-CM

## 2019-08-17 DIAGNOSIS — I872 Venous insufficiency (chronic) (peripheral): Secondary | ICD-10-CM | POA: Diagnosis not present

## 2019-08-19 DIAGNOSIS — M4805 Spinal stenosis, thoracolumbar region: Secondary | ICD-10-CM | POA: Diagnosis not present

## 2019-08-19 DIAGNOSIS — I872 Venous insufficiency (chronic) (peripheral): Secondary | ICD-10-CM | POA: Diagnosis not present

## 2019-08-19 DIAGNOSIS — E1151 Type 2 diabetes mellitus with diabetic peripheral angiopathy without gangrene: Secondary | ICD-10-CM | POA: Diagnosis not present

## 2019-08-19 DIAGNOSIS — M47814 Spondylosis without myelopathy or radiculopathy, thoracic region: Secondary | ICD-10-CM | POA: Diagnosis not present

## 2019-08-19 DIAGNOSIS — I10 Essential (primary) hypertension: Secondary | ICD-10-CM | POA: Diagnosis not present

## 2019-08-19 DIAGNOSIS — G4730 Sleep apnea, unspecified: Secondary | ICD-10-CM | POA: Diagnosis not present

## 2019-08-19 DIAGNOSIS — M47813 Spondylosis without myelopathy or radiculopathy, cervicothoracic region: Secondary | ICD-10-CM | POA: Diagnosis not present

## 2019-08-19 DIAGNOSIS — M9972 Connective tissue and disc stenosis of intervertebral foramina of thoracic region: Secondary | ICD-10-CM | POA: Diagnosis not present

## 2019-08-19 DIAGNOSIS — M1990 Unspecified osteoarthritis, unspecified site: Secondary | ICD-10-CM | POA: Diagnosis not present

## 2019-08-24 DIAGNOSIS — G4730 Sleep apnea, unspecified: Secondary | ICD-10-CM | POA: Diagnosis not present

## 2019-08-24 DIAGNOSIS — I10 Essential (primary) hypertension: Secondary | ICD-10-CM | POA: Diagnosis not present

## 2019-08-24 DIAGNOSIS — M47814 Spondylosis without myelopathy or radiculopathy, thoracic region: Secondary | ICD-10-CM | POA: Diagnosis not present

## 2019-08-24 DIAGNOSIS — M1990 Unspecified osteoarthritis, unspecified site: Secondary | ICD-10-CM | POA: Diagnosis not present

## 2019-08-24 DIAGNOSIS — M9972 Connective tissue and disc stenosis of intervertebral foramina of thoracic region: Secondary | ICD-10-CM | POA: Diagnosis not present

## 2019-08-24 DIAGNOSIS — E1151 Type 2 diabetes mellitus with diabetic peripheral angiopathy without gangrene: Secondary | ICD-10-CM | POA: Diagnosis not present

## 2019-08-24 DIAGNOSIS — M47813 Spondylosis without myelopathy or radiculopathy, cervicothoracic region: Secondary | ICD-10-CM | POA: Diagnosis not present

## 2019-08-24 DIAGNOSIS — I872 Venous insufficiency (chronic) (peripheral): Secondary | ICD-10-CM | POA: Diagnosis not present

## 2019-08-24 DIAGNOSIS — M4805 Spinal stenosis, thoracolumbar region: Secondary | ICD-10-CM | POA: Diagnosis not present

## 2019-09-03 DIAGNOSIS — M9972 Connective tissue and disc stenosis of intervertebral foramina of thoracic region: Secondary | ICD-10-CM | POA: Diagnosis not present

## 2019-09-03 DIAGNOSIS — M4805 Spinal stenosis, thoracolumbar region: Secondary | ICD-10-CM | POA: Diagnosis not present

## 2019-09-03 DIAGNOSIS — E1151 Type 2 diabetes mellitus with diabetic peripheral angiopathy without gangrene: Secondary | ICD-10-CM | POA: Diagnosis not present

## 2019-09-03 DIAGNOSIS — M47814 Spondylosis without myelopathy or radiculopathy, thoracic region: Secondary | ICD-10-CM | POA: Diagnosis not present

## 2019-09-03 DIAGNOSIS — G4730 Sleep apnea, unspecified: Secondary | ICD-10-CM | POA: Diagnosis not present

## 2019-09-03 DIAGNOSIS — M47813 Spondylosis without myelopathy or radiculopathy, cervicothoracic region: Secondary | ICD-10-CM | POA: Diagnosis not present

## 2019-09-03 DIAGNOSIS — I10 Essential (primary) hypertension: Secondary | ICD-10-CM | POA: Diagnosis not present

## 2019-09-03 DIAGNOSIS — I872 Venous insufficiency (chronic) (peripheral): Secondary | ICD-10-CM | POA: Diagnosis not present

## 2019-09-03 DIAGNOSIS — M1990 Unspecified osteoarthritis, unspecified site: Secondary | ICD-10-CM | POA: Diagnosis not present

## 2019-09-04 ENCOUNTER — Other Ambulatory Visit: Payer: Self-pay | Admitting: Cardiovascular Disease

## 2019-09-07 DIAGNOSIS — M47813 Spondylosis without myelopathy or radiculopathy, cervicothoracic region: Secondary | ICD-10-CM | POA: Diagnosis not present

## 2019-09-07 DIAGNOSIS — M47814 Spondylosis without myelopathy or radiculopathy, thoracic region: Secondary | ICD-10-CM | POA: Diagnosis not present

## 2019-09-07 DIAGNOSIS — I872 Venous insufficiency (chronic) (peripheral): Secondary | ICD-10-CM | POA: Diagnosis not present

## 2019-09-07 DIAGNOSIS — E1151 Type 2 diabetes mellitus with diabetic peripheral angiopathy without gangrene: Secondary | ICD-10-CM | POA: Diagnosis not present

## 2019-09-07 DIAGNOSIS — G4730 Sleep apnea, unspecified: Secondary | ICD-10-CM | POA: Diagnosis not present

## 2019-09-07 DIAGNOSIS — I10 Essential (primary) hypertension: Secondary | ICD-10-CM | POA: Diagnosis not present

## 2019-09-07 DIAGNOSIS — M1990 Unspecified osteoarthritis, unspecified site: Secondary | ICD-10-CM | POA: Diagnosis not present

## 2019-09-07 DIAGNOSIS — M4805 Spinal stenosis, thoracolumbar region: Secondary | ICD-10-CM | POA: Diagnosis not present

## 2019-09-07 DIAGNOSIS — M9972 Connective tissue and disc stenosis of intervertebral foramina of thoracic region: Secondary | ICD-10-CM | POA: Diagnosis not present

## 2019-09-07 NOTE — Telephone Encounter (Signed)
Pt is overdue for follow-up last seen 07/20/19 and was due to follow-up on 07/27/19.  Pt cancelled appt and did not r/s visit. Called spoke with pt, scheduled appt for 09/08/19 at 1:00pm. Will refill rx at that time.

## 2019-09-08 ENCOUNTER — Other Ambulatory Visit: Payer: Self-pay

## 2019-09-08 ENCOUNTER — Ambulatory Visit (INDEPENDENT_AMBULATORY_CARE_PROVIDER_SITE_OTHER): Payer: Medicare HMO | Admitting: *Deleted

## 2019-09-08 DIAGNOSIS — Z7901 Long term (current) use of anticoagulants: Secondary | ICD-10-CM

## 2019-09-08 DIAGNOSIS — Z86711 Personal history of pulmonary embolism: Secondary | ICD-10-CM

## 2019-09-08 DIAGNOSIS — Z5181 Encounter for therapeutic drug level monitoring: Secondary | ICD-10-CM | POA: Diagnosis not present

## 2019-09-08 LAB — POCT INR: INR: 1.8 — AB (ref 2.0–3.0)

## 2019-09-08 NOTE — Patient Instructions (Signed)
Description   Take 1.5 tablets today, then continue taking 1/2 a tablet daily except for 1 tablet on Sunday, Tuesday and Thursdays.  Recheck INR in 2 weeks. Call us with any changes # [phone].

## 2019-09-14 DIAGNOSIS — G4730 Sleep apnea, unspecified: Secondary | ICD-10-CM | POA: Diagnosis not present

## 2019-09-14 DIAGNOSIS — M47814 Spondylosis without myelopathy or radiculopathy, thoracic region: Secondary | ICD-10-CM | POA: Diagnosis not present

## 2019-09-14 DIAGNOSIS — I872 Venous insufficiency (chronic) (peripheral): Secondary | ICD-10-CM | POA: Diagnosis not present

## 2019-09-14 DIAGNOSIS — M9972 Connective tissue and disc stenosis of intervertebral foramina of thoracic region: Secondary | ICD-10-CM | POA: Diagnosis not present

## 2019-09-14 DIAGNOSIS — I10 Essential (primary) hypertension: Secondary | ICD-10-CM | POA: Diagnosis not present

## 2019-09-14 DIAGNOSIS — M1990 Unspecified osteoarthritis, unspecified site: Secondary | ICD-10-CM | POA: Diagnosis not present

## 2019-09-14 DIAGNOSIS — M47813 Spondylosis without myelopathy or radiculopathy, cervicothoracic region: Secondary | ICD-10-CM | POA: Diagnosis not present

## 2019-09-14 DIAGNOSIS — M4805 Spinal stenosis, thoracolumbar region: Secondary | ICD-10-CM | POA: Diagnosis not present

## 2019-09-14 DIAGNOSIS — E1151 Type 2 diabetes mellitus with diabetic peripheral angiopathy without gangrene: Secondary | ICD-10-CM | POA: Diagnosis not present

## 2019-09-21 ENCOUNTER — Other Ambulatory Visit: Payer: Self-pay | Admitting: Family Medicine

## 2019-09-21 DIAGNOSIS — I10 Essential (primary) hypertension: Secondary | ICD-10-CM | POA: Diagnosis not present

## 2019-09-21 DIAGNOSIS — M4805 Spinal stenosis, thoracolumbar region: Secondary | ICD-10-CM | POA: Diagnosis not present

## 2019-09-21 DIAGNOSIS — M9972 Connective tissue and disc stenosis of intervertebral foramina of thoracic region: Secondary | ICD-10-CM | POA: Diagnosis not present

## 2019-09-21 DIAGNOSIS — M47814 Spondylosis without myelopathy or radiculopathy, thoracic region: Secondary | ICD-10-CM | POA: Diagnosis not present

## 2019-09-21 DIAGNOSIS — I872 Venous insufficiency (chronic) (peripheral): Secondary | ICD-10-CM | POA: Diagnosis not present

## 2019-09-21 DIAGNOSIS — M47813 Spondylosis without myelopathy or radiculopathy, cervicothoracic region: Secondary | ICD-10-CM | POA: Diagnosis not present

## 2019-09-21 DIAGNOSIS — M1990 Unspecified osteoarthritis, unspecified site: Secondary | ICD-10-CM | POA: Diagnosis not present

## 2019-09-21 DIAGNOSIS — G4730 Sleep apnea, unspecified: Secondary | ICD-10-CM | POA: Diagnosis not present

## 2019-09-21 DIAGNOSIS — E1151 Type 2 diabetes mellitus with diabetic peripheral angiopathy without gangrene: Secondary | ICD-10-CM | POA: Diagnosis not present

## 2019-09-21 NOTE — Telephone Encounter (Signed)
Medication Refill - Medication: metoprolol succinate (TOPROL-XL) 50 MG 24 hr tablet    Has the patient contacted their pharmacy? Yes.   (Agent: If no, request that the patient contact the pharmacy for the refill.) (Agent: If yes, when and what did the pharmacy advise?)  Preferred Pharmacy (with phone number or street name):  [facility], [place] [phone] (Phone) [phone] (Fax)     Agent: Please be advised that RX refills may take up to 3 business days. We ask that you follow-up with your pharmacy.

## 2019-09-22 ENCOUNTER — Ambulatory Visit (INDEPENDENT_AMBULATORY_CARE_PROVIDER_SITE_OTHER): Payer: Medicare HMO

## 2019-09-22 ENCOUNTER — Other Ambulatory Visit: Payer: Self-pay

## 2019-09-22 DIAGNOSIS — Z86711 Personal history of pulmonary embolism: Secondary | ICD-10-CM | POA: Diagnosis not present

## 2019-09-22 DIAGNOSIS — R600 Localized edema: Secondary | ICD-10-CM

## 2019-09-22 DIAGNOSIS — Z7901 Long term (current) use of anticoagulants: Secondary | ICD-10-CM

## 2019-09-22 LAB — POCT INR: INR: 1.9 — AB (ref 2.0–3.0)

## 2019-09-22 NOTE — Patient Instructions (Signed)
Description   Start taking 1 tablet daily except 1/2 tablet on Mondays, Wednesdays and Fridays.  Recheck INR in 3 weeks. Call us with any changes # [phone].

## 2019-09-25 DIAGNOSIS — M79604 Pain in right leg: Secondary | ICD-10-CM | POA: Diagnosis not present

## 2019-09-28 DIAGNOSIS — M4805 Spinal stenosis, thoracolumbar region: Secondary | ICD-10-CM | POA: Diagnosis not present

## 2019-09-28 DIAGNOSIS — E1151 Type 2 diabetes mellitus with diabetic peripheral angiopathy without gangrene: Secondary | ICD-10-CM | POA: Diagnosis not present

## 2019-09-28 DIAGNOSIS — I10 Essential (primary) hypertension: Secondary | ICD-10-CM | POA: Diagnosis not present

## 2019-09-28 DIAGNOSIS — M9972 Connective tissue and disc stenosis of intervertebral foramina of thoracic region: Secondary | ICD-10-CM | POA: Diagnosis not present

## 2019-09-28 DIAGNOSIS — M47813 Spondylosis without myelopathy or radiculopathy, cervicothoracic region: Secondary | ICD-10-CM | POA: Diagnosis not present

## 2019-09-28 DIAGNOSIS — G4730 Sleep apnea, unspecified: Secondary | ICD-10-CM | POA: Diagnosis not present

## 2019-09-28 DIAGNOSIS — I872 Venous insufficiency (chronic) (peripheral): Secondary | ICD-10-CM | POA: Diagnosis not present

## 2019-09-28 DIAGNOSIS — M47814 Spondylosis without myelopathy or radiculopathy, thoracic region: Secondary | ICD-10-CM | POA: Diagnosis not present

## 2019-09-28 DIAGNOSIS — M1990 Unspecified osteoarthritis, unspecified site: Secondary | ICD-10-CM | POA: Diagnosis not present

## 2019-09-29 ENCOUNTER — Telehealth: Payer: Self-pay

## 2019-09-29 NOTE — Telephone Encounter (Signed)
Sharyn Lull PT from [facility] called, stated that this patient has been now discharged from [facility] and is ready to be transferred to Out Patient therapies along with Neuro rehab.

## 2019-10-02 LAB — HEMOGLOBIN A1C: Hemoglobin A1C: 9.9

## 2019-10-02 LAB — MICROALBUMIN, URINE: Microalb, Ur: <7

## 2019-10-05 NOTE — Progress Notes (Signed)
Cardiology Office Note    Date:  10/07/2019   ID:  Nicolas [name], DOB Aug 10, 1966, MRN [id number]  PCP:  Shawnee Knapp, MD  Cardiologist: Lauree Chandler, MD EPS: None  Chief Complaint  Patient presents with   Follow-up    History of Present Illness:  Nicolas [name] is a 53 y.o. male with history of hypertension, recurrent PE status post IVC filter and on chronic Coumadin, lower extremity edema felt secondary to venous insufficiency, morbid obesity.  Patient last saw Dr. Angelena Form in 2017.  Last echo [id number] normal LV systolic function EF 55 to 60% with grade 2 DD.  Saw Daune Perch, NP 04/2018 and was doing well except for limitation due to spinal stenosis.  Patient had thoracic and lumbar laminectomies for spinal stenosis and myelopathy and completed rehab 05/2019.  Patient comes in for f/u. Doing much better with rehab and starting to be able to walk some. BP has been running high-170's when rehab checks him-4 weeks in a row.Eating out a lot-fast food daily.  Lives with his 94 year old son.  Past Medical History:  Diagnosis Date   Arthritis    Clotting disorder ([place])    pulmonary embolus, DVT   Depression    Diabetes mellitus without complication ([place])    no meds, watching diet   DVT (deep venous thrombosis) ([place])    Summer 2012   Hypertension    Neuromuscular disorder ([place])    spinal stenosis.- pt walks with a walker short distance   Peripheral vascular disease ([place])    PONV (postoperative nausea and vomiting)    Pulmonary embolism ([place])    x 3 in 2005, 2008, 2010   Sleep apnea    does not wear a c-pap   Spinal stenosis     Past Surgical History:  Procedure Laterality Date   ANTERIOR CERVICAL CORPECTOMY N/A 01/26/2019   Procedure: Cervical Four Corpectomy with Cervical Three-Four, Cervical Four-Five interbody fusion, prosthesis, explore old fusion, possible removal of old plate;  Surgeon: [name], MD;  Location: [facility];  Service: Neurosurgery;   Laterality: N/A;  anterior approach   CHOLECYSTECTOMY N/A 05/24/2014   Procedure: LAPAROSCOPIC CHOLECYSTECTOMY;  Surgeon: Zenovia Jarred, MD;  Location: [facility];  Service: General;  Laterality: N/A;   ERCP N/A 05/25/2014   Procedure: ENDOSCOPIC RETROGRADE CHOLANGIOPANCREATOGRAPHY (ERCP);  Surgeon: Beryle Beams, MD;  Location: [facility];  Service: Endoscopy;  Laterality: N/A;   ERCP N/A 07/16/2014   Procedure: ENDOSCOPIC RETROGRADE CHOLANGIOPANCREATOGRAPHY (ERCP);  Surgeon: Beryle Beams, MD;  Location: [facility];  Service: Endoscopy;  Laterality: N/A;   ivp filter  jan 2012   LUMBAR LAMINECTOMY/DECOMPRESSION MICRODISCECTOMY N/A 05/14/2019   Procedure: LAMINECTOMY AND FORAMINOTOMY THORACIC FIVE- THORACIC SIX, THORACIC ELEVEN- THORACIC THORACIC, LUMBAR THREE- LUMBAR FOUR;  Surgeon: [name], MD;  Location: [facility];  Service: Neurosurgery;  Laterality: N/A;  posterior   SPINE SURGERY  11/30/11   Total 6 back surgeries   SPINE SURGERY  [id number]   Dr. Cathren Laine in [place], [place]     08/2003, cervial spine 2005, 2010 lower back, 2011 lower back, 2012 & 2013   UPPER GASTROINTESTINAL ENDOSCOPY     WISDOM TOOTH EXTRACTION     2 removed    Current Medications: Current Meds  Medication Sig   acetaminophen (TYLENOL) 325 MG tablet Take 2 tablets (650 mg total) by mouth every 4 (four) hours as needed for mild pain ((score 1 to 3) or temp > 100.5).  furosemide (LASIX) 40 MG tablet TAKE 1 TABLET EVERY DAY, NEED MD APPOINTMENT WITH CARDS FOR REFILLS   gabapentin (NEURONTIN) 300 MG capsule Take 1 capsule (300 mg total) by mouth 3 (three) times daily.   metoprolol succinate (TOPROL-XL) 50 MG 24 hr tablet Take 1.5 tablets (75 mg total) by mouth once a day   Multiple Vitamin (MULTIVITAMIN WITH MINERALS) TABS tablet Take 1 tablet by mouth daily. Reported on 11/09/2015   potassium chloride SA (K-DUR) 20 MEQ tablet TAKE 1 TABLET EVERY DAY, NEED MD APPOINTMENT WITH CARDS FOR  REFILLS   warfarin (COUMADIN) 10 MG tablet Take as directed by the coumadin clinic   [DISCONTINUED] metoprolol succinate (TOPROL-XL) 50 MG 24 hr tablet Take 1 tablet (50 mg total) by mouth every morning. Take with or immediately following a meal.     Allergies:   Ace inhibitors and Lisinopril   Social History   Socioeconomic History   Marital status: Single    Spouse name: Not on file   Number of children: 3   Years of education: 10   Highest education level: 10th grade  Occupational History   Occupation: Disability  Social Designer, fashion/clothing strain: Not hard at all   Food insecurity    Worry: Never true    Inability: Never true   Transportation needs    Medical: No    Non-medical: No  Tobacco Use   Smoking status: Former Smoker    Packs/day: 1.50    Years: 10.00    Pack years: 15.00    Types: Cigarettes    Quit date: 04/26/2001    Years since quitting: 18.4   Smokeless tobacco: Never Used  Substance and Sexual Activity   Alcohol use: Yes    Alcohol/week: 1.0 standard drinks    Types: 1 Standard drinks or equivalent per week    Comment: very little   Drug use: Yes    Types: Marijuana    Comment: daily   Sexual activity: Yes  Lifestyle   Physical activity    Days per week: 6 days    Minutes per session: 50 min   Stress: Not at all  Relationships   Social connections    Talks on phone: More than three times a week    Gets together: More than three times a week    Attends religious service: Never    Active member of club or organization: Yes    Attends meetings of clubs or organizations: More than 4 times per year    Relationship status: Never married  Other Topics Concern   Not on file  Social History Narrative   Patient is single and lives at home, his daughter lives with him.   Patient has three children.   Patient is disabled.   Patient has a 10 grade education.   Patient is right-handed.   Patient drinks 1 or 2 sodas and tea  per week.     Family History:  The patient's   family history includes Cancer (age of onset: 51) in his mother; Diabetes in his brother and father; Hypertension in his brother, brother, and father; Prostate cancer in his father.   ROS:   Please see the history of present illness.    ROS All other systems reviewed and are negative.   PHYSICAL EXAM:   VS:  BP 140/60    Pulse 86    Ht 5\' 10"  (1.778 m)    Wt 300 lb (136.1 kg)    SpO2  95%    BMI 43.05 kg/m   Physical Exam  GEN: Obese, in no acute distress  Neck: no JVD, carotid bruits, or masses Cardiac:RRR; no murmurs, rubs, or gallops  Respiratory:  clear to auscultation bilaterally, normal work of breathing GI: soft, nontender, nondistended, + BS Ext: Chronic brawny edema  neuro:  Alert and Oriented x 3 Psych: euthymic mood, full affect  Wt Readings from Last 3 Encounters:  10/07/19 300 lb (136.1 kg)  06/15/19 (!) 305 lb (138.3 kg)  05/21/19 (!) 300 lb 11.3 oz (136.4 kg)      Studies/Labs Reviewed:   EKG:  EKG is not ordered today.    Recent Labs: 05/22/2019: ALT 49; BUN 13; Creatinine, Ser 0.90; Potassium 4.9; Sodium 134 06/01/2019: Hemoglobin 13.2; Platelets 326   Lipid Panel    Component Value Date/Time   CHOL 155 10/31/2018 1303   TRIG 205 (H) 10/31/2018 1303   HDL 37 (L) 10/31/2018 1303   CHOLHDL 4.2 10/31/2018 1303   CHOLHDL 3.4 10/04/2016 0946   VLDL 23 10/04/2016 0946   LDLCALC 77 10/31/2018 1303   LDLDIRECT 82 05/01/2013 1545    Additional studies/ records that were reviewed today include:     Echocardiogram 05/23/2014 Study Conclusions - Left ventricle: The cavity size was normal. Wall thickness was   normal. Systolic function was normal. The estimated ejection   fraction was in the range of 55% to 60%. Features are consistent   with a pseudonormal left ventricular filling pattern, with   concomitant abnormal relaxation and increased filling pressure   (grade 2 diastolic dysfunction). -  Impressions: Technically limited study due to poor sound wave   transmission.  Impressions: - Technically limited study due to poor sound wave transmission.    ASSESSMENT:    1. Hx pulmonary embolism   2. Lower extremity edema   3. Essential hypertension   4. Diabetes mellitus type 2 in obese ([place])   5. Morbid obesity ([place])      PLAN:  In order of problems listed above:  Recurrent pulmonary embolus status post IVC filter and on chronic Coumadin  Lower extremity edema felt secondary to venous stasis, chronic DVTs, obesity on as needed Lasix  Essential hypertension blood pressure has been running high.  Since his back surgery and being in a wheelchair he has been eating fast foods daily.  2 g sodium diet.  Increase Toprol to 75 mg once daily.  Follow-up virtual visit in 2 to 3 weeks.  Diabetes mellitus-patient has been taken off all his medications because says it only goes up when he is on steroids for back problems. followed by PCP.  Morbid obesity weight loss recommended.  Hopefully with ongoing rehab he will be able to get more exercise in the future.    Medication Adjustments/Labs and Tests Ordered: Current medicines are reviewed at length with the patient today.  Concerns regarding medicines are outlined above.  Medication changes, Labs and Tests ordered today are listed in the Patient Instructions below. There are no Patient Instructions on file for this visit.   Signed, Ermalinda Barrios, PA-C  10/07/2019 2:18 PM    [facility] [address], [place], [place]  [address] Phone: [phone]; Fax: [phone]

## 2019-10-07 ENCOUNTER — Encounter: Payer: Self-pay | Admitting: Physician Assistant

## 2019-10-07 ENCOUNTER — Ambulatory Visit: Payer: Medicare HMO | Admitting: Physician Assistant

## 2019-10-07 ENCOUNTER — Other Ambulatory Visit: Payer: Self-pay

## 2019-10-07 VITALS — BP 140/60 | HR 86 | Ht 70.0 in | Wt 300.0 lb

## 2019-10-07 DIAGNOSIS — R600 Localized edema: Secondary | ICD-10-CM

## 2019-10-07 DIAGNOSIS — E1169 Type 2 diabetes mellitus with other specified complication: Secondary | ICD-10-CM

## 2019-10-07 DIAGNOSIS — E669 Obesity, unspecified: Secondary | ICD-10-CM

## 2019-10-07 DIAGNOSIS — Z86711 Personal history of pulmonary embolism: Secondary | ICD-10-CM | POA: Diagnosis not present

## 2019-10-07 DIAGNOSIS — I10 Essential (primary) hypertension: Secondary | ICD-10-CM | POA: Diagnosis not present

## 2019-10-07 MED ORDER — METOPROLOL SUCCINATE ER 50 MG PO TB24: ORAL_TABLET | ORAL | 3 refills | Status: DC

## 2019-10-07 NOTE — Patient Instructions (Signed)
Medication Instructions:  Your physician has recommended you make the following change in your medication:   INCREASE: metoprolol succinate (Toprol-XL) to 1.5 tablets (75 mg total) by mouth once a day  If you need a refill on your cardiac medications before your next appointment, please call your pharmacy.   Lab work: None Ordered  If you have labs (blood work) drawn today and your tests are completely normal, you will receive your results only by: Marland Kitchen MyChart Message (if you have MyChart) OR . A paper copy in the mail If you have any lab test that is abnormal or we need to change your treatment, we will call you to review the results.  Testing/Procedures: None ordered  Follow-Up: . Follow up with Ermalinda Barrios, PA via VIRTUAL Visit on 10/28/19 at 1:00 PM  Any Other Special Instructions Will Be Listed Below (If Applicable).  Two Gram Sodium Diet 2000 mg  What is Sodium? Sodium is a mineral found naturally in many foods. The most significant source of sodium in the diet is table salt, which is about 40% sodium.  Processed, convenience, and preserved foods also contain a large amount of sodium.  The body needs only 500 mg of sodium daily to function,  A normal diet provides more than enough sodium even if you do not use salt.  Why Limit Sodium? A build up of sodium in the body can cause thirst, increased blood pressure, shortness of breath, and water retention.  Decreasing sodium in the diet can reduce edema and risk of heart attack or stroke associated with high blood pressure.  Keep in mind that there are many other factors involved in these health problems.  Heredity, obesity, lack of exercise, cigarette smoking, stress and what you eat all play a role.  General Guidelines:  Do not add salt at the table or in cooking.  One teaspoon of salt contains over 2 grams of sodium.  Read food labels  Avoid processed and convenience foods  Ask your dietitian before eating any foods not  dicussed in the menu planning guidelines  Consult your physician if you wish to use a salt substitute or a sodium containing medication such as antacids.  Limit milk and milk products to 16 oz (2 cups) per day.  Shopping Hints:  READ LABELS!! "Dietetic" does not necessarily mean low sodium.  Salt and other sodium ingredients are often added to foods during processing.   Menu Planning Guidelines Food Group Choose More Often Avoid  Beverages (see also the milk group All fruit juices, low-sodium, salt-free vegetables juices, low-sodium carbonated beverages Regular vegetable or tomato juices, commercially softened water used for drinking or cooking  Breads and Cereals Enriched white, wheat, rye and pumpernickel bread, hard rolls and dinner rolls; muffins, cornbread and waffles; most dry cereals, cooked cereal without added salt; unsalted crackers and breadsticks; low sodium or homemade bread crumbs Bread, rolls and crackers with salted tops; quick breads; instant hot cereals; pancakes; commercial bread stuffing; self-rising flower and biscuit mixes; regular bread crumbs or cracker crumbs  Desserts and Sweets Desserts and sweets mad with mild should be within allowance Instant pudding mixes and cake mixes  Fats Butter or margarine; vegetable oils; unsalted salad dressings, regular salad dressings limited to 1 Tbs; light, sour and heavy cream Regular salad dressings containing bacon fat, bacon bits, and salt pork; snack dips made with instant soup mixes or processed cheese; salted nuts  Fruits Most fresh, frozen and canned fruits Fruits processed with salt or sodium-containing ingredient (  some dried fruits are processed with sodium sulfites        Vegetables Fresh, frozen vegetables and low- sodium canned vegetables Regular canned vegetables, sauerkraut, pickled vegetables, and others prepared in brine; frozen vegetables in sauces; vegetables seasoned with ham, bacon or salt pork  Condiments,  Sauces, Miscellaneous  Salt substitute with physician's approval; pepper, herbs, spices; vinegar, lemon or lime juice; hot pepper sauce; garlic powder, onion powder, low sodium soy sauce (1 Tbs.); low sodium condiments (ketchup, chili sauce, mustard) in limited amounts (1 tsp.) fresh ground horseradish; unsalted tortilla chips, pretzels, potato chips, popcorn, salsa (1/4 cup) Any seasoning made with salt including garlic salt, celery salt, onion salt, and seasoned salt; sea salt, rock salt, kosher salt; meat tenderizers; monosodium glutamate; mustard, regular soy sauce, barbecue, sauce, chili sauce, teriyaki sauce, steak sauce, Worcestershire sauce, and most flavored vinegars; canned gravy and mixes; regular condiments; salted snack foods, olives, picles, relish, horseradish sauce, catsup   Food preparation: Try these seasonings Meats:    Pork Sage, onion Serve with applesauce  Chicken Poultry seasoning, thyme, parsley Serve with cranberry sauce  Lamb Curry powder, rosemary, garlic, thyme Serve with mint sauce or jelly  Veal Marjoram, basil Serve with current jelly, cranberry sauce  Beef Pepper, bay leaf Serve with dry mustard, unsalted chive butter  Fish [place] leaf, dill Serve with unsalted lemon butter, unsalted parsley butter  Vegetables:    Asparagus Lemon juice   Broccoli Lemon juice   Carrots Mustard dressing parsley, mint, nutmeg, glazed with unsalted butter and sugar   Green beans Marjoram, lemon juice, nutmeg,dill seed   Tomatoes Basil, marjoram, onion   Spice /blend for [employer]" 4 tsp ground thyme 1 tsp ground sage 3 tsp ground rosemary 4 tsp ground marjoram   Test your knowledge 1. A product that says "Salt Free" may still contain sodium. True or False 2. Garlic Powder and Hot Pepper Sauce an be used as alternative seasonings.True or False 3. Processed foods have more sodium than fresh foods.  True or False 4. Canned Vegetables have less sodium than froze True or False  WAYS  TO DECREASE YOUR SODIUM INTAKE 1. Avoid the use of added salt in cooking and at the table.  Table salt (and other prepared seasonings which contain salt) is probably one of the greatest sources of sodium in the diet.  Unsalted foods can gain flavor from the sweet, sour, and butter taste sensations of herbs and spices.  Instead of using salt for seasoning, try the following seasonings with the foods listed.  Remember: how you use them to enhance natural food flavors is limited only by your creativity... Allspice-Meat, fish, eggs, fruit, peas, red and yellow vegetables Almond Extract-Fruit baked goods Anise Seed-Sweet breads, fruit, carrots, beets, cottage cheese, cookies (tastes like licorice) Basil-Meat, fish, eggs, vegetables, rice, vegetables salads, soups, sauces Bay Leaf-Meat, fish, stews, poultry Burnet-Salad, vegetables (cucumber-like flavor) Caraway Seed-Bread, cookies, cottage cheese, meat, vegetables, cheese, rice Cardamon-Baked goods, fruit, soups Celery Powder or seed-Salads, salad dressings, sauces, meatloaf, soup, bread.Do not use  celery salt Chervil-Meats, salads, fish, eggs, vegetables, cottage cheese (parsley-like flavor) Chili Power-Meatloaf, chicken cheese, corn, eggplant, egg dishes Chives-Salads cottage cheese, egg dishes, soups, vegetables, sauces Cilantro-Salsa, casseroles Cinnamon-Baked goods, fruit, pork, lamb, chicken, carrots Cloves-Fruit, baked goods, fish, pot roast, green beans, beets, carrots Coriander-Pastry, cookies, meat, salads, cheese (lemon-orange flavor) Cumin-Meatloaf, fish,cheese, eggs, cabbage,fruit pie (caraway flavor) [employer], fruit, eggs, fish, poultry, cottage cheese, vegetables Dill Seed-Meat, cottage cheese, poultry, vegetables, fish, salads, bread  Fennel Seed-Bread, cookies, apples, pork, eggs, fish, beets, cabbage, cheese, Licorice-like flavor Garlic-(buds or powder) Salads, meat, poultry, fish, bread, butter, vegetables, potatoes.Do  not  use garlic salt Ginger-Fruit, vegetables, baked goods, meat, fish, poultry Horseradish Root-Meet, vegetables, butter Lemon Juice or Extract-Vegetables, fruit, tea, baked goods, fish salads Mace-Baked goods fruit, vegetables, fish, poultry (taste like nutmeg) Maple Extract-Syrups Marjoram-Meat, chicken, fish, vegetables, breads, green salads (taste like Sage) Mint-Tea, lamb, sherbet, vegetables, desserts, carrots, cabbage Mustard, Dry or Seed-Cheese, eggs, meats, vegetables, poultry Nutmeg-Baked goods, fruit, chicken, eggs, vegetables, desserts Onion Powder-Meat, fish, poultry, vegetables, cheese, eggs, bread, rice salads (Do not use   Onion salt) Orange Extract-Desserts, baked goods Oregano-Pasta, eggs, cheese, onions, pork, lamb, fish, chicken, vegetables, green salads Paprika-Meat, fish, poultry, eggs, cheese, vegetables Parsley Flakes-Butter, vegetables, meat fish, poultry, eggs, bread, salads (certain forms may   Contain sodium Pepper-Meat fish, poultry, vegetables, eggs Peppermint Extract-Desserts, baked goods Poppy Seed-Eggs, bread, cheese, fruit dressings, baked goods, noodles, vegetables, cottage  [employer], poultry, meat, fish, cauliflower, turnips,eggs bread Saffron-Rice, bread, veal, chicken, fish, eggs Sage-Meat, fish, poultry, onions, eggplant, tomateos, pork, stews Savory-Eggs, salads, poultry, meat, rice, vegetables, soups, pork Tarragon-Meat, poultry, fish, eggs, butter, vegetables (licorice-like flavor)  Thyme-Meat, poultry, fish, eggs, vegetables, (clover-like flavor), sauces, soups Tumeric-Salads, butter, eggs, fish, rice, vegetables (saffron-like flavor) Vanilla Extract-Baked goods, candy Vinegar-Salads, vegetables, meat marinades Walnut Extract-baked goods, candy  2. Choose your Foods Wisely   The following is a list of foods to avoid which are high in sodium:  Meats-Avoid all smoked, canned, salt cured, dried and  kosher meat and fish as well as Anchovies   [employer] meats:Bologna, Liverwurst, Pastrami Canned meat or fish  Marinated herring Caviar    Pepperoni Corned Beef   Pizza Dried chipped beef  Salami Frozen breaded fish or meat Salt pork Frankfurters or hot dogs  Sardines Gefilte fish   Sausage Ham (boiled ham, Proscuitto Smoked butt    spiced ham)   Spam      TV Dinners Vegetables Canned vegetables (Regular) Relish Canned mushrooms  Sauerkraut Olives    Tomato juice Pickles  Bakery and Dessert Products Canned puddings  Cream pies Cheesecake   Decorated cakes Cookies  Beverages/Juices Tomato juice, regular  Gatorade   V-8 vegetable juice, regular  Breads and Cereals Biscuit mixes   Salted potato chips, corn chips, pretzels Bread stuffing mixes  Salted crackers and rolls Pancake and waffle mixes Self-rising flour  Seasonings Accent    Meat sauces Barbecue sauce  Meat tenderizer Catsup    Monosodium glutamate (MSG) Celery salt   Onion salt Chili sauce   Prepared mustard Garlic salt   Salt, seasoned salt, sea salt Gravy mixes   Soy sauce Horseradish   Steak sauce Ketchup   Tartar sauce Lite salt    Teriyaki sauce Marinade mixes   Worcestershire sauce  Others Baking powder   Cocoa and cocoa mixes Baking soda   Commercial casserole mixes Candy-caramels, chocolate  Dehydrated soups    Bars, fudge,nougats  Instant rice and pasta mixes Canned broth or soup  Maraschino cherries Cheese, aged and processed cheese and cheese spreads  Learning Assessment Quiz  Indicated T (for True) or F (for False) for each of the following statements:  1. _____ Fresh fruits and vegetables and unprocessed grains are generally low in sodium 2. _____ Water may contain a considerable amount of sodium, depending on the source 3. _____ You can always tell if a food is high  in sodium by tasting it 4. _____ Certain laxatives my be high in sodium and should be avoided unless  prescribed   by a physician or pharmacist 5. _____ Salt substitutes may be used freely by anyone on a sodium restricted diet 6. _____ Sodium is present in table salt, food additives and as a natural component of   most foods 7. _____ Table salt is approximately 90% sodium 8. _____ Limiting sodium intake may help prevent excess fluid accumulation in the body 9. _____ On a sodium-restricted diet, seasonings such as bouillon soy sauce, and    cooking wine should be used in place of table salt 10. _____ On an ingredient list, a product which lists monosodium glutamate as the first   ingredient is an appropriate food to include on a low sodium diet  Circle the best answer(s) to the following statements (Hint: there may be more than one correct answer)  11. On a low-sodium diet, some acceptable snack items are:    A. Olives  F. Bean dip   K. Grapefruit juice    B. Salted Pretzels G. Commercial Popcorn   L. Canned peaches    C. Carrot Sticks  H. Bouillon   M. Unsalted nuts   D. [place] fries  I. Peanut butter crackers N. Salami   E. Sweet pickles J. Tomato Juice   O. Pizza  12.  Seasonings that may be used freely on a reduced - sodium diet include   A. Lemon wedges F.Monosodium glutamate K. Celery seed    B.Soysauce   G. Pepper   L. Mustard powder   C. Sea salt  H. Cooking wine  M. Onion flakes   D. Vinegar  E. Prepared horseradish N. Salsa   E. Sage   J. Worcestershire sauce  O. Chutney

## 2019-10-08 ENCOUNTER — Encounter: Payer: Self-pay | Admitting: Physical Medicine & Rehabilitation

## 2019-10-08 ENCOUNTER — Encounter: Payer: Medicare HMO | Attending: Physical Medicine & Rehabilitation | Admitting: Physical Medicine & Rehabilitation

## 2019-10-08 VITALS — BP 140/84 | HR 73 | Temp 97.7°F | Ht 70.0 in | Wt 300.0 lb

## 2019-10-08 DIAGNOSIS — M4714 Other spondylosis with myelopathy, thoracic region: Secondary | ICD-10-CM | POA: Insufficient documentation

## 2019-10-08 MED ORDER — BACLOFEN 10 MG PO TABS: 5.0000 mg | ORAL_TABLET | Freq: Every day | ORAL | 1 refills | Status: DC

## 2019-10-08 NOTE — Patient Instructions (Signed)
You are starting on a new spasm medicine called Baclofen , take at night  You have been referred to PT they will call to schedule appt.  You will see our spinal cord injury specialist Dr Dagoberto Ligas next visit

## 2019-10-08 NOTE — Progress Notes (Signed)
Subjective:    Patient ID: Nicolas [name], male    DOB: 1966-03-23, 53 y.o.   MRN: [id number] 53 year old right-handed male history of hypertension, clotting disorder with pulmonary emboli DVT maintained on chronic Coumadin followed by [facility] as well as cervical myelopathy with cervical disc surgery x2 and previous thoracic laminectomy.  Lives with 35 year old son used a walker prior to admission.  He has a daughter in the area.  Presented 05/14/2019 with progressive lower extremity weakness right greater than left.  X-rays and imaging demonstrated thoracic and lumbar spinal stenosis with myelopathy.  Underwent T5-6 laminectomy, T11-12 laminectomy through a separate incision, L3-4 laminotomy through a separate incision 05/14/2019 per Dr. Arnoldo Morale.  Hospital course pain management.  No back brace required.  Chronic Coumadin resumed.  Patient was admitted for a comprehensive rehab program  Hospital Course: Nicolas [name] was admitted to rehab 05/21/2019 HPI  Finished with home health  Mod I dressing and bathing  Mod I with sit to stand using walker Amb with sup using walker (did not bring today)   Mod I toileting No falls at home   Pt states he has good hand strength  Leg spasms right > left  Mainly ub bed  Pt seeing PCP for BP  The patient states that his bowel and bladder function is normal.  He does not require any suppositories and does not require intermittent catheterization Pain Inventory Average Pain 4 Pain Right Now 3 My pain is sharp, burning, stabbing and tingling  In the last 24 hours, has pain interfered with the following? General activity 2 Relation with others 0 Enjoyment of life 4 What TIME of day is your pain at its worst? evening Sleep (in general) Poor  Pain is worse with: . Pain improves with: . Relief from Meds: 3  Mobility use a walker ability to climb steps?  no do you drive?  no use a wheelchair transfers alone  Function disabled:  date disabled 2009 I need assistance with the following:  household duties and shopping  Neuro/Psych weakness numbness tingling trouble walking spasms  Prior Studies Any changes since last visit?  no  Physicians involved in your care Any changes since last visit?  no   Family History  Problem Relation Age of Onset  . Cancer Mother 82       Lung  . Diabetes Father   . Prostate cancer Father   . Hypertension Father   . Hypertension Brother   . Hypertension Brother   . Diabetes Brother   . Heart attack Neg Hx   . Stroke Neg Hx   . Colon cancer Neg Hx   . Esophageal cancer Neg Hx   . Pancreatic cancer Neg Hx   . Rectal cancer Neg Hx   . Stomach cancer Neg Hx    Social History   Socioeconomic History  . Marital status: Single    Spouse name: Not on file  . Number of children: 3  . Years of education: 10  . Highest education level: 10th grade  Occupational History  . Occupation: Disability  Social Needs  . Financial resource strain: Not hard at all  . Food insecurity    Worry: Never true    Inability: Never true  . Transportation needs    Medical: No    Non-medical: No  Tobacco Use  . Smoking status: Former Smoker    Packs/day: 1.50    Years: 10.00    Pack years: 15.00  Types: Cigarettes    Quit date: 04/26/2001    Years since quitting: 18.4  . Smokeless tobacco: Never Used  Substance and Sexual Activity  . Alcohol use: Yes    Alcohol/week: 1.0 standard drinks    Types: 1 Standard drinks or equivalent per week    Comment: very little  . Drug use: Yes    Types: Marijuana    Comment: daily  . Sexual activity: Yes  Lifestyle  . Physical activity    Days per week: 6 days    Minutes per session: 50 min  . Stress: Not at all  Relationships  . Social connections    Talks on phone: More than three times a week    Gets together: More than three times a week    Attends religious service: Never    Active member of club or organization: Yes    Attends  meetings of clubs or organizations: More than 4 times per year    Relationship status: Never married  Other Topics Concern  . Not on file  Social History Narrative   Patient is single and lives at home, his daughter lives with him.   Patient has three children.   Patient is disabled.   Patient has a 10 grade education.   Patient is right-handed.   Patient drinks 1 or 2 sodas and tea per week.   Past Surgical History:  Procedure Laterality Date  . ANTERIOR CERVICAL CORPECTOMY N/A 01/26/2019   Procedure: Cervical Four Corpectomy with Cervical Three-Four, Cervical Four-Five interbody fusion, prosthesis, explore old fusion, possible removal of old plate;  Surgeon: [name], MD;  Location: [facility];  Service: Neurosurgery;  Laterality: N/A;  anterior approach  . CHOLECYSTECTOMY N/A 05/24/2014   Procedure: LAPAROSCOPIC CHOLECYSTECTOMY;  Surgeon: Zenovia Jarred, MD;  Location: [facility];  Service: General;  Laterality: N/A;  . ERCP N/A 05/25/2014   Procedure: ENDOSCOPIC RETROGRADE CHOLANGIOPANCREATOGRAPHY (ERCP);  Surgeon: Beryle Beams, MD;  Location: [facility];  Service: Endoscopy;  Laterality: N/A;  . ERCP N/A 07/16/2014   Procedure: ENDOSCOPIC RETROGRADE CHOLANGIOPANCREATOGRAPHY (ERCP);  Surgeon: Beryle Beams, MD;  Location: [facility];  Service: Endoscopy;  Laterality: N/A;  . ivp filter  jan 2012  . LUMBAR LAMINECTOMY/DECOMPRESSION MICRODISCECTOMY N/A 05/14/2019   Procedure: LAMINECTOMY AND FORAMINOTOMY THORACIC FIVE- THORACIC SIX, THORACIC ELEVEN- THORACIC THORACIC, LUMBAR THREE- LUMBAR FOUR;  Surgeon: [name], MD;  Location: [facility];  Service: Neurosurgery;  Laterality: N/A;  posterior  . SPINE SURGERY  11/30/11   Total 6 back surgeries  . SPINE SURGERY  [id number]   Dr. Cathren Laine in [place], [place]  . [place]     08/2003, cervial spine 2005, 2010 lower back, 2011 lower back, 2012 & 2013  . UPPER GASTROINTESTINAL ENDOSCOPY    . WISDOM TOOTH EXTRACTION     2 removed   Past  Medical History:  Diagnosis Date  . Arthritis   . Clotting disorder ([place])    pulmonary embolus, DVT  . Depression   . Diabetes mellitus without complication (HCC)    no meds, watching diet  . DVT (deep venous thrombosis) ([place])    Summer 2012  . Hypertension   . Neuromuscular disorder ([place])    spinal stenosis.- pt walks with a walker short distance  . Peripheral vascular disease ([place])   . PONV (postoperative nausea and vomiting)   . Pulmonary embolism ([place])    x 3 in 2005, 2008, 2010  . Sleep apnea    does not wear  a c-pap  . Spinal stenosis    BP 140/84   Pulse 73   Temp 97.7 F (36.5 C)   Ht 5\' 10"  (1.778 m)   Wt 300 lb (136.1 kg)   SpO2 95%   BMI 43.05 kg/m   Opioid Risk Score:   Fall Risk Score:  `1  Depression screen PHQ 2/9  Depression screen [facility] 2/9 06/15/2019 01/12/2019 10/31/2018 10/07/2017 04/04/2017 02/12/2017 12/21/2016  Decreased Interest 2 0 0 0 0 0 0  Down, Depressed, Hopeless 1 0 0 0 0 0 0  PHQ - 2 Score 3 0 0 0 0 0 0  Altered sleeping 3 - - - - - -  Tired, decreased energy 2 - - - - - -  Change in appetite 3 - - - - - -  Feeling bad or failure about yourself  0 - - - - - -  Trouble concentrating 0 - - - - - -  Moving slowly or fidgety/restless 0 - - - - - -  Suicidal thoughts 0 - - - - - -  PHQ-9 Score 11 - - - - - -  Difficult doing work/chores Not difficult at all - - - - - -  Some recent data might be hidden   Review of Systems  Musculoskeletal: Positive for gait problem.       Spasms  Neurological: Positive for weakness and numbness.       Tingling  All other systems reviewed and are negative.      Objective:   Physical Exam Vitals signs and nursing note reviewed.  Constitutional:      Appearance: He is obese.  Eyes:     Extraocular Movements: Extraocular movements intact.     Conjunctiva/sclera: Conjunctivae normal.     Pupils: Pupils are equal, round, and reactive to light.  Neurological:     Mental Status: He is alert and oriented  to person, place, and time.     Comments: 5/5 strength bilateral deltoid, bicep, tricep, 4 - hip flexor, 4 - knee extensor 2- Right ADF 3- Left ADF  Psychiatric:        Attention and Perception: Attention and perception normal.        Mood and Affect: Mood normal.        Speech: Speech normal.        Behavior: Behavior normal. Behavior is cooperative.        Thought Content: Thought content normal.        Cognition and Memory: Cognition normal.           Assessment & Plan:  1.  THoracic myelopathy with paraparesis, he has bilateral foot drop right greater than left No significant neurogenic bowqel or bladder symptoms  Does have R>L leg spasms mainly at night, will start baclofen 10 mg nightly  Will transition to OP PT they can start aquatic therapy as well  The patient will follow-up with Dr. Dagoberto Ligas and spinal cord injury clinic

## 2019-10-12 ENCOUNTER — Other Ambulatory Visit: Payer: Self-pay | Admitting: Neurosurgery

## 2019-10-12 DIAGNOSIS — M79604 Pain in right leg: Secondary | ICD-10-CM

## 2019-10-13 ENCOUNTER — Other Ambulatory Visit: Payer: Self-pay

## 2019-10-13 ENCOUNTER — Ambulatory Visit (INDEPENDENT_AMBULATORY_CARE_PROVIDER_SITE_OTHER): Payer: Medicare HMO | Admitting: *Deleted

## 2019-10-13 DIAGNOSIS — Z7901 Long term (current) use of anticoagulants: Secondary | ICD-10-CM

## 2019-10-13 DIAGNOSIS — Z86711 Personal history of pulmonary embolism: Secondary | ICD-10-CM

## 2019-10-13 DIAGNOSIS — R600 Localized edema: Secondary | ICD-10-CM

## 2019-10-13 LAB — POCT INR: INR: 3.1 — AB (ref 2.0–3.0)

## 2019-10-13 NOTE — Patient Instructions (Signed)
Description   Today take 1 tablet then continue taking 1 tablet daily except 1/2 tablet on Mondays, Wednesdays and Fridays.  Recheck INR in 3-4 weeks. Call us with any changes # [phone].

## 2019-10-26 ENCOUNTER — Ambulatory Visit (HOSPITAL_COMMUNITY)
Admission: RE | Admit: 2019-10-26 | Discharge: 2019-10-26 | Disposition: A | Discharge status: Home / Self Care | Payer: Medicare HMO | Source: Ambulatory Visit | Attending: Neurosurgery | Admitting: Neurosurgery

## 2019-10-26 ENCOUNTER — Other Ambulatory Visit: Payer: Self-pay

## 2019-10-26 ENCOUNTER — Other Ambulatory Visit: Payer: Self-pay | Admitting: Neurosurgery

## 2019-10-26 DIAGNOSIS — M79604 Pain in right leg: Secondary | ICD-10-CM

## 2019-10-26 DIAGNOSIS — M48061 Spinal stenosis, lumbar region without neurogenic claudication: Secondary | ICD-10-CM | POA: Diagnosis not present

## 2019-10-26 MED ORDER — GADOBUTROL 1 MMOL/ML IV SOLN: 10.0000 mL | Freq: Once | INTRAVENOUS | Status: DC | PRN

## 2019-10-26 NOTE — Progress Notes (Signed)
Pt attempted MRI. Pt in severe pain. Pt was unable to completed pre contrast imaging. Pt if further imaging is needed will need pain meds and to be scanned on a wide bore scanner.due to body habitus  Pt is in too much pain to finish exam. 3 sag and 1 acquisition of an axial was obtained. Pt had to abort exam.

## 2019-10-27 NOTE — Progress Notes (Signed)
Virtual Visit via Telephone Note   This visit type was conducted due to national recommendations for restrictions regarding the COVID-19 Pandemic (e.g. social distancing) in an effort to limit this patient's exposure and mitigate transmission in our community.  Due to his co-morbid illnesses, this patient is at least at moderate risk for complications without adequate follow up.  This format is felt to be most appropriate for this patient at this time.  The patient did not have access to video technology/had technical difficulties with video requiring transitioning to audio format only (telephone).  All issues noted in this document were discussed and addressed.  No physical exam could be performed with this format.  Please refer to the patient's chart for his  consent to telehealth for [facility].   Date:  10/28/2019   ID:  [name], DOB November 19, 1966, MRN [id number]  Patient Location: Home Provider Location: Home  PCP:  Shawnee Knapp, MD  Cardiologist:  Lauree Chandler, MD   Electrophysiologist:  None   Evaluation Performed:  Follow-Up Visit  Chief Complaint:  F/U  History of Present Illness:    HARSHAN KEARLEY is a 53 y.o. male with with history of hypertension, recurrent PE status post IVC filter and on chronic Coumadin, lower extremity edema felt secondary to venous insufficiency, morbid obesity.  Patient last saw Dr. Angelena Form in 2017.  Last echo 1751 normal LV systolic function EF 55 to 60% with grade 2 DD.  Saw Daune Perch, NP 04/2018 and was doing well except for limitation due to spinal stenosis.   Patient had thoracic and lumbar laminectomies for spinal stenosis and myelopathy and completed rehab 05/2019.   I saw the patient 10/07/2019 and he was doing much better walking with rehab.  His blood pressure was running high and he was eating a lot of fast food.  I increase his Toprol to 75 mg once daily.  BP up this am. Has been running 147-150's/70. Has cut back on  eating fast food some. Taking lasix 4 days/week.  The patient does not have symptoms concerning for COVID-19 infection (fever, chills, cough, or new shortness of breath).    Past Medical History:  Diagnosis Date  . Arthritis   . Clotting disorder ([place])    pulmonary embolus, DVT  . Depression   . Diabetes mellitus without complication (HCC)    no meds, watching diet  . DVT (deep venous thrombosis) ([place])    Summer 2012  . Hypertension   . Neuromuscular disorder ([place])    spinal stenosis.- pt walks with a walker short distance  . Peripheral vascular disease ([place])   . PONV (postoperative nausea and vomiting)   . Pulmonary embolism ([place])    x 3 in 2005, 2008, 2010  . Sleep apnea    does not wear a c-pap  . Spinal stenosis    Past Surgical History:  Procedure Laterality Date  . ANTERIOR CERVICAL CORPECTOMY N/A 01/26/2019   Procedure: Cervical Four Corpectomy with Cervical Three-Four, Cervical Four-Five interbody fusion, prosthesis, explore old fusion, possible removal of old plate;  Surgeon: [name], MD;  Location: [facility];  Service: Neurosurgery;  Laterality: N/A;  anterior approach  . CHOLECYSTECTOMY N/A 05/24/2014   Procedure: LAPAROSCOPIC CHOLECYSTECTOMY;  Surgeon: Zenovia Jarred, MD;  Location: [facility];  Service: General;  Laterality: N/A;  . ERCP N/A 05/25/2014   Procedure: ENDOSCOPIC RETROGRADE CHOLANGIOPANCREATOGRAPHY (ERCP);  Surgeon: Beryle Beams, MD;  Location: [facility];  Service: Endoscopy;  Laterality: N/A;  .  ERCP N/A 07/16/2014   Procedure: ENDOSCOPIC RETROGRADE CHOLANGIOPANCREATOGRAPHY (ERCP);  Surgeon: Beryle Beams, MD;  Location[facility];  Service: Endoscopy;  Laterality: N/A;  . ivp filter  jan 2012  . LUMBAR LAMINECTOMY/DECOMPRESSION MICRODISCECTOMY N/A 05/14/2019   Procedure: LAMINECTOMY AND FORAMINOTOMY THORACIC FIVE- THORACIC SIX, THORACIC ELEVEN- THORACIC THORACIC, LUMBAR THREE- LUMBAR FOUR;  Surgeon: [name], MD;  Location: [facility];  Service:  Neurosurgery;  Laterality: N/A;  posterior  . SPINE SURGERY  11/30/11   Total 6 back surgeries  . SPINE SURGERY  [id number]   Dr. Cathren Laine in [place], [place]  . [place]     08/2003, cervial spine 2005, 2010 lower back, 2011 lower back, 2012 & 2013  . UPPER GASTROINTESTINAL ENDOSCOPY    . WISDOM TOOTH EXTRACTION     2 removed     Current Meds  Medication Sig  . acetaminophen (TYLENOL) 325 MG tablet Take 2 tablets (650 mg total) by mouth every 4 (four) hours as needed for mild pain ((score 1 to 3) or temp > 100.5).  . baclofen (LIORESAL) 10 MG tablet Take 0.5 tablets (5 mg total) by mouth at bedtime.  . furosemide (LASIX) 40 MG tablet TAKE 1 TABLET EVERY DAY, NEED MD APPOINTMENT WITH CARDS FOR REFILLS  . gabapentin (NEURONTIN) 300 MG capsule Take 1 capsule (300 mg total) by mouth 3 (three) times daily.  . metoprolol succinate (TOPROL-XL) 50 MG 24 hr tablet Take 1.5 tablets (75 mg total) by mouth once a day  . Multiple Vitamin (MULTIVITAMIN WITH MINERALS) TABS tablet Take 1 tablet by mouth daily. Reported on 11/09/2015  . potassium chloride SA (K-DUR) 20 MEQ tablet TAKE 1 TABLET EVERY DAY, NEED MD APPOINTMENT WITH CARDS FOR REFILLS  . warfarin (COUMADIN) 10 MG tablet Take as directed by the coumadin clinic     Allergies:   Ace inhibitors and Lisinopril   Social History   Tobacco Use  . Smoking status: Former Smoker    Packs/day: 1.50    Years: 10.00    Pack years: 15.00    Types: Cigarettes    Quit date: 04/26/2001    Years since quitting: 18.5  . Smokeless tobacco: Never Used  Substance Use Topics  . Alcohol use: Yes    Alcohol/week: 1.0 standard drinks    Types: 1 Standard drinks or equivalent per week    Comment: very little  . Drug use: Yes    Types: Marijuana    Comment: daily     Family Hx: The patient's family history includes Cancer (age of onset: 57) in his mother; Diabetes in his brother and father; Hypertension in his brother, brother, and father; Prostate  cancer in his father. There is no history of Heart attack, Stroke, Colon cancer, Esophageal cancer, Pancreatic cancer, Rectal cancer, or Stomach cancer.  ROS:   Please see the history of present illness.      All other systems reviewed and are negative.   Prior CV studies:   The following studies were reviewed today:  Echocardiogram 05/23/2014 Study Conclusions - Left ventricle: The cavity size was normal. Wall thickness was   normal. Systolic function was normal. The estimated ejection   fraction was in the range of 55% to 60%. Features are consistent   with a pseudonormal left ventricular filling pattern, with   concomitant abnormal relaxation and increased filling pressure   (grade 2 diastolic dysfunction). - Impressions: Technically limited study due to poor sound wave   transmission.  Impressions: - Technically limited study  due to poor sound wave transmission.     Labs/Other Tests and Data Reviewed:    EKG:  No ECG reviewed.  Recent Labs: 05/22/2019: ALT 49; BUN 13; Creatinine, Ser 0.90; Potassium 4.9; Sodium 134 06/01/2019: Hemoglobin 13.2; Platelets 326   Recent Lipid Panel Lab Results  Component Value Date/Time   CHOL 155 10/31/2018 01:03 PM   TRIG 205 (H) 10/31/2018 01:03 PM   HDL 37 (L) 10/31/2018 01:03 PM   CHOLHDL 4.2 10/31/2018 01:03 PM   CHOLHDL 3.4 10/04/2016 09:46 AM   LDLCALC 77 10/31/2018 01:03 PM   LDLDIRECT 82 05/01/2013 03:45 PM    Wt Readings from Last 3 Encounters:  10/08/19 300 lb (136.1 kg)  10/07/19 300 lb (136.1 kg)  06/15/19 (!) 305 lb (138.3 kg)     Objective:    Vital Signs:  BP (!) 160/80   Pulse 79   Ht 5' 10" (1.778 m)   BMI 43.05 kg/m    VITAL SIGNS:  reviewed  ASSESSMENT & PLAN:    Recurrent pulmonary embolus status post IVC filter and on chronic Coumadin   Lower extremity edema felt secondary to venous stasis, chronic DVTs, obesity on as needed Lasix-takes about 4 days a week.   Essential hypertension blood pressure  has been running high.  Since his back surgery and being in a wheelchair he has been eating fast foods daily.  2 g sodium diet.    I increased Toprol to 75 mg once daily. BP still running high.  Add losartan 50 mg once daily.  Check be met in 2 weeks.  Follow-up with me in 1 month.   Diabetes mellitus-patient has been taken off all his medications because says it only goes up when he is on steroids for back problems. followed by PCP.   Morbid obesity weight loss recommended.  Hopefully with ongoing rehab he will be able to get more exercise in the future.  Just had an MRI and waiting to see the orthopedist back    COVID-19 Education: The signs and symptoms of COVID-19 were discussed with the patient and how to seek care for testing (follow up with PCP or arrange E-visit).   The importance of social distancing was discussed today.  Time:   Today, I have spent 9:18 minutes with the patient with telehealth technology discussing the above problems.     Medication Adjustments/Labs and Tests Ordered: Current medicines are reviewed at length with the patient today.  Concerns regarding medicines are outlined above.   Tests Ordered: No orders of the defined types were placed in this encounter.   Medication Changes: No orders of the defined types were placed in this encounter.   Follow Up:  Virtual Visit  in 4 week(s) Ermalinda Barrios PA-C  Signed, Ermalinda Barrios, PA-C  10/28/2019 10:18 AM    [place]

## 2019-10-28 ENCOUNTER — Telehealth: Payer: Self-pay | Admitting: Licensed Clinical Social Worker

## 2019-10-28 ENCOUNTER — Encounter: Payer: Self-pay | Admitting: Physician Assistant

## 2019-10-28 ENCOUNTER — Telehealth (INDEPENDENT_AMBULATORY_CARE_PROVIDER_SITE_OTHER): Payer: Medicare HMO | Admitting: Physician Assistant

## 2019-10-28 VITALS — BP 160/80 | HR 79 | Ht 70.0 in

## 2019-10-28 DIAGNOSIS — Z86711 Personal history of pulmonary embolism: Secondary | ICD-10-CM | POA: Diagnosis not present

## 2019-10-28 DIAGNOSIS — E669 Obesity, unspecified: Secondary | ICD-10-CM | POA: Diagnosis not present

## 2019-10-28 DIAGNOSIS — I10 Essential (primary) hypertension: Secondary | ICD-10-CM | POA: Diagnosis not present

## 2019-10-28 DIAGNOSIS — E1169 Type 2 diabetes mellitus with other specified complication: Secondary | ICD-10-CM | POA: Diagnosis not present

## 2019-10-28 DIAGNOSIS — R600 Localized edema: Secondary | ICD-10-CM | POA: Diagnosis not present

## 2019-10-28 MED ORDER — LOSARTAN POTASSIUM 50 MG PO TABS: 50.0000 mg | ORAL_TABLET | Freq: Every day | ORAL | 3 refills | Status: DC

## 2019-10-28 NOTE — Telephone Encounter (Signed)
CSW referred to assist patient with obtaining a scale. CSW contacted patient to inform scale will be delivered to home. Patient grateful for support and assistance. CSW available as needed. Jackie Lashanti Chambless, LCSW, CCSW-MCS [phone]  

## 2019-10-28 NOTE — Patient Instructions (Addendum)
Medication Instructions:  START losartan 50 mg daily   *If you need a refill on your cardiac medications before your next appointment, please call your pharmacy*  Lab Work: BMET on 11/10/2019  If you have labs (blood work) drawn today and your tests are completely normal, you will receive your results only by: Marland Kitchen MyChart Message (if you have MyChart) OR . A paper copy in the mail If you have any lab test that is abnormal or we need to change your treatment, we will call you to review the results.  Testing/Procedures: None today  Follow-Up: At [facility], you and your health needs are our priority.  As part of our continuing mission to provide you with exceptional heart care, we have created designated Provider Care Teams.  These Care Teams include your primary Cardiologist (physician) and Advanced Practice Providers (APPs -  Physician Assistants and Nurse Practitioners) who all work together to provide you with the care you need, when you need it.  Your next appointment:   4- 6 week(s)  The format for your next appointment:   Either In Person or Virtual  Provider:   Ermalinda Barrios, PA-C   Other Instructions None      Thank you for choosing [facility] !

## 2019-10-29 DIAGNOSIS — M48062 Spinal stenosis, lumbar region with neurogenic claudication: Secondary | ICD-10-CM | POA: Diagnosis not present

## 2019-11-02 ENCOUNTER — Other Ambulatory Visit: Payer: Self-pay | Admitting: Neurosurgery

## 2019-11-02 ENCOUNTER — Telehealth: Payer: Self-pay | Admitting: *Deleted

## 2019-11-02 NOTE — Telephone Encounter (Signed)
Sharyn Lull you just saw this patient 12/2- he apparently needs back surgery.  If you could comment on his cardiac clearance I'll talk to pharmacy about anticoagulation.  Thanks  Kerin Ransom PA-C 11/02/2019 11:46 AM

## 2019-11-02 NOTE — Telephone Encounter (Signed)
   [facility] Pre-operative Risk Assessment    Request for surgical clearance:  1. What type of surgery is being performed? RE-DO LAMINECTOMY   2. When is this surgery scheduled? 11/11/19   3. What type of clearance is required (medical clearance vs. Pharmacy clearance to hold med vs. Both)? BOTH  4. Are there any medications that need to be held prior to surgery and how long? WARFARIN   5. Practice name and name of physician performing surgery? [facility]; DR. Arnoldo Morale   6. What is your office phone number [phone]    7.   What is your office fax number [phone]  8.   Anesthesia type (None, local, MAC, general) ? GENERAL   Julaine Hua 11/02/2019, 11:24 AM  _________________________________________________________________   (provider comments below)

## 2019-11-02 NOTE — Telephone Encounter (Signed)
   Primary Cardiologist: Lauree Chandler, MD  Chart reviewed and patient seen in the office on 10/28/2019. Given past medical history and time since last visit, based on ACC/AHA guidelines, Nicolas [name] would be at acceptable risk for the planned procedure without further cardiovascular testing.   The patient will hold Coumadin 5 days pre op but will require Lovenox bridging which will be arranged by our pharmacist.   I will route this recommendation to the requesting party via [facility] fax function and remove from pre-op pool.  Please call with questions.  Kerin Ransom, PA-C 11/02/2019, 2:11 PM

## 2019-11-02 NOTE — Telephone Encounter (Signed)
Patient with diagnosis of recurrent PE and DVT on warfarin for anticoagulation.    Procedure: RE-DO LAMINECTOMY  Date of procedure: 11/11/2019  CrCl 133 ml/min Platelet count 326  Per office protocol, patient can hold warfarin for 5 days prior to procedure.    Patient WILL need bridging with Lovenox (enoxaparin) around procedure.  Patients coumadin appointment moved to accommodate bridge

## 2019-11-02 NOTE — Telephone Encounter (Signed)
Patient should be stable for surgery. Wasn't having any cardiac complaints. Biggest problem was HTN which I increased meds for. Should be stable for surgery with Lovenox bridge per Coumadin clinic.

## 2019-11-04 ENCOUNTER — Other Ambulatory Visit: Payer: Self-pay | Admitting: Neurosurgery

## 2019-11-04 NOTE — Progress Notes (Signed)
[facility] [place], [place] - [address] AT [place] [address] Phone: [phone] Fax: [place] [facility] - [place], [place] [address] Phone: [phone] Fax: [phone]      Your procedure is scheduled on December 16  Report to [facility] Main Entrance "A" at 1230 P.M., and check in at the Admitting office.  Call this number if you have problems the morning of surgery:  [phone]  Call [phone] if you have any questions prior to your surgery date Monday-Friday 8am-4pm    Remember:  Do not eat or drink after midnight the night before your surgery    Take these medicines the morning of surgery with A SIP OF WATER  acetaminophen (TYLENOL gabapentin (NEURONTIN) metoprolol succinate (TOPROL-XL)  Stop warfarin (COUMADIN) 5 Days prior to surgery, follow instructions about [place]  7 days prior to surgery STOP taking any Aspirin (unless otherwise instructed by your surgeon), Aleve, Naproxen, Ibuprofen, Motrin, Advil, Goody's, BC's, all herbal medications, fish oil, and all vitamins.   The Morning of Surgery  Do not wear jewelry  Do not wear lotions, powders, or colognes, or deodorant  Men may shave face and neck.  Do not bring valuables to the hospital.  [facility] is not responsible for any belongings or valuables.  If you are a smoker, DO NOT Smoke 24 hours prior to surgery  If you wear a CPAP at night please bring your mask, tubing, and machine the morning of surgery   Remember that you must have someone to transport you home after your surgery, and remain with you for 24 hours if you are discharged the same day.   Please bring cases for contacts, glasses, hearing aids, dentures or bridgework because it cannot be worn into surgery.    Leave your suitcase in the car.  After surgery it may be brought to  your room.  For patients admitted to the hospital, discharge time will be determined by your treatment team.  Patients discharged the day of surgery will not be allowed to drive home.    Special instructions:   [place]- Preparing For Surgery  Before surgery, you can play an important role. Because skin is not sterile, your skin needs to be as free of germs as possible. You can reduce the number of germs on your skin by washing with CHG (chlorahexidine gluconate) Soap before surgery.  CHG is an antiseptic cleaner which kills germs and bonds with the skin to continue killing germs even after washing.    Oral Hygiene is also important to reduce your risk of infection.  Remember - BRUSH YOUR TEETH THE MORNING OF SURGERY WITH YOUR REGULAR TOOTHPASTE  Please do not use if you have an allergy to CHG or antibacterial soaps. If your skin becomes reddened/irritated stop using the CHG.  Do not shave (including legs and underarms) for at least 48 hours prior to first CHG shower. It is OK to shave your face.  Please follow these instructions carefully.   1. Shower the NIGHT BEFORE SURGERY and the MORNING OF SURGERY with CHG Soap.   2. If you chose to wash your hair, wash your hair first as usual with your normal shampoo.  3. After you shampoo, rinse your hair and body thoroughly to remove the shampoo.  4. Use CHG as you would any other liquid  soap. You can apply CHG directly to the skin and wash gently with a scrungie or a clean washcloth.   5. Apply the CHG Soap to your body ONLY FROM THE NECK DOWN.  Do not use on open wounds or open sores. Avoid contact with your eyes, ears, mouth and genitals (private parts). Wash Face and genitals (private parts)  with your normal soap.   6. Wash thoroughly, paying special attention to the area where your surgery will be performed.  7. Thoroughly rinse your body with warm water from the neck down.  8. DO NOT shower/wash with your normal soap after using  and rinsing off the CHG Soap.  9. Pat yourself dry with a CLEAN TOWEL.  10. Wear CLEAN PAJAMAS to bed the night before surgery, wear comfortable clothes the morning of surgery  11. Place CLEAN SHEETS on your bed the night of your first shower and DO NOT SLEEP WITH PETS.    Day of Surgery:  Please shower the morning of surgery with the CHG soap Do not apply any deodorants/lotions. Please wear clean clothes to the hospital/surgery center.   Remember to brush your teeth WITH YOUR REGULAR TOOTHPASTE.   Please read over the following fact sheets that you were given.

## 2019-11-05 ENCOUNTER — Other Ambulatory Visit: Payer: Self-pay

## 2019-11-05 ENCOUNTER — Encounter (HOSPITAL_COMMUNITY): Payer: Self-pay

## 2019-11-05 ENCOUNTER — Ambulatory Visit (INDEPENDENT_AMBULATORY_CARE_PROVIDER_SITE_OTHER): Payer: Medicare HMO | Admitting: *Deleted

## 2019-11-05 ENCOUNTER — Encounter (HOSPITAL_COMMUNITY)
Admission: RE | Admit: 2019-11-05 | Discharge: 2019-11-05 | Disposition: A | Discharge status: Home / Self Care | Payer: Medicare HMO | Source: Ambulatory Visit | Attending: Neurosurgery | Admitting: Neurosurgery

## 2019-11-05 ENCOUNTER — Other Ambulatory Visit: Payer: Medicare HMO

## 2019-11-05 DIAGNOSIS — Z79899 Other long term (current) drug therapy: Secondary | ICD-10-CM | POA: Diagnosis not present

## 2019-11-05 DIAGNOSIS — Z86711 Personal history of pulmonary embolism: Secondary | ICD-10-CM | POA: Diagnosis not present

## 2019-11-05 DIAGNOSIS — E119 Type 2 diabetes mellitus without complications: Secondary | ICD-10-CM | POA: Insufficient documentation

## 2019-11-05 DIAGNOSIS — R600 Localized edema: Secondary | ICD-10-CM

## 2019-11-05 DIAGNOSIS — M48062 Spinal stenosis, lumbar region with neurogenic claudication: Secondary | ICD-10-CM | POA: Insufficient documentation

## 2019-11-05 DIAGNOSIS — Z87891 Personal history of nicotine dependence: Secondary | ICD-10-CM | POA: Diagnosis not present

## 2019-11-05 DIAGNOSIS — I10 Essential (primary) hypertension: Secondary | ICD-10-CM | POA: Diagnosis not present

## 2019-11-05 DIAGNOSIS — Z01812 Encounter for preprocedural laboratory examination: Secondary | ICD-10-CM | POA: Insufficient documentation

## 2019-11-05 DIAGNOSIS — Z7901 Long term (current) use of anticoagulants: Secondary | ICD-10-CM

## 2019-11-05 LAB — COMPREHENSIVE METABOLIC PANEL
ALT: 30 U/L (ref 0–44)
AST: 25 U/L (ref 15–41)
Albumin: 3.7 g/dL (ref 3.5–5.0)
Alkaline Phosphatase: 69 U/L (ref 38–126)
Anion gap: 9 (ref 5–15)
BUN: 11 mg/dL (ref 6–20)
CO2: 25 mmol/L (ref 22–32)
Calcium: 9.1 mg/dL (ref 8.9–10.3)
Chloride: 105 mmol/L (ref 98–111)
Creatinine, Ser: 0.65 mg/dL (ref 0.61–1.24)
GFR calc Af Amer: >60 mL/min (ref >60)
GFR calc non Af Amer: >60 mL/min (ref >60)
Glucose, Bld: 168 mg/dL — ABNORMAL HIGH (ref 70–99)
Potassium: 4.1 mmol/L (ref 3.5–5.1)
Sodium: 139 mmol/L (ref 135–145)
Total Bilirubin: 0.9 mg/dL (ref 0.3–1.2)
Total Protein: 7.3 g/dL (ref 6.5–8.1)

## 2019-11-05 LAB — PROTIME-INR
INR: 2.1 — ABNORMAL HIGH (ref 0.8–1.2)
Prothrombin Time: 23.3 seconds — ABNORMAL HIGH (ref 11.4–15.2)

## 2019-11-05 LAB — CBC
HCT: 48 % (ref 39.0–52.0)
Hemoglobin: 15.5 g/dL (ref 13.0–17.0)
MCH: 29.7 pg (ref 26.0–34.0)
MCHC: 32.3 g/dL (ref 30.0–36.0)
MCV: 92 fL (ref 80.0–100.0)
Platelets: 208 10*3/uL (ref 150–400)
RBC: 5.22 MIL/uL (ref 4.22–5.81)
RDW: 13.8 % (ref 11.5–15.5)
WBC: 8.2 10*3/uL (ref 4.0–10.5)
nRBC: 0 % (ref 0.0–0.2)

## 2019-11-05 LAB — SURGICAL PCR SCREEN
MRSA, PCR: NEGATIVE
Staphylococcus aureus: NEGATIVE

## 2019-11-05 LAB — POCT INR: INR: 2.4 (ref 2.0–3.0)

## 2019-11-05 MED ORDER — ENOXAPARIN SODIUM 150 MG/ML ~~LOC~~ SOLN: 150.0000 mg | Freq: Two times a day (BID) | SUBCUTANEOUS | 1 refills | Status: DC

## 2019-11-05 NOTE — Progress Notes (Addendum)
PCP - [name] Cardiologist - [place]  PPM/ICD - denies  Device Orders -  Rep Notified -   Chest x-ray - n/a EKG - 04/2019 Stress Test - none ECHO - 04/2014 Cardiac Cath - none  Sleep Study - several yrs. Ago, reports he returned unit because it was too difficult to use with his pain & limited mobility CPAP - doesn't use  Fasting Blood Sugar - denies Checks Blood Sugar _____ times a day  Blood Thinner Instructions:on Coumadin, will be bridging to Lovenox - going to clinic appt. Today for INR Aspirin Instructions:not taking   ERAS Protcol -n/a PRE-SURGERY Ensure or G2-   COVID TEST- scheduled for 12/14   Anesthesia review: yes- for multiple hx of PE, DVT's, w IVC filter  Patient denies shortness of breath, fever, cough and chest pain at PAT appointment   All instructions explained to the patient, with a verbal understanding of the material. Patient agrees to go over the instructions while at home for a better understanding. Patient also instructed to self quarantine after being tested for COVID-19. The opportunity to ask questions was provided.

## 2019-11-05 NOTE — Patient Instructions (Addendum)
Description   Please follow instructions for upcoming procedure. Continue taking 1 tablet daily except 1/2 tablet on Mondays, Wednesdays and Fridays. Recheck INR in 1 week after procedure or hospital discharge. Call us with any changes # [phone].     11/05/2019: Last dose of Coumadin.  11/06/2019: No Coumadin or Lovenox.  11/07/2019: Inject Lovenox 150mg  in the fatty abdominal tissue at least 2 inches from the belly button twice a day about 12 hours apart, 8am and 8pm rotate sites. No Coumadin.  11/08/2019: Inject Lovenox in the fatty tissue every 12 hours, 8am and 8pm. No Coumadin.  11/09/2019: Inject Lovenox in the fatty tissue every 12 hours, 8am and 8pm. No Coumadin.  11/10/2019: Inject Lovenox in the fatty tissue in the morning at 8 am (No PM dose). No Coumadin.  11/11/2019: Procedure Day - No Lovenox - Resume Coumadin in the evening or as directed by doctor   11/12/2019: Resume Lovenox inject in the fatty tissue every 12 hours and take Coumadin.  11/13/2019: Inject Lovenox in the fatty tissue every 12 hours and take Coumadin.  11/14/2019: Inject Lovenox in the fatty tissue every 12 hours and take Coumadin.  11/15/2019: Inject Lovenox in the fatty tissue every 12 hours and take Coumadin.  11/16/2019: Inject Lovenox in the fatty tissue every 12 hours and take Coumadin.  11/17/2019: Inject Lovenox in the fatty tissue at 8am and report to Coumadin appt to check INR.

## 2019-11-06 ENCOUNTER — Other Ambulatory Visit: Payer: Self-pay | Admitting: Cardiovascular Disease

## 2019-11-06 NOTE — Progress Notes (Signed)
Anesthesia Chart Review:  Case: [id number] Date/Time: 11/11/19 1413   Procedure: REDO LUMBAR 3- LUMBAR 4 LAMINECTOMY/FORAMINOTOMY (N/A ) - REDO LUMBAR 3- LUMBAR 4 LAMINECTOMY/FORAMINOTOMY   Anesthesia type: General   Pre-op diagnosis: SPINAL STENOSIS, LUMBAR REGION WITH NEUROGENIC CLAUDICATION   Location: [place] OR ROOM 20 / [place] OR   Surgeons: [name], MD      DISCUSSION: Patient is a 53 year old male scheduled for the above procedure. S/p T5-6 laminectomy, T11-12 laminectomy, and L3-4 laminectomy on 05/14/19.   History includes former smoker (quit 2002), HTN, DM2 (diet controlled), DVT/PE (PE and RLE DVT 10/16/03; reported PE 2008 and 2010; likely chronic RLE DVT 04/21/09; LLE DVT 06/2012; BLE DVT 01/04/14; IVC filter 11/08/2011 prior to back surgery), back surgeries (reported ~ 8 back surgeries including T2-4 and T9-11 laminectomy 09/14/03; T6-12 posterior decompressive laminectomy 11/2011; T6-8 laminectomies 01/07/14; T5-6, T11-12, L3-4 laminectomies 05/14/19), neck surgeries (C6 corpectomy/C5-7 fusion 08/10/04; C4 corpectomy, C3-5 interbody arthrodesis/anterior plating 01/25/09), drug OD (amitriptyline 10/21/08, requiring ventilator support), OSA (mild OSA, strong suspicion for obesity hypoventilation syndrome 03/2014 sleep study; he does not use CPAP). He reported that he temporarily required home oxygen with PE ~ 2005, but has not required supplemental O2 in years.  BMI is consistent with morbid obesity.  Last evaluation at [facility] was on 10/28/19. Preoperative cardiology input by Kerin Ransom, PA-C on 11/02/19: "Given past medical history and time since last visit, based on ACC/AHA guidelines, Nicolas [name] would be at acceptable risk for the planned procedure without further cardiovascular testing.   The patient will hold Coumadin 5 days pre op but will require Lovenox bridging which will be arranged by our pharmacist." (Anticoagulation dose instructions outlined by Derrel Nip, RN on  11/05/19.)  Preoperative labs noted. It does not appear that a repeat A1c was done at PAT. On 05/11/19, A1c was 7.4%. Glucose was 168 with PAT labs. Consider getting A1c during admission. He does need a repeat PT/INR on the day of surgery.  Presurgical Covid test is scheduled for 11/09/2019.   VS: BP (!) 146/75   Pulse 84   Temp 37 C (Oral)   Resp 18   Ht 5\' 10"  (1.778 m)   Wt (!) 140.6 kg   SpO2 99%   BMI 44.48 kg/m     PROVIDERS: Shawnee Knapp, MD is PCP. Last visit Delia Chimes, MD on 01/12/19 for probably lipoma right breast with referral to general surgery.  Lauree Chandler, MD is cardiologist. Last evaluation 10/28/19 with Reece Leader, PA-C for follow-up HTN, chronic edema, and recurrent PE on warfarin and followed at that the [facility].   LABS: Labs reviewed: Acceptable for surgery. However, will need repeat PT/INR on the day of surgery.  (all labs ordered are listed, but only abnormal results are displayed)  Labs Reviewed  PROTIME-INR - Abnormal; Notable for the following components:      Result Value   Prothrombin Time 23.3 (*)    INR 2.1 (*)    All other components within normal limits  COMPREHENSIVE METABOLIC PANEL - Abnormal; Notable for the following components:   Glucose, Bld 168 (*)    All other components within normal limits  SURGICAL PCR SCREEN  CBC     IMAGES: MRI L-spine 10/26/19: IMPRESSION: 1. Image quality degraded by motion and large patient size. 2. Bilateral laminectomy L2-3 with mild spinal stenosis. Moderate subarticular stenosis bilaterally. 3. Severe spinal stenosis L3-4 with severe subarticular and foraminal stenosis bilaterally. Probable synovial cyst on  the right contributes to stenosis. 4. Remote laminectomy L4-5. 5. Moderate subarticular stenosis bilaterally L5-S1.   EKG: 05/11/19:  Normal sinus rhythm with sinus arrhythmia Normal ECG Since last tracing rate slower Confirmed by Larae Grooms  [phone]) on 05/13/2019 3:16:49 PM   CV: Echo 05/23/14: Study Conclusions - Left ventricle: The cavity size was normal. Wall thickness was normal. Systolic function was normal. The estimated ejection fraction was in the range of 55% to 60%. Features are consistent with a pseudonormal left ventricular filling pattern, with concomitant abnormal relaxation and increased filling pressure (grade 2 diastolic dysfunction). - Impressions: Technically limited study due to poor sound wave transmission. Impressions: - Technically limited study due to poor sound wave transmission.    Past Medical History:  Diagnosis Date  . Arthritis   . Clotting disorder ([place])    pulmonary embolus, DVT  . Depression   . Diabetes mellitus without complication (HCC)    no meds, watching diet  . DVT (deep venous thrombosis) ([place])    Summer 2012  . Hypertension   . Neuromuscular disorder ([place])    spinal stenosis.- pt walks with a walker short distance  . Peripheral vascular disease ([place])   . PONV (postoperative nausea and vomiting)   . Pulmonary embolism ([place])    x 3 in 2005, 2008, 2010  . Sleep apnea    does not wear a c-pap, pt. reports that he gave the CPAP back, denies that he has OSA   . Spinal stenosis     Past Surgical History:  Procedure Laterality Date  . ANTERIOR CERVICAL CORPECTOMY N/A 01/26/2019   Procedure: Cervical Four Corpectomy with Cervical Three-Four, Cervical Four-Five interbody fusion, prosthesis, explore old fusion, possible removal of old plate;  Surgeon: [name], MD;  Location: [place];  Service: Neurosurgery;  Laterality: N/A;  anterior approach  . CHOLECYSTECTOMY N/A 05/24/2014   Procedure: LAPAROSCOPIC CHOLECYSTECTOMY;  Surgeon: Zenovia Jarred, MD;  Location: [place];  Service: General;  Laterality: N/A;  . ERCP N/A 05/25/2014   Procedure: ENDOSCOPIC RETROGRADE CHOLANGIOPANCREATOGRAPHY (ERCP);  Surgeon: Beryle Beams, MD;  Location: [place];  Service: Endoscopy;   Laterality: N/A;  . ERCP N/A 07/16/2014   Procedure: ENDOSCOPIC RETROGRADE CHOLANGIOPANCREATOGRAPHY (ERCP);  Surgeon: Beryle Beams, MD;  Location: [facility];  Service: Endoscopy;  Laterality: N/A;  . ivp filter  jan 2012  . LUMBAR LAMINECTOMY/DECOMPRESSION MICRODISCECTOMY N/A 05/14/2019   Procedure: LAMINECTOMY AND FORAMINOTOMY THORACIC FIVE- THORACIC SIX, THORACIC ELEVEN- THORACIC THORACIC, LUMBAR THREE- LUMBAR FOUR;  Surgeon: [name], MD;  Location: [place];  Service: Neurosurgery;  Laterality: N/A;  posterior  . SPINE SURGERY  11/30/11   Total 6 back surgeries  . SPINE SURGERY  [id number]   Dr. Cathren Laine in [place], [place]  . [place]     08/2003, cervial spine 2005, 2010 lower back, 2011 lower back, 2012 & 2013  . UPPER GASTROINTESTINAL ENDOSCOPY    . WISDOM TOOTH EXTRACTION     2 removed    MEDICATIONS: . acetaminophen (TYLENOL) 325 MG tablet  . baclofen (LIORESAL) 10 MG tablet  . enoxaparin (LOVENOX) 150 MG/ML injection  . furosemide (LASIX) 40 MG tablet  . gabapentin (NEURONTIN) 300 MG capsule  . losartan (COZAAR) 50 MG tablet  . metoprolol succinate (TOPROL-XL) 50 MG 24 hr tablet  . Multiple Vitamin (MULTIVITAMIN WITH MINERALS) TABS tablet  . potassium chloride SA (K-DUR) 20 MEQ tablet  . warfarin (COUMADIN) 10 MG tablet   No current facility-administered medications for this encounter.  Myra Gianotti, PA-C Surgical Short Stay/Anesthesiology [facility] Phone [phone] [place] [place] Phone [phone] 11/06/2019 4:50 PM

## 2019-11-06 NOTE — Anesthesia Preprocedure Evaluation (Addendum)
Anesthesia Evaluation  Patient identified by MRN, date of birth, ID band Patient awake    Reviewed: Allergy & Precautions, NPO status , Patient's Chart, lab work & pertinent test results  History of Anesthesia Complications (+) PONV  Airway Mallampati: II  TM Distance: >3 FB     Dental  (+) Dental Advisory Given   Pulmonary sleep apnea , former smoker,    breath sounds clear to auscultation       Cardiovascular hypertension, Pt. on medications and Pt. on home beta blockers + Peripheral Vascular Disease and + DVT   Rhythm:Regular Rate:Normal  EKG:05/11/19:  Normal sinus rhythm with sinus arrhythmia Normal ECG Since last tracing rate slower Confirmed by Larae Grooms [phone]) on 05/13/2019 3:16:49 PM   CV: Echo 05/23/14: Study Conclusions - Left ventricle: The cavity size was normal. Wall thickness was normal. Systolic function was normal. The estimated ejection fraction was in the range of 55% to 60%. Features are consistent with a pseudonormal left ventricular filling pattern, with concomitant abnormal relaxation and increased filling pressure (grade 2 diastolic dysfunction). - Impressions: Technically limited study due to poor sound wave transmission. Impressions: - Technically limited study due to poor sound wave transmission.    Neuro/Psych  Neuromuscular disease    GI/Hepatic negative GI ROS, Neg liver ROS,   Endo/Other  diabetes, Type 2  Renal/GU negative Renal ROS     Musculoskeletal  (+) Arthritis ,   Abdominal   Peds  Hematology  (+) Blood dyscrasia, ,   Anesthesia Other Findings   Reproductive/Obstetrics                            Lab Results  Component Value Date   WBC 8.2 11/05/2019   HGB 15.5 11/05/2019   HCT 48.0 11/05/2019   MCV 92.0 11/05/2019   PLT 208 11/05/2019   Lab Results  Component Value Date   CREATININE 0.65 11/05/2019   BUN 11  11/05/2019   NA 139 11/05/2019   K 4.1 11/05/2019   CL 105 11/05/2019   CO2 25 11/05/2019   Lab Results  Component Value Date   INR 0.9 11/11/2019   INR 2.4 11/05/2019   INR 2.1 (H) 11/05/2019    Anesthesia Physical Anesthesia Plan  ASA: III  Anesthesia Plan: General   Post-op Pain Management:    Induction: Intravenous  PONV Risk Score and Plan: 3 and Dexamethasone, Ondansetron, Treatment may vary due to age or medical condition and Midazolam  Airway Management Planned: Oral ETT  Additional Equipment:   Intra-op Plan:   Post-operative Plan: Extubation in OR  Informed Consent: I have reviewed the patients History and Physical, chart, labs and discussed the procedure including the risks, benefits and alternatives for the proposed anesthesia with the patient or authorized representative who has indicated his/her understanding and acceptance.     Dental advisory given  Plan Discussed with: CRNA  Anesthesia Plan Comments: (. )       Anesthesia Quick Evaluation

## 2019-11-09 ENCOUNTER — Other Ambulatory Visit (HOSPITAL_COMMUNITY)
Admission: RE | Admit: 2019-11-09 | Discharge: 2019-11-09 | Disposition: A | Discharge status: Home / Self Care | Payer: Medicare HMO | Source: Ambulatory Visit | Attending: Neurosurgery | Admitting: Neurosurgery

## 2019-11-09 DIAGNOSIS — Z20828 Contact with and (suspected) exposure to other viral communicable diseases: Secondary | ICD-10-CM | POA: Insufficient documentation

## 2019-11-09 DIAGNOSIS — Z01812 Encounter for preprocedural laboratory examination: Secondary | ICD-10-CM | POA: Insufficient documentation

## 2019-11-09 LAB — SARS CORONAVIRUS 2 (TAT 6-24 HRS): SARS Coronavirus 2: NEGATIVE

## 2019-11-10 MED ORDER — DEXTROSE 5 % IV SOLN
3.0000 g | INTRAVENOUS | Status: AC
  Administered 2019-11-11: 15:00:00 3 g via INTRAVENOUS
  Filled 2019-11-10: qty 3

## 2019-11-11 ENCOUNTER — Ambulatory Visit (HOSPITAL_COMMUNITY): Payer: Medicare HMO

## 2019-11-11 ENCOUNTER — Ambulatory Visit (HOSPITAL_COMMUNITY): Payer: Medicare HMO | Admitting: Vascular Surgery

## 2019-11-11 ENCOUNTER — Inpatient Hospital Stay (HOSPITAL_COMMUNITY)
Admission: RE | Admit: 2019-11-11 | Discharge: 2019-11-16 | DRG: 519 | Disposition: A | Discharge status: Home Health | Payer: Medicare HMO | Attending: Neurosurgery | Admitting: Neurosurgery

## 2019-11-11 ENCOUNTER — Other Ambulatory Visit: Payer: Self-pay

## 2019-11-11 ENCOUNTER — Inpatient Hospital Stay (HOSPITAL_COMMUNITY)
Admission: RE | Disposition: A | Discharge status: Home Health | Payer: Self-pay | Source: Home / Self Care | Attending: Neurosurgery

## 2019-11-11 ENCOUNTER — Encounter (HOSPITAL_COMMUNITY): Payer: Self-pay | Admitting: Neurosurgery

## 2019-11-11 DIAGNOSIS — Z79899 Other long term (current) drug therapy: Secondary | ICD-10-CM | POA: Diagnosis not present

## 2019-11-11 DIAGNOSIS — Z20828 Contact with and (suspected) exposure to other viral communicable diseases: Secondary | ICD-10-CM | POA: Diagnosis present

## 2019-11-11 DIAGNOSIS — M5116 Intervertebral disc disorders with radiculopathy, lumbar region: Secondary | ICD-10-CM | POA: Diagnosis present

## 2019-11-11 DIAGNOSIS — M5106 Intervertebral disc disorders with myelopathy, lumbar region: Secondary | ICD-10-CM | POA: Diagnosis present

## 2019-11-11 DIAGNOSIS — Z981 Arthrodesis status: Secondary | ICD-10-CM | POA: Diagnosis not present

## 2019-11-11 DIAGNOSIS — M545 Low back pain: Secondary | ICD-10-CM | POA: Diagnosis present

## 2019-11-11 DIAGNOSIS — G8220 Paraplegia, unspecified: Secondary | ICD-10-CM | POA: Diagnosis present

## 2019-11-11 DIAGNOSIS — E669 Obesity, unspecified: Secondary | ICD-10-CM | POA: Diagnosis present

## 2019-11-11 DIAGNOSIS — Z833 Family history of diabetes mellitus: Secondary | ICD-10-CM | POA: Diagnosis not present

## 2019-11-11 DIAGNOSIS — M48062 Spinal stenosis, lumbar region with neurogenic claudication: Secondary | ICD-10-CM | POA: Diagnosis present

## 2019-11-11 DIAGNOSIS — I10 Essential (primary) hypertension: Secondary | ICD-10-CM | POA: Diagnosis present

## 2019-11-11 DIAGNOSIS — Z87891 Personal history of nicotine dependence: Secondary | ICD-10-CM | POA: Diagnosis not present

## 2019-11-11 DIAGNOSIS — Z86718 Personal history of other venous thrombosis and embolism: Secondary | ICD-10-CM | POA: Diagnosis not present

## 2019-11-11 DIAGNOSIS — Z6841 Body Mass Index (BMI) 40.0 and over, adult: Secondary | ICD-10-CM

## 2019-11-11 DIAGNOSIS — G4730 Sleep apnea, unspecified: Secondary | ICD-10-CM | POA: Diagnosis not present

## 2019-11-11 DIAGNOSIS — E119 Type 2 diabetes mellitus without complications: Secondary | ICD-10-CM | POA: Diagnosis not present

## 2019-11-11 DIAGNOSIS — Z86711 Personal history of pulmonary embolism: Secondary | ICD-10-CM

## 2019-11-11 DIAGNOSIS — Z7901 Long term (current) use of anticoagulants: Secondary | ICD-10-CM

## 2019-11-11 DIAGNOSIS — E1151 Type 2 diabetes mellitus with diabetic peripheral angiopathy without gangrene: Secondary | ICD-10-CM | POA: Diagnosis present

## 2019-11-11 DIAGNOSIS — Z419 Encounter for procedure for purposes other than remedying health state, unspecified: Secondary | ICD-10-CM

## 2019-11-11 HISTORY — PX: LUMBAR LAMINECTOMY/DECOMPRESSION MICRODISCECTOMY: SHX5026

## 2019-11-11 LAB — GLUCOSE, CAPILLARY
Glucose-Capillary: 176 mg/dL — ABNORMAL HIGH (ref 70–99)
Glucose-Capillary: 165 mg/dL — ABNORMAL HIGH (ref 70–99)
Glucose-Capillary: 139 mg/dL — ABNORMAL HIGH (ref 70–99)
Glucose-Capillary: 176 mg/dL — ABNORMAL HIGH (ref 70–99)

## 2019-11-11 LAB — PROTIME-INR
INR: 0.9 (ref 0.8–1.2)
Prothrombin Time: 11.9 seconds (ref 11.4–15.2)

## 2019-11-11 LAB — HEMOGLOBIN A1C
Hgb A1c MFr Bld: 9.4 % — ABNORMAL HIGH (ref 4.8–5.6)
Mean Plasma Glucose: 223.08 mg/dL

## 2019-11-11 SURGERY — LUMBAR LAMINECTOMY/DECOMPRESSION MICRODISCECTOMY 1 LEVEL
Anesthesia: General | Site: Back

## 2019-11-11 MED ORDER — ACETAMINOPHEN 500 MG PO TABS
1000.0000 mg | ORAL_TABLET | Freq: Four times a day (QID) | ORAL | Status: AC
  Administered 2019-11-11 – 2019-11-12 (×3): 1000 mg via ORAL
  Filled 2019-11-11 (×5): qty 2

## 2019-11-11 MED ORDER — FUROSEMIDE 40 MG PO TABS
40.0000 mg | ORAL_TABLET | Freq: Every day | ORAL | Status: DC
  Administered 2019-11-11 – 2019-11-16 (×6): 40 mg via ORAL
  Filled 2019-11-11 (×6): qty 1

## 2019-11-11 MED ORDER — PHENYLEPHRINE 40 MCG/ML (10ML) SYRINGE FOR IV PUSH (FOR BLOOD PRESSURE SUPPORT)
PREFILLED_SYRINGE | INTRAVENOUS | Status: DC | PRN
  Administered 2019-11-11 (×4): 80 ug via INTRAVENOUS

## 2019-11-11 MED ORDER — ROCURONIUM BROMIDE 10 MG/ML (PF) SYRINGE
PREFILLED_SYRINGE | INTRAVENOUS | Status: AC
  Filled 2019-11-11: qty 20

## 2019-11-11 MED ORDER — KETAMINE HCL 50 MG/5ML IJ SOSY
PREFILLED_SYRINGE | INTRAMUSCULAR | Status: AC
  Filled 2019-11-11: qty 5

## 2019-11-11 MED ORDER — MIDAZOLAM HCL 2 MG/2ML IJ SOLN
INTRAMUSCULAR | Status: AC
  Filled 2019-11-11: qty 2

## 2019-11-11 MED ORDER — PROPOFOL 10 MG/ML IV BOLUS
INTRAVENOUS | Status: DC | PRN
  Administered 2019-11-11: 200 mg via INTRAVENOUS

## 2019-11-11 MED ORDER — BUPIVACAINE-EPINEPHRINE 0.5% -1:200000 IJ SOLN
INTRAMUSCULAR | Status: AC
  Filled 2019-11-11: qty 1

## 2019-11-11 MED ORDER — BACITRACIN ZINC 500 UNIT/GM EX OINT
TOPICAL_OINTMENT | CUTANEOUS | Status: DC | PRN
  Administered 2019-11-11: 1 via TOPICAL

## 2019-11-11 MED ORDER — SCOPOLAMINE 1 MG/3DAYS TD PT72
MEDICATED_PATCH | TRANSDERMAL | Status: DC | PRN
  Administered 2019-11-11: 1 via TRANSDERMAL

## 2019-11-11 MED ORDER — DEXAMETHASONE SODIUM PHOSPHATE 10 MG/ML IJ SOLN
INTRAMUSCULAR | Status: AC
  Filled 2019-11-11: qty 1

## 2019-11-11 MED ORDER — BISACODYL 10 MG RE SUPP: 10.0000 mg | Freq: Every day | RECTAL | Status: DC | PRN

## 2019-11-11 MED ORDER — SODIUM CHLORIDE 0.9% FLUSH: 3.0000 mL | INTRAVENOUS | Status: DC | PRN

## 2019-11-11 MED ORDER — ACETAMINOPHEN 325 MG PO TABS: 650.0000 mg | ORAL_TABLET | ORAL | Status: DC | PRN

## 2019-11-11 MED ORDER — CYCLOBENZAPRINE HCL 10 MG PO TABS
10.0000 mg | ORAL_TABLET | Freq: Three times a day (TID) | ORAL | Status: DC | PRN
  Administered 2019-11-11 – 2019-11-16 (×2): 10 mg via ORAL
  Filled 2019-11-11 (×2): qty 1

## 2019-11-11 MED ORDER — ONDANSETRON HCL 4 MG/2ML IJ SOLN
INTRAMUSCULAR | Status: DC | PRN
  Administered 2019-11-11 (×2): 4 mg via INTRAVENOUS

## 2019-11-11 MED ORDER — DEXTROSE 5 % IV SOLN
3.0000 g | Freq: Three times a day (TID) | INTRAVENOUS | Status: AC
  Administered 2019-11-11 – 2019-11-12 (×2): 3 g via INTRAVENOUS
  Filled 2019-11-11 (×4): qty 3000

## 2019-11-11 MED ORDER — MIDAZOLAM HCL 5 MG/5ML IJ SOLN
INTRAMUSCULAR | Status: DC | PRN
  Administered 2019-11-11: 2 mg via INTRAVENOUS

## 2019-11-11 MED ORDER — THROMBIN 5000 UNITS EX SOLR
CUTANEOUS | Status: AC
  Filled 2019-11-11: qty 5000

## 2019-11-11 MED ORDER — BACITRACIN ZINC 500 UNIT/GM EX OINT
TOPICAL_OINTMENT | CUTANEOUS | Status: AC
  Filled 2019-11-11: qty 28.35

## 2019-11-11 MED ORDER — OXYCODONE HCL 5 MG PO TABS
10.0000 mg | ORAL_TABLET | ORAL | Status: DC | PRN
  Administered 2019-11-12 – 2019-11-16 (×14): 10 mg via ORAL
  Filled 2019-11-11 (×14): qty 2

## 2019-11-11 MED ORDER — LIDOCAINE 2% (20 MG/ML) 5 ML SYRINGE
INTRAMUSCULAR | Status: AC
  Filled 2019-11-11: qty 5

## 2019-11-11 MED ORDER — OXYCODONE HCL 5 MG PO TABS
5.0000 mg | ORAL_TABLET | ORAL | Status: DC | PRN
  Administered 2019-11-11: 5 mg via ORAL
  Filled 2019-11-11: qty 1

## 2019-11-11 MED ORDER — ACETAMINOPHEN 650 MG RE SUPP: 650.0000 mg | RECTAL | Status: DC | PRN

## 2019-11-11 MED ORDER — CYCLOBENZAPRINE HCL 10 MG PO TABS
ORAL_TABLET | ORAL | Status: AC
  Administered 2019-11-11: 10 mg via ORAL
  Filled 2019-11-11: qty 1

## 2019-11-11 MED ORDER — SUGAMMADEX SODIUM 500 MG/5ML IV SOLN
INTRAVENOUS | Status: DC | PRN
  Administered 2019-11-11: 300 mg via INTRAVENOUS

## 2019-11-11 MED ORDER — CHLORHEXIDINE GLUCONATE CLOTH 2 % EX PADS: 6.0000 | MEDICATED_PAD | Freq: Once | CUTANEOUS | Status: DC

## 2019-11-11 MED ORDER — POTASSIUM CHLORIDE CRYS ER 20 MEQ PO TBCR
20.0000 meq | EXTENDED_RELEASE_TABLET | Freq: Every day | ORAL | Status: DC
  Administered 2019-11-11 – 2019-11-16 (×6): 20 meq via ORAL
  Filled 2019-11-11 (×6): qty 1

## 2019-11-11 MED ORDER — LOSARTAN POTASSIUM 50 MG PO TABS
50.0000 mg | ORAL_TABLET | Freq: Every day | ORAL | Status: DC
  Administered 2019-11-11 – 2019-11-16 (×6): 50 mg via ORAL
  Filled 2019-11-11 (×6): qty 1

## 2019-11-11 MED ORDER — DEXAMETHASONE SODIUM PHOSPHATE 10 MG/ML IJ SOLN
INTRAMUSCULAR | Status: DC | PRN
  Administered 2019-11-11: 5 mg via INTRAVENOUS

## 2019-11-11 MED ORDER — SUCCINYLCHOLINE CHLORIDE 20 MG/ML IJ SOLN
INTRAMUSCULAR | Status: DC | PRN
  Administered 2019-11-11: 100 mg via INTRAVENOUS

## 2019-11-11 MED ORDER — ROCURONIUM BROMIDE 100 MG/10ML IV SOLN
INTRAVENOUS | Status: DC | PRN
  Administered 2019-11-11 (×5): 10 mg via INTRAVENOUS
  Administered 2019-11-11: 50 mg via INTRAVENOUS

## 2019-11-11 MED ORDER — MENTHOL 3 MG MT LOZG: 1.0000 | LOZENGE | OROMUCOSAL | Status: DC | PRN

## 2019-11-11 MED ORDER — LACTATED RINGERS IV SOLN: INTRAVENOUS | Status: DC

## 2019-11-11 MED ORDER — 0.9 % SODIUM CHLORIDE (POUR BTL) OPTIME
TOPICAL | Status: DC | PRN
  Administered 2019-11-11: 1000 mL

## 2019-11-11 MED ORDER — PHENOL 1.4 % MT LIQD: 1.0000 | OROMUCOSAL | Status: DC | PRN

## 2019-11-11 MED ORDER — FENTANYL CITRATE (PF) 250 MCG/5ML IJ SOLN
INTRAMUSCULAR | Status: DC | PRN
  Administered 2019-11-11: 50 ug via INTRAVENOUS
  Administered 2019-11-11: 25 ug via INTRAVENOUS
  Administered 2019-11-11 (×2): 50 ug via INTRAVENOUS
  Administered 2019-11-11: 100 ug via INTRAVENOUS
  Administered 2019-11-11 (×2): 50 ug via INTRAVENOUS
  Administered 2019-11-11: 25 ug via INTRAVENOUS
  Administered 2019-11-11 (×2): 50 ug via INTRAVENOUS

## 2019-11-11 MED ORDER — EPHEDRINE 5 MG/ML INJ
INTRAVENOUS | Status: AC
  Filled 2019-11-11: qty 10

## 2019-11-11 MED ORDER — ONDANSETRON HCL 4 MG/2ML IJ SOLN: 4.0000 mg | Freq: Four times a day (QID) | INTRAMUSCULAR | Status: DC | PRN

## 2019-11-11 MED ORDER — SODIUM CHLORIDE 0.9 % IV SOLN
250.0000 mL | INTRAVENOUS | Status: DC
  Administered 2019-11-11: 250 mL via INTRAVENOUS

## 2019-11-11 MED ORDER — BACLOFEN 10 MG PO TABS
5.0000 mg | ORAL_TABLET | Freq: Every day | ORAL | Status: DC
  Administered 2019-11-11 – 2019-11-15 (×5): 5 mg via ORAL
  Filled 2019-11-11 (×5): qty 1

## 2019-11-11 MED ORDER — PHENYLEPHRINE HCL-NACL 10-0.9 MG/250ML-% IV SOLN
INTRAVENOUS | Status: DC | PRN
  Administered 2019-11-11: 30 ug/min via INTRAVENOUS
  Administered 2019-11-11: 50 ug/min via INTRAVENOUS

## 2019-11-11 MED ORDER — METOPROLOL SUCCINATE ER 25 MG PO TB24
75.0000 mg | ORAL_TABLET | Freq: Every day | ORAL | Status: DC
  Administered 2019-11-12 – 2019-11-16 (×5): 75 mg via ORAL
  Filled 2019-11-11 (×5): qty 3

## 2019-11-11 MED ORDER — METOPROLOL SUCCINATE ER 50 MG PO TB24: 75.0000 mg | ORAL_TABLET | Freq: Once | ORAL | Status: DC

## 2019-11-11 MED ORDER — METOPROLOL SUCCINATE ER 25 MG PO TB24
75.0000 mg | ORAL_TABLET | Freq: Once | ORAL | Status: AC
  Administered 2019-11-11: 11:00:00 75 mg via ORAL

## 2019-11-11 MED ORDER — KETAMINE HCL 10 MG/ML IJ SOLN
INTRAMUSCULAR | Status: DC | PRN
  Administered 2019-11-11: 50 mg via INTRAVENOUS
  Administered 2019-11-11: 10 mg via INTRAVENOUS

## 2019-11-11 MED ORDER — THROMBIN 5000 UNITS EX SOLR: OROMUCOSAL | Status: DC | PRN

## 2019-11-11 MED ORDER — ONDANSETRON HCL 4 MG PO TABS: 4.0000 mg | ORAL_TABLET | Freq: Four times a day (QID) | ORAL | Status: DC | PRN

## 2019-11-11 MED ORDER — SODIUM CHLORIDE 0.9 % IV SOLN: INTRAVENOUS | Status: DC | PRN

## 2019-11-11 MED ORDER — DOCUSATE SODIUM 100 MG PO CAPS
100.0000 mg | ORAL_CAPSULE | Freq: Two times a day (BID) | ORAL | Status: DC
  Administered 2019-11-11 – 2019-11-16 (×10): 100 mg via ORAL
  Filled 2019-11-11 (×11): qty 1

## 2019-11-11 MED ORDER — FENTANYL CITRATE (PF) 250 MCG/5ML IJ SOLN
INTRAMUSCULAR | Status: AC
  Filled 2019-11-11: qty 5

## 2019-11-11 MED ORDER — LIDOCAINE 2% (20 MG/ML) 5 ML SYRINGE
INTRAMUSCULAR | Status: DC | PRN
  Administered 2019-11-11: 60 mg via INTRAVENOUS

## 2019-11-11 MED ORDER — PROPOFOL 10 MG/ML IV BOLUS
INTRAVENOUS | Status: AC
  Filled 2019-11-11: qty 20

## 2019-11-11 MED ORDER — ACETAMINOPHEN 325 MG PO TABS
650.0000 mg | ORAL_TABLET | ORAL | Status: DC | PRN
  Administered 2019-11-13 – 2019-11-14 (×2): 650 mg via ORAL
  Filled 2019-11-11 (×2): qty 2

## 2019-11-11 MED ORDER — SUCCINYLCHOLINE CHLORIDE 200 MG/10ML IV SOSY
PREFILLED_SYRINGE | INTRAVENOUS | Status: AC
  Filled 2019-11-11: qty 30

## 2019-11-11 MED ORDER — SODIUM CHLORIDE 0.9% FLUSH
3.0000 mL | Freq: Two times a day (BID) | INTRAVENOUS | Status: DC
  Administered 2019-11-12 – 2019-11-13 (×3): 3 mL via INTRAVENOUS

## 2019-11-11 MED ORDER — MORPHINE SULFATE (PF) 4 MG/ML IV SOLN: 4.0000 mg | INTRAVENOUS | Status: DC | PRN

## 2019-11-11 MED ORDER — ONDANSETRON HCL 4 MG/2ML IJ SOLN
INTRAMUSCULAR | Status: AC
  Filled 2019-11-11: qty 2

## 2019-11-11 MED ORDER — OXYCODONE HCL 5 MG PO TABS
ORAL_TABLET | ORAL | Status: AC
  Administered 2019-11-11: 18:00:00 10 mg via ORAL
  Filled 2019-11-11: qty 2

## 2019-11-11 MED ORDER — ACETAMINOPHEN 10 MG/ML IV SOLN
INTRAVENOUS | Status: AC
  Filled 2019-11-11: qty 100

## 2019-11-11 MED ORDER — BUPIVACAINE-EPINEPHRINE (PF) 0.5% -1:200000 IJ SOLN
INTRAMUSCULAR | Status: DC | PRN
  Administered 2019-11-11: 10 mL

## 2019-11-11 MED ORDER — ACETAMINOPHEN 10 MG/ML IV SOLN
INTRAVENOUS | Status: DC | PRN
  Administered 2019-11-11: 1000 mg via INTRAVENOUS

## 2019-11-11 MED ORDER — METOPROLOL SUCCINATE ER 25 MG PO TB24
ORAL_TABLET | ORAL | Status: AC
  Filled 2019-11-11: qty 3

## 2019-11-11 MED ORDER — ONDANSETRON HCL 4 MG/2ML IJ SOLN
INTRAMUSCULAR | Status: AC
  Administered 2019-11-11: 19:00:00 4 mg
  Filled 2019-11-11: qty 2

## 2019-11-11 MED ORDER — GABAPENTIN 300 MG PO CAPS
300.0000 mg | ORAL_CAPSULE | Freq: Three times a day (TID) | ORAL | Status: DC
  Administered 2019-11-11 – 2019-11-16 (×15): 300 mg via ORAL
  Filled 2019-11-11 (×15): qty 1

## 2019-11-11 MED ORDER — PHENYLEPHRINE 40 MCG/ML (10ML) SYRINGE FOR IV PUSH (FOR BLOOD PRESSURE SUPPORT)
PREFILLED_SYRINGE | INTRAVENOUS | Status: AC
  Filled 2019-11-11: qty 10

## 2019-11-11 SURGICAL SUPPLY — 56 items
APL SKNCLS STERI-STRIP NONHPOA (GAUZE/BANDAGES/DRESSINGS) ×1
BAG DECANTER FOR FLEXI CONT (MISCELLANEOUS) ×2 IMPLANT
BENZOIN TINCTURE PRP APPL 2/3 (GAUZE/BANDAGES/DRESSINGS) ×2 IMPLANT
BLADE CLIPPER SURG (BLADE) ×1 IMPLANT
BUR MATCHSTICK NEURO 3.0 LAGG (BURR) ×2 IMPLANT
BUR PRECISION FLUTE 6.0 (BURR) ×3 IMPLANT
CANISTER SUCT 3000ML PPV (MISCELLANEOUS) ×3 IMPLANT
CARTRIDGE OIL MAESTRO DRILL (MISCELLANEOUS) ×1 IMPLANT
COVER WAND RF STERILE (DRAPES) ×1 IMPLANT
DIFFUSER DRILL AIR PNEUMATIC (MISCELLANEOUS) ×2 IMPLANT
DRAPE LAPAROTOMY 100X72X124 (DRAPES) ×2 IMPLANT
DRAPE MICROSCOPE LEICA (MISCELLANEOUS) ×2 IMPLANT
DRAPE POUCH INSTRU U-SHP 10X18 (DRAPES) ×1 IMPLANT
DRAPE SURG 17X23 STRL (DRAPES) ×8 IMPLANT
DRSG OPSITE POSTOP 4X6 (GAUZE/BANDAGES/DRESSINGS) ×1 IMPLANT
ELECT BLADE 4.0 EZ CLEAN MEGAD (MISCELLANEOUS) ×2
ELECT REM PT RETURN 9FT ADLT (ELECTROSURGICAL) ×2
ELECTRODE BLDE 4.0 EZ CLN MEGD (MISCELLANEOUS) ×1 IMPLANT
ELECTRODE REM PT RTRN 9FT ADLT (ELECTROSURGICAL) ×1 IMPLANT
EVACUATOR 1/8 PVC DRAIN (DRAIN) ×1 IMPLANT
GAUZE 4X4 16PLY RFD (DISPOSABLE) IMPLANT
GAUZE SPONGE 4X4 12PLY STRL (GAUZE/BANDAGES/DRESSINGS) ×2 IMPLANT
GLOVE BIO SURGEON STRL SZ 6.5 (GLOVE) ×4 IMPLANT
GLOVE BIO SURGEON STRL SZ8 (GLOVE) ×2 IMPLANT
GLOVE BIO SURGEON STRL SZ8.5 (GLOVE) ×2 IMPLANT
GLOVE BIOGEL PI IND STRL 6.5 (GLOVE) IMPLANT
GLOVE BIOGEL PI IND STRL 7.5 (GLOVE) IMPLANT
GLOVE BIOGEL PI INDICATOR 6.5 (GLOVE) ×3
GLOVE BIOGEL PI INDICATOR 7.5 (GLOVE) ×1
GLOVE EXAM NITRILE XL STR (GLOVE) IMPLANT
GLOVE SURG SS PI 6.0 STRL IVOR (GLOVE) ×3 IMPLANT
GOWN STRL REUS W/ TWL LRG LVL3 (GOWN DISPOSABLE) IMPLANT
GOWN STRL REUS W/ TWL XL LVL3 (GOWN DISPOSABLE) ×1 IMPLANT
GOWN STRL REUS W/TWL 2XL LVL3 (GOWN DISPOSABLE) IMPLANT
GOWN STRL REUS W/TWL LRG LVL3 (GOWN DISPOSABLE) ×6
GOWN STRL REUS W/TWL XL LVL3 (GOWN DISPOSABLE) ×2
HEMOSTAT POWDER KIT SURGIFOAM (HEMOSTASIS) ×2 IMPLANT
KIT BASIN OR (CUSTOM PROCEDURE TRAY) ×2 IMPLANT
KIT TURNOVER KIT B (KITS) ×2 IMPLANT
NDL HYPO 21X1.5 SAFETY (NEEDLE) IMPLANT
NEEDLE HYPO 21X1.5 SAFETY (NEEDLE) IMPLANT
NEEDLE HYPO 22GX1.5 SAFETY (NEEDLE) ×2 IMPLANT
NS IRRIG 1000ML POUR BTL (IV SOLUTION) ×2 IMPLANT
OIL CARTRIDGE MAESTRO DRILL (MISCELLANEOUS) ×2
PACK LAMINECTOMY NEURO (CUSTOM PROCEDURE TRAY) ×2 IMPLANT
PAD ARMBOARD 7.5X6 YLW CONV (MISCELLANEOUS) ×6 IMPLANT
PATTIES SURGICAL .5 X1 (DISPOSABLE) IMPLANT
RUBBERBAND STERILE (MISCELLANEOUS) ×4 IMPLANT
SPONGE SURGIFOAM ABS GEL SZ50 (HEMOSTASIS) ×1 IMPLANT
STRIP CLOSURE SKIN 1/2X4 (GAUZE/BANDAGES/DRESSINGS) ×2 IMPLANT
SUT VIC AB 1 CT1 18XBRD ANBCTR (SUTURE) ×1 IMPLANT
SUT VIC AB 1 CT1 8-18 (SUTURE) ×2
SUT VIC AB 2-0 CP2 18 (SUTURE) ×2 IMPLANT
TOWEL GREEN STERILE (TOWEL DISPOSABLE) ×2 IMPLANT
TOWEL GREEN STERILE FF (TOWEL DISPOSABLE) ×2 IMPLANT
WATER STERILE IRR 1000ML POUR (IV SOLUTION) ×2 IMPLANT

## 2019-11-11 NOTE — Progress Notes (Signed)
Subjective: The patient is somnolent but arousable.  He is in no apparent distress.  He looks well.  Objective: Vital signs in last 24 hours: Temp:  [98.2 F (36.8 C)] 98.2 F (36.8 C) (12/16 1014) Pulse Rate:  [81-103] 81 (12/16 1050) Resp:  [20] 20 (12/16 1014) BP: (165-183)/(81-84) 165/81 (12/16 1050) SpO2:  [99 %] 99 % (12/16 1014) Weight:  [140.6 kg] 140.6 kg (12/16 1014) Estimated body mass index is 43.85 kg/m as calculated from the following:   Height as of this encounter: 5' 10.5" (1.791 m).   Weight as of this encounter: 140.6 kg.   Intake/Output from previous day: No intake/output data recorded. Intake/Output this shift: Total I/O In: 450 [I.V.:300; IV Piggyback:150] Out: 300 [Blood:300]  Physical exam the patient is somnolent but arousable.  He moves his lower extremities.  He is weak right greater left without change.  Lab Results: No results for input(s): WBC, HGB, HCT, PLT in the last 72 hours. BMET No results for input(s): NA, K, CL, CO2, GLUCOSE, BUN, CREATININE, CALCIUM in the last 72 hours.  Studies/Results: DG Lumbar Spine 2-3 Views  Result Date: 11/11/2019 CLINICAL DATA:  L3-4 laminectomy EXAM: LUMBAR SPINE - 2-3 VIEW COMPARISON:  MRI 10/26/2019 FINDINGS: First lateral intraoperative image demonstrates posterior surgical instrument directed at the L2 vertebral body. Second intraoperative image demonstrates posterior surgical instrument directed at the L3 vertebral body. Third lateral intraoperative image demonstrates posterior surgical instruments directed at the L2 and L4 vertebral bodies. IMPRESSION: Localization as above. Electronically Signed   By: Rolm Baptise M.D.   On: 11/11/2019 17:43    Assessment/Plan: The patient is doing well.  I spoke with his son.  LOS: 0 days     Ophelia Charter 11/11/2019, 5:56 PM

## 2019-11-11 NOTE — Anesthesia Postprocedure Evaluation (Signed)
Anesthesia Post Note  Patient: Nicolas [name]  Procedure(s) Performed: REDO LUMBAR THREE- LUMBAR FOUR LAMINECTOMY/FORAMINOTOMY (N/A Back)     Patient location during evaluation: PACU Anesthesia Type: General Level of consciousness: awake and alert Pain management: pain level controlled Vital Signs Assessment: post-procedure vital signs reviewed and stable Respiratory status: spontaneous breathing, nonlabored ventilation, respiratory function stable and patient connected to nasal cannula oxygen Cardiovascular status: blood pressure returned to baseline and stable Postop Assessment: no apparent nausea or vomiting Anesthetic complications: no    Last Vitals:  Vitals:   11/11/19 1917 11/11/19 2019  BP: (!) 167/85 140/71  Pulse: 86 66  Resp: 20 20  Temp:  36.7 C  SpO2: 98% 98%    Last Pain:  Vitals:   11/11/19 2106  TempSrc:   PainSc: 5                  Tiajuana Amass

## 2019-11-11 NOTE — Op Note (Signed)
Brief history: The patient is a 53 year old white male with history of cervical and thoracic gnosis and myelopathy as well as lumbar spinal stenosis.  He has had multiple previous surgeries.  He has developed recurrent back and right greater left leg pain.  He failed medical management.  He was worked up with a lumbar MRI which demonstrated recurrent/residual spinal stenosis at L3-4.  I discussed the various treatment options with the patient including surgery.  He has weighed the risks, benefits and alternatives and decided proceed with a redo lumbar laminectomy.  Preoperative diagnosis: L2-3 and L3-4 degenerative disc disease, spinal stenosis compressing L3 and L4 nerve roots; lumbago; lumbar radiculopathy; neurogenic claudication  Postoperative diagnosis: The same  Procedure: Bilateral redo L2-3 and L3-4 laminectomy/foraminotomies/medial facetectomy to decompress the bilateral L3 and L4 nerve roots using microdissection  Surgeon: Dr. Earle Gell  Asst.: Arnetha Massy nurse practitioner  Anesthesia: Gen. endotracheal  Estimated blood loss: 150 cc  Drains: 1 medium Hemovac in the epidural space  Complications: None  Description of procedure: The patient was brought to the operating room by the anesthesia team. General endotracheal anesthesia was induced. The patient was turned to the prone position on the Wilson frame. The patient's lumbosacral region was then prepared with Betadine scrub and Betadine solution. Sterile drapes were applied.  I then injected the area to be incised with Marcaine with epinephrine solution. I then used the scalpel to make a linear midline incision over the L2-3 and L3-4 interspace, incising through the old surgical scar. I then used electrocautery to perform a bilateral subperiosteal dissection exposing the remainder of the lamina and facets at L2-3 and L3-4 bilaterally. We then obtained intraoperative radiograph to confirm our location. We then inserted the  [facility] retractor to provide exposure.  I began the decompression by using the high speed drill to perform redo laminotomies at L2-3 and L3-4 bilaterally.  We brought the operative microscope into the field and under its magnification and illumination we completed the micro dissection/decompression.  We carefully dissected away the scar tissue from the thecal sac.  We performed wide foraminotomies about the bilateral L3 and L4 nerve roots completing the decompression.  We then obtained hemostasis using bipolar electrocautery. We irrigated the wound out with bacitracin solution. We inspected the thecal sac and nerve roots and noted they were well decompressed. We then removed the retractor.  We placed a medium Hemovac drain in the epidural space and tunneled it out through a separate stab wound.. We reapproximated patient's thoracolumbar fascia with interrupted #1 Vicryl suture. We reapproximated patient's subcutaneous tissue with interrupted 2-0 Vicryl suture. The reapproximated patient's skin with Steri-Strips and benzoin. The wound was then coated with bacitracin ointment. A sterile dressing was applied. The drapes were removed. The patient was subsequently returned to the supine position where they were extubated by the anesthesia team. He was then transported to the post anesthesia care unit in stable condition. All sponge instrument and needle counts were reportedly correct at the end of this case.

## 2019-11-11 NOTE — Transfer of Care (Signed)
Immediate Anesthesia Transfer of Care Note  Patient: Nicolas [name]  Procedure(s) Performed: REDO LUMBAR THREE- LUMBAR FOUR LAMINECTOMY/FORAMINOTOMY (N/A Back)  Patient Location: PACU  Anesthesia Type:General  Level of Consciousness: drowsy and patient cooperative  Airway & Oxygen Therapy: Patient Spontanous Breathing and Patient connected to face mask oxygen  Post-op Assessment: Report given to RN and Post -op Vital signs reviewed and stable  Post vital signs: Reviewed and stable  Last Vitals:  Vitals Value Taken Time  BP 168/69 11/11/19 1751  Temp    Pulse 98 11/11/19 1752  Resp 20 11/11/19 1752  SpO2 100 % 11/11/19 1752  Vitals shown include unvalidated device data.  Last Pain:  Vitals:   11/11/19 1046  TempSrc:   PainSc: 7       Patients Stated Pain Goal: 4 ([id number])  Complications: No apparent anesthesia complications

## 2019-11-11 NOTE — H&P (Signed)
Subjective: The patient is a 53 year old obese black male who has had a similar cervical thoracic myelopathy.  He is paraparetic.  He has complained of right greater left lower extremity pain.  He has had previous lumbar surgery.  Worked him up further with a lumbar MRI which demonstrated residual spinal stenosis at L3-4.  I discussed the various treatment options with him.  He has decided proceed with a redo lumbar laminectomy.  Past Medical History:  Diagnosis Date  . Arthritis   . Clotting disorder ([place])    pulmonary embolus, DVT  . Depression   . Diabetes mellitus without complication (HCC)    no meds, watching diet  . DVT (deep venous thrombosis) ([place])    Summer 2012  . Hypertension   . Neuromuscular disorder ([place])    spinal stenosis.- pt walks with a walker short distance  . Peripheral vascular disease ([place])   . PONV (postoperative nausea and vomiting)   . Pulmonary embolism ([place])    x 3 in 2005, 2008, 2010  . Sleep apnea    does not wear a c-pap, pt. reports that he gave the CPAP back, denies that he has OSA   . Spinal stenosis     Past Surgical History:  Procedure Laterality Date  . ANTERIOR CERVICAL CORPECTOMY N/A 01/26/2019   Procedure: Cervical Four Corpectomy with Cervical Three-Four, Cervical Four-Five interbody fusion, prosthesis, explore old fusion, possible removal of old plate;  Surgeon: [name], MD;  Location: [facility];  Service: Neurosurgery;  Laterality: N/A;  anterior approach  . CHOLECYSTECTOMY N/A 05/24/2014   Procedure: LAPAROSCOPIC CHOLECYSTECTOMY;  Surgeon: Zenovia Jarred, MD;  Location: [facility];  Service: General;  Laterality: N/A;  . ERCP N/A 05/25/2014   Procedure: ENDOSCOPIC RETROGRADE CHOLANGIOPANCREATOGRAPHY (ERCP);  Surgeon: Beryle Beams, MD;  Location: [facility];  Service: Endoscopy;  Laterality: N/A;  . ERCP N/A 07/16/2014   Procedure: ENDOSCOPIC RETROGRADE CHOLANGIOPANCREATOGRAPHY (ERCP);  Surgeon: Beryle Beams, MD;  Location: [facility];   Service: Endoscopy;  Laterality: N/A;  . ivp filter  jan 2012  . LUMBAR LAMINECTOMY/DECOMPRESSION MICRODISCECTOMY N/A 05/14/2019   Procedure: LAMINECTOMY AND FORAMINOTOMY THORACIC FIVE- THORACIC SIX, THORACIC ELEVEN- THORACIC THORACIC, LUMBAR THREE- LUMBAR FOUR;  Surgeon: [name], MD;  Location: [facility];  Service: Neurosurgery;  Laterality: N/A;  posterior  . SPINE SURGERY  11/30/11   Total 6 back surgeries  . SPINE SURGERY  [id number]   Dr. Cathren Laine in [place], [place]  . [place]     08/2003, cervial spine 2005, 2010 lower back, 2011 lower back, 2012 & 2013  . UPPER GASTROINTESTINAL ENDOSCOPY    . WISDOM TOOTH EXTRACTION     2 removed    Allergies  Allergen Reactions  . Ace Inhibitors Cough  . Lisinopril Cough    Social History   Tobacco Use  . Smoking status: Former Smoker    Packs/day: 1.50    Years: 10.00    Pack years: 15.00    Types: Cigarettes    Quit date: 04/26/2001    Years since quitting: 18.5  . Smokeless tobacco: Never Used  Substance Use Topics  . Alcohol use: Yes    Alcohol/week: 1.0 standard drinks    Types: 1 Standard drinks or equivalent per week    Comment: very little- rare    Family History  Problem Relation Age of Onset  . Cancer Mother 55       Lung  . Diabetes Father   . Prostate cancer Father   .  Hypertension Father   . Hypertension Brother   . Hypertension Brother   . Diabetes Brother   . Heart attack Neg Hx   . Stroke Neg Hx   . Colon cancer Neg Hx   . Esophageal cancer Neg Hx   . Pancreatic cancer Neg Hx   . Rectal cancer Neg Hx   . Stomach cancer Neg Hx    Prior to Admission medications   Medication Sig Start Date End Date Taking? Authorizing Provider  baclofen (LIORESAL) 10 MG tablet Take 0.5 tablets (5 mg total) by mouth at bedtime. 10/08/19  Yes Kirsteins, Luanna Salk, MD  enoxaparin (LOVENOX) 150 MG/ML injection Inject 1 mL (150 mg total) into the skin every 12 (twelve) hours. 11/05/19  Yes Burnell Blanks, MD   furosemide (LASIX) 40 MG tablet TAKE 1 TABLET EVERY DAY, NEED MD APPOINTMENT WITH CARDS FOR REFILLS Patient taking differently: Take 40 mg by mouth daily.  07/07/19  Yes Burnell Blanks, MD  gabapentin (NEURONTIN) 300 MG capsule Take 1 capsule (300 mg total) by mouth 3 (three) times daily. 06/03/19  Yes Angiulli, Lavon Paganini, PA-C  losartan (COZAAR) 50 MG tablet Take 1 tablet (50 mg total) by mouth daily. 10/28/19 01/26/20 Yes Imogene Burn, PA-C  metoprolol succinate (TOPROL-XL) 50 MG 24 hr tablet Take 1.5 tablets (75 mg total) by mouth once a day Patient taking differently: Take 75 mg by mouth daily after breakfast. Take 1.5 tablets (75 mg total) by mouth once a day 10/07/19  Yes Imogene Burn, PA-C  Multiple Vitamin (MULTIVITAMIN WITH MINERALS) TABS tablet Take 1 tablet by mouth daily.    Yes [provider]  potassium chloride SA (K-DUR) 20 MEQ tablet TAKE 1 TABLET EVERY DAY, NEED MD APPOINTMENT WITH CARDS FOR REFILLS Patient taking differently: Take 20 mEq by mouth daily.  07/07/19  Yes Burnell Blanks, MD  acetaminophen (TYLENOL) 325 MG tablet Take 2 tablets (650 mg total) by mouth every 4 (four) hours as needed for mild pain ((score 1 to 3) or temp > 100.5). 06/03/19   Angiulli, Lavon Paganini, PA-C  warfarin (COUMADIN) 10 MG tablet TAKE 1 TABLET DAILY EXCEPT 1/2 TABLET ON Monday, Wednesday, Friday OR AS DIRECTED BY COUMADIN CLINIC. 11/09/19   Burnell Blanks, MD     Review of Systems  Positive ROS: As above  All other systems have been reviewed and were otherwise negative with the exception of those mentioned in the HPI and as above.  Objective: Vital signs in last 24 hours: Temp:  [98.2 F (36.8 C)] 98.2 F (36.8 C) (12/16 1014) Pulse Rate:  [81-103] 81 (12/16 1050) Resp:  [20] 20 (12/16 1014) BP: (165-183)/(81-84) 165/81 (12/16 1050) SpO2:  [99 %] 99 % (12/16 1014) Weight:  [140.6 kg] 140.6 kg (12/16 1014) Estimated body mass index is 43.85 kg/m as  calculated from the following:   Height as of this encounter: 5' 10.5" (1.791 m).   Weight as of this encounter: 140.6 kg.   General Appearance: Alert Head: Normocephalic, without obvious abnormality, atraumatic Eyes: PERRL, conjunctiva/corneas clear, EOM's intact,    Ears: Normal  Throat: Normal  Neck: Limited range of motion, his anterior cervical incision is well-healed.  Back: unremarkable Lungs: Clear to auscultation bilaterally, respirations unlabored Heart: Regular rate and rhythm, no murmur, rub or gallop Abdomen: Soft, non-tender Extremities: Lower extremity edema. Skin: unremarkable  NEUROLOGIC:   Mental status: alert and oriented,Motor Exam -the patient is paraparetic sensory Exam - grossly normal Reflexes:  Coordination -  grossly normal Gait -unsteady, myelopathic Balance - grossly normal Cranial Nerves: I: smell Not tested  II: visual acuity  OS: Normal  OD: Normal   II: visual fields Full to confrontation  II: pupils Equal, round, reactive to light  III,VII: ptosis None  III,IV,VI: extraocular muscles  Full ROM  V: mastication Normal  V: facial light touch sensation  Normal  V,VII: corneal reflex  Present  VII: facial muscle function - upper  Normal  VII: facial muscle function - lower Normal  VIII: hearing Not tested  IX: soft palate elevation  Normal  IX,X: gag reflex Present  XI: trapezius strength  5/5  XI: sternocleidomastoid strength 5/5  XI: neck flexion strength  5/5  XII: tongue strength  Normal    Data Review Lab Results  Component Value Date   WBC 8.2 11/05/2019   HGB 15.5 11/05/2019   HCT 48.0 11/05/2019   MCV 92.0 11/05/2019   PLT 208 11/05/2019   Lab Results  Component Value Date   NA 139 11/05/2019   K 4.1 11/05/2019   CL 105 11/05/2019   CO2 25 11/05/2019   BUN 11 11/05/2019   CREATININE 0.65 11/05/2019   GLUCOSE 168 (H) 11/05/2019   Lab Results  Component Value Date   INR 0.9 11/11/2019    Assessment/Plan: L3-4  spinal stenosis, lumbago, lumbar radiculopathy: I have discussed the situation with the patient.  I reviewed his MRI scan with him and pointed out the abnormalities.  We have discussed the various treatment options including a redo L3-4 discectomy.  I have described the surgery to him.  I have shown him surgical models.  We have discussed the risk, benefits, alternatives, expected postop course, and likelihood of achieving our goals with surgery.  I have answered all his questions.  He has decided to proceed with surgery.   Ophelia Charter 11/11/2019 2:27 PM

## 2019-11-11 NOTE — Anesthesia Procedure Notes (Signed)
Procedure Name: Intubation Date/Time: 11/11/2019 2:44 PM Performed by: Janene Harvey, CRNA Pre-anesthesia Checklist: Patient identified, Emergency Drugs available, Suction available and Patient being monitored Patient Re-evaluated:Patient Re-evaluated prior to induction Oxygen Delivery Method: Circle system utilized Preoxygenation: Pre-oxygenation with 100% oxygen Induction Type: IV induction Ventilation: Mask ventilation without difficulty Laryngoscope Size: Mac and 4 Grade View: Grade I Tube type: Oral Tube size: 7.5 mm Number of attempts: 1 Airway Equipment and Method: Stylet and Oral airway Placement Confirmation: ETT inserted through vocal cords under direct vision,  positive ETCO2 and breath sounds checked- equal and bilateral Secured at: 23 cm Tube secured with: Tape Dental Injury: Teeth and Oropharynx as per pre-operative assessment

## 2019-11-12 ENCOUNTER — Encounter: Payer: Self-pay | Admitting: *Deleted

## 2019-11-12 MED FILL — Thrombin For Soln 5000 Unit: CUTANEOUS | Qty: 5000 | Status: AC

## 2019-11-12 MED FILL — Gelatin Absorbable MT Powder: OROMUCOSAL | Qty: 1 | Status: AC

## 2019-11-12 NOTE — Progress Notes (Signed)
Chaplain reviewed some information concerning Advanced Directives with the patient and answered any initial questions.  The chaplain delivered the paperwork and the patient will go through it with his son.  The chaplain will follow-up at the completion of the paperwork.  Brion Aliment Chaplain Resident For questions concerning this note please contact me by pager [phone]

## 2019-11-12 NOTE — Plan of Care (Signed)
  Problem: Education: Goal: Knowledge of the prescribed therapeutic regimen will improve Outcome: Progressing   Problem: Activity: Goal: Ability to tolerate increased activity will improve Outcome: Progressing   Problem: Activity: Goal: Will remain free from falls Outcome: Progressing   Problem: Education: Goal: Knowledge of the prescribed therapeutic regimen will improve Outcome: Progressing

## 2019-11-12 NOTE — Progress Notes (Signed)
Attempted to get patient up and moving this morning but patient rerfused per Sherlynn Stalls RN

## 2019-11-12 NOTE — Progress Notes (Signed)
Pt's hemovac was discontinued per MD order. Pt tolerated well . Will continue to monitor.

## 2019-11-12 NOTE — Progress Notes (Signed)
   Providing Compassionate, Quality Care - Together   Subjective: Patient reports pre-operative pain is much improved. He still has significant weakness of his right lower extremity. He reports physical therapy is recommending CIR.  Objective: Vital signs in last 24 hours: Temp:  [97.2 F (36.2 C)-98.3 F (36.8 C)] 97.7 F (36.5 C) (12/17 1203) Pulse Rate:  [66-99] 72 (12/17 1203) Resp:  [7-21] 18 (12/17 1203) BP: (111-171)/(52-87) 114/56 (12/17 1203) SpO2:  [94 %-100 %] 98 % (12/17 1203)  Intake/Output from previous day: 12/16 0701 - 12/17 0700 In: 1248.3 [P.O.:640; I.V.:358.3; IV Piggyback:250] Out: 1980 [Urine:1500; Drains:180; Blood:300] Intake/Output this shift: Total I/O In: 323.3 [P.O.:240; I.V.:33.3; IV Piggyback:50] Out: 61 [Urine:675; Drains:40]  Alert and oriented x 4 PERRLA CN II-XII grossly intact RLE 3/5; RUE, LUE, LLE 5/5 Sensation grossly intact Incision is covered with Honeycomb dressing and Steri Strips; Dressing is clean, dry, and intact; Hemovac in place   Lab Results: No results for input(s): WBC, HGB, HCT, PLT in the last 72 hours. BMET No results for input(s): NA, K, CL, CO2, GLUCOSE, BUN, CREATININE, CALCIUM in the last 72 hours.  Studies/Results: DG Lumbar Spine 2-3 Views  Result Date: 11/11/2019 CLINICAL DATA:  L3-4 laminectomy EXAM: LUMBAR SPINE - 2-3 VIEW COMPARISON:  MRI 10/26/2019 FINDINGS: First lateral intraoperative image demonstrates posterior surgical instrument directed at the L2 vertebral body. Second intraoperative image demonstrates posterior surgical instrument directed at the L3 vertebral body. Third lateral intraoperative image demonstrates posterior surgical instruments directed at the L2 and L4 vertebral bodies. IMPRESSION: Localization as above. Electronically Signed   By: Rolm Baptise M.D.   On: 11/11/2019 17:43    Assessment/Plan: Patient is one day status post bilateral redo L2-3 and L3-4  laminectomy/foraminotomies/medial facetectomy to decompress the bilateral L3 and L4 nerve roots. He is doing well post operatively. Therapies feel he would benefit from CIR.   LOS: 0 days    -Order placed by PT for CIR admission screening -Remove Hemovac drain -Continue to mobilize   Viona Gilmore, DNP, AGNP-C Nurse Practitioner  [facility]. [address], [address], [place], [place] [address] P: [phone]    F: [phone]  11/12/2019, 12:17 PM

## 2019-11-12 NOTE — Evaluation (Signed)
Physical Therapy Evaluation Patient Details Name: Nicolas [name] MRN: [id number] DOB: 04-Jun-1966 Today's Date: 11/12/2019   History of Present Illness   Pt is a 53 y.o. M with significant PMH of clotting disorder, diabetes mellitus, DVT, PE, prior back surgeries. He presents s/p bilateral redo L2-3 and L3-4 laminectomy/foraminotomies/medial facetectomy to decompress the bilateral L3 and L4 nerve roots using microdissection 12/16.  Clinical Impression  Pt admitted s/p procedure above. Prior to admission, pt is a limited household ambulator using RW modI and power wheelchair for further distances. On PT evaluation, pt presents with decreased functional mobility secondary to balance impairments, weakness (RLE weaker than LLE), and pain. Requiring moderate assist for stand pivot transfer to chair; unable to progress ambulation due to right knee buckle. Pt needs to be at a modified independent level to discharge home due to being alone during the day; recommending CIR to maximize functional independence.     Follow Up Recommendations CIR    Equipment Recommendations  None recommended by PT    Recommendations for Other Services Rehab consult;OT consult     Precautions / Restrictions Precautions Precautions: Fall;Other (comment) Precaution Comments: R knee buckle Restrictions Weight Bearing Restrictions: No      Mobility  Bed Mobility Overal bed mobility: Needs Assistance Bed Mobility: Rolling;Sidelying to Sit Rolling: Min guard Sidelying to sit: Min guard       General bed mobility comments: Min guard for safety, increased time/effort, use of bed rail  Transfers Overall transfer level: Needs assistance Equipment used: Rolling walker (2 wheeled) Transfers: Sit to/from [facility] Sit to Stand: Mod assist Stand pivot transfers: Mod assist;+2 safety/equipment       General transfer comment: ModA to boost up to stand from elevated bed height, increased effort  noted. Performed stand pivot towards left, + right knee buckle, cues for sequencing/direction. Heavy reliance through arms on walker  Ambulation/Gait             General Gait Details: Unable  Stairs            Wheelchair Mobility    Modified Rankin (Stroke Patients Only)       Balance Overall balance assessment: Needs assistance Sitting-balance support: Feet supported Sitting balance-Leahy Scale: Fair     Standing balance support: Bilateral upper extremity supported Standing balance-Leahy Scale: Poor Standing balance comment: heavily reliant on external support                             Pertinent Vitals/Pain Pain Assessment: 0-10 Pain Score: 5  Pain Location: surgical site with radicular symptoms into right glute Pain Descriptors / Indicators: Guarding;Grimacing Pain Intervention(s): Monitored during session;Premedicated before session    Home Living Family/patient expects to be discharged to:: Private residence Living Arrangements: Children(son) Available Help at Discharge: Family;Available PRN/intermittently Type of Home: House Home Access: Ramped entrance     Home Layout: One level Home Equipment: Walker - 2 wheels;Tub bench;Wheelchair - power;Hospital bed;Adaptive equipment      Prior Function Level of Independence: Needs assistance   Gait / Transfers Assistance Needed: household ambulator with RW up to 70 feet, uses power chair in community  ADL's / Homemaking Assistance Needed: pt does the cooking/washing dishes from power chair that lifts him up 4'        Hand Dominance        Extremity/Trunk Assessment   Upper Extremity Assessment Upper Extremity Assessment: Overall WFL for tasks assessed    Lower  Extremity Assessment Lower Extremity Assessment: RLE deficits/detail;LLE deficits/detail RLE Deficits / Details: HIp flexion 1/5, knee extension 2/5, ankle dorsiflexion 2/5 LLE Deficits / Details: Grossly 4/5    Cervical /  Trunk Assessment Cervical / Trunk Assessment: Other exceptions Cervical / Trunk Exceptions: increased body habitus  Communication   Communication: No difficulties  Cognition Arousal/Alertness: Awake/alert Behavior During Therapy: WFL for tasks assessed/performed Overall Cognitive Status: Within Functional Limits for tasks assessed                                        General Comments      Exercises Other Exercises Other Exercises: Encouraged LAQ's while seated for quad strengthening   Assessment/Plan    PT Assessment Patient needs continued PT services  PT Problem List Decreased strength;Decreased activity tolerance;Decreased balance;Decreased mobility;Pain;Impaired sensation       PT Treatment Interventions DME instruction;Gait training;Functional mobility training;Therapeutic activities;Therapeutic exercise;Balance training;Patient/family education;Wheelchair mobility training    PT Goals (Current goals can be found in the Care Plan section)  Acute Rehab PT Goals Patient Stated Goal: "have a few more days here to get my right leg stronger." PT Goal Formulation: With patient Time For Goal Achievement: 11/26/19 Potential to Achieve Goals: Good    Frequency Min 5X/week   Barriers to discharge        Co-evaluation               AM-PAC PT "6 Clicks" Mobility  Outcome Measure Help needed turning from your back to your side while in a flat bed without using bedrails?: A Little Help needed moving from lying on your back to sitting on the side of a flat bed without using bedrails?: A Little Help needed moving to and from a bed to a chair (including a wheelchair)?: A Lot Help needed standing up from a chair using your arms (e.g., wheelchair or bedside chair)?: A Lot Help needed to walk in hospital room?: A Lot Help needed climbing 3-5 steps with a railing? : Total 6 Click Score: 13    End of Session Equipment Utilized During Treatment: Gait  belt Activity Tolerance: Patient tolerated treatment well Patient left: in chair;with call bell/phone within reach;with chair alarm set Nurse Communication: Mobility status PT Visit Diagnosis: Unsteadiness on feet (R26.81);Other abnormalities of gait and mobility (R26.89);Muscle weakness (generalized) (M62.81);Difficulty in walking, not elsewhere classified (R26.2);Pain Pain - part of body: (back)    Time: [id number] PT Time Calculation (min) (ACUTE ONLY): 22 min   Charges:   PT Evaluation $PT Eval Moderate Complexity: 1 Mod          Ellamae Sia, [place], DPT Acute Rehabilitation Services Pager [phone] Office [phone]   Willy Eddy 11/12/2019, 10:48 AM

## 2019-11-12 NOTE — Plan of Care (Signed)
  Problem: Pain Management: Goal: Pain level will decrease Outcome: Progressing   

## 2019-11-12 NOTE — Progress Notes (Signed)
Rehab Admissions Coordinator Note:  Per PT recommendations, patient was screened by Michel Santee for appropriateness for an Inpatient Acute Rehab Consult.  At this time, we are recommending Inpatient Rehab consult. I will place a consult order.   Michel Santee, PT, DPT 11/12/2019, 1:43 PM  I can be reached at  [phone].

## 2019-11-12 NOTE — Progress Notes (Signed)
Subjective: The patient is alert and pleasant.  He looks well.  He says his right leg pain is gone.  Objective: Vital signs in last 24 hours: Temp:  [97.5 F (36.4 C)-98.9 F (37.2 C)] 98.9 F (37.2 C) (12/17 1549) Pulse Rate:  [66-88] 76 (12/17 1550) Resp:  [13-20] 16 (12/17 1549) BP: (88-167)/(48-85) 111/59 (12/17 1800) SpO2:  [96 %-100 %] 100 % (12/17 1549) Estimated body mass index is 43.85 kg/m as calculated from the following:   Height as of this encounter: 5' 10.5" (1.791 m).   Weight as of this encounter: 140.6 kg.   Intake/Output from previous day: 12/16 0701 - 12/17 0700 In: 1248.3 [P.O.:640; I.V.:358.3; IV Piggyback:250] Out: 1980 [Urine:1500; Drains:180; Blood:300] Intake/Output this shift: Total I/O In: 683.3 [P.O.:600; I.V.:33.3; IV Piggyback:50] Out: 1205 [Urine:1115; Drains:90]  Physical exam the patient is alert and pleasant.  He is paraparetic with right greater than left lower extremity weakness which is chronic.  Lab Results: No results for input(s): WBC, HGB, HCT, PLT in the last 72 hours. BMET No results for input(s): NA, K, CL, CO2, GLUCOSE, BUN, CREATININE, CALCIUM in the last 72 hours.  Studies/Results: DG Lumbar Spine 2-3 Views  Result Date: 11/11/2019 CLINICAL DATA:  L3-4 laminectomy EXAM: LUMBAR SPINE - 2-3 VIEW COMPARISON:  MRI 10/26/2019 FINDINGS: First lateral intraoperative image demonstrates posterior surgical instrument directed at the L2 vertebral body. Second intraoperative image demonstrates posterior surgical instrument directed at the L3 vertebral body. Third lateral intraoperative image demonstrates posterior surgical instruments directed at the L2 and L4 vertebral bodies. IMPRESSION: Localization as above. Electronically Signed   By: Rolm Baptise M.D.   On: 11/11/2019 17:43    Assessment/Plan: Postop day #1: The patient is doing well.  I plan his leg pain is gone.  I think he would benefit from rehab.  We are awaiting placement.  LOS: 0 days     Ophelia Charter 11/12/2019, 6:09 PM

## 2019-11-13 NOTE — Evaluation (Addendum)
Occupational Therapy Evaluation Patient Details Name: Nicolas [name] MRN: [id number] DOB: May 19, 1966 Today's Date: 11/13/2019    History of Present Illness  Pt is a 53 y.o. M with significant PMH of clotting disorder, diabetes mellitus, DVT, PE, prior back surgeries. He presents s/p bilateral redo L2-3 and L3-4 laminectomy/foraminotomies/medial facetectomy to decompress the bilateral L3 and L4 nerve roots using microdissection 12/16.   Clinical Impression   PTA patient independent using RW for mobility, ADLs and IADLs.  Pt admitted for above and limited by problem list below, including pain, impaired balance, decreased activity tolerance and generalized weakness. He requires mod assist for transfers to recliner, max-total assist for LB ADLS, and setup for UB/grooming at sink.  He was educated on back precautions, ADL compensatory techniques, safety and recommendations.  Patient will benefit from intensive CIR level rehab in order to optimize independence and return to PLOF modified independent level with ADLs, mobility.     Follow Up Recommendations  CIR    Equipment Recommendations  Other (comment)(TBD at next venue of care)    Recommendations for Other Services Rehab consult     Precautions / Restrictions Precautions Precautions: Fall;Other (comment);Back Precaution Booklet Issued: No Precaution Comments: R knee buckle Restrictions Weight Bearing Restrictions: No      Mobility Bed Mobility Overal bed mobility: Needs Assistance Bed Mobility: Rolling;Sidelying to Sit Rolling: Min guard Sidelying to sit: Min guard       General bed mobility comments: Min guard for safety, increased time/effort, use of bed rail  Transfers Overall transfer level: Needs assistance Equipment used: Rolling walker (2 wheeled) Transfers: Sit to/from [facility] Sit to Stand: Mod assist Stand pivot transfers: Mod assist       General transfer comment: mod assist to power up  and steady from elevated EOB; increased time and effort with R knee buckle and min cueing for safety and technique (pt reports increased stability with shoes with ankle braces)    Balance Overall balance assessment: Needs assistance Sitting-balance support: No upper extremity supported;Feet supported Sitting balance-Leahy Scale: Fair     Standing balance support: Bilateral upper extremity supported;During functional activity Standing balance-Leahy Scale: Poor Standing balance comment: heavily reliant on external support                           ADL either performed or assessed with clinical judgement   ADL Overall ADL's : Needs assistance/impaired     Grooming: Set up;Sitting   Upper Body Bathing: Set up;Sitting   Lower Body Bathing: Moderate assistance;Sit to/from stand   Upper Body Dressing : Set up;Sitting   Lower Body Dressing: Total assistance;Sit to/from stand Lower Body Dressing Details (indicate cue type and reason): assist for shoes, socks and mod assist sit to stand  Toilet Transfer: Moderate assistance;Stand-pivot;RW Toilet Transfer Details (indicate cue type and reason): simulated to recliner          Functional mobility during ADLs: Moderate assistance;Rolling walker;Cueing for safety General ADL Comments: pt limited by pain, weakness, impaired balance     Vision   Vision Assessment?: No apparent visual deficits     Perception     Praxis      Pertinent Vitals/Pain Pain Assessment: 0-10 Pain Score: 6  Pain Location: groin area, back incisional  Pain Descriptors / Indicators: Discomfort;Operative site guarding;Pressure Pain Intervention(s): Monitored during session;Repositioned;Patient requesting pain meds-RN notified     Hand Dominance Right   Extremity/Trunk Assessment Upper Extremity Assessment Upper Extremity Assessment:  Overall WFL for tasks assessed   Lower Extremity Assessment Lower Extremity Assessment: Overall WFL for tasks  assessed   Cervical / Trunk Assessment Cervical / Trunk Assessment: Other exceptions Cervical / Trunk Exceptions: increased body habitus, back surgery   Communication Communication Communication: No difficulties   Cognition Arousal/Alertness: Awake/alert Behavior During Therapy: WFL for tasks assessed/performed Overall Cognitive Status: Within Functional Limits for tasks assessed                                     General Comments       Exercises     Shoulder Instructions      Home Living Family/patient expects to be discharged to:: Private residence Living Arrangements: Children(son) Available Help at Discharge: Family Type of Home: House Home Access: Ramped entrance     Home Layout: One level     Bathroom Shower/Tub: Teacher, early years/pre: [place]: Environmental consultant - 2 wheels;Tub bench;Wheelchair - power;Hospital bed;Adaptive equipment Adaptive Equipment: Reacher;Sock aid;Long-handled shoe horn        Prior Functioning/Environment Level of Independence: Needs assistance  Gait / Transfers Assistance Needed: household ambulator with RW up to 70 feet, uses power chair in community ADL's / Homemaking Assistance Needed:  independent ADLs, pt does the cooking/washing dishes from power chair that lifts him up 4'            OT Problem List: Decreased strength;Decreased activity tolerance;Impaired balance (sitting and/or standing);Decreased knowledge of precautions;Decreased knowledge of use of DME or AE;Pain;Obesity      OT Treatment/Interventions: Self-care/ADL training;Energy conservation;DME and/or AE instruction;Therapeutic exercise;Therapeutic activities;Patient/family education;Balance training    OT Goals(Current goals can be found in the care plan section) Acute Rehab OT Goals Patient Stated Goal: to get to rehab  OT Goal Formulation: With patient Time For Goal Achievement: 11/27/19 Potential to Achieve Goals: Good   OT Frequency: Min 2X/week   Barriers to D/C:            Co-evaluation              AM-PAC OT "6 Clicks" Daily Activity     Outcome Measure Help from another person eating meals?: None Help from another person taking care of personal grooming?: A Little Help from another person toileting, which includes using toliet, bedpan, or urinal?: A Lot Help from another person bathing (including washing, rinsing, drying)?: A Lot Help from another person to put on and taking off regular upper body clothing?: A Little Help from another person to put on and taking off regular lower body clothing?: A Lot 6 Click Score: 16   End of Session Equipment Utilized During Treatment: Gait belt;Rolling walker Nurse Communication: Mobility status;Patient requests pain meds;Precautions  Activity Tolerance: Patient tolerated treatment well Patient left: in chair;with call bell/phone within reach;with chair alarm set  OT Visit Diagnosis: Other abnormalities of gait and mobility (R26.89);Muscle weakness (generalized) (M62.81);Pain Pain - part of body: (groin, back )                Time: [id number] OT Time Calculation (min): 24 min Charges:  OT General Charges $OT Visit: 1 Visit OT Evaluation $OT Eval Moderate Complexity: 1 Mod OT Treatments $Self Care/Home Management : 8-22 mins  Jolaine Artist, OT Acute Rehabilitation Services Pager [phone] Office [phone]   Delight Stare 11/13/2019, 11:17 AM

## 2019-11-13 NOTE — Care Management Obs Status (Signed)
[facility] NOTIFICATION   Patient Details  Name: Nicolas [name] MRN: [id number] Date of Birth: Jul 31, 1966   Medicare Observation Status Notification Given:       Pollie Friar, RN 11/13/2019, 2:20 PM

## 2019-11-13 NOTE — Progress Notes (Signed)
Subjective: The patient is alert and pleasant.  He looks well.  He is pleased that his right leg pain has resolved with surgery.  Objective: Vital signs in last 24 hours: Temp:  [97.5 F (36.4 C)-98.9 F (37.2 C)] 98.9 F (37.2 C) (12/18 0718) Pulse Rate:  [72-88] 84 (12/18 0718) Resp:  [15-20] 16 (12/18 0718) BP: (88-129)/(48-70) 129/70 (12/18 0718) SpO2:  [96 %-100 %] 100 % (12/18 0718) Estimated body mass index is 43.85 kg/m as calculated from the following:   Height as of this encounter: 5' 10.5" (1.791 m).   Weight as of this encounter: 140.6 kg.   Intake/Output from previous day: 12/17 0701 - 12/18 0700 In: 683.3 [P.O.:600; I.V.:33.3; IV Piggyback:50] Out: [id number]; Drains:90] Intake/Output this shift: No intake/output data recorded.  Physical exam patient is alert and oriented.  He is paraparetic with weakness in his right greater left lower extremity without change from his preoperative status.  Lab Results: No results for input(s): WBC, HGB, HCT, PLT in the last 72 hours. BMET No results for input(s): NA, K, CL, CO2, GLUCOSE, BUN, CREATININE, CALCIUM in the last 72 hours.  Studies/Results: DG Lumbar Spine 2-3 Views  Result Date: 11/11/2019 CLINICAL DATA:  L3-4 laminectomy EXAM: LUMBAR SPINE - 2-3 VIEW COMPARISON:  MRI 10/26/2019 FINDINGS: First lateral intraoperative image demonstrates posterior surgical instrument directed at the L2 vertebral body. Second intraoperative image demonstrates posterior surgical instrument directed at the L3 vertebral body. Third lateral intraoperative image demonstrates posterior surgical instruments directed at the L2 and L4 vertebral bodies. IMPRESSION: Localization as above. Electronically Signed   By: Rolm Baptise M.D.   On: 11/11/2019 17:43    Assessment/Plan: Postop day #2: I think the patient would benefit from rehab to help him mobilize.  We are awaiting placement.  I have answered all his questions.  LOS: 0 days      Ophelia Charter 11/13/2019, 8:00 AM

## 2019-11-13 NOTE — NC FL2 (Signed)
[place] LEVEL OF CARE SCREENING TOOL     IDENTIFICATION  Patient Name: BENET KITCHELL Birthdate: [birth year] Sex: male Admission Date (Current Location): 11/11/2019  [facility] and [place] Number:  Herbalist and Address:  [facility], [place] [address] [place], [place] [address]      Provider Number: [id number]  Attending Physician Name and Address:  [name], MD  Relative Name and Phone Number:       Current Level of Care: Hospital Recommended Level of Care: [place] Prior Approval Number:    Date Approved/Denied:   PASRR Number: [id number]  Discharge Plan: SNF    Current Diagnoses: Patient Active Problem List   Diagnosis Date Noted  . Spinal stenosis of lumbar region with neurogenic claudication 11/11/2019  . Drug induced constipation   . Hyponatremia   . Labile blood glucose   . Diabetes mellitus type 2 in obese ([place])   . Morbid obesity ([place])   . Thoracic myelopathy 05/14/2019  . Cervical spondylosis with myelopathy 01/26/2019  . Class 3 severe obesity due to excess calories with serious comorbidity and body mass index (BMI) of 40.0 to 44.9 in adult ([place]) 10/10/2017  . Prediabetes 10/10/2017  . Debility 10/10/2017  . Able to mobilize using indoor motorized wheelchair 10/10/2017  . Frequent falls 02/01/2017  . At high risk for falls 03/12/2016  . Hypoxemia 05/23/2014  . Chronic anticoagulation 05/21/2014  . Spinal stenosis 12/02/2013  . Venous stasis dermatitis 09/26/2012  . Encounter for long-term (current) use of anticoagulants 03/26/2012  . Spinal stenosis of lumbar region 03/06/2012  . HTN (hypertension) 03/06/2012  . Hx pulmonary embolism 03/06/2012  . Lower extremity edema 03/06/2012    Orientation RESPIRATION BLADDER Height & Weight     Self, Time, Situation, Place  Normal Continent Weight: (!) 140.6 kg Height:  5' 10.5" (179.1 cm)  BEHAVIORAL SYMPTOMS/MOOD NEUROLOGICAL BOWEL  NUTRITION STATUS      Continent Diet(regular with thin liquids)  AMBULATORY STATUS COMMUNICATION OF NEEDS Skin   Limited Assist Verbally Surgical wounds(surgery incision to back with dressing)                       Personal Care Assistance Level of Assistance  Bathing, Feeding, Dressing Bathing Assistance: Limited assistance Feeding assistance: Independent Dressing Assistance: Limited assistance     Functional Limitations Info  Sight, Hearing, Speech Sight Info: Adequate Hearing Info: Adequate Speech Info: Adequate    SPECIAL CARE FACTORS FREQUENCY  PT (By licensed PT), OT (By licensed OT)     PT Frequency: 5x/wk OT Frequency: 5x/wk            Contractures Contractures Info: Not present    Additional Factors Info  Code Status, Allergies, Psychotropic Code Status Info: Full Allergies Info: ace inhibitors/ lisinopril Psychotropic Info: baclofen 5 mg at bedtime/ Neurontin 300 mg three times a day         Current Medications (11/13/2019):  This is the current hospital active medication list Current Facility-Administered Medications  Medication Dose Route Frequency Provider Last Rate Last Admin  . 0.9 %  sodium chloride infusion  250 mL Intravenous Continuous [name], MD 10 mL/hr at 11/12/19 0400 Rate Verify at 11/12/19 0400  . acetaminophen (TYLENOL) tablet 650 mg  650 mg Oral Q4H PRN [name], MD       Or  . acetaminophen (TYLENOL) suppository 650 mg  650 mg Rectal Q4H PRN [name], MD      .  baclofen (LIORESAL) tablet 5 mg  5 mg Oral QHS [name], MD   5 mg at 11/12/19 2126  . bisacodyl (DULCOLAX) suppository 10 mg  10 mg Rectal Daily PRN [name], MD      . cyclobenzaprine (FLEXERIL) tablet 10 mg  10 mg Oral TID PRN [name], MD   10 mg at 11/11/19 2316  . docusate sodium (COLACE) capsule 100 mg  100 mg Oral BID [name], MD   100 mg at 11/13/19 1044  . furosemide (LASIX) tablet 40 mg  40 mg Oral Daily  [name], MD   40 mg at 11/13/19 1044  . gabapentin (NEURONTIN) capsule 300 mg  300 mg Oral TID [name], MD   300 mg at 11/13/19 1044  . losartan (COZAAR) tablet 50 mg  50 mg Oral Daily [name], MD   50 mg at 11/13/19 1044  . menthol-cetylpyridinium (CEPACOL) lozenge 3 mg  1 lozenge Oral PRN [name], MD       Or  . phenol [facility]) mouth spray 1 spray  1 spray Mouth/Throat PRN [name], MD      . metoprolol succinate (TOPROL-XL) 24 hr tablet 75 mg  75 mg Oral QPC breakfast [name], MD   75 mg at 11/13/19 0830  . morphine 4 MG/ML injection 4 mg  4 mg Intravenous Q2H PRN [name], MD      . ondansetron [facility]) tablet 4 mg  4 mg Oral Q6H PRN [name], MD       Or  . ondansetron [facility]) injection 4 mg  4 mg Intravenous Q6H PRN [name], MD      . oxyCODONE (Oxy IR/ROXICODONE) immediate release tablet 10 mg  10 mg Oral Q3H PRN [name], MD   10 mg at 11/13/19 1043  . oxyCODONE (Oxy IR/ROXICODONE) immediate release tablet 5 mg  5 mg Oral Q3H PRN [name], MD   5 mg at 11/11/19 2135  . potassium chloride SA (KLOR-CON) CR tablet 20 mEq  20 mEq Oral Daily [name], MD   20 mEq at 11/13/19 1044  . sodium chloride flush (NS) 0.9 % injection 3 mL  3 mL Intravenous Q12H [name], MD   3 mL at 11/13/19 1044  . sodium chloride flush (NS) 0.9 % injection 3 mL  3 mL Intravenous PRN [name], MD         Discharge Medications: Please see discharge summary for a list of discharge medications.  Relevant Imaging Results:  Relevant Lab Results:   Additional Information SS#: [id number]  Pollie Friar, RN

## 2019-11-13 NOTE — Progress Notes (Signed)
Physical Therapy Treatment Patient Details Name: Nicolas [name] MRN: [id number] DOB: 1965/12/03 Today's Date: 11/13/2019    History of Present Illness  Pt is a 53 y.o. M with significant PMH of clotting disorder, diabetes mellitus, DVT, PE, prior back surgeries. He presents s/p bilateral redo L2-3 and L3-4 laminectomy/foraminotomies/medial facetectomy to decompress the bilateral L3 and L4 nerve roots using microdissection 12/16.    PT Comments    Pt making good progress towards physical therapy goals. Continues to require moderate assist for functional mobility; bilateral AFO's donned this session. Ambulating 10 feet x 2 with walker and close chair follow. Displays recurrent right knee buckle but responds well to cues for quad activation in midstance. Continue to recommend comprehensive inpatient rehab (CIR) for post-acute therapy needs.    Follow Up Recommendations  CIR     Equipment Recommendations  None recommended by PT    Recommendations for Other Services       Precautions / Restrictions Precautions Precautions: Fall;Other (comment);Back Precaution Booklet Issued: No Precaution Comments: R knee buckle Required Braces or Orthoses: Other Brace Other Brace: bilat AFO's Restrictions Weight Bearing Restrictions: No    Mobility  Bed Mobility Overal bed mobility: Needs Assistance Bed Mobility: Rolling;Sidelying to Sit Rolling: Min guard Sidelying to sit: Min guard       General bed mobility comments: OOB in chair  Transfers Overall transfer level: Needs assistance Equipment used: Rolling walker (2 wheeled) Transfers: Sit to/from [facility] Sit to Stand: Mod assist Stand pivot transfers: Mod assist       General transfer comment: ModA to power up and steady  Ambulation/Gait Ambulation/Gait assistance: Mod assist Gait Distance (Feet): 20 Feet(10, 10) Assistive device: Rolling walker (2 wheeled) Gait Pattern/deviations: Step-to  pattern;Step-through pattern;Decreased stance time - right;Decreased dorsiflexion - right;Decreased dorsiflexion - left Gait velocity: decreased Gait velocity interpretation: <1.8 ft/sec, indicate of risk for recurrent falls General Gait Details: Pt requiring modA for stability, frequent right knee buckle but no overt LOB. Cues for sequencing, walker positioning, right knee extension in midstance. Pt with heavy reliance through arms on walker   Stairs             Wheelchair Mobility    Modified Rankin (Stroke Patients Only)       Balance Overall balance assessment: Needs assistance Sitting-balance support: No upper extremity supported;Feet supported Sitting balance-Leahy Scale: Fair     Standing balance support: Bilateral upper extremity supported;During functional activity Standing balance-Leahy Scale: Poor Standing balance comment: heavily reliant on external support                            Cognition Arousal/Alertness: Awake/alert Behavior During Therapy: WFL for tasks assessed/performed Overall Cognitive Status: Within Functional Limits for tasks assessed                                        Exercises General Exercises - Lower Extremity Long Arc Quad: Both;10 reps;Seated    General Comments        Pertinent Vitals/Pain Pain Assessment: 0-10 Pain Score: 6  Pain Location: groin area, back incisional  Pain Descriptors / Indicators: Discomfort;Operative site guarding;Pressure Pain Intervention(s): Monitored during session;Premedicated before session    Nicolas [name] expects to be discharged to:: Private residence Living Arrangements: Children(son) Available Help at Discharge: Family Type of Home: House Home Access: Ramped entrance  Home Layout: One level Home Equipment: Walker - 2 wheels;Tub bench;Wheelchair - power;Hospital bed;Adaptive equipment      Prior Function Level of Independence: Needs assistance   Gait / Transfers Assistance Needed: household ambulator with RW up to 70 feet, uses power chair in community ADL's / Homemaking Assistance Needed:  independent ADLs, pt does the cooking/washing dishes from power chair that lifts him up 4'     PT Goals (current goals can now be found in the care plan section) Acute Rehab PT Goals Patient Stated Goal: to get to rehab  Potential to Achieve Goals: Good Progress towards PT goals: Progressing toward goals    Frequency    Min 5X/week      PT Plan Current plan remains appropriate    Co-evaluation              AM-PAC PT "6 Clicks" Mobility   Outcome Measure  Help needed turning from your back to your side while in a flat bed without using bedrails?: A Little Help needed moving from lying on your back to sitting on the side of a flat bed without using bedrails?: A Little Help needed moving to and from a bed to a chair (including a wheelchair)?: A Lot Help needed standing up from a chair using your arms (e.g., wheelchair or bedside chair)?: A Lot Help needed to walk in hospital room?: A Lot Help needed climbing 3-5 steps with a railing? : Total 6 Click Score: 13    End of Session Equipment Utilized During Treatment: Gait belt Activity Tolerance: Patient tolerated treatment well Patient left: in chair;with call bell/phone within reach;with chair alarm set Nurse Communication: Mobility status PT Visit Diagnosis: Unsteadiness on feet (R26.81);Other abnormalities of gait and mobility (R26.89);Muscle weakness (generalized) (M62.81);Difficulty in walking, not elsewhere classified (R26.2);Pain     Time: 1131-1144 PT Time Calculation (min) (ACUTE ONLY): 13 min  Charges:  $Gait Training: 8-22 mins                     Ellamae Sia, [place], DPT Acute Rehabilitation Services Pager [phone] Office [phone]    Willy Eddy 11/13/2019, 1:10 PM

## 2019-11-13 NOTE — TOC Initial Note (Signed)
Transition of Care [facility]) - Initial/Assessment Note    Patient Details  Name: Nicolas [name] MRN: [id number] Date of Birth: 02-09-1966  Transition of Care [facility]) CM/SW Contact:    Pollie Friar, RN Phone Number: 11/13/2019, 2:22 PM  Clinical Narrative:                 Pt is not applicable for CIR d/t Observation status. CM met with him to go over the other options. Pt is agreeable to being faxed out for SNF rehab. FL2 completed and pt faxed out to SNF in [facility] area.  TOC following.  Expected Discharge Plan: Skilled Nursing Facility Barriers to Discharge: Continued Medical Work up   Patient Goals and CMS Choice   CMS Medicare.gov Compare Post Acute Care list provided to:: Patient Choice offered to / list presented to : Patient  Expected Discharge Plan and Services Expected Discharge Plan: [place] In-house Referral: Clinical Social Work Discharge Planning Services: CM Consult Post Acute Care Choice: [place]                                        Prior Living Arrangements/Services   Lives with:: Adult Children(son is 58) Patient language and need for interpreter reviewed:: Yes Do you feel safe going back to the place where you live?: Yes      Need for Family Participation in Patient Care: Yes (Comment) Care giver support system in place?: Yes (comment)(per patient--son is able to provide assistance) Current home services: DME(wheelchair/ walker/ shower seat/ 3 in 1/ motor wheelchair) Criminal Activity/Legal Involvement Pertinent to Current Situation/Hospitalization: No - Comment as needed  Activities of Daily Living Home Assistive Devices/Equipment: Bedside commode/3-in-1, Shower chair without back, Walker (specify type), Wheelchair, Blood pressure cuff, Built-in shower seat, Hospital bed, Raised toilet seat with rails, Transfer board ADL Screening (condition at time of admission) Patient's cognitive ability adequate to  safely complete daily activities?: Yes Is the patient deaf or have difficulty hearing?: No Does the patient have difficulty seeing, even when wearing glasses/contacts?: No Does the patient have difficulty concentrating, remembering, or making decisions?: No Patient able to express need for assistance with ADLs?: Yes Does the patient have difficulty dressing or bathing?: No Independently performs ADLs?: Yes (appropriate for developmental age) Does the patient have difficulty walking or climbing stairs?: Yes Weakness of Legs: Both Weakness of Arms/Hands: None  Permission Sought/Granted                  Emotional Assessment Appearance:: Appears stated age Attitude/Demeanor/Rapport: Engaged Affect (typically observed): Accepting, Pleasant Orientation: : Oriented to Self, Oriented to Place, Oriented to  Time, Oriented to Situation   Psych Involvement: No (comment)  Admission diagnosis:  Spinal stenosis of lumbar region with neurogenic claudication [M48.062] Patient Active Problem List   Diagnosis Date Noted  . Spinal stenosis of lumbar region with neurogenic claudication 11/11/2019  . Drug induced constipation   . Hyponatremia   . Labile blood glucose   . Diabetes mellitus type 2 in obese ([place])   . Morbid obesity ([place])   . Thoracic myelopathy 05/14/2019  . Cervical spondylosis with myelopathy 01/26/2019  . Class 3 severe obesity due to excess calories with serious comorbidity and body mass index (BMI) of 40.0 to 44.9 in adult ([place]) 10/10/2017  . Prediabetes 10/10/2017  . Debility 10/10/2017  . Able to mobilize using indoor motorized wheelchair  10/10/2017  . Frequent falls 02/01/2017  . At high risk for falls 03/12/2016  . Hypoxemia 05/23/2014  . Chronic anticoagulation 05/21/2014  . Spinal stenosis 12/02/2013  . Venous stasis dermatitis 09/26/2012  . Encounter for long-term (current) use of anticoagulants 03/26/2012  . Spinal stenosis of lumbar region 03/06/2012  . HTN  (hypertension) 03/06/2012  . Hx pulmonary embolism 03/06/2012  . Lower extremity edema 03/06/2012   PCP:  Shawnee Knapp, MD Pharmacy:   [facility] [place], [place] - [address] AT [address] [place] [address] Phone: [phone] Fax: [facility] [place], [place] [address] Phone: [phone] Fax: [phone]     Social Determinants of Health (SDOH) Interventions    Readmission Risk Interventions No flowsheet data found.

## 2019-11-13 NOTE — Progress Notes (Signed)
Inpatient Rehabilitation-Admissions Coordinator   CIR consult received. Noted pt is observation status at this time. Pt may not have the medical necessity to warrant an inpatient rehab stay if they remain observation. If status were to change to inpatient, [facility] will reevaluate for candidacy.    Please call if questions.   Raechel Ache, OTR/L  Rehab Admissions Coordinator  [phone] 11/13/2019 1:39 PM

## 2019-11-14 DIAGNOSIS — Z6841 Body Mass Index (BMI) 40.0 and over, adult: Secondary | ICD-10-CM | POA: Diagnosis not present

## 2019-11-14 DIAGNOSIS — Z833 Family history of diabetes mellitus: Secondary | ICD-10-CM | POA: Diagnosis not present

## 2019-11-14 DIAGNOSIS — Z86711 Personal history of pulmonary embolism: Secondary | ICD-10-CM | POA: Diagnosis not present

## 2019-11-14 DIAGNOSIS — I10 Essential (primary) hypertension: Secondary | ICD-10-CM | POA: Diagnosis present

## 2019-11-14 DIAGNOSIS — M5106 Intervertebral disc disorders with myelopathy, lumbar region: Secondary | ICD-10-CM | POA: Diagnosis present

## 2019-11-14 DIAGNOSIS — Z86718 Personal history of other venous thrombosis and embolism: Secondary | ICD-10-CM | POA: Diagnosis not present

## 2019-11-14 DIAGNOSIS — E1151 Type 2 diabetes mellitus with diabetic peripheral angiopathy without gangrene: Secondary | ICD-10-CM | POA: Diagnosis present

## 2019-11-14 DIAGNOSIS — Z7901 Long term (current) use of anticoagulants: Secondary | ICD-10-CM | POA: Diagnosis not present

## 2019-11-14 DIAGNOSIS — M5116 Intervertebral disc disorders with radiculopathy, lumbar region: Secondary | ICD-10-CM | POA: Diagnosis present

## 2019-11-14 DIAGNOSIS — G8220 Paraplegia, unspecified: Secondary | ICD-10-CM | POA: Diagnosis present

## 2019-11-14 DIAGNOSIS — Z20828 Contact with and (suspected) exposure to other viral communicable diseases: Secondary | ICD-10-CM | POA: Diagnosis present

## 2019-11-14 DIAGNOSIS — M545 Low back pain: Secondary | ICD-10-CM | POA: Diagnosis present

## 2019-11-14 DIAGNOSIS — Z79899 Other long term (current) drug therapy: Secondary | ICD-10-CM | POA: Diagnosis not present

## 2019-11-14 DIAGNOSIS — M48062 Spinal stenosis, lumbar region with neurogenic claudication: Secondary | ICD-10-CM | POA: Diagnosis present

## 2019-11-14 DIAGNOSIS — Z87891 Personal history of nicotine dependence: Secondary | ICD-10-CM | POA: Diagnosis not present

## 2019-11-14 DIAGNOSIS — E669 Obesity, unspecified: Secondary | ICD-10-CM | POA: Diagnosis present

## 2019-11-14 NOTE — Progress Notes (Signed)
Overall progressing well.  Patient's pain well controlled.  Still mobilizing slowly.  Right lower extremity pain is much improved but still has some weakness.  Wound clean and dry.  Chest and abdomen benign.  Overall progressing reasonably well.  Continue efforts at therapy and mobilization.  Possible inpatient rehab transfer

## 2019-11-14 NOTE — Progress Notes (Signed)
Physical Therapy Treatment Patient Details Name: Nicolas [name] MRN: [id number] DOB: 1966-10-24 Today's Date: 11/14/2019    History of Present Illness  Pt is a 53 y.o. M with significant PMH of clotting disorder, diabetes mellitus, DVT, PE, prior back surgeries. He presents s/p bilateral redo L2-3 and L3-4 laminectomy/foraminotomies/medial facetectomy to decompress the bilateral L3 and L4 nerve roots using microdissection 12/16.    PT Comments    Pt performed gt training and functional mobility with good motivation.  He is tolerating increased activity and presents with improved quad control.  He continues to require close chair follow as he fatigues quickly.  Pt continues to benefit from intense rehab at CIR to improve strength and function before returning home.  He has ability to gain independence at rehab before returning home.     Follow Up Recommendations  CIR     Equipment Recommendations  None recommended by PT    Recommendations for Other Services Rehab consult;OT consult     Precautions / Restrictions Precautions Precautions: Fall;Other (comment);Back Precaution Booklet Issued: No Precaution Comments: R knee buckle Other Brace: bilat AFO's Restrictions Weight Bearing Restrictions: No    Mobility  Bed Mobility Overal bed mobility: Needs Assistance Bed Mobility: Rolling;Sidelying to Sit Rolling: Min guard         General bed mobility comments: Pt required increased time and effort with heavy use of railing to rise to sitting.  Transfers Overall transfer level: Needs assistance Equipment used: Rolling walker (2 wheeled) Transfers: Sit to/from Stand Sit to Stand: Min assist;Min guard         General transfer comment: Increased time and effort.  Pt required min from bed and min guard from chair.  Slow and guarded to rise into standiing.  NOted to brace LEs against surface to achieve standing.  Ambulation/Gait Ambulation/Gait assistance: Min assist Gait  Distance (Feet): 20 Feet(x2, followed by 10 ft x3.  Seated rest break btw each trial 70 ft total.) Assistive device: Rolling walker (2 wheeled) Gait Pattern/deviations: Step-to pattern;Step-through pattern;Decreased stance time - right;Decreased dorsiflexion - right;Decreased dorsiflexion - left Gait velocity: decreased   General Gait Details: Pt requiring modA for stability, frequent right knee buckle but no overt LOB. Cues for sequencing, walker positioning, right knee extension in midstance. Pt with heavy reliance through arms on walker.   Stairs             Wheelchair Mobility    Modified Rankin (Stroke Patients Only)       Balance Overall balance assessment: Needs assistance Sitting-balance support: No upper extremity supported;Feet supported Sitting balance-Leahy Scale: Fair       Standing balance-Leahy Scale: Poor                              Cognition Arousal/Alertness: Awake/alert Behavior During Therapy: WFL for tasks assessed/performed Overall Cognitive Status: Within Functional Limits for tasks assessed                                        Exercises      General Comments        Pertinent Vitals/Pain Pain Assessment: 0-10 Pain Score: 3  Pain Location: back incisional Pain Descriptors / Indicators: Discomfort;Operative site guarding;Pressure Pain Intervention(s): Monitored during session;Repositioned    Home Living  Prior Function            PT Goals (current goals can now be found in the care plan section) Acute Rehab PT Goals Patient Stated Goal: to get to rehab  Potential to Achieve Goals: Good Progress towards PT goals: Progressing toward goals    Frequency    Min 5X/week      PT Plan Current plan remains appropriate    Co-evaluation              AM-PAC PT "6 Clicks" Mobility   Outcome Measure  Help needed turning from your back to your side while in a flat  bed without using bedrails?: A Little Help needed moving from lying on your back to sitting on the side of a flat bed without using bedrails?: A Little Help needed moving to and from a bed to a chair (including a wheelchair)?: A Lot Help needed standing up from a chair using your arms (e.g., wheelchair or bedside chair)?: A Lot Help needed to walk in hospital room?: A Lot Help needed climbing 3-5 steps with a railing? : Total 6 Click Score: 13    End of Session Equipment Utilized During Treatment: Gait belt Activity Tolerance: Patient tolerated treatment well Patient left: in chair;with call bell/phone within reach;with chair alarm set Nurse Communication: Mobility status PT Visit Diagnosis: Unsteadiness on feet (R26.81);Other abnormalities of gait and mobility (R26.89);Muscle weakness (generalized) (M62.81);Difficulty in walking, not elsewhere classified (R26.2);Pain Pain - part of body: (back)     Time: [id number] PT Time Calculation (min) (ACUTE ONLY): 29 min  Charges:  $Gait Training: 8-22 mins $Therapeutic Activity: 8-22 mins                     Nicolas [name] , PTA Acute Rehabilitation Services Pager [phone] Office [phone]     Nicolas [name] 11/14/2019, 1:55 PM

## 2019-11-15 MED ORDER — SENNA 8.6 MG PO TABS
2.0000 | ORAL_TABLET | Freq: Every day | ORAL | Status: DC | PRN
  Administered 2019-11-16: 17.2 mg via ORAL
  Filled 2019-11-15: qty 2

## 2019-11-15 MED ORDER — WARFARIN SODIUM 7.5 MG PO TABS
12.5000 mg | ORAL_TABLET | Freq: Once | ORAL | Status: AC
  Administered 2019-11-15: 12.5 mg via ORAL
  Filled 2019-11-15: qty 1

## 2019-11-15 MED ORDER — WARFARIN - PHARMACIST DOSING INPATIENT: Freq: Every day | Status: DC

## 2019-11-15 MED ORDER — ENOXAPARIN SODIUM 80 MG/0.8ML ~~LOC~~ SOLN
70.0000 mg | Freq: Every day | SUBCUTANEOUS | Status: DC
  Administered 2019-11-15 – 2019-11-16 (×2): 70 mg via SUBCUTANEOUS
  Filled 2019-11-15 (×2): qty 0.8

## 2019-11-15 NOTE — Progress Notes (Signed)
  NEUROSURGERY PROGRESS NOTE   No issues overnight.  Complains of appropriate back soreness, well controlled with current regimen. No change in motor exam  EXAM:  BP 116/64 (BP Location: Right Arm)   Pulse 79   Temp 98.6 F (37 C) (Oral)   Resp 18   Ht 5' 10.5" (1.791 m)   Wt (!) 140.6 kg   SpO2 97%   BMI 43.85 kg/m   Awake, alert, oriented  Speech fluent, appropriate  CN grossly intact  R>LLE weakness stable Incision: c/d/i  PLAN Doing well Continue supportive care Patient would like to pursue CIR. If not a candidate, declines SNF and would prefer HH. Will await word from CIR

## 2019-11-15 NOTE — Progress Notes (Signed)
ANTICOAGULATION CONSULT NOTE - Initial Consult  Pharmacy Consult for Coumadin Indication: h/o DVT, PEs  Allergies  Allergen Reactions  . Ace Inhibitors Cough  . Lisinopril Cough    Patient Measurements: Height: 5' 10.5" (179.1 cm) Weight: (!) 310 lb (140.6 kg) IBW/kg (Calculated) : 74.15  Vital Signs: Temp: 98.6 F (37 C) (12/20 0747) Temp Source: Oral (12/20 0747) BP: 116/64 (12/20 0747) Pulse Rate: 79 (12/20 0747)  Labs: No results for input(s): HGB, HCT, PLT, APTT, LABPROT, INR, HEPARINUNFRC, HEPRLOWMOCWT, CREATININE, CKTOTAL, CKMB, TROPONINIHS in the last 72 hours.  Estimated Creatinine Clearance: 154 mL/min (by C-G formula based on SCr of 0.65 mg/dL).   Medical History: Past Medical History:  Diagnosis Date  . Arthritis   . Clotting disorder ([place])    pulmonary embolus, DVT  . Depression   . Diabetes mellitus without complication (HCC)    no meds, watching diet  . DVT (deep venous thrombosis) ([place])    Summer 2012  . Hypertension   . Neuromuscular disorder ([place])    spinal stenosis.- pt walks with a walker short distance  . Peripheral vascular disease ([place])   . PONV (postoperative nausea and vomiting)   . Pulmonary embolism ([place])    x 3 in 2005, 2008, 2010  . Sleep apnea    does not wear a c-pap, pt. reports that he gave the CPAP back, denies that he has OSA   . Spinal stenosis     Assessment:  CC/HPI: 53 year old obese black male who has had a similar cervical thoracic myelopathy - redo lumbar laminectomy  PMH: H/o DVT 2012, PEs x 3 (2005, 2008, 2010) on warfarin, arthritis, depression, DM, HTN, spinal stenosis, PVD, OSA  Significant events: 12/16 bilateral redo L2-3 and L3-4 laminectomy/foraminotomies/medial facetectomy to decompress the bilateral L3 and L4 nerve roots  Anticoag: h/o DVT and PE's on Warfarin PTA . INR 2.4>0.9 today. S/p neurosurgery 12/16.  - PTA Coumadin 5mg  MWF, 10mg  all other days  Goal of Therapy:  INR 2-3 Monitor platelets by  anticoagulation protocol: Yes   Plan:  Via chat: Dr. Arnoldo Morale ok to start VTE prophx + Coumadin Started LMWH (0.5mg /kg/d) =70mg /d for VTE prophx. Did not elect for full dose with recent spinal surgery. Coumadin 12.5mg  po x 1 tonight Daily INR  [facility] [place], [employer] 11/15/2019,11:13 AM

## 2019-11-15 NOTE — TOC Progression Note (Signed)
Transition of Care (TOC) - Progression Note    Patient Details  Name: Hillman D Castrejon MRN: [id number] Date of Birth: 06/16/1966  Transition of Care (TOC) CM/SW Contact  Caitlin B Pinion, LCSWA Phone Number: 11/15/2019, 3:58 PM  Clinical Narrative:     CSW met with the patient at bedside to offer SNF choice. Patient declined going to SNF. He stated that he didn't want to catch COVID. He stated that he lives with his son and other family members could provide supervision/support. He stated he would like to use [facility] for his home health agency. He stated he has used them in the past and would like to continue with their services. Patient also stated that he has all DME at home but did not specify what he had. CSW stated she would contact [facility] to see if they could coordinate home health services.  CSW thanked him for his time.   CSW called Katina with [facility]. CSW left a message asking for a return phone call. CSW is awaiting a call back.   CSW will continue to follow and assist with disposition planning.   Expected Discharge Plan: Home w Home Health Services Barriers to Discharge: Continued Medical Work up  Expected Discharge Plan and Services Expected Discharge Plan: Home w Home Health Services In-house Referral: Clinical Social Work Discharge Planning Services: CM Consult Post Acute Care Choice: Skilled Nursing Facility                             HH Arranged: PT, OT, Nurse's Aide           Social Determinants of Health (SDOH) Interventions    Readmission Risk Interventions No flowsheet data found.  

## 2019-11-16 ENCOUNTER — Encounter: Payer: Medicare HMO | Admitting: Physical Medicine and Rehabilitation

## 2019-11-16 LAB — PROTIME-INR
INR: 1 (ref 0.8–1.2)
Prothrombin Time: 12.9 seconds (ref 11.4–15.2)

## 2019-11-16 MED ORDER — OXYCODONE HCL 10 MG PO TABS: 5.0000 mg | ORAL_TABLET | ORAL | 0 refills | Status: DC | PRN

## 2019-11-16 MED ORDER — DOCUSATE SODIUM 100 MG PO CAPS: 100.0000 mg | ORAL_CAPSULE | Freq: Two times a day (BID) | ORAL | 0 refills | Status: DC

## 2019-11-16 MED ORDER — WARFARIN SODIUM 5 MG PO TABS
10.0000 mg | ORAL_TABLET | Freq: Once | ORAL | Status: AC
  Administered 2019-11-16: 17:00:00 10 mg via ORAL
  Filled 2019-11-16: qty 2

## 2019-11-16 MED ORDER — WARFARIN SODIUM 7.5 MG PO TABS: 12.5000 mg | ORAL_TABLET | Freq: Once | ORAL | Status: DC

## 2019-11-16 MED ORDER — CYCLOBENZAPRINE HCL 10 MG PO TABS: 10.0000 mg | ORAL_TABLET | Freq: Three times a day (TID) | ORAL | 0 refills | Status: DC | PRN

## 2019-11-16 NOTE — Progress Notes (Signed)
Physical Therapy Treatment Patient Details Name: Nicolas [name] MRN: [id number] DOB: [birth year] Today's Date: 11/16/2019    History of Present Illness  Pt is a 53 y.o. M with significant PMH of clotting disorder, diabetes mellitus, DVT, PE, prior back surgeries. He presents s/p bilateral redo L2-3 and L3-4 laminectomy/foraminotomies/medial facetectomy to decompress the bilateral L3 and L4 nerve roots using microdissection 12/16.    PT Comments    Pt performed x3 bouts of gt training.  He is requiring decreased assistance with sit to stand and limited due to pain and fatigue with gt training.  He is refusing CIR placement and choosing to discharge home with his son for assistance.   .  Follow Up Recommendations  CIR     Equipment Recommendations  None recommended by PT    Recommendations for Other Services Rehab consult;OT consult     Precautions / Restrictions Precautions Precautions: Fall;Other (comment);Back Precaution Booklet Issued: No Precaution Comments: R knee buckle Required Braces or Orthoses: Other Brace Other Brace: bilat AFO's    Mobility  Bed Mobility Overal bed mobility: Needs Assistance Bed Mobility: Rolling;Sit to Sidelying Rolling: Min guard       Sit to sidelying: Min guard General bed mobility comments: assistance for back to bed.  Transfers Overall transfer level: Needs assistance Equipment used: Rolling walker (2 wheeled) Transfers: Sit to/from Stand Sit to Stand: Min assist         General transfer comment: Cues for hand placement.  Pt with better ability to achieve standing and has some moments of min guard assistance.  Ambulation/Gait Ambulation/Gait assistance: Min assist;Mod assist Gait Distance (Feet): 20 Feet(+ 10 ft + 30 ft)   Gait Pattern/deviations: Step-to pattern;Step-through pattern;Decreased stance time - right;Decreased dorsiflexion - right;Decreased dorsiflexion - left Gait velocity: decreased   General Gait Details: Pt  requiring modA for stability, frequent right knee buckle but no overt LOB. Cues for sequencing, walker positioning, right knee extension in midstance. Pt with heavy reliance through arms on walker.   Stairs             Wheelchair Mobility    Modified Rankin (Stroke Patients Only)       Balance Overall balance assessment: Needs assistance Sitting-balance support: No upper extremity supported;Feet supported Sitting balance-Leahy Scale: Fair       Standing balance-Leahy Scale: Poor                              Cognition Arousal/Alertness: Awake/alert Behavior During Therapy: WFL for tasks assessed/performed Overall Cognitive Status: Within Functional Limits for tasks assessed                                        Exercises      General Comments        Pertinent Vitals/Pain Pain Assessment: Faces Pain Score: 9  Faces Pain Scale: Hurts little more Pain Location: back incisional Pain Descriptors / Indicators: Discomfort;Guarding;Grimacing Pain Intervention(s): Monitored during session;Repositioned;Patient requesting pain meds-RN notified    Home Living                      Prior Function            PT Goals (current goals can now be found in the care plan section) Acute Rehab PT Goals Patient Stated Goal: home today to be with  son Potential to Achieve Goals: Good Progress towards PT goals: Progressing toward goals    Frequency    Min 5X/week      PT Plan Current plan remains appropriate    Co-evaluation              AM-PAC PT "6 Clicks" Mobility   Outcome Measure  Help needed turning from your back to your side while in a flat bed without using bedrails?: A Little Help needed moving from lying on your back to sitting on the side of a flat bed without using bedrails?: A Little Help needed moving to and from a bed to a chair (including a wheelchair)?: A Little Help needed standing up from a chair using  your arms (e.g., wheelchair or bedside chair)?: A Little Help needed to walk in hospital room?: A Lot Help needed climbing 3-5 steps with a railing? : A Lot 6 Click Score: 16    End of Session Equipment Utilized During Treatment: Gait belt Activity Tolerance: Patient tolerated treatment well Patient left: in chair;with call bell/phone within reach;with chair alarm set Nurse Communication: Mobility status PT Visit Diagnosis: Unsteadiness on feet (R26.81);Other abnormalities of gait and mobility (R26.89);Muscle weakness (generalized) (M62.81);Difficulty in walking, not elsewhere classified (R26.2);Pain     Time: [id number] PT Time Calculation (min) (ACUTE ONLY): 19 min  Charges:  $Gait Training: 8-22 mins                     Erasmo Leventhal , PTA Acute Rehabilitation Services Pager [phone] Office [phone]     Kerissa Coia Eli Hose 11/16/2019, 6:33 PM

## 2019-11-16 NOTE — Progress Notes (Signed)
[facility] for warfarin Indication: h/o DVT, PEs   Patient Measurements: Height: 5\' 10"  (177.8 cm) Weight: (!) 310 lb 13.6 oz (141 kg) IBW/kg (Calculated) : 73  Vital Signs: Temp: 98.5 F (36.9 C) (12/21 0415) Temp Source: Oral (12/21 0415) BP: 139/66 (12/21 0415) Pulse Rate: 83 (12/21 0415)  Labs: No results for input(s): HGB, HCT, PLT, APTT, LABPROT, INR, HEPARINUNFRC, HEPRLOWMOCWT, CREATININE, CKTOTAL, CKMB, TROPONINIHS in the last 72 hours.    Medical History: Past Medical History:  Diagnosis Date  . Arthritis   . Clotting disorder ([place])    pulmonary embolus, DVT  . Depression   . Diabetes mellitus without complication (HCC)    no meds, watching diet  . DVT (deep venous thrombosis) ([place])    Summer 2012  . Hypertension   . Neuromuscular disorder ([place])    spinal stenosis.- pt walks with a walker short distance  . Peripheral vascular disease ([place])   . PONV (postoperative nausea and vomiting)   . Pulmonary embolism ([place])    x 3 in 2005, 2008, 2010  . Sleep apnea    does not wear a c-pap, pt. reports that he gave the CPAP back, denies that he has OSA   . Spinal stenosis     Assessment:  52 year old male with decompressive back surgery, now POD #5. He is on warfarin prior to admission for hx of PE and DVT. His outpatient regimen is warfarin 5 mg on Mon/Wed/Fri, 10 mg on all other days. INR remains 1 as expected after only restarting warfarin last night.    Goal of Therapy:  INR 2-3 Monitor platelets by anticoagulation protocol: Yes    Plan:  -Warfarin 10 mg po x1 -Daily INR -Continue LMWH (0.5mg /kg/d) =70mg /d for VTE prophx -Discontinue Lovenox when INR is therapeutic   Harvel Quale 11/16/2019,7:28 AM

## 2019-11-16 NOTE — Discharge Instructions (Signed)
Wound Care Keep incision covered and dry for two days.    Do not put any creams, lotions, or ointments on incision. Leave steri-strips on back.  They will fall off by themselves.  Activity Walk each and every day, increasing distance each day. No lifting greater than 5 lbs.  No driving for 2 weeks; may ride as a passenger locally.  Diet Resume your normal diet.    Call Your Doctor If Any of These Occur Redness, drainage, or swelling at the wound.  Temperature greater than 101 degrees. Severe pain not relieved by pain medication. Incision starts to come apart.  Follow Up Appt Call today for appointment in 1-2 weeks ([phone]) or for problems.  If you have any hardware placed in your spine, you will need an x-ray before your appointment.  

## 2019-11-16 NOTE — Progress Notes (Signed)
Inpatient Rehabilitation-Admissions Coordinator   Was contacted by [facility] team that pt's class has been changed to inpatient status and requested we see if pt is a candidate. I went to talk to patient regarding program details and options. He prefers to return home with home health therapy rather than IP Rehab program at this time. He feels he has the support at home for a safe DC.   AC has notified TOC team. AC will sign off.   Please call if questions.   Raechel Ache, OTR/L  Rehab Admissions Coordinator  [phone] 11/16/2019 10:41 AM

## 2019-11-16 NOTE — Progress Notes (Signed)
Pt discharge education and instructions completed with pt and pt voices understanding and denies any questions. Pt back incision dsg remains clean, dry and intact with no stain or active bleeding noted. Pt handed his discharge papers and pt to pick up electronically sent prescriptions from preferred pharmacy on file. Pt transported off unit via wheelchair with belongings to the side. Delia Heady RN

## 2019-11-16 NOTE — Discharge Summary (Signed)
Physician Discharge Summary  Patient ID: ARSHAAN HOSTLER MRN: [id number] DOB/AGE: 04-18-1966 53 y.o.  Admit date: 11/11/2019 Discharge date: 11/16/2019  Admission Diagnoses: Spinal stenosis of lumbar region with neurogenic claudication  Discharge Diagnoses:  Active Problems:   Spinal stenosis of lumbar region with neurogenic claudication   Discharged Condition: good  Hospital Course: Patient was admitted following bilateral redo L2-3 and L3-4 laminectomy/foraminotomies/medial facetectomy to decompress the bilateral L3 and L4 nerve roots. He initially was being considered for CIR, but has progressed to the point that he feels he is able to discharge home with home health PT and OT. His pain is well-controlled. He is transitioning back to Coumadin and has an appointment set up for 11/17/2019 with the Coumadin clinic  Consults: rehabilitation medicine  Significant Diagnostic Studies: radiology: DG Lumbar Spine 2-3 Views  Result Date: 11/11/2019 CLINICAL DATA:  L3-4 laminectomy EXAM: LUMBAR SPINE - 2-3 VIEW COMPARISON:  MRI 10/26/2019 FINDINGS: First lateral intraoperative image demonstrates posterior surgical instrument directed at the L2 vertebral body. Second intraoperative image demonstrates posterior surgical instrument directed at the L3 vertebral body. Third lateral intraoperative image demonstrates posterior surgical instruments directed at the L2 and L4 vertebral bodies. IMPRESSION: Localization as above. Electronically Signed   By: Rolm Baptise M.D.   On: 11/11/2019 17:43   MR LUMBAR SPINE WO CONTRAST  Result Date: 10/26/2019 CLINICAL DATA:  Severe back pain. Back surgery 05/14/2019 EXAM: MRI LUMBAR SPINE WITHOUT CONTRAST TECHNIQUE: Multiplanar, multisequence MR imaging of the lumbar spine was performed. No intravenous contrast was administered. COMPARISON:  Lumbar spine CT 05/14/2019. Lumbar MRI 12/01/2018 FINDINGS: Segmentation:  Normal segmentation Image quality degraded by motion  and large patient size. The patient was in severe pain and not able to perform axial T1 images. Alignment:  2 mm anterolisthesis L3-4. 2 mm retrolisthesis L4-5. Vertebrae: Negative for fracture or mass. No evidence of discitis osteomyelitis. Conus medullaris and cauda equina: Conus extends to the L1-2 level. Conus and cauda equina appear normal. Paraspinal and other soft tissues: Negative for paraspinous mass or adenopathy. Postop soft tissue edema related to laminectomy L2 and L3. No fluid collection. Disc levels: L1-2: Disc and facet degeneration. Mild spinal stenosis. Moderate subarticular and foraminal stenosis bilaterally L2-3: Bilateral laminectomy. Bilateral facet hypertrophy. Mild spinal stenosis. Moderate subarticular stenosis bilaterally L3-4: Bilateral laminectomy. Marked facet hypertrophy bilaterally. Severe spinal stenosis due to facet hypertrophy. In addition, there is a probable synovial cyst on the right contributing to stenosis. Suboptimal soft tissue detail. Severe subarticular and foraminal stenosis bilaterally L4-5: Remote laminectomy. Bilateral facet hypertrophy. Moderate subarticular stenosis bilaterally L5-S1: Disc and facet degeneration. Moderate subarticular and foraminal stenosis bilaterally. IMPRESSION: 1. Image quality degraded by motion and large patient size. 2. Bilateral laminectomy L2-3 with mild spinal stenosis. Moderate subarticular stenosis bilaterally. 3. Severe spinal stenosis L3-4 with severe subarticular and foraminal stenosis bilaterally. Probable synovial cyst on the right contributes to stenosis. 4. Remote laminectomy L4-5. 5. Moderate subarticular stenosis bilaterally L5-S1. Electronically Signed   By: Franchot Gallo M.D.   On: 10/26/2019 16:09     Treatments: surgery:  Bilateral redo L2-3 and L3-4 laminectomy/foraminotomies/medial facetectomy to decompress the bilateral L3 and L4 nerve roots using microdissection  Discharge Exam: Blood pressure 128/72, pulse 93,  temperature 99.2 F (37.3 C), temperature source Oral, resp. rate 18, height 5\' 10"  (1.778 m), weight (!) 141 kg, SpO2 98 %.   Alert and oriented x 4 PERRLA CN II-XII grossly intact RLE 3/5; RUE, LUE, LLE 5/5 Sensation grossly intact Incision is  covered with Honeycomb dressing and Steri Strips; Dressing is clean, dry, and intact  Disposition:    Allergies as of 11/16/2019      Reactions   Ace Inhibitors Cough   Lisinopril Cough      Medication List    TAKE these medications   acetaminophen 325 MG tablet Commonly known as: TYLENOL Take 2 tablets (650 mg total) by mouth every 4 (four) hours as needed for mild pain ((score 1 to 3) or temp > 100.5).   baclofen 10 MG tablet Commonly known as: LIORESAL Take 0.5 tablets (5 mg total) by mouth at bedtime.   cyclobenzaprine 10 MG tablet Commonly known as: FLEXERIL Take 1 tablet (10 mg total) by mouth 3 (three) times daily as needed for muscle spasms.   docusate sodium 100 MG capsule Commonly known as: COLACE Take 1 capsule (100 mg total) by mouth 2 (two) times daily.   enoxaparin 150 MG/ML injection Commonly known as: Lovenox Inject 1 mL (150 mg total) into the skin every 12 (twelve) hours.   furosemide 40 MG tablet Commonly known as: LASIX TAKE 1 TABLET EVERY DAY, NEED MD APPOINTMENT WITH CARDS FOR REFILLS What changed: See the new instructions.   gabapentin 300 MG capsule Commonly known as: NEURONTIN Take 1 capsule (300 mg total) by mouth 3 (three) times daily.   losartan 50 MG tablet Commonly known as: COZAAR Take 1 tablet (50 mg total) by mouth daily.   metoprolol succinate 50 MG 24 hr tablet Commonly known as: TOPROL-XL Take 1.5 tablets (75 mg total) by mouth once a day What changed:   how much to take  how to take this  when to take this   multivitamin with minerals Tabs tablet Take 1 tablet by mouth daily.   Oxycodone HCl 10 MG Tabs Take 0.5-1 tablets (5-10 mg total) by mouth every 4 (four) hours as  needed for severe pain ((score 7 to 10)).   potassium chloride SA 20 MEQ tablet Commonly known as: KLOR-CON TAKE 1 TABLET EVERY DAY, NEED MD APPOINTMENT WITH CARDS FOR REFILLS What changed: See the new instructions.   warfarin 10 MG tablet Commonly known as: COUMADIN Take as directed. If you are unsure how to take this medication, talk to your nurse or doctor. Original instructions: TAKE 1 TABLET DAILY EXCEPT 1/2 TABLET ON Monday, Wednesday, Friday OR AS DIRECTED BY COUMADIN CLINIC.            Durable Medical Equipment  (From admission, onward)         Start     Ordered   11/16/19 1457  For home use only DME 3 n 1  Once     11/16/19 1509           Signed: Patricia Nettle 11/16/2019, 2:36 PM

## 2019-11-16 NOTE — TOC Transition Note (Signed)
Transition of Care [facility]) - CM/SW Discharge Note   Patient Details  Name: Nicolas [name] MRN: [id number] Date of Birth: Aug 20, 1966  [facility] CM/SW Contact:  Pollie Friar, RN Phone Number: 11/16/2019, 3:39 PM   Clinical Narrative:    Pt discharging home with [facility] services. [facility] is not able to accept the referral at this time. CM updated the patient and [facility] selected. Information on the AVS.  Pt has assistance from his son at home and transportation home. Pt states he has all needed DME at home.   Final next level of care: Home w Home Health Services Barriers to Discharge: No Barriers Identified   Patient Goals and CMS Choice   CMS Medicare.gov Compare Post Acute Care list provided to:: Patient Choice offered to / list presented to : Patient  Discharge Placement                       Discharge Plan and Services In-house Referral: Clinical Social Work Discharge Planning Services: CM Consult Post Acute Care Choice: Skilled Nursing Facility                    HH Arranged: PT, OT, Nurse's Aide [facility] Agency: [facility] Date [facility] Agency Contacted: 11/16/19   Representative spoke with at [place]: [place] Interventions     Readmission Risk Interventions No flowsheet data found.

## 2019-11-16 NOTE — Progress Notes (Signed)
Occupational Therapy Treatment Patient Details Name: Nicolas [name] MRN: [id number] DOB: 09/12/1966 Today's Date: 11/16/2019    History of present illness  Pt is a 53 y.o. M with significant PMH of clotting disorder, diabetes mellitus, DVT, PE, prior back surgeries. He presents s/p bilateral redo L2-3 and L3-4 laminectomy/foraminotomies/medial facetectomy to decompress the bilateral L3 and L4 nerve roots using microdissection 12/16.   OT comments  Pt progressing towards acute OT goals. Focus of session was toilet transfer and toileting tasks. Able to walk recliner to/from bathroom. 3n1 over toilet utilized. Min A for functional transfers and mobility. 1 LOB walking to bathroom with L knee appearing to buckle, pt able to self correct. Pt with heavy reliance on rw, extra time and effort for OOB tasks. D/c plan updated to [facility] and 24 hour supervision to reflect pt's desire to d/c directly home.   Follow Up Recommendations  Home health OT;Supervision/Assistance - 24 hour(OOB/mobility. Pt declined CIR. )    Equipment Recommendations  3 in 1 bedside commode    Recommendations for Other Services      Precautions / Restrictions Precautions Precautions: Fall;Other (comment);Back Precaution Booklet Issued: No Precaution Comments: R knee buckle Required Braces or Orthoses: Other Brace Other Brace: bilat AFO's Restrictions Weight Bearing Restrictions: No       Mobility Bed Mobility               General bed mobility comments: up in recliner  Transfers Overall transfer level: Needs assistance Equipment used: Rolling walker (2 wheeled) Transfers: Sit to/from Stand Sit to Stand: Min assist;Min guard         General transfer comment: Increased time and effort. Heavy reliance on rw/external support. to/from recliner and 3n1 over toielt    Balance Overall balance assessment: Needs assistance Sitting-balance support: No upper extremity supported;Feet supported Sitting  balance-Leahy Scale: Fair     Standing balance support: Bilateral upper extremity supported;During functional activity Standing balance-Leahy Scale: Poor Standing balance comment: heavily reliant on external support                           ADL either performed or assessed with clinical judgement   ADL Overall ADL's : Needs assistance/impaired                         Toilet Transfer: Minimal assistance;Ambulation;RW Toilet Transfer Details (indicate cue type and reason): 3n1 over toilet. Pt walked from recliner to bathroom at min A level. 1 LOB towards left (L knee appeared to buckle) with pt able to self correct.          Functional mobility during ADLs: Minimal assistance;Rolling walker General ADL Comments: Pt walked to/from bathroom at min A level, heavy reliance on rw. Toilet transfer and pericare. Pt reports teenage son able to assist at home. Recommended 3n1 over toilet.     Vision       Perception     Praxis      Cognition Arousal/Alertness: Awake/alert Behavior During Therapy: WFL for tasks assessed/performed Overall Cognitive Status: Within Functional Limits for tasks assessed                                          Exercises     Shoulder Instructions       General Comments      Pertinent  Vitals/ Pain       Pain Assessment: Faces Faces Pain Scale: Hurts little more Pain Location: back incisional Pain Descriptors / Indicators: Discomfort;Guarding;Grimacing Pain Intervention(s): Monitored during session;Repositioned  Home Living                                          Prior Functioning/Environment              Frequency  Min 2X/week        Progress Toward Goals  OT Goals(current goals can now be found in the care plan section)  Progress towards OT goals: Progressing toward goals  Acute Rehab OT Goals Patient Stated Goal: home today to be with son OT Goal Formulation: With  patient Time For Goal Achievement: 11/27/19 Potential to Achieve Goals: Good ADL Goals Pt Will Perform Lower Body Dressing: with min assist;with adaptive equipment;sit to/from stand Pt Will Transfer to Toilet: with min guard assist;stand pivot transfer;ambulating;bedside commode Pt Will Perform Toileting - Clothing Manipulation and hygiene: with supervision;sit to/from stand;sitting/lateral leans Additional ADL Goal #1: Pt will complete 2 grooming tasks standing at sink with min guard assist in order to increase tolerance to standing tasks.  Plan Discharge plan needs to be updated    Co-evaluation                 AM-PAC OT "6 Clicks" Daily Activity     Outcome Measure   Help from another person eating meals?: None Help from another person taking care of personal grooming?: A Little Help from another person toileting, which includes using toliet, bedpan, or urinal?: A Little Help from another person bathing (including washing, rinsing, drying)?: A Little Help from another person to put on and taking off regular upper body clothing?: A Little Help from another person to put on and taking off regular lower body clothing?: A Little 6 Click Score: 19    End of Session Equipment Utilized During Treatment: Rolling walker  OT Visit Diagnosis: Other abnormalities of gait and mobility (R26.89);Muscle weakness (generalized) (M62.81);Pain   Activity Tolerance Patient tolerated treatment well   Patient Left in chair;with call bell/phone within reach   Nurse Communication          Time: [id number] OT Time Calculation (min): 16 min  Charges: OT General Charges $OT Visit: 1 Visit OT Treatments $Self Care/Home Management : 8-22 mins  Tyrone Schimke, OT Acute Rehabilitation Services Pager: [phone] Office: [phone]    [name] 11/16/2019, 2:01 PM

## 2019-11-19 ENCOUNTER — Other Ambulatory Visit: Payer: Self-pay

## 2019-11-19 ENCOUNTER — Ambulatory Visit (INDEPENDENT_AMBULATORY_CARE_PROVIDER_SITE_OTHER): Payer: Medicare HMO | Admitting: Pharmacist

## 2019-11-19 DIAGNOSIS — R600 Localized edema: Secondary | ICD-10-CM

## 2019-11-19 DIAGNOSIS — Z86711 Personal history of pulmonary embolism: Secondary | ICD-10-CM | POA: Diagnosis not present

## 2019-11-19 DIAGNOSIS — Z7901 Long term (current) use of anticoagulants: Secondary | ICD-10-CM | POA: Diagnosis not present

## 2019-11-19 LAB — POCT INR: INR: 1.6 — AB (ref 2.0–3.0)

## 2019-11-19 NOTE — Patient Instructions (Signed)
Description   Take 1.5 tablets today and 1 tablet tomorrow, then continue taking 1 tablet daily except 1/2 tablet on Mondays, Wednesdays and Fridays. Finish using your remaining Lovenox shots for the next 2-3 days, then stop when you run out. Recheck INR next Tuesday. Call us with any changes # [phone].

## 2019-11-20 DIAGNOSIS — I10 Essential (primary) hypertension: Secondary | ICD-10-CM | POA: Diagnosis not present

## 2019-11-20 DIAGNOSIS — M4714 Other spondylosis with myelopathy, thoracic region: Secondary | ICD-10-CM | POA: Diagnosis not present

## 2019-11-20 DIAGNOSIS — M48061 Spinal stenosis, lumbar region without neurogenic claudication: Secondary | ICD-10-CM | POA: Diagnosis not present

## 2019-11-20 DIAGNOSIS — M5416 Radiculopathy, lumbar region: Secondary | ICD-10-CM | POA: Diagnosis not present

## 2019-11-20 DIAGNOSIS — M4712 Other spondylosis with myelopathy, cervical region: Secondary | ICD-10-CM | POA: Diagnosis not present

## 2019-11-20 DIAGNOSIS — F329 Major depressive disorder, single episode, unspecified: Secondary | ICD-10-CM | POA: Diagnosis not present

## 2019-11-20 DIAGNOSIS — Z4789 Encounter for other orthopedic aftercare: Secondary | ICD-10-CM | POA: Diagnosis not present

## 2019-11-20 DIAGNOSIS — G4730 Sleep apnea, unspecified: Secondary | ICD-10-CM | POA: Diagnosis not present

## 2019-11-20 DIAGNOSIS — E1151 Type 2 diabetes mellitus with diabetic peripheral angiopathy without gangrene: Secondary | ICD-10-CM | POA: Diagnosis not present

## 2019-11-21 IMAGING — CT CT THORACIC SPINE WITHOUT CONTRAST
3 of 6 series · 11 of 33 positions shown, 13 images · non-contrast
Comparison: MRI thoracic spine 01/14/2019

CLINICAL DATA: Thoracic laminectomy today. Severe postoperative
pain.

EXAM:
CT THORACIC SPINE WITHOUT CONTRAST
TECHNIQUE: Multidetector CT images of the thoracic were obtained using the
standard protocol without intravenous contrast.

[Series 8: sag bone · sagittal · 0.51mm/px · 5 of 74 slices shown]
[im 13/74  bone]
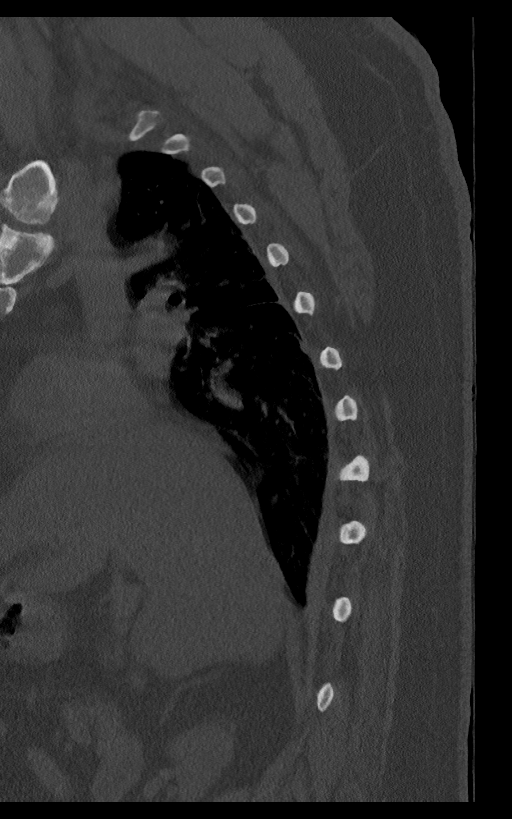
[im 25/74  bone]
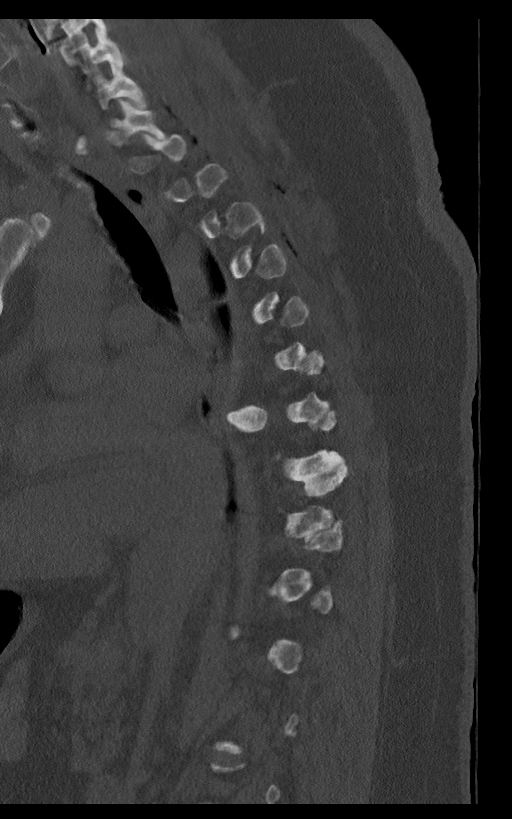
[im 37/74  bone]
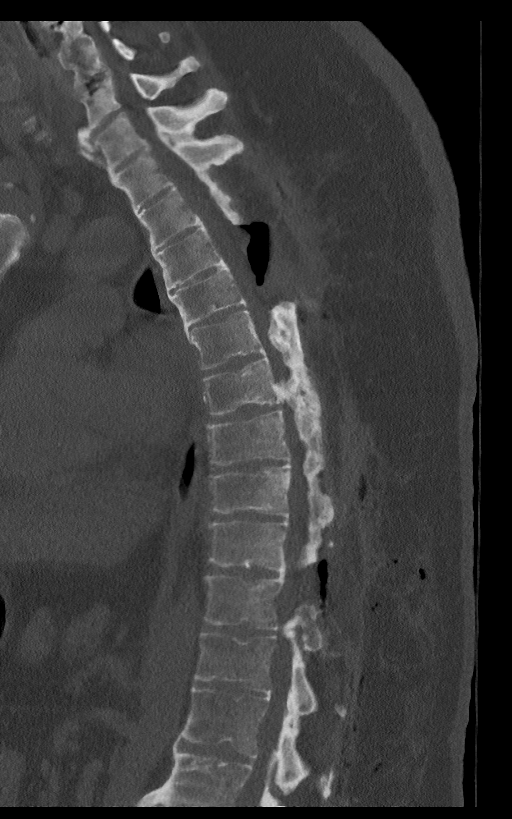
[im 49/74  bone]
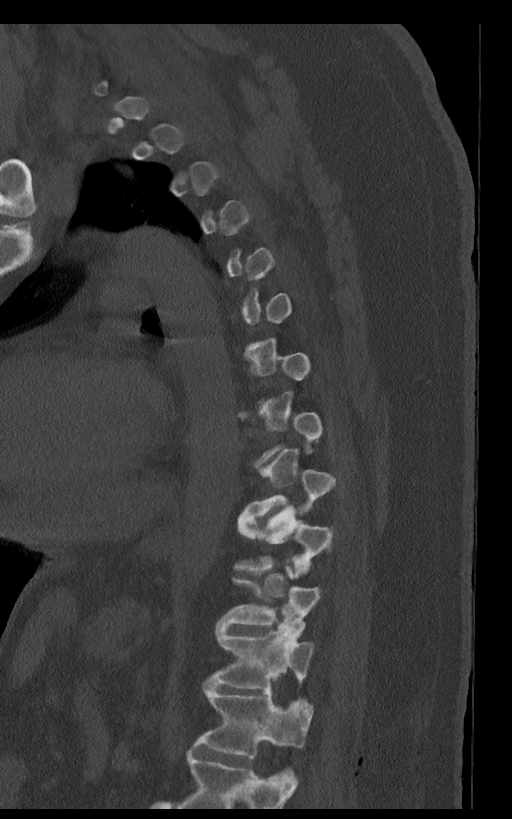
[im 61/74  bone]
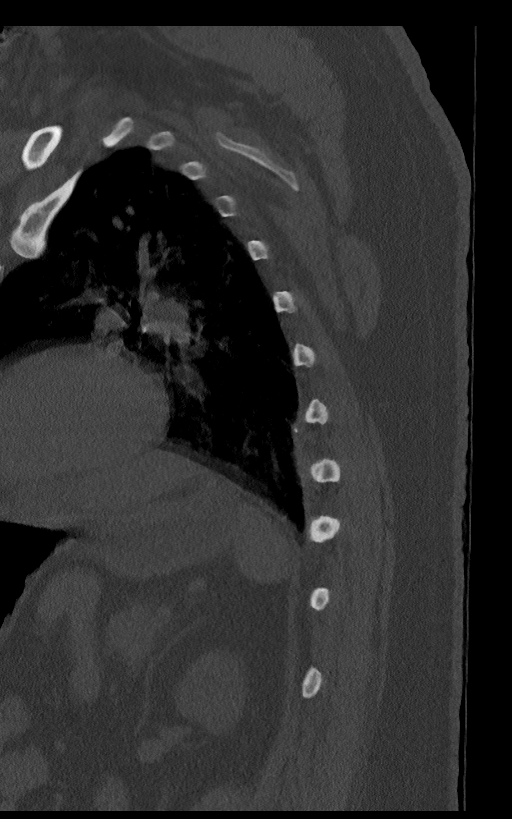

[Series 9: cor bone · coronal · 0.35mm/px · 1 of 126 slices shown]
[im 63/126  bone]
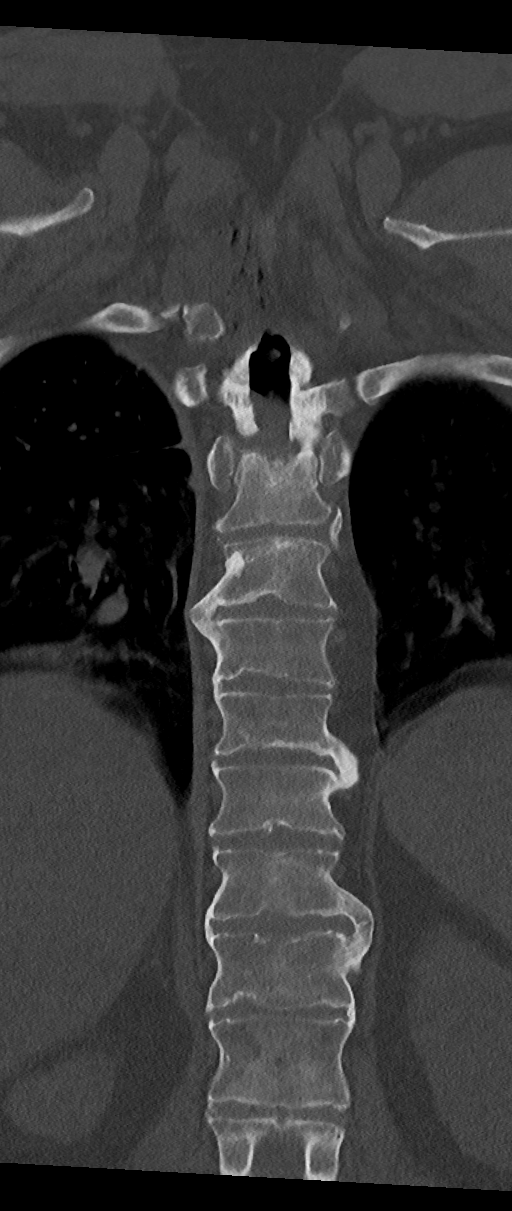

[Series 10: orthogonal bone · axial · 0.21mm/px · z∈[+1014,+1255]mm · 5 of 216 slices shown, 7 images]
[im 36/216  soft-tissue]
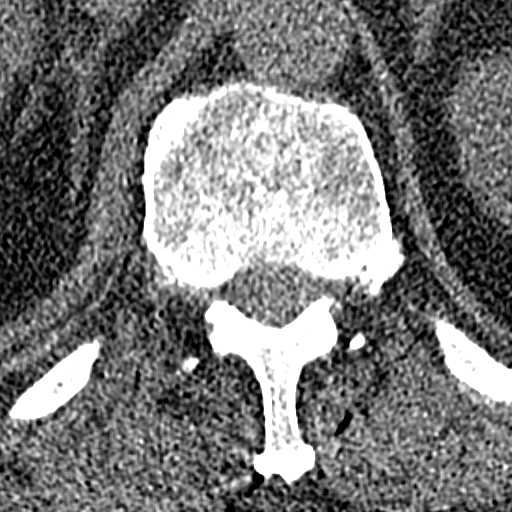
[im 36/216  bone]
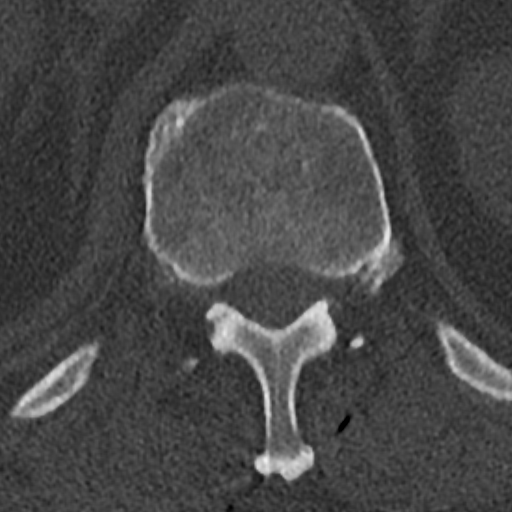
[im 72/216  bone]
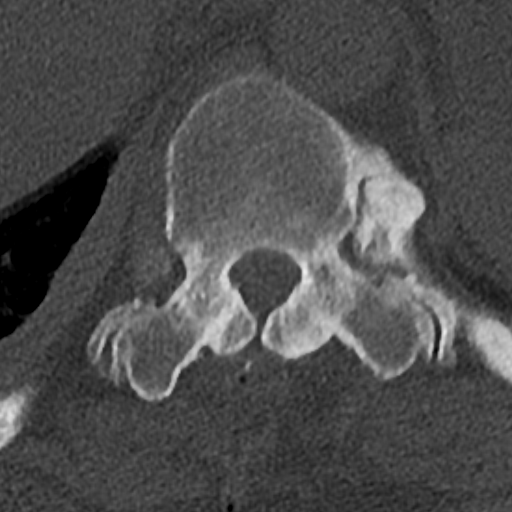
[im 108/216  bone]
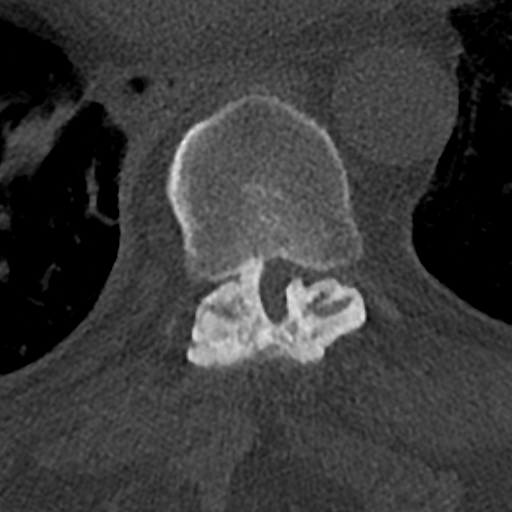
[im 144/216  bone]
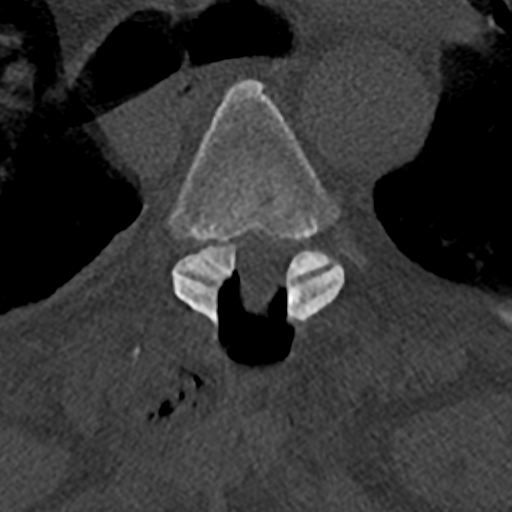
[im 180/216  soft-tissue]
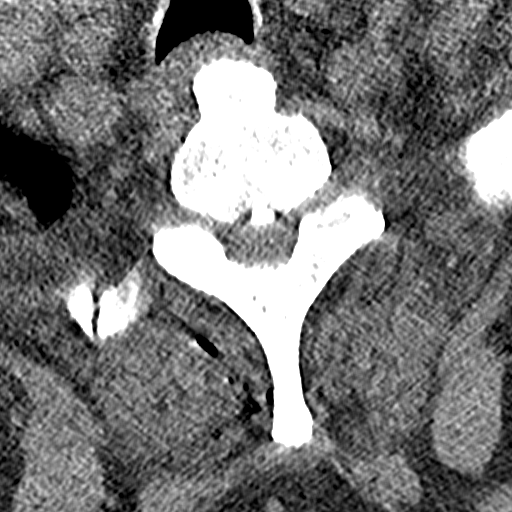
[im 180/216  bone]
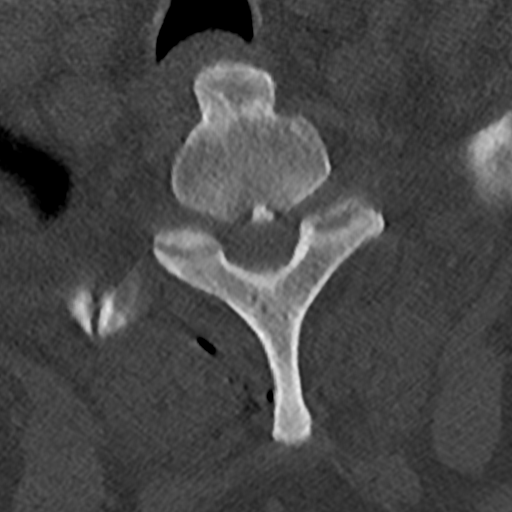

[11 of 33 positions shown; findings below may reference images not displayed]

FINDINGS: Alignment: Normal

Vertebrae: No vertebral fracture or mass.

Paraspinal and other soft tissues: Postop laminectomy at T4 and T5
with gas in the soft tissues as expected.

Postop laminectomy T10-11 and T11-T12 with gas in the soft tissues
as expected. No postoperative hematoma or fluid collection.

Visualized lungs clear without infiltrate or effusion.

Disc levels: C7-T1: Bilateral facet degeneration with mild foraminal
narrowing bilaterally and mild spinal stenosis

T1-2: Moderate foraminal stenosis bilaterally due to facet
hypertrophy and vertebral spurring. Mild spinal stenosis

T2-3: Severe facet degeneration and severe foraminal encroachment
bilaterally. Spinal canal not significantly narrowed

T3-4: Severe facet hypertrophy bilaterally. Severe foraminal
encroachment on the right and mild foraminal encroachment on the
left.

T4-5: Posterior decompression. Severe foraminal encroachment on the
right due to facet hypertrophy. Mild left foraminal stenosis.

T5-6: Posterior decompression without significant spinal stenosis.
Severe right foraminal stenosis and moderate left foraminal
encroachment due to facet hypertrophy.

T6-7: Severe facet degeneration. Complete obliteration of the right
foramen and severe left foraminal stenosis. Moderate to severe
spinal stenosis.

T7-8: Severe facet hypertrophy. Severe right foraminal encroachment
and moderate to severe left foraminal encroachment. Moderate spinal
stenosis.

T8-9: Chronic laminectomy with bony overgrowth of the facets. Severe
foraminal encroachment bilaterally. Mild spinal stenosis.

T9-10: Chronic laminectomy with bony overgrowth the posterior
elements. Severe facet hypertrophy bilaterally with severe foraminal
stenosis bilaterally

T10-11: Recent laminectomy with gas in the soft tissues. Severe
facet hypertrophy causing severe foraminal encroachment bilaterally.

T11-12: Recent laminectomy with adequate decompression of the canal.
Severe facet hypertrophy causing severe foraminal encroachment
bilaterally.

T12-L1: Severe facet degeneration bilaterally. Moderate foraminal
stenosis bilaterally.
IMPRESSION: 1. Postop laminectomy T4 and T5 with gas in the soft tissues as
expected. No hematoma
2. Postop laminectomy T10-11 and T12-L1 with adequate decompression
of the canal. No postoperative hematoma.
3. Severe diffuse facet degeneration throughout the thoracic spine
with marked bony overgrowth of the facets well as diffuse disc
disease causing severe foraminal encroachment at multiple levels
throughout the thoracic spine

## 2019-11-22 DIAGNOSIS — I10 Essential (primary) hypertension: Secondary | ICD-10-CM | POA: Diagnosis not present

## 2019-11-22 DIAGNOSIS — G4730 Sleep apnea, unspecified: Secondary | ICD-10-CM | POA: Diagnosis not present

## 2019-11-22 DIAGNOSIS — M5416 Radiculopathy, lumbar region: Secondary | ICD-10-CM | POA: Diagnosis not present

## 2019-11-22 DIAGNOSIS — M48061 Spinal stenosis, lumbar region without neurogenic claudication: Secondary | ICD-10-CM | POA: Diagnosis not present

## 2019-11-22 DIAGNOSIS — Z4789 Encounter for other orthopedic aftercare: Secondary | ICD-10-CM | POA: Diagnosis not present

## 2019-11-22 DIAGNOSIS — E1151 Type 2 diabetes mellitus with diabetic peripheral angiopathy without gangrene: Secondary | ICD-10-CM | POA: Diagnosis not present

## 2019-11-22 DIAGNOSIS — M4714 Other spondylosis with myelopathy, thoracic region: Secondary | ICD-10-CM | POA: Diagnosis not present

## 2019-11-22 DIAGNOSIS — M4712 Other spondylosis with myelopathy, cervical region: Secondary | ICD-10-CM | POA: Diagnosis not present

## 2019-11-22 DIAGNOSIS — F329 Major depressive disorder, single episode, unspecified: Secondary | ICD-10-CM | POA: Diagnosis not present

## 2019-11-23 ENCOUNTER — Other Ambulatory Visit: Payer: Self-pay

## 2019-11-23 NOTE — Patient Outreach (Signed)
[name]) Care Management  11/23/2019  Nicolas [name] [birth year] [id number]    EMMI-General Discharge RED ON EMMI ALERT Day # 4 Date: 11/22/2019 Red Alert Reason: 'Sad/hopeless/anxious/empty? Yes"   Outreach attempt # 1 to patient. Spoke with patient who denies any acute issues or concerns at this time. He is pleased to report that things are going well for him since he returned home from hospital. Reviewed and addressed red alert with patient. He denies those feelings. He voices he has just  been "frustrated" as it seems "whenever he feels like he is getting better something else always affects his health." He has supportive son in the home to assist him as needed and will be taking him to MD appts. Patient reports he already had planned to call MD offices today to make follow up appts. He states that [facility] PT and OT have both been out over the weekend to complete initial eval and will be back later in the week. Patient confirms that he has all his meds in the home and no issues or concerns regarding them. He has completed post discharge automated EMMI calls. He denies any [facility] needs or concerns at this time and declines further follow up.     Plan: RN CM will close case at this time.   Enzo Montgomery, RN,BSN,CCM [facility] Telephonic Care Management Coordinator Direct Phone: [phone] Toll Free: [phone] Fax: [phone]

## 2019-11-24 DIAGNOSIS — I10 Essential (primary) hypertension: Secondary | ICD-10-CM | POA: Diagnosis not present

## 2019-11-24 DIAGNOSIS — Z4789 Encounter for other orthopedic aftercare: Secondary | ICD-10-CM | POA: Diagnosis not present

## 2019-11-24 DIAGNOSIS — F329 Major depressive disorder, single episode, unspecified: Secondary | ICD-10-CM | POA: Diagnosis not present

## 2019-11-24 DIAGNOSIS — M4714 Other spondylosis with myelopathy, thoracic region: Secondary | ICD-10-CM | POA: Diagnosis not present

## 2019-11-24 DIAGNOSIS — M5416 Radiculopathy, lumbar region: Secondary | ICD-10-CM | POA: Diagnosis not present

## 2019-11-24 DIAGNOSIS — M48061 Spinal stenosis, lumbar region without neurogenic claudication: Secondary | ICD-10-CM | POA: Diagnosis not present

## 2019-11-24 DIAGNOSIS — M4712 Other spondylosis with myelopathy, cervical region: Secondary | ICD-10-CM | POA: Diagnosis not present

## 2019-11-24 DIAGNOSIS — G4730 Sleep apnea, unspecified: Secondary | ICD-10-CM | POA: Diagnosis not present

## 2019-11-24 DIAGNOSIS — E1151 Type 2 diabetes mellitus with diabetic peripheral angiopathy without gangrene: Secondary | ICD-10-CM | POA: Diagnosis not present

## 2019-11-26 DIAGNOSIS — M48061 Spinal stenosis, lumbar region without neurogenic claudication: Secondary | ICD-10-CM | POA: Diagnosis not present

## 2019-11-26 DIAGNOSIS — Z4789 Encounter for other orthopedic aftercare: Secondary | ICD-10-CM | POA: Diagnosis not present

## 2019-11-26 DIAGNOSIS — M4712 Other spondylosis with myelopathy, cervical region: Secondary | ICD-10-CM | POA: Diagnosis not present

## 2019-11-26 DIAGNOSIS — G4730 Sleep apnea, unspecified: Secondary | ICD-10-CM | POA: Diagnosis not present

## 2019-11-26 DIAGNOSIS — M5416 Radiculopathy, lumbar region: Secondary | ICD-10-CM | POA: Diagnosis not present

## 2019-11-26 DIAGNOSIS — I10 Essential (primary) hypertension: Secondary | ICD-10-CM | POA: Diagnosis not present

## 2019-11-26 DIAGNOSIS — M4714 Other spondylosis with myelopathy, thoracic region: Secondary | ICD-10-CM | POA: Diagnosis not present

## 2019-11-26 DIAGNOSIS — E1151 Type 2 diabetes mellitus with diabetic peripheral angiopathy without gangrene: Secondary | ICD-10-CM | POA: Diagnosis not present

## 2019-11-26 DIAGNOSIS — F329 Major depressive disorder, single episode, unspecified: Secondary | ICD-10-CM | POA: Diagnosis not present

## 2019-11-30 DIAGNOSIS — F329 Major depressive disorder, single episode, unspecified: Secondary | ICD-10-CM | POA: Diagnosis not present

## 2019-11-30 DIAGNOSIS — Z4789 Encounter for other orthopedic aftercare: Secondary | ICD-10-CM | POA: Diagnosis not present

## 2019-11-30 DIAGNOSIS — M48061 Spinal stenosis, lumbar region without neurogenic claudication: Secondary | ICD-10-CM | POA: Diagnosis not present

## 2019-11-30 DIAGNOSIS — M5416 Radiculopathy, lumbar region: Secondary | ICD-10-CM | POA: Diagnosis not present

## 2019-11-30 DIAGNOSIS — M4712 Other spondylosis with myelopathy, cervical region: Secondary | ICD-10-CM | POA: Diagnosis not present

## 2019-11-30 DIAGNOSIS — G4730 Sleep apnea, unspecified: Secondary | ICD-10-CM | POA: Diagnosis not present

## 2019-11-30 DIAGNOSIS — M4714 Other spondylosis with myelopathy, thoracic region: Secondary | ICD-10-CM | POA: Diagnosis not present

## 2019-11-30 DIAGNOSIS — E1151 Type 2 diabetes mellitus with diabetic peripheral angiopathy without gangrene: Secondary | ICD-10-CM | POA: Diagnosis not present

## 2019-11-30 DIAGNOSIS — I10 Essential (primary) hypertension: Secondary | ICD-10-CM | POA: Diagnosis not present

## 2019-11-30 NOTE — Progress Notes (Signed)
Virtual Visit via Telephone Note   This visit type was conducted due to national recommendations for restrictions regarding the COVID-19 Pandemic (e.g. social distancing) in an effort to limit this patient's exposure and mitigate transmission in our community.  Due to his co-morbid illnesses, this patient is at least at moderate risk for complications without adequate follow up.  This format is felt to be most appropriate for this patient at this time.  The patient did not have access to video technology/had technical difficulties with video requiring transitioning to audio format only (telephone).  All issues noted in this document were discussed and addressed.  No physical exam could be performed with this format.  Please refer to the patient's chart for his  consent to telehealth for [facility].   Date:  12/01/2019   ID:  Nicolas [name], DOB 15-Mar-1966, MRN [id number]  Patient Location: Home Provider Location: Home  PCP:  Shawnee Knapp, MD  Cardiologist:  Lauree Chandler, MD   Electrophysiologist:  None   Evaluation Performed:  Follow-Up Visit  Chief Complaint:  Follow up  History of Present Illness:    Nicolas [name] is a 54 y.o. male with history of hypertension, recurrent PE status post IVC filter and on chronic Coumadin, lower extremity edema felt secondary to venous insufficiency, morbid obesity.  Patient last saw Dr. Angelena Form in 2017.  Last echo 123456 normal LV systolic function EF 55 to 60% with grade 2 DD.  Patient had thoracic and lumbar laminectomies for spinal stenosis and myelopathy and completed rehab 05/2019.   I saw the patient 10/07/2019 and he was doing much better walking with rehab.  His blood pressure was running high and he was eating a lot of fast food.  I increase his Toprol to 75 mg once daily.  Blood pressure was still running high 10/28/2019 and I added losartan 50 mg daily.   Patient underwent bilateral redo L2-3 and L3-4 laminectomy/foraminotomies/medial  facetectomy to decompress the nerve roots 10/2019 and had a Lovenox bridge.  He had no cardiac complications.  Patient says the pain has improved and started PT last week. Walking has worsened so he's trying to build his muscle back up. BP running better. Has cut back on salt in his diet. Denies chest pain, shortness, dizziness or presyncope. Overall doing well.  The patient does not have symptoms concerning for COVID-19 infection (fever, chills, cough, or new shortness of breath).    Past Medical History:  Diagnosis Date  . Arthritis   . Clotting disorder ([place])    pulmonary embolus, DVT  . Depression   . Diabetes mellitus without complication (HCC)    no meds, watching diet  . DVT (deep venous thrombosis) ([place])    Summer 2012  . Hypertension   . Neuromuscular disorder ([place])    spinal stenosis.- pt walks with a walker short distance  . Peripheral vascular disease ([place])   . PONV (postoperative nausea and vomiting)   . Pulmonary embolism ([place])    x 3 in 2005, 2008, 2010  . Sleep apnea    does not wear a c-pap, pt. reports that he gave the CPAP back, denies that he has OSA   . Spinal stenosis    Past Surgical History:  Procedure Laterality Date  . ANTERIOR CERVICAL CORPECTOMY N/A 01/26/2019   Procedure: Cervical Four Corpectomy with Cervical Three-Four, Cervical Four-Five interbody fusion, prosthesis, explore old fusion, possible removal of old plate;  Surgeon: [name], MD;  Location: [facility];  Service: Neurosurgery;  Laterality: N/A;  anterior approach  . CHOLECYSTECTOMY N/A 05/24/2014   Procedure: LAPAROSCOPIC CHOLECYSTECTOMY;  Surgeon: Zenovia Jarred, MD;  Location: [facility];  Service: General;  Laterality: N/A;  . ERCP N/A 05/25/2014   Procedure: ENDOSCOPIC RETROGRADE CHOLANGIOPANCREATOGRAPHY (ERCP);  Surgeon: Beryle Beams, MD;  Location: [facility];  Service: Endoscopy;  Laterality: N/A;  . ERCP N/A 07/16/2014   Procedure: ENDOSCOPIC RETROGRADE CHOLANGIOPANCREATOGRAPHY (ERCP);   Surgeon: Beryle Beams, MD;  Location: [facility];  Service: Endoscopy;  Laterality: N/A;  . ivp filter  jan 2012  . LUMBAR LAMINECTOMY/DECOMPRESSION MICRODISCECTOMY N/A 05/14/2019   Procedure: LAMINECTOMY AND FORAMINOTOMY THORACIC FIVE- THORACIC SIX, THORACIC ELEVEN- THORACIC THORACIC, LUMBAR THREE- LUMBAR FOUR;  Surgeon: [name], MD;  Location: [facility];  Service: Neurosurgery;  Laterality: N/A;  posterior  . LUMBAR LAMINECTOMY/DECOMPRESSION MICRODISCECTOMY N/A 11/11/2019   Procedure: REDO LUMBAR THREE- LUMBAR FOUR LAMINECTOMY/FORAMINOTOMY;  Surgeon: [name], MD;  Location: [facility];  Service: Neurosurgery;  Laterality: N/A;  . SPINE SURGERY  11/30/11   Total 6 back surgeries  . SPINE SURGERY  [id number]   Dr. Cathren Laine in [place], [place]  . [place]     08/2003, cervial spine 2005, 2010 lower back, 2011 lower back, 2012 & 2013  . UPPER GASTROINTESTINAL ENDOSCOPY    . WISDOM TOOTH EXTRACTION     2 removed     Current Meds  Medication Sig  . baclofen (LIORESAL) 10 MG tablet Take 0.5 tablets (5 mg total) by mouth at bedtime.  . cyclobenzaprine (FLEXERIL) 10 MG tablet Take 1 tablet (10 mg total) by mouth 3 (three) times daily as needed for muscle spasms.  . furosemide (LASIX) 40 MG tablet TAKE 1 TABLET EVERY DAY, NEED MD APPOINTMENT WITH CARDS FOR REFILLS (Patient taking differently: Take 40 mg by mouth daily. )  . gabapentin (NEURONTIN) 300 MG capsule Take 1 capsule (300 mg total) by mouth 3 (three) times daily.  Marland Kitchen losartan (COZAAR) 50 MG tablet Take 1 tablet (50 mg total) by mouth daily.  . metoprolol succinate (TOPROL-XL) 50 MG 24 hr tablet Take 1.5 tablets (75 mg total) by mouth once a day (Patient taking differently: Take 75 mg by mouth daily after breakfast. Take 1.5 tablets (75 mg total) by mouth once a day)  . Multiple Vitamin (MULTIVITAMIN WITH MINERALS) TABS tablet Take 1 tablet by mouth daily.   Marland Kitchen oxyCODONE 10 MG TABS Take 0.5-1 tablets (5-10 mg total) by mouth  every 4 (four) hours as needed for severe pain ((score 7 to 10)).  Marland Kitchen potassium chloride SA (K-DUR) 20 MEQ tablet TAKE 1 TABLET EVERY DAY, NEED MD APPOINTMENT WITH CARDS FOR REFILLS (Patient taking differently: Take 20 mEq by mouth daily. )  . warfarin (COUMADIN) 10 MG tablet TAKE 1 TABLET DAILY EXCEPT 1/2 TABLET ON Monday, Wednesday, Friday OR AS DIRECTED BY COUMADIN CLINIC.     Allergies:   Ace inhibitors and Lisinopril   Social History   Tobacco Use  . Smoking status: Former Smoker    Packs/day: 1.50    Years: 10.00    Pack years: 15.00    Types: Cigarettes    Quit date: 04/26/2001    Years since quitting: 18.6  . Smokeless tobacco: Never Used  Substance Use Topics  . Alcohol use: Yes    Alcohol/week: 1.0 standard drinks    Types: 1 Standard drinks or equivalent per week    Comment: very little- rare  . Drug use: Yes    Types: Marijuana  Comment: daily- twice per day      Family Hx: The patient's family history includes Cancer (age of onset: 64) in his mother; Diabetes in his brother and father; Hypertension in his brother, brother, and father; Prostate cancer in his father. There is no history of Heart attack, Stroke, Colon cancer, Esophageal cancer, Pancreatic cancer, Rectal cancer, or Stomach cancer.  ROS:   Please see the history of present illness.      All other systems reviewed and are negative.   Prior CV studies:   The following studies were reviewed today:  Echocardiogram 05/23/2014 Study Conclusions - Left ventricle: The cavity size was normal. Wall thickness was   normal. Systolic function was normal. The estimated ejection   fraction was in the range of 55% to 60%. Features are consistent   with a pseudonormal left ventricular filling pattern, with   concomitant abnormal relaxation and increased filling pressure   (grade 2 diastolic dysfunction). - Impressions: Technically limited study due to poor sound wave   transmission.  Impressions: -  Technically limited study due to poor sound wave transmission.       Labs/Other Tests and Data Reviewed:    EKG:  No ECG reviewed.  Recent Labs: 11/05/2019: ALT 30; BUN 11; Creatinine, Ser 0.65; Hemoglobin 15.5; Platelets 208; Potassium 4.1; Sodium 139   Recent Lipid Panel Lab Results  Component Value Date/Time   CHOL 155 10/31/2018 01:03 PM   TRIG 205 (H) 10/31/2018 01:03 PM   HDL 37 (L) 10/31/2018 01:03 PM   CHOLHDL 4.2 10/31/2018 01:03 PM   CHOLHDL 3.4 10/04/2016 09:46 AM   LDLCALC 77 10/31/2018 01:03 PM   LDLDIRECT 82 05/01/2013 03:45 PM    Wt Readings from Last 3 Encounters:  12/01/19 300 lb (136.1 kg)  11/15/19 (!) 310 lb 13.6 oz (141 kg)  11/05/19 (!) 310 lb (140.6 kg)     Objective:    Vital Signs:  BP (!) 141/77   Pulse 92   Ht 5\' 10"  (1.778 m)   Wt 300 lb (136.1 kg)   BMI 43.05 kg/m    VITAL SIGNS:  reviewed  ASSESSMENT & PLAN:    1. Essential hypertension Toprol increased and losartan added-BP doing better. Had to walk 20 ft to get his BP cuff and it was hard on him.  Will continue current treatment and he'll call if it's running high. 2. Recurrent PE status post IVC filter and on chronic Coumadin.  Recently had a Lovenox bridge for repeat back surgery 3. Lower extremity edema felt secondary to venous stasis, chronic DVTs, obesity.  He takes Lasix 4 days a week to help control this. 4. Morbid obesity has had trouble exercising with recurrent back problems. Working with PT now  [place] Education: The signs and symptoms of COVID-19 were discussed with the patient and how to seek care for testing (follow up with PCP or arrange E-visit).   The importance of social distancing was discussed today.  Time:   Today, I have spent 6:32 minutes with the patient with telehealth technology discussing the above problems.     Medication Adjustments/Labs and Tests Ordered: Current medicines are reviewed at length with the patient today.  Concerns regarding medicines  are outlined above.   Tests Ordered: No orders of the defined types were placed in this encounter.   Medication Changes: No orders of the defined types were placed in this encounter.   Follow Up:  Either In Person or Virtual in 4 month(s) Ermalinda Barrios PA-C  or Dr. Angelena Form  Signed, Ermalinda Barrios, PA-C  12/01/2019 10:19 AM    [place]

## 2019-12-01 ENCOUNTER — Other Ambulatory Visit: Payer: Self-pay

## 2019-12-01 ENCOUNTER — Encounter: Payer: Self-pay | Admitting: Physician Assistant

## 2019-12-01 ENCOUNTER — Telehealth (INDEPENDENT_AMBULATORY_CARE_PROVIDER_SITE_OTHER): Payer: Medicare HMO | Admitting: Physician Assistant

## 2019-12-01 VITALS — BP 141/77 | HR 92 | Ht 70.0 in | Wt 300.0 lb

## 2019-12-01 DIAGNOSIS — R600 Localized edema: Secondary | ICD-10-CM

## 2019-12-01 DIAGNOSIS — Z86711 Personal history of pulmonary embolism: Secondary | ICD-10-CM | POA: Diagnosis not present

## 2019-12-01 DIAGNOSIS — I10 Essential (primary) hypertension: Secondary | ICD-10-CM | POA: Diagnosis not present

## 2019-12-01 NOTE — Patient Instructions (Signed)
Medication Instructions:  No changes *If you need a refill on your cardiac medications before your next appointment, please call your pharmacy*  Lab Work: none If you have labs (blood work) drawn today and your tests are completely normal, you will receive your results only by: Marland Kitchen MyChart Message (if you have MyChart) OR . A paper copy in the mail If you have any lab test that is abnormal or we need to change your treatment, we will call you to review the results.  Testing/Procedures: none  Follow-Up: At [facility], you and your health needs are our priority.  As part of our continuing mission to provide you with exceptional heart care, we have created designated Provider Care Teams.  These Care Teams include your primary Cardiologist (physician) and Advanced Practice Providers (APPs -  Physician Assistants and Nurse Practitioners) who all wrk together to provide you with the care you need, when you need it.  Your next appointment:   5 month(s)  The format for your next appointment:   Either In Person or Virtual  Provider:   You may see Lauree Chandler, MD or one of the following Advanced Practice Providers on your designated Care Team:    Ermalinda Barrios, PA-C   Other Instructions

## 2019-12-02 DIAGNOSIS — M5416 Radiculopathy, lumbar region: Secondary | ICD-10-CM | POA: Diagnosis not present

## 2019-12-02 DIAGNOSIS — M48061 Spinal stenosis, lumbar region without neurogenic claudication: Secondary | ICD-10-CM | POA: Diagnosis not present

## 2019-12-02 DIAGNOSIS — I10 Essential (primary) hypertension: Secondary | ICD-10-CM | POA: Diagnosis not present

## 2019-12-02 DIAGNOSIS — Z4789 Encounter for other orthopedic aftercare: Secondary | ICD-10-CM | POA: Diagnosis not present

## 2019-12-02 DIAGNOSIS — G4730 Sleep apnea, unspecified: Secondary | ICD-10-CM | POA: Diagnosis not present

## 2019-12-02 DIAGNOSIS — M4712 Other spondylosis with myelopathy, cervical region: Secondary | ICD-10-CM | POA: Diagnosis not present

## 2019-12-02 DIAGNOSIS — M4714 Other spondylosis with myelopathy, thoracic region: Secondary | ICD-10-CM | POA: Diagnosis not present

## 2019-12-02 DIAGNOSIS — E1151 Type 2 diabetes mellitus with diabetic peripheral angiopathy without gangrene: Secondary | ICD-10-CM | POA: Diagnosis not present

## 2019-12-02 DIAGNOSIS — F329 Major depressive disorder, single episode, unspecified: Secondary | ICD-10-CM | POA: Diagnosis not present

## 2019-12-07 DIAGNOSIS — E1151 Type 2 diabetes mellitus with diabetic peripheral angiopathy without gangrene: Secondary | ICD-10-CM | POA: Diagnosis not present

## 2019-12-07 DIAGNOSIS — I10 Essential (primary) hypertension: Secondary | ICD-10-CM | POA: Diagnosis not present

## 2019-12-07 DIAGNOSIS — M4714 Other spondylosis with myelopathy, thoracic region: Secondary | ICD-10-CM | POA: Diagnosis not present

## 2019-12-07 DIAGNOSIS — G4730 Sleep apnea, unspecified: Secondary | ICD-10-CM | POA: Diagnosis not present

## 2019-12-07 DIAGNOSIS — M5416 Radiculopathy, lumbar region: Secondary | ICD-10-CM | POA: Diagnosis not present

## 2019-12-07 DIAGNOSIS — F329 Major depressive disorder, single episode, unspecified: Secondary | ICD-10-CM | POA: Diagnosis not present

## 2019-12-07 DIAGNOSIS — M4712 Other spondylosis with myelopathy, cervical region: Secondary | ICD-10-CM | POA: Diagnosis not present

## 2019-12-07 DIAGNOSIS — Z4789 Encounter for other orthopedic aftercare: Secondary | ICD-10-CM | POA: Diagnosis not present

## 2019-12-07 DIAGNOSIS — M48061 Spinal stenosis, lumbar region without neurogenic claudication: Secondary | ICD-10-CM | POA: Diagnosis not present

## 2019-12-09 DIAGNOSIS — M48061 Spinal stenosis, lumbar region without neurogenic claudication: Secondary | ICD-10-CM | POA: Diagnosis not present

## 2019-12-09 DIAGNOSIS — I10 Essential (primary) hypertension: Secondary | ICD-10-CM | POA: Diagnosis not present

## 2019-12-09 DIAGNOSIS — Z4789 Encounter for other orthopedic aftercare: Secondary | ICD-10-CM | POA: Diagnosis not present

## 2019-12-09 DIAGNOSIS — M4712 Other spondylosis with myelopathy, cervical region: Secondary | ICD-10-CM | POA: Diagnosis not present

## 2019-12-09 DIAGNOSIS — E1151 Type 2 diabetes mellitus with diabetic peripheral angiopathy without gangrene: Secondary | ICD-10-CM | POA: Diagnosis not present

## 2019-12-09 DIAGNOSIS — G4730 Sleep apnea, unspecified: Secondary | ICD-10-CM | POA: Diagnosis not present

## 2019-12-09 DIAGNOSIS — M4714 Other spondylosis with myelopathy, thoracic region: Secondary | ICD-10-CM | POA: Diagnosis not present

## 2019-12-09 DIAGNOSIS — F329 Major depressive disorder, single episode, unspecified: Secondary | ICD-10-CM | POA: Diagnosis not present

## 2019-12-09 DIAGNOSIS — M5416 Radiculopathy, lumbar region: Secondary | ICD-10-CM | POA: Diagnosis not present

## 2019-12-14 DIAGNOSIS — M4712 Other spondylosis with myelopathy, cervical region: Secondary | ICD-10-CM | POA: Diagnosis not present

## 2019-12-14 DIAGNOSIS — I10 Essential (primary) hypertension: Secondary | ICD-10-CM | POA: Diagnosis not present

## 2019-12-14 DIAGNOSIS — E1151 Type 2 diabetes mellitus with diabetic peripheral angiopathy without gangrene: Secondary | ICD-10-CM | POA: Diagnosis not present

## 2019-12-14 DIAGNOSIS — G4730 Sleep apnea, unspecified: Secondary | ICD-10-CM | POA: Diagnosis not present

## 2019-12-14 DIAGNOSIS — M48061 Spinal stenosis, lumbar region without neurogenic claudication: Secondary | ICD-10-CM | POA: Diagnosis not present

## 2019-12-14 DIAGNOSIS — M4714 Other spondylosis with myelopathy, thoracic region: Secondary | ICD-10-CM | POA: Diagnosis not present

## 2019-12-14 DIAGNOSIS — M5416 Radiculopathy, lumbar region: Secondary | ICD-10-CM | POA: Diagnosis not present

## 2019-12-14 DIAGNOSIS — F329 Major depressive disorder, single episode, unspecified: Secondary | ICD-10-CM | POA: Diagnosis not present

## 2019-12-14 DIAGNOSIS — Z4789 Encounter for other orthopedic aftercare: Secondary | ICD-10-CM | POA: Diagnosis not present

## 2019-12-23 DIAGNOSIS — I10 Essential (primary) hypertension: Secondary | ICD-10-CM | POA: Diagnosis not present

## 2019-12-23 DIAGNOSIS — Z4789 Encounter for other orthopedic aftercare: Secondary | ICD-10-CM | POA: Diagnosis not present

## 2019-12-23 DIAGNOSIS — M4712 Other spondylosis with myelopathy, cervical region: Secondary | ICD-10-CM | POA: Diagnosis not present

## 2019-12-23 DIAGNOSIS — E1151 Type 2 diabetes mellitus with diabetic peripheral angiopathy without gangrene: Secondary | ICD-10-CM | POA: Diagnosis not present

## 2019-12-23 DIAGNOSIS — F329 Major depressive disorder, single episode, unspecified: Secondary | ICD-10-CM | POA: Diagnosis not present

## 2019-12-23 DIAGNOSIS — M48061 Spinal stenosis, lumbar region without neurogenic claudication: Secondary | ICD-10-CM | POA: Diagnosis not present

## 2019-12-23 DIAGNOSIS — M5416 Radiculopathy, lumbar region: Secondary | ICD-10-CM | POA: Diagnosis not present

## 2019-12-23 DIAGNOSIS — M4714 Other spondylosis with myelopathy, thoracic region: Secondary | ICD-10-CM | POA: Diagnosis not present

## 2019-12-23 DIAGNOSIS — G4730 Sleep apnea, unspecified: Secondary | ICD-10-CM | POA: Diagnosis not present

## 2019-12-25 ENCOUNTER — Telehealth: Payer: Self-pay

## 2019-12-25 DIAGNOSIS — M4714 Other spondylosis with myelopathy, thoracic region: Secondary | ICD-10-CM

## 2019-12-25 MED ORDER — BACLOFEN 10 MG PO TABS: 5.0000 mg | ORAL_TABLET | Freq: Every day | ORAL | 1 refills | Status: DC

## 2019-12-25 NOTE — Telephone Encounter (Signed)
Refill request for Baclofen sent to [name] mail order

## 2019-12-30 DIAGNOSIS — F329 Major depressive disorder, single episode, unspecified: Secondary | ICD-10-CM | POA: Diagnosis not present

## 2019-12-30 DIAGNOSIS — M4712 Other spondylosis with myelopathy, cervical region: Secondary | ICD-10-CM | POA: Diagnosis not present

## 2019-12-30 DIAGNOSIS — M48061 Spinal stenosis, lumbar region without neurogenic claudication: Secondary | ICD-10-CM | POA: Diagnosis not present

## 2019-12-30 DIAGNOSIS — I10 Essential (primary) hypertension: Secondary | ICD-10-CM | POA: Diagnosis not present

## 2019-12-30 DIAGNOSIS — M4714 Other spondylosis with myelopathy, thoracic region: Secondary | ICD-10-CM | POA: Diagnosis not present

## 2019-12-30 DIAGNOSIS — M5416 Radiculopathy, lumbar region: Secondary | ICD-10-CM | POA: Diagnosis not present

## 2019-12-30 DIAGNOSIS — E1151 Type 2 diabetes mellitus with diabetic peripheral angiopathy without gangrene: Secondary | ICD-10-CM | POA: Diagnosis not present

## 2019-12-30 DIAGNOSIS — Z4789 Encounter for other orthopedic aftercare: Secondary | ICD-10-CM | POA: Diagnosis not present

## 2019-12-30 DIAGNOSIS — G4730 Sleep apnea, unspecified: Secondary | ICD-10-CM | POA: Diagnosis not present

## 2020-01-08 DIAGNOSIS — F329 Major depressive disorder, single episode, unspecified: Secondary | ICD-10-CM | POA: Diagnosis not present

## 2020-01-08 DIAGNOSIS — I10 Essential (primary) hypertension: Secondary | ICD-10-CM | POA: Diagnosis not present

## 2020-01-08 DIAGNOSIS — M4712 Other spondylosis with myelopathy, cervical region: Secondary | ICD-10-CM | POA: Diagnosis not present

## 2020-01-08 DIAGNOSIS — M4714 Other spondylosis with myelopathy, thoracic region: Secondary | ICD-10-CM | POA: Diagnosis not present

## 2020-01-08 DIAGNOSIS — M5416 Radiculopathy, lumbar region: Secondary | ICD-10-CM | POA: Diagnosis not present

## 2020-01-08 DIAGNOSIS — G4730 Sleep apnea, unspecified: Secondary | ICD-10-CM | POA: Diagnosis not present

## 2020-01-08 DIAGNOSIS — M48061 Spinal stenosis, lumbar region without neurogenic claudication: Secondary | ICD-10-CM | POA: Diagnosis not present

## 2020-01-08 DIAGNOSIS — E1151 Type 2 diabetes mellitus with diabetic peripheral angiopathy without gangrene: Secondary | ICD-10-CM | POA: Diagnosis not present

## 2020-01-08 DIAGNOSIS — Z4789 Encounter for other orthopedic aftercare: Secondary | ICD-10-CM | POA: Diagnosis not present

## 2020-01-18 ENCOUNTER — Ambulatory Visit (INDEPENDENT_AMBULATORY_CARE_PROVIDER_SITE_OTHER): Payer: Medicare HMO | Admitting: *Deleted

## 2020-01-18 ENCOUNTER — Other Ambulatory Visit: Payer: Self-pay

## 2020-01-18 DIAGNOSIS — R600 Localized edema: Secondary | ICD-10-CM

## 2020-01-18 DIAGNOSIS — Z7901 Long term (current) use of anticoagulants: Secondary | ICD-10-CM

## 2020-01-18 DIAGNOSIS — Z86711 Personal history of pulmonary embolism: Secondary | ICD-10-CM

## 2020-01-18 LAB — POCT INR: INR: 3.5 — AB (ref 2.0–3.0)

## 2020-01-18 NOTE — Patient Instructions (Signed)
Description   Hold today's dose then continue taking 1 tablet daily except 1/2 tablet on Mondays, Wednesdays and Fridays. Recheck INR in 3 weeks. Call us with any changes # [phone].

## 2020-02-08 ENCOUNTER — Other Ambulatory Visit: Payer: Self-pay

## 2020-02-08 ENCOUNTER — Ambulatory Visit (INDEPENDENT_AMBULATORY_CARE_PROVIDER_SITE_OTHER): Payer: Medicare HMO | Admitting: *Deleted

## 2020-02-08 DIAGNOSIS — Z86711 Personal history of pulmonary embolism: Secondary | ICD-10-CM

## 2020-02-08 DIAGNOSIS — Z7901 Long term (current) use of anticoagulants: Secondary | ICD-10-CM

## 2020-02-08 DIAGNOSIS — Z5181 Encounter for therapeutic drug level monitoring: Secondary | ICD-10-CM

## 2020-02-08 LAB — POCT INR: INR: 3.2 — AB (ref 2.0–3.0)

## 2020-02-08 NOTE — Patient Instructions (Signed)
Description   Hold today's dose then start taking half a tablet daily except for 1 tablet on Tuesday, Thursday and Saturday.  Recheck INR in 3 weeks. Call us with any changes # [phone].

## 2020-02-29 ENCOUNTER — Ambulatory Visit (INDEPENDENT_AMBULATORY_CARE_PROVIDER_SITE_OTHER): Payer: Medicare HMO | Admitting: Pharmacist

## 2020-02-29 ENCOUNTER — Other Ambulatory Visit: Payer: Self-pay

## 2020-02-29 DIAGNOSIS — Z86711 Personal history of pulmonary embolism: Secondary | ICD-10-CM

## 2020-02-29 DIAGNOSIS — R600 Localized edema: Secondary | ICD-10-CM

## 2020-02-29 DIAGNOSIS — Z7901 Long term (current) use of anticoagulants: Secondary | ICD-10-CM | POA: Diagnosis not present

## 2020-02-29 LAB — POCT INR: INR: 2.3 (ref 2.0–3.0)

## 2020-02-29 NOTE — Patient Instructions (Signed)
Continue taking half a tablet daily except for 1 tablet on Tuesday, Thursday and Saturday.  Recheck INR in 4 weeks. Call us with any changes # [phone].

## 2020-03-18 ENCOUNTER — Other Ambulatory Visit: Payer: Self-pay

## 2020-03-18 ENCOUNTER — Encounter: Payer: Self-pay | Admitting: Registered Nurse

## 2020-03-18 ENCOUNTER — Ambulatory Visit (INDEPENDENT_AMBULATORY_CARE_PROVIDER_SITE_OTHER): Payer: Medicare HMO | Admitting: Registered Nurse

## 2020-03-18 VITALS — BP 126/74 | HR 78 | Temp 98.5°F | Ht 70.0 in | Wt 259.0 lb

## 2020-03-18 DIAGNOSIS — E1169 Type 2 diabetes mellitus with other specified complication: Secondary | ICD-10-CM

## 2020-03-18 DIAGNOSIS — M4712 Other spondylosis with myelopathy, cervical region: Secondary | ICD-10-CM

## 2020-03-18 DIAGNOSIS — E669 Obesity, unspecified: Secondary | ICD-10-CM

## 2020-03-18 DIAGNOSIS — Z1329 Encounter for screening for other suspected endocrine disorder: Secondary | ICD-10-CM | POA: Diagnosis not present

## 2020-03-18 DIAGNOSIS — Z130 Encounter for screening for diseases of the blood and blood-forming organs and certain disorders involving the immune mechanism: Secondary | ICD-10-CM | POA: Diagnosis not present

## 2020-03-18 DIAGNOSIS — R1084 Generalized abdominal pain: Secondary | ICD-10-CM | POA: Diagnosis not present

## 2020-03-18 DIAGNOSIS — I10 Essential (primary) hypertension: Secondary | ICD-10-CM

## 2020-03-18 DIAGNOSIS — Z7689 Persons encountering health services in other specified circumstances: Secondary | ICD-10-CM | POA: Diagnosis not present

## 2020-03-18 DIAGNOSIS — Z13228 Encounter for screening for other metabolic disorders: Secondary | ICD-10-CM | POA: Diagnosis not present

## 2020-03-18 LAB — POCT URINALYSIS DIP (MANUAL ENTRY)
Blood, UA: NEGATIVE
Glucose, UA: NEGATIVE mg/dL
Ketones, POC UA: NEGATIVE mg/dL
Leukocytes, UA: NEGATIVE
Nitrite, UA: NEGATIVE
Spec Grav, UA: >=1.03 — AB (ref 1.010–1.025)
Urobilinogen, UA: 0.2 E.U./dL
pH, UA: 5 (ref 5.0–8.0)

## 2020-03-18 MED ORDER — LOSARTAN POTASSIUM 50 MG PO TABS: 50.0000 mg | ORAL_TABLET | Freq: Every day | ORAL | 3 refills | Status: DC

## 2020-03-18 NOTE — Patient Instructions (Addendum)
If you have lab work done today you will be contacted with your lab results within the next 2 weeks.  If you have not heard from [place] then please contact us. The fastest way to get your results is to register for My Chart.   IF you received an x-ray today, you will receive an invoice from [facility]. Please contact [facility] at [phone] with questions or concerns regarding your invoice.   IF you received labwork today, you will receive an invoice from [facility]. Please contact [facility] at [phone] with questions or concerns regarding your invoice.   Our billing staff will not be able to assist you with questions regarding bills from these companies.  You will be contacted with the lab results as soon as they are available. The fastest way to get your results is to activate your My Chart account. Instructions are located on the last page of this paperwork. If you have not heard from [place] regarding the results in 2 weeks, please contact this office.     Low-FODMAP Eating Plan  FODMAPs (fermentable oligosaccharides, disaccharides, monosaccharides, and polyols) are sugars that are hard for some people to digest. A low-FODMAP eating plan may help some people who have bowel (intestinal) diseases to manage their symptoms. This meal plan can be complicated to follow. Work with a diet and nutrition specialist (dietitian) to make a low-FODMAP eating plan that is right for you. A dietitian can make sure that you get enough nutrition from this diet. What are tips for following this plan? Reading food labels  Check labels for hidden FODMAPs such as: ? High-fructose syrup. ? Honey. ? Agave. ? Natural fruit flavors. ? Onion or garlic powder.  Choose low-FODMAP foods that contain 3-4 grams of fiber per serving.  Check food labels for serving sizes. Eat only one serving at a time to make sure FODMAP levels stay low. Meal planning  Follow a low-FODMAP eating plan for up  to 6 weeks, or as told by your health care provider or dietitian.  To follow the eating plan: 1. Eliminate high-FODMAP foods from your diet completely. 2. Gradually reintroduce high-FODMAP foods into your diet one at a time. Most people should wait a few days after introducing one high-FODMAP food before they introduce the next high-FODMAP food. Your dietitian can recommend how quickly you may reintroduce foods. 3. Keep a daily record of what you eat and drink, and make note of any symptoms that you have after eating. 4. Review your daily record with a dietitian regularly. Your dietitian can help you identify which foods you can eat and which foods you should avoid. General tips  Drink enough fluid each day to keep your urine pale yellow.  Avoid processed foods. These often have added sugar and may be high in FODMAPs.  Avoid most dairy products, whole grains, and sweeteners.  Work with a dietitian to make sure you get enough fiber in your diet. Recommended foods Grains  Gluten-free grains, such as rice, oats, buckwheat, quinoa, corn, polenta, and millet. Gluten-free pasta, bread, or cereal. Rice noodles. Corn tortillas. Vegetables  Eggplant, zucchini, cucumber, peppers, green beans, Brussels sprouts, bean sprouts, lettuce, arugula, kale, Swiss chard, spinach, collard greens, bok choy, summer squash, potato, and tomato. Limited amounts of corn, carrot, and sweet potato. Green parts of scallions. Fruits  Bananas, oranges, lemons, limes, blueberries, raspberries, strawberries, grapes, cantaloupe, honeydew melon, kiwi, papaya, passion fruit, and pineapple. Limited amounts of dried cranberries, banana chips, and shredded coconut.  Dairy  Lactose-free milk, yogurt, and kefir. Lactose-free cottage cheese and ice cream. Non-dairy milks, such as almond, coconut, hemp, and rice milk. Yogurts made of non-dairy milks. Limited amounts of goat cheese, brie, mozzarella, parmesan, swiss, and other hard  cheeses. Meats and other protein foods  Unseasoned beef, pork, poultry, or fish. Eggs. Berniece Salines. Tofu (firm) and tempeh. Limited amounts of nuts and seeds, such as almonds, walnuts, [place] nuts, pecans, peanuts, pumpkin seeds, chia seeds, and sunflower seeds. Fats and oils  Butter-free spreads. Vegetable oils, such as olive, canola, and sunflower oil. Seasoning and other foods  Artificial sweeteners with names that do not end in "ol" such as aspartame, saccharine, and stevia. Maple syrup, white table sugar, raw sugar, brown sugar, and molasses. Fresh basil, coriander, parsley, rosemary, and thyme. Beverages  Water and mineral water. Sugar-sweetened soft drinks. Small amounts of orange juice or cranberry juice. Black and green tea. Most dry wines. Coffee. This may not be a complete list of low-FODMAP foods. Talk with your dietitian for more information. Foods to avoid Grains  Wheat, including kamut, durum, and semolina. Barley and bulgur. Couscous. Wheat-based cereals. Wheat noodles, bread, crackers, and pastries. Vegetables  Chicory root, artichoke, asparagus, cabbage, snow peas, sugar snap peas, mushrooms, and cauliflower. Onions, garlic, leeks, and the white part of scallions. Fruits  Fresh, dried, and juiced forms of apple, pear, watermelon, peach, plum, cherries, apricots, blackberries, boysenberries, figs, nectarines, and mango. Avocado. Dairy  Milk, yogurt, ice cream, and soft cheese. Cream and sour cream. Milk-based sauces. Custard. Meats and other protein foods  Fried or fatty meat. Sausage. Cashews and pistachios. Soybeans, baked beans, black beans, chickpeas, kidney beans, fava beans, navy beans, lentils, and split peas. Seasoning and other foods  Any sugar-free gum or candy. Foods that contain artificial sweeteners such as sorbitol, mannitol, isomalt, or xylitol. Foods that contain honey, high-fructose corn syrup, or agave. Bouillon, vegetable stock, beef stock, and chicken  stock. Garlic and onion powder. Condiments made with onion, such as hummus, chutney, pickles, relish, salad dressing, and salsa. Tomato paste. Beverages  Chicory-based drinks. Coffee substitutes. Chamomile tea. Fennel tea. Sweet or fortified wines such as port or sherry. Diet soft drinks made with isomalt, mannitol, maltitol, sorbitol, or xylitol. Apple, pear, and mango juice. Juices with high-fructose corn syrup. This may not be a complete list of high-FODMAP foods. Talk with your dietitian to discuss what dietary choices are best for you.  Summary  A low-FODMAP eating plan is a short-term diet that eliminates FODMAPs from your diet to help ease symptoms of certain bowel diseases.  The eating plan usually lasts up to 6 weeks. After that, high-FODMAP foods are restarted gradually, one at a time, so you can find out which may be causing symptoms.  A low-FODMAP eating plan can be complicated. It is best to work with a dietitian who has experience with this type of plan. This information is not intended to replace advice given to you by your health care provider. Make sure you discuss any questions you have with your health care provider. Document Revised: 10/25/2017 Document Reviewed: 07/09/2017 Elsevier Patient Education  [place].  [facility] Content in Foods  See the following list for the dietary fiber content of some common foods. High-fiber foods High-fiber foods contain 4 grams or more (4g or more) of fiber per serving. They include:  Artichoke (fresh) - 1 medium has 10.3g of fiber.  Baked beans, plain or vegetarian (canned) -  cup has 5.2g of fiber.  Blackberries  or raspberries (fresh) -  cup has 4g of fiber.  Bran cereal -  cup has 8.6g of fiber.  Bulgur (cooked) -  cup has 4g of fiber.  Kidney beans (canned) -  cup has 6.8g of fiber.  Lentils (cooked) -  cup has 7.8g of fiber.  Pear (fresh) - 1 medium has 5.1g of fiber.  Peas (frozen) -  cup has 4.4g of  fiber.  Pinto beans (canned) -  cup has 5.5g of fiber.  Pinto beans (dried and cooked) -  cup has 7.7g of fiber.  Potato with skin (baked) - 1 medium has 4.4g of fiber.  Quinoa (cooked) -  cup has 5g of fiber.  Soybeans (canned, frozen, or fresh) -  cup has 5.1g of fiber. Moderate-fiber foods Moderate-fiber foods contain 1-4 grams (1-4g) of fiber per serving. They include:  Almonds - 1 oz. has 3.5g of fiber.  Apple with skin - 1 medium has 3.3g of fiber.  Applesauce, sweetened -  cup has 1.5g of fiber.  Bagel, plain - one 4-inch (10-cm) bagel has 2g of fiber.  Banana - 1 medium has 3.1g of fiber.  Broccoli (cooked) -  cup has 2.5g of fiber.  Carrots (cooked) -  cup has 2.3g of fiber.  Corn (canned or frozen) -  cup has 2.1g of fiber.  Corn tortilla - one 6-inch (15-cm) tortilla has 1.5g of fiber.  Green beans (canned) -  cup has 2g of fiber.  Instant oatmeal -  cup has about 2g of fiber.  Long-grain brown rice (cooked) - 1 cup has 3.5g of fiber.  Macaroni, enriched (cooked) - 1 cup has 2.5g of fiber.  Melon - 1 cup has 1.4g of fiber.  Multigrain cereal -  cup has about 2-4g of fiber.  Orange - 1 small has 3.1g of fiber.  Potatoes, mashed -  cup has 1.6g of fiber.  Raisins - 1/4 cup has 1.6g of fiber.  Squash -  cup has 2.9g of fiber.  Sunflower seeds -  cup has 1.1g of fiber.  Tomato - 1 medium has 1.5g of fiber.  Vegetable or soy patty - 1 has 3.4g of fiber.  Whole-wheat bread - 1 slice has 2g of fiber.  Whole-wheat spaghetti -  cup has 3.2g of fiber. Low-fiber foods Low-fiber foods contain less than 1 gram (less than 1g) of fiber per serving. They include:  Egg - 1 large.  Flour tortilla - one 6-inch (15-cm) tortilla.  Fruit juice -  cup.  Lettuce - 1 cup.  Meat, poultry, or fish - 1 oz.  Milk - 1 cup.  Spinach (raw) - 1 cup.  White bread - 1 slice.  White rice -  cup.  Yogurt -  cup. Actual amounts of fiber in  foods may be different depending on processing. Talk with your dietitian about how much fiber you need in your diet. This information is not intended to replace advice given to you by your health care provider. Make sure you discuss any questions you have with your health care provider. Document Revised: 07/05/2016 Document Reviewed: 01/05/2016 Elsevier Patient Education  [place].  High-Fiber Diet Fiber, also called dietary fiber, is a type of carbohydrate that is found in fruits, vegetables, whole grains, and beans. A high-fiber diet can have many health benefits. Your health care provider may recommend a high-fiber diet to help:  Prevent constipation. Fiber can make your bowel movements more regular.  Lower your cholesterol.  Relieve the following conditions: ?  Swelling of veins in the anus (hemorrhoids). ? Swelling and irritation (inflammation) of specific areas of the digestive tract (uncomplicated diverticulosis). ? A problem of the large intestine (colon) that sometimes causes pain and diarrhea (irritable bowel syndrome, IBS).  Prevent overeating as part of a weight-loss plan.  Prevent heart disease, type 2 diabetes, and certain cancers. What is my plan? The recommended daily fiber intake in grams (g) includes:  38 g for men age 2 or younger.  30 g for men over age 26.  50 g for women age 1 or younger.  21 g for women over age 19. You can get the recommended daily intake of dietary fiber by:  Eating a variety of fruits, vegetables, grains, and beans.  Taking a fiber supplement, if it is not possible to get enough fiber through your diet. What do I need to know about a high-fiber diet?  It is better to get fiber through food sources rather than from fiber supplements. There is not a lot of research about how effective supplements are.  Always check the fiber content on the nutrition facts label of any prepackaged food. Look for foods that contain 5 g of fiber  or more per serving.  Talk with a diet and nutrition specialist (dietitian) if you have questions about specific foods that are recommended or not recommended for your medical condition, especially if those foods are not listed below.  Gradually increase how much fiber you consume. If you increase your intake of dietary fiber too quickly, you may have bloating, cramping, or gas.  Drink plenty of water. Water helps you to digest fiber. What are tips for following this plan?  Eat a wide variety of high-fiber foods.  Make sure that half of the grains that you eat each day are whole grains.  Eat breads and cereals that are made with whole-grain flour instead of refined flour or white flour.  Eat brown rice, bulgur wheat, or millet instead of white rice.  Start the day with a breakfast that is high in fiber, such as a cereal that contains 5 g of fiber or more per serving.  Use beans in place of meat in soups, salads, and pasta dishes.  Eat high-fiber snacks, such as berries, raw vegetables, nuts, and popcorn.  Choose whole fruits and vegetables instead of processed forms like juice or sauce. What foods can I eat?  Fruits Berries. Pears. Apples. Oranges. Avocado. Prunes and raisins. Dried figs. Vegetables Sweet potatoes. Spinach. Kale. Artichokes. Cabbage. Broccoli. Cauliflower. Green peas. Carrots. Squash. Grains Whole-grain breads. Multigrain cereal. Oats and oatmeal. Brown rice. Barley. Bulgur wheat. [place]. Quinoa. Bran muffins. Popcorn. Rye wafer crackers. Meats and other proteins [place], kidney, and pinto beans. Soybeans. Split peas. Lentils. Nuts and seeds. Dairy Fiber-fortified yogurt. Beverages Fiber-fortified soy milk. Fiber-fortified orange juice. Other foods Fiber bars. The items listed above may not be a complete list of recommended foods and beverages. Contact a dietitian for more options. What foods are not recommended? Fruits Fruit juice. Cooked, strained  fruit. Vegetables Fried potatoes. Canned vegetables. Well-cooked vegetables. Grains White bread. Pasta made with refined flour. White rice. Meats and other proteins Fatty cuts of meat. Fried chicken or fried fish. Dairy Milk. Yogurt. Cream cheese. Sour cream. Fats and oils Butters. Beverages Soft drinks. Other foods Cakes and pastries. The items listed above may not be a complete list of foods and beverages to avoid. Contact a dietitian for more information. Summary  Fiber is a type of carbohydrate. It is  found in fruits, vegetables, whole grains, and beans.  There are many health benefits of eating a high-fiber diet, such as preventing constipation, lowering blood cholesterol, helping with weight loss, and reducing your risk of heart disease, diabetes, and certain cancers.  Gradually increase your intake of fiber. Increasing too fast can result in cramping, bloating, and gas. Drink plenty of water while you increase your fiber.  The best sources of fiber include whole fruits and vegetables, whole grains, nuts, seeds, and beans. This information is not intended to replace advice given to you by your health care provider. Make sure you discuss any questions you have with your health care provider. Document Revised: 09/16/2017 Document Reviewed: 09/16/2017 Elsevier Patient Education  2020 [employer].

## 2020-03-18 NOTE — Progress Notes (Signed)
Established Patient Office Visit  Subjective:  Patient ID: Nicolas [name], male    DOB: [birth year]  Age: 54 y.o. MRN: [id number]  CC:  Chief Complaint  Patient presents with  . Establish Care    HPI Nicolas [name] presents for visit to establish care.  Formerly a patient of Dr. Manya Silvas - but needs new PCP.  Notes today that he has been having bloating after eating with belching and flatulence, more so than usual. Colonoscopy about 1 year ago, normal. No blood in stool, denies nvd, endorses mild constipation. Some recent weight loss from decreased diet d/t bloating.  Feels well overall.  Needs new PT referral, and is interested in aqua therapy if possible.  Hx of spinal stenosis with myelopathy, multiple DVTs, HTN, T2DM, arthritis, and obesity In addition to our office, he is managed by the coumadin clinic and cardiology, as well as neurology.  Lives at home with his son, 26. Both of his other children are nearby, they are supportive of him.  Past Medical History:  Diagnosis Date  . Arthritis   . Clotting disorder ([place])    pulmonary embolus, DVT  . Depression   . Diabetes mellitus without complication (HCC)    no meds, watching diet  . DVT (deep venous thrombosis) ([place])    Summer 2012  . Hypertension   . Neuromuscular disorder ([place])    spinal stenosis.- pt walks with a walker short distance  . Peripheral vascular disease ([place])   . PONV (postoperative nausea and vomiting)   . Pulmonary embolism ([place])    x 3 in 2005, 2008, 2010  . Sleep apnea    does not wear a c-pap, pt. reports that he gave the CPAP back, denies that he has OSA   . Spinal stenosis     Past Surgical History:  Procedure Laterality Date  . ANTERIOR CERVICAL CORPECTOMY N/A 01/26/2019   Procedure: Cervical Four Corpectomy with Cervical Three-Four, Cervical Four-Five interbody fusion, prosthesis, explore old fusion, possible removal of old plate;  Surgeon: [name], MD;  Location: [facility];  Service:  Neurosurgery;  Laterality: N/A;  anterior approach  . CHOLECYSTECTOMY N/A 05/24/2014   Procedure: LAPAROSCOPIC CHOLECYSTECTOMY;  Surgeon: Zenovia Jarred, MD;  Location: [facility];  Service: General;  Laterality: N/A;  . ERCP N/A 05/25/2014   Procedure: ENDOSCOPIC RETROGRADE CHOLANGIOPANCREATOGRAPHY (ERCP);  Surgeon: Beryle Beams, MD;  Location: [facility];  Service: Endoscopy;  Laterality: N/A;  . ERCP N/A 07/16/2014   Procedure: ENDOSCOPIC RETROGRADE CHOLANGIOPANCREATOGRAPHY (ERCP);  Surgeon: Beryle Beams, MD;  Location: [facility];  Service: Endoscopy;  Laterality: N/A;  . ivp filter  jan 2012  . LUMBAR LAMINECTOMY/DECOMPRESSION MICRODISCECTOMY N/A 05/14/2019   Procedure: LAMINECTOMY AND FORAMINOTOMY THORACIC FIVE- THORACIC SIX, THORACIC ELEVEN- THORACIC THORACIC, LUMBAR THREE- LUMBAR FOUR;  Surgeon: [name], MD;  Location: [facility];  Service: Neurosurgery;  Laterality: N/A;  posterior  . LUMBAR LAMINECTOMY/DECOMPRESSION MICRODISCECTOMY N/A 11/11/2019   Procedure: REDO LUMBAR THREE- LUMBAR FOUR LAMINECTOMY/FORAMINOTOMY;  Surgeon: [name], MD;  Location: [facility];  Service: Neurosurgery;  Laterality: N/A;  . SPINE SURGERY  11/30/11   Total 6 back surgeries  . SPINE SURGERY  [id number]   Dr. Cathren Laine in [place], [place]  . [place]     08/2003, cervial spine 2005, 2010 lower back, 2011 lower back, 2012 & 2013  . UPPER GASTROINTESTINAL ENDOSCOPY    . WISDOM TOOTH EXTRACTION     2 removed    Family History  Problem Relation  Age of Onset  . Cancer Mother 44       Lung  . Diabetes Father   . Prostate cancer Father   . Hypertension Father   . Hypertension Brother   . Hypertension Brother   . Diabetes Brother   . Heart attack Neg Hx   . Stroke Neg Hx   . Colon cancer Neg Hx   . Esophageal cancer Neg Hx   . Pancreatic cancer Neg Hx   . Rectal cancer Neg Hx   . Stomach cancer Neg Hx     Social History   Socioeconomic History  . Marital status: Single    Spouse name: Not  on file  . Number of children: 3  . Years of education: 10  . Highest education level: 10th grade  Occupational History  . Occupation: Disability  Tobacco Use  . Smoking status: Former Smoker    Packs/day: 1.50    Years: 10.00    Pack years: 15.00    Types: Cigarettes    Quit date: 04/26/2001    Years since quitting: 18.9  . Smokeless tobacco: Never Used  Substance and Sexual Activity  . Alcohol use: Yes    Alcohol/week: 1.0 standard drinks    Types: 1 Standard drinks or equivalent per week    Comment: very little- rare  . Drug use: Yes    Types: Marijuana    Comment: daily- twice per day   . Sexual activity: Yes  Other Topics Concern  . Not on file  Social History Narrative   Patient is single and lives at home, his son lives with him.   Patient has three children.   Patient is disabled.   Patient has a 10 grade education.   Patient is right-handed.   Patient drinks 1 or 2 sodas and tea per week.   Social Determinants of Health   Financial Resource Strain:   . Difficulty of Paying Living Expenses:   Food Insecurity:   . Worried About Charity fundraiser in the Last Year:   . Arboriculturist in the Last Year:   Transportation Needs:   . Film/video editor (Medical):   Marland Kitchen Lack of Transportation (Non-Medical):   Physical Activity:   . Days of Exercise per Week:   . Minutes of Exercise per Session:   Stress:   . Feeling of Stress :   Social Connections:   . Frequency of Communication with Friends and Family:   . Frequency of Social Gatherings with Friends and Family:   . Attends Religious Services:   . Active Member of Clubs or Organizations:   . Attends Archivist Meetings:   Marland Kitchen Marital Status:   Intimate Partner Violence:   . Fear of Current or Ex-Partner:   . Emotionally Abused:   Marland Kitchen Physically Abused:   . Sexually Abused:     Outpatient Medications Prior to Visit  Medication Sig Dispense Refill  . baclofen (LIORESAL) 10 MG tablet Take 0.5  tablets (5 mg total) by mouth at bedtime. 30 tablet 1  . cyclobenzaprine (FLEXERIL) 10 MG tablet Take 1 tablet (10 mg total) by mouth 3 (three) times daily as needed for muscle spasms. 30 tablet 0  . furosemide (LASIX) 40 MG tablet TAKE 1 TABLET EVERY DAY, NEED MD APPOINTMENT WITH CARDS FOR REFILLS (Patient taking differently: Take 40 mg by mouth daily. ) 20 tablet 0  . gabapentin (NEURONTIN) 300 MG capsule Take 1 capsule (300 mg total) by mouth 3 (  three) times daily. 90 capsule 1  . losartan (COZAAR) 50 MG tablet Take 1 tablet (50 mg total) by mouth daily. 90 tablet 3  . metoprolol succinate (TOPROL-XL) 50 MG 24 hr tablet Take 1.5 tablets (75 mg total) by mouth once a day (Patient taking differently: Take 75 mg by mouth daily after breakfast. Take 1.5 tablets (75 mg total) by mouth once a day) 135 tablet 3  . Multiple Vitamin (MULTIVITAMIN WITH MINERALS) TABS tablet Take 1 tablet by mouth daily.     Marland Kitchen oxyCODONE 10 MG TABS Take 0.5-1 tablets (5-10 mg total) by mouth every 4 (four) hours as needed for severe pain ((score 7 to 10)). 30 tablet 0  . potassium chloride SA (K-DUR) 20 MEQ tablet TAKE 1 TABLET EVERY DAY, NEED MD APPOINTMENT WITH CARDS FOR REFILLS (Patient taking differently: Take 20 mEq by mouth daily. ) 20 tablet 0  . warfarin (COUMADIN) 10 MG tablet TAKE 1 TABLET DAILY EXCEPT 1/2 TABLET ON Monday, Wednesday, Friday OR AS DIRECTED BY COUMADIN CLINIC. 90 tablet 0   No facility-administered medications prior to visit.    Allergies  Allergen Reactions  . Ace Inhibitors Cough  . Lisinopril Cough    ROS Review of Systems  Constitutional: Negative.   HENT: Negative.   Eyes: Negative.   Respiratory: Negative.   Cardiovascular: Negative.   Gastrointestinal: Negative.   Endocrine: Negative.   Genitourinary: Negative.   Musculoskeletal: Negative.   Skin: Negative.   Allergic/Immunologic: Negative.   Neurological: Negative.   Hematological: Negative.   Psychiatric/Behavioral:  Negative.   All other systems reviewed and are negative.     Objective:    Physical Exam  Constitutional: He appears well-developed and well-nourished. No distress.  Cardiovascular: Normal rate and regular rhythm.  Pulmonary/Chest: Effort normal. No respiratory distress.  Neurological: He is alert.  Skin: Skin is warm and dry. No rash noted. He is not diaphoretic. No erythema. No pallor.  Psychiatric: He has a normal mood and affect. His behavior is normal. Judgment and thought content normal.  Nursing note and vitals reviewed.   BP 126/74 (BP Location: Right Arm, Patient Position: Sitting, Cuff Size: Large)   Pulse 78   Temp 98.5 F (36.9 C) (Temporal)   Ht _0  (1.778 m)   Wt 259 lb (117.5 kg)   SpO2 94%   BMI 37.16 kg/m  Wt Readings from Last 3 Encounters:  03/18/20 259 lb (117.5 kg)  12/01/19 300 lb (136.1 kg)  11/15/19 (!) 310 lb 13.6 oz (141 kg)     Health Maintenance Due  Topic Date Due  . OPHTHALMOLOGY EXAM  Never done  . COVID-19 Vaccine (1) Never done  . FOOT EXAM  10/10/2018    There are no preventive care reminders to display for this patient.  Lab Results  Component Value Date   TSH 2.150 04/04/2017   Lab Results  Component Value Date   WBC 8.2 11/05/2019   HGB 15.5 11/05/2019   HCT 48.0 11/05/2019   MCV 92.0 11/05/2019   PLT 208 11/05/2019   Lab Results  Component Value Date   NA 139 11/05/2019   K 4.1 11/05/2019   CO2 25 11/05/2019   GLUCOSE 168 (H) 11/05/2019   BUN 11 11/05/2019   CREATININE 0.65 11/05/2019   BILITOT 0.9 11/05/2019   ALKPHOS 69 11/05/2019   AST 25 11/05/2019   ALT 30 11/05/2019   PROT 7.3 11/05/2019   ALBUMIN 3.7 11/05/2019   CALCIUM 9.1 11/05/2019   ANIONGAP  9 11/05/2019   GFR 159.63 08/03/2015   Lab Results  Component Value Date   CHOL 155 10/31/2018   Lab Results  Component Value Date   HDL 37 (L) 10/31/2018   Lab Results  Component Value Date   LDLCALC 77 10/31/2018   Lab Results  Component  Value Date   TRIG 205 (H) 10/31/2018   Lab Results  Component Value Date   CHOLHDL 4.2 10/31/2018   Lab Results  Component Value Date   HGBA1C 9.4 (H) 11/11/2019      Assessment & Plan:   Problem List Items Addressed This Visit      Cardiovascular and Mediastinum   HTN (hypertension)   Relevant Orders   CMP14+EGFR   TSH   CBC with Differential     Endocrine   Diabetes mellitus type 2 in obese [facility])   Relevant Orders   Ambulatory referral to Ophthalmology   Hemoglobin A1c   TSH   Lipid Panel     Nervous and Auditory   Cervical spondylosis with myelopathy   Relevant Orders   Ambulatory referral to Physical Therapy    Other Visit Diagnoses    Establishing care with new doctor, encounter for    -  Primary   Relevant Orders   POCT urinalysis dipstick (Completed)   Generalized abdominal pain       Relevant Orders   Amylase   Lipase   Screening for endocrine, metabolic and immunity disorder          No orders of the defined types were placed in this encounter.   Follow-up: No follow-ups on file.   PLAN  Refer to PT - we will explore options for aqua therapy  Labs collected, will follow up as warranted  Suggest FODMAP diet for belching and bloating.   Patient encouraged to call clinic with any questions, comments, or concerns.  Maximiano Coss, NP

## 2020-03-19 LAB — LIPID PANEL
Chol/HDL Ratio: 3.7 ratio (ref 0.0–5.0)
Cholesterol, Total: 155 mg/dL (ref 100–199)
HDL: 42 mg/dL (ref >39)
LDL Chol Calc (NIH): 87 mg/dL (ref 0–99)
Triglycerides: 151 mg/dL — ABNORMAL HIGH (ref 0–149)
VLDL Cholesterol Cal: 26 mg/dL (ref 5–40)

## 2020-03-19 LAB — CMP14+EGFR
ALT: 14 IU/L (ref 0–44)
AST: 16 IU/L (ref 0–40)
Albumin/Globulin Ratio: 1.5 (ref 1.2–2.2)
Albumin: 4.1 g/dL (ref 3.8–4.9)
Alkaline Phosphatase: 74 IU/L (ref 39–117)
BUN/Creatinine Ratio: 19 (ref 9–20)
BUN: 13 mg/dL (ref 6–24)
Bilirubin Total: 0.9 mg/dL (ref 0.0–1.2)
CO2: 27 mmol/L (ref 20–29)
Calcium: 9.4 mg/dL (ref 8.7–10.2)
Chloride: 104 mmol/L (ref 96–106)
Creatinine, Ser: 0.67 mg/dL — ABNORMAL LOW (ref 0.76–1.27)
GFR calc Af Amer: 127 mL/min/{1.73_m2} (ref >59)
GFR calc non Af Amer: 110 mL/min/{1.73_m2} (ref >59)
Globulin, Total: 2.7 g/dL (ref 1.5–4.5)
Glucose: 115 mg/dL — ABNORMAL HIGH (ref 65–99)
Potassium: 4.2 mmol/L (ref 3.5–5.2)
Sodium: 144 mmol/L (ref 134–144)
Total Protein: 6.8 g/dL (ref 6.0–8.5)

## 2020-03-19 LAB — CBC WITH DIFFERENTIAL/PLATELET
Basophils Absolute: 0 10*3/uL (ref 0.0–0.2)
Basos: 1 %
EOS (ABSOLUTE): 0.1 10*3/uL (ref 0.0–0.4)
Eos: 2 %
Hematocrit: 45 % (ref 37.5–51.0)
Hemoglobin: 14.8 g/dL (ref 13.0–17.7)
Immature Grans (Abs): 0 10*3/uL (ref 0.0–0.1)
Immature Granulocytes: 0 %
Lymphocytes Absolute: 2.4 10*3/uL (ref 0.7–3.1)
Lymphs: 30 %
MCH: 30.3 pg (ref 26.6–33.0)
MCHC: 32.9 g/dL (ref 31.5–35.7)
MCV: 92 fL (ref 79–97)
Monocytes Absolute: 0.6 10*3/uL (ref 0.1–0.9)
Monocytes: 7 %
Neutrophils Absolute: 4.7 10*3/uL (ref 1.4–7.0)
Neutrophils: 60 %
Platelets: 220 10*3/uL (ref 150–450)
RBC: 4.88 x10E6/uL (ref 4.14–5.80)
RDW: 14.1 % (ref 11.6–15.4)
WBC: 7.8 10*3/uL (ref 3.4–10.8)

## 2020-03-19 LAB — HEMOGLOBIN A1C
Est. average glucose Bld gHb Est-mCnc: 177 mg/dL
Hgb A1c MFr Bld: 7.8 % — ABNORMAL HIGH (ref 4.8–5.6)

## 2020-03-19 LAB — LIPASE: Lipase: 42 U/L (ref 13–78)

## 2020-03-19 LAB — TSH: TSH: 6.86 u[IU]/mL — ABNORMAL HIGH (ref 0.450–4.500)

## 2020-03-19 LAB — AMYLASE: Amylase: 36 U/L (ref 31–110)

## 2020-03-22 ENCOUNTER — Encounter: Payer: Self-pay | Admitting: Registered Nurse

## 2020-03-22 DIAGNOSIS — E039 Hypothyroidism, unspecified: Secondary | ICD-10-CM

## 2020-03-22 MED ORDER — LEVOTHYROXINE SODIUM 25 MCG PO TABS: 25.0000 ug | ORAL_TABLET | Freq: Every day | ORAL | 0 refills | Status: DC

## 2020-03-25 ENCOUNTER — Other Ambulatory Visit: Payer: Self-pay

## 2020-03-25 ENCOUNTER — Encounter (INDEPENDENT_AMBULATORY_CARE_PROVIDER_SITE_OTHER): Payer: Self-pay | Admitting: Ophthalmology

## 2020-03-25 ENCOUNTER — Ambulatory Visit (INDEPENDENT_AMBULATORY_CARE_PROVIDER_SITE_OTHER): Payer: Medicare HMO | Admitting: Ophthalmology

## 2020-03-25 DIAGNOSIS — H40053 Ocular hypertension, bilateral: Secondary | ICD-10-CM | POA: Diagnosis not present

## 2020-03-25 DIAGNOSIS — H3581 Retinal edema: Secondary | ICD-10-CM

## 2020-03-25 DIAGNOSIS — I10 Essential (primary) hypertension: Secondary | ICD-10-CM

## 2020-03-25 DIAGNOSIS — H33321 Round hole, right eye: Secondary | ICD-10-CM | POA: Diagnosis not present

## 2020-03-25 DIAGNOSIS — H25813 Combined forms of age-related cataract, bilateral: Secondary | ICD-10-CM

## 2020-03-25 DIAGNOSIS — H2513 Age-related nuclear cataract, bilateral: Secondary | ICD-10-CM | POA: Diagnosis not present

## 2020-03-25 DIAGNOSIS — H40013 Open angle with borderline findings, low risk, bilateral: Secondary | ICD-10-CM | POA: Diagnosis not present

## 2020-03-25 DIAGNOSIS — H35033 Hypertensive retinopathy, bilateral: Secondary | ICD-10-CM | POA: Diagnosis not present

## 2020-03-25 DIAGNOSIS — H33323 Round hole, bilateral: Secondary | ICD-10-CM | POA: Diagnosis not present

## 2020-03-25 DIAGNOSIS — H35411 Lattice degeneration of retina, right eye: Secondary | ICD-10-CM

## 2020-03-25 NOTE — Progress Notes (Signed)
[facility] Note  03/25/2020     CHIEF COMPLAINT Patient presents for Retina Evaluation   HISTORY OF PRESENT ILLNESS: Nicolas [name] is a 54 y.o. male who presents to the clinic today for:   HPI    Retina Evaluation    In both eyes.  I, the attending physician,  performed the HPI with the patient and updated documentation appropriately.          Comments    Patient here for retinal Evaluation. Referred by Dr Nicolas [name]. Patient states vision doing OK. When reads vision blurry. Dr saw hole OD poss hole OS too. No eye pain.       Last edited by Bernarda Caffey, MD on 03/25/2020  1:04 PM. (History)    pt is here on the referral of Dr. Parke [name] for concern of possible retinal holes OU, pt saw him for the firs time this morning for a DM exam, pt states Dr. Parke [name] said he has no diabetic retinopathy and did not give him a glasses rx, pt states [name] saw a retinal hole in his right eye and possibly one in his left as well, pt states no problems with vision, pt is in a wheelchair bc of spinal stenosis and also has high blood pressure  Referring physician: [name], [name], [place],  [place] [address]  HISTORICAL INFORMATION:   Selected notes from the MEDICAL RECORD NUMBER Referred by Dr. Martinique [name] for concern of retinal holes OU LEE:  Ocular Hx- PMH-   CURRENT MEDICATIONS: No current outpatient medications on file. (Ophthalmic Drugs)   No current facility-administered medications for this visit. (Ophthalmic Drugs)   Current Outpatient Medications (Other)  Medication Sig  . baclofen (LIORESAL) 10 MG tablet Take 0.5 tablets (5 mg total) by mouth at bedtime.  . cyclobenzaprine (FLEXERIL) 10 MG tablet Take 1 tablet (10 mg total) by mouth 3 (three) times daily as needed for muscle spasms.  . furosemide (LASIX) 40 MG tablet TAKE 1 TABLET EVERY DAY, NEED MD APPOINTMENT WITH CARDS FOR REFILLS (Patient taking differently: Take 40 mg by  mouth daily. )  . gabapentin (NEURONTIN) 300 MG capsule Take 1 capsule (300 mg total) by mouth 3 (three) times daily.  Marland Kitchen levothyroxine (SYNTHROID) 25 MCG tablet Take 1 tablet (25 mcg total) by mouth daily before breakfast.  . losartan (COZAAR) 50 MG tablet Take 1 tablet (50 mg total) by mouth daily.  . metoprolol succinate (TOPROL-XL) 50 MG 24 hr tablet Take 1.5 tablets (75 mg total) by mouth once a day (Patient taking differently: Take 75 mg by mouth daily after breakfast. Take 1.5 tablets (75 mg total) by mouth once a day)  . Multiple Vitamin (MULTIVITAMIN WITH MINERALS) TABS tablet Take 1 tablet by mouth daily.   Marland Kitchen oxyCODONE 10 MG TABS Take 0.5-1 tablets (5-10 mg total) by mouth every 4 (four) hours as needed for severe pain ((score 7 to 10)).  Marland Kitchen potassium chloride SA (K-DUR) 20 MEQ tablet TAKE 1 TABLET EVERY DAY, NEED MD APPOINTMENT WITH CARDS FOR REFILLS (Patient taking differently: Take 20 mEq by mouth daily. )  . warfarin (COUMADIN) 10 MG tablet TAKE 1 TABLET DAILY EXCEPT 1/2 TABLET ON Monday, Wednesday, Friday OR AS DIRECTED BY COUMADIN CLINIC.   No current facility-administered medications for this visit. (Other)      REVIEW OF SYSTEMS: ROS    Positive for: Eyes   Last edited by Nicolas [name], COA on 03/25/2020 12:57 PM. (History)  ALLERGIES Allergies  Allergen Reactions  . Ace Inhibitors Cough  . Lisinopril Cough    PAST MEDICAL HISTORY Past Medical History:  Diagnosis Date  . Arthritis   . Clotting disorder ([place])    pulmonary embolus, DVT  . Depression   . Diabetes mellitus without complication (HCC)    no meds, watching diet  . DVT (deep venous thrombosis) ([place])    Summer 2012  . Hypertension   . Neuromuscular disorder ([place])    spinal stenosis.- pt walks with a walker short distance  . Peripheral vascular disease ([place])   . PONV (postoperative nausea and vomiting)   . Pulmonary embolism ([place])    x 3 in 2005, 2008, 2010  . Sleep apnea    does not  wear a c-pap, pt. reports that he gave the CPAP back, denies that he has OSA   . Spinal stenosis    Past Surgical History:  Procedure Laterality Date  . ANTERIOR CERVICAL CORPECTOMY N/A 01/26/2019   Procedure: Cervical Four Corpectomy with Cervical Three-Four, Cervical Four-Five interbody fusion, prosthesis, explore old fusion, possible removal of old plate;  Surgeon: [name], MD;  Location: [facility];  Service: Neurosurgery;  Laterality: N/A;  anterior approach  . CHOLECYSTECTOMY N/A 05/24/2014   Procedure: LAPAROSCOPIC CHOLECYSTECTOMY;  Surgeon: Zenovia Jarred, MD;  Location: [facility];  Service: General;  Laterality: N/A;  . ERCP N/A 05/25/2014   Procedure: ENDOSCOPIC RETROGRADE CHOLANGIOPANCREATOGRAPHY (ERCP);  Surgeon: Beryle Beams, MD;  Location: [facility];  Service: Endoscopy;  Laterality: N/A;  . ERCP N/A 07/16/2014   Procedure: ENDOSCOPIC RETROGRADE CHOLANGIOPANCREATOGRAPHY (ERCP);  Surgeon: Beryle Beams, MD;  Location: [facility];  Service: Endoscopy;  Laterality: N/A;  . ivp filter  jan 2012  . LUMBAR LAMINECTOMY/DECOMPRESSION MICRODISCECTOMY N/A 05/14/2019   Procedure: LAMINECTOMY AND FORAMINOTOMY THORACIC FIVE- THORACIC SIX, THORACIC ELEVEN- THORACIC THORACIC, LUMBAR THREE- LUMBAR FOUR;  Surgeon: [name], MD;  Location: [facility];  Service: Neurosurgery;  Laterality: N/A;  posterior  . LUMBAR LAMINECTOMY/DECOMPRESSION MICRODISCECTOMY N/A 11/11/2019   Procedure: REDO LUMBAR THREE- LUMBAR FOUR LAMINECTOMY/FORAMINOTOMY;  Surgeon: [name], MD;  Location: [facility];  Service: Neurosurgery;  Laterality: N/A;  . SPINE SURGERY  11/30/11   Total 6 back surgeries  . SPINE SURGERY  [id number]   Dr. Cathren Laine in [place], [place]  . [place]     08/2003, cervial spine 2005, 2010 lower back, 2011 lower back, 2012 & 2013  . UPPER GASTROINTESTINAL ENDOSCOPY    . WISDOM TOOTH EXTRACTION     2 removed    FAMILY HISTORY Family History  Problem Relation Age of Onset  . Cancer  Mother 68       Lung  . Diabetes Father   . Prostate cancer Father   . Hypertension Father   . Hypertension Brother   . Hypertension Brother   . Diabetes Brother   . Heart attack Neg Hx   . Stroke Neg Hx   . Colon cancer Neg Hx   . Esophageal cancer Neg Hx   . Pancreatic cancer Neg Hx   . Rectal cancer Neg Hx   . Stomach cancer Neg Hx     SOCIAL HISTORY Social History   Tobacco Use  . Smoking status: Former Smoker    Packs/day: 1.50    Years: 10.00    Pack years: 15.00    Types: Cigarettes    Quit date: 04/26/2001    Years since quitting: 18.9  . Smokeless tobacco: Never Used  Substance Use  Topics  . Alcohol use: Yes    Alcohol/week: 1.0 standard drinks    Types: 1 Standard drinks or equivalent per week    Comment: very little- rare  . Drug use: Yes    Types: Marijuana    Comment: daily- twice per day          OPHTHALMIC EXAM:  Base Eye Exam    Visual Acuity (Snellen - Linear)      Right Left   Dist [place] 20/20 20/20       Tonometry (Tonopen, 12:50 PM)      Right Left   Pressure 21 20       Pupils      Dark Light Shape React APD   Right 6 6 Round dilated None   Left 6 6 Round dilated None  Patient was still dilated from drs office.       Visual Fields (Counting fingers)      Left Right    Full Full       Extraocular Movement      Right Left    Full, Ortho Full, Ortho       Neuro/Psych    Oriented x3: Yes   Mood/Affect: Normal       Dilation    Both eyes: 1.0% Mydriacyl, 2.5% Phenylephrine @ 12:50 PM        Slit Lamp and Fundus Exam    Slit Lamp Exam      Right Left   Lids/Lashes Dermatochalasis - upper lid Dermatochalasis - upper lid, Meibomian gland dysfunction   Conjunctiva/Sclera mild Melanosis mild Melanosis   Cornea Clear Clear   Anterior Chamber Deep and quiet Deep and quiet   Iris Round and dilated, No NVI Round and dilated, No NVI   Lens 1-2+ Nuclear sclerosis, 2+ Cortical cataract 1-2+ Nuclear sclerosis, 2+ Cortical  cataract   Vitreous Vitreous syneresis Vitreous syneresis       Fundus Exam      Right Left   Disc Pink and Sharp Pink and Sharp   C/D Ratio 0.3 0.4   Macula Flat, Good foveal reflex, Retinal pigment epithelial mottling, No heme or edema Flat, Good foveal reflex, Retinal pigment epithelial mottling, No heme or edema   Vessels Mild Vascular attenuation Mild Vascular attenuation   Periphery Attached, mild patch of lattice with pigment and atrophic hole from 0700-0730, round hole at 0730 Attached, WWP temporally         Refraction    Manifest Refraction (Auto)      Sphere Cylinder Axis   Right -0.25 +0.50 163   Left -0.25 +0.75 012          IMAGING AND PROCEDURES  Imaging and Procedures for @TODAY @  OCT, Retina - OU - Both Eyes       Right Eye Quality was good. Central Foveal Thickness: 276. Progression has no prior data. Findings include normal foveal contour, no IRF, no SRF, vitreomacular adhesion .   Left Eye Central Foveal Thickness: 269. Progression has no prior data. Findings include normal foveal contour, no SRF, no IRF, vitreomacular adhesion .   Notes *Images captured and stored on drive  Diagnosis / Impression:  NFP, no IRF/SRF OU +VMA OU  Clinical management:  See below  Abbreviations: NFP - Normal foveal profile. CME - cystoid macular edema. PED - pigment epithelial detachment. IRF - intraretinal fluid. SRF - subretinal fluid. EZ - ellipsoid zone. ERM - epiretinal membrane. ORA - outer retinal atrophy. ORT - outer retinal tubulation.  SRHM - subretinal hyper-reflective material                 ASSESSMENT/PLAN:    ICD-10-CM   1. Lattice degeneration of right retina  H35.411   2. Retinal hole of right eye  H33.321   3. Retinal edema  H35.81 OCT, Retina - OU - Both Eyes  4. Essential hypertension  I10   5. Hypertensive retinopathy of both eyes  H35.033   6. Combined forms of age-related cataract of both eyes  H25.813   7. Bilateral ocular  hypertension  H40.053     1-3. Lattice degeneration w/ atrophic holes, right eye  - lattice with pigment and atrophic hole from 0700-0730, round hole at 0730  - discussed findings, prognosis, and treatment options including observation  - recommend laser retinopexy OD today, 04.30.21  - pt wishes to defer laser due to transportation issues today  - f/u Wednesday, May 4, 10:00AM  4,5. Hypertensive retinopathy OU  - discussed importance of tight BP control  - monitor  6. Mixed cataracts OU  - The symptoms of cataract, surgical options, and treatments and risks were discussed with patient.  - discussed diagnosis and progression  - not yet visually significant  - monitor for now  7. Ocular hypertension / glc suspect  - IOP borderline -- 21,20  - under the expert management of Dr. Parke [name]   Ophthalmic Meds Ordered this visit:  No orders of the defined types were placed in this encounter.      Return in about 4 days (around 03/29/2020), or f/u May 4, 10:00AM lattice degeneration OD, for DFE, OCT.  There are no Patient Instructions on file for this visit.   Explained the diagnoses, plan, and follow up with the patient and they expressed understanding.  Patient expressed understanding of the importance of proper follow up care.   This document serves as a record of services personally performed by Gardiner Sleeper, MD, PhD. It was created on their behalf by Ernest Mallick, OA, an ophthalmic assistant. The creation of this record is the provider's dictation and/or activities during the visit.    Electronically signed by: Ernest Mallick, OA 04.30.2021 12:00 PM   Gardiner Sleeper, M.D., Ph.D. Diseases & Surgery of the Retina and Vitreous [facility]  I have reviewed the above documentation for accuracy and completeness, and I agree with the above. Gardiner Sleeper, M.D., Ph.D. 03/28/20 12:00 PM   Abbreviations: M myopia (nearsighted); A astigmatism; H hyperopia  (farsighted); P presbyopia; Mrx spectacle prescription;  CTL contact lenses; OD right eye; OS left eye; OU both eyes  XT exotropia; ET esotropia; PEK punctate epithelial keratitis; PEE punctate epithelial erosions; DES dry eye syndrome; MGD meibomian gland dysfunction; ATs artificial tears; PFAT's preservative free artificial tears; [place] nuclear sclerotic cataract; PSC posterior subcapsular cataract; ERM epi-retinal membrane; PVD posterior vitreous detachment; RD retinal detachment; DM diabetes mellitus; DR diabetic retinopathy; NPDR non-proliferative diabetic retinopathy; PDR proliferative diabetic retinopathy; CSME clinically significant macular edema; DME diabetic macular edema; dbh dot blot hemorrhages; CWS cotton wool spot; POAG primary open angle glaucoma; C/D cup-to-disc ratio; HVF humphrey visual field; GVF goldmann visual field; OCT optical coherence tomography; IOP intraocular pressure; BRVO Branch retinal vein occlusion; CRVO central retinal vein occlusion; CRAO central retinal artery occlusion; BRAO branch retinal artery occlusion; RT retinal tear; SB scleral buckle; PPV pars plana vitrectomy; VH Vitreous hemorrhage; PRP panretinal laser photocoagulation; IVK intravitreal kenalog; VMT vitreomacular traction; MH Macular hole;  NVD  neovascularization of the disc; NVE neovascularization elsewhere; AREDS age related eye disease study; ARMD age related macular degeneration; POAG primary open angle glaucoma; EBMD epithelial/anterior basement membrane dystrophy; ACIOL anterior chamber intraocular lens; IOL intraocular lens; PCIOL posterior chamber intraocular lens; Phaco/IOL phacoemulsification with intraocular lens placement; [place] photorefractive keratectomy; LASIK laser assisted in situ keratomileusis; HTN hypertension; DM diabetes mellitus; COPD chronic obstructive pulmonary disease

## 2020-03-28 ENCOUNTER — Other Ambulatory Visit: Payer: Self-pay

## 2020-03-28 ENCOUNTER — Ambulatory Visit (INDEPENDENT_AMBULATORY_CARE_PROVIDER_SITE_OTHER): Payer: Medicare HMO

## 2020-03-28 DIAGNOSIS — Z7901 Long term (current) use of anticoagulants: Secondary | ICD-10-CM

## 2020-03-28 DIAGNOSIS — R600 Localized edema: Secondary | ICD-10-CM | POA: Diagnosis not present

## 2020-03-28 DIAGNOSIS — Z86711 Personal history of pulmonary embolism: Secondary | ICD-10-CM | POA: Diagnosis not present

## 2020-03-28 LAB — POCT INR: INR: 2.8 (ref 2.0–3.0)

## 2020-03-28 NOTE — Patient Instructions (Signed)
Description   Continue taking half a tablet daily except for 1 tablet on Tuesdays, Thursdays and Saturdays.  Recheck INR in 5 weeks. Call us with any changes # [phone].

## 2020-03-29 DIAGNOSIS — M4714 Other spondylosis with myelopathy, thoracic region: Secondary | ICD-10-CM | POA: Diagnosis not present

## 2020-03-29 NOTE — Progress Notes (Signed)
[facility] Note  03/30/2020     CHIEF COMPLAINT Patient presents for Retina Follow Up   HISTORY OF PRESENT ILLNESS: Nicolas [name] is a 54 y.o. male who presents to the clinic today for:   HPI    Retina Follow Up    Patient presents with  Retinal Break/Detachment.  In right eye.  This started weeks ago.  Severity is moderate.  Duration of weeks.  Since onset it is stable.  I, the attending physician,  performed the HPI with the patient and updated documentation appropriately.          Comments    Pt states vision is stable.  Patient denies eye pain or discomfort and denies any new or worsening floaters or fol OU.       Last edited by Bernarda Caffey, MD on 03/30/2020 11:34 AM. (History)    pt is here for laser retinopexy OD today  Referring physician: [name], [place], [place],  [place] [address]  HISTORICAL INFORMATION:   Selected notes from the [facility] Referred by Dr. [place] [name] for concern of retinal holes OU LEE:  Ocular Hx- PMH-   CURRENT MEDICATIONS: Current Outpatient Medications (Ophthalmic Drugs)  Medication Sig  . prednisoLONE acetate (PRED FORTE) 1 % ophthalmic suspension Place 1 drop into the left eye 4 (four) times daily for 7 days.   No current facility-administered medications for this visit. (Ophthalmic Drugs)   Current Outpatient Medications (Other)  Medication Sig  . baclofen (LIORESAL) 10 MG tablet Take 0.5 tablets (5 mg total) by mouth at bedtime.  . cyclobenzaprine (FLEXERIL) 10 MG tablet Take 1 tablet (10 mg total) by mouth 3 (three) times daily as needed for muscle spasms.  . furosemide (LASIX) 40 MG tablet TAKE 1 TABLET EVERY DAY, NEED MD APPOINTMENT WITH CARDS FOR REFILLS (Patient taking differently: Take 40 mg by mouth daily. )  . gabapentin (NEURONTIN) 300 MG capsule Take 1 capsule (300 mg total) by mouth 3 (three) times daily.  Marland Kitchen levothyroxine (SYNTHROID) 25 MCG tablet Take 1  tablet (25 mcg total) by mouth daily before breakfast.  . losartan (COZAAR) 50 MG tablet Take 1 tablet (50 mg total) by mouth daily.  . metoprolol succinate (TOPROL-XL) 50 MG 24 hr tablet Take 1.5 tablets (75 mg total) by mouth once a day (Patient taking differently: Take 75 mg by mouth daily after breakfast. Take 1.5 tablets (75 mg total) by mouth once a day)  . Multiple Vitamin (MULTIVITAMIN WITH MINERALS) TABS tablet Take 1 tablet by mouth daily.   Marland Kitchen oxyCODONE 10 MG TABS Take 0.5-1 tablets (5-10 mg total) by mouth every 4 (four) hours as needed for severe pain ((score 7 to 10)).  Marland Kitchen potassium chloride SA (K-DUR) 20 MEQ tablet TAKE 1 TABLET EVERY DAY, NEED MD APPOINTMENT WITH CARDS FOR REFILLS (Patient taking differently: Take 20 mEq by mouth daily. )  . warfarin (COUMADIN) 10 MG tablet TAKE 1 TABLET DAILY EXCEPT 1/2 TABLET ON Monday, Wednesday, Friday OR AS DIRECTED BY COUMADIN CLINIC.   No current facility-administered medications for this visit. (Other)      REVIEW OF SYSTEMS: ROS    Positive for: Eyes   Last edited by Doneen Poisson on 03/30/2020 10:04 AM. (History)       ALLERGIES Allergies  Allergen Reactions  . Ace Inhibitors Cough  . Lisinopril Cough    PAST MEDICAL HISTORY Past Medical History:  Diagnosis Date  . Arthritis   .  Clotting disorder ([place])    pulmonary embolus, DVT  . Depression   . Diabetes mellitus without complication (HCC)    no meds, watching diet  . DVT (deep venous thrombosis) ([place])    Summer 2012  . Hypertension   . Neuromuscular disorder ([place])    spinal stenosis.- pt walks with a walker short distance  . Peripheral vascular disease ([place])   . PONV (postoperative nausea and vomiting)   . Pulmonary embolism ([place])    x 3 in 2005, 2008, 2010  . Sleep apnea    does not wear a c-pap, pt. reports that he gave the CPAP back, denies that he has OSA   . Spinal stenosis    Past Surgical History:  Procedure Laterality Date  . ANTERIOR CERVICAL  CORPECTOMY N/A 01/26/2019   Procedure: Cervical Four Corpectomy with Cervical Three-Four, Cervical Four-Five interbody fusion, prosthesis, explore old fusion, possible removal of old plate;  Surgeon: [name], MD;  Location: [facility];  Service: Neurosurgery;  Laterality: N/A;  anterior approach  . CHOLECYSTECTOMY N/A 05/24/2014   Procedure: LAPAROSCOPIC CHOLECYSTECTOMY;  Surgeon: Zenovia Jarred, MD;  Location: [facility];  Service: General;  Laterality: N/A;  . ERCP N/A 05/25/2014   Procedure: ENDOSCOPIC RETROGRADE CHOLANGIOPANCREATOGRAPHY (ERCP);  Surgeon: Beryle Beams, MD;  Location: [facility];  Service: Endoscopy;  Laterality: N/A;  . ERCP N/A 07/16/2014   Procedure: ENDOSCOPIC RETROGRADE CHOLANGIOPANCREATOGRAPHY (ERCP);  Surgeon: Beryle Beams, MD;  Location: [facility];  Service: Endoscopy;  Laterality: N/A;  . ivp filter  jan 2012  . LUMBAR LAMINECTOMY/DECOMPRESSION MICRODISCECTOMY N/A 05/14/2019   Procedure: LAMINECTOMY AND FORAMINOTOMY THORACIC FIVE- THORACIC SIX, THORACIC ELEVEN- THORACIC THORACIC, LUMBAR THREE- LUMBAR FOUR;  Surgeon: [name], MD;  Location: [facility];  Service: Neurosurgery;  Laterality: N/A;  posterior  . LUMBAR LAMINECTOMY/DECOMPRESSION MICRODISCECTOMY N/A 11/11/2019   Procedure: REDO LUMBAR THREE- LUMBAR FOUR LAMINECTOMY/FORAMINOTOMY;  Surgeon: [name], MD;  Location: [facility];  Service: Neurosurgery;  Laterality: N/A;  . SPINE SURGERY  11/30/11   Total 6 back surgeries  . SPINE SURGERY  [id number]   Dr. Cathren Laine in [place], [place]  . [place]     08/2003, cervial spine 2005, 2010 lower back, 2011 lower back, 2012 & 2013  . UPPER GASTROINTESTINAL ENDOSCOPY    . WISDOM TOOTH EXTRACTION     2 removed    FAMILY HISTORY Family History  Problem Relation Age of Onset  . Cancer Mother 51       Lung  . Diabetes Father   . Prostate cancer Father   . Hypertension Father   . Hypertension Brother   . Hypertension Brother   . Diabetes Brother   . Heart  attack Neg Hx   . Stroke Neg Hx   . Colon cancer Neg Hx   . Esophageal cancer Neg Hx   . Pancreatic cancer Neg Hx   . Rectal cancer Neg Hx   . Stomach cancer Neg Hx     SOCIAL HISTORY Social History   Tobacco Use  . Smoking status: Former Smoker    Packs/day: 1.50    Years: 10.00    Pack years: 15.00    Types: Cigarettes    Quit date: 04/26/2001    Years since quitting: 18.9  . Smokeless tobacco: Never Used  Substance Use Topics  . Alcohol use: Yes    Alcohol/week: 1.0 standard drinks    Types: 1 Standard drinks or equivalent per week    Comment: very little- rare  .  Drug use: Yes    Types: Marijuana    Comment: daily- twice per day          OPHTHALMIC EXAM:  Base Eye Exam    Visual Acuity (Snellen - Linear)      Right Left   Dist cc 20/20 -1 20/20 -2       Tonometry (Tonopen, 10:06 AM)      Right Left   Pressure 22 Def       Pupils      Dark Light Shape React APD   Right 5 4 Round Brisk 0   Left 5 4 Round Brisk 0       Visual Fields      Left Right    Full Full       Extraocular Movement      Right Left    Full Full       Neuro/Psych    Oriented x3: Yes   Mood/Affect: Normal       Dilation    Right eye: 1.0% Mydriacyl, 2.5% Phenylephrine @ 10:06 AM        Slit Lamp and Fundus Exam    Slit Lamp Exam      Right Left   Lids/Lashes Dermatochalasis - upper lid Dermatochalasis - upper lid, Meibomian gland dysfunction   Conjunctiva/Sclera mild Melanosis mild Melanosis   Cornea Clear Clear   Anterior Chamber Deep and quiet Deep and quiet   Iris Round and dilated, No NVI Round and dilated, No NVI   Lens 1-2+ Nuclear sclerosis, 2+ Cortical cataract 1-2+ Nuclear sclerosis, 2+ Cortical cataract   Vitreous Vitreous syneresis Vitreous syneresis       Fundus Exam      Right Left   Disc Pink and Sharp Pink and Sharp   C/D Ratio 0.3 0.4   Macula Flat, Good foveal reflex, Retinal pigment epithelial mottling, No heme or edema Flat, Good foveal  reflex, Retinal pigment epithelial mottling, No heme or edema   Vessels Mild Vascular attenuation Mild Vascular attenuation   Periphery Attached, mild patch of lattice with pigment and atrophic hole from [phone], round hole at 0730 Attached, WWP temporally           IMAGING AND PROCEDURES  Imaging and Procedures for @TODAY @  Repair Retinal Breaks, Laser - OD - Right Eye       LASER PROCEDURE NOTE  Procedure:  Barrier laser retinopexy using slit lamp laser, RIGHT eye   Diagnosis:   Lattice degeneration w/ atrophic hole, RIGHT eye                     Patch of lattice: [phone] inferiorly  Surgeon: Bernarda Caffey, MD, PhD  Anesthesia: Topical  Informed consent obtained, operative eye marked, and time out performed prior to initiation of laser.   Laser settings:  Lumenis [id number] laser, slit lamp Lens: Mainster PRP 165 Power: 270 mW Spot size: 200 microns Duration: 30 msec  # spots: 222  Placement of laser: Using a Mainster PRP 165 contact lens at the slit lamp, laser was placed in three confluent rows around patches of lattice w/ atrophic hole spanning 7-730 oclock anterior to equator with additional rows anteriorly.  Complications: None.  Patient tolerated the procedure well and received written and verbal post-procedure care information/education.                 ASSESSMENT/PLAN:    ICD-10-CM   1. Lattice degeneration of right retina  H35.411 Repair Retinal Breaks,  Laser - OD - Right Eye  2. Retinal hole of right eye  H33.321 Repair Retinal Breaks, Laser - OD - Right Eye  3. Retinal edema  H35.81 CANCELED: OCT, Retina - OU - Both Eyes  4. Essential hypertension  I10   5. Hypertensive retinopathy of both eyes  H35.033   6. Combined forms of age-related cataract of both eyes  H25.813   7. Bilateral ocular hypertension  H40.053     1-3. Lattice degeneration w/ atrophic holes, right eye  - lattice with pigment and atrophic hole from 0700-0730, round hole at  0730  - recommend laser retinopexy OD today, 05.05.21  - pt wishes to proceed  - RBA of procedure discussed, questions answered  - informed consent obtained and signed  - see procedure note  - start PF qid OD x7 days  - f/u 3-4 weeks -- POV  4,5. Hypertensive retinopathy OU  - discussed importance of tight BP control  - monitor  6. Mixed cataracts OU  - The symptoms of cataract, surgical options, and treatments and risks were discussed with patient.  - discussed diagnosis and progression  - not yet visually significant  - monitor for now  7. Ocular hypertension / glc suspect  - IOP borderline -- 22 OD  - under the expert management of Dr. Parke Simmers   Ophthalmic Meds Ordered this visit:  Meds ordered this encounter  Medications  . prednisoLONE acetate (PRED FORTE) 1 % ophthalmic suspension    Sig: Place 1 drop into the left eye 4 (four) times daily for 7 days.    Dispense:  10 mL    Refill:  0       Return in about 3 weeks (around 04/20/2020) for f/u lattice degeneration OD, DFE, OCT.  There are no Patient Instructions on file for this visit.   Explained the diagnoses, plan, and follow up with the patient and they expressed understanding.  Patient expressed understanding of the importance of proper follow up care.   Gardiner Sleeper, M.D., Ph.D. Diseases & Surgery of the Retina and [place] 03/30/2020   I have reviewed the above documentation for accuracy and completeness, and I agree with the above. Gardiner Sleeper, M.D., Ph.D. 03/30/20 12:15 PM    Abbreviations: M myopia (nearsighted); A astigmatism; H hyperopia (farsighted); P presbyopia; Mrx spectacle prescription;  CTL contact lenses; OD right eye; OS left eye; OU both eyes  XT exotropia; ET esotropia; PEK punctate epithelial keratitis; PEE punctate epithelial erosions; DES dry eye syndrome; MGD meibomian gland dysfunction; ATs artificial tears; PFAT's preservative free artificial  tears; [place] nuclear sclerotic cataract; PSC posterior subcapsular cataract; ERM epi-retinal membrane; PVD posterior vitreous detachment; RD retinal detachment; DM diabetes mellitus; DR diabetic retinopathy; NPDR non-proliferative diabetic retinopathy; PDR proliferative diabetic retinopathy; CSME clinically significant macular edema; DME diabetic macular edema; dbh dot blot hemorrhages; CWS cotton wool spot; POAG primary open angle glaucoma; C/D cup-to-disc ratio; HVF humphrey visual field; GVF goldmann visual field; OCT optical coherence tomography; IOP intraocular pressure; BRVO Branch retinal vein occlusion; CRVO central retinal vein occlusion; CRAO central retinal artery occlusion; BRAO branch retinal artery occlusion; RT retinal tear; SB scleral buckle; PPV pars plana vitrectomy; VH Vitreous hemorrhage; PRP panretinal laser photocoagulation; IVK intravitreal kenalog; VMT vitreomacular traction; MH Macular hole;  NVD neovascularization of the disc; NVE neovascularization elsewhere; AREDS age related eye disease study; ARMD age related macular degeneration; POAG primary open angle glaucoma; EBMD epithelial/anterior basement membrane dystrophy; ACIOL anterior  chamber intraocular lens; IOL intraocular lens; PCIOL posterior chamber intraocular lens; Phaco/IOL phacoemulsification with intraocular lens placement; Ucon photorefractive keratectomy; LASIK laser assisted in situ keratomileusis; HTN hypertension; DM diabetes mellitus; COPD chronic obstructive pulmonary disease

## 2020-03-30 ENCOUNTER — Encounter (INDEPENDENT_AMBULATORY_CARE_PROVIDER_SITE_OTHER): Payer: Self-pay | Admitting: Ophthalmology

## 2020-03-30 ENCOUNTER — Other Ambulatory Visit: Payer: Self-pay

## 2020-03-30 ENCOUNTER — Ambulatory Visit (INDEPENDENT_AMBULATORY_CARE_PROVIDER_SITE_OTHER): Payer: Medicare HMO | Admitting: Ophthalmology

## 2020-03-30 DIAGNOSIS — H25813 Combined forms of age-related cataract, bilateral: Secondary | ICD-10-CM

## 2020-03-30 DIAGNOSIS — I10 Essential (primary) hypertension: Secondary | ICD-10-CM

## 2020-03-30 DIAGNOSIS — H40053 Ocular hypertension, bilateral: Secondary | ICD-10-CM

## 2020-03-30 DIAGNOSIS — H33321 Round hole, right eye: Secondary | ICD-10-CM | POA: Diagnosis not present

## 2020-03-30 DIAGNOSIS — H3581 Retinal edema: Secondary | ICD-10-CM

## 2020-03-30 DIAGNOSIS — H35411 Lattice degeneration of retina, right eye: Secondary | ICD-10-CM

## 2020-03-30 DIAGNOSIS — H35033 Hypertensive retinopathy, bilateral: Secondary | ICD-10-CM

## 2020-03-30 MED ORDER — PREDNISOLONE ACETATE 1 % OP SUSP: 1.0000 [drp] | Freq: Four times a day (QID) | OPHTHALMIC | 0 refills | Status: AC

## 2020-04-04 NOTE — Progress Notes (Signed)
Virtual Visit via Telephone Note   This visit type was conducted due to national recommendations for restrictions regarding the COVID-19 Pandemic (e.g. social distancing) in an effort to limit this patient's exposure and mitigate transmission in our community.  Due to his co-morbid illnesses, this patient is at least at moderate risk for complications without adequate follow up.  This format is felt to be most appropriate for this patient at this time.  The patient did not have access to video technology/had technical difficulties with video requiring transitioning to audio format only (telephone).  All issues noted in this document were discussed and addressed.  No physical exam could be performed with this format.  Please refer to the patient's chart for his  consent to telehealth for [facility].   The patient was identified using 2 identifiers.  Date:  04/05/2020   ID:  Nicolas [name], DOB November 13, 1966, MRN [id number]  Patient Location: Home Provider Location: Home  PCP:  Maximiano Coss, NP  Cardiologist:  Lauree Chandler, MD   Electrophysiologist:  None   Evaluation Performed:  Follow-Up Visit  Chief Complaint:  F/U  History of Present Illness:    Nicolas [name] is a 54 y.o. male with history of hypertension, recurrent PE status post IVC filter and on chronic Coumadin, lower extremity edema felt secondary to venous insufficiency, morbid obesity.  Patient last saw Dr. Angelena Form in 2017.  Last echo [id number] normal LV systolic function EF 55 to 60% with grade 2 DD.  Patient had thoracic and lumbar laminectomies for spinal stenosis and myelopathy and completed rehab 05/2019.   I saw the patient 10/07/2019 and he was doing much better walking with rehab.  His blood pressure was running high and he was eating a lot of fast food.  I increase his Toprol to 75 mg once daily.  Blood pressure was still running high 10/28/2019 and I added losartan 50 mg daily.   Patient underwent bilateral  redo L2-3 and L3-4 laminectomy/foraminotomies/medial facetectomy to decompress the nerve roots 10/2019 and had a Lovenox bridge.  He had no cardiac complications.  I last had telemedicine visit with him 12/01/2019 and he was doing well.  Patient still having a lot of pain. Starts PT this week. BP doing much better. Watching diet closely. Has lost a lot of weight. Denies chest pain, dyspnea, dizziness or presyncope. Has chronic edema that is unchanged. Only taking lasix 2-3 times/week.   The patient does not have symptoms concerning for COVID-19 infection (fever, chills, cough, or new shortness of breath).    Past Medical History:  Diagnosis Date  . Arthritis   . Clotting disorder ([facility])    pulmonary embolus, DVT  . Depression   . Diabetes mellitus without complication (HCC)    no meds, watching diet  . DVT (deep venous thrombosis) ([facility])    Summer 2012  . Hypertension   . Neuromuscular disorder ([facility])    spinal stenosis.- pt walks with a walker short distance  . Peripheral vascular disease ([facility])   . PONV (postoperative nausea and vomiting)   . Pulmonary embolism ([facility])    x 3 in 2005, 2008, 2010  . Sleep apnea    does not wear a c-pap, pt. reports that he gave the CPAP back, denies that he has OSA   . Spinal stenosis    Past Surgical History:  Procedure Laterality Date  . ANTERIOR CERVICAL CORPECTOMY N/A 01/26/2019   Procedure: Cervical Four Corpectomy with Cervical Three-Four, Cervical Four-Five interbody fusion,  prosthesis, explore old fusion, possible removal of old plate;  Surgeon: [name], MD;  Location: [facility];  Service: Neurosurgery;  Laterality: N/A;  anterior approach  . CHOLECYSTECTOMY N/A 05/24/2014   Procedure: LAPAROSCOPIC CHOLECYSTECTOMY;  Surgeon: Zenovia Jarred, MD;  Location: [facility];  Service: General;  Laterality: N/A;  . ERCP N/A 05/25/2014   Procedure: ENDOSCOPIC RETROGRADE CHOLANGIOPANCREATOGRAPHY (ERCP);  Surgeon: Beryle Beams, MD;  Location: [facility];   Service: Endoscopy;  Laterality: N/A;  . ERCP N/A 07/16/2014   Procedure: ENDOSCOPIC RETROGRADE CHOLANGIOPANCREATOGRAPHY (ERCP);  Surgeon: Beryle Beams, MD;  Location[facility];  Service: Endoscopy;  Laterality: N/A;  . ivp filter  jan 2012  . LUMBAR LAMINECTOMY/DECOMPRESSION MICRODISCECTOMY N/A 05/14/2019   Procedure: LAMINECTOMY AND FORAMINOTOMY THORACIC FIVE- THORACIC SIX, THORACIC ELEVEN- THORACIC THORACIC, LUMBAR THREE- LUMBAR FOUR;  Surgeon: [name], MD;  Location: [facility];  Service: Neurosurgery;  Laterality: N/A;  posterior  . LUMBAR LAMINECTOMY/DECOMPRESSION MICRODISCECTOMY N/A 11/11/2019   Procedure: REDO LUMBAR THREE- LUMBAR FOUR LAMINECTOMY/FORAMINOTOMY;  Surgeon: [name], MD;  Location: [facility];  Service: Neurosurgery;  Laterality: N/A;  . SPINE SURGERY  11/30/11   Total 6 back surgeries  . SPINE SURGERY  [id number]   Dr. Cathren Laine in [place], [place]  . [place]     08/2003, cervial spine 2005, 2010 lower back, 2011 lower back, 2012 & 2013  . UPPER GASTROINTESTINAL ENDOSCOPY    . WISDOM TOOTH EXTRACTION     2 removed     Current Meds  Medication Sig  . baclofen (LIORESAL) 10 MG tablet Take 0.5 tablets (5 mg total) by mouth at bedtime.  . cyclobenzaprine (FLEXERIL) 10 MG tablet Take 1 tablet (10 mg total) by mouth 3 (three) times daily as needed for muscle spasms.  . furosemide (LASIX) 40 MG tablet TAKE 1 TABLET EVERY DAY, NEED MD APPOINTMENT WITH CARDS FOR REFILLS (Patient taking differently: Take 40 mg by mouth daily. )  . gabapentin (NEURONTIN) 300 MG capsule Take 1 capsule (300 mg total) by mouth 3 (three) times daily.  Marland Kitchen levothyroxine (SYNTHROID) 25 MCG tablet Take 1 tablet (25 mcg total) by mouth daily before breakfast.  . losartan (COZAAR) 50 MG tablet Take 1 tablet (50 mg total) by mouth daily.  . metoprolol succinate (TOPROL-XL) 50 MG 24 hr tablet Take 1.5 tablets (75 mg total) by mouth once a day (Patient taking differently: Take 75 mg by mouth  daily after breakfast. Take 1.5 tablets (75 mg total) by mouth once a day)  . Multiple Vitamin (MULTIVITAMIN WITH MINERALS) TABS tablet Take 1 tablet by mouth daily.   Marland Kitchen oxyCODONE 10 MG TABS Take 0.5-1 tablets (5-10 mg total) by mouth every 4 (four) hours as needed for severe pain ((score 7 to 10)).  Marland Kitchen potassium chloride SA (K-DUR) 20 MEQ tablet TAKE 1 TABLET EVERY DAY, NEED MD APPOINTMENT WITH CARDS FOR REFILLS (Patient taking differently: Take 20 mEq by mouth daily. )  . prednisoLONE acetate (PRED FORTE) 1 % ophthalmic suspension Place 1 drop into the left eye 4 (four) times daily for 7 days.  Marland Kitchen warfarin (COUMADIN) 10 MG tablet TAKE 1 TABLET DAILY EXCEPT 1/2 TABLET ON Monday, Wednesday, Friday OR AS DIRECTED BY COUMADIN CLINIC.     Allergies:   Ace inhibitors and Lisinopril   Social History   Tobacco Use  . Smoking status: Former Smoker    Packs/day: 1.50    Years: 10.00    Pack years: 15.00    Types: Cigarettes  Quit date: 04/26/2001    Years since quitting: 18.9  . Smokeless tobacco: Never Used  Substance Use Topics  . Alcohol use: Yes    Alcohol/week: 1.0 standard drinks    Types: 1 Standard drinks or equivalent per week    Comment: very little- rare  . Drug use: Yes    Types: Marijuana    Comment: daily- twice per day      Family Hx: The patient's family history includes Cancer (age of onset: 65) in his mother; Diabetes in his brother and father; Hypertension in his brother, brother, and father; Prostate cancer in his father. There is no history of Heart attack, Stroke, Colon cancer, Esophageal cancer, Pancreatic cancer, Rectal cancer, or Stomach cancer.  ROS:   Please see the history of present illness.      All other systems reviewed and are negative.   Prior CV studies:   The following studies were reviewed today:   Echocardiogram 05/23/2014 Study Conclusions - Left ventricle: The cavity size was normal. Wall thickness was   normal. Systolic function was normal.  The estimated ejection   fraction was in the range of 55% to 60%. Features are consistent   with a pseudonormal left ventricular filling pattern, with   concomitant abnormal relaxation and increased filling pressure   (grade 2 diastolic dysfunction). - Impressions: Technically limited study due to poor sound wave   transmission.  Impressions: - Technically limited study due to poor sound wave transmission.      Labs/Other Tests and Data Reviewed:    EKG:  No ECG reviewed.  Recent Labs: 03/18/2020: ALT 14; BUN 13; Creatinine, Ser 0.67; Hemoglobin 14.8; Platelets 220; Potassium 4.2; Sodium 144; TSH 6.860   Recent Lipid Panel Lab Results  Component Value Date/Time   CHOL 155 03/18/2020 04:02 PM   TRIG 151 (H) 03/18/2020 04:02 PM   HDL 42 03/18/2020 04:02 PM   CHOLHDL 3.7 03/18/2020 04:02 PM   CHOLHDL 3.4 10/04/2016 09:46 AM   LDLCALC 87 03/18/2020 04:02 PM   LDLDIRECT 82 05/01/2013 03:45 PM    Wt Readings from Last 3 Encounters:  04/05/20 269 lb (122 kg)  03/18/20 259 lb (117.5 kg)  12/01/19 300 lb (136.1 kg)     Objective:    Vital Signs:  BP 128/87   Pulse 88   Ht 5\' 10"  (1.778 m)   Wt 269 lb (122 kg)   BMI 38.60 kg/m    VITAL SIGNS:  reviewed  ASSESSMENT & PLAN:    1. Essential hypertension controlled on Toprol and losartan 2. Recurrent PE status post IVC filter and on chronic Coumadin-no bleeding problems, recent labs stable 3. Lower extremity edema felt secondary to venous stasis, chronic DVTs and obesity.  He takes Lasix 2-3 days a week-says he'll try to take daily 4. Morbid obesity-has lost a lot of weight in the past year 5. Covid 19 vaccination education given 6. Hyperlipidemia-LDL 87, triglycerides 151-limit sugars in diet.  COVID-19 Education: The signs and symptoms of COVID-19 were discussed with the patient and how to seek care for testing (follow up with PCP or arrange E-visit).   The importance of social distancing was discussed today.  Time:    Today, I have spent 11 minutes with the patient with telehealth technology discussing the above problems.     Medication Adjustments/Labs and Tests Ordered: Current medicines are reviewed at length with the patient today.  Concerns regarding medicines are outlined above.   Tests Ordered: No orders of the defined  types were placed in this encounter.   Medication Changes: No orders of the defined types were placed in this encounter.   Follow Up:  Either In Person or Virtual in 6 month(s) Dr. Angelena Form  Signed, Ermalinda Barrios, PA-C  04/05/2020 10:26 AM    [facility]

## 2020-04-05 ENCOUNTER — Encounter: Payer: Self-pay | Admitting: Physician Assistant

## 2020-04-05 ENCOUNTER — Other Ambulatory Visit: Payer: Self-pay

## 2020-04-05 ENCOUNTER — Telehealth (INDEPENDENT_AMBULATORY_CARE_PROVIDER_SITE_OTHER): Payer: Medicare HMO | Admitting: Physician Assistant

## 2020-04-05 ENCOUNTER — Telehealth: Payer: Self-pay | Admitting: *Deleted

## 2020-04-05 VITALS — BP 128/87 | HR 88 | Ht 70.0 in | Wt 269.0 lb

## 2020-04-05 DIAGNOSIS — Z7189 Other specified counseling: Secondary | ICD-10-CM | POA: Diagnosis not present

## 2020-04-05 DIAGNOSIS — E782 Mixed hyperlipidemia: Secondary | ICD-10-CM

## 2020-04-05 DIAGNOSIS — I10 Essential (primary) hypertension: Secondary | ICD-10-CM

## 2020-04-05 DIAGNOSIS — Z86711 Personal history of pulmonary embolism: Secondary | ICD-10-CM | POA: Diagnosis not present

## 2020-04-05 DIAGNOSIS — R600 Localized edema: Secondary | ICD-10-CM | POA: Diagnosis not present

## 2020-04-05 NOTE — Patient Instructions (Signed)
Medication Instructions:  Your physician recommends that you continue on your current medications as directed. Please refer to the Current Medication list given to you today.  BE SURE TO TAKE YOUR LASIX AND POTASSIUM DAILY  *If you need a refill on your cardiac medications before your next appointment, please call your pharmacy*   Lab Work: None ordered  If you have labs (blood work) drawn today and your tests are completely normal, you will receive your results only by: Marland Kitchen MyChart Message (if you have MyChart) OR . A paper copy in the mail If you have any lab test that is abnormal or we need to change your treatment, we will call you to review the results.   Testing/Procedures: None ordered   Follow-Up: At [facility], you and your health needs are our priority.  As part of our continuing mission to provide you with exceptional heart care, we have created designated Provider Care Teams.  These Care Teams include your primary Cardiologist (physician) and Advanced Practice Providers (APPs -  Physician Assistants and Nurse Practitioners) who all work together to provide you with the care you need, when you need it.  We recommend signing up for the patient portal called "MyChart".  Sign up information is provided on this After Visit Summary.  MyChart is used to connect with patients for Virtual Visits (Telemedicine).  Patients are able to view lab/test results, encounter notes, upcoming appointments, etc.  Non-urgent messages can be sent to your provider as well.   To learn more about what you can do with MyChart, go to [email or web address].    Your next appointment:   6 month(s)  The format for your next appointment:   Either In Person or Virtual  Provider:   You may see Lauree Chandler, MD or one of the following Advanced Practice Providers on your designated Care Team:    Melina Copa, PA-C  Ermalinda Barrios, PA-C    Other Instructions We are recommending the COVID-19  vaccine to all of our patients. Cardiac medications (including blood thinners) should not deter anyone from being vaccinated and there is no need to hold any of those medications prior to vaccine administration.   Currently, there is a hotline to call (active 12/04/19) to schedule vaccination appointments as no walk-ins will be accepted.    Vaccines through the health department can be arranged by calling [phone]    Vaccines through [facility] can be arranged by calling [phone] or visiting [email or web address]   If you have further questions or concerns about the vaccine process, please visit [email or web address], [email or web address], or contact your primary care physician.

## 2020-04-05 NOTE — Telephone Encounter (Signed)
  Patient Consent for Virtual Visit         Nicolas [name] has provided verbal consent on 04/05/2020 for a virtual visit (video or telephone).   CONSENT FOR VIRTUAL VISIT FOR:  Nicolas [name]  By participating in this virtual visit I agree to the following:  I hereby voluntarily request, consent and authorize [facility] and its employed or contracted physicians, physician assistants, nurse practitioners or other licensed health care professionals (the Practitioner), to provide me with telemedicine health care services (the "Services") as deemed necessary by the treating Practitioner. I acknowledge and consent to receive the Services by the Practitioner via telemedicine. I understand that the telemedicine visit will involve communicating with the Practitioner through live audiovisual communication technology and the disclosure of certain medical information by electronic transmission. I acknowledge that I have been given the opportunity to request an in-person assessment or other available alternative prior to the telemedicine visit and am voluntarily participating in the telemedicine visit.  I understand that I have the right to withhold or withdraw my consent to the use of telemedicine in the course of my care at any time, without affecting my right to future care or treatment, and that the Practitioner or I may terminate the telemedicine visit at any time. I understand that I have the right to inspect all information obtained and/or recorded in the course of the telemedicine visit and may receive copies of available information for a reasonable fee.  I understand that some of the potential risks of receiving the Services via telemedicine include:  Marland Kitchen Delay or interruption in medical evaluation due to technological equipment failure or disruption; . Information transmitted may not be sufficient (e.g. poor resolution of images) to allow for appropriate medical decision making by the Practitioner;  and/or  . In rare instances, security protocols could fail, causing a breach of personal health information.  Furthermore, I acknowledge that it is my responsibility to provide information about my medical history, conditions and care that is complete and accurate to the best of my ability. I acknowledge that Practitioner's advice, recommendations, and/or decision may be based on factors not within their control, such as incomplete or inaccurate data provided by me or distortions of diagnostic images or specimens that may result from electronic transmissions. I understand that the practice of medicine is not an exact science and that Practitioner makes no warranties or guarantees regarding treatment outcomes. I acknowledge that a copy of this consent can be made available to me via my patient portal ([facility]), or I can request a printed copy by calling the office of [facility].    I understand that my insurance will be billed for this visit.   I have read or had this consent read to me. . I understand the contents of this consent, which adequately explains the benefits and risks of the Services being provided via telemedicine.  . I have been provided ample opportunity to ask questions regarding this consent and the Services and have had my questions answered to my satisfaction. . I give my informed consent for the services to be provided through the use of telemedicine in my medical care

## 2020-04-06 ENCOUNTER — Other Ambulatory Visit: Payer: Self-pay

## 2020-04-06 ENCOUNTER — Ambulatory Visit: Payer: Medicare HMO | Admitting: Physical Therapy

## 2020-04-06 ENCOUNTER — Telehealth: Payer: Self-pay | Admitting: Registered Nurse

## 2020-04-06 NOTE — Telephone Encounter (Signed)
Pt called and is wanting to see if he can get a different referral to physical therapy. Pt is wanting to go to [facility] on [address] [place], [place],  [address]. Pt is wanting this location for cost purposes. Please advise.

## 2020-04-06 NOTE — Telephone Encounter (Signed)
Nicolas [name] see message concerning PT change per the patient. Thank you

## 2020-04-08 ENCOUNTER — Other Ambulatory Visit: Payer: Self-pay | Admitting: Registered Nurse

## 2020-04-08 DIAGNOSIS — M4712 Other spondylosis with myelopathy, cervical region: Secondary | ICD-10-CM

## 2020-04-08 NOTE — Telephone Encounter (Signed)
Patient would like to change Physical Therapy referral. Please Advise.

## 2020-04-08 NOTE — Telephone Encounter (Signed)
Called and informed patient that the referral was sent.

## 2020-04-08 NOTE — Telephone Encounter (Signed)
Sent ? ?Thanks, ? ?Rich

## 2020-04-14 DIAGNOSIS — M4800 Spinal stenosis, site unspecified: Secondary | ICD-10-CM | POA: Diagnosis not present

## 2020-04-14 DIAGNOSIS — R262 Difficulty in walking, not elsewhere classified: Secondary | ICD-10-CM | POA: Diagnosis not present

## 2020-04-14 DIAGNOSIS — M6281 Muscle weakness (generalized): Secondary | ICD-10-CM | POA: Diagnosis not present

## 2020-04-14 DIAGNOSIS — M4710 Other spondylosis with myelopathy, site unspecified: Secondary | ICD-10-CM | POA: Diagnosis not present

## 2020-04-14 DIAGNOSIS — R2681 Unsteadiness on feet: Secondary | ICD-10-CM | POA: Diagnosis not present

## 2020-04-19 DIAGNOSIS — R262 Difficulty in walking, not elsewhere classified: Secondary | ICD-10-CM | POA: Diagnosis not present

## 2020-04-19 DIAGNOSIS — M6281 Muscle weakness (generalized): Secondary | ICD-10-CM | POA: Diagnosis not present

## 2020-04-19 DIAGNOSIS — R2681 Unsteadiness on feet: Secondary | ICD-10-CM | POA: Diagnosis not present

## 2020-04-19 DIAGNOSIS — M4800 Spinal stenosis, site unspecified: Secondary | ICD-10-CM | POA: Diagnosis not present

## 2020-04-19 DIAGNOSIS — M4710 Other spondylosis with myelopathy, site unspecified: Secondary | ICD-10-CM | POA: Diagnosis not present

## 2020-04-21 NOTE — Progress Notes (Signed)
[facility] Note  04/27/2020     CHIEF COMPLAINT Patient presents for Retina Follow Up   HISTORY OF PRESENT ILLNESS: Nicolas [name] is a 54 y.o. male who presents to the clinic today for:   HPI    Retina Follow Up    Patient presents with  Other.  In right eye.  This started 3 weeks ago.  Severity is moderate.  I, the attending physician,  performed the HPI with the patient and updated documentation appropriately.          Comments    Patient here for 3 weeks retina follow up for lattice degeneration OD. Patient states vision doing fine. No eye pain.        Last edited by Bernarda Caffey, MD on 04/27/2020  3:00 PM. (History)      Referring physician: Maximiano Coss, NP [place],  [place] [address]  HISTORICAL INFORMATION:   Selected notes from the MEDICAL RECORD NUMBER Referred by Dr. Martinique [name] for concern of retinal holes OU   CURRENT MEDICATIONS: No current outpatient medications on file. (Ophthalmic Drugs)   No current facility-administered medications for this visit. (Ophthalmic Drugs)   Current Outpatient Medications (Other)  Medication Sig  . baclofen (LIORESAL) 10 MG tablet Take 0.5 tablets (5 mg total) by mouth at bedtime.  . cyclobenzaprine (FLEXERIL) 10 MG tablet Take 1 tablet (10 mg total) by mouth 3 (three) times daily as needed for muscle spasms.  . furosemide (LASIX) 40 MG tablet TAKE 1 TABLET EVERY DAY, NEED MD APPOINTMENT WITH CARDS FOR REFILLS (Patient taking differently: Take 40 mg by mouth daily. )  . gabapentin (NEURONTIN) 300 MG capsule Take 1 capsule (300 mg total) by mouth 3 (three) times daily.  Marland Kitchen levothyroxine (SYNTHROID) 25 MCG tablet Take 1 tablet (25 mcg total) by mouth daily before breakfast.  . losartan (COZAAR) 50 MG tablet Take 1 tablet (50 mg total) by mouth daily.  . metoprolol succinate (TOPROL-XL) 50 MG 24 hr tablet Take 1.5 tablets (75 mg total) by mouth once a day (Patient taking differently:  Take 75 mg by mouth daily after breakfast. Take 1.5 tablets (75 mg total) by mouth once a day)  . Multiple Vitamin (MULTIVITAMIN WITH MINERALS) TABS tablet Take 1 tablet by mouth daily.   Marland Kitchen oxyCODONE 10 MG TABS Take 0.5-1 tablets (5-10 mg total) by mouth every 4 (four) hours as needed for severe pain ((score 7 to 10)).  Marland Kitchen potassium chloride SA (K-DUR) 20 MEQ tablet TAKE 1 TABLET EVERY DAY, NEED MD APPOINTMENT WITH CARDS FOR REFILLS (Patient taking differently: Take 20 mEq by mouth daily. )  . warfarin (COUMADIN) 10 MG tablet TAKE 1 TABLET DAILY EXCEPT 1/2 TABLET ON Monday, Wednesday, Friday OR AS DIRECTED BY COUMADIN CLINIC.   No current facility-administered medications for this visit. (Other)      REVIEW OF SYSTEMS: ROS    Positive for: Eyes   Last edited by Theodore Demark, COA on 04/27/2020  2:24 PM. (History)       ALLERGIES Allergies  Allergen Reactions  . Ace Inhibitors Cough  . Lisinopril Cough    PAST MEDICAL HISTORY Past Medical History:  Diagnosis Date  . Arthritis   . Clotting disorder ([place])    pulmonary embolus, DVT  . Depression   . Diabetes mellitus without complication (H[facility])    no meds, watching diet  . DVT (deep venous thrombosis) ([place])    Summer 2012  . Hypertension   .  Neuromuscular disorder ([place])    spinal stenosis.- pt walks with a walker short distance  . Peripheral vascular disease ([place])   . PONV (postoperative nausea and vomiting)   . Pulmonary embolism ([place])    x 3 in 2005, 2008, 2010  . Sleep apnea    does not wear a c-pap, pt. reports that he gave the CPAP back, denies that he has OSA   . Spinal stenosis    Past Surgical History:  Procedure Laterality Date  . ANTERIOR CERVICAL CORPECTOMY N/A 01/26/2019   Procedure: Cervical Four Corpectomy with Cervical Three-Four, Cervical Four-Five interbody fusion, prosthesis, explore old fusion, possible removal of old plate;  Surgeon: [name], MD;  Location: [facility];  Service: Neurosurgery;   Laterality: N/A;  anterior approach  . CHOLECYSTECTOMY N/A 05/24/2014   Procedure: LAPAROSCOPIC CHOLECYSTECTOMY;  Surgeon: Zenovia Jarred, MD;  Location: [facility];  Service: General;  Laterality: N/A;  . ERCP N/A 05/25/2014   Procedure: ENDOSCOPIC RETROGRADE CHOLANGIOPANCREATOGRAPHY (ERCP);  Surgeon: Beryle Beams, MD;  Location: [facility];  Service: Endoscopy;  Laterality: N/A;  . ERCP N/A 07/16/2014   Procedure: ENDOSCOPIC RETROGRADE CHOLANGIOPANCREATOGRAPHY (ERCP);  Surgeon: Beryle Beams, MD;  Location: [facility];  Service: Endoscopy;  Laterality: N/A;  . ivp filter  jan 2012  . LUMBAR LAMINECTOMY/DECOMPRESSION MICRODISCECTOMY N/A 05/14/2019   Procedure: LAMINECTOMY AND FORAMINOTOMY THORACIC FIVE- THORACIC SIX, THORACIC ELEVEN- THORACIC THORACIC, LUMBAR THREE- LUMBAR FOUR;  Surgeon: [name], MD;  Location: [facility];  Service: Neurosurgery;  Laterality: N/A;  posterior  . LUMBAR LAMINECTOMY/DECOMPRESSION MICRODISCECTOMY N/A 11/11/2019   Procedure: REDO LUMBAR THREE- LUMBAR FOUR LAMINECTOMY/FORAMINOTOMY;  Surgeon: [name], MD;  Location: [facility];  Service: Neurosurgery;  Laterality: N/A;  . SPINE SURGERY  11/30/11   Total 6 back surgeries  . SPINE SURGERY  [id number]   Dr. Cathren Laine in [place], [place]  . [place]     08/2003, cervial spine 2005, 2010 lower back, 2011 lower back, 2012 & 2013  . UPPER GASTROINTESTINAL ENDOSCOPY    . WISDOM TOOTH EXTRACTION     2 removed    FAMILY HISTORY Family History  Problem Relation Age of Onset  . Cancer Mother 30       Lung  . Diabetes Father   . Prostate cancer Father   . Hypertension Father   . Hypertension Brother   . Hypertension Brother   . Diabetes Brother   . Heart attack Neg Hx   . Stroke Neg Hx   . Colon cancer Neg Hx   . Esophageal cancer Neg Hx   . Pancreatic cancer Neg Hx   . Rectal cancer Neg Hx   . Stomach cancer Neg Hx     SOCIAL HISTORY Social History   Tobacco Use  . Smoking status: Former Smoker     Packs/day: 1.50    Years: 10.00    Pack years: 15.00    Types: Cigarettes    Quit date: 04/26/2001    Years since quitting: 19.0  . Smokeless tobacco: Never Used  Substance Use Topics  . Alcohol use: Yes    Alcohol/week: 1.0 standard drinks    Types: 1 Standard drinks or equivalent per week    Comment: very little- rare  . Drug use: Yes    Types: Marijuana    Comment: daily- twice per day          OPHTHALMIC EXAM:  Base Eye Exam    Visual Acuity (Snellen - Linear)  Right Left   Dist [place] 20/20 20/20       Tonometry (Tonopen, 2:21 PM)      Right Left   Pressure 18 20       Pupils      Dark Light Shape React APD   Right 5 4 Round Brisk None   Left 5 4 Round Brisk None       Visual Fields (Counting fingers)      Left Right    Full Full       Extraocular Movement      Right Left    Full, Ortho Full, Ortho       Neuro/Psych    Oriented x3: Yes   Mood/Affect: Normal       Dilation    Both eyes: 1.0% Mydriacyl, 2.5% Phenylephrine @ 2:21 PM        Slit Lamp and Fundus Exam    Slit Lamp Exam      Right Left   Lids/Lashes Dermatochalasis - upper lid Dermatochalasis - upper lid, Meibomian gland dysfunction   Conjunctiva/Sclera mild Melanosis mild Melanosis   Cornea Clear Clear   Anterior Chamber Deep and quiet Deep and quiet   Iris Round and dilated, No NVI Round and dilated, No NVI   Lens 1-2+ Nuclear sclerosis, 2+ Cortical cataract 1-2+ Nuclear sclerosis, 2+ Cortical cataract   Vitreous Vitreous syneresis Vitreous syneresis       Fundus Exam      Right Left   Disc Pink and Sharp Pink and Sharp   C/D Ratio 0.3 0.4   Macula Flat, Good foveal reflex, Retinal pigment epithelial mottling, No heme or edema Flat, Good foveal reflex, Retinal pigment epithelial mottling, No heme or edema   Vessels Mild Vascular attenuation Mild Vascular attenuation   Periphery Attached, mild patch of lattice with pigment and atrophic hole from 0700-0730, round hole at  0730--good laser surrounding.  No new RT/RD. Attached, WWP temporally.  No RT/RD.          IMAGING AND PROCEDURES  Imaging and Procedures for @TODAY @  OCT, Retina - OU - Both Eyes       Right Eye Quality was good. Central Foveal Thickness: 272. Progression has been stable. Findings include normal foveal contour, no IRF, no SRF, vitreomacular adhesion .   Left Eye Central Foveal Thickness: 265. Progression has been stable. Findings include normal foveal contour, no SRF, no IRF, vitreomacular adhesion .   Notes *Images captured and stored on drive  Diagnosis / Impression:  NFP, no IRF/SRF OU +VMA OU  Clinical management:  See below  Abbreviations: NFP - Normal foveal profile. CME - cystoid macular edema. PED - pigment epithelial detachment. IRF - intraretinal fluid. SRF - subretinal fluid. EZ - ellipsoid zone. ERM - epiretinal membrane. ORA - outer retinal atrophy. ORT - outer retinal tubulation. SRHM - subretinal hyper-reflective material                 ASSESSMENT/PLAN:    ICD-10-CM   1. Lattice degeneration of right retina  H35.411   2. Retinal hole of right eye  H33.321   3. Retinal edema  H35.81 OCT, Retina - OU - Both Eyes  4. Essential hypertension  I10   5. Hypertensive retinopathy of both eyes  H35.033   6. Combined forms of age-related cataract of both eyes  H25.813   7. Bilateral ocular hypertension  H40.053     1-3. Lattice degeneration w/ atrophic holes, right eye  - lattice with  pigment and atrophic hole from 0700-0730, round hole at 0730  - laser retinopexy OD 05.05.21 -- good laser in place  - no new lesions or RT/RD  - f/u 3 mos - POV  4,5. Hypertensive retinopathy OU  - discussed importance of tight BP control  - monitor  6. Mixed cataracts OU  - The symptoms of cataract, surgical options, and treatments and risks were discussed with patient.  - discussed diagnosis and progression  - not yet visually significant  - monitor for  now  7. Ocular hypertension / glc suspect  - IOP 18, 20  - under the expert management of Dr. Parke Simmers   Ophthalmic Meds Ordered this visit:  No orders of the defined types were placed in this encounter.      Return in about 3 months (around 07/28/2020) for 3 mo POV s/p laser retinopexy OD.  There are no Patient Instructions on file for this visit.   Explained the diagnoses, plan, and follow up with the patient and they expressed understanding.  Patient expressed understanding of the importance of proper follow up care.   Gardiner Sleeper, M.D., Ph.D. Diseases & Surgery of the Retina and Vitreous [facility]  I have reviewed the above documentation for accuracy and completeness, and I agree with the above. Gardiner Sleeper, M.D., Ph.D. 04/29/20 11:25 PM    Abbreviations: M myopia (nearsighted); A astigmatism; H hyperopia (farsighted); P presbyopia; Mrx spectacle prescription;  CTL contact lenses; OD right eye; OS left eye; OU both eyes  XT exotropia; ET esotropia; PEK punctate epithelial keratitis; PEE punctate epithelial erosions; DES dry eye syndrome; MGD meibomian gland dysfunction; ATs artificial tears; PFAT's preservative free artificial tears; [place] nuclear sclerotic cataract; PSC posterior subcapsular cataract; ERM epi-retinal membrane; PVD posterior vitreous detachment; RD retinal detachment; DM diabetes mellitus; DR diabetic retinopathy; NPDR non-proliferative diabetic retinopathy; PDR proliferative diabetic retinopathy; CSME clinically significant macular edema; DME diabetic macular edema; dbh dot blot hemorrhages; CWS cotton wool spot; POAG primary open angle glaucoma; C/D cup-to-disc ratio; HVF humphrey visual field; GVF goldmann visual field; OCT optical coherence tomography; IOP intraocular pressure; BRVO Branch retinal vein occlusion; CRVO central retinal vein occlusion; CRAO central retinal artery occlusion; BRAO branch retinal artery occlusion; RT retinal  tear; SB scleral buckle; PPV pars plana vitrectomy; VH Vitreous hemorrhage; PRP panretinal laser photocoagulation; IVK intravitreal kenalog; VMT vitreomacular traction; MH Macular hole;  NVD neovascularization of the disc; NVE neovascularization elsewhere; AREDS age related eye disease study; ARMD age related macular degeneration; POAG primary open angle glaucoma; EBMD epithelial/anterior basement membrane dystrophy; ACIOL anterior chamber intraocular lens; IOL intraocular lens; PCIOL posterior chamber intraocular lens; Phaco/IOL phacoemulsification with intraocular lens placement; [place] photorefractive keratectomy; LASIK laser assisted in situ keratomileusis; HTN hypertension; DM diabetes mellitus; COPD chronic obstructive pulmonary disease

## 2020-04-27 ENCOUNTER — Other Ambulatory Visit: Payer: Self-pay

## 2020-04-27 ENCOUNTER — Encounter (INDEPENDENT_AMBULATORY_CARE_PROVIDER_SITE_OTHER): Payer: Self-pay | Admitting: Ophthalmology

## 2020-04-27 ENCOUNTER — Ambulatory Visit (INDEPENDENT_AMBULATORY_CARE_PROVIDER_SITE_OTHER): Payer: Medicare HMO | Admitting: Ophthalmology

## 2020-04-27 DIAGNOSIS — H35411 Lattice degeneration of retina, right eye: Secondary | ICD-10-CM

## 2020-04-27 DIAGNOSIS — H33321 Round hole, right eye: Secondary | ICD-10-CM

## 2020-04-27 DIAGNOSIS — H3581 Retinal edema: Secondary | ICD-10-CM | POA: Diagnosis not present

## 2020-04-27 DIAGNOSIS — H25813 Combined forms of age-related cataract, bilateral: Secondary | ICD-10-CM

## 2020-04-27 DIAGNOSIS — H40053 Ocular hypertension, bilateral: Secondary | ICD-10-CM

## 2020-04-27 DIAGNOSIS — I10 Essential (primary) hypertension: Secondary | ICD-10-CM

## 2020-04-27 DIAGNOSIS — H35033 Hypertensive retinopathy, bilateral: Secondary | ICD-10-CM

## 2020-04-29 ENCOUNTER — Encounter (INDEPENDENT_AMBULATORY_CARE_PROVIDER_SITE_OTHER): Payer: Self-pay | Admitting: Ophthalmology

## 2020-04-29 DIAGNOSIS — R2681 Unsteadiness on feet: Secondary | ICD-10-CM | POA: Diagnosis not present

## 2020-04-29 DIAGNOSIS — R262 Difficulty in walking, not elsewhere classified: Secondary | ICD-10-CM | POA: Diagnosis not present

## 2020-04-29 DIAGNOSIS — M4800 Spinal stenosis, site unspecified: Secondary | ICD-10-CM | POA: Diagnosis not present

## 2020-04-29 DIAGNOSIS — M4710 Other spondylosis with myelopathy, site unspecified: Secondary | ICD-10-CM | POA: Diagnosis not present

## 2020-04-29 DIAGNOSIS — M6281 Muscle weakness (generalized): Secondary | ICD-10-CM | POA: Diagnosis not present

## 2020-05-02 ENCOUNTER — Other Ambulatory Visit: Payer: Self-pay

## 2020-05-02 ENCOUNTER — Ambulatory Visit (INDEPENDENT_AMBULATORY_CARE_PROVIDER_SITE_OTHER): Payer: Medicare HMO | Admitting: *Deleted

## 2020-05-02 DIAGNOSIS — Z86711 Personal history of pulmonary embolism: Secondary | ICD-10-CM

## 2020-05-02 DIAGNOSIS — Z5181 Encounter for therapeutic drug level monitoring: Secondary | ICD-10-CM

## 2020-05-02 DIAGNOSIS — M4710 Other spondylosis with myelopathy, site unspecified: Secondary | ICD-10-CM | POA: Diagnosis not present

## 2020-05-02 DIAGNOSIS — Z7901 Long term (current) use of anticoagulants: Secondary | ICD-10-CM

## 2020-05-02 DIAGNOSIS — R262 Difficulty in walking, not elsewhere classified: Secondary | ICD-10-CM | POA: Diagnosis not present

## 2020-05-02 DIAGNOSIS — R2681 Unsteadiness on feet: Secondary | ICD-10-CM | POA: Diagnosis not present

## 2020-05-02 DIAGNOSIS — M6281 Muscle weakness (generalized): Secondary | ICD-10-CM | POA: Diagnosis not present

## 2020-05-02 DIAGNOSIS — M4800 Spinal stenosis, site unspecified: Secondary | ICD-10-CM | POA: Diagnosis not present

## 2020-05-02 LAB — POCT INR: INR: 3.5 — AB (ref 2.0–3.0)

## 2020-05-02 NOTE — Patient Instructions (Addendum)
Description   Hold warfarin today, then continue taking 1/2 a tablet daily except for 1 tablet on Tuesdays, Thursdays and Saturdays.  Recheck INR in 3 weeks. Call us with any changes # [phone].

## 2020-05-11 DIAGNOSIS — R262 Difficulty in walking, not elsewhere classified: Secondary | ICD-10-CM | POA: Diagnosis not present

## 2020-05-11 DIAGNOSIS — R2681 Unsteadiness on feet: Secondary | ICD-10-CM | POA: Diagnosis not present

## 2020-05-11 DIAGNOSIS — M4800 Spinal stenosis, site unspecified: Secondary | ICD-10-CM | POA: Diagnosis not present

## 2020-05-11 DIAGNOSIS — M6281 Muscle weakness (generalized): Secondary | ICD-10-CM | POA: Diagnosis not present

## 2020-05-11 DIAGNOSIS — M4710 Other spondylosis with myelopathy, site unspecified: Secondary | ICD-10-CM | POA: Diagnosis not present

## 2020-05-16 ENCOUNTER — Other Ambulatory Visit: Payer: Self-pay | Admitting: Physical Medicine & Rehabilitation

## 2020-05-16 DIAGNOSIS — M4714 Other spondylosis with myelopathy, thoracic region: Secondary | ICD-10-CM

## 2020-05-17 DIAGNOSIS — R262 Difficulty in walking, not elsewhere classified: Secondary | ICD-10-CM | POA: Diagnosis not present

## 2020-05-17 DIAGNOSIS — M4710 Other spondylosis with myelopathy, site unspecified: Secondary | ICD-10-CM | POA: Diagnosis not present

## 2020-05-17 DIAGNOSIS — R2681 Unsteadiness on feet: Secondary | ICD-10-CM | POA: Diagnosis not present

## 2020-05-17 DIAGNOSIS — M4800 Spinal stenosis, site unspecified: Secondary | ICD-10-CM | POA: Diagnosis not present

## 2020-05-17 DIAGNOSIS — M6281 Muscle weakness (generalized): Secondary | ICD-10-CM | POA: Diagnosis not present

## 2020-05-17 NOTE — Addendum Note (Signed)
Addended by: Gardiner Sleeper on: 05/17/2020 01:02 AM   Modules accepted: Level of Service

## 2020-05-19 DIAGNOSIS — R2681 Unsteadiness on feet: Secondary | ICD-10-CM | POA: Diagnosis not present

## 2020-05-19 DIAGNOSIS — R262 Difficulty in walking, not elsewhere classified: Secondary | ICD-10-CM | POA: Diagnosis not present

## 2020-05-19 DIAGNOSIS — M6281 Muscle weakness (generalized): Secondary | ICD-10-CM | POA: Diagnosis not present

## 2020-05-19 DIAGNOSIS — M4800 Spinal stenosis, site unspecified: Secondary | ICD-10-CM | POA: Diagnosis not present

## 2020-05-19 DIAGNOSIS — M4710 Other spondylosis with myelopathy, site unspecified: Secondary | ICD-10-CM | POA: Diagnosis not present

## 2020-05-20 IMAGING — CR DG LUMBAR SPINE 2-3V
3 series · 3 of 3 positions shown · non-contrast
Comparison: MRI 10/26/2019

CLINICAL DATA: L3-4 laminectomy

EXAM:
LUMBAR SPINE - 2-3 VIEW

[AP (1 of 3)]
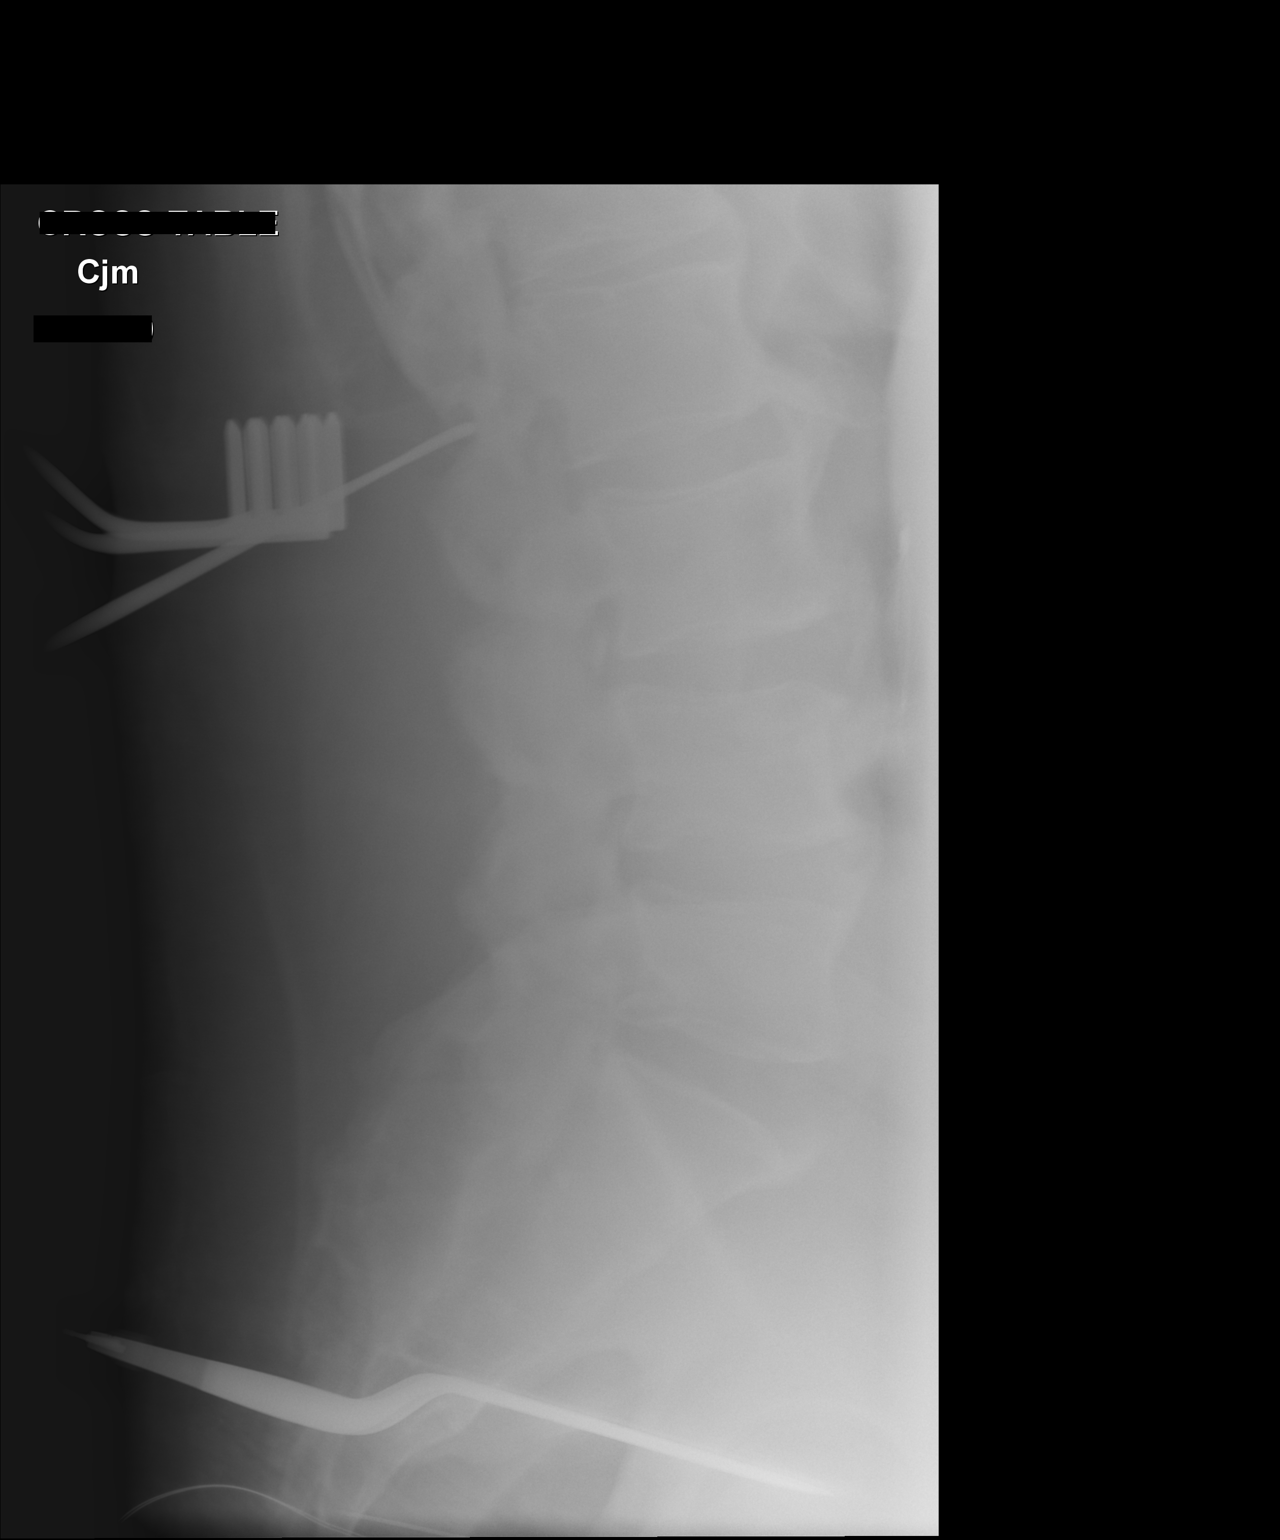

[AP (2 of 3)]
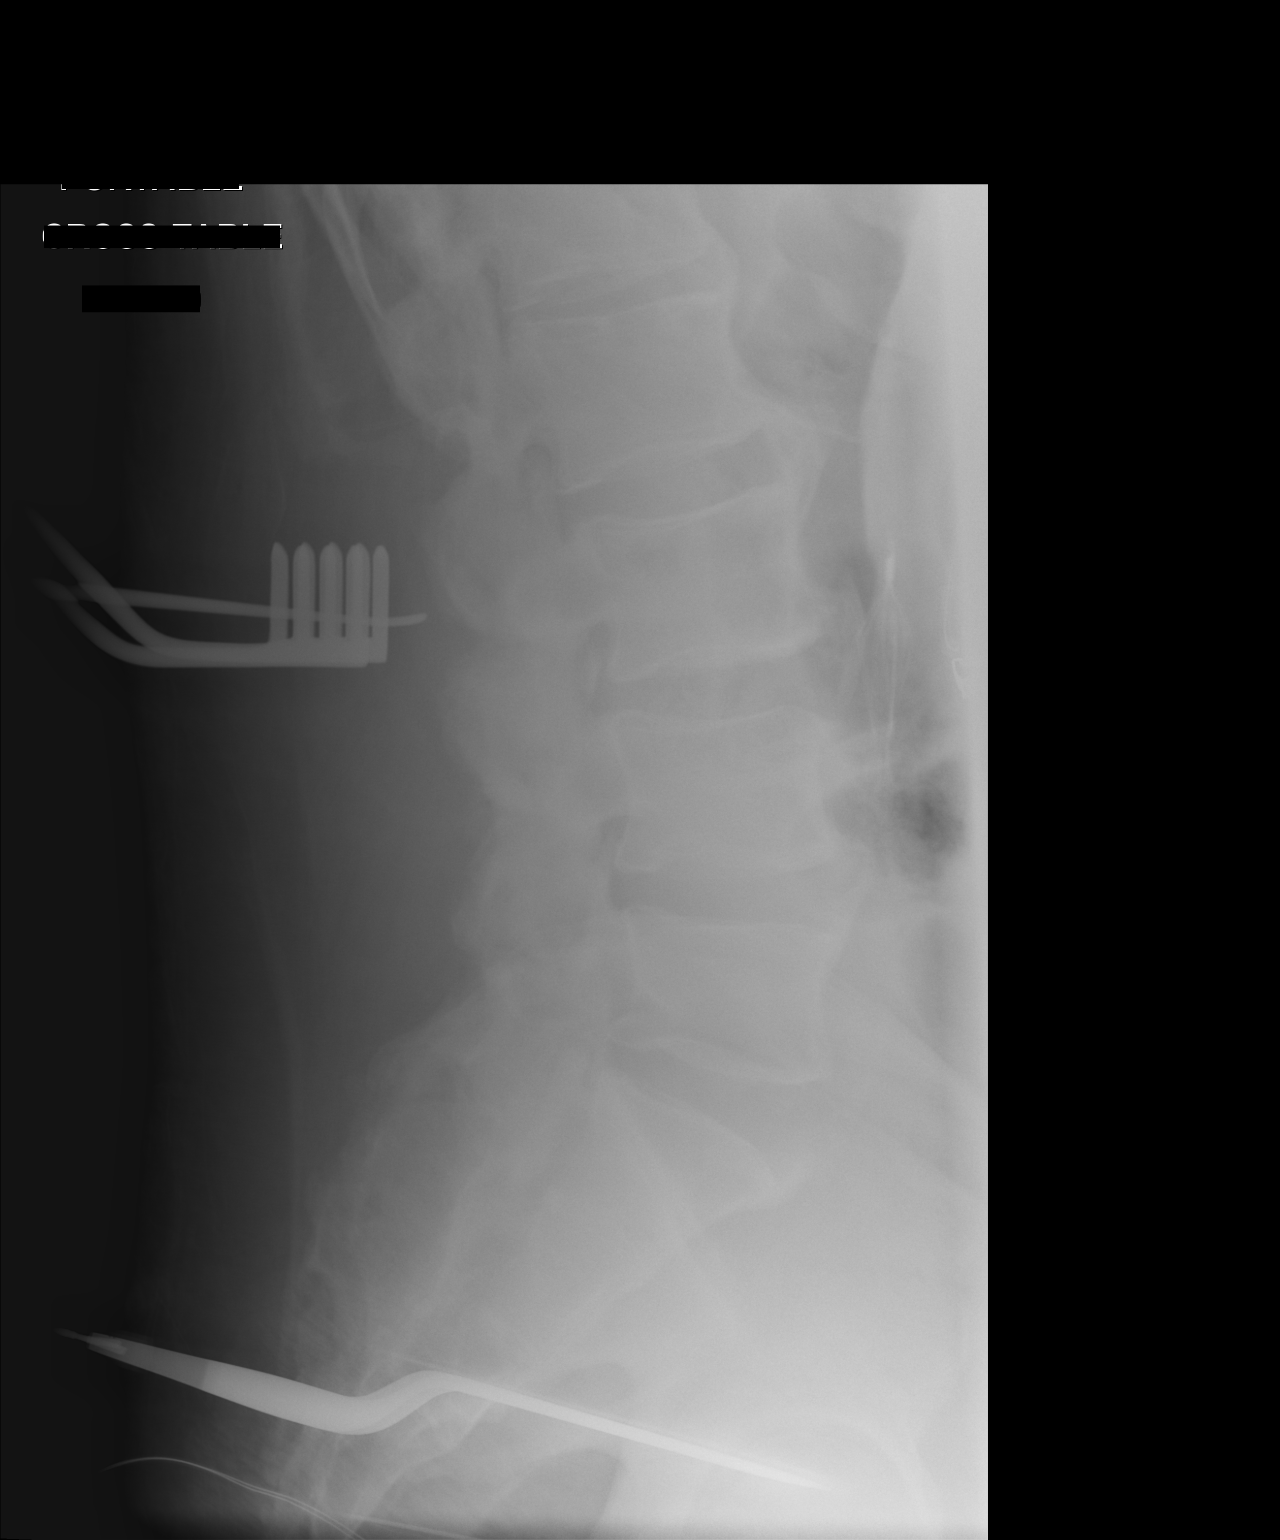

[AP (3 of 3)]
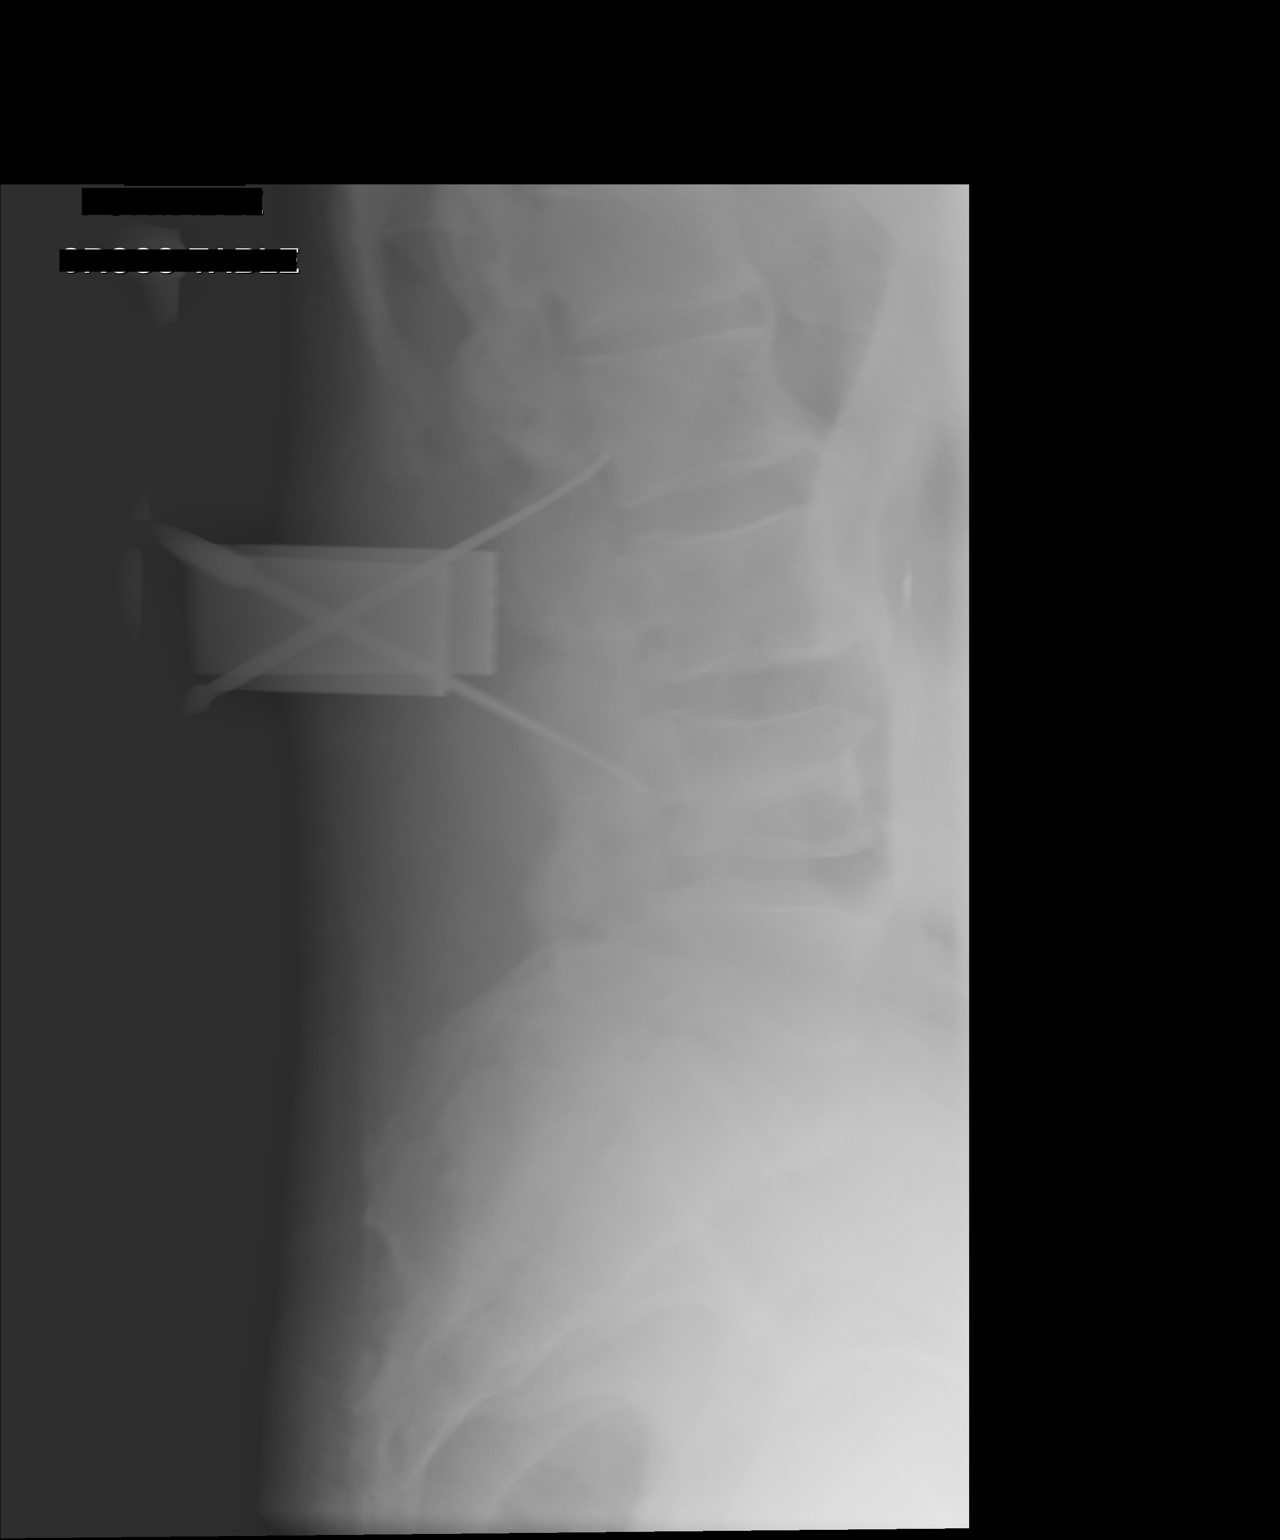

[3 of 3 positions shown; findings below may reference images not displayed]

FINDINGS: First lateral intraoperative image demonstrates posterior surgical
instrument directed at the L2 vertebral body.

Second intraoperative image demonstrates posterior surgical
instrument directed at the L3 vertebral body.

Third lateral intraoperative image demonstrates posterior surgical
instruments directed at the L2 and L4 vertebral bodies.
IMPRESSION: Localization as above.

## 2020-05-23 ENCOUNTER — Other Ambulatory Visit: Payer: Self-pay

## 2020-05-23 ENCOUNTER — Ambulatory Visit (INDEPENDENT_AMBULATORY_CARE_PROVIDER_SITE_OTHER): Payer: Medicare HMO | Admitting: *Deleted

## 2020-05-23 DIAGNOSIS — Z7901 Long term (current) use of anticoagulants: Secondary | ICD-10-CM | POA: Diagnosis not present

## 2020-05-23 DIAGNOSIS — R600 Localized edema: Secondary | ICD-10-CM | POA: Diagnosis not present

## 2020-05-23 DIAGNOSIS — Z86711 Personal history of pulmonary embolism: Secondary | ICD-10-CM | POA: Diagnosis not present

## 2020-05-23 LAB — POCT INR: INR: 2.3 (ref 2.0–3.0)

## 2020-05-23 NOTE — Patient Instructions (Signed)
Description   Continue taking 1/2 tablet daily except for 1 tablet on Tuesdays, Thursdays and Saturdays.  Recheck INR in 4 weeks. Call us with any changes # [phone].

## 2020-05-24 DIAGNOSIS — R2681 Unsteadiness on feet: Secondary | ICD-10-CM | POA: Diagnosis not present

## 2020-05-24 DIAGNOSIS — M4710 Other spondylosis with myelopathy, site unspecified: Secondary | ICD-10-CM | POA: Diagnosis not present

## 2020-05-24 DIAGNOSIS — R262 Difficulty in walking, not elsewhere classified: Secondary | ICD-10-CM | POA: Diagnosis not present

## 2020-05-24 DIAGNOSIS — M6281 Muscle weakness (generalized): Secondary | ICD-10-CM | POA: Diagnosis not present

## 2020-05-24 DIAGNOSIS — M4800 Spinal stenosis, site unspecified: Secondary | ICD-10-CM | POA: Diagnosis not present

## 2020-05-26 DIAGNOSIS — M4710 Other spondylosis with myelopathy, site unspecified: Secondary | ICD-10-CM | POA: Diagnosis not present

## 2020-05-26 DIAGNOSIS — M6281 Muscle weakness (generalized): Secondary | ICD-10-CM | POA: Diagnosis not present

## 2020-05-26 DIAGNOSIS — R262 Difficulty in walking, not elsewhere classified: Secondary | ICD-10-CM | POA: Diagnosis not present

## 2020-05-26 DIAGNOSIS — R2681 Unsteadiness on feet: Secondary | ICD-10-CM | POA: Diagnosis not present

## 2020-05-26 DIAGNOSIS — M4800 Spinal stenosis, site unspecified: Secondary | ICD-10-CM | POA: Diagnosis not present

## 2020-06-07 DIAGNOSIS — M4710 Other spondylosis with myelopathy, site unspecified: Secondary | ICD-10-CM | POA: Diagnosis not present

## 2020-06-07 DIAGNOSIS — R262 Difficulty in walking, not elsewhere classified: Secondary | ICD-10-CM | POA: Diagnosis not present

## 2020-06-07 DIAGNOSIS — R2681 Unsteadiness on feet: Secondary | ICD-10-CM | POA: Diagnosis not present

## 2020-06-07 DIAGNOSIS — M4800 Spinal stenosis, site unspecified: Secondary | ICD-10-CM | POA: Diagnosis not present

## 2020-06-07 DIAGNOSIS — M6281 Muscle weakness (generalized): Secondary | ICD-10-CM | POA: Diagnosis not present

## 2020-06-10 DIAGNOSIS — R262 Difficulty in walking, not elsewhere classified: Secondary | ICD-10-CM | POA: Diagnosis not present

## 2020-06-10 DIAGNOSIS — M6281 Muscle weakness (generalized): Secondary | ICD-10-CM | POA: Diagnosis not present

## 2020-06-10 DIAGNOSIS — R2681 Unsteadiness on feet: Secondary | ICD-10-CM | POA: Diagnosis not present

## 2020-06-10 DIAGNOSIS — M4710 Other spondylosis with myelopathy, site unspecified: Secondary | ICD-10-CM | POA: Diagnosis not present

## 2020-06-10 DIAGNOSIS — M4800 Spinal stenosis, site unspecified: Secondary | ICD-10-CM | POA: Diagnosis not present

## 2020-06-15 DIAGNOSIS — M4710 Other spondylosis with myelopathy, site unspecified: Secondary | ICD-10-CM | POA: Diagnosis not present

## 2020-06-15 DIAGNOSIS — R262 Difficulty in walking, not elsewhere classified: Secondary | ICD-10-CM | POA: Diagnosis not present

## 2020-06-15 DIAGNOSIS — R2681 Unsteadiness on feet: Secondary | ICD-10-CM | POA: Diagnosis not present

## 2020-06-15 DIAGNOSIS — M4800 Spinal stenosis, site unspecified: Secondary | ICD-10-CM | POA: Diagnosis not present

## 2020-06-15 DIAGNOSIS — M6281 Muscle weakness (generalized): Secondary | ICD-10-CM | POA: Diagnosis not present

## 2020-06-17 DIAGNOSIS — M4710 Other spondylosis with myelopathy, site unspecified: Secondary | ICD-10-CM | POA: Diagnosis not present

## 2020-06-17 DIAGNOSIS — M4800 Spinal stenosis, site unspecified: Secondary | ICD-10-CM | POA: Diagnosis not present

## 2020-06-17 DIAGNOSIS — R2681 Unsteadiness on feet: Secondary | ICD-10-CM | POA: Diagnosis not present

## 2020-06-17 DIAGNOSIS — M6281 Muscle weakness (generalized): Secondary | ICD-10-CM | POA: Diagnosis not present

## 2020-06-17 DIAGNOSIS — R262 Difficulty in walking, not elsewhere classified: Secondary | ICD-10-CM | POA: Diagnosis not present

## 2020-06-20 ENCOUNTER — Other Ambulatory Visit: Payer: Self-pay

## 2020-06-20 ENCOUNTER — Ambulatory Visit (INDEPENDENT_AMBULATORY_CARE_PROVIDER_SITE_OTHER): Payer: Medicare HMO | Admitting: *Deleted

## 2020-06-20 DIAGNOSIS — Z86711 Personal history of pulmonary embolism: Secondary | ICD-10-CM

## 2020-06-20 DIAGNOSIS — Z7901 Long term (current) use of anticoagulants: Secondary | ICD-10-CM | POA: Diagnosis not present

## 2020-06-20 DIAGNOSIS — Z5181 Encounter for therapeutic drug level monitoring: Secondary | ICD-10-CM | POA: Diagnosis not present

## 2020-06-20 LAB — POCT INR: INR: 2.4 (ref 2.0–3.0)

## 2020-06-20 NOTE — Patient Instructions (Signed)
Description   Continue taking 1/2 tablet daily except for 1 tablet on Tuesdays, Thursdays and Saturdays.  Recheck INR in 5 weeks. Call us with any changes # [phone].

## 2020-06-21 DIAGNOSIS — M4800 Spinal stenosis, site unspecified: Secondary | ICD-10-CM | POA: Diagnosis not present

## 2020-06-21 DIAGNOSIS — R262 Difficulty in walking, not elsewhere classified: Secondary | ICD-10-CM | POA: Diagnosis not present

## 2020-06-21 DIAGNOSIS — R2681 Unsteadiness on feet: Secondary | ICD-10-CM | POA: Diagnosis not present

## 2020-06-21 DIAGNOSIS — M4710 Other spondylosis with myelopathy, site unspecified: Secondary | ICD-10-CM | POA: Diagnosis not present

## 2020-06-21 DIAGNOSIS — M6281 Muscle weakness (generalized): Secondary | ICD-10-CM | POA: Diagnosis not present

## 2020-06-24 DIAGNOSIS — M4710 Other spondylosis with myelopathy, site unspecified: Secondary | ICD-10-CM | POA: Diagnosis not present

## 2020-06-24 DIAGNOSIS — M4800 Spinal stenosis, site unspecified: Secondary | ICD-10-CM | POA: Diagnosis not present

## 2020-06-24 DIAGNOSIS — R262 Difficulty in walking, not elsewhere classified: Secondary | ICD-10-CM | POA: Diagnosis not present

## 2020-06-24 DIAGNOSIS — M6281 Muscle weakness (generalized): Secondary | ICD-10-CM | POA: Diagnosis not present

## 2020-06-24 DIAGNOSIS — R2681 Unsteadiness on feet: Secondary | ICD-10-CM | POA: Diagnosis not present

## 2020-06-28 DIAGNOSIS — R2681 Unsteadiness on feet: Secondary | ICD-10-CM | POA: Diagnosis not present

## 2020-06-28 DIAGNOSIS — R262 Difficulty in walking, not elsewhere classified: Secondary | ICD-10-CM | POA: Diagnosis not present

## 2020-06-28 DIAGNOSIS — M4800 Spinal stenosis, site unspecified: Secondary | ICD-10-CM | POA: Diagnosis not present

## 2020-06-28 DIAGNOSIS — M6281 Muscle weakness (generalized): Secondary | ICD-10-CM | POA: Diagnosis not present

## 2020-06-28 DIAGNOSIS — M4710 Other spondylosis with myelopathy, site unspecified: Secondary | ICD-10-CM | POA: Diagnosis not present

## 2020-07-01 DIAGNOSIS — M6281 Muscle weakness (generalized): Secondary | ICD-10-CM | POA: Diagnosis not present

## 2020-07-01 DIAGNOSIS — R2681 Unsteadiness on feet: Secondary | ICD-10-CM | POA: Diagnosis not present

## 2020-07-01 DIAGNOSIS — R262 Difficulty in walking, not elsewhere classified: Secondary | ICD-10-CM | POA: Diagnosis not present

## 2020-07-01 DIAGNOSIS — M4710 Other spondylosis with myelopathy, site unspecified: Secondary | ICD-10-CM | POA: Diagnosis not present

## 2020-07-01 DIAGNOSIS — M4800 Spinal stenosis, site unspecified: Secondary | ICD-10-CM | POA: Diagnosis not present

## 2020-07-05 DIAGNOSIS — R262 Difficulty in walking, not elsewhere classified: Secondary | ICD-10-CM | POA: Diagnosis not present

## 2020-07-05 DIAGNOSIS — M4800 Spinal stenosis, site unspecified: Secondary | ICD-10-CM | POA: Diagnosis not present

## 2020-07-05 DIAGNOSIS — M6281 Muscle weakness (generalized): Secondary | ICD-10-CM | POA: Diagnosis not present

## 2020-07-05 DIAGNOSIS — M4710 Other spondylosis with myelopathy, site unspecified: Secondary | ICD-10-CM | POA: Diagnosis not present

## 2020-07-05 DIAGNOSIS — R2681 Unsteadiness on feet: Secondary | ICD-10-CM | POA: Diagnosis not present

## 2020-07-08 DIAGNOSIS — M6281 Muscle weakness (generalized): Secondary | ICD-10-CM | POA: Diagnosis not present

## 2020-07-08 DIAGNOSIS — M4710 Other spondylosis with myelopathy, site unspecified: Secondary | ICD-10-CM | POA: Diagnosis not present

## 2020-07-08 DIAGNOSIS — R262 Difficulty in walking, not elsewhere classified: Secondary | ICD-10-CM | POA: Diagnosis not present

## 2020-07-08 DIAGNOSIS — M4800 Spinal stenosis, site unspecified: Secondary | ICD-10-CM | POA: Diagnosis not present

## 2020-07-08 DIAGNOSIS — R2681 Unsteadiness on feet: Secondary | ICD-10-CM | POA: Diagnosis not present

## 2020-07-12 DIAGNOSIS — M6281 Muscle weakness (generalized): Secondary | ICD-10-CM | POA: Diagnosis not present

## 2020-07-12 DIAGNOSIS — R2681 Unsteadiness on feet: Secondary | ICD-10-CM | POA: Diagnosis not present

## 2020-07-12 DIAGNOSIS — M4800 Spinal stenosis, site unspecified: Secondary | ICD-10-CM | POA: Diagnosis not present

## 2020-07-12 DIAGNOSIS — R262 Difficulty in walking, not elsewhere classified: Secondary | ICD-10-CM | POA: Diagnosis not present

## 2020-07-12 DIAGNOSIS — M4710 Other spondylosis with myelopathy, site unspecified: Secondary | ICD-10-CM | POA: Diagnosis not present

## 2020-07-15 DIAGNOSIS — M4800 Spinal stenosis, site unspecified: Secondary | ICD-10-CM | POA: Diagnosis not present

## 2020-07-15 DIAGNOSIS — M4714 Other spondylosis with myelopathy, thoracic region: Secondary | ICD-10-CM | POA: Diagnosis not present

## 2020-07-15 DIAGNOSIS — M4710 Other spondylosis with myelopathy, site unspecified: Secondary | ICD-10-CM | POA: Diagnosis not present

## 2020-07-15 DIAGNOSIS — M6281 Muscle weakness (generalized): Secondary | ICD-10-CM | POA: Diagnosis not present

## 2020-07-15 DIAGNOSIS — R262 Difficulty in walking, not elsewhere classified: Secondary | ICD-10-CM | POA: Diagnosis not present

## 2020-07-15 DIAGNOSIS — R2681 Unsteadiness on feet: Secondary | ICD-10-CM | POA: Diagnosis not present

## 2020-07-19 DIAGNOSIS — M6281 Muscle weakness (generalized): Secondary | ICD-10-CM | POA: Diagnosis not present

## 2020-07-19 DIAGNOSIS — M4710 Other spondylosis with myelopathy, site unspecified: Secondary | ICD-10-CM | POA: Diagnosis not present

## 2020-07-19 DIAGNOSIS — R262 Difficulty in walking, not elsewhere classified: Secondary | ICD-10-CM | POA: Diagnosis not present

## 2020-07-19 DIAGNOSIS — R2681 Unsteadiness on feet: Secondary | ICD-10-CM | POA: Diagnosis not present

## 2020-07-19 DIAGNOSIS — M4800 Spinal stenosis, site unspecified: Secondary | ICD-10-CM | POA: Diagnosis not present

## 2020-07-25 ENCOUNTER — Other Ambulatory Visit: Payer: Self-pay

## 2020-07-25 ENCOUNTER — Ambulatory Visit (INDEPENDENT_AMBULATORY_CARE_PROVIDER_SITE_OTHER): Payer: Medicare HMO | Admitting: *Deleted

## 2020-07-25 DIAGNOSIS — R600 Localized edema: Secondary | ICD-10-CM | POA: Diagnosis not present

## 2020-07-25 DIAGNOSIS — Z86711 Personal history of pulmonary embolism: Secondary | ICD-10-CM | POA: Diagnosis not present

## 2020-07-25 DIAGNOSIS — Z7901 Long term (current) use of anticoagulants: Secondary | ICD-10-CM

## 2020-07-25 LAB — POCT INR: INR: 3.1 — AB (ref 2.0–3.0)

## 2020-07-25 NOTE — Patient Instructions (Signed)
Description   Tomorrow take 1/2 tablet then continue taking 1/2 tablet daily except for 1 tablet on Tuesdays, Thursdays and Saturdays.  Recheck INR in 5 weeks. Call us with any changes # [phone].

## 2020-07-26 DIAGNOSIS — R2681 Unsteadiness on feet: Secondary | ICD-10-CM | POA: Diagnosis not present

## 2020-07-26 DIAGNOSIS — R262 Difficulty in walking, not elsewhere classified: Secondary | ICD-10-CM | POA: Diagnosis not present

## 2020-07-26 DIAGNOSIS — M4800 Spinal stenosis, site unspecified: Secondary | ICD-10-CM | POA: Diagnosis not present

## 2020-07-26 DIAGNOSIS — M6281 Muscle weakness (generalized): Secondary | ICD-10-CM | POA: Diagnosis not present

## 2020-07-26 DIAGNOSIS — M4710 Other spondylosis with myelopathy, site unspecified: Secondary | ICD-10-CM | POA: Diagnosis not present

## 2020-07-28 ENCOUNTER — Encounter (INDEPENDENT_AMBULATORY_CARE_PROVIDER_SITE_OTHER): Payer: Medicare HMO | Admitting: Ophthalmology

## 2020-07-28 DIAGNOSIS — R262 Difficulty in walking, not elsewhere classified: Secondary | ICD-10-CM | POA: Diagnosis not present

## 2020-07-28 DIAGNOSIS — M6281 Muscle weakness (generalized): Secondary | ICD-10-CM | POA: Diagnosis not present

## 2020-07-28 DIAGNOSIS — M4800 Spinal stenosis, site unspecified: Secondary | ICD-10-CM | POA: Diagnosis not present

## 2020-07-28 DIAGNOSIS — R2681 Unsteadiness on feet: Secondary | ICD-10-CM | POA: Diagnosis not present

## 2020-07-28 DIAGNOSIS — M4710 Other spondylosis with myelopathy, site unspecified: Secondary | ICD-10-CM | POA: Diagnosis not present

## 2020-07-29 ENCOUNTER — Encounter (INDEPENDENT_AMBULATORY_CARE_PROVIDER_SITE_OTHER): Payer: Medicare HMO | Admitting: Ophthalmology

## 2020-08-02 DIAGNOSIS — M6281 Muscle weakness (generalized): Secondary | ICD-10-CM | POA: Diagnosis not present

## 2020-08-02 DIAGNOSIS — R262 Difficulty in walking, not elsewhere classified: Secondary | ICD-10-CM | POA: Diagnosis not present

## 2020-08-02 DIAGNOSIS — R2681 Unsteadiness on feet: Secondary | ICD-10-CM | POA: Diagnosis not present

## 2020-08-02 DIAGNOSIS — M4800 Spinal stenosis, site unspecified: Secondary | ICD-10-CM | POA: Diagnosis not present

## 2020-08-02 DIAGNOSIS — M4710 Other spondylosis with myelopathy, site unspecified: Secondary | ICD-10-CM | POA: Diagnosis not present

## 2020-08-04 NOTE — Progress Notes (Signed)
[facility] Note  08/08/2020     CHIEF COMPLAINT Patient presents for Retina Follow Up   HISTORY OF PRESENT ILLNESS: Nicolas [name] is a 54 y.o. male who presents to the clinic today for:   HPI    Retina Follow Up    Patient presents with  Other.  In right eye.  This started months ago.  Severity is mild.  Duration of 3 months.  Since onset it is stable.  I, the attending physician,  performed the HPI with the patient and updated documentation appropriately.          Comments    54 y/o male pt here for 3 mo f/u.  S/p laser retinopexy OD for lattice.  No change in [place] OU.  Denies pain, FOL, floaters.  No gtts.       Last edited by Bernarda Caffey, MD on 08/08/2020 12:40 PM. (History)    pt states no change in vision, no new fol or floaters  Referring physician: [name], [place], [address] [place],  [place] [address]  HISTORICAL INFORMATION:   Selected notes from the MEDICAL RECORD NUMBER Referred by Dr. [place] [name] for concern of retinal holes OU   CURRENT MEDICATIONS: No current outpatient medications on file. (Ophthalmic Drugs)   No current facility-administered medications for this visit. (Ophthalmic Drugs)   Current Outpatient Medications (Other)  Medication Sig  . baclofen (LIORESAL) 10 MG tablet TAKE 1/2 TABLET(5 MG) BY MOUTH AT BEDTIME  . diazepam (VALIUM) 5 MG tablet   . enoxaparin (LOVENOX) 150 MG/ML injection Inject into the skin.  . furosemide (LASIX) 40 MG tablet TAKE 1 TABLET EVERY DAY, NEED MD APPOINTMENT WITH CARDS FOR REFILLS (Patient taking differently: Take 40 mg by mouth daily. )  . gabapentin (NEURONTIN) 300 MG capsule Take 1 capsule (300 mg total) by mouth 3 (three) times daily.  Marland Kitchen levothyroxine (SYNTHROID) 25 MCG tablet Take 1 tablet (25 mcg total) by mouth daily before breakfast.  . metoprolol succinate (TOPROL-XL) 50 MG 24 hr tablet Take 1.5 tablets (75 mg total) by mouth once a day (Patient taking  differently: Take 75 mg by mouth daily after breakfast. Take 1.5 tablets (75 mg total) by mouth once a day)  . Multiple Vitamin (MULTIVITAMIN WITH MINERALS) TABS tablet Take 1 tablet by mouth daily.   Marland Kitchen oxyCODONE-acetaminophen (PERCOCET) 10-325 MG tablet Take 0.5-1 tablets by mouth every 8 (eight) hours as needed.  . potassium chloride SA (K-DUR) 20 MEQ tablet TAKE 1 TABLET EVERY DAY, NEED MD APPOINTMENT WITH CARDS FOR REFILLS (Patient taking differently: Take 20 mEq by mouth daily. )  . warfarin (COUMADIN) 10 MG tablet TAKE 1 TABLET DAILY EXCEPT 1/2 TABLET ON Monday, Wednesday, Friday OR AS DIRECTED BY COUMADIN CLINIC.  Marland Kitchen cyclobenzaprine (FLEXERIL) 10 MG tablet Take 1 tablet (10 mg total) by mouth 3 (three) times daily as needed for muscle spasms.  Marland Kitchen losartan (COZAAR) 50 MG tablet Take 1 tablet (50 mg total) by mouth daily.  Marland Kitchen oxyCODONE 10 MG TABS Take 0.5-1 tablets (5-10 mg total) by mouth every 4 (four) hours as needed for severe pain ((score 7 to 10)).   No current facility-administered medications for this visit. (Other)      REVIEW OF SYSTEMS: ROS    Positive for: Musculoskeletal, Endocrine, Eyes   Negative for: Constitutional, Gastrointestinal, Neurological, Skin, Genitourinary, HENT, Cardiovascular, Respiratory, Psychiatric, Allergic/Imm, Heme/Lymph   Last edited by Matthew Folks, COA on 08/08/2020 10:01 AM. (History)  ALLERGIES Allergies  Allergen Reactions  . Ace Inhibitors Cough  . Lisinopril Cough    PAST MEDICAL HISTORY Past Medical History:  Diagnosis Date  . Arthritis   . Cataract    Combined form OU  . Clotting disorder ([place])    pulmonary embolus, DVT  . Depression   . Diabetes mellitus without complication (HCC)    no meds, watching diet  . DVT (deep venous thrombosis) ([place])    Summer 2012  . Hypertension   . Hypertensive retinopathy    OU  . Neuromuscular disorder ([place])    spinal stenosis.- pt walks with a walker short distance  . Peripheral  vascular disease ([place])   . PONV (postoperative nausea and vomiting)   . Pulmonary embolism ([place])    x 3 in 2005, 2008, 2010  . Sleep apnea    does not wear a c-pap, pt. reports that he gave the CPAP back, denies that he has OSA   . Spinal stenosis    Past Surgical History:  Procedure Laterality Date  . ANTERIOR CERVICAL CORPECTOMY N/A 01/26/2019   Procedure: Cervical Four Corpectomy with Cervical Three-Four, Cervical Four-Five interbody fusion, prosthesis, explore old fusion, possible removal of old plate;  Surgeon: [name], MD;  Location: [facility];  Service: Neurosurgery;  Laterality: N/A;  anterior approach  . CHOLECYSTECTOMY N/A 05/24/2014   Procedure: LAPAROSCOPIC CHOLECYSTECTOMY;  Surgeon: Zenovia Jarred, MD;  Location: [facility];  Service: General;  Laterality: N/A;  . ERCP N/A 05/25/2014   Procedure: ENDOSCOPIC RETROGRADE CHOLANGIOPANCREATOGRAPHY (ERCP);  Surgeon: Beryle Beams, MD;  Location: [facility];  Service: Endoscopy;  Laterality: N/A;  . ERCP N/A 07/16/2014   Procedure: ENDOSCOPIC RETROGRADE CHOLANGIOPANCREATOGRAPHY (ERCP);  Surgeon: Beryle Beams, MD;  Location: [facility];  Service: Endoscopy;  Laterality: N/A;  . ivp filter  jan 2012  . LUMBAR LAMINECTOMY/DECOMPRESSION MICRODISCECTOMY N/A 05/14/2019   Procedure: LAMINECTOMY AND FORAMINOTOMY THORACIC FIVE- THORACIC SIX, THORACIC ELEVEN- THORACIC THORACIC, LUMBAR THREE- LUMBAR FOUR;  Surgeon: [name], MD;  Location: [facility];  Service: Neurosurgery;  Laterality: N/A;  posterior  . LUMBAR LAMINECTOMY/DECOMPRESSION MICRODISCECTOMY N/A 11/11/2019   Procedure: REDO LUMBAR THREE- LUMBAR FOUR LAMINECTOMY/FORAMINOTOMY;  Surgeon: [name], MD;  Location: [facility];  Service: Neurosurgery;  Laterality: N/A;  . SPINE SURGERY  11/30/11   Total 6 back surgeries  . SPINE SURGERY  17/0017   Dr. Cathren Laine in [place], [place]  . [place]     08/2003, cervial spine 2005, 2010 lower back, 2011 lower back, 2012 & 2013  . UPPER  GASTROINTESTINAL ENDOSCOPY    . WISDOM TOOTH EXTRACTION     2 removed    FAMILY HISTORY Family History  Problem Relation Age of Onset  . Cancer Mother 79       Lung  . Diabetes Father   . Prostate cancer Father   . Hypertension Father   . Hypertension Brother   . Hypertension Brother   . Diabetes Brother   . Heart attack Neg Hx   . Stroke Neg Hx   . Colon cancer Neg Hx   . Esophageal cancer Neg Hx   . Pancreatic cancer Neg Hx   . Rectal cancer Neg Hx   . Stomach cancer Neg Hx     SOCIAL HISTORY Social History   Tobacco Use  . Smoking status: Former Smoker    Packs/day: 1.50    Years: 10.00    Pack years: 15.00    Types: Cigarettes    Quit date:  04/26/2001    Years since quitting: 19.2  . Smokeless tobacco: Never Used  Vaping Use  . Vaping Use: Never used  Substance Use Topics  . Alcohol use: Yes    Alcohol/week: 1.0 standard drink    Types: 1 Standard drinks or equivalent per week    Comment: very little- rare  . Drug use: Yes    Types: Marijuana    Comment: daily- twice per day          OPHTHALMIC EXAM:  Base Eye Exam    Visual Acuity (Snellen - Linear)      Right Left   Dist [place] 20/20 20/20 -2       Tonometry (Tonopen, 10:04 AM)      Right Left   Pressure 22 23       Pupils      Dark Light Shape React APD   Right 5 4 Round Brisk None   Left 5 4 Round Brisk None       Visual Fields (Counting fingers)      Left Right    Full Full       Extraocular Movement      Right Left    Full, Ortho Full, Ortho       Neuro/Psych    Oriented x3: Yes   Mood/Affect: Normal       Dilation    Both eyes: 1.0% Mydriacyl, 2.5% Phenylephrine @ 10:04 AM        Slit Lamp and Fundus Exam    Slit Lamp Exam      Right Left   Lids/Lashes Dermatochalasis - upper lid Dermatochalasis - upper lid, Meibomian gland dysfunction   Conjunctiva/Sclera mild Melanosis mild Melanosis   Cornea Clear Clear   Anterior Chamber Deep and quiet Deep and quiet   Iris  Round and dilated, No NVI Round and dilated, No NVI   Lens 1-2+ Nuclear sclerosis, 2+ Cortical cataract 1-2+ Nuclear sclerosis, 2+ Cortical cataract   Vitreous Vitreous syneresis Vitreous syneresis       Fundus Exam      Right Left   Disc Pink and Sharp Pink and Sharp   C/D Ratio 0.3 0.4   Macula Flat, Good foveal reflex, Retinal pigment epithelial mottling, No heme or edema Flat, Good foveal reflex, Retinal pigment epithelial mottling, No heme or edema   Vessels Mild Vascular attenuation, mild tortuousity Mild Vascular attenuation   Periphery Attached, mild patch of lattice with pigment and atrophic hole from 0700-0730, round hole at 0730--good laser surrounding, No new RT/RD Attached, WWP temporally, No RT/RD          IMAGING AND PROCEDURES  Imaging and Procedures for @TODAY @  OCT, Retina - OU - Both Eyes       Right Eye Quality was good. Central Foveal Thickness: 273. Progression has been stable. Findings include normal foveal contour, no IRF, no SRF, vitreomacular adhesion .   Left Eye Central Foveal Thickness: 268. Progression has been stable. Findings include normal foveal contour, no SRF, no IRF, vitreomacular adhesion .   Notes *Images captured and stored on drive  Diagnosis / Impression:  NFP, no IRF/SRF OU +VMA OU  Clinical management:  See below  Abbreviations: NFP - Normal foveal profile. CME - cystoid macular edema. PED - pigment epithelial detachment. IRF - intraretinal fluid. SRF - subretinal fluid. EZ - ellipsoid zone. ERM - epiretinal membrane. ORA - outer retinal atrophy. ORT - outer retinal tubulation. SRHM - subretinal hyper-reflective material  ASSESSMENT/PLAN:    ICD-10-CM   1. Lattice degeneration of right retina  H35.411   2. Retinal hole of right eye  H33.321   3. Retinal edema  H35.81 OCT, Retina - OU - Both Eyes  4. Essential hypertension  I10   5. Hypertensive retinopathy of both eyes  H35.033   6. Combined forms of  age-related cataract of both eyes  H25.813   7. Bilateral ocular hypertension  H40.053     1-3. Lattice degeneration w/ atrophic holes, right eye  - lattice with pigment and atrophic hole from 0700-0730, round hole at 0730  - laser retinopexy OD 05.05.21 -- good laser in place  - no new lesions or RT/RD  - pt is cleared from a retina standpoint for release to Dr. Parke Simmers and resumption of primary eye care  4,5. Hypertensive retinopathy OU  - discussed importance of tight BP control  - monitor  6. Mixed cataracts OU  - The symptoms of cataract, surgical options, and treatments and risks were discussed with patient.  - discussed diagnosis and progression  - not yet visually significant  - monitor for now  7. Ocular hypertension / glc suspect  - IOP 22,23  - under the expert management of Dr. Parke Simmers   Ophthalmic Meds Ordered this visit:  No orders of the defined types were placed in this encounter.      Return if symptoms worsen or fail to improve.  There are no Patient Instructions on file for this visit.  This document serves as a record of services personally performed by Gardiner Sleeper, MD, PhD. It was created on their behalf by Leeann Must, [place], an ophthalmic technician. The creation of this record is the provider's dictation and/or activities during the visit.    Electronically signed by: Leeann Must, [place] 09.09.2021 12:47 PM   This document serves as a record of services personally performed by Gardiner Sleeper, MD, PhD. It was created on their behalf by San Jetty. Owens Shark, OA an ophthalmic technician. The creation of this record is the provider's dictation and/or activities during the visit.    Electronically signed by: San Jetty. Marguerita Merles 09.13.2021 12:47 PM  Gardiner Sleeper, M.D., Ph.D. Diseases & Surgery of the Retina and [place] 08/08/2020   I have reviewed the above documentation for accuracy and completeness, and I  agree with the above. Gardiner Sleeper, M.D., Ph.D. 08/08/20 12:47 PM    Abbreviations: M myopia (nearsighted); A astigmatism; H hyperopia (farsighted); P presbyopia; Mrx spectacle prescription;  CTL contact lenses; OD right eye; OS left eye; OU both eyes  XT exotropia; ET esotropia; PEK punctate epithelial keratitis; PEE punctate epithelial erosions; DES dry eye syndrome; MGD meibomian gland dysfunction; ATs artificial tears; PFAT's preservative free artificial tears; [place] nuclear sclerotic cataract; PSC posterior subcapsular cataract; ERM epi-retinal membrane; PVD posterior vitreous detachment; RD retinal detachment; DM diabetes mellitus; DR diabetic retinopathy; NPDR non-proliferative diabetic retinopathy; PDR proliferative diabetic retinopathy; CSME clinically significant macular edema; DME diabetic macular edema; dbh dot blot hemorrhages; CWS cotton wool spot; POAG primary open angle glaucoma; C/D cup-to-disc ratio; HVF humphrey visual field; GVF goldmann visual field; OCT optical coherence tomography; IOP intraocular pressure; BRVO Branch retinal vein occlusion; CRVO central retinal vein occlusion; CRAO central retinal artery occlusion; BRAO branch retinal artery occlusion; RT retinal tear; SB scleral buckle; PPV pars plana vitrectomy; VH Vitreous hemorrhage; PRP panretinal laser photocoagulation; IVK intravitreal kenalog; VMT vitreomacular traction; MH Macular hole;  NVD  neovascularization of the disc; NVE neovascularization elsewhere; AREDS age related eye disease study; ARMD age related macular degeneration; POAG primary open angle glaucoma; EBMD epithelial/anterior basement membrane dystrophy; ACIOL anterior chamber intraocular lens; IOL intraocular lens; PCIOL posterior chamber intraocular lens; Phaco/IOL phacoemulsification with intraocular lens placement; Lupus photorefractive keratectomy; LASIK laser assisted in situ keratomileusis; HTN hypertension; DM diabetes mellitus; COPD chronic obstructive  pulmonary disease

## 2020-08-05 DIAGNOSIS — M6281 Muscle weakness (generalized): Secondary | ICD-10-CM | POA: Diagnosis not present

## 2020-08-05 DIAGNOSIS — R2681 Unsteadiness on feet: Secondary | ICD-10-CM | POA: Diagnosis not present

## 2020-08-05 DIAGNOSIS — M4710 Other spondylosis with myelopathy, site unspecified: Secondary | ICD-10-CM | POA: Diagnosis not present

## 2020-08-05 DIAGNOSIS — R262 Difficulty in walking, not elsewhere classified: Secondary | ICD-10-CM | POA: Diagnosis not present

## 2020-08-05 DIAGNOSIS — M4800 Spinal stenosis, site unspecified: Secondary | ICD-10-CM | POA: Diagnosis not present

## 2020-08-08 ENCOUNTER — Encounter (INDEPENDENT_AMBULATORY_CARE_PROVIDER_SITE_OTHER): Payer: Self-pay | Admitting: Ophthalmology

## 2020-08-08 ENCOUNTER — Ambulatory Visit (INDEPENDENT_AMBULATORY_CARE_PROVIDER_SITE_OTHER): Payer: Medicare HMO | Admitting: Ophthalmology

## 2020-08-08 ENCOUNTER — Other Ambulatory Visit: Payer: Self-pay

## 2020-08-08 DIAGNOSIS — I10 Essential (primary) hypertension: Secondary | ICD-10-CM | POA: Diagnosis not present

## 2020-08-08 DIAGNOSIS — H40053 Ocular hypertension, bilateral: Secondary | ICD-10-CM | POA: Diagnosis not present

## 2020-08-08 DIAGNOSIS — H35033 Hypertensive retinopathy, bilateral: Secondary | ICD-10-CM

## 2020-08-08 DIAGNOSIS — H25813 Combined forms of age-related cataract, bilateral: Secondary | ICD-10-CM | POA: Diagnosis not present

## 2020-08-08 DIAGNOSIS — H33321 Round hole, right eye: Secondary | ICD-10-CM

## 2020-08-08 DIAGNOSIS — H35411 Lattice degeneration of retina, right eye: Secondary | ICD-10-CM

## 2020-08-08 DIAGNOSIS — H3581 Retinal edema: Secondary | ICD-10-CM | POA: Diagnosis not present

## 2020-08-09 DIAGNOSIS — R2681 Unsteadiness on feet: Secondary | ICD-10-CM | POA: Diagnosis not present

## 2020-08-09 DIAGNOSIS — M4800 Spinal stenosis, site unspecified: Secondary | ICD-10-CM | POA: Diagnosis not present

## 2020-08-09 DIAGNOSIS — M6281 Muscle weakness (generalized): Secondary | ICD-10-CM | POA: Diagnosis not present

## 2020-08-09 DIAGNOSIS — M4710 Other spondylosis with myelopathy, site unspecified: Secondary | ICD-10-CM | POA: Diagnosis not present

## 2020-08-09 DIAGNOSIS — R262 Difficulty in walking, not elsewhere classified: Secondary | ICD-10-CM | POA: Diagnosis not present

## 2020-08-12 DIAGNOSIS — M4800 Spinal stenosis, site unspecified: Secondary | ICD-10-CM | POA: Diagnosis not present

## 2020-08-12 DIAGNOSIS — M6281 Muscle weakness (generalized): Secondary | ICD-10-CM | POA: Diagnosis not present

## 2020-08-12 DIAGNOSIS — R262 Difficulty in walking, not elsewhere classified: Secondary | ICD-10-CM | POA: Diagnosis not present

## 2020-08-12 DIAGNOSIS — R2681 Unsteadiness on feet: Secondary | ICD-10-CM | POA: Diagnosis not present

## 2020-08-12 DIAGNOSIS — M4710 Other spondylosis with myelopathy, site unspecified: Secondary | ICD-10-CM | POA: Diagnosis not present

## 2020-08-16 DIAGNOSIS — R2681 Unsteadiness on feet: Secondary | ICD-10-CM | POA: Diagnosis not present

## 2020-08-16 DIAGNOSIS — R262 Difficulty in walking, not elsewhere classified: Secondary | ICD-10-CM | POA: Diagnosis not present

## 2020-08-16 DIAGNOSIS — M4710 Other spondylosis with myelopathy, site unspecified: Secondary | ICD-10-CM | POA: Diagnosis not present

## 2020-08-16 DIAGNOSIS — M4800 Spinal stenosis, site unspecified: Secondary | ICD-10-CM | POA: Diagnosis not present

## 2020-08-16 DIAGNOSIS — M6281 Muscle weakness (generalized): Secondary | ICD-10-CM | POA: Diagnosis not present

## 2020-08-19 DIAGNOSIS — M4710 Other spondylosis with myelopathy, site unspecified: Secondary | ICD-10-CM | POA: Diagnosis not present

## 2020-08-19 DIAGNOSIS — M6281 Muscle weakness (generalized): Secondary | ICD-10-CM | POA: Diagnosis not present

## 2020-08-19 DIAGNOSIS — R2681 Unsteadiness on feet: Secondary | ICD-10-CM | POA: Diagnosis not present

## 2020-08-19 DIAGNOSIS — M4800 Spinal stenosis, site unspecified: Secondary | ICD-10-CM | POA: Diagnosis not present

## 2020-08-19 DIAGNOSIS — R262 Difficulty in walking, not elsewhere classified: Secondary | ICD-10-CM | POA: Diagnosis not present

## 2020-08-23 DIAGNOSIS — M4800 Spinal stenosis, site unspecified: Secondary | ICD-10-CM | POA: Diagnosis not present

## 2020-08-23 DIAGNOSIS — M6281 Muscle weakness (generalized): Secondary | ICD-10-CM | POA: Diagnosis not present

## 2020-08-23 DIAGNOSIS — R2681 Unsteadiness on feet: Secondary | ICD-10-CM | POA: Diagnosis not present

## 2020-08-23 DIAGNOSIS — M4710 Other spondylosis with myelopathy, site unspecified: Secondary | ICD-10-CM | POA: Diagnosis not present

## 2020-08-23 DIAGNOSIS — R262 Difficulty in walking, not elsewhere classified: Secondary | ICD-10-CM | POA: Diagnosis not present

## 2020-08-26 DIAGNOSIS — M6281 Muscle weakness (generalized): Secondary | ICD-10-CM | POA: Diagnosis not present

## 2020-08-26 DIAGNOSIS — R262 Difficulty in walking, not elsewhere classified: Secondary | ICD-10-CM | POA: Diagnosis not present

## 2020-08-26 DIAGNOSIS — R2681 Unsteadiness on feet: Secondary | ICD-10-CM | POA: Diagnosis not present

## 2020-08-26 DIAGNOSIS — M4800 Spinal stenosis, site unspecified: Secondary | ICD-10-CM | POA: Diagnosis not present

## 2020-08-26 DIAGNOSIS — M4710 Other spondylosis with myelopathy, site unspecified: Secondary | ICD-10-CM | POA: Diagnosis not present

## 2020-08-29 ENCOUNTER — Ambulatory Visit (INDEPENDENT_AMBULATORY_CARE_PROVIDER_SITE_OTHER): Payer: Medicare HMO | Admitting: *Deleted

## 2020-08-29 ENCOUNTER — Other Ambulatory Visit: Payer: Self-pay

## 2020-08-29 DIAGNOSIS — R600 Localized edema: Secondary | ICD-10-CM | POA: Diagnosis not present

## 2020-08-29 DIAGNOSIS — Z7901 Long term (current) use of anticoagulants: Secondary | ICD-10-CM | POA: Diagnosis not present

## 2020-08-29 DIAGNOSIS — Z86711 Personal history of pulmonary embolism: Secondary | ICD-10-CM | POA: Diagnosis not present

## 2020-08-29 LAB — POCT INR: INR: 2.9 (ref 2.0–3.0)

## 2020-08-29 NOTE — Patient Instructions (Signed)
Description   Continue taking the dose you been taking which is 1 tablet daily except for 1/2 tablet on Monday, Wednesday, and Friday. Recheck INR in 6 weeks. Call us with any changes # [phone].

## 2020-08-30 DIAGNOSIS — R262 Difficulty in walking, not elsewhere classified: Secondary | ICD-10-CM | POA: Diagnosis not present

## 2020-08-30 DIAGNOSIS — M6281 Muscle weakness (generalized): Secondary | ICD-10-CM | POA: Diagnosis not present

## 2020-08-30 DIAGNOSIS — R2681 Unsteadiness on feet: Secondary | ICD-10-CM | POA: Diagnosis not present

## 2020-08-30 DIAGNOSIS — M4800 Spinal stenosis, site unspecified: Secondary | ICD-10-CM | POA: Diagnosis not present

## 2020-08-30 DIAGNOSIS — M4710 Other spondylosis with myelopathy, site unspecified: Secondary | ICD-10-CM | POA: Diagnosis not present

## 2020-09-02 DIAGNOSIS — R262 Difficulty in walking, not elsewhere classified: Secondary | ICD-10-CM | POA: Diagnosis not present

## 2020-09-02 DIAGNOSIS — M6281 Muscle weakness (generalized): Secondary | ICD-10-CM | POA: Diagnosis not present

## 2020-09-02 DIAGNOSIS — M4710 Other spondylosis with myelopathy, site unspecified: Secondary | ICD-10-CM | POA: Diagnosis not present

## 2020-09-02 DIAGNOSIS — R2681 Unsteadiness on feet: Secondary | ICD-10-CM | POA: Diagnosis not present

## 2020-09-02 DIAGNOSIS — M4800 Spinal stenosis, site unspecified: Secondary | ICD-10-CM | POA: Diagnosis not present

## 2020-09-06 DIAGNOSIS — R2681 Unsteadiness on feet: Secondary | ICD-10-CM | POA: Diagnosis not present

## 2020-09-06 DIAGNOSIS — M4710 Other spondylosis with myelopathy, site unspecified: Secondary | ICD-10-CM | POA: Diagnosis not present

## 2020-09-06 DIAGNOSIS — M4800 Spinal stenosis, site unspecified: Secondary | ICD-10-CM | POA: Diagnosis not present

## 2020-09-06 DIAGNOSIS — R262 Difficulty in walking, not elsewhere classified: Secondary | ICD-10-CM | POA: Diagnosis not present

## 2020-09-06 DIAGNOSIS — M6281 Muscle weakness (generalized): Secondary | ICD-10-CM | POA: Diagnosis not present

## 2020-09-13 DIAGNOSIS — R262 Difficulty in walking, not elsewhere classified: Secondary | ICD-10-CM | POA: Diagnosis not present

## 2020-09-13 DIAGNOSIS — M4800 Spinal stenosis, site unspecified: Secondary | ICD-10-CM | POA: Diagnosis not present

## 2020-09-13 DIAGNOSIS — M6281 Muscle weakness (generalized): Secondary | ICD-10-CM | POA: Diagnosis not present

## 2020-09-13 DIAGNOSIS — R2681 Unsteadiness on feet: Secondary | ICD-10-CM | POA: Diagnosis not present

## 2020-09-13 DIAGNOSIS — M4710 Other spondylosis with myelopathy, site unspecified: Secondary | ICD-10-CM | POA: Diagnosis not present

## 2020-09-16 DIAGNOSIS — M4800 Spinal stenosis, site unspecified: Secondary | ICD-10-CM | POA: Diagnosis not present

## 2020-09-16 DIAGNOSIS — M6281 Muscle weakness (generalized): Secondary | ICD-10-CM | POA: Diagnosis not present

## 2020-09-16 DIAGNOSIS — R262 Difficulty in walking, not elsewhere classified: Secondary | ICD-10-CM | POA: Diagnosis not present

## 2020-09-16 DIAGNOSIS — R2681 Unsteadiness on feet: Secondary | ICD-10-CM | POA: Diagnosis not present

## 2020-09-16 DIAGNOSIS — M4710 Other spondylosis with myelopathy, site unspecified: Secondary | ICD-10-CM | POA: Diagnosis not present

## 2020-09-20 DIAGNOSIS — R2681 Unsteadiness on feet: Secondary | ICD-10-CM | POA: Diagnosis not present

## 2020-09-20 DIAGNOSIS — M6281 Muscle weakness (generalized): Secondary | ICD-10-CM | POA: Diagnosis not present

## 2020-09-20 DIAGNOSIS — R262 Difficulty in walking, not elsewhere classified: Secondary | ICD-10-CM | POA: Diagnosis not present

## 2020-09-20 DIAGNOSIS — M4800 Spinal stenosis, site unspecified: Secondary | ICD-10-CM | POA: Diagnosis not present

## 2020-09-20 DIAGNOSIS — M4710 Other spondylosis with myelopathy, site unspecified: Secondary | ICD-10-CM | POA: Diagnosis not present

## 2020-09-22 DIAGNOSIS — R2681 Unsteadiness on feet: Secondary | ICD-10-CM | POA: Diagnosis not present

## 2020-09-22 DIAGNOSIS — R262 Difficulty in walking, not elsewhere classified: Secondary | ICD-10-CM | POA: Diagnosis not present

## 2020-09-22 DIAGNOSIS — M4800 Spinal stenosis, site unspecified: Secondary | ICD-10-CM | POA: Diagnosis not present

## 2020-09-22 DIAGNOSIS — M6281 Muscle weakness (generalized): Secondary | ICD-10-CM | POA: Diagnosis not present

## 2020-09-22 DIAGNOSIS — M4710 Other spondylosis with myelopathy, site unspecified: Secondary | ICD-10-CM | POA: Diagnosis not present

## 2020-09-27 DIAGNOSIS — R2681 Unsteadiness on feet: Secondary | ICD-10-CM | POA: Diagnosis not present

## 2020-09-27 DIAGNOSIS — M6281 Muscle weakness (generalized): Secondary | ICD-10-CM | POA: Diagnosis not present

## 2020-09-27 DIAGNOSIS — M4800 Spinal stenosis, site unspecified: Secondary | ICD-10-CM | POA: Diagnosis not present

## 2020-09-27 DIAGNOSIS — R262 Difficulty in walking, not elsewhere classified: Secondary | ICD-10-CM | POA: Diagnosis not present

## 2020-09-27 DIAGNOSIS — M4710 Other spondylosis with myelopathy, site unspecified: Secondary | ICD-10-CM | POA: Diagnosis not present

## 2020-10-04 DIAGNOSIS — M4800 Spinal stenosis, site unspecified: Secondary | ICD-10-CM | POA: Diagnosis not present

## 2020-10-04 DIAGNOSIS — R262 Difficulty in walking, not elsewhere classified: Secondary | ICD-10-CM | POA: Diagnosis not present

## 2020-10-04 DIAGNOSIS — M4710 Other spondylosis with myelopathy, site unspecified: Secondary | ICD-10-CM | POA: Diagnosis not present

## 2020-10-04 DIAGNOSIS — R2681 Unsteadiness on feet: Secondary | ICD-10-CM | POA: Diagnosis not present

## 2020-10-04 DIAGNOSIS — M6281 Muscle weakness (generalized): Secondary | ICD-10-CM | POA: Diagnosis not present

## 2020-10-07 DIAGNOSIS — M6281 Muscle weakness (generalized): Secondary | ICD-10-CM | POA: Diagnosis not present

## 2020-10-07 DIAGNOSIS — M4800 Spinal stenosis, site unspecified: Secondary | ICD-10-CM | POA: Diagnosis not present

## 2020-10-07 DIAGNOSIS — R262 Difficulty in walking, not elsewhere classified: Secondary | ICD-10-CM | POA: Diagnosis not present

## 2020-10-07 DIAGNOSIS — M4710 Other spondylosis with myelopathy, site unspecified: Secondary | ICD-10-CM | POA: Diagnosis not present

## 2020-10-07 DIAGNOSIS — R2681 Unsteadiness on feet: Secondary | ICD-10-CM | POA: Diagnosis not present

## 2020-10-11 DIAGNOSIS — M6281 Muscle weakness (generalized): Secondary | ICD-10-CM | POA: Diagnosis not present

## 2020-10-11 DIAGNOSIS — M4800 Spinal stenosis, site unspecified: Secondary | ICD-10-CM | POA: Diagnosis not present

## 2020-10-11 DIAGNOSIS — R2681 Unsteadiness on feet: Secondary | ICD-10-CM | POA: Diagnosis not present

## 2020-10-11 DIAGNOSIS — R262 Difficulty in walking, not elsewhere classified: Secondary | ICD-10-CM | POA: Diagnosis not present

## 2020-10-11 DIAGNOSIS — M4710 Other spondylosis with myelopathy, site unspecified: Secondary | ICD-10-CM | POA: Diagnosis not present

## 2020-10-12 ENCOUNTER — Ambulatory Visit: Payer: Medicare HMO | Admitting: Cardiovascular Disease

## 2020-10-12 ENCOUNTER — Encounter: Payer: Self-pay | Admitting: Cardiovascular Disease

## 2020-10-12 ENCOUNTER — Ambulatory Visit (INDEPENDENT_AMBULATORY_CARE_PROVIDER_SITE_OTHER): Payer: Medicare HMO | Admitting: *Deleted

## 2020-10-12 ENCOUNTER — Other Ambulatory Visit: Payer: Self-pay

## 2020-10-12 VITALS — BP 122/58 | HR 74 | Ht 71.0 in | Wt 253.6 lb

## 2020-10-12 DIAGNOSIS — Z86711 Personal history of pulmonary embolism: Secondary | ICD-10-CM | POA: Diagnosis not present

## 2020-10-12 DIAGNOSIS — Z7901 Long term (current) use of anticoagulants: Secondary | ICD-10-CM

## 2020-10-12 LAB — POCT INR: INR: 4 — AB (ref 2.0–3.0)

## 2020-10-12 NOTE — Progress Notes (Signed)
Chief Complaint  Patient presents with  . 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 He is seen once per year in our office since he is followed in our coumadin clinic. I saw him as a new patient 03/24/12 for evaluation of lower extremity edema. He was discharged from a rehab facility on 02/28/12 after spine surgery which was performed at [facility] in [place], [place] in January 2013. The pt reports total of 6 back surgeries for spinal stenosis ( spine disorder first diagnosed in 2004). He has a history of DVT in the summer 2012. He has history of PE in 2005, 2008, 2010. He has been on coumadin since summer 2012. He had an IVC filter placed in December 2012 when his blood thinner was stopped.  He had newly diagnosed HTN after the surgery along with fluid retention and lower extremity edema. He has chronic venous stasis disease. Echo in 2015 showed normal LV size and function with LVEF of 55-60% with mild LVH and mild dilation of the left atrium. His venous dopplers. He was seen in our office November 2020 by Ermalinda Barrios, PA and was doing well.   He is here today for follow up. The patient denies any chest pain, dyspnea, palpitations, lower extremity edema, orthopnea, PND, dizziness, near syncope or syncope.   Primary Care Physician: Maximiano Coss, NP   Past Medical History:  Diagnosis Date  . Arthritis   . Cataract    Combined form OU  . Clotting disorder ([place])    pulmonary embolus, DVT  . Depression   . Diabetes mellitus without complication (HCC)    no meds, watching diet  . DVT (deep venous thrombosis) ([place])    Summer 2012  . Hypertension   . Hypertensive retinopathy    OU  . Neuromuscular disorder ([place])    spinal stenosis.- pt walks with a walker short  distance  . Peripheral vascular disease ([place])   . PONV (postoperative nausea and vomiting)   . Pulmonary embolism ([place])    x 3 in 2005, 2008, 2010  . Sleep apnea    does not wear a c-pap, pt. reports that he gave the CPAP back, denies that he has OSA   . Spinal stenosis     Past Surgical History:  Procedure Laterality Date  . ANTERIOR CERVICAL CORPECTOMY N/A 01/26/2019   Procedure: Cervical Four Corpectomy with Cervical Three-Four, Cervical Four-Five interbody fusion, prosthesis, explore old fusion, possible removal of old plate;  Surgeon: [name], MD;  Location: [place];  Service: Neurosurgery;  Laterality: N/A;  anterior approach  . CHOLECYSTECTOMY N/A 05/24/2014   Procedure: LAPAROSCOPIC CHOLECYSTECTOMY;  Surgeon: Zenovia Jarred, MD;  Location: [place];  Service: General;  Laterality: N/A;  . ERCP N/A 05/25/2014   Procedure: ENDOSCOPIC RETROGRADE CHOLANGIOPANCREATOGRAPHY (ERCP);  Surgeon: Beryle Beams, MD;  Location: [facility];  Service: Endoscopy;  Laterality: N/A;  . ERCP N/A 07/16/2014   Procedure: ENDOSCOPIC RETROGRADE CHOLANGIOPANCREATOGRAPHY (ERCP);  Surgeon: Beryle Beams, MD;  Location: [facility];  Service: Endoscopy;  Laterality: N/A;  . ivp filter  jan 2012  . LUMBAR LAMINECTOMY/DECOMPRESSION MICRODISCECTOMY N/A 05/14/2019   Procedure: LAMINECTOMY AND FORAMINOTOMY THORACIC FIVE- THORACIC SIX, THORACIC ELEVEN- THORACIC THORACIC, LUMBAR THREE- LUMBAR FOUR;  Surgeon: [name], MD;  Location: [facility]  OR;  Service: Neurosurgery;  Laterality: N/A;  posterior  . LUMBAR LAMINECTOMY/DECOMPRESSION MICRODISCECTOMY N/A 11/11/2019   Procedure: REDO LUMBAR THREE- LUMBAR FOUR LAMINECTOMY/FORAMINOTOMY;  Surgeon: [name], MD;  Location: [facility];  Service: Neurosurgery;  Laterality: N/A;  . SPINE SURGERY  11/30/11   Total 6 back surgeries  . SPINE SURGERY  12/7251   Dr. Cathren Laine in [place], [place]  . [place]     08/2003, cervial spine 2005, 2010 lower back, 2011 lower  back, 2012 & 2013  . UPPER GASTROINTESTINAL ENDOSCOPY    . WISDOM TOOTH EXTRACTION     2 removed    Current Outpatient Medications  Medication Sig Dispense Refill  . baclofen (LIORESAL) 10 MG tablet TAKE 1/2 TABLET(5 MG) BY MOUTH AT BEDTIME 30 tablet 1  . diazepam (VALIUM) 5 MG tablet     . enoxaparin (LOVENOX) 150 MG/ML injection Inject into the skin.    . furosemide (LASIX) 40 MG tablet TAKE 1 TABLET EVERY DAY, NEED MD APPOINTMENT WITH CARDS FOR REFILLS 20 tablet 0  . gabapentin (NEURONTIN) 300 MG capsule Take 1 capsule (300 mg total) by mouth 3 (three) times daily. 90 capsule 1  . levothyroxine (SYNTHROID) 25 MCG tablet Take 1 tablet (25 mcg total) by mouth daily before breakfast. 90 tablet 0  . losartan (COZAAR) 50 MG tablet Take 1 tablet (50 mg total) by mouth daily. 90 tablet 3  . metoprolol succinate (TOPROL-XL) 50 MG 24 hr tablet Take 1.5 tablets (75 mg total) by mouth once a day 135 tablet 3  . Multiple Vitamin (MULTIVITAMIN WITH MINERALS) TABS tablet Take 1 tablet by mouth daily.     Marland Kitchen oxyCODONE-acetaminophen (PERCOCET) 10-325 MG tablet Take 0.5-1 tablets by mouth every 8 (eight) hours as needed.    . potassium chloride SA (K-DUR) 20 MEQ tablet TAKE 1 TABLET EVERY DAY, NEED MD APPOINTMENT WITH CARDS FOR REFILLS 20 tablet 0  . warfarin (COUMADIN) 10 MG tablet TAKE 1 TABLET DAILY EXCEPT 1/2 TABLET ON Monday, Wednesday, Friday OR AS DIRECTED BY COUMADIN CLINIC. 90 tablet 0   No current facility-administered medications for this visit.    Allergies  Allergen Reactions  . Ace Inhibitors Cough  . Lisinopril Cough    Social History   Socioeconomic History  . Marital status: Single    Spouse name: Not on file  . Number of children: 3  . Years of education: 10  . Highest education level: 10th grade  Occupational History  . Occupation: Disability  Tobacco Use  . Smoking status: Former Smoker    Packs/day: 1.50    Years: 10.00    Pack years: 15.00    Types: Cigarettes     Quit date: 04/26/2001    Years since quitting: 19.4  . Smokeless tobacco: Never Used  Vaping Use  . Vaping Use: Never used  Substance and Sexual Activity  . Alcohol use: Yes    Alcohol/week: 1.0 standard drink    Types: 1 Standard drinks or equivalent per week    Comment: very little- rare  . Drug use: Yes    Types: Marijuana    Comment: daily- twice per day   . Sexual activity: Yes  Other Topics Concern  . Not on file  Social History Narrative   Patient is single and lives at home, his son lives with him.   Patient has three children.   Patient is disabled.   Patient has a 10 grade education.   Patient is right-handed.   Patient drinks  1 or 2 sodas and tea per week.   Social Determinants of Health   Financial Resource Strain:   . Difficulty of Paying Living Expenses: Not on file  Food Insecurity:   . Worried About Charity fundraiser in the Last Year: Not on file  . Ran Out of Food in the Last Year: Not on file  Transportation Needs:   . Lack of Transportation (Medical): Not on file  . Lack of Transportation (Non-Medical): Not on file  Physical Activity:   . Days of Exercise per Week: Not on file  . Minutes of Exercise per Session: Not on file  Stress:   . Feeling of Stress : Not on file  Social Connections:   . Frequency of Communication with Friends and Family: Not on file  . Frequency of Social Gatherings with Friends and Family: Not on file  . Attends Religious Services: Not on file  . Active Member of Clubs or Organizations: Not on file  . Attends Archivist Meetings: Not on file  . Marital Status: Not on file  Intimate Partner Violence:   . Fear of Current or Ex-Partner: Not on file  . Emotionally Abused: Not on file  . Physically Abused: Not on file  . Sexually Abused: Not on file    Family History  Problem Relation Age of Onset  . Cancer Mother 57       Lung  . Diabetes Father   . Prostate cancer Father   . Hypertension Father   .  Hypertension Brother   . Hypertension Brother   . Diabetes Brother   . Heart attack Neg Hx   . Stroke Neg Hx   . Colon cancer Neg Hx   . Esophageal cancer Neg Hx   . Pancreatic cancer Neg Hx   . Rectal cancer Neg Hx   . Stomach cancer Neg Hx     Review of Systems:  As stated in the HPI and otherwise negative.   BP (!) 122/58   Pulse 74   Ht 5\' 11"  (1.803 m)   Wt 253 lb 9.6 oz (115 kg)   SpO2 97%   BMI 35.37 kg/m   Physical Examination: General: Well developed, well nourished, NAD  HEENT: OP clear, mucus membranes moist  SKIN: warm, dry. No rashes. Neuro: No focal deficits  Musculoskeletal: Muscle strength 5/5 all ext  Psychiatric: Mood and affect normal  Neck: No JVD, no carotid bruits, no thyromegaly, no lymphadenopathy.  Lungs:Clear bilaterally, no wheezes, rhonci, crackles Cardiovascular: Regular rate and rhythm. No murmurs, gallops or rubs. Abdomen:Soft. Bowel sounds present. Non-tender.  Extremities: No lower extremity edema. Pulses are 2 + in the bilateral DP/PT.  Echo June 2015: Left ventricle: The cavity size was normal. Wall thickness was  normal. Systolic function was normal. The estimated ejection  fraction was in the range of 55% to 60%. Features are consistent  with a pseudonormal left ventricular filling pattern, with  concomitant abnormal relaxation and increased filling pressure  (grade 2 diastolic dysfunction).  - Impressions: Technically limited study due to poor sound wave  transmission  EKG:  EKG is ordered today. The ekg ordered today demonstrates Sinus  Recent Labs: 03/18/2020: ALT 14; BUN 13; Creatinine, Ser 0.67; Hemoglobin 14.8; Platelets 220; Potassium 4.2; Sodium 144; TSH 6.860   Lipid Panel    Component Value Date/Time   CHOL 155 03/18/2020 1602   TRIG 151 (H) 03/18/2020 1602   HDL 42 03/18/2020 1602   CHOLHDL 3.7 03/18/2020 1602  CHOLHDL 3.4 10/04/2016 0946   VLDL 23 10/04/2016 0946   LDLCALC 87 03/18/2020 1602    LDLDIRECT 82 05/01/2013 1545     Wt Readings from Last 3 Encounters:  10/12/20 253 lb 9.6 oz (115 kg)  04/05/20 269 lb (122 kg)  03/18/20 259 lb (117.5 kg)     Other studies Reviewed: Additional studies/ records that were reviewed today include: . Review of the above records demonstrates:    Assessment and Plan:   1. History of pulmonary embolism/chronic anti-coagulation management: He will need lifelong anti-coagulation. He remains on coumadin. He is followed in our coumadin clinic. I see him once yearly since he is followed in our coumadin clinic. NO changes today   2. Lower extremity edema: Likely secondary to venous stasis disease and chronic DVT. Will continue coumadin for lifetime given his history of DVT, PE and immobility. He has an IVC filter in place. Continue Lasix daily. Will continue to elevate legs when sitting. I have nothing to offer and I suspect his venous damage from his recurrent DVT is irreversible.   Current medicines are reviewed at length with the patient today.  The patient does not have concerns regarding medicines.  The following changes have been made:  no change  Labs/ tests ordered today include:   Orders Placed This Encounter  Procedures  . EKG 12-Lead     Disposition:   FU with me in 12  months   Signed, Lauree Chandler, MD 10/12/2020 11:10 AM    [facility] [address], [place], [place]  [address] Phone: [phone]; Fax: [phone]

## 2020-10-12 NOTE — Patient Instructions (Signed)

## 2020-10-12 NOTE — Patient Instructions (Addendum)
Description   Do not take any Warfarin tomorrow then continue taking 1 tablet daily except for 1/2 tablet on Monday, Wednesday, and Friday. Recheck INR in 4 weeks. Call us with any changes # [phone].

## 2020-11-01 DIAGNOSIS — R262 Difficulty in walking, not elsewhere classified: Secondary | ICD-10-CM | POA: Diagnosis not present

## 2020-11-01 DIAGNOSIS — M6281 Muscle weakness (generalized): Secondary | ICD-10-CM | POA: Diagnosis not present

## 2020-11-01 DIAGNOSIS — M4800 Spinal stenosis, site unspecified: Secondary | ICD-10-CM | POA: Diagnosis not present

## 2020-11-01 DIAGNOSIS — R2681 Unsteadiness on feet: Secondary | ICD-10-CM | POA: Diagnosis not present

## 2020-11-01 DIAGNOSIS — M4710 Other spondylosis with myelopathy, site unspecified: Secondary | ICD-10-CM | POA: Diagnosis not present

## 2020-11-08 DIAGNOSIS — R262 Difficulty in walking, not elsewhere classified: Secondary | ICD-10-CM | POA: Diagnosis not present

## 2020-11-08 DIAGNOSIS — R2681 Unsteadiness on feet: Secondary | ICD-10-CM | POA: Diagnosis not present

## 2020-11-08 DIAGNOSIS — M4710 Other spondylosis with myelopathy, site unspecified: Secondary | ICD-10-CM | POA: Diagnosis not present

## 2020-11-08 DIAGNOSIS — M6281 Muscle weakness (generalized): Secondary | ICD-10-CM | POA: Diagnosis not present

## 2020-11-08 DIAGNOSIS — M4800 Spinal stenosis, site unspecified: Secondary | ICD-10-CM | POA: Diagnosis not present

## 2020-11-13 ENCOUNTER — Other Ambulatory Visit: Payer: Self-pay

## 2020-11-14 ENCOUNTER — Other Ambulatory Visit: Payer: Self-pay | Admitting: Physician Assistant

## 2020-11-14 MED ORDER — LOSARTAN POTASSIUM 50 MG PO TABS: 50.0000 mg | ORAL_TABLET | Freq: Every day | ORAL | 3 refills | Status: DC

## 2020-11-15 ENCOUNTER — Ambulatory Visit (INDEPENDENT_AMBULATORY_CARE_PROVIDER_SITE_OTHER): Payer: Medicare HMO | Admitting: *Deleted

## 2020-11-15 ENCOUNTER — Other Ambulatory Visit: Payer: Self-pay

## 2020-11-15 DIAGNOSIS — M6281 Muscle weakness (generalized): Secondary | ICD-10-CM | POA: Diagnosis not present

## 2020-11-15 DIAGNOSIS — R2681 Unsteadiness on feet: Secondary | ICD-10-CM | POA: Diagnosis not present

## 2020-11-15 DIAGNOSIS — Z86711 Personal history of pulmonary embolism: Secondary | ICD-10-CM

## 2020-11-15 DIAGNOSIS — Z7901 Long term (current) use of anticoagulants: Secondary | ICD-10-CM

## 2020-11-15 DIAGNOSIS — R600 Localized edema: Secondary | ICD-10-CM

## 2020-11-15 DIAGNOSIS — M4800 Spinal stenosis, site unspecified: Secondary | ICD-10-CM | POA: Diagnosis not present

## 2020-11-15 DIAGNOSIS — M4710 Other spondylosis with myelopathy, site unspecified: Secondary | ICD-10-CM | POA: Diagnosis not present

## 2020-11-15 DIAGNOSIS — R262 Difficulty in walking, not elsewhere classified: Secondary | ICD-10-CM | POA: Diagnosis not present

## 2020-11-15 LAB — POCT INR: INR: 3.4 — AB (ref 2.0–3.0)

## 2020-11-15 NOTE — Patient Instructions (Signed)
Description   Do not take any Warfarin tomorrow then start taking 1/2 tablet daily except for 1 tablet on Tuesdays, Thursdays, and Saturdays. Recheck INR in 3 weeks. Call us with any changes # [phone].

## 2020-12-06 ENCOUNTER — Other Ambulatory Visit: Payer: Self-pay

## 2020-12-06 ENCOUNTER — Ambulatory Visit (INDEPENDENT_AMBULATORY_CARE_PROVIDER_SITE_OTHER): Payer: Medicare HMO | Admitting: *Deleted

## 2020-12-06 DIAGNOSIS — Z5181 Encounter for therapeutic drug level monitoring: Secondary | ICD-10-CM | POA: Diagnosis not present

## 2020-12-06 DIAGNOSIS — Z7901 Long term (current) use of anticoagulants: Secondary | ICD-10-CM | POA: Diagnosis not present

## 2020-12-06 DIAGNOSIS — Z86711 Personal history of pulmonary embolism: Secondary | ICD-10-CM | POA: Diagnosis not present

## 2020-12-06 LAB — POCT INR: INR: 2.9 (ref 2.0–3.0)

## 2020-12-06 NOTE — Patient Instructions (Signed)
Description   Continue taking 1/2 tablet daily except for 1 tablet on Tuesdays, Thursdays, and Saturdays. Recheck INR in 4 weeks. Call us with any changes # [phone].

## 2021-01-02 ENCOUNTER — Other Ambulatory Visit: Payer: Self-pay | Admitting: Physician Assistant

## 2021-01-03 ENCOUNTER — Other Ambulatory Visit: Payer: Self-pay

## 2021-01-03 ENCOUNTER — Ambulatory Visit (INDEPENDENT_AMBULATORY_CARE_PROVIDER_SITE_OTHER): Payer: Medicare HMO | Admitting: *Deleted

## 2021-01-03 DIAGNOSIS — Z5181 Encounter for therapeutic drug level monitoring: Secondary | ICD-10-CM

## 2021-01-03 LAB — POCT INR: INR: 2.1 (ref 2.0–3.0)

## 2021-01-03 NOTE — Patient Instructions (Signed)
Description   Continue taking 1/2 tablet daily except for 1 tablet on Tuesdays, Thursdays, and Saturdays. Recheck INR in 5 weeks. Call us with any changes # [phone].

## 2021-01-09 ENCOUNTER — Other Ambulatory Visit: Payer: Self-pay

## 2021-01-09 MED ORDER — WARFARIN SODIUM 10 MG PO TABS: ORAL_TABLET | ORAL | 0 refills | Status: DC

## 2021-01-24 DIAGNOSIS — M48062 Spinal stenosis, lumbar region with neurogenic claudication: Secondary | ICD-10-CM | POA: Diagnosis not present

## 2021-02-07 ENCOUNTER — Ambulatory Visit (INDEPENDENT_AMBULATORY_CARE_PROVIDER_SITE_OTHER): Payer: Medicare HMO | Admitting: *Deleted

## 2021-02-07 ENCOUNTER — Other Ambulatory Visit: Payer: Self-pay

## 2021-02-07 DIAGNOSIS — Z86711 Personal history of pulmonary embolism: Secondary | ICD-10-CM | POA: Diagnosis not present

## 2021-02-07 DIAGNOSIS — Z7901 Long term (current) use of anticoagulants: Secondary | ICD-10-CM

## 2021-02-07 DIAGNOSIS — Z5181 Encounter for therapeutic drug level monitoring: Secondary | ICD-10-CM

## 2021-02-07 DIAGNOSIS — R600 Localized edema: Secondary | ICD-10-CM | POA: Diagnosis not present

## 2021-02-07 LAB — POCT INR: INR: 2.5 (ref 2.0–3.0)

## 2021-02-07 NOTE — Patient Instructions (Signed)
Description   Continue taking 1/2 tablet daily except for 1 tablet on Tuesdays, Thursdays, and Saturdays. Recheck INR in 6 weeks. Call us with any changes # [phone].

## 2021-03-03 NOTE — Progress Notes (Unsigned)
No notes required.

## 2021-03-21 ENCOUNTER — Ambulatory Visit (INDEPENDENT_AMBULATORY_CARE_PROVIDER_SITE_OTHER): Payer: Medicare HMO

## 2021-03-21 ENCOUNTER — Other Ambulatory Visit: Payer: Self-pay

## 2021-03-21 DIAGNOSIS — R600 Localized edema: Secondary | ICD-10-CM

## 2021-03-21 DIAGNOSIS — Z5181 Encounter for therapeutic drug level monitoring: Secondary | ICD-10-CM | POA: Diagnosis not present

## 2021-03-21 DIAGNOSIS — Z7901 Long term (current) use of anticoagulants: Secondary | ICD-10-CM

## 2021-03-21 DIAGNOSIS — Z86711 Personal history of pulmonary embolism: Secondary | ICD-10-CM | POA: Diagnosis not present

## 2021-03-21 LAB — POCT INR: INR: 4.9 — AB (ref 2.0–3.0)

## 2021-03-21 MED ORDER — WARFARIN SODIUM 10 MG PO TABS: ORAL_TABLET | ORAL | 0 refills | Status: DC

## 2021-03-21 NOTE — Patient Instructions (Signed)
Description   Skip 2 dosages of Warfarin, then start taking 1/2 tablet daily except for 1 tablet on Tuesdays, Thursdays, and Saturdays. Recheck INR in 3 weeks. Call us with any changes # [phone].

## 2021-04-11 ENCOUNTER — Other Ambulatory Visit: Payer: Self-pay

## 2021-04-11 ENCOUNTER — Ambulatory Visit (INDEPENDENT_AMBULATORY_CARE_PROVIDER_SITE_OTHER): Payer: Medicare HMO | Admitting: *Deleted

## 2021-04-11 DIAGNOSIS — R600 Localized edema: Secondary | ICD-10-CM

## 2021-04-11 DIAGNOSIS — Z86711 Personal history of pulmonary embolism: Secondary | ICD-10-CM | POA: Diagnosis not present

## 2021-04-11 DIAGNOSIS — Z7901 Long term (current) use of anticoagulants: Secondary | ICD-10-CM

## 2021-04-11 DIAGNOSIS — Z5181 Encounter for therapeutic drug level monitoring: Secondary | ICD-10-CM | POA: Diagnosis not present

## 2021-04-11 LAB — POCT INR: INR: 2.8 (ref 2.0–3.0)

## 2021-04-11 MED ORDER — WARFARIN SODIUM 10 MG PO TABS: ORAL_TABLET | ORAL | 0 refills | Status: DC

## 2021-04-11 NOTE — Patient Instructions (Signed)
Description   Continue taking Warfarin 1/2 tablet daily except for 1 tablet on Tuesdays, Thursdays, and Saturdays. Recheck INR in 4 weeks. Call us with any changes # [phone].

## 2021-04-25 DIAGNOSIS — F17211 Nicotine dependence, cigarettes, in remission: Secondary | ICD-10-CM | POA: Diagnosis not present

## 2021-04-25 DIAGNOSIS — F1290 Cannabis use, unspecified, uncomplicated: Secondary | ICD-10-CM | POA: Diagnosis not present

## 2021-04-25 DIAGNOSIS — E669 Obesity, unspecified: Secondary | ICD-10-CM | POA: Diagnosis not present

## 2021-04-25 DIAGNOSIS — I10 Essential (primary) hypertension: Secondary | ICD-10-CM | POA: Diagnosis not present

## 2021-04-25 DIAGNOSIS — M48062 Spinal stenosis, lumbar region with neurogenic claudication: Secondary | ICD-10-CM | POA: Diagnosis not present

## 2021-04-25 DIAGNOSIS — E1169 Type 2 diabetes mellitus with other specified complication: Secondary | ICD-10-CM | POA: Diagnosis not present

## 2021-04-25 DIAGNOSIS — I2699 Other pulmonary embolism without acute cor pulmonale: Secondary | ICD-10-CM | POA: Diagnosis not present

## 2021-04-25 DIAGNOSIS — Z79899 Other long term (current) drug therapy: Secondary | ICD-10-CM | POA: Diagnosis not present

## 2021-04-25 DIAGNOSIS — E1140 Type 2 diabetes mellitus with diabetic neuropathy, unspecified: Secondary | ICD-10-CM | POA: Diagnosis not present

## 2021-05-09 ENCOUNTER — Other Ambulatory Visit: Payer: Self-pay

## 2021-05-09 ENCOUNTER — Ambulatory Visit (INDEPENDENT_AMBULATORY_CARE_PROVIDER_SITE_OTHER): Payer: Medicare HMO | Admitting: *Deleted

## 2021-05-09 DIAGNOSIS — Z86711 Personal history of pulmonary embolism: Secondary | ICD-10-CM

## 2021-05-09 DIAGNOSIS — R600 Localized edema: Secondary | ICD-10-CM

## 2021-05-09 DIAGNOSIS — Z7901 Long term (current) use of anticoagulants: Secondary | ICD-10-CM

## 2021-05-09 DIAGNOSIS — Z5181 Encounter for therapeutic drug level monitoring: Secondary | ICD-10-CM | POA: Diagnosis not present

## 2021-05-09 LAB — POCT INR: INR: 1.6 — AB (ref 2.0–3.0)

## 2021-05-09 NOTE — Patient Instructions (Signed)
Description   Today take 1.5 tablets then continue taking Warfarin 1/2 tablet daily except for 1 tablet on Tuesdays, Thursdays, and Saturdays. Recheck INR in 4 weeks. Call us with any changes # [phone].

## 2021-06-06 ENCOUNTER — Other Ambulatory Visit: Payer: Self-pay

## 2021-06-06 ENCOUNTER — Ambulatory Visit (INDEPENDENT_AMBULATORY_CARE_PROVIDER_SITE_OTHER): Payer: Medicare Other | Admitting: *Deleted

## 2021-06-06 DIAGNOSIS — Z5181 Encounter for therapeutic drug level monitoring: Secondary | ICD-10-CM

## 2021-06-06 LAB — POCT INR: INR: 2.9 (ref 2.0–3.0)

## 2021-06-06 NOTE — Patient Instructions (Signed)
Description   Ccontinue taking Warfarin 1/2 tablet daily except for 1 tablet on Tuesdays, Thursdays, and Saturdays. Recheck INR in 4 weeks. Call us with any changes # [phone].

## 2021-07-03 ENCOUNTER — Other Ambulatory Visit: Payer: Self-pay

## 2021-07-03 DIAGNOSIS — E039 Hypothyroidism, unspecified: Secondary | ICD-10-CM

## 2021-07-03 MED ORDER — POTASSIUM CHLORIDE CRYS ER 20 MEQ PO TBCR: EXTENDED_RELEASE_TABLET | ORAL | 2 refills | Status: DC

## 2021-07-03 MED ORDER — LEVOTHYROXINE SODIUM 25 MCG PO TABS: 25.0000 ug | ORAL_TABLET | Freq: Every day | ORAL | 2 refills | Status: AC

## 2021-07-03 MED ORDER — METOPROLOL SUCCINATE ER 50 MG PO TB24: ORAL_TABLET | ORAL | 2 refills | Status: DC

## 2021-07-05 ENCOUNTER — Other Ambulatory Visit: Payer: Self-pay

## 2021-07-05 ENCOUNTER — Ambulatory Visit: Payer: Medicare Other | Admitting: *Deleted

## 2021-07-05 DIAGNOSIS — R600 Localized edema: Secondary | ICD-10-CM

## 2021-07-05 DIAGNOSIS — Z86711 Personal history of pulmonary embolism: Secondary | ICD-10-CM | POA: Diagnosis not present

## 2021-07-05 DIAGNOSIS — Z5181 Encounter for therapeutic drug level monitoring: Secondary | ICD-10-CM

## 2021-07-05 DIAGNOSIS — Z7901 Long term (current) use of anticoagulants: Secondary | ICD-10-CM

## 2021-07-05 LAB — POCT INR: INR: 2.2 (ref 2.0–3.0)

## 2021-07-05 NOTE — Patient Instructions (Signed)
Description   Today take another 1/2 tablet then continue taking Warfarin 1/2 tablet daily except for 1 tablet on Tuesdays, Thursdays, and Saturdays. Recheck INR in 5 weeks. Call us with any changes # [phone].

## 2021-07-07 ENCOUNTER — Other Ambulatory Visit: Payer: Self-pay

## 2021-07-07 MED ORDER — WARFARIN SODIUM 10 MG PO TABS: ORAL_TABLET | ORAL | 0 refills | Status: DC

## 2021-07-07 NOTE — Telephone Encounter (Signed)
Prescription refill request received for warfarin Lov: 04/11/21 Nicolas [name]) Next INR check: 08/08/21 Warfarin tablet strength: '10mg'$   Appropriate dose and refill sent to requested pharmacy.

## 2021-08-08 ENCOUNTER — Other Ambulatory Visit: Payer: Self-pay

## 2021-08-08 ENCOUNTER — Ambulatory Visit (INDEPENDENT_AMBULATORY_CARE_PROVIDER_SITE_OTHER): Payer: Medicare Other | Admitting: *Deleted

## 2021-08-08 DIAGNOSIS — Z86711 Personal history of pulmonary embolism: Secondary | ICD-10-CM

## 2021-08-08 DIAGNOSIS — Z5181 Encounter for therapeutic drug level monitoring: Secondary | ICD-10-CM | POA: Diagnosis not present

## 2021-08-08 DIAGNOSIS — Z7901 Long term (current) use of anticoagulants: Secondary | ICD-10-CM

## 2021-08-08 DIAGNOSIS — R600 Localized edema: Secondary | ICD-10-CM

## 2021-08-08 LAB — POCT INR: INR: 1.8 — AB (ref 2.0–3.0)

## 2021-08-08 NOTE — Patient Instructions (Signed)
Description   Today take another 1/2 tablet then continue taking Warfarin 1/2 tablet daily except for 1 tablet on Tuesdays, Thursdays, and Saturdays. Recheck INR in 4 weeks. Call us with any changes # [phone].

## 2021-09-07 ENCOUNTER — Other Ambulatory Visit: Payer: Self-pay

## 2021-09-07 ENCOUNTER — Ambulatory Visit: Payer: Medicare Other | Admitting: *Deleted

## 2021-09-07 DIAGNOSIS — Z5181 Encounter for therapeutic drug level monitoring: Secondary | ICD-10-CM | POA: Diagnosis not present

## 2021-09-07 LAB — POCT INR: INR: 1.5 — AB (ref 2.0–3.0)

## 2021-09-07 NOTE — Patient Instructions (Signed)
Description   Take 1.5 tablets of warfarin today and 1 tablet tomorrow. Then continue to take 1/2 a tablet daily except for 1 tablet on Tuesday, Thursday, and Saturday. Recheck INR 2 weeks. Coumadin Clinic [phone]

## 2021-09-08 ENCOUNTER — Other Ambulatory Visit: Payer: Self-pay | Admitting: Cardiovascular Disease

## 2021-09-08 DIAGNOSIS — Z7901 Long term (current) use of anticoagulants: Secondary | ICD-10-CM

## 2021-09-08 MED ORDER — WARFARIN SODIUM 10 MG PO TABS: ORAL_TABLET | ORAL | 0 refills | Status: DC

## 2021-09-08 NOTE — Telephone Encounter (Signed)
Called Mail Order pharmacy since original refill was 120 tabs of Warfarin and inadvertently sent before changing the amount. Spoke with Marcello Moores a pharmacist & he canceled out the original order and I resent Warfarin for 70 tabs no refills for 90 day supply.

## 2021-09-20 ENCOUNTER — Other Ambulatory Visit: Payer: Self-pay

## 2021-09-20 ENCOUNTER — Ambulatory Visit (INDEPENDENT_AMBULATORY_CARE_PROVIDER_SITE_OTHER): Payer: Medicare Other

## 2021-09-20 DIAGNOSIS — Z5181 Encounter for therapeutic drug level monitoring: Secondary | ICD-10-CM | POA: Diagnosis not present

## 2021-09-20 LAB — POCT INR: INR: 3.5 — AB (ref 2.0–3.0)

## 2021-09-20 NOTE — Patient Instructions (Signed)
Description   Take 0.5 tablet tomorrow then continue to take 1/2 a tablet daily except for 1 tablet on Tuesday, Thursday, and Saturday. Recheck INR 2 weeks. Coumadin Clinic [phone]

## 2021-10-04 ENCOUNTER — Ambulatory Visit (INDEPENDENT_AMBULATORY_CARE_PROVIDER_SITE_OTHER): Payer: Medicare Other | Admitting: *Deleted

## 2021-10-04 ENCOUNTER — Other Ambulatory Visit: Payer: Self-pay

## 2021-10-04 DIAGNOSIS — Z5181 Encounter for therapeutic drug level monitoring: Secondary | ICD-10-CM

## 2021-10-04 DIAGNOSIS — R600 Localized edema: Secondary | ICD-10-CM | POA: Diagnosis not present

## 2021-10-04 DIAGNOSIS — Z86711 Personal history of pulmonary embolism: Secondary | ICD-10-CM

## 2021-10-04 DIAGNOSIS — Z7901 Long term (current) use of anticoagulants: Secondary | ICD-10-CM

## 2021-10-04 LAB — POCT INR: INR: 2 (ref 2.0–3.0)

## 2021-10-04 NOTE — Patient Instructions (Signed)
Description   Today take 1 tablet then continue to take 1/2 tablet daily except for 1 tablet on Tuesday, Thursday, and Saturday. Recheck INR 4 weeks. Coumadin Clinic [phone]

## 2021-11-01 ENCOUNTER — Other Ambulatory Visit: Payer: Self-pay

## 2021-11-01 ENCOUNTER — Ambulatory Visit (INDEPENDENT_AMBULATORY_CARE_PROVIDER_SITE_OTHER): Payer: Medicare Other | Admitting: *Deleted

## 2021-11-01 DIAGNOSIS — Z5181 Encounter for therapeutic drug level monitoring: Secondary | ICD-10-CM

## 2021-11-01 LAB — POCT INR: INR: 2.6 (ref 2.0–3.0)

## 2021-11-01 NOTE — Patient Instructions (Signed)
Description   Continue to take 1/2 tablet daily except for 1 tablet on Tuesday, Thursday, and Saturday. Recheck INR 5 weeks. Coumadin Clinic [phone]

## 2021-12-13 ENCOUNTER — Other Ambulatory Visit: Payer: Self-pay

## 2021-12-13 ENCOUNTER — Ambulatory Visit (INDEPENDENT_AMBULATORY_CARE_PROVIDER_SITE_OTHER): Payer: Commercial Managed Care - HMO | Admitting: *Deleted

## 2021-12-13 DIAGNOSIS — Z5181 Encounter for therapeutic drug level monitoring: Secondary | ICD-10-CM | POA: Diagnosis not present

## 2021-12-13 DIAGNOSIS — Z86711 Personal history of pulmonary embolism: Secondary | ICD-10-CM

## 2021-12-13 LAB — POCT INR: INR: 2.4 (ref 2.0–3.0)

## 2021-12-13 NOTE — Patient Instructions (Signed)
Description   Continue to take 1/2 tablet daily except for 1 tablet on Tuesday, Thursday, and Saturday. Recheck INR 6 weeks. Coumadin Clinic [phone]

## 2022-01-01 ENCOUNTER — Encounter: Payer: Self-pay | Admitting: Cardiovascular Disease

## 2022-01-01 ENCOUNTER — Other Ambulatory Visit: Payer: Self-pay

## 2022-01-01 ENCOUNTER — Ambulatory Visit (INDEPENDENT_AMBULATORY_CARE_PROVIDER_SITE_OTHER): Payer: Medicare Other | Admitting: Cardiovascular Disease

## 2022-01-01 VITALS — BP 130/70 | HR 65 | Ht 71.0 in

## 2022-01-01 DIAGNOSIS — R600 Localized edema: Secondary | ICD-10-CM

## 2022-01-01 DIAGNOSIS — Z86711 Personal history of pulmonary embolism: Secondary | ICD-10-CM

## 2022-01-01 DIAGNOSIS — Z7901 Long term (current) use of anticoagulants: Secondary | ICD-10-CM

## 2022-01-01 NOTE — Patient Instructions (Signed)
Medication Instructions:  Your physician recommends that you continue on your current medications as directed. Please refer to the Current Medication list given to you today.  *If you need a refill on your cardiac medications before your next appointment, please call your pharmacy*   Lab Work: None ordered   If you have labs (blood work) drawn today and your tests are completely normal, you will receive your results only by: [place] (if you have MyChart) OR A paper copy in the mail If you have any lab test that is abnormal or we need to change your treatment, we will call you to review the results.   Testing/Procedures: None Ordered   Follow-Up: At [facility], you and your health needs are our priority.  As part of our continuing mission to provide you with exceptional heart care, we have created designated Provider Care Teams.  These Care Teams include your primary Cardiologist (physician) and Advanced Practice Providers (APPs -  Physician Assistants and Nurse Practitioners) who all work together to provide you with the care you need, when you need it.  We recommend signing up for the patient portal called "MyChart".  Sign up information is provided on this After Visit Summary.  MyChart is used to connect with patients for Virtual Visits (Telemedicine).  Patients are able to view lab/test results, encounter notes, upcoming appointments, etc.  Non-urgent messages can be sent to your provider as well.   To learn more about what you can do with MyChart, go to [email or web address].    Your next appointment:   12 month(s)  The format for your next appointment:   In Person  Provider:   Lauree Chandler, MD    Other Instructions

## 2022-01-01 NOTE — Progress Notes (Signed)
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 He is seen once per year in our office since he is followed in our coumadin clinic. I saw him as a new patient 03/24/12 for evaluation of lower extremity edema. He was discharged from a rehab facility on 02/28/12 after spine surgery which was performed at [facility] in [place], [place] in January 2013. The pt reports total of 6 back surgeries for spinal stenosis ( spine disorder first diagnosed in 2004). He has a history of DVT in the summer 2012. He has history of PE in 2005, 2008, 2010. He has been on coumadin since summer 2012. He had an IVC filter placed in December 2012 when his blood thinner was stopped.  He had newly diagnosed HTN after the surgery along with fluid retention and lower extremity edema. He has chronic venous stasis disease. Echo in 2015 showed normal LV size and function with LVEF of 55-60% with mild LVH and mild dilation of the left atrium. He was last seen in our office November 2021 and was doing well.   He is here today for follow up. The patient denies any chest pain, dyspnea, palpitations, orthopnea, PND, dizziness, near syncope or syncope. Chronic lower extremity edema unchanged.   Primary Care Physician: Maximiano Coss, NP  Past Medical History:  Diagnosis Date   Arthritis    Cataract    Combined form OU   Clotting disorder ([place])    pulmonary embolus, DVT   Depression    Diabetes mellitus without complication ([place])    no meds, watching diet   DVT (deep venous thrombosis) ([place])    Summer 2012   Hypertension    Hypertensive retinopathy    OU   Neuromuscular disorder ([place])    spinal stenosis.- pt walks with a walker short distance   Peripheral vascular disease ([place])    PONV  (postoperative nausea and vomiting)    Pulmonary embolism ([place])    x 3 in 2005, 2008, 2010   Sleep apnea    does not wear a c-pap, pt. reports that he gave the CPAP back, denies that he has OSA    Spinal stenosis     Past Surgical History:  Procedure Laterality Date   ANTERIOR CERVICAL CORPECTOMY N/A 01/26/2019   Procedure: Cervical Four Corpectomy with Cervical Three-Four, Cervical Four-Five interbody fusion, prosthesis, explore old fusion, possible removal of old plate;  Surgeon: [name], MD;  Location: [place];  Service: Neurosurgery;  Laterality: N/A;  anterior approach   CHOLECYSTECTOMY N/A 05/24/2014   Procedure: LAPAROSCOPIC CHOLECYSTECTOMY;  Surgeon: Zenovia Jarred, MD;  Location: [place];  Service: General;  Laterality: N/A;   ERCP N/A 05/25/2014   Procedure: ENDOSCOPIC RETROGRADE CHOLANGIOPANCREATOGRAPHY (ERCP);  Surgeon: Beryle Beams, MD;  Location: [place];  Service: Endoscopy;  Laterality: N/A;   ERCP N/A 07/16/2014   Procedure: ENDOSCOPIC RETROGRADE CHOLANGIOPANCREATOGRAPHY (ERCP);  Surgeon: Beryle Beams, MD;  Location[facility];  Service: Endoscopy;  Laterality: N/A;   ivp filter  jan 2012   LUMBAR LAMINECTOMY/DECOMPRESSION MICRODISCECTOMY N/A 05/14/2019   Procedure: LAMINECTOMY AND FORAMINOTOMY THORACIC FIVE- THORACIC SIX, THORACIC ELEVEN- THORACIC THORACIC, LUMBAR THREE- LUMBAR FOUR;  Surgeon: [name], MD;  Location: [place];  Service: Neurosurgery;  Laterality: N/A;  posterior   LUMBAR LAMINECTOMY/DECOMPRESSION MICRODISCECTOMY N/A 11/11/2019   Procedure: REDO LUMBAR THREE- LUMBAR FOUR LAMINECTOMY/FORAMINOTOMY;  Surgeon: [name], MD;  Location: [place];  Service: Neurosurgery;  Laterality: N/A;   SPINE SURGERY  11/30/11   Total 6 back surgeries   SPINE SURGERY  38/1017   Dr. Cathren Laine in [place], [place]     08/2003, cervial spine 2005, 2010 lower back, 2011 lower back, 2012 & 2013   UPPER GASTROINTESTINAL ENDOSCOPY     WISDOM TOOTH  EXTRACTION     2 removed    Current Outpatient Medications  Medication Sig Dispense Refill   diazepam (VALIUM) 5 MG tablet      furosemide (LASIX) 40 MG tablet TAKE 1 TABLET EVERY DAY, NEED MD APPOINTMENT WITH CARDS FOR REFILLS 20 tablet 0   levothyroxine (SYNTHROID) 25 MCG tablet Take 1 tablet (25 mcg total) by mouth daily before breakfast. 90 tablet 2   losartan (COZAAR) 50 MG tablet TAKE 1 TABLET EVERY DAY 90 tablet 3   metoprolol succinate (TOPROL-XL) 50 MG 24 hr tablet TAKE 1 AND 1/2 TABLETS ONE TIME DAILY 135 tablet 2   Multiple Vitamin (MULTIVITAMIN WITH MINERALS) TABS tablet Take 1 tablet by mouth daily.      potassium chloride SA (KLOR-CON) 20 MEQ tablet TAKE 1 TABLET EVERY DAY, NEED MD APPOINTMENT WITH CARDS FOR REFILLS 90 tablet 2   warfarin (COUMADIN) 10 MG tablet TAKE BY MOUTH AS DIRECTED  BY COUMADIN CLINIC 70 tablet 0   No current facility-administered medications for this visit.    Allergies  Allergen Reactions   Ace Inhibitors Cough   Lisinopril Cough    Social History   Socioeconomic History   Marital status: Single    Spouse name: Not on file   Number of children: 3   Years of education: 10   Highest education level: 10th grade  Occupational History   Occupation: Disability  Tobacco Use   Smoking status: Former    Packs/day: 1.50    Years: 10.00    Pack years: 15.00    Types: Cigarettes    Quit date: 04/26/2001    Years since quitting: 20.6   Smokeless tobacco: Never  Vaping Use   Vaping Use: Never used  Substance and Sexual Activity   Alcohol use: Yes    Alcohol/week: 1.0 standard drink    Types: 1 Standard drinks or equivalent per week    Comment: very little- rare   Drug use: Yes    Types: Marijuana    Comment: daily- twice per day    Sexual activity: Yes  Other Topics Concern   Not on file  Social History Narrative   Patient is single and lives at home, his son lives with him.   Patient has three children.   Patient is disabled.    Patient has a 10 grade education.   Patient is right-handed.   Patient drinks 1 or 2 sodas and tea per week.   Social Determinants of Health   Financial Resource Strain: Not on file  Food Insecurity: Not on file  Transportation Needs: Not on file  Physical Activity: Not on file  Stress: Not on file  Social Connections: Not on file  Intimate Partner Violence: Not on file    Family History  Problem Relation Age of Onset   Cancer Mother 75       Lung   Diabetes Father    Prostate cancer Father    Hypertension Father  Hypertension Brother    Hypertension Brother    Diabetes Brother    Heart attack Neg Hx    Stroke Neg Hx    Colon cancer Neg Hx    Esophageal cancer Neg Hx    Pancreatic cancer Neg Hx    Rectal cancer Neg Hx    Stomach cancer Neg Hx     Review of Systems:  As stated in the HPI and otherwise negative.   BP 130/70    Pulse 65    Ht 5\' 11"  (1.803 m)    SpO2 95%    BMI 35.37 kg/m   Physical Examination: General: Well developed, well nourished, NAD  HEENT: OP clear, mucus membranes moist  SKIN: warm, dry. No rashes. Neuro: No focal deficits  Musculoskeletal: Muscle strength 5/5 all ext  Psychiatric: Mood and affect normal  Neck: No JVD, no carotid bruits, no thyromegaly, no lymphadenopathy.  Lungs:Clear bilaterally, no wheezes, rhonci, crackles Cardiovascular: Regular rate and rhythm. No murmurs, gallops or rubs. Abdomen:Soft. Bowel sounds present. Non-tender.  Extremities: No lower extremity edema. Pulses are 2 + in the bilateral DP/PT.  Echo June 2015: Left ventricle: The cavity size was normal. Wall thickness was    normal. Systolic function was normal. The estimated ejection    fraction was in the range of 55% to 60%. Features are consistent    with a pseudonormal left ventricular filling pattern, with    concomitant abnormal relaxation and increased filling pressure    (grade 2 diastolic dysfunction).  - Impressions: Technically limited study due  to poor sound wave    transmission  EKG:  EKG is ordered today. The ekg ordered today demonstrates sinus  Recent Labs: No results found for requested labs within last 8760 hours.   Lipid Panel    Component Value Date/Time   CHOL 155 03/18/2020 1602   TRIG 151 (H) 03/18/2020 1602   HDL 42 03/18/2020 1602   CHOLHDL 3.7 03/18/2020 1602   CHOLHDL 3.4 10/04/2016 0946   VLDL 23 10/04/2016 0946   LDLCALC 87 03/18/2020 1602   LDLDIRECT 82 05/01/2013 1545     Wt Readings from Last 3 Encounters:  10/12/20 253 lb 9.6 oz (115 kg)  04/05/20 269 lb (122 kg)  03/18/20 259 lb (117.5 kg)     Other studies Reviewed: Additional studies/ records that were reviewed today include: . Review of the above records demonstrates:   Assessment and Plan:   1. History of pulmonary embolism/chronic anti-coagulation management: He will need lifelong anti-coagulation. He is followed in our coumadin clinic. I see him once yearly since he is followed in our coumadin clinic. Will continue coumadin  2. Lower extremity edema: Likely secondary to venous stasis disease and chronic DVT. Will continue coumadin for lifetime given his history of DVT, PE and immobility. He has an IVC filter in place. I have nothing to offer and I suspect his venous damage from his recurrent DVT is irreversible. Continue Lasix  Current medicines are reviewed at length with the patient today.  The patient does not have concerns regarding medicines.  The following changes have been made:  no change  Labs/ tests ordered today include:   Orders Placed This Encounter  Procedures   EKG 12-Lead   Disposition:   F/U with me in 12  months  Signed, Lauree Chandler, MD 01/01/2022 4:52 PM    [facility] [place], [place],   [address] Phone: [phone]; Fax: [phone]

## 2022-01-12 ENCOUNTER — Other Ambulatory Visit: Payer: Self-pay | Admitting: Cardiology

## 2022-01-24 ENCOUNTER — Ambulatory Visit (INDEPENDENT_AMBULATORY_CARE_PROVIDER_SITE_OTHER): Payer: Medicare Other | Admitting: *Deleted

## 2022-01-24 ENCOUNTER — Other Ambulatory Visit: Payer: Self-pay

## 2022-01-24 DIAGNOSIS — Z5181 Encounter for therapeutic drug level monitoring: Secondary | ICD-10-CM

## 2022-01-24 DIAGNOSIS — Z86711 Personal history of pulmonary embolism: Secondary | ICD-10-CM

## 2022-01-24 LAB — POCT INR: INR: 2 (ref 2.0–3.0)

## 2022-01-24 NOTE — Patient Instructions (Signed)
Description   ?Continue to take 1/2 tablet daily except for 1 tablet on Tuesday, Thursday, and Saturday. Recheck INR 6 weeks. Coumadin Clinic [phone] ?  ? ? ?

## 2022-03-14 ENCOUNTER — Ambulatory Visit (INDEPENDENT_AMBULATORY_CARE_PROVIDER_SITE_OTHER): Payer: Medicare Other | Admitting: *Deleted

## 2022-03-14 DIAGNOSIS — Z7901 Long term (current) use of anticoagulants: Secondary | ICD-10-CM | POA: Diagnosis not present

## 2022-03-14 DIAGNOSIS — Z86711 Personal history of pulmonary embolism: Secondary | ICD-10-CM | POA: Diagnosis not present

## 2022-03-14 DIAGNOSIS — R600 Localized edema: Secondary | ICD-10-CM

## 2022-03-14 LAB — POCT INR: INR: 2.8 (ref 2.0–3.0)

## 2022-03-14 NOTE — Patient Instructions (Signed)
Description   ?Continue taking 1/2 tablet daily except for 1 tablet on Tuesday, Thursday, and Saturday. Recheck INR 6 weeks. Coumadin Clinic [phone] ?  ?  ?

## 2022-03-17 ENCOUNTER — Other Ambulatory Visit: Payer: Self-pay | Admitting: Cardiovascular Disease

## 2022-03-17 DIAGNOSIS — Z7901 Long term (current) use of anticoagulants: Secondary | ICD-10-CM

## 2022-03-19 MED ORDER — WARFARIN SODIUM 10 MG PO TABS: ORAL_TABLET | ORAL | 0 refills | Status: DC

## 2022-03-20 ENCOUNTER — Other Ambulatory Visit: Payer: Self-pay | Admitting: Cardiology

## 2022-03-20 DIAGNOSIS — E039 Hypothyroidism, unspecified: Secondary | ICD-10-CM

## 2022-03-20 NOTE — Telephone Encounter (Signed)
Pt's pharmacy is requesting a refill on levothyroxine. Would Dr. Quentin Ore like to refill this medication? Please address ?

## 2022-04-25 ENCOUNTER — Ambulatory Visit (INDEPENDENT_AMBULATORY_CARE_PROVIDER_SITE_OTHER): Payer: Medicare Other

## 2022-04-25 DIAGNOSIS — Z86711 Personal history of pulmonary embolism: Secondary | ICD-10-CM

## 2022-04-25 DIAGNOSIS — Z5181 Encounter for therapeutic drug level monitoring: Secondary | ICD-10-CM

## 2022-04-25 LAB — POCT INR: INR: 2.6 (ref 2.0–3.0)

## 2022-04-25 NOTE — Patient Instructions (Signed)
Description   Continue taking 1/2 tablet daily except for 1 tablet on Tuesday, Thursday, and Saturday. Recheck INR 6 weeks. Coumadin Clinic [phone]

## 2022-05-20 ENCOUNTER — Other Ambulatory Visit: Payer: Self-pay | Admitting: Cardiovascular Disease

## 2022-05-20 DIAGNOSIS — Z7901 Long term (current) use of anticoagulants: Secondary | ICD-10-CM

## 2022-06-06 ENCOUNTER — Ambulatory Visit (INDEPENDENT_AMBULATORY_CARE_PROVIDER_SITE_OTHER): Payer: Medicare Other | Admitting: *Deleted

## 2022-06-06 DIAGNOSIS — Z7901 Long term (current) use of anticoagulants: Secondary | ICD-10-CM | POA: Diagnosis not present

## 2022-06-06 DIAGNOSIS — Z86711 Personal history of pulmonary embolism: Secondary | ICD-10-CM

## 2022-06-06 DIAGNOSIS — R600 Localized edema: Secondary | ICD-10-CM

## 2022-06-06 LAB — POCT INR: INR: 2.5 (ref 2.0–3.0)

## 2022-06-06 NOTE — Patient Instructions (Signed)
Description   Continue taking 1/2 tablet daily except for 1 tablet on Tuesday, Thursday, and Saturday. Recheck INR 6 weeks. Coumadin Clinic [phone] or [phone]

## 2022-07-09 ENCOUNTER — Other Ambulatory Visit: Payer: Self-pay

## 2022-07-09 DIAGNOSIS — Z7901 Long term (current) use of anticoagulants: Secondary | ICD-10-CM

## 2022-07-09 MED ORDER — WARFARIN SODIUM 10 MG PO TABS: ORAL_TABLET | ORAL | 0 refills | Status: DC

## 2022-07-09 NOTE — Telephone Encounter (Signed)
Pt called and stated he has misplaced his bottle of Warfarin. Requested a 30 day supply be sent to [facility] until new Mail order can be sent.   Prescription refill request received for warfarin Lov: 01/01/22 Next INR check: 07/18/22 Warfarin tablet strength: '10mg'$   Appropriate dose and refill sent to requested pharmacy.

## 2022-07-25 ENCOUNTER — Ambulatory Visit: Payer: Medicare Other | Attending: Cardiovascular Disease

## 2022-07-25 ENCOUNTER — Other Ambulatory Visit: Payer: Self-pay

## 2022-07-25 DIAGNOSIS — Z7901 Long term (current) use of anticoagulants: Secondary | ICD-10-CM | POA: Diagnosis not present

## 2022-07-25 DIAGNOSIS — Z86711 Personal history of pulmonary embolism: Secondary | ICD-10-CM | POA: Diagnosis not present

## 2022-07-25 LAB — POCT INR: INR: 3.1 — AB (ref 2.0–3.0)

## 2022-07-25 MED ORDER — WARFARIN SODIUM 10 MG PO TABS: ORAL_TABLET | ORAL | 0 refills | Status: DC

## 2022-07-25 NOTE — Telephone Encounter (Signed)
Prescription refill request received for warfarin Lov: 01/01/22 Angelena Form)  Next INR check: 09/05/22 Warfarin tablet strength: '10mg'$   Appropriate dose and refill sent to requested pharmacy.

## 2022-07-25 NOTE — Patient Instructions (Addendum)
Description   Eat a serving of greens today and continue taking 1/2 tablet daily except for 1 tablet on Tuesday, Thursday, and Saturday. Recheck INR 6 weeks. Coumadin Clinic [phone] or [phone]

## 2022-08-25 ENCOUNTER — Other Ambulatory Visit: Payer: Self-pay | Admitting: Pharmacist Clinician (PhC)/ Clinical Pharmacy Specialist

## 2022-08-25 DIAGNOSIS — Z7901 Long term (current) use of anticoagulants: Secondary | ICD-10-CM

## 2022-08-25 MED ORDER — WARFARIN SODIUM 10 MG PO TABS: ORAL_TABLET | ORAL | 0 refills | Status: DC

## 2022-09-01 ENCOUNTER — Other Ambulatory Visit: Payer: Self-pay | Admitting: Cardiovascular Disease

## 2022-09-01 DIAGNOSIS — Z7901 Long term (current) use of anticoagulants: Secondary | ICD-10-CM

## 2022-09-05 ENCOUNTER — Ambulatory Visit: Payer: Medicare Other | Attending: Cardiology

## 2022-09-05 DIAGNOSIS — R600 Localized edema: Secondary | ICD-10-CM

## 2022-09-05 DIAGNOSIS — Z7901 Long term (current) use of anticoagulants: Secondary | ICD-10-CM

## 2022-09-05 DIAGNOSIS — Z86711 Personal history of pulmonary embolism: Secondary | ICD-10-CM | POA: Diagnosis not present

## 2022-09-05 LAB — POCT INR: INR: 1.8 — AB (ref 2.0–3.0)

## 2022-09-05 NOTE — Patient Instructions (Signed)
Description   Take an extra 1/2 tablet today, then resume same dosage of Warfarin 1/2 tablet daily except for 1 tablet on Tuesday, Thursday, and Saturday. Recheck INR 4 weeks. Coumadin Clinic [phone] or [phone]

## 2022-10-03 ENCOUNTER — Ambulatory Visit: Payer: Medicare Other | Attending: Cardiovascular Disease | Admitting: *Deleted

## 2022-10-03 DIAGNOSIS — Z7901 Long term (current) use of anticoagulants: Secondary | ICD-10-CM | POA: Diagnosis not present

## 2022-10-03 DIAGNOSIS — Z86711 Personal history of pulmonary embolism: Secondary | ICD-10-CM | POA: Diagnosis not present

## 2022-10-03 DIAGNOSIS — R600 Localized edema: Secondary | ICD-10-CM | POA: Diagnosis not present

## 2022-10-03 LAB — POCT INR: INR: 3.6 — AB (ref 2.0–3.0)

## 2022-10-03 NOTE — Patient Instructions (Signed)
Description   Do not take any warfarin tomorrow then continue taking warfarin 1/2 tablet daily except for 1 tablet on Tuesday, Thursday, and Saturday. Recheck INR 4 weeks. Coumadin Clinic [phone] or [phone]

## 2022-10-24 ENCOUNTER — Other Ambulatory Visit: Payer: Self-pay | Admitting: Cardiovascular Disease

## 2022-10-25 ENCOUNTER — Other Ambulatory Visit: Payer: Self-pay | Admitting: Cardiovascular Disease

## 2022-10-25 NOTE — Telephone Encounter (Signed)
Rx refill sent to pharmacy. 

## 2022-10-31 ENCOUNTER — Ambulatory Visit: Payer: Medicare Other

## 2022-11-06 ENCOUNTER — Ambulatory Visit: Payer: Medicare Other | Attending: Cardiology

## 2022-11-06 DIAGNOSIS — Z86711 Personal history of pulmonary embolism: Secondary | ICD-10-CM

## 2022-11-06 DIAGNOSIS — R600 Localized edema: Secondary | ICD-10-CM

## 2022-11-06 DIAGNOSIS — Z7901 Long term (current) use of anticoagulants: Secondary | ICD-10-CM

## 2022-11-06 DIAGNOSIS — Z5181 Encounter for therapeutic drug level monitoring: Secondary | ICD-10-CM

## 2022-11-06 LAB — POCT INR: INR: 2.1 (ref 2.0–3.0)

## 2022-11-06 NOTE — Patient Instructions (Signed)
continue taking warfarin 1/2 tablet daily except for 1 tablet on Tuesday, Thursday, and Saturday. Recheck INR 6 weeks. Coumadin Clinic [phone] or [phone]

## 2022-12-25 ENCOUNTER — Ambulatory Visit: Payer: Medicare (Managed Care)

## 2023-01-01 ENCOUNTER — Ambulatory Visit: Payer: Medicare (Managed Care) | Attending: Internal Medicine

## 2023-01-01 DIAGNOSIS — Z86711 Personal history of pulmonary embolism: Secondary | ICD-10-CM | POA: Diagnosis not present

## 2023-01-01 DIAGNOSIS — Z7901 Long term (current) use of anticoagulants: Secondary | ICD-10-CM

## 2023-01-01 DIAGNOSIS — R600 Localized edema: Secondary | ICD-10-CM | POA: Diagnosis not present

## 2023-01-01 DIAGNOSIS — Z5181 Encounter for therapeutic drug level monitoring: Secondary | ICD-10-CM

## 2023-01-01 LAB — POCT INR: INR: 2.2 (ref 2.0–3.0)

## 2023-01-01 NOTE — Patient Instructions (Signed)
continue taking warfarin 1/2 tablet daily except for 1 tablet on Tuesday, Thursday, and Saturday. Recheck INR 6 weeks. Coumadin Clinic [phone] or [phone]

## 2023-01-18 ENCOUNTER — Encounter: Payer: Self-pay | Admitting: *Deleted

## 2023-02-12 ENCOUNTER — Encounter: Payer: Self-pay | Admitting: Internal Medicine

## 2023-02-12 ENCOUNTER — Telehealth: Payer: Self-pay

## 2023-02-12 ENCOUNTER — Ambulatory Visit (INDEPENDENT_AMBULATORY_CARE_PROVIDER_SITE_OTHER): Payer: Medicare (Managed Care) | Admitting: Internal Medicine

## 2023-02-12 VITALS — BP 140/80 | HR 68 | Ht 71.0 in | Wt 235.0 lb

## 2023-02-12 DIAGNOSIS — Z7901 Long term (current) use of anticoagulants: Secondary | ICD-10-CM

## 2023-02-12 DIAGNOSIS — Z86718 Personal history of other venous thrombosis and embolism: Secondary | ICD-10-CM

## 2023-02-12 DIAGNOSIS — Z86010 Personal history of colonic polyps: Secondary | ICD-10-CM

## 2023-02-12 DIAGNOSIS — R1319 Other dysphagia: Secondary | ICD-10-CM | POA: Diagnosis not present

## 2023-02-12 MED ORDER — NA SULFATE-K SULFATE-MG SULF 17.5-3.13-1.6 GM/177ML PO SOLN: 1.0000 | Freq: Once | ORAL | 0 refills | Status: AC

## 2023-02-12 NOTE — Telephone Encounter (Signed)
Patient with diagnosis of recurrent DVT/PE on warfarin for anticoagulation.    Procedure:endo/colonoscopy Date of procedure: 03/25/23  Patient needs an updated CBC and BMP. He has apt with coumadin clinic tomorrow where these can be done.  Per office protocol, patient can hold warfarin for 5 days prior to procedure.    Patient WILL need bridging with Lovenox (enoxaparin) around procedure.  **This guidance is not considered finalized until pre-operative APP has relayed final recommendations.**

## 2023-02-12 NOTE — Patient Instructions (Signed)
You have been scheduled for an endoscopy and colonoscopy. Please follow the written instructions given to you at your visit today. Please pick up your prep supplies at the pharmacy within the next 1-3 days. If you use inhalers (even only as needed), please bring them with you on the day of your procedure.  _______________________________________________________  If your blood pressure at your visit was 140/90 or greater, please contact your primary care physician to follow up on this.  _______________________________________________________  If you are age 32 or older, your body mass index should be between 23-30. Your Body mass index is 32.78 kg/m. If this is out of the aforementioned range listed, please consider follow up with your Primary Care Provider.  If you are age 64 or younger, your body mass index should be between 19-25. Your Body mass index is 32.78 kg/m. If this is out of the aformentioned range listed, please consider follow up with your Primary Care Provider.   ________________________________________________________  The [facility] providers would like to encourage you to use [facility] to communicate with providers for non-urgent requests or questions.  Due to long hold times on the telephone, sending your provider a message by [facility] may be a faster and more efficient way to get a response.  Please allow 48 business hours for a response.  Please remember that this is for non-urgent requests.  _______________________________________________________

## 2023-02-12 NOTE — Progress Notes (Signed)
Patient ID: Nicolas [name], male   DOB: July 29, 1966, 57 y.o.   MRN: [id number] HPI: Nicolas [name] is a 57 year old male with a history of adenomatous colon polyps, DVT/PE on chronic warfarin therapy, diabetes, hypertension, spinal stenosis, sleep apnea who is seen to discuss surveillance colonoscopy but also solid food dysphagia.  He is here today with his son.  He reports that he notices food which feels like it sticks in the esophagus with swallowing.  Intermittent and not occurring with every meal.  He also has belching after eating.  No true heartburn symptom.  Does feel like he has to push food down with liquid.  No nausea or vomiting.  No odynophagia.  No prior upper endoscopy.  He is not taking antacid therapy.  No weight loss unexpectedly.  Lower GI without complaint.  Regular bowel movements.  No blood in stool or melena.  Has a bowel movement about 3 days a week.  He denies frequent issues with diarrhea or constipation.  Past Medical History:  Diagnosis Date   Arthritis    Cataract    Combined form OU   Clotting disorder ([place])    pulmonary embolus, DVT   Depression    Diabetes mellitus without complication ([place])    no meds, watching diet   DVT (deep venous thrombosis) ([place])    Summer 2012   Hypertension    Hypertensive retinopathy    OU   Hypoxemia 05/23/2014   Neuromuscular disorder ([place])    spinal stenosis.- pt walks with a walker short distance   Peripheral vascular disease ([place])    PONV (postoperative nausea and vomiting)    Pulmonary embolism ([place])    x 3 in 2005, 2008, 2010   Sleep apnea    does not wear a c-pap, pt. reports that he gave the CPAP back, denies that he has OSA    Spinal stenosis    Tubular adenoma of colon     Past Surgical History:  Procedure Laterality Date   ANTERIOR CERVICAL CORPECTOMY N/A 01/26/2019   Procedure: Cervical Four Corpectomy with Cervical Three-Four, Cervical Four-Five interbody fusion, prosthesis, explore old fusion, possible removal  of old plate;  Surgeon: [name], MD;  Location: [facility];  Service: Neurosurgery;  Laterality: N/A;  anterior approach   CHOLECYSTECTOMY N/A 05/24/2014   Procedure: LAPAROSCOPIC CHOLECYSTECTOMY;  Surgeon: Zenovia Jarred, MD;  Location: [facility];  Service: General;  Laterality: N/A;   ERCP N/A 05/25/2014   Procedure: ENDOSCOPIC RETROGRADE CHOLANGIOPANCREATOGRAPHY (ERCP);  Surgeon: Beryle Beams, MD;  Location: [facility];  Service: Endoscopy;  Laterality: N/A;   ERCP N/A 07/16/2014   Procedure: ENDOSCOPIC RETROGRADE CHOLANGIOPANCREATOGRAPHY (ERCP);  Surgeon: Beryle Beams, MD;  [facility];  Service: Endoscopy;  Laterality: N/A;   ivp filter  jan 2012   LUMBAR LAMINECTOMY/DECOMPRESSION MICRODISCECTOMY N/A 05/14/2019   Procedure: LAMINECTOMY AND FORAMINOTOMY THORACIC FIVE- THORACIC SIX, THORACIC ELEVEN- THORACIC THORACIC, LUMBAR THREE- LUMBAR FOUR;  Surgeon: [name], MD;  Location: [facility];  Service: Neurosurgery;  Laterality: N/A;  posterior   LUMBAR LAMINECTOMY/DECOMPRESSION MICRODISCECTOMY N/A 11/11/2019   Procedure: REDO LUMBAR THREE- LUMBAR FOUR LAMINECTOMY/FORAMINOTOMY;  Surgeon: [name], MD;  Location: [facility];  Service: Neurosurgery;  Laterality: N/A;   SPINE SURGERY  11/30/11   Total 6 back surgeries   SPINE SURGERY  [id number]   Dr. Cathren Laine in [place], [place]     08/2003, cervial spine 2005, 2010 lower back, 2011 lower back, 2012 & 2013   UPPER  GASTROINTESTINAL ENDOSCOPY     WISDOM TOOTH EXTRACTION     2 removed    Outpatient Medications Prior to Visit  Medication Sig Dispense Refill   furosemide (LASIX) 40 MG tablet TAKE 1 TABLET EVERY DAY, NEED MD APPOINTMENT WITH CARDS FOR REFILLS 20 tablet 0   levothyroxine (SYNTHROID) 25 MCG tablet Take 1 tablet (25 mcg total) by mouth daily before breakfast. 90 tablet 2   losartan (COZAAR) 50 MG tablet TAKE 1 TABLET EVERY DAY 90 tablet 3   metoprolol succinate (TOPROL-XL) 50 MG 24 hr tablet TAKE 1 AND  1/2 TABLETS BY MOUTH  ONCE DAILY 135 tablet 1   Multiple Vitamin (MULTIVITAMIN WITH MINERALS) TABS tablet Take 1 tablet by mouth daily.      potassium chloride SA (KLOR-CON M) 20 MEQ tablet TAKE 1 TABLET BY MOUTH  DAILY 100 tablet 1   warfarin (COUMADIN) 10 MG tablet TAKE 1/2 TO 1 TABLET BY MOUTH  DAILY AS DIRECTED BY COUMADIN  CLINIC 60 tablet 5   diazepam (VALIUM) 5 MG tablet      No facility-administered medications prior to visit.    Allergies  Allergen Reactions   Ace Inhibitors Cough   Lisinopril Cough    Family History  Problem Relation Age of Onset   Cancer Mother 70       Lung   Diabetes Father    Prostate cancer Father    Hypertension Father    Hypertension Brother    Hypertension Brother    Diabetes Brother    Heart attack Neg Hx    Stroke Neg Hx    Colon cancer Neg Hx    Esophageal cancer Neg Hx    Pancreatic cancer Neg Hx    Rectal cancer Neg Hx    Stomach cancer Neg Hx     Social History   Tobacco Use   Smoking status: Former    Packs/day: 1.50    Years: 10.00    Additional pack years: 0.00    Total pack years: 15.00    Types: Cigarettes    Quit date: 04/26/2001    Years since quitting: 21.8   Smokeless tobacco: Never  Vaping Use   Vaping Use: Never used  Substance Use Topics   Alcohol use: Yes    Alcohol/week: 1.0 standard drink of alcohol    Types: 1 Standard drinks or equivalent per week    Comment: very little- rare   Drug use: Yes    Types: Marijuana    Comment: daily- twice per day     ROS: As per history of present illness, otherwise negative  BP (!) 140/80   Pulse 68   Ht 5\' 11"  (1.803 m)   Wt 235 lb (106.6 kg)   BMI 32.78 kg/m  Gen: awake, alert, NAD HEENT: anicteric  Abd: soft, NT/ND, +BS throughout Ext: no c/c/e Neuro: nonfocal     ASSESSMENT/PLAN: 57 year old male with a history of adenomatous colon polyps, DVT/PE on chronic warfarin therapy, diabetes, hypertension, spinal stenosis, sleep apnea who is seen to discuss  surveillance colonoscopy but also solid food dysphagia.   Esophageal dysphagia --without significant heartburn.  Upper endoscopy recommended. -- EGD in the [place], see #3  2.  History of adenomatous colon polyps --surveillance colonoscopy recommended.  We reviewed the risk, benefits and alternatives to upper and lower endoscopy and he is agreeable and wishes to proceed -- Colonoscopy in the [place], see #3  3.  Chronic anticoagulation/history of DVT PE -- Will hold warfarin 5 days  prior to endoscopic procedures - will instruct when and how to resume after procedure. Benefits and risks of procedure explained including risks of bleeding, perforation, infection, missed lesions, reactions to medications and possible need for hospitalization and surgery for complications. Additional rare but real risk of stroke or other vascular clotting events off warfarin and also explained and need to seek urgent help if any signs of these problems occur. Will communicate by phone or EMR with patient's  prescribing provider to confirm that holding warfarin is reasonable in this case.  -- Warfarin hold and Lovenox bridge if felt necessary per managing providers at heart care/Coumadin clinic

## 2023-02-12 NOTE — Telephone Encounter (Signed)
Coumadin Clinic aware and note has been placed on Coumadin Clinic appt tomorrow to confirm pt has labs drawn and pt receives Lovenox bridge instructions.

## 2023-02-12 NOTE — Telephone Encounter (Signed)
   Name: Nicolas [name]  DOB: March 22, 1966  MRN: [id number]  Primary Cardiologist: Lauree Chandler, MD  Chart reviewed as part of pre-operative protocol coverage. Because of Gabreil Kilner Dowda's past medical history and time since last visit, he will require a follow-up in-office visit in order to better assess preoperative cardiovascular risk.  Pre-op covering staff: - Please schedule appointment and call patient to inform them. If patient already had an upcoming appointment within acceptable timeframe, please add "pre-op clearance" to the appointment notes so provider is aware. - Please contact requesting surgeon's office via preferred method (i.e, phone, fax) to inform them of need for appointment prior to surgery.  This message will also be routed to pharmacy pool for input on holding Coumadin as requested below so that this information is available to the clearing provider at time of patient's appointment.   Mayra Reel, NP  02/12/2023, 5:08 PM

## 2023-02-12 NOTE — Telephone Encounter (Signed)
Pt has been scheduled for in office appt with Ermalinda Barrios, [facility] 02/20/23 @ 12:15. Pt thanked me for the help. I will update all parties involved.

## 2023-02-12 NOTE — Telephone Encounter (Signed)
[facility] Pre-operative Risk Assessment     Request for surgical clearance:     Endoscopy Procedure  What type of surgery is being performed?     Endo/colon  When is this surgery scheduled?     03/25/23  What type of clearance is required ?   Pharmacy  Are there any medications that need to be held prior to surgery and how long? Coumadin 5 days  Practice name and name of physician performing surgery?      [facility]  What is your office phone and fax number?      Phone- [phone]  [phone]  Anesthesia type (None, local, MAC, general) ?       MAC

## 2023-02-13 ENCOUNTER — Ambulatory Visit: Payer: Medicare (Managed Care) | Attending: Cardiovascular Disease | Admitting: *Deleted

## 2023-02-13 DIAGNOSIS — Z86711 Personal history of pulmonary embolism: Secondary | ICD-10-CM

## 2023-02-13 DIAGNOSIS — Z7901 Long term (current) use of anticoagulants: Secondary | ICD-10-CM | POA: Diagnosis not present

## 2023-02-13 DIAGNOSIS — R600 Localized edema: Secondary | ICD-10-CM

## 2023-02-13 LAB — POCT INR: INR: 2.3 (ref 2.0–3.0)

## 2023-02-13 NOTE — Patient Instructions (Addendum)
Description   Continue taking warfarin 1/2 tablet daily except for 1 tablet on Tuesday, Thursday, and Saturday. Recheck INR 4 weeks (will need bridge for upcoming procedure on 03/25/23). Coumadin Clinic [phone] or [phone]

## 2023-02-14 LAB — BASIC METABOLIC PANEL
BUN/Creatinine Ratio: 16 (ref 9–20)
BUN: 11 mg/dL (ref 6–24)
CO2: 26 mmol/L (ref 20–29)
Calcium: 9.2 mg/dL (ref 8.7–10.2)
Chloride: 104 mmol/L (ref 96–106)
Creatinine, Ser: 0.69 mg/dL — ABNORMAL LOW (ref 0.76–1.27)
Glucose: 238 mg/dL — ABNORMAL HIGH (ref 70–99)
Potassium: 4.5 mmol/L (ref 3.5–5.2)
Sodium: 141 mmol/L (ref 134–144)
eGFR: 109 mL/min/{1.73_m2} (ref >59)

## 2023-02-14 LAB — CBC
Hematocrit: 44.1 % (ref 37.5–51.0)
Hemoglobin: 14.3 g/dL (ref 13.0–17.7)
MCH: 29.8 pg (ref 26.6–33.0)
MCHC: 32.4 g/dL (ref 31.5–35.7)
MCV: 92 fL (ref 79–97)
Platelets: 155 10*3/uL (ref 150–450)
RBC: 4.8 x10E6/uL (ref 4.14–5.80)
RDW: 13.1 % (ref 11.6–15.4)
WBC: 7.4 10*3/uL (ref 3.4–10.8)

## 2023-02-14 NOTE — Progress Notes (Signed)
Cardiology Office Note:    Date:  02/20/2023   ID:  [name], DOB [birth year], MRN [id number]  PCP:  Roselee Nova, MD  [facility] Cardiologist:  Lauree Chandler, MD     Referring MD: Roselee Nova, MD   Chief Complaint:  Pre-op Exam     History of Present Illness:   Nicolas [name] is a 57 y.o. 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 The pt reports total of 6 back surgeries for spinal stenosis ( spine disorder first diagnosed in 2004). He has a history of DVT in the summer 2012. He has history of PE in 2005, 2008, 2010. He has been on coumadin since summer 2012. He had an IVC filter placed in December 2012 when his blood thinner was stopped. He had newly diagnosed HTN after the surgery along with fluid retention and lower extremity edema. He has chronic venous stasis disease. Echo in 2015 showed normal LV size and function with LVEF of 55-60% with mild LVH and mild dilation of the left atrium     Patient on my schedule for clearance for endo/colon 03/25/23  by Dr. Hilarie Fredrickson and will need lovenox bridge. Patient denies chest pain, dyspnea, palpitations, dizziness, edema. In a wheelchair >95% time. Doesn't smoke. Can walk 30-40 feet with a walker.     Past Medical History:  Diagnosis Date   Arthritis    Cataract    Combined form OU   Clotting disorder ([place])    pulmonary embolus, DVT   Depression    Diabetes mellitus without complication ([place])    no meds, watching diet   DVT (deep venous thrombosis) ([place])    Summer 2012   Hypertension    Hypertensive retinopathy    OU   Hypoxemia 05/23/2014   Neuromuscular disorder ([place])    spinal stenosis.- pt walks with a walker short distance   Peripheral vascular disease ([place])    PONV (postoperative nausea and vomiting)    Pulmonary embolism ([place])    x 3 in 2005, 2008, 2010   Sleep apnea    does not  wear a c-pap, pt. reports that he gave the CPAP back, denies that he has OSA    Spinal stenosis    Tubular adenoma of colon    Current Medications: Current Meds  Medication Sig   furosemide (LASIX) 40 MG tablet TAKE 1 TABLET EVERY DAY, NEED MD APPOINTMENT WITH CARDS FOR REFILLS   levothyroxine (SYNTHROID) 25 MCG tablet Take 1 tablet (25 mcg total) by mouth daily before breakfast.   losartan (COZAAR) 50 MG tablet TAKE 1 TABLET EVERY DAY   metoprolol succinate (TOPROL-XL) 50 MG 24 hr tablet TAKE 1 AND 1/2 TABLETS BY MOUTH  ONCE DAILY   Multiple Vitamin (MULTIVITAMIN WITH MINERALS) TABS tablet Take 1 tablet by mouth daily.    potassium chloride SA (KLOR-CON M) 20 MEQ tablet TAKE 1 TABLET BY MOUTH  DAILY   warfarin (COUMADIN) 10 MG tablet TAKE 1/2 TO 1 TABLET BY MOUTH  DAILY AS DIRECTED BY COUMADIN  CLINIC    Allergies:   Ace inhibitors and Lisinopril   Social History   Tobacco Use   Smoking status: Former    Packs/day: 1.50    Years: 10.00    Additional pack years: 0.00    Total pack years: 15.00    Types: Cigarettes    Quit date:  04/26/2001    Years since quitting: 21.8   Smokeless tobacco: Never  Vaping Use   Vaping Use: Never used  Substance Use Topics   Alcohol use: Yes    Alcohol/week: 1.0 standard drink of alcohol    Types: 1 Standard drinks or equivalent per week    Comment: very little- rare   Drug use: Yes    Types: Marijuana    Comment: daily- twice per day     Family Hx: The patient's family history includes Congestive Heart Failure in his father; Diabetes in his brother and father; Hypertension in his brother, brother, and father; Kidney disease in his father; Lung cancer (age of onset: 26) in his mother; Prostate cancer in his father. There is no history of Heart attack, Stroke, Colon cancer, Esophageal cancer, Pancreatic cancer, Rectal cancer, or Stomach cancer.  ROS     Physical Exam:    VS:  BP 132/78   Pulse 64   Ht 5\' 10"  (1.778 m)   Wt 258 lb 3.2 oz  (117.1 kg)   SpO2 99%   BMI 37.05 kg/m     Wt Readings from Last 3 Encounters:  02/20/23 258 lb 3.2 oz (117.1 kg)  02/12/23 235 lb (106.6 kg)  10/12/20 253 lb 9.6 oz (115 kg)    Physical Exam  GEN: obese,  in no acute distress  Neck: no JVD, carotid bruits, or masses Cardiac:RRR; no murmurs, rubs, or gallops  Respiratory:  clear to auscultation bilaterally, normal work of breathing GI: soft, nontender, nondistended, + BS Ext: trace edema otherwise without cyanosis, clubbing, or edema, Good distal pulses bilaterally Neuro:  Alert and Oriented x 3,  Psych: euthymic mood, full affect        EKGs/Labs/Other Test Reviewed:    EKG:  EKG is   ordered today.  The ekg ordered today demonstrates NSR, normal EKG  Recent Labs: 02/13/2023: BUN 11; Creatinine, Ser 0.69; Hemoglobin 14.3; Platelets 155; Potassium 4.5; Sodium 141   Recent Lipid Panel No results for input(s): "CHOL", "TRIG", "HDL", "VLDL", "LDLCALC", "LDLDIRECT" in the last 8760 hours.   Prior CV Studies:     Echocardiogram 05/23/2014 Study Conclusions - Left ventricle: The cavity size was normal. Wall thickness was   normal. Systolic function was normal. The estimated ejection   fraction was in the range of 55% to 60%. Features are consistent   with a pseudonormal left ventricular filling pattern, with   concomitant abnormal relaxation and increased filling pressure   (grade 2 diastolic dysfunction). - Impressions: Technically limited study due to poor sound wave   transmission.  Impressions: - Technically limited study due to poor sound wave transmission.     Risk Assessment/Calculations/Metrics:              ASSESSMENT & PLAN:   No problem-specific Assessment & Plan notes found for this encounter.   Preop clearance for colonoscopy/endoscopy. Patient is mostly wheelchair bound due to spinal stenosis but can walk 30-40 feet with a walker and take care of house work such as mopping from his wheelchair. He  denies cardiac complaints. Despite being in a wheelchair his METs are over 4 so will not order further cardiac testing. Discussed with Dr. Angelena Form who concurs. He will need a lovenox bridge with history of recurrent PE and has appt with coumadin clinic for this.  According to the Revised Cardiac Risk Index (RCRI), his Perioperative Risk of Major Cardiac Event is (%): 0.9  His Functional Capacity in METs is: 4.27 according  to the Duke Activity Status Index (DASI).     History of pulmonary embolism/chronic anti-coagulation management: He will need lifelong anti-coagulation. He is followed in our coumadin clinic  Lower extremity edema: Likely secondary to venous stasis disease and chronic DVT. Will continue coumadin for lifetime given his history of DVT, PE and immobility. He has an IVC filter in place. Only uses lasix about once a month.  HTN well controlled on cozaar and toprol.               Dispo:  No follow-ups on file.   Medication Adjustments/Labs and Tests Ordered: Current medicines are reviewed at length with the patient today.  Concerns regarding medicines are outlined above.  Tests Ordered: Orders Placed This Encounter  Procedures   EKG 12-Lead   Medication Changes: No orders of the defined types were placed in this encounter.  Sumner Boast, PA-C  02/20/2023 12:57 PM    [address], [place], [place]  [id number] Phone: [phone]; Fax: [phone]

## 2023-02-20 ENCOUNTER — Encounter: Payer: Self-pay | Admitting: Physician Assistant

## 2023-02-20 ENCOUNTER — Ambulatory Visit: Payer: Medicare (Managed Care) | Attending: Physician Assistant | Admitting: Physician Assistant

## 2023-02-20 VITALS — BP 132/78 | HR 64 | Ht 70.0 in | Wt 258.2 lb

## 2023-02-20 DIAGNOSIS — Z86711 Personal history of pulmonary embolism: Secondary | ICD-10-CM

## 2023-02-20 DIAGNOSIS — Z01818 Encounter for other preprocedural examination: Secondary | ICD-10-CM | POA: Diagnosis not present

## 2023-02-20 DIAGNOSIS — I10 Essential (primary) hypertension: Secondary | ICD-10-CM

## 2023-02-20 DIAGNOSIS — R600 Localized edema: Secondary | ICD-10-CM

## 2023-02-20 DIAGNOSIS — Z7901 Long term (current) use of anticoagulants: Secondary | ICD-10-CM | POA: Diagnosis not present

## 2023-02-20 NOTE — Telephone Encounter (Signed)
See office note from Cardiology today. They have schedule Lovenox bridging for patient prior to EGD/Colon.

## 2023-02-20 NOTE — Patient Instructions (Signed)
Medication Instructions:  Your physician recommends that you continue on your current medications as directed. Please refer to the Current Medication list given to you today.  *If you need a refill on your cardiac medications before your next appointment, please call your pharmacy*   Lab Work: None ordered   If you have labs (blood work) drawn today and your tests are completely normal, you will receive your results only by: MyChart Message (if you have MyChart) OR A paper copy in the mail If you have any lab test that is abnormal or we need to change your treatment, we will call you to review the results.   Testing/Procedures: None ordered    Follow-Up: At [facility], you and your health needs are our priority.  As part of our continuing mission to provide you with exceptional heart care, we have created designated Provider Care Teams.  These Care Teams include your primary Cardiologist (physician) and Advanced Practice Providers (APPs -  Physician Assistants and Nurse Practitioners) who all work together to provide you with the care you need, when you need it.  We recommend signing up for the patient portal called "MyChart".  Sign up information is provided on this After Visit Summary.  MyChart is used to connect with patients for Virtual Visits (Telemedicine).  Patients are able to view lab/test results, encounter notes, upcoming appointments, etc.  Non-urgent messages can be sent to your provider as well.   To learn more about what you can do with MyChart, go to [email or web address].    Your next appointment:   12 month(s)  Provider:   Christopher McAlhany, MD     Other Instructions   

## 2023-03-13 ENCOUNTER — Ambulatory Visit: Payer: Medicare (Managed Care) | Attending: Cardiovascular Disease | Admitting: *Deleted

## 2023-03-13 DIAGNOSIS — R600 Localized edema: Secondary | ICD-10-CM

## 2023-03-13 DIAGNOSIS — Z7901 Long term (current) use of anticoagulants: Secondary | ICD-10-CM | POA: Diagnosis not present

## 2023-03-13 DIAGNOSIS — Z86711 Personal history of pulmonary embolism: Secondary | ICD-10-CM | POA: Diagnosis not present

## 2023-03-13 LAB — POCT INR: INR: 1.6 — AB (ref 2.0–3.0)

## 2023-03-13 MED ORDER — ENOXAPARIN SODIUM 120 MG/0.8ML IJ SOSY: 120.0000 mg | PREFILLED_SYRINGE | Freq: Two times a day (BID) | INTRAMUSCULAR | 1 refills | Status: DC

## 2023-03-13 NOTE — Patient Instructions (Addendum)
  Description   Today take 1 tablet then continue taking warfarin 1/2 tablet daily except for 1 tablet on Tuesday, Thursday, and Saturday. Recheck INR 1 week post procedure/on lovenox. Coumadin Clinic [phone] or [phone]    03/19/23: Last dose of warfarin.  03/20/23: No warfarin or enoxaparin (Lovenox).  03/21/23: Inject enoxaparin  in the fatty abdominal tissue at least 2 inches from the belly button twice a day about 12 hours apart, 8am and 8pm rotate sites. No warfarin.  03/22/23: Inject enoxaparin in the fatty tissue every 12 hours, 8am and 8pm. No warfarin.  03/23/23: Inject enoxaparin in the fatty tissue every 12 hours, 8am and 8pm. No warfarin.  03/24/23: Inject enoxaparin in the fatty tissue in the morning at 8 am (No PM dose). No warfarin.  03/25/23: Procedure Day - No enoxaparin - Resume warfarin in the evening or as directed by doctor (take an extra half tablet with usual dose for 2 days then resume normal dose).  03/26/23: Resume enoxaparin inject in the fatty tissue every 12 hours and take warfarin  03/27/23: Inject enoxaparin in the fatty tissue every 12 hours and take warfarin  03/28/23: Inject enoxaparin in the fatty tissue every 12 hours and take warfarin  03/29/23: Inject enoxaparin in the fatty tissue every 12 hours and take warfarin  03/30/23: Inject enoxaparin in the fatty tissue every 12 hours and take warfarin  03/31/23: Inject enoxaparin in the fatty tissue every 12 hours and take warfarin  04/01/23: Inject enoxaparin in the fatty tissue and report to warfarin appt to check INR.

## 2023-03-14 NOTE — Telephone Encounter (Signed)
I have spoken to patient who indicates that he has been made aware of lovenox bridging prior to his procedure from cardiology and states that he is going to pick up his lovenox today from the pharmacy.

## 2023-03-18 ENCOUNTER — Telehealth: Payer: Self-pay | Admitting: Internal Medicine

## 2023-03-18 NOTE — Telephone Encounter (Signed)
Will await further communication from pt. He has cancelled the appt and states he will call us back if he can schedule with our office.

## 2023-03-18 NOTE — Telephone Encounter (Signed)
Spoke with pt and he is aware, states that does not make sense and he is going to look into it. Dr. Rhea Belton also notified. Dr. Rhea Belton do you want me to cancel the procedures?

## 2023-03-18 NOTE — Telephone Encounter (Signed)
Would give the patient a little time to investigate this but if we are indeed not in his medical network he will likely prefer to pick a GI provider in network for him If we are not in network procedure should be canceled; unless the patient agrees to pay out-of-pocket

## 2023-03-18 NOTE — Telephone Encounter (Signed)
Bonita Quin,  This patient has [facility], and I was just told by the insurance company we are OON. His insurance is good with [facility]. Would you please let the pt know?  Thank you.

## 2023-03-25 ENCOUNTER — Encounter: Payer: Medicare (Managed Care) | Admitting: Internal Medicine

## 2023-04-01 ENCOUNTER — Ambulatory Visit: Payer: Medicare (Managed Care)

## 2023-04-03 ENCOUNTER — Ambulatory Visit: Payer: Medicare (Managed Care) | Attending: Cardiology | Admitting: *Deleted

## 2023-04-03 DIAGNOSIS — Z86711 Personal history of pulmonary embolism: Secondary | ICD-10-CM

## 2023-04-03 DIAGNOSIS — Z7901 Long term (current) use of anticoagulants: Secondary | ICD-10-CM | POA: Diagnosis not present

## 2023-04-03 DIAGNOSIS — R600 Localized edema: Secondary | ICD-10-CM | POA: Diagnosis not present

## 2023-04-03 LAB — POCT INR: INR: 1.9 — AB (ref 2.0–3.0)

## 2023-04-03 NOTE — Patient Instructions (Signed)
Description   Today take 1 tablet then START taking warfarin 1 tablet daily except for 1/2 tablet on Monday, Wednesday, and Friday. Recheck INR 4 weeks. Coumadin Clinic [phone] or [phone]

## 2023-04-16 ENCOUNTER — Telehealth: Payer: Self-pay | Admitting: Cardiovascular Disease

## 2023-04-16 NOTE — Telephone Encounter (Signed)
Luiz Blare from [facility], Relations Manager called to say that they do not have a contract with [facility] but they do process out-of-network care. Says that claim will still be accepted for appts.

## 2023-05-01 ENCOUNTER — Ambulatory Visit: Payer: Medicare (Managed Care) | Attending: Cardiovascular Disease | Admitting: *Deleted

## 2023-05-01 DIAGNOSIS — R600 Localized edema: Secondary | ICD-10-CM | POA: Diagnosis not present

## 2023-05-01 DIAGNOSIS — Z86711 Personal history of pulmonary embolism: Secondary | ICD-10-CM | POA: Diagnosis not present

## 2023-05-01 DIAGNOSIS — Z7901 Long term (current) use of anticoagulants: Secondary | ICD-10-CM | POA: Diagnosis not present

## 2023-05-01 LAB — POCT INR: INR: 3 (ref 2.0–3.0)

## 2023-05-01 NOTE — Patient Instructions (Addendum)
Description   Continue taking warfarin 1 tablet daily except for 1/2 tablet on Monday, Wednesday, and Friday. Have something leafy green today. Recheck INR 1 week post procedure. Coumadin Clinic [phone] or [phone]    05/22/23: Last dose of warfarin.  05/23/23: No warfarin or enoxaparin (Lovenox).  05/24/23: Inject enoxaparin 120mg  in the fatty abdominal tissue at least 2 inches from the belly button twice a day about 12 hours apart, 8am and 8pm rotate sites. No warfarin.  05/25/23: Inject enoxaparin in the fatty tissue every 12 hours, 8am and 8pm. No warfarin.  05/26/23: Inject enoxaparin in the fatty tissue every 12 hours, 8am and 8pm. No warfarin.  05/27/23: Inject enoxaparin in the fatty tissue in the morning at 8 am (No PM dose). No warfarin.  05/28/23: Procedure Day - No enoxaparin - Resume warfarin in the evening or as directed by doctor (take an extra half tablet with usual dose for 2 days then resume normal dose).  05/29/23: Resume enoxaparin inject in the fatty tissue every 12 hours and take warfarin  05/30/23: Inject enoxaparin in the fatty tissue every 12 hours and take warfarin  05/31/23: Inject enoxaparin in the fatty tissue every 12 hours and take warfarin  06/01/23: Inject enoxaparin in the fatty tissue every 12 hours and take warfarin  06/02/23: Inject enoxaparin in the fatty tissue every 12 hours and take warfarin  06/03/23: Inject enoxaparin in the fatty tissue every 12 hours and take warfarin  06/04/23: warfarin appt to check INR.

## 2023-05-28 ENCOUNTER — Encounter: Payer: Self-pay | Admitting: Internal Medicine

## 2023-05-28 ENCOUNTER — Ambulatory Visit (AMBULATORY_SURGERY_CENTER): Payer: Medicare (Managed Care) | Admitting: Internal Medicine

## 2023-05-28 VITALS — BP 162/85 | HR 91 | Temp 98.9°F | Resp 14 | Ht 71.0 in | Wt 235.0 lb

## 2023-05-28 DIAGNOSIS — Z09 Encounter for follow-up examination after completed treatment for conditions other than malignant neoplasm: Secondary | ICD-10-CM

## 2023-05-28 DIAGNOSIS — D124 Benign neoplasm of descending colon: Secondary | ICD-10-CM | POA: Diagnosis not present

## 2023-05-28 DIAGNOSIS — K3189 Other diseases of stomach and duodenum: Secondary | ICD-10-CM | POA: Diagnosis not present

## 2023-05-28 DIAGNOSIS — Z86010 Personal history of colonic polyps: Secondary | ICD-10-CM

## 2023-05-28 DIAGNOSIS — R1319 Other dysphagia: Secondary | ICD-10-CM

## 2023-05-28 DIAGNOSIS — K2970 Gastritis, unspecified, without bleeding: Secondary | ICD-10-CM

## 2023-05-28 MED ORDER — SODIUM CHLORIDE 0.9 % IV SOLN
500.0000 mL | Freq: Once | INTRAVENOUS | Status: AC
Start: 2023-05-28 — End: ?

## 2023-05-28 NOTE — Progress Notes (Unsigned)
Called to room to assist during endoscopic procedure.  Patient ID and intended procedure confirmed with present staff. Received instructions for my participation in the procedure from the performing physician.  

## 2023-05-28 NOTE — Progress Notes (Unsigned)
A and O x3. Report to RN. Tolerated MAC anesthesia well.Teeth unchanged after procedure. 

## 2023-05-28 NOTE — Op Note (Signed)
[facility] Patient Name: Nicolas [name] Procedure Date: 05/28/2023 3:05 PM MRN: [id number] Endoscopist: Beverley Fiedler , MD, [id number] Age: 57 Referring MD:  Date of Birth: 1966/08/23 Gender: Male Account #: [id number] Procedure:                Colonoscopy Indications:              High risk colon cancer surveillance: Personal                            history of non-advanced adenomas, Last colonoscopy:                            December 2018 (2 adenomas) Medicines:                Monitored Anesthesia Care Procedure:                Pre-Anesthesia Assessment:                           - Prior to the procedure, a History and Physical                            was performed, and patient medications and                            allergies were reviewed. The patient's tolerance of                            previous anesthesia was also reviewed. The risks                            and benefits of the procedure and the sedation                            options and risks were discussed with the patient.                            All questions were answered, and informed consent                            was obtained. Prior Anticoagulants: The patient has                            taken Lovenox (enoxaparin), last dose was 1 day                            prior to procedure. ASA Grade Assessment: III - A                            patient with severe systemic disease. After                            reviewing the risks and benefits, the patient was  deemed in satisfactory condition to undergo the                            procedure.                           After obtaining informed consent, the colonoscope                            was passed under direct vision. Throughout the                            procedure, the patient's blood pressure, pulse, and                            oxygen saturations were monitored continuously. The                             Olympus CF-HQ190L ([id number]) Colonoscope was                            introduced through the anus and advanced to the                            cecum, identified by appendiceal orifice and                            ileocecal valve. The colonoscopy was performed                            without difficulty. The patient tolerated the                            procedure well. The quality of the bowel                            preparation was good. The ileocecal valve,                            appendiceal orifice, and rectum were photographed. Scope In: 3:30:14 PM Scope Out: 3:50:52 PM Scope Withdrawal Time: 0 hours 11 minutes 35 seconds  Total Procedure Duration: 0 hours 20 minutes 38 seconds  Findings:                 The perianal and digital rectal examinations were                            normal.                           A 4 mm polyp was found in the descending colon. The                            polyp was sessile. The polyp was removed with a  cold snare. Resection and retrieval were complete.                           The exam was otherwise without abnormality on                            direct and retroflexion views. Complications:            No immediate complications. Estimated Blood Loss:     Estimated blood loss: none. Impression:               - One 4 mm polyp in the descending colon, removed                            with a cold snare. Resected and retrieved.                           - The examination was otherwise normal on direct                            and retroflexion views. Recommendation:           - Patient has a contact number available for                            emergencies. The signs and symptoms of potential                            delayed complications were discussed with the                            patient. Return to normal activities tomorrow.                            Written discharge  instructions were provided to the                            patient.                           - Resume previous diet.                           - Continue present medications.                           - Resume Lovenox (enoxaparin) and warfarin at prior                            dose. Refer to Coumadin Clinic for further                            adjustment of therapy.                           - Await pathology results.                           -  Repeat colonoscopy is recommended for                            surveillance. The colonoscopy date will be                            determined after pathology results from today's                            exam become available for review. Beverley Fiedler, MD 05/28/2023 3:59:25 PM This report has been signed electronically.

## 2023-05-28 NOTE — Progress Notes (Signed)
GASTROENTEROLOGY PROCEDURE H&P NOTE   Primary Care Physician: Ellyn Hack, MD    Reason for Procedure:  Esophageal dysphagia and history of colon polyp  Plan:    EGD with possible dilation and colonoscopy  Patient is appropriate for endoscopic procedure(s) in the ambulatory ([facility]) setting.  The nature of the procedure, as well as the risks, benefits, and alternatives were carefully and thoroughly reviewed with the patient. Ample time for discussion and questions allowed. The patient understood, was satisfied, and agreed to proceed.     HPI: Nicolas [name] is a 57 y.o. male who presents for EGD with possible dilation and colonoscopy.  Medical history as below.  Tolerated the prep.  No recent chest pain or shortness of breath.  No abdominal pain today. Warfarin on hold x 5 days  Past Medical History:  Diagnosis Date   Arthritis    Cataract    Combined form OU   Clotting disorder (HCC)    pulmonary embolus, DVT   Depression    Diabetes mellitus without complication (HCC)    no meds, watching diet   DVT (deep venous thrombosis) (HCC)    Summer 2012   Hypertension    Hypertensive retinopathy    OU   Hypoxemia 05/23/2014   Neuromuscular disorder (HCC)    spinal stenosis.- pt walks with a walker short distance   Peripheral vascular disease (HCC)    PONV (postoperative nausea and vomiting)    Pulmonary embolism (HCC)    x 3 in 2005, 2008, 2010   Sleep apnea    does not wear a c-pap, pt. reports that he gave the CPAP back, denies that he has OSA    Spinal stenosis    Tubular adenoma of colon     Past Surgical History:  Procedure Laterality Date   ANTERIOR CERVICAL CORPECTOMY N/A 01/26/2019   Procedure: Cervical Four Corpectomy with Cervical Three-Four, Cervical Four-Five interbody fusion, prosthesis, explore old fusion, possible removal of old plate;  Surgeon: Tressie Stalker, MD;  Location: [facility] OR;  Service: Neurosurgery;  Laterality: N/A;  anterior approach    CHOLECYSTECTOMY N/A 05/24/2014   Procedure: LAPAROSCOPIC CHOLECYSTECTOMY;  Surgeon: Liz Malady, MD;  Location: [facility] OR;  Service: General;  Laterality: N/A;   ERCP N/A 05/25/2014   Procedure: ENDOSCOPIC RETROGRADE CHOLANGIOPANCREATOGRAPHY (ERCP);  Surgeon: Theda Belfast, MD;  Location: [facility] OR;  Service: Endoscopy;  Laterality: N/A;   ERCP N/A 07/16/2014   Procedure: ENDOSCOPIC RETROGRADE CHOLANGIOPANCREATOGRAPHY (ERCP);  Surgeon: Theda Belfast, MD;  Location: [facility];  Service: Endoscopy;  Laterality: N/A;   ivp filter  jan 2012   LUMBAR LAMINECTOMY/DECOMPRESSION MICRODISCECTOMY N/A 05/14/2019   Procedure: LAMINECTOMY AND FORAMINOTOMY THORACIC FIVE- THORACIC SIX, THORACIC ELEVEN- THORACIC THORACIC, LUMBAR THREE- LUMBAR FOUR;  Surgeon: Tressie Stalker, MD;  Location: [facility] OR;  Service: Neurosurgery;  Laterality: N/A;  posterior   LUMBAR LAMINECTOMY/DECOMPRESSION MICRODISCECTOMY N/A 11/11/2019   Procedure: REDO LUMBAR THREE- LUMBAR FOUR LAMINECTOMY/FORAMINOTOMY;  Surgeon: Tressie Stalker, MD;  Location: [facility] OR;  Service: Neurosurgery;  Laterality: N/A;   SPINE SURGERY  11/30/11   Total 6 back surgeries   SPINE SURGERY  12/2013   Dr. Orlie Pollen in [place], [place]   SPINE SURGERY     08/2003, cervial spine 2005, 2010 lower back, 2011 lower back, 2012 & 2013   UPPER GASTROINTESTINAL ENDOSCOPY     WISDOM TOOTH EXTRACTION     2 removed    Prior to Admission medications   Medication Sig Start Date End  Date Taking? Authorizing Provider  enoxaparin (LOVENOX) 120 MG/0.8ML injection Inject 0.8 mLs (120 mg total) into the skin every 12 (twelve) hours. 03/13/23  Yes Kathleene Hazel, MD  levothyroxine (SYNTHROID) 25 MCG tablet Take 1 tablet (25 mcg total) by mouth daily before breakfast. 07/03/21  Yes Lanier Prude, MD  losartan (COZAAR) 50 MG tablet TAKE 1 TABLET EVERY DAY 01/04/21  Yes Kathleene Hazel, MD  metoprolol succinate (TOPROL-XL) 50 MG 24 hr tablet TAKE 1 AND 1/2 TABLETS BY  MOUTH  ONCE DAILY 10/25/22  Yes Kathleene Hazel, MD  furosemide (LASIX) 40 MG tablet TAKE 1 TABLET EVERY DAY, NEED MD APPOINTMENT WITH CARDS FOR REFILLS 07/07/19   Kathleene Hazel, MD  Multiple Vitamin (MULTIVITAMIN WITH MINERALS) TABS tablet Take 1 tablet by mouth daily.     [provider]  potassium chloride SA (KLOR-CON M) 20 MEQ tablet TAKE 1 TABLET BY MOUTH  DAILY 10/25/22   Kathleene Hazel, MD  warfarin (COUMADIN) 10 MG tablet TAKE 1/2 TO 1 TABLET BY MOUTH  DAILY AS DIRECTED BY COUMADIN  CLINIC 09/03/22   Kathleene Hazel, MD    Current Outpatient Medications  Medication Sig Dispense Refill   enoxaparin (LOVENOX) 120 MG/0.8ML injection Inject 0.8 mLs (120 mg total) into the skin every 12 (twelve) hours. 16 mL 1   levothyroxine (SYNTHROID) 25 MCG tablet Take 1 tablet (25 mcg total) by mouth daily before breakfast. 90 tablet 2   losartan (COZAAR) 50 MG tablet TAKE 1 TABLET EVERY DAY 90 tablet 3   metoprolol succinate (TOPROL-XL) 50 MG 24 hr tablet TAKE 1 AND 1/2 TABLETS BY MOUTH  ONCE DAILY 135 tablet 1   furosemide (LASIX) 40 MG tablet TAKE 1 TABLET EVERY DAY, NEED MD APPOINTMENT WITH CARDS FOR REFILLS 20 tablet 0   Multiple Vitamin (MULTIVITAMIN WITH MINERALS) TABS tablet Take 1 tablet by mouth daily.      potassium chloride SA (KLOR-CON M) 20 MEQ tablet TAKE 1 TABLET BY MOUTH  DAILY 100 tablet 1   warfarin (COUMADIN) 10 MG tablet TAKE 1/2 TO 1 TABLET BY MOUTH  DAILY AS DIRECTED BY COUMADIN  CLINIC 60 tablet 5   Current Facility-Administered Medications  Medication Dose Route Frequency Provider Last Rate Last Admin   0.9 %  sodium chloride infusion  500 mL Intravenous Once Aliviya Schoeller, Carie Caddy, MD        Allergies as of 05/28/2023 - Review Complete 05/28/2023  Allergen Reaction Noted   Ace inhibitors Cough 03/26/2014   Lisinopril Cough 03/04/2012    Family History  Problem Relation Age of Onset   Lung cancer Mother 22   Diabetes Father     Prostate cancer Father    Hypertension Father    Congestive Heart Failure Father    Kidney disease Father    Hypertension Brother    Hypertension Brother    Diabetes Brother    Heart attack Neg Hx    Stroke Neg Hx    Colon cancer Neg Hx    Esophageal cancer Neg Hx    Pancreatic cancer Neg Hx    Rectal cancer Neg Hx    Stomach cancer Neg Hx     Social History   Socioeconomic History   Marital status: Single    Spouse name: Not on file   Number of children: 3   Years of education: 10   Highest education level: 10th grade  Occupational History   Occupation: Disability  Tobacco Use   Smoking  status: Former    Packs/day: 1.50    Years: 10.00    Additional pack years: 0.00    Total pack years: 15.00    Types: Cigarettes    Quit date: 04/26/2001    Years since quitting: 22.1   Smokeless tobacco: Never  Vaping Use   Vaping Use: Never used  Substance and Sexual Activity   Alcohol use: Yes    Alcohol/week: 1.0 standard drink of alcohol    Types: 1 Standard drinks or equivalent per week    Comment: very little- rare   Drug use: Yes    Types: Marijuana    Comment: daily- twice per day, last on 05/27/23   Sexual activity: Yes  Other Topics Concern   Not on file  Social History Narrative   Patient is single and lives at home, his son lives with him.   Patient has three children.   Patient is disabled.   Patient has a 10 grade education.   Patient is right-handed.   Patient drinks 1 or 2 sodas and tea per week.   Social Determinants of Health   Financial Resource Strain: Low Risk  (10/07/2017)   Overall Financial Resource Strain (CARDIA)    Difficulty of Paying Living Expenses: Not hard at all  Food Insecurity: No Food Insecurity (10/07/2017)   Hunger Vital Sign    Worried About Running Out of Food in the Last Year: Never true    Ran Out of Food in the Last Year: Never true  Transportation Needs: No Transportation Needs (10/07/2017)   PRAPARE - Doctor, general practice (Medical): No    Lack of Transportation (Non-Medical): No  Physical Activity: Sufficiently Active (10/07/2017)   Exercise Vital Sign    Days of Exercise per Week: 6 days    Minutes of Exercise per Session: 50 min  Stress: No Stress Concern Present (10/07/2017)   [employer] of Occupational Health - Occupational Stress Questionnaire    Feeling of Stress : Not at all  Social Connections: Somewhat Isolated (10/07/2017)   Social Connection and Isolation Panel [NHANES]    Frequency of Communication with Friends and Family: More than three times a week    Frequency of Social Gatherings with Friends and Family: More than three times a week    Attends Religious Services: Never    Database administrator or Organizations: Yes    Attends Engineer, structural: More than 4 times per year    Marital Status: Never married  Intimate Partner Violence: Not At Risk (10/07/2017)   Humiliation, Afraid, Rape, and Kick questionnaire    Fear of Current or Ex-Partner: No    Emotionally Abused: No    Physically Abused: No    Sexually Abused: No    Physical Exam: Vital signs in last 24 hours: @BP  (!) 174/92   Pulse 98   Temp 98.9 F (37.2 C)   Ht 5\' 11"  (1.803 m)   Wt 235 lb (106.6 kg)   SpO2 98%   BMI 32.78 kg/m  GEN: NAD EYE: Sclerae anicteric ENT: MMM CV: Non-tachycardic Pulm: CTA b/l GI: Soft, NT/ND NEURO:  Alert & Oriented x 3   Erick Blinks, MD [facility]  05/28/2023 3:12 PM

## 2023-05-28 NOTE — Op Note (Signed)
[facility] Patient Name: Nicolas [name] Procedure Date: 05/28/2023 3:10 PM MRN: [id number] Endoscopist: Beverley Fiedler , MD, [id number] Age: 57 Referring MD:  Date of Birth: 05-08-1966 Gender: Male Account #: [id number] Procedure:                Upper GI endoscopy Indications:              Dysphagia Medicines:                Monitored Anesthesia Care Procedure:                Pre-Anesthesia Assessment:                           - Prior to the procedure, a History and Physical                            was performed, and patient medications and                            allergies were reviewed. The patient's tolerance of                            previous anesthesia was also reviewed. The risks                            and benefits of the procedure and the sedation                            options and risks were discussed with the patient.                            All questions were answered, and informed consent                            was obtained. Prior Anticoagulants: The patient has                            taken Lovenox (enoxaparin), last dose was 1 day                            prior to procedure and warfarin 5 days before                            procedure. ASA Grade Assessment: III - A patient                            with severe systemic disease. After reviewing the                            risks and benefits, the patient was deemed in                            satisfactory condition to undergo the procedure.  After obtaining informed consent, the endoscope was                            passed under direct vision. Throughout the                            procedure, the patient's blood pressure, pulse, and                            oxygen saturations were monitored continuously. The                            GIF [id number] #[id number] was introduced through the                            mouth, and advanced to the second part of  duodenum.                            The upper GI endoscopy was accomplished without                            difficulty. The patient tolerated the procedure                            well. Scope In: Scope Out: Findings:                 In the proximal esophagus there was a small inlet                            patch without nodularity or visible abnormality.                           The upper third of the esophagus was mildly                            tortuous.                           The exam of the esophagus was otherwise normal.                           A TTS dilator was passed through the scope.                            Dilation with an 18-19-20 mm balloon dilator was                            performed to 20 mm in the middle third of the                            esophagus.                           Scattered mild inflammation characterized by  erythema was found in the gastric body and in the                            gastric antrum. Biopsies were taken with a cold                            forceps for histology and Helicobacter pylori                            testing.                           The examined duodenum was normal. Complications:            No immediate complications. Estimated Blood Loss:     Estimated blood loss: none. Estimated blood loss                            was minimal. Impression:               - No evidence of esophageal obstruction/narrowing.                            Dilation with 20 mm balloon                           - Mild gastritis. Biopsied.                           - Normal examined duodenum.                           - Dilation performed in the middle third of the                            esophagus. Recommendation:           - Patient has a contact number available for                            emergencies. The signs and symptoms of potential                            delayed complications were  discussed with the                            patient. Return to normal activities tomorrow.                            Written discharge instructions were provided to the                            patient.                           - Resume previous diet.                           -  Continue present medications.                           - Await pathology results.                           - If swallowing trouble persists after dilation                            then proceed with barium esophagram.                           - Resume Lovenox (enoxaparin) and warfarin today as                            instructed by anticoagulation clinic. Refer to                            managing physician for further adjustment of                            therapy.                           - See the other procedure note for documentation of                            additional recommendations. Beverley Fiedler, MD 05/28/2023 3:56:42 PM This report has been signed electronically.

## 2023-05-28 NOTE — Patient Instructions (Signed)
YOU HAD AN ENDOSCOPIC PROCEDURE TODAY AT THE [facility]:   Refer to the procedure report that was given to you for any specific questions about what was found during the examination.  If the procedure report does not answer your questions, please call your gastroenterologist to clarify.  If you requested that your care partner not be given the details of your procedure findings, then the procedure report has been included in a sealed envelope for you to review at your convenience later.  YOU SHOULD EXPECT: Some feelings of bloating in the abdomen. Passage of more gas than usual.  Walking can help get rid of the air that was put into your GI tract during the procedure and reduce the bloating. If you had a lower endoscopy (such as a colonoscopy or flexible sigmoidoscopy) you may notice spotting of blood in your stool or on the toilet paper. If you underwent a bowel prep for your procedure, you may not have a normal bowel movement for a few days.  Please Note:  You might notice some irritation and congestion in your nose or some drainage.  This is from the oxygen used during your procedure.  There is no need for concern and it should clear up in a day or so.  SYMPTOMS TO REPORT IMMEDIATELY:  Following lower endoscopy (colonoscopy or flexible sigmoidoscopy):  Excessive amounts of blood in the stool  Significant tenderness or worsening of abdominal pains  Swelling of the abdomen that is new, acute  Fever of 100F or higher  Following upper endoscopy (EGD)  Vomiting of blood or coffee ground material  New chest pain or pain under the shoulder blades  Painful or persistently difficult swallowing  New shortness of breath  Fever of 100F or higher  Black, tarry-looking stools  For urgent or emergent issues, a gastroenterologist can be reached at any hour by calling [phone]. Do not use MyChart messaging for urgent concerns.    DIET:  We do recommend a small meal at first, but  then you may proceed to your regular diet.  Drink plenty of fluids but you should avoid alcoholic beverages for 24 hours.  MEDICATIONS: Continue present medications. Resume Lovenox (enoxaparin) and Warfarin today as instructed by anticoagulation clinic. Refer to managing physician for further adjustment of therapy.  FOLLOW UP: If swallowing trouble persists after dilation, then proceed with barium esophagram. Await pathology results. Repeat colonoscopy is recommended for surveillance. The colonoscopy date will be determined after pathology results from today's exam become available for review.  Please see handouts given to you by your recovery nurse: Gastritis, Esophageal Stricture, Polyps.  Thank you for allowing [place] to provide for your healthcare needs today.  ACTIVITY:  You should plan to take it easy for the rest of today and you should NOT DRIVE or use heavy machinery until tomorrow (because of the sedation medicines used during the test).    FOLLOW UP: Our staff will call the number listed on your records the next business day following your procedure.  We will call around 7:15- 8:00 am to check on you and address any questions or concerns that you may have regarding the information given to you following your procedure. If we do not reach you, we will leave a message.     If any biopsies were taken you will be contacted by phone or by letter within the next 1-3 weeks.  Please call us at [phone] if you have not heard about the biopsies in 3  weeks.    SIGNATURES/CONFIDENTIALITY: You and/or your care partner have signed paperwork which will be entered into your electronic medical record.  These signatures attest to the fact that that the information above on your After Visit Summary has been reviewed and is understood.  Full responsibility of the confidentiality of this discharge information lies with you and/or your care-partner.

## 2023-05-29 ENCOUNTER — Telehealth: Payer: Self-pay | Admitting: *Deleted

## 2023-05-29 NOTE — Telephone Encounter (Signed)
  Follow up Call-     05/28/2023    3:02 PM  Call back number  Post procedure Call Back phone  # [phone]  Permission to leave phone message Yes     Patient questions:  Do you have a fever, pain , or abdominal swelling? No. Pain Score  0 *  Have you tolerated food without any problems? Yes.    Have you been able to return to your normal activities? Yes.    Do you have any questions about your discharge instructions: Diet   No. Medications  No. Follow up visit  No.  Do you have questions or concerns about your Care? No.  Actions: * If pain score is 4 or above: No action needed, pain <4.

## 2023-06-04 ENCOUNTER — Ambulatory Visit: Payer: Medicare (Managed Care)

## 2023-06-04 DIAGNOSIS — R600 Localized edema: Secondary | ICD-10-CM

## 2023-06-04 DIAGNOSIS — Z86711 Personal history of pulmonary embolism: Secondary | ICD-10-CM | POA: Diagnosis not present

## 2023-06-04 DIAGNOSIS — Z7901 Long term (current) use of anticoagulants: Secondary | ICD-10-CM | POA: Diagnosis not present

## 2023-06-04 LAB — POCT INR: INR: 2.2 (ref 2.0–3.0)

## 2023-06-04 NOTE — Patient Instructions (Signed)
Description   Stop Lovenox injections. Continue taking warfarin 1 tablet daily except for 1/2 tablet on Mondays, Wednesdays, and Fridays.  Have something leafy green today. Recheck INR in 4 weeks. [facility] [phone] or [phone]

## 2023-06-05 ENCOUNTER — Encounter: Payer: Self-pay | Admitting: Internal Medicine

## 2023-06-11 ENCOUNTER — Other Ambulatory Visit: Payer: Self-pay | Admitting: Cardiovascular Disease

## 2023-07-02 ENCOUNTER — Ambulatory Visit: Payer: Medicare (Managed Care) | Attending: Internal Medicine

## 2023-07-02 DIAGNOSIS — Z86711 Personal history of pulmonary embolism: Secondary | ICD-10-CM

## 2023-07-02 DIAGNOSIS — Z7901 Long term (current) use of anticoagulants: Secondary | ICD-10-CM | POA: Diagnosis not present

## 2023-07-02 DIAGNOSIS — R600 Localized edema: Secondary | ICD-10-CM | POA: Diagnosis not present

## 2023-07-02 LAB — POCT INR: INR: 3 (ref 2.0–3.0)

## 2023-07-02 NOTE — Patient Instructions (Signed)
Continue taking warfarin 1 tablet daily except for 1/2 tablet on Mondays, Wednesdays, and Fridays.  Recheck INR in 6 weeks. [facility]in Clinic [phone] or [phone]

## 2023-08-05 ENCOUNTER — Other Ambulatory Visit: Payer: Self-pay | Admitting: *Deleted

## 2023-08-05 DIAGNOSIS — I872 Venous insufficiency (chronic) (peripheral): Secondary | ICD-10-CM

## 2023-08-06 ENCOUNTER — Ambulatory Visit (HOSPITAL_COMMUNITY)
Admission: RE | Admit: 2023-08-06 | Discharge: 2023-08-06 | Disposition: A | Discharge status: Home / Self Care | Payer: Medicare (Managed Care) | Source: Ambulatory Visit | Attending: Vascular Surgery | Admitting: Vascular Surgery

## 2023-08-06 DIAGNOSIS — I872 Venous insufficiency (chronic) (peripheral): Secondary | ICD-10-CM | POA: Insufficient documentation

## 2023-08-08 ENCOUNTER — Ambulatory Visit (INDEPENDENT_AMBULATORY_CARE_PROVIDER_SITE_OTHER): Payer: Medicare (Managed Care) | Admitting: Vascular Surgery

## 2023-08-08 ENCOUNTER — Encounter: Payer: Self-pay | Admitting: Vascular Surgery

## 2023-08-08 VITALS — BP 123/76 | HR 58 | Temp 98.2°F | Resp 20 | Ht 71.0 in | Wt 235.0 lb

## 2023-08-08 DIAGNOSIS — I868 Varicose veins of other specified sites: Secondary | ICD-10-CM | POA: Diagnosis not present

## 2023-08-08 NOTE — Progress Notes (Signed)
Patient ID: Nicolas [name], male   DOB: December 28, 1965, 57 y.o.   MRN: [id number]  Reason for Consult: New Patient (Initial Visit)   Referred by Ellyn Hack, MD  Subjective:     HPI:  Nicolas [name] is a 57 y.o. male history of spinal stenosis and has multiple history of CP and thrombosis of pulmonary emboli.  He is on chronic Coumadin therapy.  He thinks that he had a filter placed around 2015 likely at [place] and [place].  He was given the option to remove the filter repeated he like to be treated.  Over the past couple weeks he has noted large veins protruding on his abdomen.  Has not had any bleeding or clotting issues with his veins he has no discomfort he has just been concerned about the appearance.  He states that when he lays flat these veins disappear.  He really does not have any varicose veins of his lower extremities.  He is mostly nonambulatory due to spinal stenosis but he can walk a few steps with the help of a walker.  He does have chronic swelling in the lower extremities although this is mild.  Past Medical History:  Diagnosis Date   Arthritis    Cataract    Combined form OU   Clotting disorder (HCC)    pulmonary embolus, DVT   Depression    Diabetes mellitus without complication (HCC)    no meds, watching diet   DVT (deep venous thrombosis) (HCC)    Summer 2012   Hypertension    Hypertensive retinopathy    OU   Hypoxemia 05/23/2014   Neuromuscular disorder (HCC)    spinal stenosis.- pt walks with a walker short distance   Peripheral vascular disease (HCC)    PONV (postoperative nausea and vomiting)    Pulmonary embolism (HCC)    x 3 in 2005, 2008, 2010   Sleep apnea    does not wear a c-pap, pt. reports that he gave the CPAP back, denies that he has OSA    Spinal stenosis    Tubular adenoma of colon    Family History  Problem Relation Age of Onset   Lung cancer Mother 63   Diabetes Father    Prostate cancer Father    Hypertension Father     Congestive Heart Failure Father    Kidney disease Father    Hypertension Brother    Hypertension Brother    Diabetes Brother    Heart attack Neg Hx    Stroke Neg Hx    Colon cancer Neg Hx    Esophageal cancer Neg Hx    Pancreatic cancer Neg Hx    Rectal cancer Neg Hx    Stomach cancer Neg Hx    Past Surgical History:  Procedure Laterality Date   ANTERIOR CERVICAL CORPECTOMY N/A 01/26/2019   Procedure: Cervical Four Corpectomy with Cervical Three-Four, Cervical Four-Five interbody fusion, prosthesis, explore old fusion, possible removal of old plate;  Surgeon: Tressie Stalker, MD;  Location: [facility] OR;  Service: Neurosurgery;  Laterality: N/A;  anterior approach   CHOLECYSTECTOMY N/A 05/24/2014   Procedure: LAPAROSCOPIC CHOLECYSTECTOMY;  Surgeon: Liz Malady, MD;  Location: [facility] OR;  Service: General;  Laterality: N/A;   ERCP N/A 05/25/2014   Procedure: ENDOSCOPIC RETROGRADE CHOLANGIOPANCREATOGRAPHY (ERCP);  Surgeon: Theda Belfast, MD;  Location: [facility] OR;  Service: Endoscopy;  Laterality: N/A;   ERCP N/A 07/16/2014   Procedure: ENDOSCOPIC RETROGRADE CHOLANGIOPANCREATOGRAPHY (ERCP);  Surgeon: Theda Belfast,  MD;  Location: [facility] ENDOSCOPY;  Service: Endoscopy;  Laterality: N/A;   ivp filter  jan 2012   LUMBAR LAMINECTOMY/DECOMPRESSION MICRODISCECTOMY N/A 05/14/2019   Procedure: LAMINECTOMY AND FORAMINOTOMY THORACIC FIVE- THORACIC SIX, THORACIC ELEVEN- THORACIC THORACIC, LUMBAR THREE- LUMBAR FOUR;  Surgeon: Tressie Stalker, MD;  Location: [facility] OR;  Service: Neurosurgery;  Laterality: N/A;  posterior   LUMBAR LAMINECTOMY/DECOMPRESSION MICRODISCECTOMY N/A 11/11/2019   Procedure: REDO LUMBAR THREE- LUMBAR FOUR LAMINECTOMY/FORAMINOTOMY;  Surgeon: Tressie Stalker, MD;  Location: [facility] OR;  Service: Neurosurgery;  Laterality: N/A;   SPINE SURGERY  11/30/11   Total 6 back surgeries   SPINE SURGERY  12/2013   Dr. Orlie Pollen in [place], [place]   SPINE SURGERY     08/2003, cervial spine 2005, 2010 lower back,  2011 lower back, 2012 & 2013   UPPER GASTROINTESTINAL ENDOSCOPY     WISDOM TOOTH EXTRACTION     2 removed    Short Social History:  Social History   Tobacco Use   Smoking status: Former    Current packs/day: 0.00    Average packs/day: 1.5 packs/day for 10.0 years (15.0 ttl pk-yrs)    Types: Cigarettes    Start date: 04/27/1991    Quit date: 04/26/2001    Years since quitting: 22.2   Smokeless tobacco: Never  Substance Use Topics   Alcohol use: Yes    Alcohol/week: 1.0 standard drink of alcohol    Types: 1 Standard drinks or equivalent per week    Comment: very little- rare    Allergies  Allergen Reactions   Ace Inhibitors Cough   Lisinopril Cough    Current Outpatient Medications  Medication Sig Dispense Refill   atorvastatin (LIPITOR) 40 MG tablet Take 40 mg by mouth daily.     icosapent Ethyl (VASCEPA) 1 g capsule Take 2 g by mouth 2 (two) times daily.     TRINTELLIX 10 MG TABS tablet Take 10 mg by mouth daily.     furosemide (LASIX) 40 MG tablet TAKE 1 TABLET EVERY DAY, NEED MD APPOINTMENT WITH CARDS FOR REFILLS 20 tablet 0   levothyroxine (SYNTHROID) 25 MCG tablet Take 1 tablet (25 mcg total) by mouth daily before breakfast. 90 tablet 2   losartan (COZAAR) 50 MG tablet TAKE 1 TABLET EVERY DAY 90 tablet 3   metoprolol succinate (TOPROL-XL) 50 MG 24 hr tablet TAKE ONE AND ONE-HALF TABLETS BY MOUTH ONCE DAILY 135 tablet 3   Multiple Vitamin (MULTIVITAMIN WITH MINERALS) TABS tablet Take 1 tablet by mouth daily.      potassium chloride SA (KLOR-CON M) 20 MEQ tablet TAKE 1 TABLET BY MOUTH DAILY 100 tablet 2   warfarin (COUMADIN) 10 MG tablet TAKE 1/2 TO 1 TABLET BY MOUTH  DAILY AS DIRECTED BY COUMADIN  CLINIC 60 tablet 5   Current Facility-Administered Medications  Medication Dose Route Frequency Provider Last Rate Last Admin   0.9 %  sodium chloride infusion  500 mL Intravenous Once Pyrtle, Carie Caddy, MD        Review of Systems  Constitutional:  Constitutional  negative. HENT: HENT negative.  Eyes: Eyes negative.  Respiratory: Respiratory negative.  Cardiovascular: Positive for leg swelling.  GI: Gastrointestinal negative.  Musculoskeletal: Musculoskeletal negative.  Skin: Skin negative.  Neurological: Positive for focal weakness.  Hematologic: Hematologic/lymphatic negative.  Psychiatric: Psychiatric negative.        Objective:  Objective  Vitals:   08/08/23 1122  BP: 123/76  Pulse: (!) 58  Resp: 20  Temp: 98.2 F (36.8 C)  SpO2: 98%     Physical Exam HENT:     Head: Normocephalic.     Nose: Nose normal.  Eyes:     Pupils: Pupils are equal, round, and reactive to light.  Cardiovascular:     Rate and Rhythm: Normal rate.     Pulses: Normal pulses.  Pulmonary:     Effort: Pulmonary effort is normal.  Abdominal:     Comments: Large varicosities across his abdomen  Musculoskeletal:     Cervical back: Normal range of motion and neck supple.     Right lower leg: Edema present.     Left lower leg: Edema present.  Skin:    General: Skin is warm.     Capillary Refill: Capillary refill takes less than 2 seconds.  Neurological:     General: No focal deficit present.     Mental Status: He is alert.  Psychiatric:        Mood and Affect: Mood normal.        Thought Content: Thought content normal.        Judgment: Judgment normal.     Data: Venous Reflux Times  +--------------+---------+------+-----------+------------+--------+  RIGHT        Reflux NoRefluxReflux TimeDiameter cmsComments                          Yes                                   +--------------+---------+------+-----------+------------+--------+  CFV                    yes   >1 second                       +--------------+---------+------+-----------+------------+--------+  FV mid                  yes   >1 second                       +--------------+---------+------+-----------+------------+--------+  Popliteal               yes   >1 second                       +--------------+---------+------+-----------+------------+--------+  GSV at SFJ         yes    >500 ms     0.82              +--------------+---------+------+-----------+------------+--------+  GSV prox thighno                            0.75              +--------------+---------+------+-----------+------------+--------+  GSV mid thigh    yes    >500 ms      0.59              +--------------+---------+------+-----------+------------+--------+  GSV dist thighno                              0.64              +--------------+---------+------+-----------+------------+--------+  GSV at knee   no  0.61              +--------------+---------+------+-----------+------------+--------+  GSV prox calf       yes    >500 ms     0.54              +--------------+---------+------+-----------+------------+--------+  GSV mid calf         yes    >500 ms     0.48              +--------------+---------+------+-----------+------------+--------+  SSV Pop Fossa no                            0.31              +--------------+---------+------+-----------+------------+--------+  SSV prox calf no                               0.26              +--------------+---------+------+-----------+------------+--------+  SSV mid calf  no                               0.37              +--------------+---------+------+-----------+------------+--------+  AASV O        no                                 0.34              +--------------+---------+------+-----------+------------+--------+  AASV P        no                                 0.30              +--------------+---------+------+-----------+------------+--------+         Summary:  Right:  - No evidence of obvious deep vein thrombosis seen in the right lower  extremity, from the common femoral through the  popliteal veins.  - Venous reflux is noted in the right common femoral vein.  - Venous reflux is noted in the right sapheno-femoral junction.  - Venous reflux is noted in the right greater saphenous vein in the thigh.  - Venous reflux is noted in the right greater saphenous vein in the calf.  - Venous reflux is noted in the right femoral vein.  - Venous reflux is noted in the right popliteal vein.      Assessment/Plan:    57 year old male with multiple histories of PEs and DVTs maintained on Coumadin.  He now has new onset varicosities of his abdomen without any history of liver failure.  According to his history he has a filter in place which I suspect may be thrombosed.  Patient is really not very symptomatic and we discussed his options being continued monitoring versus CT venogram.  He is elected for CT venogram and I will see him back after in the next 4 to 6 weeks.     Maeola Harman MD [facility]

## 2023-08-09 ENCOUNTER — Other Ambulatory Visit: Payer: Self-pay

## 2023-08-09 DIAGNOSIS — I868 Varicose veins of other specified sites: Secondary | ICD-10-CM

## 2023-08-13 ENCOUNTER — Ambulatory Visit: Payer: Medicare (Managed Care)

## 2023-08-26 ENCOUNTER — Ambulatory Visit (HOSPITAL_COMMUNITY): Payer: Medicare (Managed Care)

## 2023-08-30 NOTE — Addendum Note (Signed)
Addended by: Leilani Able, Navada Osterhout A on: 08/30/2023 01:38 PM   Modules accepted: Orders

## 2023-09-04 ENCOUNTER — Ambulatory Visit: Payer: Medicare (Managed Care) | Admitting: Vascular Surgery

## 2023-09-11 ENCOUNTER — Other Ambulatory Visit: Payer: Self-pay | Admitting: Cardiovascular Disease

## 2023-09-11 DIAGNOSIS — Z7901 Long term (current) use of anticoagulants: Secondary | ICD-10-CM

## 2023-11-06 ENCOUNTER — Telehealth: Payer: Self-pay

## 2023-11-06 NOTE — Telephone Encounter (Addendum)
Received call from pt's PCP, Dr Sherryll Burger. Pt's INR is now being managed by PCP. Dr Sherryll Burger states pt is having a difficult time keeping INR within therapeutic range and asking if there was a reason pt could not be switched to an alternative anticoagulant. Referred question to Phillips Hay, PharmD who states no contraindication to Eliquis. Pt was probably started on Warfarin on 2012 due to the cost of alternative anticoagulants. Dr Sherryll Burger states if pt is able to afford Eliquis he will discontinue Warfarin and switch pt to Eliquis 2.5mg  BID.

## 2024-03-11 ENCOUNTER — Other Ambulatory Visit: Payer: Self-pay

## 2024-03-11 MED ORDER — POTASSIUM CHLORIDE CRYS ER 20 MEQ PO TBCR: EXTENDED_RELEASE_TABLET | ORAL | 0 refills | Status: DC

## 2024-04-06 ENCOUNTER — Other Ambulatory Visit: Payer: Self-pay | Admitting: *Deleted

## 2024-04-06 MED ORDER — POTASSIUM CHLORIDE CRYS ER 20 MEQ PO TBCR: EXTENDED_RELEASE_TABLET | ORAL | 0 refills | Status: AC

## 2024-05-06 ENCOUNTER — Other Ambulatory Visit: Payer: Self-pay | Admitting: *Deleted

## 2024-05-06 MED ORDER — METOPROLOL SUCCINATE ER 50 MG PO TB24: ORAL_TABLET | ORAL | 0 refills | Status: DC

## 2024-08-25 ENCOUNTER — Other Ambulatory Visit: Payer: Self-pay | Admitting: Family Medicine

## 2024-08-25 DIAGNOSIS — R209 Unspecified disturbances of skin sensation: Secondary | ICD-10-CM

## 2024-09-07 ENCOUNTER — Other Ambulatory Visit: Payer: Self-pay | Admitting: Cardiovascular Disease

## 2024-09-10 MED ORDER — METOPROLOL SUCCINATE ER 50 MG PO TB24: ORAL_TABLET | ORAL | 0 refills | Status: AC

## 2024-09-29 ENCOUNTER — Other Ambulatory Visit: Payer: Self-pay | Admitting: Cardiovascular Disease
